# Patient Record
Sex: Female | Born: 1950
Health system: Southern US, Community
[De-identification: ages and names within clinical notes are randomized; demographics above are authoritative.]

## PROBLEM LIST (undated history)

## (undated) DIAGNOSIS — K219 Gastro-esophageal reflux disease without esophagitis: Secondary | ICD-10-CM

## (undated) DIAGNOSIS — E785 Hyperlipidemia, unspecified: Secondary | ICD-10-CM

## (undated) DIAGNOSIS — M25862 Other specified joint disorders, left knee: Secondary | ICD-10-CM

## (undated) DIAGNOSIS — G20A1 Parkinson's disease without dyskinesia, without mention of fluctuations: Secondary | ICD-10-CM

## (undated) DIAGNOSIS — C801 Malignant (primary) neoplasm, unspecified: Secondary | ICD-10-CM

## (undated) DIAGNOSIS — F0390 Unspecified dementia without behavioral disturbance: Secondary | ICD-10-CM

## (undated) DIAGNOSIS — G20 Parkinson's disease: Secondary | ICD-10-CM

## (undated) HISTORY — PX: NASAL SEPTUM SURGERY: SHX37

## (undated) HISTORY — DX: Hyperlipidemia, unspecified: E78.5

## (undated) HISTORY — DX: Other specified joint disorders, left knee: M25.862

## (undated) HISTORY — PX: BUNIONECTOMY: SHX129

## (undated) HISTORY — PX: RETINAL DETACHMENT SURGERY: SHX105

## (undated) HISTORY — PX: BACK SURGERY: SHX140

---

## 2006-04-02 ENCOUNTER — Ambulatory Visit: Payer: Self-pay | Admitting: Ophthalmology

## 2006-08-18 ENCOUNTER — Ambulatory Visit: Payer: Self-pay | Admitting: Ophthalmology

## 2007-02-09 ENCOUNTER — Ambulatory Visit: Payer: Self-pay | Admitting: Ophthalmology

## 2007-06-08 ENCOUNTER — Ambulatory Visit: Payer: Self-pay | Admitting: Ophthalmology

## 2007-11-07 ENCOUNTER — Ambulatory Visit: Payer: Self-pay | Admitting: Family Medicine

## 2009-01-11 ENCOUNTER — Ambulatory Visit: Payer: Self-pay | Admitting: Family Medicine

## 2009-03-07 ENCOUNTER — Ambulatory Visit: Payer: Self-pay

## 2010-06-04 ENCOUNTER — Ambulatory Visit: Payer: Self-pay | Admitting: Ophthalmology

## 2010-12-16 ENCOUNTER — Ambulatory Visit: Payer: Self-pay | Admitting: Unknown Physician Specialty

## 2011-02-17 ENCOUNTER — Ambulatory Visit: Payer: Self-pay

## 2011-02-24 ENCOUNTER — Ambulatory Visit: Payer: Self-pay

## 2011-02-26 LAB — PATHOLOGY REPORT

## 2011-03-09 ENCOUNTER — Ambulatory Visit: Payer: Self-pay | Admitting: Gastroenterology

## 2011-03-10 LAB — PATHOLOGY REPORT

## 2012-06-06 DIAGNOSIS — G20A1 Parkinson's disease without dyskinesia, without mention of fluctuations: Secondary | ICD-10-CM | POA: Insufficient documentation

## 2012-06-06 DIAGNOSIS — G20 Parkinson's disease: Secondary | ICD-10-CM | POA: Insufficient documentation

## 2012-06-06 DIAGNOSIS — E7800 Pure hypercholesterolemia, unspecified: Secondary | ICD-10-CM | POA: Insufficient documentation

## 2012-06-06 DIAGNOSIS — F32A Depression, unspecified: Secondary | ICD-10-CM | POA: Insufficient documentation

## 2012-06-06 DIAGNOSIS — F329 Major depressive disorder, single episode, unspecified: Secondary | ICD-10-CM | POA: Insufficient documentation

## 2012-06-06 DIAGNOSIS — K635 Polyp of colon: Secondary | ICD-10-CM | POA: Insufficient documentation

## 2012-06-06 DIAGNOSIS — H3320 Serous retinal detachment, unspecified eye: Secondary | ICD-10-CM | POA: Insufficient documentation

## 2012-06-06 DIAGNOSIS — F419 Anxiety disorder, unspecified: Secondary | ICD-10-CM | POA: Insufficient documentation

## 2012-06-08 DIAGNOSIS — H9190 Unspecified hearing loss, unspecified ear: Secondary | ICD-10-CM | POA: Insufficient documentation

## 2012-07-10 ENCOUNTER — Ambulatory Visit: Payer: Self-pay | Admitting: Oncology

## 2012-07-15 ENCOUNTER — Ambulatory Visit: Payer: Self-pay | Admitting: Unknown Physician Specialty

## 2012-07-25 ENCOUNTER — Ambulatory Visit: Payer: Self-pay | Admitting: Unknown Physician Specialty

## 2012-07-26 ENCOUNTER — Ambulatory Visit: Payer: Self-pay | Admitting: Unknown Physician Specialty

## 2012-07-28 ENCOUNTER — Ambulatory Visit: Payer: Self-pay | Admitting: Oncology

## 2012-07-28 LAB — PATHOLOGY REPORT

## 2012-08-05 ENCOUNTER — Ambulatory Visit: Payer: Self-pay | Admitting: Oncology

## 2012-08-10 ENCOUNTER — Ambulatory Visit: Payer: Self-pay | Admitting: Oncology

## 2012-08-24 LAB — COMPREHENSIVE METABOLIC PANEL
Albumin: 3.7 g/dL (ref 3.4–5.0)
Alkaline Phosphatase: 113 U/L (ref 50–136)
Anion Gap: 5 — ABNORMAL LOW (ref 7–16)
BUN: 15 mg/dL (ref 7–18)
Bilirubin,Total: 0.6 mg/dL (ref 0.2–1.0)
Calcium, Total: 8.9 mg/dL (ref 8.5–10.1)
Chloride: 105 mmol/L (ref 98–107)
Co2: 31 mmol/L (ref 21–32)
Creatinine: 0.72 mg/dL (ref 0.60–1.30)
EGFR (African American): >60
EGFR (Non-African Amer.): >60
Glucose: 108 mg/dL — ABNORMAL HIGH (ref 65–99)
Osmolality: 283 (ref 275–301)
Potassium: 4.3 mmol/L (ref 3.5–5.1)
SGOT(AST): 21 U/L (ref 15–37)
SGPT (ALT): 16 U/L (ref 12–78)
Sodium: 141 mmol/L (ref 136–145)
Total Protein: 7 g/dL (ref 6.4–8.2)

## 2012-08-24 LAB — CBC CANCER CENTER
Basophil: 1 %
Eosinophil: 2 %
HCT: 45.2 % (ref 35.0–47.0)
HGB: 14.8 g/dL (ref 12.0–16.0)
Lymphocytes: 35 %
MCH: 28.9 pg (ref 26.0–34.0)
MCHC: 32.8 g/dL (ref 32.0–36.0)
MCV: 88 fL (ref 80–100)
Monocytes: 9 %
Platelet: 165 x10 3/mm (ref 150–440)
RBC: 5.13 10*6/uL (ref 3.80–5.20)
RDW: 13.1 % (ref 11.5–14.5)
Segmented Neutrophils: 53 %
WBC: 5.6 x10 3/mm (ref 3.6–11.0)

## 2012-08-31 LAB — CBC CANCER CENTER
Basophil #: 0.1 x10 3/mm (ref 0.0–0.1)
Basophil %: 0.9 %
Eosinophil #: 0.2 x10 3/mm (ref 0.0–0.7)
Eosinophil %: 3.4 %
HCT: 43 % (ref 35.0–47.0)
HGB: 14.5 g/dL (ref 12.0–16.0)
Lymphocyte #: 1.3 x10 3/mm (ref 1.0–3.6)
Lymphocyte %: 19.8 %
MCH: 29.5 pg (ref 26.0–34.0)
MCHC: 33.7 g/dL (ref 32.0–36.0)
MCV: 88 fL (ref 80–100)
Monocyte #: 0.8 x10 3/mm (ref 0.2–0.9)
Monocyte %: 12.4 %
Neutrophil #: 4 x10 3/mm (ref 1.4–6.5)
Neutrophil %: 63.5 %
Platelet: 171 x10 3/mm (ref 150–440)
RBC: 4.9 10*6/uL (ref 3.80–5.20)
RDW: 13.1 % (ref 11.5–14.5)
WBC: 6.4 x10 3/mm (ref 3.6–11.0)

## 2012-09-07 LAB — CBC CANCER CENTER
Basophil #: 0.1 x10 3/mm (ref 0.0–0.1)
Basophil %: 1.1 %
Eosinophil #: 0.1 x10 3/mm (ref 0.0–0.7)
Eosinophil %: 1.7 %
HCT: 44.3 % (ref 35.0–47.0)
HGB: 14.7 g/dL (ref 12.0–16.0)
Lymphocyte #: 1.3 x10 3/mm (ref 1.0–3.6)
Lymphocyte %: 15.8 %
MCH: 29.3 pg (ref 26.0–34.0)
MCHC: 33.2 g/dL (ref 32.0–36.0)
MCV: 88 fL (ref 80–100)
Monocyte #: 0.5 x10 3/mm (ref 0.2–0.9)
Monocyte %: 5.8 %
Neutrophil #: 6.3 x10 3/mm (ref 1.4–6.5)
Neutrophil %: 75.6 %
Platelet: 165 x10 3/mm (ref 150–440)
RBC: 5.02 10*6/uL (ref 3.80–5.20)
RDW: 13.4 % (ref 11.5–14.5)
WBC: 8.3 x10 3/mm (ref 3.6–11.0)

## 2012-09-10 ENCOUNTER — Ambulatory Visit: Payer: Self-pay | Admitting: Oncology

## 2012-09-14 LAB — CBC CANCER CENTER
Basophil #: 0 x10 3/mm (ref 0.0–0.1)
Basophil %: 0.6 %
Eosinophil #: 0.2 x10 3/mm (ref 0.0–0.7)
Eosinophil %: 2.6 %
HCT: 45.7 % (ref 35.0–47.0)
HGB: 15.1 g/dL (ref 12.0–16.0)
Lymphocyte #: 1.3 x10 3/mm (ref 1.0–3.6)
Lymphocyte %: 16.6 %
MCH: 29.3 pg (ref 26.0–34.0)
MCHC: 33.1 g/dL (ref 32.0–36.0)
MCV: 89 fL (ref 80–100)
Monocyte #: 0.7 x10 3/mm (ref 0.2–0.9)
Monocyte %: 9.3 %
Neutrophil #: 5.5 x10 3/mm (ref 1.4–6.5)
Neutrophil %: 70.9 %
Platelet: 192 x10 3/mm (ref 150–440)
RBC: 5.17 10*6/uL (ref 3.80–5.20)
RDW: 13.1 % (ref 11.5–14.5)
WBC: 7.7 x10 3/mm (ref 3.6–11.0)

## 2012-10-08 ENCOUNTER — Ambulatory Visit: Payer: Self-pay | Admitting: Oncology

## 2012-10-26 LAB — CBC CANCER CENTER
Basophil #: 0.2 x10 3/mm — ABNORMAL HIGH (ref 0.0–0.1)
Basophil %: 3.2 %
Eosinophil #: 0.2 x10 3/mm (ref 0.0–0.7)
Eosinophil %: 3.4 %
HCT: 43.1 % (ref 35.0–47.0)
HGB: 14.4 g/dL (ref 12.0–16.0)
Lymphocyte #: 1 x10 3/mm (ref 1.0–3.6)
Lymphocyte %: 21.2 %
MCH: 29.4 pg (ref 26.0–34.0)
MCHC: 33.5 g/dL (ref 32.0–36.0)
MCV: 88 fL (ref 80–100)
Monocyte #: 0.5 x10 3/mm (ref 0.2–0.9)
Monocyte %: 9.7 %
Neutrophil #: 3.1 x10 3/mm (ref 1.4–6.5)
Neutrophil %: 62.5 %
Platelet: 179 x10 3/mm (ref 150–440)
RBC: 4.91 10*6/uL (ref 3.80–5.20)
RDW: 13.4 % (ref 11.5–14.5)
WBC: 4.9 x10 3/mm (ref 3.6–11.0)

## 2012-10-26 LAB — COMPREHENSIVE METABOLIC PANEL
Albumin: 3.6 g/dL (ref 3.4–5.0)
Alkaline Phosphatase: 92 U/L (ref 50–136)
Anion Gap: 7 (ref 7–16)
BUN: 18 mg/dL (ref 7–18)
Bilirubin,Total: 0.4 mg/dL (ref 0.2–1.0)
Calcium, Total: 8.5 mg/dL (ref 8.5–10.1)
Chloride: 106 mmol/L (ref 98–107)
Co2: 29 mmol/L (ref 21–32)
Creatinine: 0.84 mg/dL (ref 0.60–1.30)
EGFR (African American): >60
EGFR (Non-African Amer.): >60
Glucose: 101 mg/dL — ABNORMAL HIGH (ref 65–99)
Osmolality: 285 (ref 275–301)
Potassium: 3.7 mmol/L (ref 3.5–5.1)
SGOT(AST): 21 U/L (ref 15–37)
SGPT (ALT): 12 U/L (ref 12–78)
Sodium: 142 mmol/L (ref 136–145)
Total Protein: 6.7 g/dL (ref 6.4–8.2)

## 2012-10-26 LAB — SEDIMENTATION RATE: Erythrocyte Sed Rate: 1 mm/hr (ref 0–30)

## 2012-11-08 ENCOUNTER — Ambulatory Visit: Payer: Self-pay | Admitting: Oncology

## 2012-11-09 ENCOUNTER — Ambulatory Visit: Payer: Self-pay | Admitting: Oncology

## 2012-11-15 LAB — COMPREHENSIVE METABOLIC PANEL
Albumin: 3.7 g/dL (ref 3.4–5.0)
Alkaline Phosphatase: 112 U/L (ref 50–136)
Anion Gap: 9 (ref 7–16)
BUN: 22 mg/dL — ABNORMAL HIGH (ref 7–18)
Bilirubin,Total: 0.6 mg/dL (ref 0.2–1.0)
Calcium, Total: 8.6 mg/dL (ref 8.5–10.1)
Chloride: 104 mmol/L (ref 98–107)
Co2: 28 mmol/L (ref 21–32)
Creatinine: 0.83 mg/dL (ref 0.60–1.30)
EGFR (African American): >60
EGFR (Non-African Amer.): >60
Glucose: 108 mg/dL — ABNORMAL HIGH (ref 65–99)
Osmolality: 285 (ref 275–301)
Potassium: 4 mmol/L (ref 3.5–5.1)
SGOT(AST): 21 U/L (ref 15–37)
SGPT (ALT): 13 U/L (ref 12–78)
Sodium: 141 mmol/L (ref 136–145)
Total Protein: 6.9 g/dL (ref 6.4–8.2)

## 2012-11-15 LAB — CBC CANCER CENTER
Basophil #: 0.1 x10 3/mm (ref 0.0–0.1)
Basophil %: 1 %
Eosinophil #: 0.3 x10 3/mm (ref 0.0–0.7)
Eosinophil %: 4.2 %
HCT: 44.6 % (ref 35.0–47.0)
HGB: 14.8 g/dL (ref 12.0–16.0)
Lymphocyte #: 1.6 x10 3/mm (ref 1.0–3.6)
Lymphocyte %: 25 %
MCH: 29.3 pg (ref 26.0–34.0)
MCHC: 33.2 g/dL (ref 32.0–36.0)
MCV: 88 fL (ref 80–100)
Monocyte #: 0.6 x10 3/mm (ref 0.2–0.9)
Monocyte %: 9.4 %
Neutrophil #: 3.9 x10 3/mm (ref 1.4–6.5)
Neutrophil %: 60.4 %
Platelet: 181 x10 3/mm (ref 150–440)
RBC: 5.05 10*6/uL (ref 3.80–5.20)
RDW: 13.5 % (ref 11.5–14.5)
WBC: 6.4 x10 3/mm (ref 3.6–11.0)

## 2012-11-29 ENCOUNTER — Ambulatory Visit: Payer: Self-pay | Admitting: Unknown Physician Specialty

## 2012-12-08 ENCOUNTER — Ambulatory Visit: Payer: Self-pay | Admitting: Oncology

## 2013-01-26 ENCOUNTER — Ambulatory Visit: Payer: Self-pay | Admitting: Oncology

## 2013-01-31 LAB — COMPREHENSIVE METABOLIC PANEL
Albumin: 3.8 g/dL (ref 3.4–5.0)
Alkaline Phosphatase: 118 U/L (ref 50–136)
Anion Gap: 9 (ref 7–16)
BUN: 18 mg/dL (ref 7–18)
Bilirubin,Total: 0.6 mg/dL (ref 0.2–1.0)
Calcium, Total: 8.9 mg/dL (ref 8.5–10.1)
Chloride: 107 mmol/L (ref 98–107)
Co2: 28 mmol/L (ref 21–32)
Creatinine: 0.92 mg/dL (ref 0.60–1.30)
EGFR (African American): >60
EGFR (Non-African Amer.): >60
Glucose: 112 mg/dL — ABNORMAL HIGH (ref 65–99)
Osmolality: 289 (ref 275–301)
Potassium: 4.1 mmol/L (ref 3.5–5.1)
SGOT(AST): 20 U/L (ref 15–37)
SGPT (ALT): 16 U/L (ref 12–78)
Sodium: 144 mmol/L (ref 136–145)
Total Protein: 7 g/dL (ref 6.4–8.2)

## 2013-01-31 LAB — CBC CANCER CENTER
Basophil #: 0 x10 3/mm (ref 0.0–0.1)
Basophil %: 0.9 %
Eosinophil #: 0.2 x10 3/mm (ref 0.0–0.7)
Eosinophil %: 3.2 %
HCT: 44.5 % (ref 35.0–47.0)
HGB: 15 g/dL (ref 12.0–16.0)
Lymphocyte #: 1.2 x10 3/mm (ref 1.0–3.6)
Lymphocyte %: 22.5 %
MCH: 30 pg (ref 26.0–34.0)
MCHC: 33.8 g/dL (ref 32.0–36.0)
MCV: 89 fL (ref 80–100)
Monocyte #: 0.5 x10 3/mm (ref 0.2–0.9)
Monocyte %: 9.1 %
Neutrophil #: 3.5 x10 3/mm (ref 1.4–6.5)
Neutrophil %: 64.3 %
Platelet: 180 x10 3/mm (ref 150–440)
RBC: 5.01 10*6/uL (ref 3.80–5.20)
RDW: 13.3 % (ref 11.5–14.5)
WBC: 5.4 x10 3/mm (ref 3.6–11.0)

## 2013-01-31 LAB — LACTATE DEHYDROGENASE: LDH: 159 U/L (ref 81–246)

## 2013-02-07 ENCOUNTER — Ambulatory Visit: Payer: Self-pay | Admitting: Oncology

## 2013-03-31 ENCOUNTER — Ambulatory Visit: Payer: Self-pay | Admitting: Oncology

## 2013-04-03 LAB — CBC CANCER CENTER
Basophil #: 0 x10 3/mm (ref 0.0–0.1)
Basophil %: 0.4 %
Eosinophil #: 0.2 x10 3/mm (ref 0.0–0.7)
Eosinophil %: 3.2 %
HCT: 43.1 % (ref 35.0–47.0)
HGB: 14.7 g/dL (ref 12.0–16.0)
Lymphocyte #: 1.1 x10 3/mm (ref 1.0–3.6)
Lymphocyte %: 18.4 %
MCH: 29.9 pg (ref 26.0–34.0)
MCHC: 34.1 g/dL (ref 32.0–36.0)
MCV: 88 fL (ref 80–100)
Monocyte #: 0.6 x10 3/mm (ref 0.2–0.9)
Monocyte %: 9.4 %
Neutrophil #: 4 x10 3/mm (ref 1.4–6.5)
Neutrophil %: 68.6 %
Platelet: 179 x10 3/mm (ref 150–440)
RBC: 4.92 10*6/uL (ref 3.80–5.20)
RDW: 12.9 % (ref 11.5–14.5)
WBC: 5.8 x10 3/mm (ref 3.6–11.0)

## 2013-04-03 LAB — COMPREHENSIVE METABOLIC PANEL
Albumin: 3.6 g/dL (ref 3.4–5.0)
Alkaline Phosphatase: 103 U/L (ref 50–136)
Anion Gap: 5 — ABNORMAL LOW (ref 7–16)
BUN: 18 mg/dL (ref 7–18)
Bilirubin,Total: 0.7 mg/dL (ref 0.2–1.0)
Calcium, Total: 8.8 mg/dL (ref 8.5–10.1)
Chloride: 113 mmol/L — ABNORMAL HIGH (ref 98–107)
Co2: 27 mmol/L (ref 21–32)
Creatinine: 0.65 mg/dL (ref 0.60–1.30)
EGFR (African American): >60
EGFR (Non-African Amer.): >60
Glucose: 94 mg/dL (ref 65–99)
Osmolality: 290 (ref 275–301)
Potassium: 3.8 mmol/L (ref 3.5–5.1)
SGOT(AST): 16 U/L (ref 15–37)
SGPT (ALT): 12 U/L (ref 12–78)
Sodium: 145 mmol/L (ref 136–145)
Total Protein: 6.7 g/dL (ref 6.4–8.2)

## 2013-04-03 LAB — LACTATE DEHYDROGENASE: LDH: 168 U/L (ref 81–246)

## 2013-04-10 ENCOUNTER — Ambulatory Visit: Payer: Self-pay | Admitting: Oncology

## 2013-05-10 ENCOUNTER — Ambulatory Visit: Payer: Self-pay | Admitting: Oncology

## 2013-06-07 LAB — SEDIMENTATION RATE: Erythrocyte Sed Rate: 4 mm/hr (ref 0–30)

## 2013-06-07 LAB — COMPREHENSIVE METABOLIC PANEL
Albumin: 3.6 g/dL (ref 3.4–5.0)
Alkaline Phosphatase: 109 U/L (ref 50–136)
Anion Gap: 10 (ref 7–16)
BUN: 17 mg/dL (ref 7–18)
Bilirubin,Total: 0.7 mg/dL (ref 0.2–1.0)
Calcium, Total: 8.3 mg/dL — ABNORMAL LOW (ref 8.5–10.1)
Chloride: 107 mmol/L (ref 98–107)
Co2: 28 mmol/L (ref 21–32)
Creatinine: 0.75 mg/dL (ref 0.60–1.30)
EGFR (African American): >60
EGFR (Non-African Amer.): >60
Glucose: 97 mg/dL (ref 65–99)
Osmolality: 290 (ref 275–301)
Potassium: 3.7 mmol/L (ref 3.5–5.1)
SGOT(AST): 20 U/L (ref 15–37)
SGPT (ALT): 19 U/L (ref 12–78)
Sodium: 145 mmol/L (ref 136–145)
Total Protein: 6.8 g/dL (ref 6.4–8.2)

## 2013-06-07 LAB — CBC CANCER CENTER
Basophil #: 0 x10 3/mm (ref 0.0–0.1)
Basophil %: 0.6 %
Eosinophil #: 0.2 x10 3/mm (ref 0.0–0.7)
Eosinophil %: 3.5 %
HCT: 44 % (ref 35.0–47.0)
HGB: 14.4 g/dL (ref 12.0–16.0)
Lymphocyte #: 1.2 x10 3/mm (ref 1.0–3.6)
Lymphocyte %: 24.4 %
MCH: 29.3 pg (ref 26.0–34.0)
MCHC: 32.7 g/dL (ref 32.0–36.0)
MCV: 90 fL (ref 80–100)
Monocyte #: 0.5 x10 3/mm (ref 0.2–0.9)
Monocyte %: 10.1 %
Neutrophil #: 3 x10 3/mm (ref 1.4–6.5)
Neutrophil %: 61.4 %
Platelet: 169 x10 3/mm (ref 150–440)
RBC: 4.92 10*6/uL (ref 3.80–5.20)
RDW: 13.1 % (ref 11.5–14.5)
WBC: 4.8 x10 3/mm (ref 3.6–11.0)

## 2013-06-07 LAB — LACTATE DEHYDROGENASE: LDH: 164 U/L (ref 81–246)

## 2013-06-10 ENCOUNTER — Ambulatory Visit: Payer: Self-pay | Admitting: Oncology

## 2013-06-26 ENCOUNTER — Ambulatory Visit: Payer: Self-pay | Admitting: Unknown Physician Specialty

## 2013-07-27 ENCOUNTER — Ambulatory Visit: Payer: Self-pay | Admitting: Internal Medicine

## 2013-08-09 ENCOUNTER — Ambulatory Visit: Payer: Self-pay | Admitting: Oncology

## 2013-08-09 LAB — COMPREHENSIVE METABOLIC PANEL
Albumin: 3.6 g/dL (ref 3.4–5.0)
Alkaline Phosphatase: 113 U/L
Anion Gap: 6 — ABNORMAL LOW (ref 7–16)
BUN: 16 mg/dL (ref 7–18)
Bilirubin,Total: 0.4 mg/dL (ref 0.2–1.0)
Calcium, Total: 8.9 mg/dL (ref 8.5–10.1)
Chloride: 107 mmol/L (ref 98–107)
Co2: 28 mmol/L (ref 21–32)
Creatinine: 0.74 mg/dL (ref 0.60–1.30)
EGFR (African American): >60
EGFR (Non-African Amer.): >60
Glucose: 111 mg/dL — ABNORMAL HIGH (ref 65–99)
Osmolality: 283 (ref 275–301)
Potassium: 4.2 mmol/L (ref 3.5–5.1)
SGOT(AST): 19 U/L (ref 15–37)
SGPT (ALT): 14 U/L (ref 12–78)
Sodium: 141 mmol/L (ref 136–145)
Total Protein: 6.6 g/dL (ref 6.4–8.2)

## 2013-08-09 LAB — CBC CANCER CENTER
Basophil #: 0 x10 3/mm (ref 0.0–0.1)
Basophil %: 0.8 %
Eosinophil #: 0.2 x10 3/mm (ref 0.0–0.7)
Eosinophil %: 3.8 %
HCT: 45.9 % (ref 35.0–47.0)
HGB: 15 g/dL (ref 12.0–16.0)
Lymphocyte #: 1.2 x10 3/mm (ref 1.0–3.6)
Lymphocyte %: 24.2 %
MCH: 29.1 pg (ref 26.0–34.0)
MCHC: 32.7 g/dL (ref 32.0–36.0)
MCV: 89 fL (ref 80–100)
Monocyte #: 0.5 x10 3/mm (ref 0.2–0.9)
Monocyte %: 10.4 %
Neutrophil #: 3 x10 3/mm (ref 1.4–6.5)
Neutrophil %: 60.8 %
Platelet: 180 x10 3/mm (ref 150–440)
RBC: 5.17 10*6/uL (ref 3.80–5.20)
RDW: 12.9 % (ref 11.5–14.5)
WBC: 4.9 x10 3/mm (ref 3.6–11.0)

## 2013-08-09 LAB — LACTATE DEHYDROGENASE: LDH: 158 U/L (ref 81–246)

## 2013-08-10 ENCOUNTER — Ambulatory Visit: Payer: Self-pay | Admitting: Oncology

## 2013-10-03 ENCOUNTER — Ambulatory Visit: Payer: Self-pay | Admitting: Oncology

## 2013-10-04 LAB — CBC CANCER CENTER
Basophil #: 0.1 x10 3/mm (ref 0.0–0.1)
Basophil %: 0.9 %
Eosinophil #: 0.2 x10 3/mm (ref 0.0–0.7)
Eosinophil %: 3.1 %
HCT: 46.5 % (ref 35.0–47.0)
HGB: 15 g/dL (ref 12.0–16.0)
Lymphocyte #: 1.2 x10 3/mm (ref 1.0–3.6)
Lymphocyte %: 20.1 %
MCH: 28.8 pg (ref 26.0–34.0)
MCHC: 32.4 g/dL (ref 32.0–36.0)
MCV: 89 fL (ref 80–100)
Monocyte #: 0.5 x10 3/mm (ref 0.2–0.9)
Monocyte %: 9.2 %
Neutrophil #: 3.8 x10 3/mm (ref 1.4–6.5)
Neutrophil %: 66.7 %
Platelet: 187 x10 3/mm (ref 150–440)
RBC: 5.22 10*6/uL — ABNORMAL HIGH (ref 3.80–5.20)
RDW: 12.9 % (ref 11.5–14.5)
WBC: 5.7 x10 3/mm (ref 3.6–11.0)

## 2013-10-04 LAB — COMPREHENSIVE METABOLIC PANEL
Albumin: 3.8 g/dL (ref 3.4–5.0)
Alkaline Phosphatase: 105 U/L
Anion Gap: 7 (ref 7–16)
BUN: 16 mg/dL (ref 7–18)
Bilirubin,Total: 0.4 mg/dL (ref 0.2–1.0)
Calcium, Total: 8.7 mg/dL (ref 8.5–10.1)
Chloride: 105 mmol/L (ref 98–107)
Co2: 29 mmol/L (ref 21–32)
Creatinine: 0.76 mg/dL (ref 0.60–1.30)
EGFR (African American): >60
EGFR (Non-African Amer.): >60
Glucose: 106 mg/dL — ABNORMAL HIGH (ref 65–99)
Osmolality: 283 (ref 275–301)
Potassium: 3.9 mmol/L (ref 3.5–5.1)
SGOT(AST): 16 U/L (ref 15–37)
SGPT (ALT): 12 U/L (ref 12–78)
Sodium: 141 mmol/L (ref 136–145)
Total Protein: 6.9 g/dL (ref 6.4–8.2)

## 2013-10-04 LAB — LACTATE DEHYDROGENASE: LDH: 167 U/L (ref 81–246)

## 2013-10-08 ENCOUNTER — Ambulatory Visit: Payer: Self-pay | Admitting: Oncology

## 2013-11-16 ENCOUNTER — Ambulatory Visit: Payer: Self-pay | Admitting: Internal Medicine

## 2013-11-16 DIAGNOSIS — M7120 Synovial cyst of popliteal space [Baker], unspecified knee: Secondary | ICD-10-CM | POA: Diagnosis not present

## 2013-11-16 DIAGNOSIS — M7989 Other specified soft tissue disorders: Secondary | ICD-10-CM | POA: Diagnosis not present

## 2013-11-16 DIAGNOSIS — M25569 Pain in unspecified knee: Secondary | ICD-10-CM | POA: Diagnosis not present

## 2013-11-30 DIAGNOSIS — C8190 Hodgkin lymphoma, unspecified, unspecified site: Secondary | ICD-10-CM | POA: Diagnosis not present

## 2013-11-30 DIAGNOSIS — W57XXXA Bitten or stung by nonvenomous insect and other nonvenomous arthropods, initial encounter: Secondary | ICD-10-CM | POA: Diagnosis not present

## 2013-11-30 DIAGNOSIS — S30860A Insect bite (nonvenomous) of lower back and pelvis, initial encounter: Secondary | ICD-10-CM | POA: Diagnosis not present

## 2013-11-30 DIAGNOSIS — G20 Parkinson's disease: Secondary | ICD-10-CM | POA: Diagnosis not present

## 2013-12-05 DIAGNOSIS — M62838 Other muscle spasm: Secondary | ICD-10-CM | POA: Diagnosis not present

## 2013-12-05 DIAGNOSIS — M9981 Other biomechanical lesions of cervical region: Secondary | ICD-10-CM | POA: Diagnosis not present

## 2013-12-05 DIAGNOSIS — M5412 Radiculopathy, cervical region: Secondary | ICD-10-CM | POA: Diagnosis not present

## 2013-12-05 DIAGNOSIS — M999 Biomechanical lesion, unspecified: Secondary | ICD-10-CM | POA: Diagnosis not present

## 2013-12-06 ENCOUNTER — Ambulatory Visit: Payer: Self-pay | Admitting: Oncology

## 2013-12-06 DIAGNOSIS — H9190 Unspecified hearing loss, unspecified ear: Secondary | ICD-10-CM | POA: Diagnosis not present

## 2013-12-06 DIAGNOSIS — Z79899 Other long term (current) drug therapy: Secondary | ICD-10-CM | POA: Diagnosis not present

## 2013-12-06 DIAGNOSIS — C8291 Follicular lymphoma, unspecified, lymph nodes of head, face, and neck: Secondary | ICD-10-CM | POA: Diagnosis not present

## 2013-12-06 DIAGNOSIS — G20 Parkinson's disease: Secondary | ICD-10-CM | POA: Diagnosis not present

## 2013-12-06 DIAGNOSIS — Z8049 Family history of malignant neoplasm of other genital organs: Secondary | ICD-10-CM | POA: Diagnosis not present

## 2013-12-06 DIAGNOSIS — E7800 Pure hypercholesterolemia, unspecified: Secondary | ICD-10-CM | POA: Diagnosis not present

## 2013-12-06 DIAGNOSIS — Z86010 Personal history of colonic polyps: Secondary | ICD-10-CM | POA: Diagnosis not present

## 2013-12-06 DIAGNOSIS — R5383 Other fatigue: Secondary | ICD-10-CM | POA: Diagnosis not present

## 2013-12-06 DIAGNOSIS — M7989 Other specified soft tissue disorders: Secondary | ICD-10-CM | POA: Diagnosis not present

## 2013-12-06 DIAGNOSIS — Z5111 Encounter for antineoplastic chemotherapy: Secondary | ICD-10-CM | POA: Diagnosis not present

## 2013-12-06 DIAGNOSIS — R5381 Other malaise: Secondary | ICD-10-CM | POA: Diagnosis not present

## 2013-12-06 DIAGNOSIS — F411 Generalized anxiety disorder: Secondary | ICD-10-CM | POA: Diagnosis not present

## 2013-12-06 DIAGNOSIS — G8929 Other chronic pain: Secondary | ICD-10-CM | POA: Diagnosis not present

## 2013-12-06 LAB — COMPREHENSIVE METABOLIC PANEL
Albumin: 3.6 g/dL (ref 3.4–5.0)
Alkaline Phosphatase: 108 U/L
Anion Gap: 8 (ref 7–16)
BUN: 16 mg/dL (ref 7–18)
Bilirubin,Total: 0.6 mg/dL (ref 0.2–1.0)
Calcium, Total: 9 mg/dL (ref 8.5–10.1)
Chloride: 106 mmol/L (ref 98–107)
Co2: 30 mmol/L (ref 21–32)
Creatinine: 0.77 mg/dL (ref 0.60–1.30)
EGFR (African American): >60
EGFR (Non-African Amer.): >60
Glucose: 103 mg/dL — ABNORMAL HIGH (ref 65–99)
Osmolality: 288 (ref 275–301)
Potassium: 4.1 mmol/L (ref 3.5–5.1)
SGOT(AST): 15 U/L (ref 15–37)
SGPT (ALT): 12 U/L (ref 12–78)
Sodium: 144 mmol/L (ref 136–145)
Total Protein: 6.6 g/dL (ref 6.4–8.2)

## 2013-12-06 LAB — CBC CANCER CENTER
Basophil #: 0.1 x10 3/mm (ref 0.0–0.1)
Basophil %: 1.6 %
Eosinophil #: 0.1 x10 3/mm (ref 0.0–0.7)
Eosinophil %: 2.4 %
HCT: 43.3 % (ref 35.0–47.0)
HGB: 14.5 g/dL (ref 12.0–16.0)
Lymphocyte #: 1 x10 3/mm (ref 1.0–3.6)
Lymphocyte %: 21.7 %
MCH: 29.4 pg (ref 26.0–34.0)
MCHC: 33.6 g/dL (ref 32.0–36.0)
MCV: 88 fL (ref 80–100)
Monocyte #: 0.5 x10 3/mm (ref 0.2–0.9)
Monocyte %: 10.9 %
Neutrophil #: 3 x10 3/mm (ref 1.4–6.5)
Neutrophil %: 63.4 %
Platelet: 170 x10 3/mm (ref 150–440)
RBC: 4.94 10*6/uL (ref 3.80–5.20)
RDW: 13.3 % (ref 11.5–14.5)
WBC: 4.7 x10 3/mm (ref 3.6–11.0)

## 2013-12-06 LAB — LACTATE DEHYDROGENASE: LDH: 153 U/L (ref 81–246)

## 2013-12-08 ENCOUNTER — Ambulatory Visit: Payer: Self-pay | Admitting: Oncology

## 2013-12-19 DIAGNOSIS — M9981 Other biomechanical lesions of cervical region: Secondary | ICD-10-CM | POA: Diagnosis not present

## 2013-12-19 DIAGNOSIS — M999 Biomechanical lesion, unspecified: Secondary | ICD-10-CM | POA: Diagnosis not present

## 2013-12-19 DIAGNOSIS — M62838 Other muscle spasm: Secondary | ICD-10-CM | POA: Diagnosis not present

## 2013-12-19 DIAGNOSIS — M5412 Radiculopathy, cervical region: Secondary | ICD-10-CM | POA: Diagnosis not present

## 2013-12-26 ENCOUNTER — Ambulatory Visit: Payer: Self-pay | Admitting: Unknown Physician Specialty

## 2013-12-26 DIAGNOSIS — E042 Nontoxic multinodular goiter: Secondary | ICD-10-CM | POA: Diagnosis not present

## 2013-12-26 DIAGNOSIS — H35379 Puckering of macula, unspecified eye: Secondary | ICD-10-CM | POA: Diagnosis not present

## 2013-12-26 DIAGNOSIS — E041 Nontoxic single thyroid nodule: Secondary | ICD-10-CM | POA: Diagnosis not present

## 2014-01-16 DIAGNOSIS — M62838 Other muscle spasm: Secondary | ICD-10-CM | POA: Diagnosis not present

## 2014-01-16 DIAGNOSIS — M9981 Other biomechanical lesions of cervical region: Secondary | ICD-10-CM | POA: Diagnosis not present

## 2014-01-16 DIAGNOSIS — M999 Biomechanical lesion, unspecified: Secondary | ICD-10-CM | POA: Diagnosis not present

## 2014-01-16 DIAGNOSIS — M5412 Radiculopathy, cervical region: Secondary | ICD-10-CM | POA: Diagnosis not present

## 2014-01-22 DIAGNOSIS — M62838 Other muscle spasm: Secondary | ICD-10-CM | POA: Diagnosis not present

## 2014-01-22 DIAGNOSIS — M999 Biomechanical lesion, unspecified: Secondary | ICD-10-CM | POA: Diagnosis not present

## 2014-01-22 DIAGNOSIS — M9981 Other biomechanical lesions of cervical region: Secondary | ICD-10-CM | POA: Diagnosis not present

## 2014-01-22 DIAGNOSIS — M5412 Radiculopathy, cervical region: Secondary | ICD-10-CM | POA: Diagnosis not present

## 2014-01-25 DIAGNOSIS — G4752 REM sleep behavior disorder: Secondary | ICD-10-CM | POA: Insufficient documentation

## 2014-01-25 DIAGNOSIS — G20 Parkinson's disease: Secondary | ICD-10-CM | POA: Diagnosis not present

## 2014-01-25 DIAGNOSIS — R251 Tremor, unspecified: Secondary | ICD-10-CM | POA: Insufficient documentation

## 2014-01-25 DIAGNOSIS — G479 Sleep disorder, unspecified: Secondary | ICD-10-CM | POA: Diagnosis not present

## 2014-01-25 DIAGNOSIS — M549 Dorsalgia, unspecified: Secondary | ICD-10-CM | POA: Insufficient documentation

## 2014-01-25 DIAGNOSIS — E669 Obesity, unspecified: Secondary | ICD-10-CM | POA: Insufficient documentation

## 2014-02-01 ENCOUNTER — Ambulatory Visit: Payer: Self-pay | Admitting: Oncology

## 2014-02-01 DIAGNOSIS — K449 Diaphragmatic hernia without obstruction or gangrene: Secondary | ICD-10-CM | POA: Diagnosis not present

## 2014-02-01 DIAGNOSIS — C8298 Follicular lymphoma, unspecified, lymph nodes of multiple sites: Secondary | ICD-10-CM | POA: Diagnosis not present

## 2014-02-01 DIAGNOSIS — I2510 Atherosclerotic heart disease of native coronary artery without angina pectoris: Secondary | ICD-10-CM | POA: Diagnosis not present

## 2014-02-01 DIAGNOSIS — C8589 Other specified types of non-Hodgkin lymphoma, extranodal and solid organ sites: Secondary | ICD-10-CM | POA: Diagnosis not present

## 2014-02-01 DIAGNOSIS — K5730 Diverticulosis of large intestine without perforation or abscess without bleeding: Secondary | ICD-10-CM | POA: Diagnosis not present

## 2014-02-05 ENCOUNTER — Ambulatory Visit: Payer: Self-pay | Admitting: Oncology

## 2014-02-05 DIAGNOSIS — M999 Biomechanical lesion, unspecified: Secondary | ICD-10-CM | POA: Diagnosis not present

## 2014-02-05 DIAGNOSIS — H9190 Unspecified hearing loss, unspecified ear: Secondary | ICD-10-CM | POA: Diagnosis not present

## 2014-02-05 DIAGNOSIS — M62838 Other muscle spasm: Secondary | ICD-10-CM | POA: Diagnosis not present

## 2014-02-05 DIAGNOSIS — M549 Dorsalgia, unspecified: Secondary | ICD-10-CM | POA: Diagnosis not present

## 2014-02-05 DIAGNOSIS — R5381 Other malaise: Secondary | ICD-10-CM | POA: Diagnosis not present

## 2014-02-05 DIAGNOSIS — R52 Pain, unspecified: Secondary | ICD-10-CM | POA: Diagnosis not present

## 2014-02-05 DIAGNOSIS — Z86010 Personal history of colonic polyps: Secondary | ICD-10-CM | POA: Diagnosis not present

## 2014-02-05 DIAGNOSIS — M7989 Other specified soft tissue disorders: Secondary | ICD-10-CM | POA: Diagnosis not present

## 2014-02-05 DIAGNOSIS — C8291 Follicular lymphoma, unspecified, lymph nodes of head, face, and neck: Secondary | ICD-10-CM | POA: Diagnosis not present

## 2014-02-05 DIAGNOSIS — M5412 Radiculopathy, cervical region: Secondary | ICD-10-CM | POA: Diagnosis not present

## 2014-02-05 DIAGNOSIS — Z5111 Encounter for antineoplastic chemotherapy: Secondary | ICD-10-CM | POA: Diagnosis not present

## 2014-02-05 DIAGNOSIS — Z79899 Other long term (current) drug therapy: Secondary | ICD-10-CM | POA: Diagnosis not present

## 2014-02-05 DIAGNOSIS — G20 Parkinson's disease: Secondary | ICD-10-CM | POA: Diagnosis not present

## 2014-02-05 DIAGNOSIS — Z87891 Personal history of nicotine dependence: Secondary | ICD-10-CM | POA: Diagnosis not present

## 2014-02-05 DIAGNOSIS — M9981 Other biomechanical lesions of cervical region: Secondary | ICD-10-CM | POA: Diagnosis not present

## 2014-02-05 DIAGNOSIS — F411 Generalized anxiety disorder: Secondary | ICD-10-CM | POA: Diagnosis not present

## 2014-02-05 LAB — COMPREHENSIVE METABOLIC PANEL
Albumin: 3.4 g/dL (ref 3.4–5.0)
Alkaline Phosphatase: 94 U/L
Anion Gap: 10 (ref 7–16)
BUN: 21 mg/dL — ABNORMAL HIGH (ref 7–18)
Bilirubin,Total: 0.5 mg/dL (ref 0.2–1.0)
Calcium, Total: 8.8 mg/dL (ref 8.5–10.1)
Chloride: 107 mmol/L (ref 98–107)
Co2: 29 mmol/L (ref 21–32)
Creatinine: 0.74 mg/dL (ref 0.60–1.30)
EGFR (African American): >60
EGFR (Non-African Amer.): >60
Glucose: 109 mg/dL — ABNORMAL HIGH (ref 65–99)
Osmolality: 294 (ref 275–301)
Potassium: 3.5 mmol/L (ref 3.5–5.1)
SGOT(AST): 15 U/L (ref 15–37)
SGPT (ALT): 9 U/L — ABNORMAL LOW (ref 12–78)
Sodium: 146 mmol/L — ABNORMAL HIGH (ref 136–145)
Total Protein: 6.6 g/dL (ref 6.4–8.2)

## 2014-02-05 LAB — CBC CANCER CENTER
Basophil #: 0 x10 3/mm (ref 0.0–0.1)
Basophil %: 0.7 %
Eosinophil #: 0.1 x10 3/mm (ref 0.0–0.7)
Eosinophil %: 2.3 %
HCT: 44.2 % (ref 35.0–47.0)
HGB: 14.5 g/dL (ref 12.0–16.0)
Lymphocyte #: 1.2 x10 3/mm (ref 1.0–3.6)
Lymphocyte %: 20 %
MCH: 29.1 pg (ref 26.0–34.0)
MCHC: 32.8 g/dL (ref 32.0–36.0)
MCV: 89 fL (ref 80–100)
Monocyte #: 0.5 x10 3/mm (ref 0.2–0.9)
Monocyte %: 8.5 %
Neutrophil #: 4 x10 3/mm (ref 1.4–6.5)
Neutrophil %: 68.5 %
Platelet: 175 x10 3/mm (ref 150–440)
RBC: 4.97 10*6/uL (ref 3.80–5.20)
RDW: 13.3 % (ref 11.5–14.5)
WBC: 5.8 x10 3/mm (ref 3.6–11.0)

## 2014-02-05 LAB — LACTATE DEHYDROGENASE: LDH: 167 U/L (ref 81–246)

## 2014-02-07 ENCOUNTER — Ambulatory Visit: Payer: Self-pay | Admitting: Oncology

## 2014-02-07 DIAGNOSIS — M9981 Other biomechanical lesions of cervical region: Secondary | ICD-10-CM | POA: Diagnosis not present

## 2014-02-07 DIAGNOSIS — M999 Biomechanical lesion, unspecified: Secondary | ICD-10-CM | POA: Diagnosis not present

## 2014-02-07 DIAGNOSIS — M5412 Radiculopathy, cervical region: Secondary | ICD-10-CM | POA: Diagnosis not present

## 2014-02-07 DIAGNOSIS — M62838 Other muscle spasm: Secondary | ICD-10-CM | POA: Diagnosis not present

## 2014-03-16 DIAGNOSIS — H1030 Unspecified acute conjunctivitis, unspecified eye: Secondary | ICD-10-CM | POA: Diagnosis not present

## 2014-03-30 ENCOUNTER — Ambulatory Visit: Payer: Self-pay | Admitting: Oncology

## 2014-03-30 DIAGNOSIS — C8291 Follicular lymphoma, unspecified, lymph nodes of head, face, and neck: Secondary | ICD-10-CM | POA: Diagnosis not present

## 2014-03-30 DIAGNOSIS — S6980XA Other specified injuries of unspecified wrist, hand and finger(s), initial encounter: Secondary | ICD-10-CM | POA: Diagnosis not present

## 2014-03-30 DIAGNOSIS — Z8049 Family history of malignant neoplasm of other genital organs: Secondary | ICD-10-CM | POA: Diagnosis not present

## 2014-03-30 DIAGNOSIS — Z86010 Personal history of colonic polyps: Secondary | ICD-10-CM | POA: Diagnosis not present

## 2014-03-30 DIAGNOSIS — S6990XA Unspecified injury of unspecified wrist, hand and finger(s), initial encounter: Secondary | ICD-10-CM | POA: Diagnosis not present

## 2014-03-30 DIAGNOSIS — G8929 Other chronic pain: Secondary | ICD-10-CM | POA: Diagnosis not present

## 2014-03-30 DIAGNOSIS — H9190 Unspecified hearing loss, unspecified ear: Secondary | ICD-10-CM | POA: Diagnosis not present

## 2014-03-30 DIAGNOSIS — M545 Low back pain, unspecified: Secondary | ICD-10-CM | POA: Diagnosis not present

## 2014-03-30 DIAGNOSIS — Z87891 Personal history of nicotine dependence: Secondary | ICD-10-CM | POA: Diagnosis not present

## 2014-03-30 DIAGNOSIS — R5381 Other malaise: Secondary | ICD-10-CM | POA: Diagnosis not present

## 2014-03-30 DIAGNOSIS — Z79899 Other long term (current) drug therapy: Secondary | ICD-10-CM | POA: Diagnosis not present

## 2014-03-30 DIAGNOSIS — H1089 Other conjunctivitis: Secondary | ICD-10-CM | POA: Diagnosis not present

## 2014-03-30 DIAGNOSIS — G20 Parkinson's disease: Secondary | ICD-10-CM | POA: Diagnosis not present

## 2014-03-30 DIAGNOSIS — E7800 Pure hypercholesterolemia, unspecified: Secondary | ICD-10-CM | POA: Diagnosis not present

## 2014-03-30 DIAGNOSIS — Z5111 Encounter for antineoplastic chemotherapy: Secondary | ICD-10-CM | POA: Diagnosis not present

## 2014-04-02 DIAGNOSIS — R5383 Other fatigue: Secondary | ICD-10-CM | POA: Diagnosis not present

## 2014-04-02 DIAGNOSIS — Z5111 Encounter for antineoplastic chemotherapy: Secondary | ICD-10-CM | POA: Diagnosis not present

## 2014-04-02 DIAGNOSIS — S6990XA Unspecified injury of unspecified wrist, hand and finger(s), initial encounter: Secondary | ICD-10-CM | POA: Diagnosis not present

## 2014-04-02 DIAGNOSIS — C8291 Follicular lymphoma, unspecified, lymph nodes of head, face, and neck: Secondary | ICD-10-CM | POA: Diagnosis not present

## 2014-04-02 DIAGNOSIS — G20 Parkinson's disease: Secondary | ICD-10-CM | POA: Diagnosis not present

## 2014-04-02 DIAGNOSIS — H1089 Other conjunctivitis: Secondary | ICD-10-CM | POA: Diagnosis not present

## 2014-04-02 DIAGNOSIS — S6980XA Other specified injuries of unspecified wrist, hand and finger(s), initial encounter: Secondary | ICD-10-CM | POA: Diagnosis not present

## 2014-04-02 DIAGNOSIS — R5381 Other malaise: Secondary | ICD-10-CM | POA: Diagnosis not present

## 2014-04-02 LAB — COMPREHENSIVE METABOLIC PANEL
Albumin: 3.7 g/dL (ref 3.4–5.0)
Alkaline Phosphatase: 96 U/L
Anion Gap: 7 (ref 7–16)
BUN: 22 mg/dL — ABNORMAL HIGH (ref 7–18)
Bilirubin,Total: 0.6 mg/dL (ref 0.2–1.0)
Calcium, Total: 8.5 mg/dL (ref 8.5–10.1)
Chloride: 108 mmol/L — ABNORMAL HIGH (ref 98–107)
Co2: 28 mmol/L (ref 21–32)
Creatinine: 0.72 mg/dL (ref 0.60–1.30)
EGFR (African American): >60
EGFR (Non-African Amer.): >60
Glucose: 101 mg/dL — ABNORMAL HIGH (ref 65–99)
Osmolality: 288 (ref 275–301)
Potassium: 3.4 mmol/L — ABNORMAL LOW (ref 3.5–5.1)
SGOT(AST): 16 U/L (ref 15–37)
SGPT (ALT): 11 U/L — ABNORMAL LOW
Sodium: 143 mmol/L (ref 136–145)
Total Protein: 6.6 g/dL (ref 6.4–8.2)

## 2014-04-02 LAB — CBC CANCER CENTER
Basophil #: 0 x10 3/mm (ref 0.0–0.1)
Basophil %: 1 %
Eosinophil #: 0.1 x10 3/mm (ref 0.0–0.7)
Eosinophil %: 2.6 %
HCT: 45.6 % (ref 35.0–47.0)
HGB: 14.9 g/dL (ref 12.0–16.0)
Lymphocyte #: 1 x10 3/mm (ref 1.0–3.6)
Lymphocyte %: 20.6 %
MCH: 29.3 pg (ref 26.0–34.0)
MCHC: 32.7 g/dL (ref 32.0–36.0)
MCV: 90 fL (ref 80–100)
Monocyte #: 0.5 x10 3/mm (ref 0.2–0.9)
Monocyte %: 10.5 %
Neutrophil #: 3.3 x10 3/mm (ref 1.4–6.5)
Neutrophil %: 65.3 %
Platelet: 183 x10 3/mm (ref 150–440)
RBC: 5.08 10*6/uL (ref 3.80–5.20)
RDW: 13.3 % (ref 11.5–14.5)
WBC: 5 x10 3/mm (ref 3.6–11.0)

## 2014-04-02 LAB — LACTATE DEHYDROGENASE: LDH: 194 U/L (ref 81–246)

## 2014-04-03 DIAGNOSIS — M5126 Other intervertebral disc displacement, lumbar region: Secondary | ICD-10-CM | POA: Diagnosis not present

## 2014-04-06 DIAGNOSIS — Z86010 Personal history of colonic polyps: Secondary | ICD-10-CM | POA: Insufficient documentation

## 2014-04-06 DIAGNOSIS — K219 Gastro-esophageal reflux disease without esophagitis: Secondary | ICD-10-CM | POA: Diagnosis not present

## 2014-04-09 ENCOUNTER — Other Ambulatory Visit: Payer: Self-pay | Admitting: Neurosurgery

## 2014-04-09 DIAGNOSIS — M545 Low back pain, unspecified: Secondary | ICD-10-CM

## 2014-04-10 ENCOUNTER — Ambulatory Visit: Payer: Self-pay | Admitting: Oncology

## 2014-04-18 ENCOUNTER — Ambulatory Visit
Admission: RE | Admit: 2014-04-18 | Discharge: 2014-04-18 | Disposition: A | Discharge status: Home / Self Care | Payer: Medicare Other | Source: Ambulatory Visit | Attending: Neurosurgery | Admitting: Neurosurgery

## 2014-04-18 DIAGNOSIS — M545 Low back pain, unspecified: Secondary | ICD-10-CM

## 2014-04-18 DIAGNOSIS — Z87898 Personal history of other specified conditions: Secondary | ICD-10-CM | POA: Diagnosis not present

## 2014-04-18 DIAGNOSIS — M47817 Spondylosis without myelopathy or radiculopathy, lumbosacral region: Secondary | ICD-10-CM | POA: Diagnosis not present

## 2014-04-18 MED ORDER — GADOBENATE DIMEGLUMINE 529 MG/ML IV SOLN
16.0000 mL | Freq: Once | INTRAVENOUS | Status: AC | PRN
  Administered 2014-04-18: 16 mL via INTRAVENOUS

## 2014-04-24 ENCOUNTER — Ambulatory Visit: Payer: Self-pay | Admitting: Gastroenterology

## 2014-04-24 DIAGNOSIS — R1013 Epigastric pain: Secondary | ICD-10-CM | POA: Diagnosis not present

## 2014-04-24 DIAGNOSIS — Z79899 Other long term (current) drug therapy: Secondary | ICD-10-CM | POA: Diagnosis not present

## 2014-04-24 DIAGNOSIS — K319 Disease of stomach and duodenum, unspecified: Secondary | ICD-10-CM | POA: Diagnosis not present

## 2014-04-24 DIAGNOSIS — K2940 Chronic atrophic gastritis without bleeding: Secondary | ICD-10-CM | POA: Diagnosis not present

## 2014-04-24 DIAGNOSIS — Z86010 Personal history of colonic polyps: Secondary | ICD-10-CM | POA: Diagnosis not present

## 2014-04-24 DIAGNOSIS — K5730 Diverticulosis of large intestine without perforation or abscess without bleeding: Secondary | ICD-10-CM | POA: Diagnosis not present

## 2014-04-24 DIAGNOSIS — Z09 Encounter for follow-up examination after completed treatment for conditions other than malignant neoplasm: Secondary | ICD-10-CM | POA: Diagnosis not present

## 2014-04-24 DIAGNOSIS — K2990 Gastroduodenitis, unspecified, without bleeding: Secondary | ICD-10-CM | POA: Diagnosis not present

## 2014-04-24 DIAGNOSIS — D126 Benign neoplasm of colon, unspecified: Secondary | ICD-10-CM | POA: Diagnosis not present

## 2014-04-24 DIAGNOSIS — K2100 Gastro-esophageal reflux disease with esophagitis, without bleeding: Secondary | ICD-10-CM | POA: Diagnosis not present

## 2014-04-24 DIAGNOSIS — K3189 Other diseases of stomach and duodenum: Secondary | ICD-10-CM | POA: Diagnosis not present

## 2014-04-24 DIAGNOSIS — K2970 Gastritis, unspecified, without bleeding: Secondary | ICD-10-CM | POA: Diagnosis not present

## 2014-04-25 DIAGNOSIS — D126 Benign neoplasm of colon, unspecified: Secondary | ICD-10-CM | POA: Diagnosis not present

## 2014-04-25 DIAGNOSIS — K2100 Gastro-esophageal reflux disease with esophagitis, without bleeding: Secondary | ICD-10-CM | POA: Diagnosis not present

## 2014-04-25 DIAGNOSIS — K2970 Gastritis, unspecified, without bleeding: Secondary | ICD-10-CM | POA: Diagnosis not present

## 2014-04-25 DIAGNOSIS — K319 Disease of stomach and duodenum, unspecified: Secondary | ICD-10-CM | POA: Diagnosis not present

## 2014-04-25 DIAGNOSIS — K2990 Gastroduodenitis, unspecified, without bleeding: Secondary | ICD-10-CM | POA: Diagnosis not present

## 2014-04-26 LAB — PATHOLOGY REPORT

## 2014-05-01 ENCOUNTER — Ambulatory Visit: Payer: Self-pay | Admitting: Oncology

## 2014-05-01 DIAGNOSIS — M549 Dorsalgia, unspecified: Secondary | ICD-10-CM | POA: Diagnosis not present

## 2014-05-01 DIAGNOSIS — R259 Unspecified abnormal involuntary movements: Secondary | ICD-10-CM | POA: Diagnosis not present

## 2014-05-01 DIAGNOSIS — Z1231 Encounter for screening mammogram for malignant neoplasm of breast: Secondary | ICD-10-CM | POA: Diagnosis not present

## 2014-05-01 DIAGNOSIS — R928 Other abnormal and inconclusive findings on diagnostic imaging of breast: Secondary | ICD-10-CM | POA: Diagnosis not present

## 2014-05-01 DIAGNOSIS — G4752 REM sleep behavior disorder: Secondary | ICD-10-CM | POA: Diagnosis not present

## 2014-05-01 DIAGNOSIS — C8291 Follicular lymphoma, unspecified, lymph nodes of head, face, and neck: Secondary | ICD-10-CM | POA: Diagnosis not present

## 2014-05-01 DIAGNOSIS — G20 Parkinson's disease: Secondary | ICD-10-CM | POA: Diagnosis not present

## 2014-05-02 DIAGNOSIS — E041 Nontoxic single thyroid nodule: Secondary | ICD-10-CM | POA: Diagnosis not present

## 2014-05-02 DIAGNOSIS — C8291 Follicular lymphoma, unspecified, lymph nodes of head, face, and neck: Secondary | ICD-10-CM | POA: Diagnosis not present

## 2014-05-03 DIAGNOSIS — M5126 Other intervertebral disc displacement, lumbar region: Secondary | ICD-10-CM | POA: Diagnosis not present

## 2014-05-03 DIAGNOSIS — Z6827 Body mass index (BMI) 27.0-27.9, adult: Secondary | ICD-10-CM | POA: Diagnosis not present

## 2014-05-10 ENCOUNTER — Ambulatory Visit: Payer: Self-pay | Admitting: Oncology

## 2014-05-10 DIAGNOSIS — F419 Anxiety disorder, unspecified: Secondary | ICD-10-CM | POA: Diagnosis not present

## 2014-05-10 DIAGNOSIS — J00 Acute nasopharyngitis [common cold]: Secondary | ICD-10-CM | POA: Diagnosis not present

## 2014-05-10 DIAGNOSIS — Z87891 Personal history of nicotine dependence: Secondary | ICD-10-CM | POA: Diagnosis not present

## 2014-05-10 DIAGNOSIS — Z23 Encounter for immunization: Secondary | ICD-10-CM | POA: Diagnosis not present

## 2014-05-10 DIAGNOSIS — M47896 Other spondylosis, lumbar region: Secondary | ICD-10-CM | POA: Diagnosis not present

## 2014-05-10 DIAGNOSIS — E780 Pure hypercholesterolemia: Secondary | ICD-10-CM | POA: Diagnosis not present

## 2014-05-10 DIAGNOSIS — Z79899 Other long term (current) drug therapy: Secondary | ICD-10-CM | POA: Diagnosis not present

## 2014-05-10 DIAGNOSIS — G20 Parkinson's disease: Secondary | ICD-10-CM | POA: Diagnosis not present

## 2014-05-10 DIAGNOSIS — Z5111 Encounter for antineoplastic chemotherapy: Secondary | ICD-10-CM | POA: Diagnosis not present

## 2014-05-10 DIAGNOSIS — H9190 Unspecified hearing loss, unspecified ear: Secondary | ICD-10-CM | POA: Diagnosis not present

## 2014-05-10 DIAGNOSIS — M545 Low back pain: Secondary | ICD-10-CM | POA: Diagnosis not present

## 2014-05-10 DIAGNOSIS — R5383 Other fatigue: Secondary | ICD-10-CM | POA: Diagnosis not present

## 2014-05-10 DIAGNOSIS — C8211 Follicular lymphoma grade II, lymph nodes of head, face, and neck: Secondary | ICD-10-CM | POA: Diagnosis not present

## 2014-05-22 DIAGNOSIS — M545 Low back pain: Secondary | ICD-10-CM | POA: Diagnosis not present

## 2014-05-22 DIAGNOSIS — G8929 Other chronic pain: Secondary | ICD-10-CM | POA: Diagnosis not present

## 2014-05-22 DIAGNOSIS — M47816 Spondylosis without myelopathy or radiculopathy, lumbar region: Secondary | ICD-10-CM | POA: Diagnosis not present

## 2014-05-23 DIAGNOSIS — M47817 Spondylosis without myelopathy or radiculopathy, lumbosacral region: Secondary | ICD-10-CM | POA: Diagnosis not present

## 2014-05-23 DIAGNOSIS — M47816 Spondylosis without myelopathy or radiculopathy, lumbar region: Secondary | ICD-10-CM | POA: Diagnosis not present

## 2014-05-23 DIAGNOSIS — M545 Low back pain: Secondary | ICD-10-CM | POA: Diagnosis not present

## 2014-05-28 DIAGNOSIS — K210 Gastro-esophageal reflux disease with esophagitis: Secondary | ICD-10-CM | POA: Diagnosis not present

## 2014-05-28 DIAGNOSIS — H2012 Chronic iridocyclitis, left eye: Secondary | ICD-10-CM | POA: Diagnosis not present

## 2014-05-28 DIAGNOSIS — D126 Benign neoplasm of colon, unspecified: Secondary | ICD-10-CM | POA: Diagnosis not present

## 2014-05-29 DIAGNOSIS — J209 Acute bronchitis, unspecified: Secondary | ICD-10-CM | POA: Diagnosis not present

## 2014-05-29 DIAGNOSIS — J069 Acute upper respiratory infection, unspecified: Secondary | ICD-10-CM | POA: Diagnosis not present

## 2014-06-04 DIAGNOSIS — G20 Parkinson's disease: Secondary | ICD-10-CM | POA: Diagnosis not present

## 2014-06-04 DIAGNOSIS — Z5111 Encounter for antineoplastic chemotherapy: Secondary | ICD-10-CM | POA: Diagnosis not present

## 2014-06-04 DIAGNOSIS — C8211 Follicular lymphoma grade II, lymph nodes of head, face, and neck: Secondary | ICD-10-CM | POA: Diagnosis not present

## 2014-06-04 DIAGNOSIS — M545 Low back pain: Secondary | ICD-10-CM | POA: Diagnosis not present

## 2014-06-04 LAB — COMPREHENSIVE METABOLIC PANEL
Albumin: 3.6 g/dL (ref 3.4–5.0)
Alkaline Phosphatase: 109 U/L
Anion Gap: 8 (ref 7–16)
BUN: 16 mg/dL (ref 7–18)
Bilirubin,Total: 0.5 mg/dL (ref 0.2–1.0)
Calcium, Total: 8.8 mg/dL (ref 8.5–10.1)
Chloride: 107 mmol/L (ref 98–107)
Co2: 29 mmol/L (ref 21–32)
Creatinine: 0.71 mg/dL (ref 0.60–1.30)
EGFR (African American): >60
EGFR (Non-African Amer.): >60
Glucose: 100 mg/dL — ABNORMAL HIGH (ref 65–99)
Osmolality: 288 (ref 275–301)
Potassium: 3.6 mmol/L (ref 3.5–5.1)
SGOT(AST): 17 U/L (ref 15–37)
SGPT (ALT): 9 U/L — ABNORMAL LOW
Sodium: 144 mmol/L (ref 136–145)
Total Protein: 6.5 g/dL (ref 6.4–8.2)

## 2014-06-04 LAB — CBC CANCER CENTER
Basophil #: 0 x10 3/mm (ref 0.0–0.1)
Basophil %: 0.7 %
Eosinophil #: 0.1 x10 3/mm (ref 0.0–0.7)
Eosinophil %: 2.7 %
HCT: 45.8 % (ref 35.0–47.0)
HGB: 14.7 g/dL (ref 12.0–16.0)
Lymphocyte #: 0.8 x10 3/mm — ABNORMAL LOW (ref 1.0–3.6)
Lymphocyte %: 15.4 %
MCH: 28.8 pg (ref 26.0–34.0)
MCHC: 32.1 g/dL (ref 32.0–36.0)
MCV: 90 fL (ref 80–100)
Monocyte #: 0.5 x10 3/mm (ref 0.2–0.9)
Monocyte %: 9.1 %
Neutrophil #: 3.8 x10 3/mm (ref 1.4–6.5)
Neutrophil %: 72.1 %
Platelet: 189 x10 3/mm (ref 150–440)
RBC: 5.1 10*6/uL (ref 3.80–5.20)
RDW: 12.7 % (ref 11.5–14.5)
WBC: 5.3 x10 3/mm (ref 3.6–11.0)

## 2014-06-04 LAB — LACTATE DEHYDROGENASE: LDH: 164 U/L (ref 81–246)

## 2014-06-05 DIAGNOSIS — H2012 Chronic iridocyclitis, left eye: Secondary | ICD-10-CM | POA: Diagnosis not present

## 2014-06-10 ENCOUNTER — Ambulatory Visit: Payer: Self-pay | Admitting: Oncology

## 2014-06-19 DIAGNOSIS — M47816 Spondylosis without myelopathy or radiculopathy, lumbar region: Secondary | ICD-10-CM | POA: Diagnosis not present

## 2014-06-19 DIAGNOSIS — M47817 Spondylosis without myelopathy or radiculopathy, lumbosacral region: Secondary | ICD-10-CM | POA: Diagnosis not present

## 2014-06-28 DIAGNOSIS — H2012 Chronic iridocyclitis, left eye: Secondary | ICD-10-CM | POA: Diagnosis not present

## 2014-06-28 DIAGNOSIS — M9901 Segmental and somatic dysfunction of cervical region: Secondary | ICD-10-CM | POA: Diagnosis not present

## 2014-06-28 DIAGNOSIS — M9902 Segmental and somatic dysfunction of thoracic region: Secondary | ICD-10-CM | POA: Diagnosis not present

## 2014-06-28 DIAGNOSIS — M62830 Muscle spasm of back: Secondary | ICD-10-CM | POA: Diagnosis not present

## 2014-06-28 DIAGNOSIS — M5412 Radiculopathy, cervical region: Secondary | ICD-10-CM | POA: Diagnosis not present

## 2014-07-02 DIAGNOSIS — M62830 Muscle spasm of back: Secondary | ICD-10-CM | POA: Diagnosis not present

## 2014-07-02 DIAGNOSIS — M5412 Radiculopathy, cervical region: Secondary | ICD-10-CM | POA: Diagnosis not present

## 2014-07-02 DIAGNOSIS — M9901 Segmental and somatic dysfunction of cervical region: Secondary | ICD-10-CM | POA: Diagnosis not present

## 2014-07-02 DIAGNOSIS — H209 Unspecified iridocyclitis: Secondary | ICD-10-CM | POA: Diagnosis not present

## 2014-07-02 DIAGNOSIS — M9902 Segmental and somatic dysfunction of thoracic region: Secondary | ICD-10-CM | POA: Diagnosis not present

## 2014-07-02 DIAGNOSIS — H43811 Vitreous degeneration, right eye: Secondary | ICD-10-CM | POA: Diagnosis not present

## 2014-07-16 DIAGNOSIS — H35352 Cystoid macular degeneration, left eye: Secondary | ICD-10-CM | POA: Diagnosis not present

## 2014-07-25 ENCOUNTER — Ambulatory Visit: Payer: Self-pay | Admitting: Unknown Physician Specialty

## 2014-07-25 DIAGNOSIS — E041 Nontoxic single thyroid nodule: Secondary | ICD-10-CM | POA: Diagnosis not present

## 2014-07-25 DIAGNOSIS — E042 Nontoxic multinodular goiter: Secondary | ICD-10-CM | POA: Diagnosis not present

## 2014-07-27 DIAGNOSIS — Z1389 Encounter for screening for other disorder: Secondary | ICD-10-CM | POA: Diagnosis not present

## 2014-07-27 DIAGNOSIS — M7122 Synovial cyst of popliteal space [Baker], left knee: Secondary | ICD-10-CM | POA: Diagnosis not present

## 2014-07-30 ENCOUNTER — Ambulatory Visit: Payer: Self-pay | Admitting: Oncology

## 2014-07-30 DIAGNOSIS — R531 Weakness: Secondary | ICD-10-CM | POA: Diagnosis not present

## 2014-07-30 DIAGNOSIS — G20 Parkinson's disease: Secondary | ICD-10-CM | POA: Diagnosis not present

## 2014-07-30 DIAGNOSIS — G8929 Other chronic pain: Secondary | ICD-10-CM | POA: Diagnosis not present

## 2014-07-30 DIAGNOSIS — C8211 Follicular lymphoma grade II, lymph nodes of head, face, and neck: Secondary | ICD-10-CM | POA: Diagnosis not present

## 2014-07-30 DIAGNOSIS — M549 Dorsalgia, unspecified: Secondary | ICD-10-CM | POA: Diagnosis not present

## 2014-07-30 DIAGNOSIS — Z79899 Other long term (current) drug therapy: Secondary | ICD-10-CM | POA: Diagnosis not present

## 2014-07-30 DIAGNOSIS — R5383 Other fatigue: Secondary | ICD-10-CM | POA: Diagnosis not present

## 2014-07-30 DIAGNOSIS — Z5111 Encounter for antineoplastic chemotherapy: Secondary | ICD-10-CM | POA: Diagnosis not present

## 2014-07-30 DIAGNOSIS — F419 Anxiety disorder, unspecified: Secondary | ICD-10-CM | POA: Diagnosis not present

## 2014-07-30 DIAGNOSIS — E780 Pure hypercholesterolemia: Secondary | ICD-10-CM | POA: Diagnosis not present

## 2014-07-30 DIAGNOSIS — Z87891 Personal history of nicotine dependence: Secondary | ICD-10-CM | POA: Diagnosis not present

## 2014-07-30 LAB — COMPREHENSIVE METABOLIC PANEL
Albumin: 3.8 g/dL (ref 3.4–5.0)
Alkaline Phosphatase: 106 U/L
Anion Gap: 7 (ref 7–16)
BUN: 16 mg/dL (ref 7–18)
Bilirubin,Total: 0.5 mg/dL (ref 0.2–1.0)
Calcium, Total: 8.8 mg/dL (ref 8.5–10.1)
Chloride: 106 mmol/L (ref 98–107)
Co2: 31 mmol/L (ref 21–32)
Creatinine: 0.77 mg/dL (ref 0.60–1.30)
EGFR (African American): >60
EGFR (Non-African Amer.): >60
Glucose: 103 mg/dL — ABNORMAL HIGH (ref 65–99)
Osmolality: 288 (ref 275–301)
Potassium: 3.6 mmol/L (ref 3.5–5.1)
SGOT(AST): 17 U/L (ref 15–37)
SGPT (ALT): 11 U/L — ABNORMAL LOW
Sodium: 144 mmol/L (ref 136–145)
Total Protein: 6.9 g/dL (ref 6.4–8.2)

## 2014-07-30 LAB — CBC CANCER CENTER
Basophil #: 0 x10 3/mm (ref 0.0–0.1)
Basophil %: 0.7 %
Eosinophil #: 0.2 x10 3/mm (ref 0.0–0.7)
Eosinophil %: 2.8 %
HCT: 46.9 % (ref 35.0–47.0)
HGB: 15.2 g/dL (ref 12.0–16.0)
Lymphocyte #: 1 x10 3/mm (ref 1.0–3.6)
Lymphocyte %: 18.4 %
MCH: 28.6 pg (ref 26.0–34.0)
MCHC: 32.4 g/dL (ref 32.0–36.0)
MCV: 88 fL (ref 80–100)
Monocyte #: 0.6 x10 3/mm (ref 0.2–0.9)
Monocyte %: 11.2 %
Neutrophil #: 3.6 x10 3/mm (ref 1.4–6.5)
Neutrophil %: 66.9 %
Platelet: 179 x10 3/mm (ref 150–440)
RBC: 5.3 10*6/uL — ABNORMAL HIGH (ref 3.80–5.20)
RDW: 13.1 % (ref 11.5–14.5)
WBC: 5.4 x10 3/mm (ref 3.6–11.0)

## 2014-07-30 LAB — LACTATE DEHYDROGENASE: LDH: 160 U/L (ref 81–246)

## 2014-07-31 DIAGNOSIS — M9905 Segmental and somatic dysfunction of pelvic region: Secondary | ICD-10-CM | POA: Diagnosis not present

## 2014-07-31 DIAGNOSIS — M5432 Sciatica, left side: Secondary | ICD-10-CM | POA: Diagnosis not present

## 2014-07-31 DIAGNOSIS — M9903 Segmental and somatic dysfunction of lumbar region: Secondary | ICD-10-CM | POA: Diagnosis not present

## 2014-07-31 DIAGNOSIS — M955 Acquired deformity of pelvis: Secondary | ICD-10-CM | POA: Diagnosis not present

## 2014-08-10 ENCOUNTER — Ambulatory Visit: Payer: Self-pay | Admitting: Oncology

## 2014-08-13 DIAGNOSIS — D367 Benign neoplasm of other specified sites: Secondary | ICD-10-CM | POA: Diagnosis not present

## 2014-08-16 ENCOUNTER — Encounter: Payer: Self-pay | Admitting: Podiatry

## 2014-08-16 ENCOUNTER — Ambulatory Visit (INDEPENDENT_AMBULATORY_CARE_PROVIDER_SITE_OTHER): Payer: Medicare Other

## 2014-08-16 ENCOUNTER — Other Ambulatory Visit: Payer: Self-pay | Admitting: *Deleted

## 2014-08-16 ENCOUNTER — Ambulatory Visit (INDEPENDENT_AMBULATORY_CARE_PROVIDER_SITE_OTHER): Payer: Medicare Other | Admitting: Podiatry

## 2014-08-16 VITALS — BP 108/68 | HR 87 | Resp 16 | Ht 66.0 in | Wt 170.0 lb

## 2014-08-16 DIAGNOSIS — M2012 Hallux valgus (acquired), left foot: Secondary | ICD-10-CM | POA: Diagnosis not present

## 2014-08-16 DIAGNOSIS — M2011 Hallux valgus (acquired), right foot: Secondary | ICD-10-CM | POA: Diagnosis not present

## 2014-08-16 DIAGNOSIS — B351 Tinea unguium: Secondary | ICD-10-CM

## 2014-08-16 DIAGNOSIS — M79673 Pain in unspecified foot: Secondary | ICD-10-CM

## 2014-08-16 DIAGNOSIS — M21611 Bunion of right foot: Secondary | ICD-10-CM

## 2014-08-16 DIAGNOSIS — M205X1 Other deformities of toe(s) (acquired), right foot: Secondary | ICD-10-CM | POA: Diagnosis not present

## 2014-08-16 MED ORDER — TAVABOROLE 5 % EX SOLN: 1.0000 [drp] | CUTANEOUS | Status: DC

## 2014-08-16 NOTE — Progress Notes (Addendum)
Subjective:    Patient ID: Sarah Donaldson, female    DOB: 1950/12/04, 64 y.o.   MRN: [id number]  HPI Comments: 64 year old female presents the office today with complaints of bilateral foot pain with a right greater than left. She states that she has pain in both of her first big toe joints and she believes that she is starting to develop bunions. She states that she has noticed the symptoms over the last year. She states that certain shoe gear increases the symptoms as well as pressure to the area. She is attended shoe gear changes without much resolution of symptoms. She is also inquiring about discoloration to the left big toenail. She states that over a couple months she has starts noticed some discoloration and thickness of the toenail. She said no prior treatments for this. She denies any history of injury or trauma to the bilateral lower extremity. No other complaints at this time.   She does have a history of non-Hodgkin's lymphoma and she has finished her treatments approximate 2 weeks ago. She also has Parkinson's. She will be undergoing excision of a mass on her right hand in the next couple weeks.  Foot Pain      Review of Systems  HENT: Positive for sinus pressure.        Ringing in ears  Eyes: Positive for redness and visual disturbance.  Gastrointestinal: Positive for constipation.       Bloating   Endocrine:       Excessive thirst   Genitourinary: Positive for urgency.  Musculoskeletal: Positive for back pain.       Joint pain Difficulty walking   Neurological: Positive for tremors and light-headedness.  All other systems reviewed and are negative.      Objective:   Physical Exam AAO 3, NAD DP/PT pulses palpable, CRT less than 3 seconds Protective sensation appears to be intact with Derrel Nip monofilament, vibratory sensation intact, Achilles tendon reflex intact. On the right foot there is tenderness palpation overlying the first MTPJ with large  dorsal exostosis present on the dorsal aspect of the first MTPJ/first metatarsal. There is pain with range of motion of the first MTPJ however there is no specific crepitus identified. There is slight overlying erythema from pressure from shoe gear rubbing on the prominent exostosis. On the left foot there is a mild bunion deformity present. There is no pain or crepitation with first MTPJ range of motion. There is slight erythema overlying the first metatarsal head medially from pressure likely from shoe gear. There is no overlying edema, increase in warmth to bilateral lower extremities. No other areas of pinpoint bony tenderness or pain with vibratory sensation. MMT 5/5, ROM WNL The left hallux nails hypertrophic, dystrophic, brittle, yellow brownish discoloration without any surrounding erythema or drainage. No areas of open lesions or other pre-ulcer lesions are identified. No pain with calf compression, swelling, warmth, erythema.       Assessment & Plan:  64 year old female with left foot bunion, right foot symptomatic hallux limitus, likely onychomycosis left hallux -X-rays were obtained and reviewed the patient. -Conservative versus surgical options were discussed including alternatives, risks, complications. -For the left foot discussed bunion deformity and various conservative treatments including wider shoes as well as various padding and offloading devices to help protect the bunion. At the left foot is not symptomatic at this time we'll continue to monitor. Should the area become more symptomatic will discuss possible surgical intervention if she desires.  -For the right foot  there is symptomatic prominent dorsal exostosis which appear to be rubbing in shoe gear. There is pain with first MTPJ range of motion. X-rays reveal significant spurring and a large osteophyte within the first MTPJ. Discussed. Treatment options including orthotics, steroid injection, she gear modifications, padding.  Patient states that she would not wear an orthotic as she wishes to hold off a steroid injection. She has tried shoe gear changes. At this time she is requesting surgical intervention to help decrease her pain and deformity. I discussed the various options which included cheilectomy vs. Implant vs. Fusion. At this time should avoid to proceed with the aggressive cheilectomy option. However, as she has recently just finished treatments for non-Hodgkin's lymphoma we'll discuss surgery with her oncologist. She will also be undergoing surgery for her hand soon and I believe that she should wait until the recovery from the hand surgery is complete before undergoing foot surgery. Once I have discussed this with her physicians, will contact the patient. -For left hallux likely onychomycosis discussed there is treatment options. At this time the patient slipped to proceed with topical treatment. I prescribed [place] Mon and consented to [facility] and the patient was given contact information to follow-up with the pharmacy. Side effects the medication were discussed the patient and directed to stop immediately should any occur. -Follow-up as needed or sooner should any problems arise. In the meantime encouraged to call the office with any questions, concerns, change in symptoms. Follow-up with PCP for other issues mentioned in the ROS.   Neurologist- Dr. Melrose Nakayama [phone] Oncologist- Dr. Oliva Bustard [phone]  *On 08/23/13 letters were faxed to her neurologist and oncologist requesting clearance for surgery.

## 2014-08-17 DIAGNOSIS — H35352 Cystoid macular degeneration, left eye: Secondary | ICD-10-CM | POA: Diagnosis not present

## 2014-08-21 ENCOUNTER — Other Ambulatory Visit: Payer: Self-pay | Admitting: Unknown Physician Specialty

## 2014-08-21 ENCOUNTER — Ambulatory Visit: Payer: Self-pay | Admitting: Specialist

## 2014-08-21 DIAGNOSIS — Z029 Encounter for administrative examinations, unspecified: Secondary | ICD-10-CM | POA: Diagnosis not present

## 2014-08-21 DIAGNOSIS — J449 Chronic obstructive pulmonary disease, unspecified: Secondary | ICD-10-CM | POA: Diagnosis not present

## 2014-08-24 ENCOUNTER — Encounter: Payer: Self-pay | Admitting: Podiatry

## 2014-08-27 DIAGNOSIS — M955 Acquired deformity of pelvis: Secondary | ICD-10-CM | POA: Diagnosis not present

## 2014-08-27 DIAGNOSIS — M9905 Segmental and somatic dysfunction of pelvic region: Secondary | ICD-10-CM | POA: Diagnosis not present

## 2014-08-27 DIAGNOSIS — M5432 Sciatica, left side: Secondary | ICD-10-CM | POA: Diagnosis not present

## 2014-08-27 DIAGNOSIS — M9903 Segmental and somatic dysfunction of lumbar region: Secondary | ICD-10-CM | POA: Diagnosis not present

## 2014-08-31 ENCOUNTER — Ambulatory Visit: Payer: Self-pay | Admitting: Specialist

## 2014-08-31 DIAGNOSIS — G20 Parkinson's disease: Secondary | ICD-10-CM | POA: Diagnosis not present

## 2014-08-31 DIAGNOSIS — Z79899 Other long term (current) drug therapy: Secondary | ICD-10-CM | POA: Diagnosis not present

## 2014-08-31 DIAGNOSIS — R2231 Localized swelling, mass and lump, right upper limb: Secondary | ICD-10-CM | POA: Diagnosis not present

## 2014-08-31 DIAGNOSIS — D1721 Benign lipomatous neoplasm of skin and subcutaneous tissue of right arm: Secondary | ICD-10-CM | POA: Diagnosis not present

## 2014-08-31 DIAGNOSIS — Z885 Allergy status to narcotic agent status: Secondary | ICD-10-CM | POA: Diagnosis not present

## 2014-08-31 DIAGNOSIS — R609 Edema, unspecified: Secondary | ICD-10-CM | POA: Diagnosis not present

## 2014-08-31 DIAGNOSIS — Z87891 Personal history of nicotine dependence: Secondary | ICD-10-CM | POA: Diagnosis not present

## 2014-08-31 DIAGNOSIS — M1990 Unspecified osteoarthritis, unspecified site: Secondary | ICD-10-CM | POA: Diagnosis not present

## 2014-08-31 DIAGNOSIS — K219 Gastro-esophageal reflux disease without esophagitis: Secondary | ICD-10-CM | POA: Diagnosis not present

## 2014-08-31 DIAGNOSIS — D2111 Benign neoplasm of connective and other soft tissue of right upper limb, including shoulder: Secondary | ICD-10-CM | POA: Diagnosis not present

## 2014-09-03 DIAGNOSIS — D367 Benign neoplasm of other specified sites: Secondary | ICD-10-CM | POA: Diagnosis not present

## 2014-09-06 DIAGNOSIS — G4752 REM sleep behavior disorder: Secondary | ICD-10-CM | POA: Diagnosis not present

## 2014-09-06 DIAGNOSIS — M549 Dorsalgia, unspecified: Secondary | ICD-10-CM | POA: Diagnosis not present

## 2014-09-06 DIAGNOSIS — G20 Parkinson's disease: Secondary | ICD-10-CM | POA: Diagnosis not present

## 2014-09-06 DIAGNOSIS — G479 Sleep disorder, unspecified: Secondary | ICD-10-CM | POA: Diagnosis not present

## 2014-09-11 DIAGNOSIS — M9903 Segmental and somatic dysfunction of lumbar region: Secondary | ICD-10-CM | POA: Diagnosis not present

## 2014-09-11 DIAGNOSIS — M955 Acquired deformity of pelvis: Secondary | ICD-10-CM | POA: Diagnosis not present

## 2014-09-11 DIAGNOSIS — M9905 Segmental and somatic dysfunction of pelvic region: Secondary | ICD-10-CM | POA: Diagnosis not present

## 2014-09-11 DIAGNOSIS — M5432 Sciatica, left side: Secondary | ICD-10-CM | POA: Diagnosis not present

## 2014-09-18 ENCOUNTER — Ambulatory Visit (INDEPENDENT_AMBULATORY_CARE_PROVIDER_SITE_OTHER): Payer: Medicare Other | Admitting: Podiatry

## 2014-09-18 VITALS — BP 112/69 | HR 81

## 2014-09-18 DIAGNOSIS — M79673 Pain in unspecified foot: Secondary | ICD-10-CM | POA: Diagnosis not present

## 2014-09-18 DIAGNOSIS — M205X1 Other deformities of toe(s) (acquired), right foot: Secondary | ICD-10-CM | POA: Diagnosis not present

## 2014-09-18 NOTE — Patient Instructions (Signed)
Pre-Operative Instructions  Congratulations, you have decided to take an important step to improving your quality of life.  You can be assured that the doctors of [facility] will be with you every step of the way.  1. Plan to be at the surgery center/hospital at least 1 (one) hour prior to your scheduled time unless otherwise directed by the surgical center/hospital staff.  You must have a responsible adult accompany you, remain during the surgery and drive you home.  Make sure you have directions to the surgical center/hospital and know how to get there on time. 2. For hospital based surgery you will need to obtain a history and physical form from your family physician within 1 month prior to the date of surgery- we will give you a form for you primary physician.  3. We make every effort to accommodate the date you request for surgery.  There are however, times where surgery dates or times have to be moved.  We will contact you as soon as possible if a change in schedule is required.   4. No Aspirin/Ibuprofen for one week before surgery.  If you are on aspirin, any non-steroidal anti-inflammatory medications (Mobic, Aleve, Ibuprofen) you should stop taking it 7 days prior to your surgery.  You make take Tylenol  For pain prior to surgery.  5. Medications- If you are taking daily heart and blood pressure medications, seizure, reflux, allergy, asthma, anxiety, pain or diabetes medications, make sure the surgery center/hospital is aware before the day of surgery so they may notify you which medications to take or avoid the day of surgery. 6. No food or drink after midnight the night before surgery unless directed otherwise by surgical center/hospital staff. 7. No alcoholic beverages 24 hours prior to surgery.  No smoking 24 hours prior to or 24 hours after surgery. 8. Wear loose pants or shorts- loose enough to fit over bandages, boots, and casts. 9. No slip on shoes, sneakers are best. 10. Bring  your boot with you to the surgery center/hospital.  Also bring crutches or a walker if your physician has prescribed it for you.  If you do not have this equipment, it will be provided for you after surgery. 11. If you have not been contracted by the surgery center/hospital by the day before your surgery, call to confirm the date and time of your surgery. 12. Leave-time from work may vary depending on the type of surgery you have.  Appropriate arrangements should be made prior to surgery with your employer. 13. Prescriptions will be provided immediately following surgery by your doctor.  Have these filled as soon as possible after surgery and take the medication as directed. 14. Remove nail polish on the operative foot. 15. Wash the night before surgery.  The night before surgery wash the foot and leg well with the antibacterial soap provided and water paying special attention to beneath the toenails and in between the toes.  Rinse thoroughly with water and dry well with a towel.  Perform this wash unless told not to do so by your physician.  Enclosed: 1 Ice pack (please put in freezer the night before surgery)   1 Hibiclens skin cleaner   Pre-op Instructions  If you have any questions regarding the instructions, do not hesitate to call our office.  [place]: [address] [place], [place] [address] [phone]  [place]: [address] [place], [place] [address] [phone]  [facility]: [address]  [place], [place] [address] [phone]  Dr. Richard   Tuchman DPM, Dr. Norman Regal DPM Dr. Richard Sikora DPM, Dr. M. Todd Hyatt DPM, Dr. Kathryn Egerton DPM 

## 2014-09-19 NOTE — Progress Notes (Signed)
Patient ID: Sarah Donaldson, female   DOB: [birth year], 64 y.o.   MRN: [id number]  Subjective:  64 year old female presents the office today for surgical consultation of right foot painful bunion. She states that she continues to have pain overlying the area despite shoe gear modifications and padding. At this time she is requesting surgical intervention to help decrease her pain. She states that she has pain upon ambulation and she is unable to bend her toe without any discomfort. She is unable to wear many shoes due to the discomfort. No other complaints at this time. She has recently underwent right hand surgery.  Objective: AAO 3, NAD DP/PT pulses palpable, CRT less than 3 seconds Protective sensation intact with Simms Weinstein monofilament, vibratory sensation intact, Achilles tendon reflex intact. On the right foot there is decrease in range of motion of the first MTPJ with a large dorsal exostosis along the joint. There is pain with range of motion have there is no crepitus. There is mild overlying erythema from irritation in shoe gear. There is no open lesions or pre-ulcerative lesions identified. There is also decrease in range of motion of the left first MTPJ however not as symptomatic as the right. MMT 5/5, ROM WNL No open lesions or pre-ulcerative lesions. No pain with calf compression, swelling, warmth, erythema.  Assessment: 64 year old female with symptomatic hallux rigidus, right foot   Plan : -Both conservative and surgical options were discussed with the patient include alternatives, risks, complications. At this time she is requesting surgical intervention to help decrease her pain and deformity as she is attended conservative treatment without any resolution. Surgical intervention discussed the patient included a cheilectomy with excision of osteophyte in the joint versus arthroplasty of the joint with implant. She would like to proceed with a cheilectomy and excision of the  osteophyte however intraoperatively the joint is severely degenerative to proceed with the arthroplasty and implant. Risks of the surgery include, but not limited to, infection, bleeding, pain, swelling, need for further surgery, painful or ugly scar, delayed or nonhealing, over/under correction, hardware failure, transfer lesions, reoccurrence, DVT/PE. Patient understands these risks and wishes to proceed with surgery. Postoperative course was discussed the patient in great detail. The surgical consent was reviewed with the patient all 3 cages were signed. No promises or guarantees were given as the outcome of the procedure and all questions were answered to the best of my ability. Surgery will be performed of the [facility] specialty surgical center. She'll follow-up in the office after surgery however she is encouraged to call the office prior with any questions, concerns, change in symptoms.

## 2014-09-27 DIAGNOSIS — M9905 Segmental and somatic dysfunction of pelvic region: Secondary | ICD-10-CM | POA: Diagnosis not present

## 2014-09-27 DIAGNOSIS — M9903 Segmental and somatic dysfunction of lumbar region: Secondary | ICD-10-CM | POA: Diagnosis not present

## 2014-09-27 DIAGNOSIS — M5432 Sciatica, left side: Secondary | ICD-10-CM | POA: Diagnosis not present

## 2014-09-27 DIAGNOSIS — M955 Acquired deformity of pelvis: Secondary | ICD-10-CM | POA: Diagnosis not present

## 2014-09-28 DIAGNOSIS — M5432 Sciatica, left side: Secondary | ICD-10-CM | POA: Diagnosis not present

## 2014-09-28 DIAGNOSIS — M9905 Segmental and somatic dysfunction of pelvic region: Secondary | ICD-10-CM | POA: Diagnosis not present

## 2014-09-28 DIAGNOSIS — M955 Acquired deformity of pelvis: Secondary | ICD-10-CM | POA: Diagnosis not present

## 2014-09-28 DIAGNOSIS — M9903 Segmental and somatic dysfunction of lumbar region: Secondary | ICD-10-CM | POA: Diagnosis not present

## 2014-10-01 ENCOUNTER — Telehealth: Payer: Self-pay | Admitting: *Deleted

## 2014-10-01 ENCOUNTER — Other Ambulatory Visit: Payer: Self-pay | Admitting: Podiatry

## 2014-10-01 ENCOUNTER — Encounter: Payer: Self-pay | Admitting: Podiatry

## 2014-10-01 DIAGNOSIS — M89371 Hypertrophy of bone, right ankle and foot: Secondary | ICD-10-CM | POA: Diagnosis not present

## 2014-10-01 DIAGNOSIS — M2021 Hallux rigidus, right foot: Secondary | ICD-10-CM | POA: Diagnosis not present

## 2014-10-01 DIAGNOSIS — K219 Gastro-esophageal reflux disease without esophagitis: Secondary | ICD-10-CM | POA: Diagnosis not present

## 2014-10-01 DIAGNOSIS — M25571 Pain in right ankle and joints of right foot: Secondary | ICD-10-CM | POA: Diagnosis not present

## 2014-10-01 DIAGNOSIS — M205X1 Other deformities of toe(s) (acquired), right foot: Secondary | ICD-10-CM | POA: Diagnosis not present

## 2014-10-01 DIAGNOSIS — M2011 Hallux valgus (acquired), right foot: Secondary | ICD-10-CM | POA: Diagnosis not present

## 2014-10-01 MED ORDER — HYDROCODONE-ACETAMINOPHEN 5-325 MG PO TABS: 1.0000 | ORAL_TABLET | Freq: Four times a day (QID) | ORAL | Status: DC | PRN

## 2014-10-01 NOTE — Telephone Encounter (Signed)
PT HUSBAND PICK UP THE RX. HYDROCODONE 5/325 IN [place] TODAY.

## 2014-10-01 NOTE — Telephone Encounter (Signed)
"  We're calling because Dr. Jacqualyn Posey wrote a prescription for Vicodin 5/325.  Vicodin does not come in that strength.  Can we change it to Vicodin 5/300?  We cannot change it to Hydrocodone, will have to have a new prescription if he doesn't want to allow [place] to change it."  Per Dr. Milinda Pointer, I informed her he will change it to Hydrocodone 5/325.  "Okay, can't change it to that."  We'll send a different prescription.  I spoke to patient's husband.  He will go by the [place] office to pick up the prescription.  I asked him to wait until 1:30pm to go by to get the prescription.

## 2014-10-03 NOTE — Progress Notes (Signed)
DOS 10/01/2014 right keller/McBride bunionectomy, cheilectomy

## 2014-10-03 NOTE — Progress Notes (Signed)
Spoke with pt regarding post operative status, she stated that she was managing her pain effectively and resting. Advised  Ice and elevate, watch for s/s of infection and call with questions or concerns

## 2014-10-09 ENCOUNTER — Ambulatory Visit (INDEPENDENT_AMBULATORY_CARE_PROVIDER_SITE_OTHER): Payer: Medicare Other

## 2014-10-09 ENCOUNTER — Ambulatory Visit (INDEPENDENT_AMBULATORY_CARE_PROVIDER_SITE_OTHER): Payer: Medicare Other | Admitting: Podiatry

## 2014-10-09 VITALS — BP 120/68 | HR 88 | Temp 97.5°F | Resp 16

## 2014-10-09 DIAGNOSIS — Z9889 Other specified postprocedural states: Secondary | ICD-10-CM

## 2014-10-09 DIAGNOSIS — M21611 Bunion of right foot: Secondary | ICD-10-CM

## 2014-10-09 DIAGNOSIS — M2011 Hallux valgus (acquired), right foot: Secondary | ICD-10-CM

## 2014-10-09 NOTE — Progress Notes (Signed)
Patient ID: Sarah Donaldson, female   DOB: [birth year], 64 y.o.   MRN: [id number]  DOS: 10/01/14 R chielectomy  Subjective: 64 year old female presents the office today one-week status post right foot cheilectomy. She states that she has continued with a surgical shoe at all times. She states that she can feel the area as well with standing for prolonged period time. She states that she did have some discomfort medially after surgery however has improved with pain medication. She took antibiotics as directed. She denies any systemic complaints as fevers, chills, nausea, vomiting. Denies any calf pain, chest pain, shortness of breath. No other complaints at this time in no acute changes since last appointment.  Objective: AAO 3, NAD DP/PT pulses palpable, CRT less than 3 seconds Protective sensation intact with Simms Weinstein monofilament, vibratory sensation intact, Achilles tendon reflex intact. Incisional the dorsal medial aspect of the right foot as well coapted without any evidence of dehiscence. There is no swelling erythema, ascending cellulitis, drainage/purulence, malodor, or any other clinical signs of infection. There is mild pain with range of motion of the first MTPJ and there is edema overlying the joint. No other areas of tenderness to bilateral lower extremities. No other areas of edema, erythema, increase in warmth. No pain with calf compression, swelling, warmth, erythema. No other open lesions or pre-ulcerative lesions are identified.  Assessment: 64 year old female status post right foot cheilectomy  Plan: -X-rays were obtained and reviewed the patient. -Treatment options were discussed including alternatives, risks, complications. -Antibiotic ointment was applied over the incision followed by dry sterile dressing. Discussed that acute distress and clean, dry, intact. -Continue surgical shoe. -Ice and elevation. -Pain medication as needed -Follow-up in one week for suture  removal. At that time we'll likely transition into a regular shoe as tolerated. -Continue monitor for any signs or symptoms of DVT/PE or infection. Directed to go directly to the emergency room if any occur call the office.

## 2014-10-16 ENCOUNTER — Ambulatory Visit (INDEPENDENT_AMBULATORY_CARE_PROVIDER_SITE_OTHER): Payer: Medicare Other | Admitting: Podiatry

## 2014-10-16 VITALS — BP 108/70 | HR 88

## 2014-10-16 DIAGNOSIS — Z9889 Other specified postprocedural states: Secondary | ICD-10-CM

## 2014-10-16 NOTE — Progress Notes (Signed)
Patient ID: Sarah Donaldson, female   DOB: October 25, 1950, 64 y.o.   MRN: [id number]  DOS: 10/01/14 R chielectomy  Subjective: 64 year old female presents the office today two-weeks status post right foot cheilectomy.She does that she does have some mild discomfort however it has improved compared to last appointment. She does state his last appointment she has been more active however she does continue with a surgical shoe at all times. She's been taking ibuprofen for pain. She denies any systemic complaints such as fevers, chills, nausea, vomiting. Denies any calf pain, chest pain, shortness of breath. No other complaints at this time in no acute changes since last appointment.  Objective: AAO 3, NAD DP/PT pulses palpable, CRT less than 3 seconds Protective sensation intact with Simms Weinstein monofilament, vibratory sensation intact, Achilles tendon reflex intact. Incision onthe dorsal medial aspect of the right foot as well coapted without any evidence of dehiscence. There is no surrounding erythema, ascending cellulitis, drainage/purulence, malodor, or any other clinical signs of infection. There is mild pain with range of motion of the first MTPJ and there is edema overlying the joint although it does appear improved. No other areas of tenderness to bilateral lower extremities. No other areas of edema, erythema, increase in warmth. No pain with calf compression, swelling, warmth, erythema. No other open lesions or pre-ulcerative lesions are identified.  Assessment: 64 year old female status post right foot cheilectomy  Plan: -Treatment options were discussed including alternatives, risks, complications. -Antibiotic ointment was applied over the incision followed by dry sterile dressing. She is in the shower however I recommended that if the incision opens if there is any palms off on [place] call the office. After were skewed the area clean and dry and apply small metal interbody appointment over  the incision. -Continue surgical shoe as needed however she can start to transition into a regular shoe as tolerated. -Ice and elevation. -Dispensed a compression anklet. -Follow-up in three weeks for suture removal or sooner should any problems arise. In the meantime encouraged to call the office they question, concerns, change in symptoms. -Continue monitor for any signs or symptoms of DVT/PE or infection. Directed to go directly to the emergency room if any occur call the office.

## 2014-10-29 DIAGNOSIS — M9905 Segmental and somatic dysfunction of pelvic region: Secondary | ICD-10-CM | POA: Diagnosis not present

## 2014-10-29 DIAGNOSIS — M5432 Sciatica, left side: Secondary | ICD-10-CM | POA: Diagnosis not present

## 2014-10-29 DIAGNOSIS — M955 Acquired deformity of pelvis: Secondary | ICD-10-CM | POA: Diagnosis not present

## 2014-10-29 DIAGNOSIS — M9903 Segmental and somatic dysfunction of lumbar region: Secondary | ICD-10-CM | POA: Diagnosis not present

## 2014-11-05 DIAGNOSIS — M5432 Sciatica, left side: Secondary | ICD-10-CM | POA: Diagnosis not present

## 2014-11-05 DIAGNOSIS — M9905 Segmental and somatic dysfunction of pelvic region: Secondary | ICD-10-CM | POA: Diagnosis not present

## 2014-11-05 DIAGNOSIS — M955 Acquired deformity of pelvis: Secondary | ICD-10-CM | POA: Diagnosis not present

## 2014-11-05 DIAGNOSIS — M9903 Segmental and somatic dysfunction of lumbar region: Secondary | ICD-10-CM | POA: Diagnosis not present

## 2014-11-06 ENCOUNTER — Ambulatory Visit (INDEPENDENT_AMBULATORY_CARE_PROVIDER_SITE_OTHER): Payer: Medicare Other

## 2014-11-06 ENCOUNTER — Ambulatory Visit (INDEPENDENT_AMBULATORY_CARE_PROVIDER_SITE_OTHER): Payer: Medicare Other | Admitting: Podiatry

## 2014-11-06 VITALS — BP 110/70 | HR 66 | Resp 16

## 2014-11-06 DIAGNOSIS — Z9889 Other specified postprocedural states: Secondary | ICD-10-CM

## 2014-11-06 NOTE — Progress Notes (Signed)
Patient ID: Sarah Donaldson, female   DOB: 07-28-1951, 64 y.o.   MRN: [id number]  DOS: 10/01/14 R chielectomy  Subjective: Ms. Mchan presents the office today four -weeks status post right foot cheilectomy. She states that she continues have some discomfort and swelling overlying the big toe joint over the site of the surgery. She does that that the pain has decreased since she is able to move her toe more now without as much discomfort. She does continue with surgical shoe as she states that she is "chicken" to return into a regular shoe. She does that she stays very active on her feet even with a surgical shoe as the end of day the area does become more painful as it swells. She denies any overlying redness or any increase in warmth of the surgery. She denies any systemic complaints such as fevers, chills, nausea, vomiting. Denies any calf pain, chest pain, shortness of breath. No other complaints at this time in no acute changes since last appointment.  Objective: AAO 3, NAD DP/PT pulses palpable, CRT less than 3 seconds Protective sensation intact with Simms Weinstein monofilament, vibratory sensation intact, Achilles tendon reflex intact. Incision onthe dorsal medial aspect of the right foot as well coapted without any evidence of dehiscence. There is no surrounding erythema, ascending cellulitis, drainage/purulence, malodor, or any other clinical signs of infection. There is mild pain with range of motion of the first MTPJ and there is mild edema overlying the joint. There is no erythema or increase in warmth over the joint. No other areas of tenderness to bilateral lower extremities. No other areas of edema, erythema, increase in warmth. No pain with calf compression, swelling, warmth, erythema. No other open lesions or pre-ulcerative lesions are identified.  Assessment: 64 year old female status post right foot cheilectomy  Plan: -Treatment options were discussed including alternatives,  risks, complications. -Discussed the patient she consented transition to regular she was tolerated. Also discussed with her that she may need to limit her activities as it seems that she is very active and it may be too much this soon close the surgery date. She can start to slowly increase her activity as tolerated. -Dispensed a graphite insert to wear in her regular shoe. -Pain medication as needed. -Ice and elevation. -I discussed with her in the future at the joint itself remains symptomatic over the next several months may benefit from an arthroplasty/implant. -Follow-up in four weeks or sooner should any problems arise. In the meantime encouraged to call the office they question, concerns, change in symptoms.

## 2014-11-09 DIAGNOSIS — M9905 Segmental and somatic dysfunction of pelvic region: Secondary | ICD-10-CM | POA: Diagnosis not present

## 2014-11-09 DIAGNOSIS — M955 Acquired deformity of pelvis: Secondary | ICD-10-CM | POA: Diagnosis not present

## 2014-11-09 DIAGNOSIS — M9903 Segmental and somatic dysfunction of lumbar region: Secondary | ICD-10-CM | POA: Diagnosis not present

## 2014-11-09 DIAGNOSIS — M5432 Sciatica, left side: Secondary | ICD-10-CM | POA: Diagnosis not present

## 2014-11-23 ENCOUNTER — Other Ambulatory Visit: Payer: Self-pay | Admitting: Unknown Physician Specialty

## 2014-11-23 DIAGNOSIS — D440 Neoplasm of uncertain behavior of thyroid gland: Secondary | ICD-10-CM

## 2014-11-27 DIAGNOSIS — M955 Acquired deformity of pelvis: Secondary | ICD-10-CM | POA: Diagnosis not present

## 2014-11-27 DIAGNOSIS — M5432 Sciatica, left side: Secondary | ICD-10-CM | POA: Diagnosis not present

## 2014-11-27 DIAGNOSIS — M9903 Segmental and somatic dysfunction of lumbar region: Secondary | ICD-10-CM | POA: Diagnosis not present

## 2014-11-27 DIAGNOSIS — M9905 Segmental and somatic dysfunction of pelvic region: Secondary | ICD-10-CM | POA: Diagnosis not present

## 2014-11-29 ENCOUNTER — Ambulatory Visit
Admit: 2014-11-29 | Disposition: A | Discharge status: Home / Self Care | Payer: Self-pay | Attending: Oncology | Admitting: Oncology

## 2014-11-29 DIAGNOSIS — C8590 Non-Hodgkin lymphoma, unspecified, unspecified site: Secondary | ICD-10-CM | POA: Diagnosis not present

## 2014-11-29 DIAGNOSIS — K449 Diaphragmatic hernia without obstruction or gangrene: Secondary | ICD-10-CM | POA: Diagnosis not present

## 2014-11-29 DIAGNOSIS — C8298 Follicular lymphoma, unspecified, lymph nodes of multiple sites: Secondary | ICD-10-CM | POA: Diagnosis not present

## 2014-11-29 DIAGNOSIS — E041 Nontoxic single thyroid nodule: Secondary | ICD-10-CM | POA: Diagnosis not present

## 2014-11-30 NOTE — Op Note (Signed)
PATIENT NAME:  Sarah Donaldson, Sarah Donaldson MR#:  [id number] DATE OF BIRTH:  1950/10/11  DATE OF PROCEDURE:  07/26/2012  PREOPERATIVE DIAGNOSIS: Right multinodular parotid mass.   POSTOPERATIVE DIAGNOSIS: Right multinodular parotid mass.   OPERATION PERFORMED: Right superficial parotidectomy.   ATTENDING SURGEON: Roena Malady, MD.   ASSISTANT SURGEON: Dr. Pryor Ochoa.    OPERATIVE FINDINGS: Multinodular masses within the right parotid gland. There was diffuse splaying of the facial nerve. The facial nerve remained intact throughout the case.   DESCRIPTION OF PROCEDURE: The patient was identified in the holding area, taken to the operating room and placed in the supine position. After general endotracheal anesthesia, the table was turned 90 degrees. The right face and neck were prepped sterilely. An incision line was marked in the preauricular crease down onto the upper neck. A local anesthetic of 1% lidocaine with 1:100,000 epinephrine was used to inject along this line. A total of 3 mL was used. With this completed and the face prepped and draped sterilely, a 15 blade was used to incise down to the superficial muscular aponeurotic system or SMAS. A superior SMAS flap was elevated out onto the face. In the subcutaneous fat down in the superficial lobe parotid, we could see a mass protruding up into the subcutaneous tissues. We took great care to stay away from this mass and preserve it with the specimen. With flaps elevated and retracted, hemostasis was achieved using the Bovie cautery. Dissection proceeded in the pretragal plane. There were multiple small bleeding vessels there which were divided using the Harmonic or the microbipolar. The dissection proceeded deep down the tympanosquamous suture line. The posterior belly of the digastric was identified. We had some difficulty identifying the facial nerve using the facial stimulator. Finally, we did find the nerve approximately 0.5 cm deeper than is usually  found. The nerve was also significantly flattened out and splayed as it was traced outward. All branches were found, identified and preserved but the nerve was splayed. As it moved anteriorly, we dissected the gland off of the nerve. There were 2 to 3 branches of the nerve that went beneath the retromandibular vein and 2 to 3 branches that went superior to the retromandibular vein. All of these were preserved. There were several small bleeding vessels which were divided using the using the microbipolar and the Harmonic. As we dissected superiorly, there were multiple nodules within the superficial lobe which we preserved. The dissection then proceeded anteriorly, removing the superficial lobe off of the facial nerve. This was then marked by placing a stitch in the tail of the gland and that superficial mass that we found was inked. I spoke to Dr. Reuel Derby about this for identification. With the superficial lobe removed, the wound was copiously irrigated with saline. Any small bleeding vessels were cauterized using the microbipolar. All branches of the facial nerve, including the main trunk, stimulated at the completion of the case. The SMAS was then reapproximated posteriorly using 4-0 Vicryl. A #7 Jackson-Pratt drain was brought out of the wound inferiorly. The subcutaneous layers were closed using interrupted 4-0 Vicryl, and the skin was closed using Dermabond. A 2-0 silk was used as a drain stitch to keep the drain in position. The patient was then returned to anesthesia where she was awakened in the operating room and taken to the recovery room in stable condition.   CULTURES: None.   SPECIMENS: Right parotid mass and right parotid superficial lobe.   ESTIMATED BLOOD LOSS: Approximately 50 mL.  ____________________________ Roena Malady, MD ctm:gb D: 07/26/2012 12:40:18 ET T: 07/26/2012 22:05:25 ET JOB#: [id number]  cc: Roena Malady, MD, <Dictator> Roena Malady MD ELECTRONICALLY  SIGNED 08/24/2012 8:46

## 2014-12-03 ENCOUNTER — Ambulatory Visit
Admit: 2014-12-03 | Disposition: A | Discharge status: Home / Self Care | Payer: Self-pay | Attending: Oncology | Admitting: Oncology

## 2014-12-03 DIAGNOSIS — Z87891 Personal history of nicotine dependence: Secondary | ICD-10-CM | POA: Diagnosis not present

## 2014-12-03 DIAGNOSIS — N840 Polyp of corpus uteri: Secondary | ICD-10-CM | POA: Diagnosis not present

## 2014-12-03 DIAGNOSIS — Z79899 Other long term (current) drug therapy: Secondary | ICD-10-CM | POA: Diagnosis not present

## 2014-12-03 DIAGNOSIS — E780 Pure hypercholesterolemia: Secondary | ICD-10-CM | POA: Diagnosis not present

## 2014-12-03 DIAGNOSIS — R5383 Other fatigue: Secondary | ICD-10-CM | POA: Diagnosis not present

## 2014-12-03 DIAGNOSIS — G20 Parkinson's disease: Secondary | ICD-10-CM | POA: Diagnosis not present

## 2014-12-03 DIAGNOSIS — E041 Nontoxic single thyroid nodule: Secondary | ICD-10-CM | POA: Diagnosis not present

## 2014-12-03 DIAGNOSIS — C8211 Follicular lymphoma grade II, lymph nodes of head, face, and neck: Secondary | ICD-10-CM | POA: Diagnosis not present

## 2014-12-03 DIAGNOSIS — K449 Diaphragmatic hernia without obstruction or gangrene: Secondary | ICD-10-CM | POA: Diagnosis not present

## 2014-12-03 LAB — COMPREHENSIVE METABOLIC PANEL
Albumin: 4.2 g/dL
Alkaline Phosphatase: 89 U/L
Anion Gap: 5 — ABNORMAL LOW (ref 7–16)
BUN: 21 mg/dL — ABNORMAL HIGH
Bilirubin,Total: 0.8 mg/dL
Calcium, Total: 9.1 mg/dL
Chloride: 106 mmol/L
Co2: 28 mmol/L
Creatinine: 0.65 mg/dL
EGFR (African American): >60
EGFR (Non-African Amer.): >60
Glucose: 94 mg/dL
Potassium: 3.6 mmol/L
SGOT(AST): 19 U/L
SGPT (ALT): <5 U/L — ABNORMAL LOW
Sodium: 139 mmol/L
Total Protein: 6.9 g/dL

## 2014-12-03 LAB — CBC CANCER CENTER
Basophil #: 0 x10 3/mm (ref 0.0–0.1)
Basophil %: 0.7 %
Eosinophil #: 0.1 x10 3/mm (ref 0.0–0.7)
Eosinophil %: 2.6 %
HCT: 45.7 % (ref 35.0–47.0)
HGB: 15 g/dL (ref 12.0–16.0)
Lymphocyte #: 1.1 x10 3/mm (ref 1.0–3.6)
Lymphocyte %: 20.6 %
MCH: 29 pg (ref 26.0–34.0)
MCHC: 32.8 g/dL (ref 32.0–36.0)
MCV: 88 fL (ref 80–100)
Monocyte #: 0.6 x10 3/mm (ref 0.2–0.9)
Monocyte %: 10.3 %
Neutrophil #: 3.5 x10 3/mm (ref 1.4–6.5)
Neutrophil %: 65.8 %
Platelet: 198 x10 3/mm (ref 150–440)
RBC: 5.18 10*6/uL (ref 3.80–5.20)
RDW: 13.5 % (ref 11.5–14.5)
WBC: 5.4 x10 3/mm (ref 3.6–11.0)

## 2014-12-03 LAB — SURGICAL PATHOLOGY

## 2014-12-03 LAB — LACTATE DEHYDROGENASE: LDH: 137 U/L

## 2014-12-06 ENCOUNTER — Ambulatory Visit: Payer: Medicare Other

## 2014-12-06 ENCOUNTER — Ambulatory Visit (INDEPENDENT_AMBULATORY_CARE_PROVIDER_SITE_OTHER): Payer: Medicare Other | Admitting: Podiatry

## 2014-12-06 DIAGNOSIS — M9905 Segmental and somatic dysfunction of pelvic region: Secondary | ICD-10-CM | POA: Diagnosis not present

## 2014-12-06 DIAGNOSIS — G20 Parkinson's disease: Secondary | ICD-10-CM | POA: Diagnosis not present

## 2014-12-06 DIAGNOSIS — M955 Acquired deformity of pelvis: Secondary | ICD-10-CM | POA: Diagnosis not present

## 2014-12-06 DIAGNOSIS — Z9889 Other specified postprocedural states: Secondary | ICD-10-CM

## 2014-12-06 DIAGNOSIS — M205X1 Other deformities of toe(s) (acquired), right foot: Secondary | ICD-10-CM

## 2014-12-06 DIAGNOSIS — M9903 Segmental and somatic dysfunction of lumbar region: Secondary | ICD-10-CM | POA: Diagnosis not present

## 2014-12-06 DIAGNOSIS — M5432 Sciatica, left side: Secondary | ICD-10-CM | POA: Diagnosis not present

## 2014-12-06 DIAGNOSIS — G4752 REM sleep behavior disorder: Secondary | ICD-10-CM | POA: Diagnosis not present

## 2014-12-06 DIAGNOSIS — G479 Sleep disorder, unspecified: Secondary | ICD-10-CM | POA: Diagnosis not present

## 2014-12-06 DIAGNOSIS — R251 Tremor, unspecified: Secondary | ICD-10-CM | POA: Diagnosis not present

## 2014-12-07 ENCOUNTER — Other Ambulatory Visit: Payer: Self-pay | Admitting: *Deleted

## 2014-12-07 MED ORDER — TAVABOROLE 5 % EX SOLN: 1.0000 [drp] | CUTANEOUS | Status: DC

## 2014-12-07 NOTE — Telephone Encounter (Signed)
REFILL KERYDIN

## 2014-12-09 NOTE — Op Note (Signed)
PATIENT NAME:  Sarah Donaldson, Sarah Donaldson MR#:  [id number] DATE OF BIRTH:  Oct 24, 1950  DATE OF PROCEDURE:  08/31/2014  PREOPERATIVE DIAGNOSIS: Large mass, hypothenar eminence, right hand.   POSTOPERATIVE DIAGNOSIS: Large lipoma, hypothenar eminence, right hand.   PROCEDURE: Excision of lipoma, right hand.   SURGEON: Lucas Mallow, MD   ANESTHESIA: General.   COMPLICATIONS: None.   TOURNIQUET TIME: Approximately 15 minutes.   DESCRIPTION OF PROCEDURE: After adequate induction of general anesthesia, the right upper extremity was thoroughly prepped with alcohol and ChloraPrep, and draped in a standard sterile fashion. The extremity was wrapped out with the Esmarch bandage, and the pneumatic tourniquet elevated to 250 mmHg. Under loupe magnification, an incision is made from the proximal ulnar wrist, extending in a zigzag fashion out into the hypothenar eminence. The dissection is carried down proximally, and the flexor carpi ulnaris and the ulnar nerve and artery were identified proximally. This dissection was carried out distally into the hypothenar eminence, completely dissecting out the artery and the branches of the ulnar nerve. The pathology was seen to be a large lipoma, which was 6 cm x 3 cm. The ulnar nerve was carefully dissected off of the lipoma, and then the lipoma was easily shelled out and this specimen was sent to pathology. There was no evidence of malignancy.   The wound was thoroughly irrigated multiple times. Proximal ulnar nerve block was performed with 0.5% plain Marcaine. The skin was closed with 4-0 nylon sutures. Soft bulky dressing was applied, along with a volar splint. The tourniquet was released. The patient was returned to the recovery room in satisfactory condition, having tolerated the procedure quite well.    ____________________________ Lucas Mallow, MD ces:MT D: 09/01/2014 10:33:33 ET T: 09/01/2014 11:38:59 ET JOB#: [id number]  cc: Lucas Mallow,  MD, <Dictator> Lucas Mallow MD ELECTRONICALLY SIGNED 09/04/2014 10:44

## 2014-12-10 NOTE — Progress Notes (Signed)
Patient ID: Sarah Donaldson, female   DOB: 1951/05/19, 64 y.o.   MRN: [id number]  DOS: 10/01/14 R chielectomy  Subjective: [name] presents the office today status post right foot cheilectomy. She states that she continues have pain and intermittent swelling overlying the big toe joint over the site of the surgery. She has since returned to regular shoes which she states is painful. She does that she stays very active on her feet even with a surgical shoe as the end of day the area does become more painful as it swells. She denies any overlying redness or any increase in warmth to the area. She denies any systemic complaints such as fevers, chills, nausea, vomiting. Denies any calf pain, chest pain, shortness of breath. No other complaints at this time in no acute changes since last appointment.  Objective: AAO 3, NAD; presents wearing a flat sandal  DP/PT pulses palpable, CRT less than 3 seconds Protective sensation intact with Simms Weinstein monofilament, vibratory sensation intact, Achilles tendon reflex intact. Incision onthe dorsal medial aspect of the right foot as well coapted without any evidence of dehiscence. There is no surrounding erythema, ascending cellulitis, drainage/purulence, malodor, or any other clinical signs of infection. There is mild pain with range of motion of the first MTPJ and there is mild edema overlying the joint. There is no erythema or increase in warmth over the joint. No other areas of tenderness to bilateral lower extremities. No other areas of edema, erythema, increase in warmth. No pain with calf compression, swelling, warmth, erythema. No other open lesions or pre-ulcerative lesions are identified.  Assessment: 64 year old female status post right foot cheilectomy; with continued pain.   Plan: -Treatment options were discussed including alternatives, risks, complications. -I again discussed with her an orthotic with Morton's extension to help support the  1st MTPJ vs surgical revision with implant arthroplsty. She will start with the insert. A prescription for this was given to her.  -Pain medication as needed. -Ice and elevation. -Discussed shoegear modifications  -Follow-up after orthotic or sooner should any problems arise. In the meantime encouraged to call the office they question, concerns, change in symptoms.

## 2014-12-11 ENCOUNTER — Telehealth: Payer: Self-pay | Admitting: *Deleted

## 2014-12-12 NOTE — Telephone Encounter (Signed)
I faxed this over Friday, but will resend it again today.

## 2014-12-12 NOTE — Telephone Encounter (Signed)
Patient seen yesterday.  We need a prescription and notes faxed to [place].  Patient forgot prescription.  Fax over to [place] please.  Fax number is [phone].

## 2014-12-13 DIAGNOSIS — M5432 Sciatica, left side: Secondary | ICD-10-CM | POA: Diagnosis not present

## 2014-12-13 DIAGNOSIS — M9905 Segmental and somatic dysfunction of pelvic region: Secondary | ICD-10-CM | POA: Diagnosis not present

## 2014-12-13 DIAGNOSIS — M9903 Segmental and somatic dysfunction of lumbar region: Secondary | ICD-10-CM | POA: Diagnosis not present

## 2014-12-13 DIAGNOSIS — M955 Acquired deformity of pelvis: Secondary | ICD-10-CM | POA: Diagnosis not present

## 2014-12-25 DIAGNOSIS — M9903 Segmental and somatic dysfunction of lumbar region: Secondary | ICD-10-CM | POA: Diagnosis not present

## 2014-12-25 DIAGNOSIS — M9905 Segmental and somatic dysfunction of pelvic region: Secondary | ICD-10-CM | POA: Diagnosis not present

## 2014-12-25 DIAGNOSIS — M955 Acquired deformity of pelvis: Secondary | ICD-10-CM | POA: Diagnosis not present

## 2014-12-25 DIAGNOSIS — M5432 Sciatica, left side: Secondary | ICD-10-CM | POA: Diagnosis not present

## 2015-01-01 DIAGNOSIS — L7452 Secondary focal hyperhidrosis: Secondary | ICD-10-CM | POA: Diagnosis not present

## 2015-01-01 DIAGNOSIS — H6121 Impacted cerumen, right ear: Secondary | ICD-10-CM | POA: Diagnosis not present

## 2015-01-09 DIAGNOSIS — M955 Acquired deformity of pelvis: Secondary | ICD-10-CM | POA: Diagnosis not present

## 2015-01-09 DIAGNOSIS — M9903 Segmental and somatic dysfunction of lumbar region: Secondary | ICD-10-CM | POA: Diagnosis not present

## 2015-01-09 DIAGNOSIS — M9905 Segmental and somatic dysfunction of pelvic region: Secondary | ICD-10-CM | POA: Diagnosis not present

## 2015-01-09 DIAGNOSIS — M5432 Sciatica, left side: Secondary | ICD-10-CM | POA: Diagnosis not present

## 2015-01-11 DIAGNOSIS — M955 Acquired deformity of pelvis: Secondary | ICD-10-CM | POA: Diagnosis not present

## 2015-01-11 DIAGNOSIS — M5432 Sciatica, left side: Secondary | ICD-10-CM | POA: Diagnosis not present

## 2015-01-11 DIAGNOSIS — M9905 Segmental and somatic dysfunction of pelvic region: Secondary | ICD-10-CM | POA: Diagnosis not present

## 2015-01-11 DIAGNOSIS — M9903 Segmental and somatic dysfunction of lumbar region: Secondary | ICD-10-CM | POA: Diagnosis not present

## 2015-01-21 ENCOUNTER — Ambulatory Visit
Admission: RE | Admit: 2015-01-21 | Discharge: 2015-01-21 | Disposition: A | Discharge status: Home / Self Care | Payer: Medicare Other | Source: Ambulatory Visit | Attending: Unknown Physician Specialty | Admitting: Unknown Physician Specialty

## 2015-01-21 DIAGNOSIS — E041 Nontoxic single thyroid nodule: Secondary | ICD-10-CM | POA: Diagnosis not present

## 2015-01-21 DIAGNOSIS — D440 Neoplasm of uncertain behavior of thyroid gland: Secondary | ICD-10-CM

## 2015-01-26 ENCOUNTER — Encounter: Payer: Self-pay | Admitting: Emergency Medicine

## 2015-01-26 ENCOUNTER — Emergency Department
Admission: EM | Admit: 2015-01-26 | Discharge: 2015-01-26 | Disposition: A | Discharge status: Home / Self Care | Payer: Medicare Other | Attending: Emergency Medicine | Admitting: Emergency Medicine

## 2015-01-26 DIAGNOSIS — Y998 Other external cause status: Secondary | ICD-10-CM | POA: Insufficient documentation

## 2015-01-26 DIAGNOSIS — Z79899 Other long term (current) drug therapy: Secondary | ICD-10-CM | POA: Insufficient documentation

## 2015-01-26 DIAGNOSIS — Z885 Allergy status to narcotic agent status: Secondary | ICD-10-CM | POA: Insufficient documentation

## 2015-01-26 DIAGNOSIS — Y288XXA Contact with other sharp object, undetermined intent, initial encounter: Secondary | ICD-10-CM | POA: Diagnosis not present

## 2015-01-26 DIAGNOSIS — Z87891 Personal history of nicotine dependence: Secondary | ICD-10-CM | POA: Diagnosis not present

## 2015-01-26 DIAGNOSIS — Y9289 Other specified places as the place of occurrence of the external cause: Secondary | ICD-10-CM | POA: Insufficient documentation

## 2015-01-26 DIAGNOSIS — Y93E3 Activity, vacuuming: Secondary | ICD-10-CM | POA: Insufficient documentation

## 2015-01-26 DIAGNOSIS — S0181XA Laceration without foreign body of other part of head, initial encounter: Secondary | ICD-10-CM | POA: Insufficient documentation

## 2015-01-26 HISTORY — DX: Malignant (primary) neoplasm, unspecified: C80.1

## 2015-01-26 HISTORY — DX: Parkinson's disease: G20

## 2015-01-26 HISTORY — DX: Parkinson's disease without dyskinesia, without mention of fluctuations: G20.A1

## 2015-01-26 NOTE — ED Notes (Signed)
Patient to ER for laceration to forehead. Patient was vacuuming and bent down, hit something that was hanging on wall. Bleeding controlled. Denies being on blood thinners.

## 2015-01-26 NOTE — ED Provider Notes (Signed)
[facility] Emergency Department Provider Note  ____________________________________________  Time seen: Approximately 2:03 PM  I have reviewed the triage vital signs and the nursing notes.   HISTORY  Chief Complaint Laceration    HPI Sarah Donaldson is a 64 y.o. female with a laceration to her forehead. Patient states she is outside vacuum bent over she came up she hit a pot that was hanging on the wall. He denies any loss of consciousness and hemorrhage is controlled. Patient states she's not on any blood thinners. Patient rates the pain as a 3 over 5. Denies any vision loss.   Past Medical History  Diagnosis Date  . Parkinson disease   . Cancer     Non-hodgkin's lymphoma (in remission)    There are no active problems to display for this patient.   Past Surgical History  Procedure Laterality Date  . Retinal detachment surgery    . Bunionectomy      Current Outpatient Rx  Name  Route  Sig  Dispense  Refill  . carbidopa-levodopa (SINEMET CR) 50-200 MG per tablet            6   . clonazePAM (KLONOPIN) 1 MG tablet               . HYDROcodone-acetaminophen (NORCO/VICODIN) 5-325 MG per tablet   Oral   Take 1 tablet by mouth every 6 (six) hours as needed.   40 tablet   0   . omeprazole (PRILOSEC) 20 MG capsule   Oral   Take 20 mg by mouth daily.      3   . pramipexole (MIRAPEX) 1.5 MG tablet   Oral   Take 1.5 mg by mouth 3 (three) times daily.      5   . Tavaborole (KERYDIN) 5 % SOLN   Apply externally   Apply 1 drop topically 1 day or 1 dose. Apply 1 drop to the toenail daily.   1 Bottle   9     Allergies Codeine sulfate  No family history on file.  Social History History  Substance Use Topics  . Smoking status: Former Research scientist (life sciences)  . Smokeless tobacco: Not on file  . Alcohol Use: 0.0 oz/week    0 Standard drinks or equivalent per week    Review of Systems Constitutional: No fever/chills Eyes: No visual  changes. ENT: No sore throat. Cardiovascular: Denies chest pain. Respiratory: Denies shortness of breath. Gastrointestinal: No abdominal pain.  No nausea, no vomiting.  No diarrhea.  No constipation. Genitourinary: Negative for dysuria. Musculoskeletal: Negative for back pain. Skin: 40 laceration. Neurological: Negative for headaches, focal weakness or numbness. Allergic/Immunilogical: Codeine. 10-point ROS otherwise negative.  ____________________________________________   PHYSICAL EXAM:  VITAL SIGNS: ED Triage Vitals  Enc Vitals Group     BP 01/26/15 1232 121/87 mmHg     Pulse Rate 01/26/15 1232 93     Resp 01/26/15 1232 18     Temp 01/26/15 1232 98 F (36.7 C)     Temp Source 01/26/15 1232 Oral     SpO2 01/26/15 1232 95 %     Weight 01/26/15 1232 170 lb (77.111 kg)     Height 01/26/15 1232 5' 6.5" (1.689 m)     Head Cir --      Peak Flow --      Pain Score --      Pain Loc --      Pain Edu? --      Excl. in [place]? --  Constitutional: Alert and oriented. Well appearing and in no acute distress. Eyes: Conjunctivae are normal. PERRL. EOMI. Head: Atraumatic. Nose: No congestion/rhinnorhea. Mouth/Throat: Mucous membranes are moist.  Oropharynx non-erythematous. Neck: No stridor.  No deformity for nuchal range of motion nontender palpation. Hematological/Lymphatic/Immunilogical: No cervical lymphadenopathy. Cardiovascular: Normal rate, regular rhythm. Grossly normal heart sounds.  Good peripheral circulation. Respiratory: Normal respiratory effort.  No retractions. Lungs CTAB. Gastrointestinal: Soft and nontender. No distention. No abdominal bruits. No CVA tenderness. Musculoskeletal: No lower extremity tenderness nor edema.  No joint effusions. Neurologic:  Normal speech and language. No gross focal neurologic deficits are appreciated. Speech is normal. No gait instability. Skin:  Skin is warm, dry and intact. No rash noted. 2.5 shallow laceration to the center of the  forehead. Psychiatric: Mood and affect are normal. Speech and behavior are normal.  ____________________________________________   LABS (all labs ordered are listed, but only abnormal results are displayed)  Labs Reviewed - No data to display ____________________________________________  EKG   ____________________________________________  RADIOLOGY   ____________________________________________   PROCEDURES  Procedure(s) performed: See procedure note.  Critical Care performed: No  ____________________________________________   INITIAL IMPRESSION / ASSESSMENT AND PLAN / ED COURSE  Pertinent labs & imaging results that were available during my care of the patient were reviewed by me and considered in my medical decision making (see chart for details).  Laceration. LACERATION REPAIR Performed by: Sable Feil Authorized by: Sable Feil Consent: Verbal consent obtained. Risks and benefits: risks, benefits and alternatives were discussed Consent given by: patient Patient identity confirmed: provided demographic data Prepped and Draped in normal sterile fashion Wound explored  Laceration Location: Center of for head  Laceration Length: 2.5 cm  No Foreign Bodies seen or palpated  Anesthesia: None   Local anesthetic: None Anesthetic total: None Irrigation method: syringe Amount of cleaning: standard  Skin closure: Dermabond Number of sutures none Technique: Patient tolerance: Patient tolerated the procedure well with no immediate complications.  ____________________________________________   FINAL CLINICAL IMPRESSION(S) / ED DIAGNOSES  Final diagnoses:  Laceration of forehead without complication, initial encounter      Sable Feil, PA-C 01/26/15 [place], PA-C 01/26/15 1420  Gregor Hams, MD 01/28/15 2238

## 2015-01-26 NOTE — Discharge Instructions (Signed)
Facial Laceration °A facial laceration is a cut on the face. These injuries can be painful and cause bleeding. Some cuts may need to be closed with stitches (sutures), skin adhesive strips, or wound glue. Cuts usually heal quickly but can leave a scar. It can take 1-2 years for the scar to go away completely. °HOME CARE  °· Only take medicines as told by your doctor. °· Follow your doctor's instructions for wound care. °For Stitches: °· Keep the cut clean and dry. °· If you have a bandage (dressing), change it at least once a day. Change the bandage if it gets wet or dirty, or as told by your doctor. °· Wash the cut with soap and water 2 times a day. Rinse the cut with water. Pat it dry with a clean towel. °· Put a thin layer of medicated cream on the cut as told by your doctor. °· You may shower after the first 24 hours. Do not soak the cut in water until the stitches are removed. °· Have your stitches removed as told by your doctor. °· Do not wear any makeup until a few days after your stitches are removed. °For Skin Adhesive Strips: °· Keep the cut clean and dry. °· Do not get the strips wet. You may take a bath, but be careful to keep the cut dry. °· If the cut gets wet, pat it dry with a clean towel. °· The strips will fall off on their own. Do not remove the strips that are still stuck to the cut. °For Wound Glue: °· You may shower or take baths. Do not soak or scrub the cut. Do not swim. Avoid heavy sweating until the glue falls off on its own. After a shower or bath, pat the cut dry with a clean towel. °· Do not put medicine or makeup on your cut until the glue falls off. °· If you have a bandage, do not put tape over the glue. °· Avoid lots of sunlight or tanning lamps until the glue falls off. °· The glue will fall off on its own in 5-10 days. Do not pick at the glue. °After Healing: °Put sunscreen on the cut for the first year to reduce your scar. °GET HELP RIGHT AWAY IF:  °· Your cut area gets red,  painful, or puffy (swollen). °· You see a yellowish-white fluid (pus) coming from the cut. °· You have chills or a fever. °MAKE SURE YOU:  °· Understand these instructions. °· Will watch your condition. °· Will get help right away if you are not doing well or get worse. °Document Released: 01/13/2008 Document Revised: 05/17/2013 Document Reviewed: 03/09/2013 °[employer]® Patient Information ©2015 [employer]. This information is not intended to replace advice given to you by your health care provider. Make sure you discuss any questions you have with your health care provider. ° °

## 2015-01-29 DIAGNOSIS — M955 Acquired deformity of pelvis: Secondary | ICD-10-CM | POA: Diagnosis not present

## 2015-01-29 DIAGNOSIS — M9903 Segmental and somatic dysfunction of lumbar region: Secondary | ICD-10-CM | POA: Diagnosis not present

## 2015-01-29 DIAGNOSIS — M5432 Sciatica, left side: Secondary | ICD-10-CM | POA: Diagnosis not present

## 2015-01-29 DIAGNOSIS — M9905 Segmental and somatic dysfunction of pelvic region: Secondary | ICD-10-CM | POA: Diagnosis not present

## 2015-02-04 ENCOUNTER — Other Ambulatory Visit: Payer: Medicare Other

## 2015-02-04 DIAGNOSIS — M5432 Sciatica, left side: Secondary | ICD-10-CM | POA: Diagnosis not present

## 2015-02-04 DIAGNOSIS — M955 Acquired deformity of pelvis: Secondary | ICD-10-CM | POA: Diagnosis not present

## 2015-02-04 DIAGNOSIS — M9903 Segmental and somatic dysfunction of lumbar region: Secondary | ICD-10-CM | POA: Diagnosis not present

## 2015-02-04 DIAGNOSIS — M9905 Segmental and somatic dysfunction of pelvic region: Secondary | ICD-10-CM | POA: Diagnosis not present

## 2015-02-12 DIAGNOSIS — M9905 Segmental and somatic dysfunction of pelvic region: Secondary | ICD-10-CM | POA: Diagnosis not present

## 2015-02-12 DIAGNOSIS — M5432 Sciatica, left side: Secondary | ICD-10-CM | POA: Diagnosis not present

## 2015-02-12 DIAGNOSIS — M955 Acquired deformity of pelvis: Secondary | ICD-10-CM | POA: Diagnosis not present

## 2015-02-12 DIAGNOSIS — M9903 Segmental and somatic dysfunction of lumbar region: Secondary | ICD-10-CM | POA: Diagnosis not present

## 2015-02-13 DIAGNOSIS — G479 Sleep disorder, unspecified: Secondary | ICD-10-CM | POA: Diagnosis not present

## 2015-02-13 DIAGNOSIS — G4752 REM sleep behavior disorder: Secondary | ICD-10-CM | POA: Diagnosis not present

## 2015-02-13 DIAGNOSIS — G20 Parkinson's disease: Secondary | ICD-10-CM | POA: Diagnosis not present

## 2015-02-13 DIAGNOSIS — M5440 Lumbago with sciatica, unspecified side: Secondary | ICD-10-CM | POA: Insufficient documentation

## 2015-02-13 DIAGNOSIS — R251 Tremor, unspecified: Secondary | ICD-10-CM | POA: Diagnosis not present

## 2015-02-14 DIAGNOSIS — M9905 Segmental and somatic dysfunction of pelvic region: Secondary | ICD-10-CM | POA: Diagnosis not present

## 2015-02-14 DIAGNOSIS — M955 Acquired deformity of pelvis: Secondary | ICD-10-CM | POA: Diagnosis not present

## 2015-02-14 DIAGNOSIS — M9903 Segmental and somatic dysfunction of lumbar region: Secondary | ICD-10-CM | POA: Diagnosis not present

## 2015-02-14 DIAGNOSIS — M5432 Sciatica, left side: Secondary | ICD-10-CM | POA: Diagnosis not present

## 2015-02-18 DIAGNOSIS — H209 Unspecified iridocyclitis: Secondary | ICD-10-CM | POA: Diagnosis not present

## 2015-02-22 DIAGNOSIS — M5432 Sciatica, left side: Secondary | ICD-10-CM | POA: Diagnosis not present

## 2015-02-22 DIAGNOSIS — M9905 Segmental and somatic dysfunction of pelvic region: Secondary | ICD-10-CM | POA: Diagnosis not present

## 2015-02-22 DIAGNOSIS — M955 Acquired deformity of pelvis: Secondary | ICD-10-CM | POA: Diagnosis not present

## 2015-02-22 DIAGNOSIS — M9903 Segmental and somatic dysfunction of lumbar region: Secondary | ICD-10-CM | POA: Diagnosis not present

## 2015-03-04 DIAGNOSIS — M9903 Segmental and somatic dysfunction of lumbar region: Secondary | ICD-10-CM | POA: Diagnosis not present

## 2015-03-04 DIAGNOSIS — M955 Acquired deformity of pelvis: Secondary | ICD-10-CM | POA: Diagnosis not present

## 2015-03-04 DIAGNOSIS — M9905 Segmental and somatic dysfunction of pelvic region: Secondary | ICD-10-CM | POA: Diagnosis not present

## 2015-03-04 DIAGNOSIS — M5432 Sciatica, left side: Secondary | ICD-10-CM | POA: Diagnosis not present

## 2015-03-21 DIAGNOSIS — M5432 Sciatica, left side: Secondary | ICD-10-CM | POA: Diagnosis not present

## 2015-03-21 DIAGNOSIS — M9903 Segmental and somatic dysfunction of lumbar region: Secondary | ICD-10-CM | POA: Diagnosis not present

## 2015-03-21 DIAGNOSIS — M955 Acquired deformity of pelvis: Secondary | ICD-10-CM | POA: Diagnosis not present

## 2015-03-21 DIAGNOSIS — M9905 Segmental and somatic dysfunction of pelvic region: Secondary | ICD-10-CM | POA: Diagnosis not present

## 2015-04-01 DIAGNOSIS — M5432 Sciatica, left side: Secondary | ICD-10-CM | POA: Diagnosis not present

## 2015-04-01 DIAGNOSIS — M955 Acquired deformity of pelvis: Secondary | ICD-10-CM | POA: Diagnosis not present

## 2015-04-01 DIAGNOSIS — M9905 Segmental and somatic dysfunction of pelvic region: Secondary | ICD-10-CM | POA: Diagnosis not present

## 2015-04-01 DIAGNOSIS — M9903 Segmental and somatic dysfunction of lumbar region: Secondary | ICD-10-CM | POA: Diagnosis not present

## 2015-04-02 DIAGNOSIS — M955 Acquired deformity of pelvis: Secondary | ICD-10-CM | POA: Diagnosis not present

## 2015-04-02 DIAGNOSIS — M5432 Sciatica, left side: Secondary | ICD-10-CM | POA: Diagnosis not present

## 2015-04-02 DIAGNOSIS — M9905 Segmental and somatic dysfunction of pelvic region: Secondary | ICD-10-CM | POA: Diagnosis not present

## 2015-04-02 DIAGNOSIS — M9903 Segmental and somatic dysfunction of lumbar region: Secondary | ICD-10-CM | POA: Diagnosis not present

## 2015-04-05 ENCOUNTER — Other Ambulatory Visit: Payer: Self-pay | Admitting: *Deleted

## 2015-04-05 DIAGNOSIS — C8590 Non-Hodgkin lymphoma, unspecified, unspecified site: Secondary | ICD-10-CM

## 2015-04-08 ENCOUNTER — Inpatient Hospital Stay (HOSPITAL_BASED_OUTPATIENT_CLINIC_OR_DEPARTMENT_OTHER): Payer: Medicare Other | Admitting: Oncology

## 2015-04-08 ENCOUNTER — Inpatient Hospital Stay: Payer: Medicare Other | Attending: Oncology

## 2015-04-08 VITALS — BP 133/71 | HR 84 | Temp 96.6°F | Wt 168.4 lb

## 2015-04-08 DIAGNOSIS — Z9221 Personal history of antineoplastic chemotherapy: Secondary | ICD-10-CM

## 2015-04-08 DIAGNOSIS — H9190 Unspecified hearing loss, unspecified ear: Secondary | ICD-10-CM | POA: Diagnosis not present

## 2015-04-08 DIAGNOSIS — M549 Dorsalgia, unspecified: Secondary | ICD-10-CM | POA: Diagnosis not present

## 2015-04-08 DIAGNOSIS — Z87891 Personal history of nicotine dependence: Secondary | ICD-10-CM | POA: Diagnosis not present

## 2015-04-08 DIAGNOSIS — G8929 Other chronic pain: Secondary | ICD-10-CM | POA: Diagnosis not present

## 2015-04-08 DIAGNOSIS — C8211 Follicular lymphoma grade II, lymph nodes of head, face, and neck: Secondary | ICD-10-CM | POA: Insufficient documentation

## 2015-04-08 DIAGNOSIS — Z86010 Personal history of colonic polyps: Secondary | ICD-10-CM

## 2015-04-08 DIAGNOSIS — C8590 Non-Hodgkin lymphoma, unspecified, unspecified site: Secondary | ICD-10-CM

## 2015-04-08 DIAGNOSIS — Z79899 Other long term (current) drug therapy: Secondary | ICD-10-CM | POA: Diagnosis not present

## 2015-04-08 DIAGNOSIS — E780 Pure hypercholesterolemia: Secondary | ICD-10-CM | POA: Insufficient documentation

## 2015-04-08 DIAGNOSIS — G20 Parkinson's disease: Secondary | ICD-10-CM | POA: Diagnosis not present

## 2015-04-08 LAB — CBC WITH DIFFERENTIAL/PLATELET
Basophils Absolute: 0 10*3/uL (ref 0–0.1)
Basophils Relative: 1 %
Eosinophils Absolute: 0.1 10*3/uL (ref 0–0.7)
Eosinophils Relative: 2 %
HCT: 46 % (ref 35.0–47.0)
Hemoglobin: 15.4 g/dL (ref 12.0–16.0)
Lymphocytes Relative: 20 %
Lymphs Abs: 1.2 10*3/uL (ref 1.0–3.6)
MCH: 29.6 pg (ref 26.0–34.0)
MCHC: 33.5 g/dL (ref 32.0–36.0)
MCV: 88.3 fL (ref 80.0–100.0)
Monocytes Absolute: 0.5 10*3/uL (ref 0.2–0.9)
Monocytes Relative: 8 %
Neutro Abs: 4.2 10*3/uL (ref 1.4–6.5)
Neutrophils Relative %: 69 %
Platelets: 182 10*3/uL (ref 150–440)
RBC: 5.2 MIL/uL (ref 3.80–5.20)
RDW: 13.2 % (ref 11.5–14.5)
WBC: 6.1 10*3/uL (ref 3.6–11.0)

## 2015-04-08 LAB — COMPREHENSIVE METABOLIC PANEL
ALT: 6 U/L — ABNORMAL LOW (ref 14–54)
AST: 19 U/L (ref 15–41)
Albumin: 4.2 g/dL (ref 3.5–5.0)
Alkaline Phosphatase: 97 U/L (ref 38–126)
Anion gap: 4 — ABNORMAL LOW (ref 5–15)
BUN: 18 mg/dL (ref 6–20)
CO2: 28 mmol/L (ref 22–32)
Calcium: 8.9 mg/dL (ref 8.9–10.3)
Chloride: 106 mmol/L (ref 101–111)
Creatinine, Ser: 0.67 mg/dL (ref 0.44–1.00)
GFR calc Af Amer: >60 mL/min (ref >60)
GFR calc non Af Amer: >60 mL/min (ref >60)
Glucose, Bld: 115 mg/dL — ABNORMAL HIGH (ref 65–99)
Potassium: 4 mmol/L (ref 3.5–5.1)
Sodium: 138 mmol/L (ref 135–145)
Total Bilirubin: 0.5 mg/dL (ref 0.3–1.2)
Total Protein: 6.9 g/dL (ref 6.5–8.1)

## 2015-04-08 LAB — LACTATE DEHYDROGENASE: LDH: 150 U/L (ref 98–192)

## 2015-04-08 NOTE — Progress Notes (Signed)
Patient does not have living will.  Former smoker. 

## 2015-04-15 ENCOUNTER — Encounter: Payer: Self-pay | Admitting: Oncology

## 2015-04-15 NOTE — Progress Notes (Signed)
[place] @ [facility] [phone]  [phone]     Sarah Donaldson OB: [birth year]  MR#: [id number]  WYS#:[id number]  Patient Care Team: Cletis Athens, MD as PCP - General (Cardiology)  CHIEF COMPLAINT:  Chief Complaint/Diagnosis:   1. Superficial Protidectomy (right side) December 17/2013.  Consistent with low-grade CD10 and [id number] to follicular lymphoma grade 1-2.  Is positive for CD19 and cough of light chain.bone marrow aspiration and biopsy is negative .  PET scan was positive for some increased uptake in the spleen Stage IVA disease PET scan is consistent with bilateral neck lymph node (December, 2013) 2. Weekly rituxan has been started.  August 24, 2012 3. Patient finished the last dose of Rituxan on September 14 2012 4.on maintenance rituximab since April of 2014 patient has finished rituximabin December of 2015      INTERVAL HISTORY: 64 year old lady came today further follow-up regarding follicular lymphoma.  Status post Rituxan therapy. Patient had multiple other symptoms including arthritis.  No chills.  No fever.  No weight loss.  REVIEW OF SYSTEMS:   GENERAL:  Feels good.  Active.  No fevers, sweats or weight loss. PERFORMANCE STATUS (ECOG):  01 HEENT:  No visual changes, runny nose, sore throat, mouth sores or tenderness. Lungs: No shortness of breath or cough.  No hemoptysis. Cardiac:  No chest pain, palpitations, orthopnea, or PND. GI:  No nausea, vomiting, diarrhea, constipation, melena or hematochezia. GU:  No urgency, frequency, dysuria, or hematuria. Musculoskeletal:  No back pain.  No joint pain.  No muscle tenderness. Extremities:  No pain or swelling. Skin:  No rashes or skin changes. Neuro:  No headache, numbness or weakness, balance or coordination issues. Endocrine:  No diabetes, thyroid issues, hot flashes or night sweats. Psych:  No mood changes, depression or anxiety. Pain:  No focal pain. Review of systems:  All other  systems reviewed and found to be negative. As per HPI. Otherwise, a complete review of systems is negatve.  PAST MEDICAL HISTORY: Past Medical History  Diagnosis Date  . Parkinson disease   . Cancer     Non-hodgkin's lymphoma (in remission)    PAST SURGICAL HISTORY: Past Surgical History  Procedure Laterality Date  . Retinal detachment surgery    . Bunionectomy       Significant History/PMH:   Back Pain, Chronic:    Hard of Hearing/ Bilateral hearing aids:    Septoplasty:    Cataract Extraction:    Detached Retina x 2:   Preventive Screening:  Has patient had any of the following test? Mammography  Pap Smear (1)   Last Mammography: 2011(1)   Last Pap Smear: 2011(1)   Smoking History: Smoking History quit 20 years ago(1)  PFSH: Comments: Grandmother had cancer of the uterus at age 77.  Sister had unspecified malignancy  Comments: Patient used to smoke in the past but has not smoked in the last several years.  He does not drink.  Additional Past Medical and Surgical History: Parkinson's disease since  age of 64   Hypercholesteremia   Polyps of the uterus   Colonic polyps   ADVANCED DIRECTIVES:  Patient does not have any living will or healthcare power of attorney.  Information was given .  Available resources had been discussed.  We will follow-up on subsequent appointments regarding this issue HEALTH MAINTENANCE: Social History  Substance Use Topics  . Smoking status: Former Research scientist (life sciences)  . Smokeless tobacco: None  . Alcohol Use: 0.0 oz/week    0 Standard  drinks or equivalent per week      Allergies  Allergen Reactions  . Codeine Sulfate Nausea And Vomiting    Current Outpatient Prescriptions  Medication Sig Dispense Refill  . carbidopa-levodopa (SINEMET CR) 50-200 MG per tablet   6  . clonazePAM (KLONOPIN) 1 MG tablet     . HYDROcodone-acetaminophen (NORCO/VICODIN) 5-325 MG per tablet Take 1 tablet by mouth every 6 (six) hours as needed. 40 tablet 0    . omeprazole (PRILOSEC) 20 MG capsule Take 20 mg by mouth daily.  3  . pramipexole (MIRAPEX) 1.5 MG tablet Take 1.5 mg by mouth 3 (three) times daily.  5  . Tavaborole (KERYDIN) 5 % SOLN Apply 1 drop topically 1 day or 1 dose. Apply 1 drop to the toenail daily. 1 Bottle 9   No current facility-administered medications for this visit.    OBJECTIVE:  Filed Vitals:   04/08/15 1223  BP: 133/71  Pulse: 84  Temp: 96.6 F (35.9 C)     Body mass index is 26.77 kg/(m^2).    ECOG FS:1 - Symptomatic but completely ambulatory  PHYSICAL EXAM:  GENERAL:  Well developed, well nourished, sitting comfortably in the exam room in no acute distress. MENTAL STATUS:  Alert and oriented to person, place and time.  ENT:  Oropharynx clear without lesion.  Tongue normal. Mucous membranes moist.  RESPIRATORY:  Clear to auscultation without rales, wheezes or rhonchi. CARDIOVASCULAR:  Regular rate and rhythm without murmur, rub or gallop. BREAST:  Right breast without masses, skin changes or nipple discharge.  Left breast without masses, skin changes or nipple discharge. ABDOMEN:  Soft, non-tender, with active bowel sounds, and no hepatosplenomegaly.  No masses. BACK:  No CVA tenderness.  No tenderness on percussion of the back or rib cage. SKIN:  No rashes, ulcers or lesions. EXTREMITIES: No edema, no skin discoloration or tenderness.  No palpable cords. LYMPH NODES: No palpable cervical, supraclavicular, axillary or inguinal adenopathy  NEUROLOGICAL: Unremarkable. PSYCH:  Appropriate.  LAB RESULTS:  CBC Latest Ref Rng 04/08/2015 12/03/2014  WBC 3.6 - 11.0 K/uL 6.1 5.4  Hemoglobin 12.0 - 16.0 g/dL 15.4 15.0  Hematocrit 35.0 - 47.0 % 46.0 45.7  Platelets 150 - 440 K/uL 182 198    Appointment on 04/08/2015  Component Date Value Ref Range Status  . WBC 04/08/2015 6.1  3.6 - 11.0 K/uL Final  . RBC 04/08/2015 5.20  3.80 - 5.20 MIL/uL Final  . Hemoglobin 04/08/2015 15.4  12.0 - 16.0 g/dL Final  . HCT  04/08/2015 46.0  35.0 - 47.0 % Final  . MCV 04/08/2015 88.3  80.0 - 100.0 fL Final  . MCH 04/08/2015 29.6  26.0 - 34.0 pg Final  . MCHC 04/08/2015 33.5  32.0 - 36.0 g/dL Final  . RDW 04/08/2015 13.2  11.5 - 14.5 % Final  . Platelets 04/08/2015 182  150 - 440 K/uL Final  . Neutrophils Relative % 04/08/2015 69   Final  . Neutro Abs 04/08/2015 4.2  1.4 - 6.5 K/uL Final  . Lymphocytes Relative 04/08/2015 20   Final  . Lymphs Abs 04/08/2015 1.2  1.0 - 3.6 K/uL Final  . Monocytes Relative 04/08/2015 8   Final  . Monocytes Absolute 04/08/2015 0.5  0.2 - 0.9 K/uL Final  . Eosinophils Relative 04/08/2015 2   Final  . Eosinophils Absolute 04/08/2015 0.1  0 - 0.7 K/uL Final  . Basophils Relative 04/08/2015 1   Final  . Basophils Absolute 04/08/2015 0.0  0 - 0.1  K/uL Final  . Sodium 04/08/2015 138  135 - 145 mmol/L Final  . Potassium 04/08/2015 4.0  3.5 - 5.1 mmol/L Final  . Chloride 04/08/2015 106  101 - 111 mmol/L Final  . CO2 04/08/2015 28  22 - 32 mmol/L Final  . Glucose, Bld 04/08/2015 115* 65 - 99 mg/dL Final  . BUN 04/08/2015 18  6 - 20 mg/dL Final  . Creatinine, Ser 04/08/2015 0.67  0.44 - 1.00 mg/dL Final  . Calcium 04/08/2015 8.9  8.9 - 10.3 mg/dL Final  . Total Protein 04/08/2015 6.9  6.5 - 8.1 g/dL Final  . Albumin 04/08/2015 4.2  3.5 - 5.0 g/dL Final  . AST 04/08/2015 19  15 - 41 U/L Final  . ALT 04/08/2015 6* 14 - 54 U/L Final  . Alkaline Phosphatase 04/08/2015 97  38 - 126 U/L Final  . Total Bilirubin 04/08/2015 0.5  0.3 - 1.2 mg/dL Final  . GFR calc non Af Amer 04/08/2015 >60  >60 mL/min Final  . GFR calc Af Amer 04/08/2015 >60  >60 mL/min Final   Comment: (NOTE) The eGFR has been calculated using the CKD EPI equation. This calculation has not been validated in all clinical situations. eGFR's persistently <60 mL/min signify possible Chronic Kidney Disease.   . Anion gap 04/08/2015 4* 5 - 15 Final  . LDH 04/08/2015 150  98 - 192 U/L Final     ASSESSMENT: Recurrent  lymphoma stage III status post Rituxan therapy. Last PET scan was in April of 2016 IMPRESSION: 1. No PET-CT findings for recurrent lymphoma. 2. Stable hypermetabolic left thyroid nodule. 3. Stable moderate to large hiatal hernia.   Electronically Signed  By: Marijo Sanes M.D.  On: 11/29/2014 12:51  MEDICAL DECISION MAKING:  All lab data has been reviewed. No evidence of recurrent or progressive disease Return appointment in6 months for reevaluation  Patient expressed understanding and was in agreement with this plan. She also understands that She can call clinic at any time with any questions, concerns, or complaints.    No matching staging information was found for the patient.  Forest Gleason, MD   04/15/2015 4:26 PM

## 2015-04-17 DIAGNOSIS — M9903 Segmental and somatic dysfunction of lumbar region: Secondary | ICD-10-CM | POA: Diagnosis not present

## 2015-04-17 DIAGNOSIS — M5432 Sciatica, left side: Secondary | ICD-10-CM | POA: Diagnosis not present

## 2015-04-17 DIAGNOSIS — M955 Acquired deformity of pelvis: Secondary | ICD-10-CM | POA: Diagnosis not present

## 2015-04-17 DIAGNOSIS — M9905 Segmental and somatic dysfunction of pelvic region: Secondary | ICD-10-CM | POA: Diagnosis not present

## 2015-04-30 ENCOUNTER — Telehealth: Payer: Self-pay | Admitting: *Deleted

## 2015-04-30 NOTE — Telephone Encounter (Signed)
Pt states she injured her surgery toe and needs an appt.

## 2015-05-02 ENCOUNTER — Encounter: Payer: Self-pay | Admitting: Podiatry

## 2015-05-02 ENCOUNTER — Ambulatory Visit (INDEPENDENT_AMBULATORY_CARE_PROVIDER_SITE_OTHER): Payer: Medicare Other | Admitting: Podiatry

## 2015-05-02 ENCOUNTER — Other Ambulatory Visit: Payer: Self-pay | Admitting: Oncology

## 2015-05-02 ENCOUNTER — Ambulatory Visit (INDEPENDENT_AMBULATORY_CARE_PROVIDER_SITE_OTHER): Payer: Medicare Other

## 2015-05-02 DIAGNOSIS — M205X1 Other deformities of toe(s) (acquired), right foot: Secondary | ICD-10-CM

## 2015-05-02 DIAGNOSIS — Z1231 Encounter for screening mammogram for malignant neoplasm of breast: Secondary | ICD-10-CM

## 2015-05-02 NOTE — Patient Instructions (Signed)
Pre-Operative Instructions  Congratulations, you have decided to take an important step to improving your quality of life.  You can be assured that the doctors of [facility] will be with you every step of the way.  1. Plan to be at the surgery center/hospital at least 1 (one) hour prior to your scheduled time unless otherwise directed by the surgical center/hospital staff.  You must have a responsible adult accompany you, remain during the surgery and drive you home.  Make sure you have directions to the surgical center/hospital and know how to get there on time. 2. For hospital based surgery you will need to obtain a history and physical form from your family physician within 1 month prior to the date of surgery- we will give you a form for you primary physician.  3. We make every effort to accommodate the date you request for surgery.  There are however, times where surgery dates or times have to be moved.  We will contact you as soon as possible if a change in schedule is required.   4. No Aspirin/Ibuprofen for one week before surgery.  If you are on aspirin, any non-steroidal anti-inflammatory medications (Mobic, Aleve, Ibuprofen) you should stop taking it 7 days prior to your surgery.  You make take Tylenol  For pain prior to surgery.  5. Medications- If you are taking daily heart and blood pressure medications, seizure, reflux, allergy, asthma, anxiety, pain or diabetes medications, make sure the surgery center/hospital is aware before the day of surgery so they may notify you which medications to take or avoid the day of surgery. 6. No food or drink after midnight the night before surgery unless directed otherwise by surgical center/hospital staff. 7. No alcoholic beverages 24 hours prior to surgery.  No smoking 24 hours prior to or 24 hours after surgery. 8. Wear loose pants or shorts- loose enough to fit over bandages, boots, and casts. 9. No slip on shoes, sneakers are best. 10. Bring  your boot with you to the surgery center/hospital.  Also bring crutches or a walker if your physician has prescribed it for you.  If you do not have this equipment, it will be provided for you after surgery. 11. If you have not been contracted by the surgery center/hospital by the day before your surgery, call to confirm the date and time of your surgery. 12. Leave-time from work may vary depending on the type of surgery you have.  Appropriate arrangements should be made prior to surgery with your employer. 13. Prescriptions will be provided immediately following surgery by your doctor.  Have these filled as soon as possible after surgery and take the medication as directed. 14. Remove nail polish on the operative foot. 15. Wash the night before surgery.  The night before surgery wash the foot and leg well with the antibacterial soap provided and water paying special attention to beneath the toenails and in between the toes.  Rinse thoroughly with water and dry well with a towel.  Perform this wash unless told not to do so by your physician.  Enclosed: 1 Ice pack (please put in freezer the night before surgery)   1 Hibiclens skin cleaner   Pre-op Instructions  If you have any questions regarding the instructions, do not hesitate to call our office.  [place]: [address] [place], [place] [address] [phone]  [place]: [address] [place], [place] [address] [phone]  [place]: [address]  [place], [place] [address] [phone]  Dr. Richard   Tuchman DPM, Dr. Norman Regal DPM Dr. Richard Sikora DPM, Dr. M. Todd Hyatt DPM, Dr. Kathryn Egerton DPM, Dr. Matthew Wagoner DPM 

## 2015-05-02 NOTE — Progress Notes (Signed)
Patient ID: Sarah Donaldson, female   DOB: 1950/10/17, 64 y.o.   MRN: [id number]  Subjective: 64 year old female presents the office today for follow-up evaluation of right first MTPJ pain. She states that she continues have pain over the big toe joint. She recently underwent a cheilectomy and she continues to have pain. She states her big toe joint pops when she tries to bend it although she is unable to bend it. She has pain with turning to bend over and she has pain with wearing shoes and walking. This is in ongoing thing daily. She has tried inserts with a Morton's extension although this has not helped. She states the areas remain somewhat swollen without any redness.  No recent injury or trauma. No other complaints at this time in no acute changes.  Objective: AAO 3, NAD  Neurovascular status intact and unchanged.  There is significant restriction of motion of the right first MTPJ. There is pain with first MTPJ range of motion and there is crepitation present. There is mild edema along the first MTPJ with tenderness to palpation overlying the area. There is no other areas of tenderness to bilateral lower extremities. MMT 5/5, ROM WNL except for otherwise stated. There is no overlying erythema or increase in warmth. There is no open lesions or pre-ulcerative lesions. No calf Compression, swelling, warmth, erythema.   Assessment: 64 year old female with right hallux rigidus   Plan : -X-rays were obtained and reviewed with the patient.  -Treatment options discussed including all alternatives, risks, and complications -Etiology of symptoms were discussed -At this time we discussed further interventions. She will to pursue surgery at this time due to the ongoing pain and discomfort. I discussed with her first MTPJ joint implant versus fusion. At this time after long discussion she has chosen to proceed with a first MTPJ joint resection with implant arthroplasty. The incision placement as well as  the postoperative course was discussed with the patient. I discussed risks of the surgery which include, but not limited to, infection, bleeding, pain, swelling, need for further surgery, delayed or nonhealing, painful or ugly scar, numbness or sensation changes, over/under correction, recurrence, transfer lesions, further deformity, hardware failure, DVT/PE, loss of toe/foot. Patient understands these risks and wishes to proceed with surgery. The surgical consent was reviewed with the patient all 3 pages were signed. No promises or guarantees were given to the outcome of the procedure. All questions were answered to the best of my ability. Before the surgery the patient was encouraged to call the office if there is any further questions. The surgery will be performed at the [facility] on an outpatient basis.  Celesta Gentile, DPM

## 2015-05-03 ENCOUNTER — Telehealth: Payer: Self-pay | Admitting: *Deleted

## 2015-05-03 ENCOUNTER — Other Ambulatory Visit: Payer: Self-pay | Admitting: Oncology

## 2015-05-03 ENCOUNTER — Ambulatory Visit
Admission: RE | Admit: 2015-05-03 | Discharge: 2015-05-03 | Disposition: A | Discharge status: Home / Self Care | Payer: Medicare Other | Source: Ambulatory Visit | Attending: Oncology | Admitting: Oncology

## 2015-05-03 DIAGNOSIS — Z1231 Encounter for screening mammogram for malignant neoplasm of breast: Secondary | ICD-10-CM | POA: Diagnosis not present

## 2015-05-03 DIAGNOSIS — M9905 Segmental and somatic dysfunction of pelvic region: Secondary | ICD-10-CM | POA: Diagnosis not present

## 2015-05-03 DIAGNOSIS — M955 Acquired deformity of pelvis: Secondary | ICD-10-CM | POA: Diagnosis not present

## 2015-05-03 DIAGNOSIS — M5432 Sciatica, left side: Secondary | ICD-10-CM | POA: Diagnosis not present

## 2015-05-03 DIAGNOSIS — M9903 Segmental and somatic dysfunction of lumbar region: Secondary | ICD-10-CM | POA: Diagnosis not present

## 2015-05-03 NOTE — Telephone Encounter (Signed)
"  I was with Dr. Jacqualyn Posey in [facility] yesterday and he told me to schedule an appointment through you for one of the Wednesdays in [place].  I need to set the appointment because he's going to operate on my foot."

## 2015-05-06 NOTE — Telephone Encounter (Signed)
"  I'm calling in regards to getting my wife scheduled for surgery.  Noone has called to schedule her."  I'm waiting on the information form the [place] office.  I spoke with Lattie Haw on Friday.  She said she would bring it today.  His next available date will not be until 05/29/2015.  "That's the earliest.  You will call me when you get the information?"  Yes, I'll call you.

## 2015-05-07 NOTE — Telephone Encounter (Signed)
I got patient's information from [place] office.  We can get her scheduled for 05/29/2015.  Is that date okay.  "You don't have anything earlier?"  No, that is the earliest he has available.  "Okay, we'll take that because she wants it done as soon as possible.  Her foot is killing her."  Surgical center will call with the time a day or two prior to surgery date.  They will let you know exactly what time to get there.  Remind her not to eat or drink anything after midnight.  Have her go ahead and register online with the surgical center or she can call surgical center.  Instructions are in the pamphlet from the surgical center.

## 2015-05-10 DIAGNOSIS — M955 Acquired deformity of pelvis: Secondary | ICD-10-CM | POA: Diagnosis not present

## 2015-05-10 DIAGNOSIS — M9905 Segmental and somatic dysfunction of pelvic region: Secondary | ICD-10-CM | POA: Diagnosis not present

## 2015-05-10 DIAGNOSIS — M9903 Segmental and somatic dysfunction of lumbar region: Secondary | ICD-10-CM | POA: Diagnosis not present

## 2015-05-10 DIAGNOSIS — M5432 Sciatica, left side: Secondary | ICD-10-CM | POA: Diagnosis not present

## 2015-05-15 DIAGNOSIS — M955 Acquired deformity of pelvis: Secondary | ICD-10-CM | POA: Diagnosis not present

## 2015-05-15 DIAGNOSIS — M9905 Segmental and somatic dysfunction of pelvic region: Secondary | ICD-10-CM | POA: Diagnosis not present

## 2015-05-15 DIAGNOSIS — M9903 Segmental and somatic dysfunction of lumbar region: Secondary | ICD-10-CM | POA: Diagnosis not present

## 2015-05-15 DIAGNOSIS — M5432 Sciatica, left side: Secondary | ICD-10-CM | POA: Diagnosis not present

## 2015-05-16 ENCOUNTER — Telehealth: Payer: Self-pay | Admitting: *Deleted

## 2015-05-16 DIAGNOSIS — G4752 REM sleep behavior disorder: Secondary | ICD-10-CM | POA: Diagnosis not present

## 2015-05-16 DIAGNOSIS — M5440 Lumbago with sciatica, unspecified side: Secondary | ICD-10-CM | POA: Diagnosis not present

## 2015-05-16 DIAGNOSIS — G20 Parkinson's disease: Secondary | ICD-10-CM | POA: Diagnosis not present

## 2015-05-16 DIAGNOSIS — R251 Tremor, unspecified: Secondary | ICD-10-CM | POA: Diagnosis not present

## 2015-05-16 DIAGNOSIS — G479 Sleep disorder, unspecified: Secondary | ICD-10-CM | POA: Diagnosis not present

## 2015-05-16 NOTE — Telephone Encounter (Signed)
I called to see if pre-certification is needed for surgery scheduled for 05/29/2015 from [facility].  Seth Bake informed me authorization is not needed because it's patient's secondary insurance.  She has Medicare as her primary insurance.

## 2015-05-21 DIAGNOSIS — M955 Acquired deformity of pelvis: Secondary | ICD-10-CM | POA: Diagnosis not present

## 2015-05-21 DIAGNOSIS — M5432 Sciatica, left side: Secondary | ICD-10-CM | POA: Diagnosis not present

## 2015-05-21 DIAGNOSIS — M9905 Segmental and somatic dysfunction of pelvic region: Secondary | ICD-10-CM | POA: Diagnosis not present

## 2015-05-21 DIAGNOSIS — M9903 Segmental and somatic dysfunction of lumbar region: Secondary | ICD-10-CM | POA: Diagnosis not present

## 2015-05-22 ENCOUNTER — Emergency Department
Admission: EM | Admit: 2015-05-22 | Discharge: 2015-05-22 | Disposition: A | Discharge status: Home / Self Care | Payer: Medicare Other | Attending: Emergency Medicine | Admitting: Emergency Medicine

## 2015-05-22 ENCOUNTER — Emergency Department: Payer: Medicare Other

## 2015-05-22 DIAGNOSIS — M1612 Unilateral primary osteoarthritis, left hip: Secondary | ICD-10-CM | POA: Diagnosis not present

## 2015-05-22 DIAGNOSIS — G20 Parkinson's disease: Secondary | ICD-10-CM | POA: Diagnosis not present

## 2015-05-22 DIAGNOSIS — M1611 Unilateral primary osteoarthritis, right hip: Secondary | ICD-10-CM | POA: Diagnosis not present

## 2015-05-22 DIAGNOSIS — M25551 Pain in right hip: Secondary | ICD-10-CM | POA: Diagnosis not present

## 2015-05-22 DIAGNOSIS — Z87891 Personal history of nicotine dependence: Secondary | ICD-10-CM | POA: Diagnosis not present

## 2015-05-22 DIAGNOSIS — H04123 Dry eye syndrome of bilateral lacrimal glands: Secondary | ICD-10-CM | POA: Diagnosis not present

## 2015-05-22 DIAGNOSIS — E785 Hyperlipidemia, unspecified: Secondary | ICD-10-CM | POA: Diagnosis not present

## 2015-05-22 DIAGNOSIS — Z79899 Other long term (current) drug therapy: Secondary | ICD-10-CM | POA: Insufficient documentation

## 2015-05-22 DIAGNOSIS — N390 Urinary tract infection, site not specified: Secondary | ICD-10-CM | POA: Insufficient documentation

## 2015-05-22 DIAGNOSIS — F419 Anxiety disorder, unspecified: Secondary | ICD-10-CM | POA: Diagnosis not present

## 2015-05-22 DIAGNOSIS — M160 Bilateral primary osteoarthritis of hip: Secondary | ICD-10-CM

## 2015-05-22 DIAGNOSIS — M545 Low back pain: Secondary | ICD-10-CM | POA: Diagnosis present

## 2015-05-22 LAB — URINALYSIS COMPLETE WITH MICROSCOPIC (ARMC ONLY)
Bacteria, UA: NONE SEEN
Bilirubin Urine: NEGATIVE
Glucose, UA: NEGATIVE mg/dL
Hgb urine dipstick: NEGATIVE
Nitrite: NEGATIVE
Protein, ur: NEGATIVE mg/dL
Specific Gravity, Urine: 1.023 (ref 1.005–1.030)
pH: 5 (ref 5.0–8.0)

## 2015-05-22 MED ORDER — CIPROFLOXACIN HCL 500 MG PO TABS
500.0000 mg | ORAL_TABLET | Freq: Once | ORAL | Status: AC
  Administered 2015-05-22: 500 mg via ORAL
  Filled 2015-05-22: qty 1

## 2015-05-22 MED ORDER — KETOROLAC TROMETHAMINE 60 MG/2ML IM SOLN
60.0000 mg | Freq: Once | INTRAMUSCULAR | Status: AC
  Administered 2015-05-22: 60 mg via INTRAMUSCULAR
  Filled 2015-05-22: qty 2

## 2015-05-22 MED ORDER — CIPROFLOXACIN HCL 500 MG PO TABS
500.0000 mg | ORAL_TABLET | Freq: Two times a day (BID) | ORAL | Status: AC
Start: 2015-05-22 — End: 2015-05-25

## 2015-05-22 NOTE — ED Notes (Addendum)
Pt to triage via w/c with no distress noted; pt reports right lower back pain radiating down right leg since April; denies any known injury; denies any accomp symptoms; st pain increases with any weight bearing

## 2015-05-22 NOTE — ED Notes (Signed)
Pt. States no difficulty or pain when urinating.

## 2015-05-22 NOTE — ED Notes (Signed)
Pt. States lower right back pain that radiates down rt. Leg.  Pt. States having same pain for the past 5 months worse tonight.  Pt. States hx of parkinson's.  Pt. Also states surgical hx on rt. Great toe in feb. This year.

## 2015-05-22 NOTE — ED Provider Notes (Signed)
[facility] Emergency Department Provider Note  ____________________________________________  Time seen: Approximately 8:46 PM  I have reviewed the triage vital signs and the nursing notes.   HISTORY  Chief Complaint Back Pain   HPI Sarah Donaldson is a 64 y.o. female patient complain of 6 months of right low back pain radiating to her right leg. He denies any injury. Patient stated pain increases with ambulation and weightbearing. Patient denies any loss of sensation or loss of strength to the lower extremity. Patient is also complaining of increased frequency. No palliative measures taken for this complaint. Patient state manipulation by chiropractor gives a transient relief. Patient rates the pain as a 5/10.   Past Medical History  Diagnosis Date  . Parkinson disease   . Cancer     Non-hodgkin's lymphoma (in remission)    Patient Active Problem List   Diagnosis Date Noted  . Low back pain with sciatica 02/13/2015  . H/O adenomatous polyp of colon 04/06/2014  . Back ache 01/25/2014  . Adiposity 01/25/2014  . REM sleep behavior disorder 01/25/2014  . Disordered sleep 01/25/2014  . Has a tremor 01/25/2014  . Difficulty hearing 06/08/2012  . Anxiety 06/06/2012  . Colon polyp 06/06/2012  . Clinical depression 06/06/2012  . Hypercholesteremia 06/06/2012  . Idiopathic Parkinson's disease ([place]) 06/06/2012  . Detached retina 06/06/2012    Past Surgical History  Procedure Laterality Date  . Retinal detachment surgery    . Bunionectomy      Current Outpatient Rx  Name  Route  Sig  Dispense  Refill  . carbidopa-levodopa (SINEMET IR) 25-100 MG per tablet      TAKE 1/2 OF A TABLET BY MOUTH THREE TIMES A DAY      0   . ciprofloxacin (CIPRO) 500 MG tablet   Oral   Take 1 tablet (500 mg total) by mouth 2 (two) times daily.   6 tablet   0   . clonazePAM (KLONOPIN) 1 MG tablet               . HYDROcodone-acetaminophen (NORCO/VICODIN) 5-325  MG per tablet   Oral   Take 1 tablet by mouth every 6 (six) hours as needed.   40 tablet   0   . omeprazole (PRILOSEC) 20 MG capsule   Oral   Take 20 mg by mouth daily.      3   . pramipexole (MIRAPEX) 1.5 MG tablet   Oral   Take 1.5 mg by mouth 3 (three) times daily.      5   . selegiline (ELDEPRYL) 5 MG capsule      TAKE 1 CAPSULE BY MOUTH WITH BREAKFAST AND LUNCH.      1   . Tavaborole (KERYDIN) 5 % SOLN   Apply externally   Apply 1 drop topically 1 day or 1 dose. Apply 1 drop to the toenail daily.   1 Bottle   9     Allergies Codeine sulfate  No family history on file.  Social History Social History  Substance Use Topics  . Smoking status: Former Research scientist (life sciences)  . Smokeless tobacco: Not on file  . Alcohol Use: 0.0 oz/week    0 Standard drinks or equivalent per week    Review of Systems Constitutional: No fever/chills Eyes: No visual changes. ENT: No sore throat. Cardiovascular: Denies chest pain. Respiratory: Denies shortness of breath. Gastrointestinal: No abdominal pain.  No nausea, no vomiting.  No diarrhea.  No constipation. Genitourinary: Negative for dysuria.  Urinary frequency Musculoskeletal: Back and right hip pain. Skin: Negative for rash. Neurological: Negative for headaches, focal weakness or numbness. Parkinson disease Psychiatric:Anxiety Endocrine:Hyperlipidemia Hematological/Lymphatic: Allergic/Immunilogical: Codeine 10-point ROS otherwise negative.  ____________________________________________   PHYSICAL EXAM:  VITAL SIGNS: ED Triage Vitals  Enc Vitals Group     BP 05/22/15 2009 131/78 mmHg     Pulse Rate 05/22/15 2009 97     Resp 05/22/15 2007 17     Temp 05/22/15 2009 98.2 F (36.8 C)     Temp Source 05/22/15 2009 Oral     SpO2 05/22/15 2009 97 %     Weight 05/22/15 2007 168 lb (76.204 kg)     Height 05/22/15 2007 5' 6.5" (1.689 m)     Head Cir --      Peak Flow --      Pain Score 05/22/15 2008 5     Pain Loc --       Pain Edu? --      Excl. in [place]? --     Constitutional: Alert and oriented. Well appearing and in no acute distress. Eyes: Conjunctivae are normal. PERRL. EOMI. Head: Atraumatic. Nose: No congestion/rhinnorhea. Mouth/Throat: Mucous membranes are moist.  Oropharynx non-erythematous. Neck: No stridor.  No cervical spine tenderness to palpation. Hematological/Lymphatic/Immunilogical: No cervical lymphadenopathy. Cardiovascular: Normal rate, regular rhythm. Grossly normal heart sounds.  Good peripheral circulation. Respiratory: Normal respiratory effort.  No retractions. Lungs CTAB. Gastrointestinal: Soft and nontender. No distention. No abdominal bruits. No CVA tenderness. Musculoskeletal: No lower extremity tenderness nor edema.  No lower extremity length discrepancy. Patient has full and equal passive range of motion. Patient has increased guarding with right hip abduction. Neurologic:  Normal speech and language. No gross focal neurologic deficits are appreciated. No gait instability. Skin:  Skin is warm, dry and intact. No rash noted. Psychiatric: Mood and affect are normal. Speech and behavior are normal.  ____________________________________________   LABS (all labs ordered are listed, but only abnormal results are displayed)  Labs Reviewed  URINALYSIS COMPLETEWITH MICROSCOPIC ([place]) - Abnormal; Notable for the following:    Color, Urine YELLOW (*)    APPearance CLEAR (*)    Ketones, ur TRACE (*)    Leukocytes, UA 3+ (*)    Squamous Epithelial / LPF 0-5 (*)    All other components within normal limits   ____________________________________________  EKG   ____________________________________________  RADIOLOGY  No acute findings x-ray. There is bilateral osteoarthritis. I, Sable Feil, personally viewed and evaluated these images (plain radiographs) as part of my medical decision making.    ____________________________________________   PROCEDURES  Procedure(s) performed: None  Critical Care performed: No  ____________________________________________   INITIAL IMPRESSION / ASSESSMENT AND PLAN / ED COURSE  Pertinent labs & imaging results that were available during my care of the patient were reviewed by me and considered in my medical decision making (see chart for details).  Right hip pain secondary to osteoarthritis. Urinary tract infection. Discussed x-ray and lab results with patient. Patient given a prescription for Cipro and given injection of Toradol in the ER. Patient advised to follow with her family doctor for continued care. FINAL CLINICAL IMPRESSION(S) / ED DIAGNOSES  Final diagnoses:  Primary osteoarthritis of both hips  UTI (lower urinary tract infection)      Sable Feil, PA-C 05/22/15 2202  Orbie Pyo, MD 05/22/15 2314

## 2015-05-23 DIAGNOSIS — N3000 Acute cystitis without hematuria: Secondary | ICD-10-CM | POA: Diagnosis not present

## 2015-05-23 DIAGNOSIS — M545 Low back pain: Secondary | ICD-10-CM | POA: Diagnosis not present

## 2015-05-23 DIAGNOSIS — N3090 Cystitis, unspecified without hematuria: Secondary | ICD-10-CM | POA: Diagnosis not present

## 2015-05-27 DIAGNOSIS — M9905 Segmental and somatic dysfunction of pelvic region: Secondary | ICD-10-CM | POA: Diagnosis not present

## 2015-05-27 DIAGNOSIS — M955 Acquired deformity of pelvis: Secondary | ICD-10-CM | POA: Diagnosis not present

## 2015-05-27 DIAGNOSIS — M5432 Sciatica, left side: Secondary | ICD-10-CM | POA: Diagnosis not present

## 2015-05-27 DIAGNOSIS — M9903 Segmental and somatic dysfunction of lumbar region: Secondary | ICD-10-CM | POA: Diagnosis not present

## 2015-05-28 DIAGNOSIS — N390 Urinary tract infection, site not specified: Secondary | ICD-10-CM | POA: Diagnosis not present

## 2015-05-28 DIAGNOSIS — C8190 Hodgkin lymphoma, unspecified, unspecified site: Secondary | ICD-10-CM | POA: Diagnosis not present

## 2015-05-28 DIAGNOSIS — E785 Hyperlipidemia, unspecified: Secondary | ICD-10-CM | POA: Diagnosis not present

## 2015-05-28 DIAGNOSIS — G20 Parkinson's disease: Secondary | ICD-10-CM | POA: Diagnosis not present

## 2015-05-29 ENCOUNTER — Telehealth: Payer: Self-pay

## 2015-05-29 ENCOUNTER — Telehealth: Payer: Self-pay | Admitting: *Deleted

## 2015-05-29 ENCOUNTER — Encounter: Payer: Self-pay | Admitting: Podiatry

## 2015-05-29 DIAGNOSIS — M2011 Hallux valgus (acquired), right foot: Secondary | ICD-10-CM | POA: Diagnosis not present

## 2015-05-29 DIAGNOSIS — M25571 Pain in right ankle and joints of right foot: Secondary | ICD-10-CM | POA: Diagnosis not present

## 2015-05-29 DIAGNOSIS — M2021 Hallux rigidus, right foot: Secondary | ICD-10-CM | POA: Diagnosis not present

## 2015-05-29 DIAGNOSIS — K219 Gastro-esophageal reflux disease without esophagitis: Secondary | ICD-10-CM | POA: Diagnosis not present

## 2015-05-29 NOTE — Telephone Encounter (Signed)
ERROR

## 2015-05-29 NOTE — Telephone Encounter (Signed)
Pharmacy sent over PA request form: Faxed to Marcy Siren in our [facility] for approval. Lattie Haw

## 2015-05-29 NOTE — Telephone Encounter (Signed)
Pharmacy called wanting to change Vicodin 325mg  to 300mg  dose due to availability, gave verbal order to change

## 2015-06-03 DIAGNOSIS — K210 Gastro-esophageal reflux disease with esophagitis: Secondary | ICD-10-CM | POA: Diagnosis not present

## 2015-06-04 ENCOUNTER — Encounter: Payer: Self-pay | Admitting: Podiatry

## 2015-06-04 ENCOUNTER — Ambulatory Visit (INDEPENDENT_AMBULATORY_CARE_PROVIDER_SITE_OTHER): Payer: Medicare Other | Admitting: Podiatry

## 2015-06-04 ENCOUNTER — Ambulatory Visit (INDEPENDENT_AMBULATORY_CARE_PROVIDER_SITE_OTHER): Payer: Medicare Other

## 2015-06-04 DIAGNOSIS — M205X1 Other deformities of toe(s) (acquired), right foot: Secondary | ICD-10-CM | POA: Diagnosis not present

## 2015-06-04 DIAGNOSIS — Z9889 Other specified postprocedural states: Secondary | ICD-10-CM

## 2015-06-04 NOTE — Progress Notes (Signed)
Patient ID: Sarah Donaldson, female   DOB: 11/17/1950, 64 y.o.   MRN: [id number]  DOS: 05/29/15 s/p right Jake Michaelis arthroplaty with implant  Subjective: Patient presents the office today one-week status post right foot surgery. She states that overall she is doing well. She gets occasional pain she states is more uncomfortable. She states that she gets more pain and she stands on a prolonged period of time. She's been taking ibuprofen for pain and she has not been taking the pain medicine. She is been continuing with antibiotic. She does continue the cam boot. She has been icing and elevating. No recent injury or trauma. No other complete a this time in no acute changes since last upon it. She denies any systemic complaints such as fevers, chills, nausea, vomiting. No calf pain, chest pain, shortness of breath.  Objective: AAO 3, NAD DP/PT pulses palpable 2/4, CRT less than 3 seconds Protective sensation appears to be intact with Derrel Nip monofilament Incisional and dorsal medial aspect of the right foot as well coapted with sutures intact. There is no surrounding erythema, ascending cellulitis, fluctuance, crepitus, malodor, drainage/purulence. There is mild tenderness on palpation to the surgical site. There is no pain with first MTPJ range of motion. There is moderate edema overlying the first MTPJ without any associated erythema her increase in warmth. There is no other areas of tenderness to bilateral lower shoes. No other open lesions or pre-ulcer lesions. No pain with calf compression, swelling, warmth, erythema. There is mild ecchymosis the hallux, second, third digit.  Assessment: 64 year old female 1 week status post right foot surgery, doing well  Plan: -Treatment options discussed including all alternatives, risks, and complications -X-rays were obtained and reviewed with the patient.  -Antibiotic ointtment was placed over  the incision followed by dry sterile dressing. Keep  dressing clean, dry, intact. -Continue with CAM boot. -Ice and elevation. -Pain medications needed. -Can do gentle range of motion exercises for the first MTPJ. -Monitor for any clinical signs or symptoms of infection  Or DVT/PE and directed to call the office immediately should any occur or go to the ER. -Follow-up in 1 week for likely suture removal or sooner if any problems arise. In the meantime, encouraged to call the office with any questions, concerns, change in symptoms.   Celesta Gentile, DPM

## 2015-06-07 DIAGNOSIS — M9905 Segmental and somatic dysfunction of pelvic region: Secondary | ICD-10-CM | POA: Diagnosis not present

## 2015-06-07 DIAGNOSIS — M5432 Sciatica, left side: Secondary | ICD-10-CM | POA: Diagnosis not present

## 2015-06-07 DIAGNOSIS — M9903 Segmental and somatic dysfunction of lumbar region: Secondary | ICD-10-CM | POA: Diagnosis not present

## 2015-06-07 DIAGNOSIS — M955 Acquired deformity of pelvis: Secondary | ICD-10-CM | POA: Diagnosis not present

## 2015-06-11 DIAGNOSIS — G20 Parkinson's disease: Secondary | ICD-10-CM | POA: Diagnosis not present

## 2015-06-11 DIAGNOSIS — C8190 Hodgkin lymphoma, unspecified, unspecified site: Secondary | ICD-10-CM | POA: Diagnosis not present

## 2015-06-11 DIAGNOSIS — M545 Low back pain: Secondary | ICD-10-CM | POA: Diagnosis not present

## 2015-06-13 ENCOUNTER — Encounter: Payer: Self-pay | Admitting: Podiatry

## 2015-06-13 ENCOUNTER — Ambulatory Visit (INDEPENDENT_AMBULATORY_CARE_PROVIDER_SITE_OTHER): Payer: Medicare Other | Admitting: Podiatry

## 2015-06-13 VITALS — BP 121/70 | HR 85 | Resp 18

## 2015-06-13 DIAGNOSIS — Z9889 Other specified postprocedural states: Secondary | ICD-10-CM

## 2015-06-13 DIAGNOSIS — M205X1 Other deformities of toe(s) (acquired), right foot: Secondary | ICD-10-CM

## 2015-06-13 DIAGNOSIS — M9903 Segmental and somatic dysfunction of lumbar region: Secondary | ICD-10-CM | POA: Diagnosis not present

## 2015-06-13 DIAGNOSIS — M5432 Sciatica, left side: Secondary | ICD-10-CM | POA: Diagnosis not present

## 2015-06-13 DIAGNOSIS — M955 Acquired deformity of pelvis: Secondary | ICD-10-CM | POA: Diagnosis not present

## 2015-06-13 DIAGNOSIS — M9905 Segmental and somatic dysfunction of pelvic region: Secondary | ICD-10-CM | POA: Diagnosis not present

## 2015-06-13 NOTE — Progress Notes (Signed)
Patient ID: [name], female   DOB: 11-10-1950, 64 y.o.   MRN: [id number]  DOS: 05/29/15 s/p right Jake Michaelis arthroplaty with implant  Subjective: Patient presents the office today 2-weeks status post right foot surgery. She's she does that she is improving to her foot. She is walking in the boot bleeds it is hurting her back. She did get a bandage wet she did change the bandage because this. She states the swelling has decreased her foot. She denies redness or streaks her incision. Denies any drainage. She's not taking pain medicine. Systemic complaints as fevers, chills, nausea time. No calf pain, chest pain, shortness of breath. No other complaints at this time in no acute changes.  Objective: AAO 3, NAD DP/PT pulses palpable 2/4, CRT less than 3 seconds Protective sensation appears to be intact with Derrel Nip monofilament Incisional and dorsal medial aspect of the right foot as well coapted with sutures intact. There is no surrounding erythema, ascending cellulitis, fluctuance, crepitus, malodor, drainage/purulence. There is mild tenderness on palpation to the surgical site, but improved. There is no pain with first MTPJ range of motion. There is mild, but improved, edema overlying the first MTPJ without any associated erythema her increase in warmth. There is no other areas of tenderness to bilateral lower extremities. No other open lesions or pre-ulcer lesions. No pain with calf compression, swelling, warmth, erythema. There is mild ecchymosis the hallux, second, third digit.  Assessment: 64 year old female 2 weeks status post right foot surgery, doing well  Plan: -Treatment options discussed including all alternatives, risks, and complicationst.  -sutures removed today without complications.Antibiotic ointtment was placed over  the incision followed by dry sterile dressing. Keep dressing clean, dry, intact.sticking with a dressing to monitor the shower as long as the incision  remains closed. -can transition into a Darco shoe. -Ice and elevation. -Pain medications needed. -continue with range of motion activities. -Monitor for any clinical signs or symptoms of infection  Or DVT/PE and directed to call the office immediately should any occur or go to the ER. -Follow-up in 2 weeks or sooner if any problems arise. In the meantime, encouraged to call the office with any questions, concerns, change in symptoms.  *X-ray next appointment.   Celesta Gentile, DPM

## 2015-06-15 DIAGNOSIS — M955 Acquired deformity of pelvis: Secondary | ICD-10-CM | POA: Diagnosis not present

## 2015-06-15 DIAGNOSIS — M9905 Segmental and somatic dysfunction of pelvic region: Secondary | ICD-10-CM | POA: Diagnosis not present

## 2015-06-15 DIAGNOSIS — M5432 Sciatica, left side: Secondary | ICD-10-CM | POA: Diagnosis not present

## 2015-06-15 DIAGNOSIS — M9903 Segmental and somatic dysfunction of lumbar region: Secondary | ICD-10-CM | POA: Diagnosis not present

## 2015-06-18 DIAGNOSIS — M9903 Segmental and somatic dysfunction of lumbar region: Secondary | ICD-10-CM | POA: Diagnosis not present

## 2015-06-18 DIAGNOSIS — M5432 Sciatica, left side: Secondary | ICD-10-CM | POA: Diagnosis not present

## 2015-06-18 DIAGNOSIS — M955 Acquired deformity of pelvis: Secondary | ICD-10-CM | POA: Diagnosis not present

## 2015-06-18 DIAGNOSIS — M9905 Segmental and somatic dysfunction of pelvic region: Secondary | ICD-10-CM | POA: Diagnosis not present

## 2015-06-19 DIAGNOSIS — G4752 REM sleep behavior disorder: Secondary | ICD-10-CM | POA: Diagnosis not present

## 2015-06-19 DIAGNOSIS — R251 Tremor, unspecified: Secondary | ICD-10-CM | POA: Diagnosis not present

## 2015-06-19 DIAGNOSIS — G20 Parkinson's disease: Secondary | ICD-10-CM | POA: Diagnosis not present

## 2015-06-19 DIAGNOSIS — G479 Sleep disorder, unspecified: Secondary | ICD-10-CM | POA: Diagnosis not present

## 2015-06-24 DIAGNOSIS — M9903 Segmental and somatic dysfunction of lumbar region: Secondary | ICD-10-CM | POA: Diagnosis not present

## 2015-06-24 DIAGNOSIS — M9905 Segmental and somatic dysfunction of pelvic region: Secondary | ICD-10-CM | POA: Diagnosis not present

## 2015-06-24 DIAGNOSIS — M955 Acquired deformity of pelvis: Secondary | ICD-10-CM | POA: Diagnosis not present

## 2015-06-24 DIAGNOSIS — M5432 Sciatica, left side: Secondary | ICD-10-CM | POA: Diagnosis not present

## 2015-06-27 ENCOUNTER — Ambulatory Visit (INDEPENDENT_AMBULATORY_CARE_PROVIDER_SITE_OTHER): Payer: Medicare Other | Admitting: Podiatry

## 2015-06-27 ENCOUNTER — Ambulatory Visit (INDEPENDENT_AMBULATORY_CARE_PROVIDER_SITE_OTHER): Payer: Medicare Other

## 2015-06-27 DIAGNOSIS — Z9889 Other specified postprocedural states: Secondary | ICD-10-CM

## 2015-06-27 DIAGNOSIS — M205X1 Other deformities of toe(s) (acquired), right foot: Secondary | ICD-10-CM | POA: Diagnosis not present

## 2015-06-27 DIAGNOSIS — M955 Acquired deformity of pelvis: Secondary | ICD-10-CM | POA: Diagnosis not present

## 2015-06-27 DIAGNOSIS — M9905 Segmental and somatic dysfunction of pelvic region: Secondary | ICD-10-CM | POA: Diagnosis not present

## 2015-06-27 DIAGNOSIS — M5432 Sciatica, left side: Secondary | ICD-10-CM | POA: Diagnosis not present

## 2015-06-27 DIAGNOSIS — M9903 Segmental and somatic dysfunction of lumbar region: Secondary | ICD-10-CM | POA: Diagnosis not present

## 2015-06-27 NOTE — Progress Notes (Signed)
Patient ID: Sarah Donaldson, female   DOB: Jul 12, 1951, 64 y.o.   MRN: [id number]  Subjective: Sarah Donaldson is a 64 y.o. is seen today in office s/p right Sarah Donaldson bunionectomy with implant. She states that her pain is greatly improved. She gets occasional soreness of the toe but no pain. She's not taking any pain medication. She states that one is bothering her today as her back. She is going to the chiropractor for this. Denies any systemic complaints such as fevers, chills, nausea, vomiting. No calf pain, chest pain, shortness of breath.   Objective: General: No acute distress, AAOx3  DP/PT pulses palpable 2/4, CRT < 3 sec to all digits.  otective sensation intact. Motor function intact.  right foot: Incision is well coapted without any evidence of dehiscence and a scar has formed. There is no surrounding erythema, ascending cellulitis, fluctuance, crepitus, malodor, drainage/purulence. There is minimal but improved edema around the surgical site. There is no pain along the surgical site. There is no pivot MPJ range of motion. No other areas of tenderness to bilateral lower extremities.  No other open lesions or pre-ulcerative lesions.  No pain with calf compression, swelling, warmth, erythema.   Assessment and Plan:  Status post right foot surgery, doing well with no complications   -X-rays were obtained and reviewed with the patient. Implant is intact. No evidence of acute fracture or stress fracture. -Treatment options discussed including all alternatives, risks, and complications -Ice/elevation -Compression sock as needed for swelling. -Pain medication as needed. -Cocoa butter or vit E cream over the scar.  -She can start to start to transition back into a regular shoe as tolerated. Discussed with her this is a gradual process. Hold off on exercising at times activities. -Monitor for any clinical signs or symptoms of infection and DVT/PE and directed to call the office immediately  should any occur or go to the ER. -Follow-up in 4 weeks or sooner if any problems arise. In the meantime, encouraged to call the office with any questions, concerns, change in symptoms.   Celesta Gentile, DPM

## 2015-07-08 DIAGNOSIS — M955 Acquired deformity of pelvis: Secondary | ICD-10-CM | POA: Diagnosis not present

## 2015-07-08 DIAGNOSIS — M9905 Segmental and somatic dysfunction of pelvic region: Secondary | ICD-10-CM | POA: Diagnosis not present

## 2015-07-08 DIAGNOSIS — M9903 Segmental and somatic dysfunction of lumbar region: Secondary | ICD-10-CM | POA: Diagnosis not present

## 2015-07-08 DIAGNOSIS — M5432 Sciatica, left side: Secondary | ICD-10-CM | POA: Diagnosis not present

## 2015-07-10 DIAGNOSIS — M955 Acquired deformity of pelvis: Secondary | ICD-10-CM | POA: Diagnosis not present

## 2015-07-10 DIAGNOSIS — M9905 Segmental and somatic dysfunction of pelvic region: Secondary | ICD-10-CM | POA: Diagnosis not present

## 2015-07-10 DIAGNOSIS — M5432 Sciatica, left side: Secondary | ICD-10-CM | POA: Diagnosis not present

## 2015-07-10 DIAGNOSIS — M9903 Segmental and somatic dysfunction of lumbar region: Secondary | ICD-10-CM | POA: Diagnosis not present

## 2015-07-15 DIAGNOSIS — M5432 Sciatica, left side: Secondary | ICD-10-CM | POA: Diagnosis not present

## 2015-07-15 DIAGNOSIS — M9905 Segmental and somatic dysfunction of pelvic region: Secondary | ICD-10-CM | POA: Diagnosis not present

## 2015-07-15 DIAGNOSIS — M955 Acquired deformity of pelvis: Secondary | ICD-10-CM | POA: Diagnosis not present

## 2015-07-15 DIAGNOSIS — M9903 Segmental and somatic dysfunction of lumbar region: Secondary | ICD-10-CM | POA: Diagnosis not present

## 2015-07-22 DIAGNOSIS — M955 Acquired deformity of pelvis: Secondary | ICD-10-CM | POA: Diagnosis not present

## 2015-07-22 DIAGNOSIS — M9903 Segmental and somatic dysfunction of lumbar region: Secondary | ICD-10-CM | POA: Diagnosis not present

## 2015-07-22 DIAGNOSIS — M9905 Segmental and somatic dysfunction of pelvic region: Secondary | ICD-10-CM | POA: Diagnosis not present

## 2015-07-22 DIAGNOSIS — M5432 Sciatica, left side: Secondary | ICD-10-CM | POA: Diagnosis not present

## 2015-07-25 ENCOUNTER — Telehealth: Payer: Self-pay | Admitting: *Deleted

## 2015-07-25 ENCOUNTER — Ambulatory Visit (INDEPENDENT_AMBULATORY_CARE_PROVIDER_SITE_OTHER): Payer: Medicare Other

## 2015-07-25 ENCOUNTER — Ambulatory Visit (INDEPENDENT_AMBULATORY_CARE_PROVIDER_SITE_OTHER): Payer: Medicare Other | Admitting: Podiatry

## 2015-07-25 ENCOUNTER — Encounter: Payer: Self-pay | Admitting: Podiatry

## 2015-07-25 VITALS — BP 127/80 | HR 75 | Resp 18

## 2015-07-25 DIAGNOSIS — M955 Acquired deformity of pelvis: Secondary | ICD-10-CM | POA: Diagnosis not present

## 2015-07-25 DIAGNOSIS — M9903 Segmental and somatic dysfunction of lumbar region: Secondary | ICD-10-CM | POA: Diagnosis not present

## 2015-07-25 DIAGNOSIS — M205X1 Other deformities of toe(s) (acquired), right foot: Secondary | ICD-10-CM | POA: Diagnosis not present

## 2015-07-25 DIAGNOSIS — R52 Pain, unspecified: Secondary | ICD-10-CM

## 2015-07-25 DIAGNOSIS — Z9889 Other specified postprocedural states: Secondary | ICD-10-CM

## 2015-07-25 DIAGNOSIS — M5432 Sciatica, left side: Secondary | ICD-10-CM | POA: Diagnosis not present

## 2015-07-25 DIAGNOSIS — M9905 Segmental and somatic dysfunction of pelvic region: Secondary | ICD-10-CM | POA: Diagnosis not present

## 2015-07-25 NOTE — Progress Notes (Signed)
Patient ID: Sarah Donaldson, female   DOB: [birth year], 64 y.o.   MRN: [id number]  Subjective: Sarah Donaldson is a 64 y.o. is seen today in office s/p right Jake Michaelis bunionectomy with implant. She states that her pain is continuing to improve. She gets occasional soreness of the toe but no pain. She can now bend the toe without pain. She does get some intermittent swelling but no warmth or redness. No drainage from the incision. She's not taking any pain medication. She states her back continues to bother her. She is going to the chiropractor for this. Denies any systemic complaints such as fevers, chills, nausea, vomiting. No calf pain, chest pain, shortness of breath.   Objective: General: No acute distress, AAOx3  DP/PT pulses palpable 2/4, CRT < 3 sec to all digits.  otective sensation intact. Motor function intact.  Right foot: Incision is well coapted without any evidence of dehiscence and a scar has formed. There is no surrounding erythema, ascending cellulitis, fluctuance, crepitus, malodor, drainage/purulence. There is minimal edema around the surgical site. There is no pain along the surgical site at this time. There is no pain with MPJ range of motion. No other areas of tenderness to bilateral lower extremities.  No other open lesions or pre-ulcerative lesions.  No pain with calf compression, swelling, warmth, erythema.   Assessment and Plan:  Status post right foot surgery, doing well with no complications   -X-rays were obtained and reviewed with the patient. Implant is intact. No evidence of acute fracture or stress fracture. -Treatment options discussed including all alternatives, risks, and complications -Ice/elevation -Compression sock as needed for swelling. -Pain medication as needed. -Cocoa butter or vit E cream over the scar.  -Continue with regular shoe gear. She is are to increase her activities. -Monitor for any clinical signs or symptoms of infection and DVT/PE and  directed to call the office immediately should any occur or go to the ER. -Follow-up as scheduled or sooner if any problems arise. In the meantime, encouraged to call the office with any questions, concerns, change in symptoms.   Celesta Gentile, DPM

## 2015-07-25 NOTE — Telephone Encounter (Addendum)
-----   Message from Trula Slade, DPM sent at 07/25/2015 11:43 AM EST ----- Can you put a referral for [facility] in [place] for her? She is having a lot of back pain.  Referral and pt data faxed

## 2015-07-27 DIAGNOSIS — M205X9 Other deformities of toe(s) (acquired), unspecified foot: Secondary | ICD-10-CM | POA: Insufficient documentation

## 2015-07-30 DIAGNOSIS — M9905 Segmental and somatic dysfunction of pelvic region: Secondary | ICD-10-CM | POA: Diagnosis not present

## 2015-07-30 DIAGNOSIS — M955 Acquired deformity of pelvis: Secondary | ICD-10-CM | POA: Diagnosis not present

## 2015-07-30 DIAGNOSIS — M9903 Segmental and somatic dysfunction of lumbar region: Secondary | ICD-10-CM | POA: Diagnosis not present

## 2015-07-30 DIAGNOSIS — M5432 Sciatica, left side: Secondary | ICD-10-CM | POA: Diagnosis not present

## 2015-08-02 DIAGNOSIS — M9905 Segmental and somatic dysfunction of pelvic region: Secondary | ICD-10-CM | POA: Diagnosis not present

## 2015-08-02 DIAGNOSIS — M955 Acquired deformity of pelvis: Secondary | ICD-10-CM | POA: Diagnosis not present

## 2015-08-02 DIAGNOSIS — M9903 Segmental and somatic dysfunction of lumbar region: Secondary | ICD-10-CM | POA: Diagnosis not present

## 2015-08-02 DIAGNOSIS — M5432 Sciatica, left side: Secondary | ICD-10-CM | POA: Diagnosis not present

## 2015-08-06 DIAGNOSIS — M9903 Segmental and somatic dysfunction of lumbar region: Secondary | ICD-10-CM | POA: Diagnosis not present

## 2015-08-06 DIAGNOSIS — M955 Acquired deformity of pelvis: Secondary | ICD-10-CM | POA: Diagnosis not present

## 2015-08-06 DIAGNOSIS — M5432 Sciatica, left side: Secondary | ICD-10-CM | POA: Diagnosis not present

## 2015-08-06 DIAGNOSIS — M9905 Segmental and somatic dysfunction of pelvic region: Secondary | ICD-10-CM | POA: Diagnosis not present

## 2015-08-13 DIAGNOSIS — R5381 Other malaise: Secondary | ICD-10-CM | POA: Diagnosis not present

## 2015-08-13 DIAGNOSIS — M549 Dorsalgia, unspecified: Secondary | ICD-10-CM | POA: Diagnosis not present

## 2015-08-13 DIAGNOSIS — M955 Acquired deformity of pelvis: Secondary | ICD-10-CM | POA: Diagnosis not present

## 2015-08-13 DIAGNOSIS — E034 Atrophy of thyroid (acquired): Secondary | ICD-10-CM | POA: Diagnosis not present

## 2015-08-13 DIAGNOSIS — M9905 Segmental and somatic dysfunction of pelvic region: Secondary | ICD-10-CM | POA: Diagnosis not present

## 2015-08-13 DIAGNOSIS — E784 Other hyperlipidemia: Secondary | ICD-10-CM | POA: Diagnosis not present

## 2015-08-13 DIAGNOSIS — M9903 Segmental and somatic dysfunction of lumbar region: Secondary | ICD-10-CM | POA: Diagnosis not present

## 2015-08-13 DIAGNOSIS — M5440 Lumbago with sciatica, unspecified side: Secondary | ICD-10-CM | POA: Diagnosis not present

## 2015-08-13 DIAGNOSIS — I10 Essential (primary) hypertension: Secondary | ICD-10-CM | POA: Diagnosis not present

## 2015-08-13 DIAGNOSIS — M5432 Sciatica, left side: Secondary | ICD-10-CM | POA: Diagnosis not present

## 2015-08-20 DIAGNOSIS — M9903 Segmental and somatic dysfunction of lumbar region: Secondary | ICD-10-CM | POA: Diagnosis not present

## 2015-08-20 DIAGNOSIS — M5432 Sciatica, left side: Secondary | ICD-10-CM | POA: Diagnosis not present

## 2015-08-20 DIAGNOSIS — M9905 Segmental and somatic dysfunction of pelvic region: Secondary | ICD-10-CM | POA: Diagnosis not present

## 2015-08-20 DIAGNOSIS — M955 Acquired deformity of pelvis: Secondary | ICD-10-CM | POA: Diagnosis not present

## 2015-08-21 DIAGNOSIS — G4752 REM sleep behavior disorder: Secondary | ICD-10-CM | POA: Diagnosis not present

## 2015-08-21 DIAGNOSIS — M5440 Lumbago with sciatica, unspecified side: Secondary | ICD-10-CM | POA: Diagnosis not present

## 2015-08-21 DIAGNOSIS — G479 Sleep disorder, unspecified: Secondary | ICD-10-CM | POA: Diagnosis not present

## 2015-08-21 DIAGNOSIS — G20 Parkinson's disease: Secondary | ICD-10-CM | POA: Diagnosis not present

## 2015-08-21 DIAGNOSIS — R251 Tremor, unspecified: Secondary | ICD-10-CM | POA: Diagnosis not present

## 2015-08-26 DIAGNOSIS — M955 Acquired deformity of pelvis: Secondary | ICD-10-CM | POA: Diagnosis not present

## 2015-08-26 DIAGNOSIS — H6122 Impacted cerumen, left ear: Secondary | ICD-10-CM | POA: Diagnosis not present

## 2015-08-26 DIAGNOSIS — C8590 Non-Hodgkin lymphoma, unspecified, unspecified site: Secondary | ICD-10-CM | POA: Diagnosis not present

## 2015-08-26 DIAGNOSIS — M9905 Segmental and somatic dysfunction of pelvic region: Secondary | ICD-10-CM | POA: Diagnosis not present

## 2015-08-26 DIAGNOSIS — M5432 Sciatica, left side: Secondary | ICD-10-CM | POA: Diagnosis not present

## 2015-08-26 DIAGNOSIS — M9903 Segmental and somatic dysfunction of lumbar region: Secondary | ICD-10-CM | POA: Diagnosis not present

## 2015-08-28 DIAGNOSIS — H35372 Puckering of macula, left eye: Secondary | ICD-10-CM | POA: Diagnosis not present

## 2015-08-28 DIAGNOSIS — H209 Unspecified iridocyclitis: Secondary | ICD-10-CM | POA: Diagnosis not present

## 2015-09-03 DIAGNOSIS — M9903 Segmental and somatic dysfunction of lumbar region: Secondary | ICD-10-CM | POA: Diagnosis not present

## 2015-09-03 DIAGNOSIS — M955 Acquired deformity of pelvis: Secondary | ICD-10-CM | POA: Diagnosis not present

## 2015-09-03 DIAGNOSIS — M9905 Segmental and somatic dysfunction of pelvic region: Secondary | ICD-10-CM | POA: Diagnosis not present

## 2015-09-03 DIAGNOSIS — M5432 Sciatica, left side: Secondary | ICD-10-CM | POA: Diagnosis not present

## 2015-09-19 DIAGNOSIS — M47817 Spondylosis without myelopathy or radiculopathy, lumbosacral region: Secondary | ICD-10-CM | POA: Diagnosis not present

## 2015-09-19 DIAGNOSIS — M25551 Pain in right hip: Secondary | ICD-10-CM | POA: Diagnosis not present

## 2015-09-19 DIAGNOSIS — M545 Low back pain: Secondary | ICD-10-CM | POA: Diagnosis not present

## 2015-09-24 DIAGNOSIS — K219 Gastro-esophageal reflux disease without esophagitis: Secondary | ICD-10-CM | POA: Diagnosis not present

## 2015-09-24 DIAGNOSIS — F419 Anxiety disorder, unspecified: Secondary | ICD-10-CM | POA: Diagnosis not present

## 2015-09-24 DIAGNOSIS — G20 Parkinson's disease: Secondary | ICD-10-CM | POA: Diagnosis not present

## 2015-09-24 DIAGNOSIS — G4730 Sleep apnea, unspecified: Secondary | ICD-10-CM | POA: Diagnosis not present

## 2015-09-25 DIAGNOSIS — M25551 Pain in right hip: Secondary | ICD-10-CM | POA: Diagnosis not present

## 2015-09-26 ENCOUNTER — Ambulatory Visit (INDEPENDENT_AMBULATORY_CARE_PROVIDER_SITE_OTHER): Payer: Medicare Other

## 2015-09-26 ENCOUNTER — Encounter: Payer: Self-pay | Admitting: Podiatry

## 2015-09-26 ENCOUNTER — Ambulatory Visit (INDEPENDENT_AMBULATORY_CARE_PROVIDER_SITE_OTHER): Payer: Medicare Other | Admitting: Podiatry

## 2015-09-26 DIAGNOSIS — M205X1 Other deformities of toe(s) (acquired), right foot: Secondary | ICD-10-CM | POA: Diagnosis not present

## 2015-09-26 DIAGNOSIS — Z9889 Other specified postprocedural states: Secondary | ICD-10-CM

## 2015-09-27 NOTE — Progress Notes (Signed)
Patient ID: Sarah Donaldson, female   DOB: 09-Apr-1951, 65 y.o.   MRN: [id number]  Subjective: Sarah Donaldson is a 65 y.o. is seen today in office s/p right Jake Michaelis bunionectomy with implant. Since last appointment she states that she is not have any pain in the surgical site and she can move her toe with minimal pain. She states that his been a great improvement compared to prior to surgery. She is not taking any pain medicine. She says the swelling is also decreased. She's been able to wear regular shoe. Denies any systemic complaints such as fevers, chills, nausea, vomiting. No calf pain, chest pain, shortness of breath.   Objective: General: No acute distress, AAOx3  DP/PT pulses palpable 2/4, CRT < 3 sec to all digits.  otective sensation intact. Motor function intact.  Right foot: Incision is well coapted without any evidence of dehiscence and a scar has well formed. There is no surrounding erythema, ascending cellulitis, fluctuance, crepitus, malodor, drainage/purulence. There is greatly improved edema around the surgical site. There is no pain along the surgical site at this time. There is no pain with MPJ range of motion. No other areas of tenderness to bilateral lower extremities.  No other open lesions or pre-ulcerative lesions.  No pain with calf compression, swelling, warmth, erythema.   Assessment and Plan:  Status post right foot surgery, doing well with no complications   -X-rays were obtained and reviewed with the patient. Implant is intact. -Treatment options discussed including all alternatives, risks, and complications -Ice/elevation -Compression sock as needed for swelling. -Cocoa butter or vit E cream over the scar.  -Continue with regular shoe gear. She is are to increase her activities. -Monitor for any clinical signs or symptoms of infection and DVT/PE and directed to call the office immediately should any occur or go to the ER. -Follow-up as needed. In the meantime,  encouraged to call the office with any questions, concerns, change in symptoms.   Celesta Gentile, DPM

## 2015-09-30 DIAGNOSIS — M5432 Sciatica, left side: Secondary | ICD-10-CM | POA: Diagnosis not present

## 2015-09-30 DIAGNOSIS — M955 Acquired deformity of pelvis: Secondary | ICD-10-CM | POA: Diagnosis not present

## 2015-09-30 DIAGNOSIS — M9905 Segmental and somatic dysfunction of pelvic region: Secondary | ICD-10-CM | POA: Diagnosis not present

## 2015-09-30 DIAGNOSIS — M9903 Segmental and somatic dysfunction of lumbar region: Secondary | ICD-10-CM | POA: Diagnosis not present

## 2015-10-08 ENCOUNTER — Inpatient Hospital Stay: Payer: Medicare Other

## 2015-10-08 ENCOUNTER — Inpatient Hospital Stay: Payer: Medicare Other | Attending: Oncology | Admitting: Oncology

## 2015-10-08 ENCOUNTER — Encounter: Payer: Self-pay | Admitting: Oncology

## 2015-10-08 VITALS — BP 118/75 | HR 89 | Temp 96.4°F | Resp 18 | Wt 164.5 lb

## 2015-10-08 DIAGNOSIS — E7800 Pure hypercholesterolemia, unspecified: Secondary | ICD-10-CM | POA: Diagnosis not present

## 2015-10-08 DIAGNOSIS — M545 Low back pain: Secondary | ICD-10-CM | POA: Diagnosis not present

## 2015-10-08 DIAGNOSIS — C8211 Follicular lymphoma grade II, lymph nodes of head, face, and neck: Secondary | ICD-10-CM | POA: Diagnosis not present

## 2015-10-08 DIAGNOSIS — Z79899 Other long term (current) drug therapy: Secondary | ICD-10-CM | POA: Diagnosis not present

## 2015-10-08 DIAGNOSIS — C8590 Non-Hodgkin lymphoma, unspecified, unspecified site: Secondary | ICD-10-CM

## 2015-10-08 DIAGNOSIS — G620 Drug-induced polyneuropathy: Secondary | ICD-10-CM | POA: Diagnosis not present

## 2015-10-08 DIAGNOSIS — Z9221 Personal history of antineoplastic chemotherapy: Secondary | ICD-10-CM | POA: Insufficient documentation

## 2015-10-08 DIAGNOSIS — G20 Parkinson's disease: Secondary | ICD-10-CM | POA: Diagnosis not present

## 2015-10-08 DIAGNOSIS — M1990 Unspecified osteoarthritis, unspecified site: Secondary | ICD-10-CM | POA: Diagnosis not present

## 2015-10-08 DIAGNOSIS — Z86010 Personal history of colonic polyps: Secondary | ICD-10-CM | POA: Insufficient documentation

## 2015-10-08 DIAGNOSIS — Z87891 Personal history of nicotine dependence: Secondary | ICD-10-CM | POA: Insufficient documentation

## 2015-10-08 LAB — CBC WITH DIFFERENTIAL/PLATELET
Basophils Absolute: 0 10*3/uL (ref 0–0.1)
Basophils Relative: 1 %
Eosinophils Absolute: 0.2 10*3/uL (ref 0–0.7)
Eosinophils Relative: 2 %
HCT: 44.2 % (ref 35.0–47.0)
Hemoglobin: 14.7 g/dL (ref 12.0–16.0)
Lymphocytes Relative: 18 %
Lymphs Abs: 1.3 10*3/uL (ref 1.0–3.6)
MCH: 29.2 pg (ref 26.0–34.0)
MCHC: 33.1 g/dL (ref 32.0–36.0)
MCV: 88 fL (ref 80.0–100.0)
Monocytes Absolute: 0.6 10*3/uL (ref 0.2–0.9)
Monocytes Relative: 8 %
Neutro Abs: 5 10*3/uL (ref 1.4–6.5)
Neutrophils Relative %: 71 %
Platelets: 174 10*3/uL (ref 150–440)
RBC: 5.02 MIL/uL (ref 3.80–5.20)
RDW: 12.8 % (ref 11.5–14.5)
WBC: 7.1 10*3/uL (ref 3.6–11.0)

## 2015-10-08 LAB — COMPREHENSIVE METABOLIC PANEL
ALT: <5 U/L — ABNORMAL LOW (ref 14–54)
AST: 16 U/L (ref 15–41)
Albumin: 3.9 g/dL (ref 3.5–5.0)
Alkaline Phosphatase: 89 U/L (ref 38–126)
Anion gap: 4 — ABNORMAL LOW (ref 5–15)
BUN: 22 mg/dL — ABNORMAL HIGH (ref 6–20)
CO2: 27 mmol/L (ref 22–32)
Calcium: 8.9 mg/dL (ref 8.9–10.3)
Chloride: 105 mmol/L (ref 101–111)
Creatinine, Ser: 0.68 mg/dL (ref 0.44–1.00)
GFR calc Af Amer: >60 mL/min (ref >60)
GFR calc non Af Amer: >60 mL/min (ref >60)
Glucose, Bld: 109 mg/dL — ABNORMAL HIGH (ref 65–99)
Potassium: 3.5 mmol/L (ref 3.5–5.1)
Sodium: 136 mmol/L (ref 135–145)
Total Bilirubin: 0.8 mg/dL (ref 0.3–1.2)
Total Protein: 6.6 g/dL (ref 6.5–8.1)

## 2015-10-08 LAB — LACTATE DEHYDROGENASE: LDH: 160 U/L (ref 98–192)

## 2015-10-08 NOTE — Progress Notes (Signed)
[place] @ [facility] [phone]  [phone]     Sarah Donaldson OB: May 18, 1951  MR#: [id number]  YQM#:[id number]  Patient Care Team: Cletis Athens, MD as PCP - General (Cardiology)  CHIEF COMPLAINT:  Chief Complaint/Diagnosis:   1. Superficial Protidectomy (right side) December 17/2013.  Consistent with low-grade CD10 and [id number] to follicular lymphoma grade 1-2.  Is positive for CD19 and cough of light chain.bone marrow aspiration and biopsy is negative .  PET scan was positive for some increased uptake in the spleen Stage IVA disease PET scan is consistent with bilateral neck lymph node (December, 2013) 2. Weekly rituxan has been started.  August 24, 2012 3. Patient finished the last dose of Rituxan on September 14 2012 4.on maintenance rituximab since April of 2014 patient has finished rituximabin December of 2015      INTERVAL HISTORY: 65 year old lady came today further follow-up regarding follicular lymphoma.  Status post Rituxan therapy. Patient had multiple other symptoms including arthritis.  No chills.  No fever.  No weight loss. This and continues to a problem with feet as well as low back pain.  Patient had multiple surgery done by podiatrist. No chills.  No fever.  Patient is here for ongoing evaluation and treatment consideration  REVIEW OF SYSTEMS:   GENERAL:  Feels good.  Active.  No fevers, sweats or weight loss. PERFORMANCE STATUS (ECOG):  01 HEENT:  No visual changes, runny nose, sore throat, mouth sores or tenderness. Lungs: No shortness of breath or cough.  No hemoptysis. Cardiac:  No chest pain, palpitations, orthopnea, or PND. GI:  No nausea, vomiting, diarrhea, constipation, melena or hematochezia. GU:  No urgency, frequency, dysuria, or hematuria. Musculoskeletal:  No back pain.  No joint pain.  No muscle tenderness. Extremities:  No pain or swelling. Skin:  No rashes or skin changes. Neuro:  No headache, numbness or weakness,  balance or coordination issues. Endocrine:  No diabetes, thyroid issues, hot flashes or night sweats. Psych:  No mood changes, depression or anxiety. Pain:  No focal pain. Review of systems:  All other systems reviewed and found to be negative. As per HPI. Otherwise, a complete review of systems is negatve.  PAST MEDICAL HISTORY: Past Medical History  Diagnosis Date  . Parkinson disease ([place])   . Cancer ([place])     Non-hodgkin's lymphoma (in remission)    PAST SURGICAL HISTORY: Past Surgical History  Procedure Laterality Date  . Retinal detachment surgery    . Bunionectomy       Significant History/PMH:   Back Pain, Chronic:    Hard of Hearing/ Bilateral hearing aids:    Septoplasty:    Cataract Extraction:    Detached Retina x 2:   Preventive Screening:  Has patient had any of the following test? Mammography  Pap Smear (1)   Last Mammography: 2011(1)   Last Pap Smear: 2011(1)   Smoking History: Smoking History quit 20 years ago(1)  PFSH: Comments: Grandmother had cancer of the uterus at age 37.  Sister had unspecified malignancy  Comments: Patient used to smoke in the past but has not smoked in the last several years.  He does not drink.  Additional Past Medical and Surgical History: Parkinson's disease since  age of 43   Hypercholesteremia   Polyps of the uterus   Colonic polyps   ADVANCED DIRECTIVES:  Patient does not have any living will or healthcare power of attorney.  Information was given .  Available resources had been discussed.  We will follow-up on subsequent appointments regarding this issue HEALTH MAINTENANCE: Social History  Substance Use Topics  . Smoking status: Former Research scientist (life sciences)  . Smokeless tobacco: None  . Alcohol Use: 0.0 oz/week    0 Standard drinks or equivalent per week      Allergies  Allergen Reactions  . Codeine Sulfate Nausea And Vomiting    Current Outpatient Prescriptions  Medication Sig Dispense Refill  .  carbidopa-levodopa (SINEMET IR) 25-100 MG per tablet TAKE 1/2 OF A TABLET BY MOUTH THREE TIMES A DAY  0  . clonazePAM (KLONOPIN) 1 MG tablet     . gabapentin (NEURONTIN) 100 MG capsule Take by mouth.    Marland Kitchen HYDROcodone-acetaminophen (NORCO/VICODIN) 5-325 MG per tablet Take 1 tablet by mouth every 6 (six) hours as needed. 40 tablet 0  . omeprazole (PRILOSEC) 20 MG capsule Take 20 mg by mouth daily.  3  . pramipexole (MIRAPEX) 1.5 MG tablet Take 1.5 mg by mouth 3 (three) times daily.  5  . rosuvastatin (CRESTOR) 10 MG tablet     . selegiline (ELDEPRYL) 5 MG capsule TAKE 1 CAPSULE BY MOUTH WITH BREAKFAST AND LUNCH.  1  . Tavaborole (KERYDIN) 5 % SOLN Apply 1 drop topically 1 day or 1 dose. Apply 1 drop to the toenail daily. 1 Bottle 9  . furosemide (LASIX) 40 MG tablet Take by mouth.    . meloxicam (MOBIC) 7.5 MG tablet Take by mouth.    . traZODone (DESYREL) 50 MG tablet TAKE 1-2 TABLETS NIGHTLY AS NEEDED FOR SLEEP.  1   No current facility-administered medications for this visit.    OBJECTIVE:  Filed Vitals:   10/08/15 1146  BP: 118/75  Pulse: 89  Temp: 96.4 F (35.8 C)  Resp: 18     Body mass index is 26.15 kg/(m^2).    ECOG FS:1 - Symptomatic but completely ambulatory  PHYSICAL EXAM:  GENERAL:  Well developed, well nourished, sitting comfortably in the exam room in no acute distress. MENTAL STATUS:  Alert and oriented to person, place and time.  ENT:  Oropharynx clear without lesion.  Tongue normal. Mucous membranes moist.  RESPIRATORY:  Clear to auscultation without rales, wheezes or rhonchi. CARDIOVASCULAR:  Regular rate and rhythm without murmur, rub or gallop. BREAST:  Right breast without masses, skin changes or nipple discharge.  Left breast without masses, skin changes or nipple discharge. ABDOMEN:  Soft, non-tender, with active bowel sounds, and no hepatosplenomegaly.  No masses. BACK:  No CVA tenderness.  No tenderness on percussion of the back or rib cage. SKIN:  No  rashes, ulcers or lesions. EXTREMITIES: No edema, no skin discoloration or tenderness.  No palpable cords. LYMPH NODES: No palpable cervical, supraclavicular, axillary or inguinal adenopathy  NEUROLOGICAL: Unremarkable. PSYCH:  Appropriate.  LAB RESULTS:  CBC Latest Ref Rng 10/08/2015 04/08/2015  WBC 3.6 - 11.0 K/uL 7.1 6.1  Hemoglobin 12.0 - 16.0 g/dL 14.7 15.4  Hematocrit 35.0 - 47.0 % 44.2 46.0  Platelets 150 - 440 K/uL 174 182    Appointment on 10/08/2015  Component Date Value Ref Range Status  . WBC 10/08/2015 7.1  3.6 - 11.0 K/uL Final  . RBC 10/08/2015 5.02  3.80 - 5.20 MIL/uL Final  . Hemoglobin 10/08/2015 14.7  12.0 - 16.0 g/dL Final  . HCT 10/08/2015 44.2  35.0 - 47.0 % Final  . MCV 10/08/2015 88.0  80.0 - 100.0 fL Final  . MCH 10/08/2015 29.2  26.0 - 34.0 pg Final  . MCHC 10/08/2015 33.1  32.0 - 36.0 g/dL Final  . RDW 10/08/2015 12.8  11.5 - 14.5 % Final  . Platelets 10/08/2015 174  150 - 440 K/uL Final  . Neutrophils Relative % 10/08/2015 71   Final  . Neutro Abs 10/08/2015 5.0  1.4 - 6.5 K/uL Final  . Lymphocytes Relative 10/08/2015 18   Final  . Lymphs Abs 10/08/2015 1.3  1.0 - 3.6 K/uL Final  . Monocytes Relative 10/08/2015 8   Final  . Monocytes Absolute 10/08/2015 0.6  0.2 - 0.9 K/uL Final  . Eosinophils Relative 10/08/2015 2   Final  . Eosinophils Absolute 10/08/2015 0.2  0 - 0.7 K/uL Final  . Basophils Relative 10/08/2015 1   Final  . Basophils Absolute 10/08/2015 0.0  0 - 0.1 K/uL Final  . Sodium 10/08/2015 136  135 - 145 mmol/L Final  . Potassium 10/08/2015 3.5  3.5 - 5.1 mmol/L Final  . Chloride 10/08/2015 105  101 - 111 mmol/L Final  . CO2 10/08/2015 27  22 - 32 mmol/L Final  . Glucose, Bld 10/08/2015 109* 65 - 99 mg/dL Final  . BUN 10/08/2015 22* 6 - 20 mg/dL Final  . Creatinine, Ser 10/08/2015 0.68  0.44 - 1.00 mg/dL Final  . Calcium 10/08/2015 8.9  8.9 - 10.3 mg/dL Final  . Total Protein 10/08/2015 6.6  6.5 - 8.1 g/dL Final  . Albumin 10/08/2015  3.9  3.5 - 5.0 g/dL Final  . AST 10/08/2015 16  15 - 41 U/L Final  . ALT 10/08/2015 <5* 14 - 54 U/L Final  . Alkaline Phosphatase 10/08/2015 89  38 - 126 U/L Final  . Total Bilirubin 10/08/2015 0.8  0.3 - 1.2 mg/dL Final  . GFR calc non Af Amer 10/08/2015 >60  >60 mL/min Final  . GFR calc Af Amer 10/08/2015 >60  >60 mL/min Final   Comment: (NOTE) The eGFR has been calculated using the CKD EPI equation. This calculation has not been validated in all clinical situations. eGFR's persistently <60 mL/min signify possible Chronic Kidney Disease.   . Anion gap 10/08/2015 4* 5 - 15 Final  . LDH 10/08/2015 160  98 - 192 U/L Final     ASSESSMENT: Recurrent lymphoma stage III status post Rituxan therapy. All lab data has been reviewed they are within acceptable range. Patient is getting regular mammograms done by primary care physician. We will repeat another PET scan in next few weeks and will be evaluated.  If PET scan shows complete resolution off lymphoma patient will be followed every 6 month.  Anticipating my retirement patient would be followed by either Dr. B  Or  Dr. Susy Manor Patient was advised to call me after PET scan is done to discuss the results on the phone. If there is any problem further planning of treatment would be done by me. Patient understands to call if there is any significant problem in next six-month        Patient expressed understanding and was in agreement with this plan. She also understands that She can call clinic at any time with any questions, concerns, or complaints.    No matching staging information was found for the patient.  Forest Gleason, MD   10/08/2015 12:18 PM

## 2015-10-15 ENCOUNTER — Telehealth: Payer: Self-pay | Admitting: *Deleted

## 2015-10-15 NOTE — Telephone Encounter (Signed)
Dr. Aquilla Solian assistant states pt says she had a toe replacement by Dr. Jacqualyn Posey.  I informed the assistant pt had a right Keller bunion implant in 05/2015, and if < than 6 months would need pre-medications.

## 2015-10-22 DIAGNOSIS — M47817 Spondylosis without myelopathy or radiculopathy, lumbosacral region: Secondary | ICD-10-CM | POA: Diagnosis not present

## 2015-10-22 DIAGNOSIS — M545 Low back pain: Secondary | ICD-10-CM | POA: Diagnosis not present

## 2015-10-22 DIAGNOSIS — M5137 Other intervertebral disc degeneration, lumbosacral region: Secondary | ICD-10-CM | POA: Diagnosis not present

## 2015-10-23 ENCOUNTER — Ambulatory Visit
Admission: RE | Admit: 2015-10-23 | Discharge: 2015-10-23 | Disposition: A | Discharge status: Home / Self Care | Payer: Medicare Other | Source: Ambulatory Visit | Attending: Oncology | Admitting: Oncology

## 2015-10-23 DIAGNOSIS — C8590 Non-Hodgkin lymphoma, unspecified, unspecified site: Secondary | ICD-10-CM | POA: Insufficient documentation

## 2015-10-23 DIAGNOSIS — R935 Abnormal findings on diagnostic imaging of other abdominal regions, including retroperitoneum: Secondary | ICD-10-CM | POA: Diagnosis not present

## 2015-10-23 DIAGNOSIS — E041 Nontoxic single thyroid nodule: Secondary | ICD-10-CM | POA: Diagnosis not present

## 2015-10-23 LAB — GLUCOSE, CAPILLARY: Glucose-Capillary: 87 mg/dL (ref 65–99)

## 2015-10-23 MED ORDER — FLUDEOXYGLUCOSE F - 18 (FDG) INJECTION: 12.7400 | Freq: Once | INTRAVENOUS | Status: DC | PRN

## 2015-10-28 DIAGNOSIS — M545 Low back pain: Secondary | ICD-10-CM | POA: Diagnosis not present

## 2015-10-28 DIAGNOSIS — M47817 Spondylosis without myelopathy or radiculopathy, lumbosacral region: Secondary | ICD-10-CM | POA: Diagnosis not present

## 2015-10-28 DIAGNOSIS — M5137 Other intervertebral disc degeneration, lumbosacral region: Secondary | ICD-10-CM | POA: Diagnosis not present

## 2015-11-07 DIAGNOSIS — H43811 Vitreous degeneration, right eye: Secondary | ICD-10-CM | POA: Diagnosis not present

## 2015-11-07 DIAGNOSIS — H35372 Puckering of macula, left eye: Secondary | ICD-10-CM | POA: Diagnosis not present

## 2015-11-11 ENCOUNTER — Telehealth: Payer: Self-pay | Admitting: *Deleted

## 2015-11-11 DIAGNOSIS — G4752 REM sleep behavior disorder: Secondary | ICD-10-CM | POA: Diagnosis not present

## 2015-11-11 DIAGNOSIS — E663 Overweight: Secondary | ICD-10-CM | POA: Diagnosis not present

## 2015-11-11 DIAGNOSIS — G4733 Obstructive sleep apnea (adult) (pediatric): Secondary | ICD-10-CM | POA: Diagnosis not present

## 2015-11-11 NOTE — Telephone Encounter (Signed)
Pt requests PET scan results. PET scan was normal. Results given to pt's spouse, Jenny Reichmann. [name] verbalized understanding.

## 2015-11-12 DIAGNOSIS — M5137 Other intervertebral disc degeneration, lumbosacral region: Secondary | ICD-10-CM | POA: Diagnosis not present

## 2015-11-12 DIAGNOSIS — M545 Low back pain: Secondary | ICD-10-CM | POA: Diagnosis not present

## 2015-11-12 DIAGNOSIS — M47817 Spondylosis without myelopathy or radiculopathy, lumbosacral region: Secondary | ICD-10-CM | POA: Diagnosis not present

## 2015-11-25 DIAGNOSIS — G479 Sleep disorder, unspecified: Secondary | ICD-10-CM | POA: Diagnosis not present

## 2015-11-25 DIAGNOSIS — M5440 Lumbago with sciatica, unspecified side: Secondary | ICD-10-CM | POA: Diagnosis not present

## 2015-11-25 DIAGNOSIS — M47817 Spondylosis without myelopathy or radiculopathy, lumbosacral region: Secondary | ICD-10-CM | POA: Diagnosis not present

## 2015-11-25 DIAGNOSIS — R251 Tremor, unspecified: Secondary | ICD-10-CM | POA: Diagnosis not present

## 2015-11-25 DIAGNOSIS — M545 Low back pain: Secondary | ICD-10-CM | POA: Diagnosis not present

## 2015-11-25 DIAGNOSIS — G20 Parkinson's disease: Secondary | ICD-10-CM | POA: Diagnosis not present

## 2015-11-25 DIAGNOSIS — G4752 REM sleep behavior disorder: Secondary | ICD-10-CM | POA: Diagnosis not present

## 2015-11-27 DIAGNOSIS — G4730 Sleep apnea, unspecified: Secondary | ICD-10-CM | POA: Diagnosis not present

## 2015-12-02 DIAGNOSIS — C8190 Hodgkin lymphoma, unspecified, unspecified site: Secondary | ICD-10-CM | POA: Diagnosis not present

## 2015-12-02 DIAGNOSIS — J039 Acute tonsillitis, unspecified: Secondary | ICD-10-CM | POA: Diagnosis not present

## 2015-12-16 DIAGNOSIS — J029 Acute pharyngitis, unspecified: Secondary | ICD-10-CM | POA: Diagnosis not present

## 2015-12-16 DIAGNOSIS — F329 Major depressive disorder, single episode, unspecified: Secondary | ICD-10-CM | POA: Diagnosis not present

## 2015-12-16 DIAGNOSIS — F419 Anxiety disorder, unspecified: Secondary | ICD-10-CM | POA: Diagnosis not present

## 2015-12-16 DIAGNOSIS — C8190 Hodgkin lymphoma, unspecified, unspecified site: Secondary | ICD-10-CM | POA: Diagnosis not present

## 2015-12-19 DIAGNOSIS — M25551 Pain in right hip: Secondary | ICD-10-CM | POA: Diagnosis not present

## 2016-01-11 DIAGNOSIS — M9903 Segmental and somatic dysfunction of lumbar region: Secondary | ICD-10-CM | POA: Diagnosis not present

## 2016-01-11 DIAGNOSIS — M955 Acquired deformity of pelvis: Secondary | ICD-10-CM | POA: Diagnosis not present

## 2016-01-11 DIAGNOSIS — M9905 Segmental and somatic dysfunction of pelvic region: Secondary | ICD-10-CM | POA: Diagnosis not present

## 2016-01-11 DIAGNOSIS — M5432 Sciatica, left side: Secondary | ICD-10-CM | POA: Diagnosis not present

## 2016-01-17 DIAGNOSIS — M9903 Segmental and somatic dysfunction of lumbar region: Secondary | ICD-10-CM | POA: Diagnosis not present

## 2016-01-17 DIAGNOSIS — M955 Acquired deformity of pelvis: Secondary | ICD-10-CM | POA: Diagnosis not present

## 2016-01-17 DIAGNOSIS — M9905 Segmental and somatic dysfunction of pelvic region: Secondary | ICD-10-CM | POA: Diagnosis not present

## 2016-01-17 DIAGNOSIS — M5432 Sciatica, left side: Secondary | ICD-10-CM | POA: Diagnosis not present

## 2016-01-21 DIAGNOSIS — M9905 Segmental and somatic dysfunction of pelvic region: Secondary | ICD-10-CM | POA: Diagnosis not present

## 2016-01-21 DIAGNOSIS — M9903 Segmental and somatic dysfunction of lumbar region: Secondary | ICD-10-CM | POA: Diagnosis not present

## 2016-01-21 DIAGNOSIS — M5432 Sciatica, left side: Secondary | ICD-10-CM | POA: Diagnosis not present

## 2016-01-21 DIAGNOSIS — M955 Acquired deformity of pelvis: Secondary | ICD-10-CM | POA: Diagnosis not present

## 2016-01-22 DIAGNOSIS — S39012A Strain of muscle, fascia and tendon of lower back, initial encounter: Secondary | ICD-10-CM | POA: Diagnosis not present

## 2016-01-22 DIAGNOSIS — M545 Low back pain: Secondary | ICD-10-CM | POA: Diagnosis not present

## 2016-02-17 DIAGNOSIS — K219 Gastro-esophageal reflux disease without esophagitis: Secondary | ICD-10-CM | POA: Diagnosis not present

## 2016-02-17 DIAGNOSIS — F419 Anxiety disorder, unspecified: Secondary | ICD-10-CM | POA: Diagnosis not present

## 2016-02-17 DIAGNOSIS — G20 Parkinson's disease: Secondary | ICD-10-CM | POA: Diagnosis not present

## 2016-02-17 DIAGNOSIS — F329 Major depressive disorder, single episode, unspecified: Secondary | ICD-10-CM | POA: Diagnosis not present

## 2016-02-19 DIAGNOSIS — M47817 Spondylosis without myelopathy or radiculopathy, lumbosacral region: Secondary | ICD-10-CM | POA: Diagnosis not present

## 2016-02-19 DIAGNOSIS — M5416 Radiculopathy, lumbar region: Secondary | ICD-10-CM | POA: Diagnosis not present

## 2016-02-19 DIAGNOSIS — M545 Low back pain: Secondary | ICD-10-CM | POA: Diagnosis not present

## 2016-02-24 DIAGNOSIS — M26629 Arthralgia of temporomandibular joint, unspecified side: Secondary | ICD-10-CM | POA: Diagnosis not present

## 2016-02-24 DIAGNOSIS — Z8572 Personal history of non-Hodgkin lymphomas: Secondary | ICD-10-CM | POA: Diagnosis not present

## 2016-02-24 DIAGNOSIS — R42 Dizziness and giddiness: Secondary | ICD-10-CM | POA: Diagnosis not present

## 2016-03-04 DIAGNOSIS — M5416 Radiculopathy, lumbar region: Secondary | ICD-10-CM | POA: Diagnosis not present

## 2016-03-04 DIAGNOSIS — M545 Low back pain: Secondary | ICD-10-CM | POA: Diagnosis not present

## 2016-03-04 DIAGNOSIS — M47817 Spondylosis without myelopathy or radiculopathy, lumbosacral region: Secondary | ICD-10-CM | POA: Diagnosis not present

## 2016-03-17 DIAGNOSIS — G5791 Unspecified mononeuropathy of right lower limb: Secondary | ICD-10-CM | POA: Diagnosis not present

## 2016-03-17 DIAGNOSIS — G20 Parkinson's disease: Secondary | ICD-10-CM | POA: Diagnosis not present

## 2016-03-17 DIAGNOSIS — C8190 Hodgkin lymphoma, unspecified, unspecified site: Secondary | ICD-10-CM | POA: Diagnosis not present

## 2016-03-17 DIAGNOSIS — E785 Hyperlipidemia, unspecified: Secondary | ICD-10-CM | POA: Diagnosis not present

## 2016-03-19 DIAGNOSIS — L821 Other seborrheic keratosis: Secondary | ICD-10-CM | POA: Diagnosis not present

## 2016-03-19 DIAGNOSIS — M5432 Sciatica, left side: Secondary | ICD-10-CM | POA: Diagnosis not present

## 2016-03-19 DIAGNOSIS — M9905 Segmental and somatic dysfunction of pelvic region: Secondary | ICD-10-CM | POA: Diagnosis not present

## 2016-03-19 DIAGNOSIS — M955 Acquired deformity of pelvis: Secondary | ICD-10-CM | POA: Diagnosis not present

## 2016-03-19 DIAGNOSIS — L578 Other skin changes due to chronic exposure to nonionizing radiation: Secondary | ICD-10-CM | POA: Diagnosis not present

## 2016-03-19 DIAGNOSIS — M9903 Segmental and somatic dysfunction of lumbar region: Secondary | ICD-10-CM | POA: Diagnosis not present

## 2016-03-19 DIAGNOSIS — L565 Disseminated superficial actinic porokeratosis (DSAP): Secondary | ICD-10-CM | POA: Diagnosis not present

## 2016-03-23 ENCOUNTER — Other Ambulatory Visit: Payer: Self-pay | Admitting: Internal Medicine

## 2016-03-23 ENCOUNTER — Other Ambulatory Visit: Payer: Self-pay | Admitting: Cardiology

## 2016-03-23 DIAGNOSIS — M5432 Sciatica, left side: Secondary | ICD-10-CM | POA: Diagnosis not present

## 2016-03-23 DIAGNOSIS — M9905 Segmental and somatic dysfunction of pelvic region: Secondary | ICD-10-CM | POA: Diagnosis not present

## 2016-03-23 DIAGNOSIS — Z1231 Encounter for screening mammogram for malignant neoplasm of breast: Secondary | ICD-10-CM

## 2016-03-23 DIAGNOSIS — M9903 Segmental and somatic dysfunction of lumbar region: Secondary | ICD-10-CM | POA: Diagnosis not present

## 2016-03-23 DIAGNOSIS — M955 Acquired deformity of pelvis: Secondary | ICD-10-CM | POA: Diagnosis not present

## 2016-03-26 ENCOUNTER — Ambulatory Visit
Admission: RE | Admit: 2016-03-26 | Discharge: 2016-03-26 | Disposition: A | Discharge status: Home / Self Care | Payer: Medicare Other | Source: Ambulatory Visit | Attending: Internal Medicine | Admitting: Internal Medicine

## 2016-03-26 ENCOUNTER — Other Ambulatory Visit: Payer: Self-pay | Admitting: Internal Medicine

## 2016-03-26 DIAGNOSIS — G20 Parkinson's disease: Secondary | ICD-10-CM | POA: Diagnosis not present

## 2016-03-26 DIAGNOSIS — M5431 Sciatica, right side: Secondary | ICD-10-CM

## 2016-03-26 DIAGNOSIS — M5432 Sciatica, left side: Secondary | ICD-10-CM | POA: Diagnosis not present

## 2016-03-26 DIAGNOSIS — M9903 Segmental and somatic dysfunction of lumbar region: Secondary | ICD-10-CM | POA: Diagnosis not present

## 2016-03-26 DIAGNOSIS — M5441 Lumbago with sciatica, right side: Secondary | ICD-10-CM | POA: Insufficient documentation

## 2016-03-26 DIAGNOSIS — G5791 Unspecified mononeuropathy of right lower limb: Secondary | ICD-10-CM | POA: Diagnosis not present

## 2016-03-26 DIAGNOSIS — M5135 Other intervertebral disc degeneration, thoracolumbar region: Secondary | ICD-10-CM | POA: Insufficient documentation

## 2016-03-26 DIAGNOSIS — E785 Hyperlipidemia, unspecified: Secondary | ICD-10-CM | POA: Diagnosis not present

## 2016-03-26 DIAGNOSIS — K219 Gastro-esophageal reflux disease without esophagitis: Secondary | ICD-10-CM | POA: Diagnosis not present

## 2016-03-26 DIAGNOSIS — M9905 Segmental and somatic dysfunction of pelvic region: Secondary | ICD-10-CM | POA: Diagnosis not present

## 2016-03-26 DIAGNOSIS — M5136 Other intervertebral disc degeneration, lumbar region: Secondary | ICD-10-CM | POA: Diagnosis not present

## 2016-03-26 DIAGNOSIS — M955 Acquired deformity of pelvis: Secondary | ICD-10-CM | POA: Diagnosis not present

## 2016-04-06 ENCOUNTER — Ambulatory Visit: Payer: Medicare Other | Admitting: Internal Medicine

## 2016-04-06 ENCOUNTER — Other Ambulatory Visit: Payer: Medicare Other

## 2016-04-08 DIAGNOSIS — G5791 Unspecified mononeuropathy of right lower limb: Secondary | ICD-10-CM | POA: Diagnosis not present

## 2016-04-08 DIAGNOSIS — K219 Gastro-esophageal reflux disease without esophagitis: Secondary | ICD-10-CM | POA: Diagnosis not present

## 2016-04-08 DIAGNOSIS — C8190 Hodgkin lymphoma, unspecified, unspecified site: Secondary | ICD-10-CM | POA: Diagnosis not present

## 2016-04-08 DIAGNOSIS — Z23 Encounter for immunization: Secondary | ICD-10-CM | POA: Diagnosis not present

## 2016-04-16 NOTE — Progress Notes (Signed)
DOS 10.19.2016 Right foot first metatarsophalangeal joint (big toe joint) removal with implant

## 2016-04-22 ENCOUNTER — Ambulatory Visit: Payer: Medicare Other | Admitting: Internal Medicine

## 2016-04-22 ENCOUNTER — Other Ambulatory Visit: Payer: Medicare Other

## 2016-04-27 DIAGNOSIS — G20 Parkinson's disease: Secondary | ICD-10-CM | POA: Diagnosis not present

## 2016-04-27 DIAGNOSIS — G4752 REM sleep behavior disorder: Secondary | ICD-10-CM | POA: Diagnosis not present

## 2016-04-27 DIAGNOSIS — M5440 Lumbago with sciatica, unspecified side: Secondary | ICD-10-CM | POA: Diagnosis not present

## 2016-04-27 DIAGNOSIS — G479 Sleep disorder, unspecified: Secondary | ICD-10-CM | POA: Diagnosis not present

## 2016-04-27 DIAGNOSIS — R251 Tremor, unspecified: Secondary | ICD-10-CM | POA: Diagnosis not present

## 2016-04-29 ENCOUNTER — Other Ambulatory Visit: Payer: Self-pay | Admitting: Neurology

## 2016-04-29 DIAGNOSIS — M5440 Lumbago with sciatica, unspecified side: Secondary | ICD-10-CM

## 2016-04-30 ENCOUNTER — Other Ambulatory Visit: Payer: Self-pay

## 2016-04-30 DIAGNOSIS — C8590 Non-Hodgkin lymphoma, unspecified, unspecified site: Secondary | ICD-10-CM

## 2016-05-01 ENCOUNTER — Inpatient Hospital Stay: Payer: Medicare Other

## 2016-05-01 ENCOUNTER — Encounter: Payer: Self-pay | Admitting: Internal Medicine

## 2016-05-01 ENCOUNTER — Inpatient Hospital Stay: Payer: Medicare Other | Attending: Internal Medicine | Admitting: Internal Medicine

## 2016-05-01 DIAGNOSIS — G629 Polyneuropathy, unspecified: Secondary | ICD-10-CM | POA: Diagnosis not present

## 2016-05-01 DIAGNOSIS — G20 Parkinson's disease: Secondary | ICD-10-CM | POA: Diagnosis not present

## 2016-05-01 DIAGNOSIS — Z79899 Other long term (current) drug therapy: Secondary | ICD-10-CM

## 2016-05-01 DIAGNOSIS — C8203 Follicular lymphoma grade I, intra-abdominal lymph nodes: Secondary | ICD-10-CM | POA: Insufficient documentation

## 2016-05-01 DIAGNOSIS — Z87891 Personal history of nicotine dependence: Secondary | ICD-10-CM | POA: Insufficient documentation

## 2016-05-01 DIAGNOSIS — C8590 Non-Hodgkin lymphoma, unspecified, unspecified site: Secondary | ICD-10-CM

## 2016-05-01 DIAGNOSIS — C8299 Follicular lymphoma, unspecified, extranodal and solid organ sites: Secondary | ICD-10-CM | POA: Insufficient documentation

## 2016-05-01 DIAGNOSIS — M545 Low back pain: Secondary | ICD-10-CM | POA: Diagnosis not present

## 2016-05-01 LAB — CBC WITH DIFFERENTIAL/PLATELET
Basophils Absolute: 0 10*3/uL (ref 0–0.1)
Basophils Relative: 0 %
Eosinophils Absolute: 0.1 10*3/uL (ref 0–0.7)
Eosinophils Relative: 3 %
HCT: 41.2 % (ref 35.0–47.0)
Hemoglobin: 13.9 g/dL (ref 12.0–16.0)
Lymphocytes Relative: 27 %
Lymphs Abs: 1.4 10*3/uL (ref 1.0–3.6)
MCH: 29.7 pg (ref 26.0–34.0)
MCHC: 33.8 g/dL (ref 32.0–36.0)
MCV: 88 fL (ref 80.0–100.0)
Monocytes Absolute: 0.4 10*3/uL (ref 0.2–0.9)
Monocytes Relative: 9 %
Neutro Abs: 3.2 10*3/uL (ref 1.4–6.5)
Neutrophils Relative %: 61 %
Platelets: 190 10*3/uL (ref 150–440)
RBC: 4.69 MIL/uL (ref 3.80–5.20)
RDW: 13.9 % (ref 11.5–14.5)
WBC: 5.3 10*3/uL (ref 3.6–11.0)

## 2016-05-01 LAB — COMPREHENSIVE METABOLIC PANEL
ALT: 6 U/L — ABNORMAL LOW (ref 14–54)
AST: 19 U/L (ref 15–41)
Albumin: 4 g/dL (ref 3.5–5.0)
Alkaline Phosphatase: 86 U/L (ref 38–126)
Anion gap: 6 (ref 5–15)
BUN: 23 mg/dL — ABNORMAL HIGH (ref 6–20)
CO2: 25 mmol/L (ref 22–32)
Calcium: 8.9 mg/dL (ref 8.9–10.3)
Chloride: 109 mmol/L (ref 101–111)
Creatinine, Ser: 0.49 mg/dL (ref 0.44–1.00)
GFR calc Af Amer: >60 mL/min (ref >60)
GFR calc non Af Amer: >60 mL/min (ref >60)
Glucose, Bld: 100 mg/dL — ABNORMAL HIGH (ref 65–99)
Potassium: 4.1 mmol/L (ref 3.5–5.1)
Sodium: 140 mmol/L (ref 135–145)
Total Bilirubin: 0.3 mg/dL (ref 0.3–1.2)
Total Protein: 6.6 g/dL (ref 6.5–8.1)

## 2016-05-01 LAB — LACTATE DEHYDROGENASE: LDH: 164 U/L (ref 98–192)

## 2016-05-01 NOTE — Progress Notes (Signed)
[place] OFFICE PROGRESS NOTE  Patient Care Team: Cletis Athens, MD as PCP - General (Cardiology)  No matching staging information was found for the patient.   Oncology History   1. Superficial Protidectomy (right side) December 17/2013.  Consistent with low-grade CD10 and [id number] to follicular lymphoma grade 1-2.  Is positive for CD19 and cough of light chain.bone marrow aspiration and biopsy is negative .  PET scan was positive for some increased uptake in the spleen Stage IVA disease PET scan is consistent with bilateral neck lymph node (December, 2013) 2. Weekly rituxan has been started.  August 24, 2012 3. Patient finished the last dose of Rituxan on September 14 2012 4.on maintenance rituximab since April of 2014 patient has finished rituximabin December of 2015  # PET MARCH 2017 ? Splenic recurrence; CT images stable.   # chronic back pain/PN/ Parkinsons [Dr.Potter]     Follicular lymphoma of extranodal and solid organ sites [facility])     This is my first interaction with the patient as patient's primary oncologist has been Dr.Choksi. I reviewed the patient's prior charts/pertinent labs/imaging in detail; findings are summarized above.     INTERVAL HISTORY:  Sarah Donaldson 65 y.o.  female pleasant patient above history of Low-grade non-Hodgkin's lymphoma is here for follow-up. Patient has not on any treatment since December 2015.  Patient denies any weight loss. Denies any night sweats. Denies any low-grade fevers.  Patient complains of chronic low back pain on the right side radiating to the leg; with neuropathy. Question sciatica. Has an MRI pending.   REVIEW OF SYSTEMS:  A complete 10 point review of system is done which is negative except mentioned above/history of present illness.   PAST MEDICAL HISTORY :  Past Medical History:  Diagnosis Date  . Cancer ([place])    Non-hodgkin's lymphoma (in remission)  . Parkinson disease ([place])     PAST  SURGICAL HISTORY :   Past Surgical History:  Procedure Laterality Date  . BUNIONECTOMY    . RETINAL DETACHMENT SURGERY      FAMILY HISTORY :  History reviewed. No pertinent family history.  SOCIAL HISTORY:   Social History  Substance Use Topics  . Smoking status: Former Research scientist (life sciences)  . Smokeless tobacco: Never Used  . Alcohol use 0.0 oz/week    ALLERGIES:  is allergic to codeine sulfate.  MEDICATIONS:  Current Outpatient Prescriptions  Medication Sig Dispense Refill  . carbidopa-levodopa (SINEMET IR) 25-100 MG per tablet TAKE 1/2 OF A TABLET BY MOUTH THREE TIMES A DAY  0  . clonazePAM (KLONOPIN) 1 MG tablet     . furosemide (LASIX) 40 MG tablet Take by mouth.    . meloxicam (MOBIC) 7.5 MG tablet Take by mouth.    Marland Kitchen omeprazole (PRILOSEC) 20 MG capsule Take 20 mg by mouth daily.  3  . pramipexole (MIRAPEX) 1.5 MG tablet Take 1.5 mg by mouth 3 (three) times daily.  5  . rosuvastatin (CRESTOR) 10 MG tablet     . selegiline (ELDEPRYL) 5 MG capsule TAKE 1 CAPSULE BY MOUTH WITH BREAKFAST AND LUNCH.  1  . Tavaborole (KERYDIN) 5 % SOLN Apply 1 drop topically 1 day or 1 dose. Apply 1 drop to the toenail daily. 1 Bottle 9  . traZODone (DESYREL) 50 MG tablet TAKE 1-2 TABLETS NIGHTLY AS NEEDED FOR SLEEP.  1  . HYDROcodone-acetaminophen (NORCO/VICODIN) 5-325 MG per tablet Take 1 tablet by mouth every 6 (six) hours as needed. (Patient not taking: Reported on  05/01/2016) 40 tablet 0  . HYDROcodone-acetaminophen (NORCO/VICODIN) 5-325 MG tablet Take 1-2 tablets by mouth every 6 (six) hours as needed for moderate pain (take 1-2 tablets every four to six hours as needed for pain).    . promethazine (PHENERGAN) 12.5 MG tablet Take 12.5 mg by mouth every 6 (six) hours as needed for nausea or vomiting (take 1 tablet by mouth every 6 hours as needed for nausea and vomiting).     No current facility-administered medications for this visit.     PHYSICAL EXAMINATION: ECOG PERFORMANCE STATUS: 0 -  Asymptomatic  Temp (!) 96.6 F (35.9 C) (Tympanic)   Ht 5' 6.5" (1.689 m)   Wt 167 lb 12.8 oz (76.1 kg)   BMI 26.68 kg/m   Filed Weights   05/01/16 1145  Weight: 167 lb 12.8 oz (76.1 kg)    GENERAL: Well-nourished well-developed; Alert, no distress and comfortable.   With her husband.  EYES: no pallor or icterus OROPHARYNX: no thrush or ulceration; good dentition  NECK: supple, no masses felt LYMPH:  no palpable lymphadenopathy in the cervical, axillary or inguinal regions LUNGS: clear to auscultation and  No wheeze or crackles HEART/CVS: regular rate & rhythm and no murmurs; No lower extremity edema ABDOMEN:abdomen soft, non-tender and normal bowel sounds Musculoskeletal:no cyanosis of digits and no clubbing  PSYCH: alert & oriented x 3 with fluent speech NEURO: no focal motor/sensory deficits SKIN:  no rashes or significant lesions  LABORATORY DATA:  I have reviewed the data as listed    Component Value Date/Time   NA 140 05/01/2016 1127   NA 139 12/03/2014 1038   K 4.1 05/01/2016 1127   K 3.6 12/03/2014 1038   CL 109 05/01/2016 1127   CL 106 12/03/2014 1038   CO2 25 05/01/2016 1127   CO2 28 12/03/2014 1038   GLUCOSE 100 (H) 05/01/2016 1127   GLUCOSE 94 12/03/2014 1038   BUN 23 (H) 05/01/2016 1127   BUN 21 (H) 12/03/2014 1038   CREATININE 0.49 05/01/2016 1127   CREATININE 0.65 12/03/2014 1038   CALCIUM 8.9 05/01/2016 1127   CALCIUM 9.1 12/03/2014 1038   PROT 6.6 05/01/2016 1127   PROT 6.9 12/03/2014 1038   ALBUMIN 4.0 05/01/2016 1127   ALBUMIN 4.2 12/03/2014 1038   AST 19 05/01/2016 1127   AST 19 12/03/2014 1038   ALT 6 (L) 05/01/2016 1127   ALT < 5 (L) 12/03/2014 1038   ALKPHOS 86 05/01/2016 1127   ALKPHOS 89 12/03/2014 1038   BILITOT 0.3 05/01/2016 1127   BILITOT 0.8 12/03/2014 1038   GFRNONAA >60 05/01/2016 1127   GFRNONAA >60 12/03/2014 1038   GFRAA >60 05/01/2016 1127   GFRAA >60 12/03/2014 1038    No results found for: SPEP, UPEP  Lab Results   Component Value Date   WBC 5.3 05/01/2016   NEUTROABS 3.2 05/01/2016   HGB 13.9 05/01/2016   HCT 41.2 05/01/2016   MCV 88.0 05/01/2016   PLT 190 05/01/2016      Chemistry      Component Value Date/Time   NA 140 05/01/2016 1127   NA 139 12/03/2014 1038   K 4.1 05/01/2016 1127   K 3.6 12/03/2014 1038   CL 109 05/01/2016 1127   CL 106 12/03/2014 1038   CO2 25 05/01/2016 1127   CO2 28 12/03/2014 1038   BUN 23 (H) 05/01/2016 1127   BUN 21 (H) 12/03/2014 1038   CREATININE 0.49 05/01/2016 1127   CREATININE 0.65 12/03/2014 1038  Component Value Date/Time   CALCIUM 8.9 05/01/2016 1127   CALCIUM 9.1 12/03/2014 1038   ALKPHOS 86 05/01/2016 1127   ALKPHOS 89 12/03/2014 1038   AST 19 05/01/2016 1127   AST 19 12/03/2014 1038   ALT 6 (L) 05/01/2016 1127   ALT < 5 (L) 12/03/2014 1038   BILITOT 0.3 05/01/2016 1127   BILITOT 0.8 12/03/2014 1038       RADIOGRAPHIC STUDIES: I have personally reviewed the radiological images as listed and agreed with the findings in the report. No results found.   ASSESSMENT & PLAN:  Follicular lymphoma of extranodal and solid organ sites [facility]) # Low-grade follicular lymphoma; involving the spleen. Rituxan maintenance last December 2015. PET scan 2017 February- question activity in the spleen. Recommend a repeat PET scan  # Chronic pain right hip low back question sciatica. Patient's MRI pending.  # CBC CMP and LDH normal.  # Patient follow-up with me. Days after the results of the PET scan to review.   # I reviewed the blood work- with the patient in detail; also reviewed the imaging independently [as summarized above]; and with the patient in detail.   # 25 minutes face-to-face with the patient discussing the above plan of care; more than 50% of time spent on prognosis/ natural history; counseling and coordination.   Orders Placed This Encounter  Procedures  . NM PET Image Restag (PS) Skull Base To Thigh    Standing Status:   Future     Standing Expiration Date:   05/01/2017    Order Specific Question:   Reason for Exam (SYMPTOM  OR DIAGNOSIS REQUIRED)    Answer:   Subsequent treatment strategy for lymphoma    Order Specific Question:   Preferred imaging location?    Answer:   [facility]    Order Specific Question:   If indicated for the ordered procedure, I authorize the administration of a radiopharmaceutical per Radiology protocol    Answer:   Yes   All questions were answered. The patient knows to call the clinic with any problems, questions or concerns.      Cammie Sickle, MD 05/01/2016 1:42 PM

## 2016-05-01 NOTE — Assessment & Plan Note (Addendum)
#   Low-grade follicular lymphoma; involving the spleen. Rituxan maintenance last December 2015. PET scan 2017 February- question activity in the spleen. Recommend a repeat PET scan  # Chronic pain right hip low back question sciatica. Patient's MRI pending.  # CBC CMP and LDH normal.  # Patient follow-up with me. Days after the results of the PET scan to review.   # I reviewed the blood work- with the patient in detail; also reviewed the imaging independently [as summarized above]; and with the patient in detail.   # 25 minutes face-to-face with the patient discussing the above plan of care; more than 50% of time spent on prognosis/ natural history; counseling and coordination.

## 2016-05-01 NOTE — Progress Notes (Signed)
Pt complains today about a lump on right upper leg and thinks that a place sticks out on right lower back.

## 2016-05-04 ENCOUNTER — Other Ambulatory Visit: Payer: Self-pay | Admitting: Internal Medicine

## 2016-05-04 ENCOUNTER — Ambulatory Visit
Admission: RE | Admit: 2016-05-04 | Discharge: 2016-05-04 | Disposition: A | Discharge status: Home / Self Care | Payer: Medicare Other | Source: Ambulatory Visit | Attending: Internal Medicine | Admitting: Internal Medicine

## 2016-05-04 DIAGNOSIS — Z1231 Encounter for screening mammogram for malignant neoplasm of breast: Secondary | ICD-10-CM | POA: Insufficient documentation

## 2016-05-06 DIAGNOSIS — M25562 Pain in left knee: Secondary | ICD-10-CM | POA: Diagnosis not present

## 2016-05-06 DIAGNOSIS — C8190 Hodgkin lymphoma, unspecified, unspecified site: Secondary | ICD-10-CM | POA: Diagnosis not present

## 2016-05-06 DIAGNOSIS — G5791 Unspecified mononeuropathy of right lower limb: Secondary | ICD-10-CM | POA: Diagnosis not present

## 2016-05-06 DIAGNOSIS — F418 Other specified anxiety disorders: Secondary | ICD-10-CM | POA: Diagnosis not present

## 2016-05-12 ENCOUNTER — Ambulatory Visit
Admission: RE | Admit: 2016-05-12 | Discharge: 2016-05-12 | Disposition: A | Discharge status: Home / Self Care | Payer: Medicare Other | Source: Ambulatory Visit | Attending: Neurology | Admitting: Neurology

## 2016-05-12 DIAGNOSIS — M5440 Lumbago with sciatica, unspecified side: Secondary | ICD-10-CM

## 2016-05-12 DIAGNOSIS — M48061 Spinal stenosis, lumbar region without neurogenic claudication: Secondary | ICD-10-CM | POA: Insufficient documentation

## 2016-05-12 DIAGNOSIS — M47896 Other spondylosis, lumbar region: Secondary | ICD-10-CM | POA: Insufficient documentation

## 2016-05-12 DIAGNOSIS — M5126 Other intervertebral disc displacement, lumbar region: Secondary | ICD-10-CM | POA: Diagnosis not present

## 2016-05-12 DIAGNOSIS — M545 Low back pain: Secondary | ICD-10-CM | POA: Diagnosis not present

## 2016-05-13 ENCOUNTER — Ambulatory Visit
Admission: RE | Admit: 2016-05-13 | Discharge: 2016-05-13 | Disposition: A | Discharge status: Home / Self Care | Payer: Medicare Other | Source: Ambulatory Visit | Attending: Internal Medicine | Admitting: Internal Medicine

## 2016-05-13 DIAGNOSIS — E041 Nontoxic single thyroid nodule: Secondary | ICD-10-CM | POA: Diagnosis not present

## 2016-05-13 DIAGNOSIS — C8203 Follicular lymphoma grade I, intra-abdominal lymph nodes: Secondary | ICD-10-CM | POA: Diagnosis not present

## 2016-05-13 DIAGNOSIS — C8590 Non-Hodgkin lymphoma, unspecified, unspecified site: Secondary | ICD-10-CM | POA: Diagnosis not present

## 2016-05-13 LAB — GLUCOSE, CAPILLARY: Glucose-Capillary: 90 mg/dL (ref 65–99)

## 2016-05-13 MED ORDER — FLUDEOXYGLUCOSE F - 18 (FDG) INJECTION
12.6500 | Freq: Once | INTRAVENOUS | Status: AC | PRN
  Administered 2016-05-13: 12.65 via INTRAVENOUS

## 2016-05-14 ENCOUNTER — Other Ambulatory Visit: Payer: Self-pay

## 2016-05-14 ENCOUNTER — Inpatient Hospital Stay: Payer: Medicare Other | Attending: Internal Medicine | Admitting: Internal Medicine

## 2016-05-14 VITALS — BP 130/79 | HR 88 | Temp 97.3°F | Resp 20 | Ht 66.5 in | Wt 164.4 lb

## 2016-05-14 DIAGNOSIS — M549 Dorsalgia, unspecified: Secondary | ICD-10-CM | POA: Insufficient documentation

## 2016-05-14 DIAGNOSIS — Z8572 Personal history of non-Hodgkin lymphomas: Secondary | ICD-10-CM | POA: Diagnosis not present

## 2016-05-14 DIAGNOSIS — G20 Parkinson's disease: Secondary | ICD-10-CM | POA: Diagnosis not present

## 2016-05-14 DIAGNOSIS — Z9221 Personal history of antineoplastic chemotherapy: Secondary | ICD-10-CM | POA: Insufficient documentation

## 2016-05-14 DIAGNOSIS — R918 Other nonspecific abnormal finding of lung field: Secondary | ICD-10-CM | POA: Diagnosis not present

## 2016-05-14 DIAGNOSIS — G8929 Other chronic pain: Secondary | ICD-10-CM | POA: Diagnosis not present

## 2016-05-14 DIAGNOSIS — K5730 Diverticulosis of large intestine without perforation or abscess without bleeding: Secondary | ICD-10-CM | POA: Insufficient documentation

## 2016-05-14 DIAGNOSIS — K449 Diaphragmatic hernia without obstruction or gangrene: Secondary | ICD-10-CM | POA: Insufficient documentation

## 2016-05-14 DIAGNOSIS — C8590 Non-Hodgkin lymphoma, unspecified, unspecified site: Secondary | ICD-10-CM

## 2016-05-14 DIAGNOSIS — Z87891 Personal history of nicotine dependence: Secondary | ICD-10-CM | POA: Insufficient documentation

## 2016-05-14 DIAGNOSIS — C8299 Follicular lymphoma, unspecified, extranodal and solid organ sites: Secondary | ICD-10-CM

## 2016-05-14 NOTE — Progress Notes (Signed)
[place] OFFICE PROGRESS NOTE  Patient Care Team: Cletis Athens, MD as PCP - General (Cardiology)  No matching staging information was found for the patient.   Oncology History   1. Superficial Protidectomy (right side) December 17/2013.  Consistent with low-grade CD10 and [id number] to follicular lymphoma grade 1-2.  Is positive for CD19 and cough of light chain.bone marrow aspiration and biopsy is negative .  PET scan was positive for some increased uptake in the spleen; Stage IVA disease PET scan is consistent with bilateral neck lymph node (December, 2013) 2. Weekly rituxan has been started.  August 24, 2012 3. Patient finished the last dose of Rituxan on September 14 2012  #  maintenance rituximab since April of 2014 patient has finished rituximabin December of 2015  # PET MARCH 2017 -Splenic recurrence; CT images stable. 4th October 2017- PET - NED.   # chronic back pain/PN/ Parkinsons [Dr.Potter]     Follicular lymphoma of extranodal and solid organ sites [facility])      INTERVAL HISTORY:  Sarah Donaldson 65 y.o.  female pleasant patient above history of Low-grade non-Hodgkin's lymphoma is here for follow-up/ To review the results of her PET scan.  Patient continues to have chronic low back pain radiating to the right getting worse. She is awaiting further workup to PCP   Patient denies any weight loss. Denies any night sweats. Denies any low-grade fevers.   REVIEW OF SYSTEMS:  A complete 10 point review of system is done which is negative except mentioned above/history of present illness.   PAST MEDICAL HISTORY :  Past Medical History:  Diagnosis Date  . Cancer ([place])    Non-hodgkin's lymphoma (in remission)  . Parkinson disease ([place])     PAST SURGICAL HISTORY :   Past Surgical History:  Procedure Laterality Date  . BUNIONECTOMY    . RETINAL DETACHMENT SURGERY      FAMILY HISTORY :  No family history on file.  SOCIAL HISTORY:   Social  History  Substance Use Topics  . Smoking status: Former Research scientist (life sciences)  . Smokeless tobacco: Never Used  . Alcohol use 0.0 oz/week    ALLERGIES:  is allergic to codeine sulfate.  MEDICATIONS:  Current Outpatient Prescriptions  Medication Sig Dispense Refill  . carbidopa-levodopa (SINEMET IR) 25-100 MG per tablet TAKE 1.5 tablets BY MOUTH every 4 hrs  0  . clonazePAM (KLONOPIN) 1 MG tablet Take 1 mg by mouth daily.     . furosemide (LASIX) 40 MG tablet Take 40 mg by mouth as needed for fluid. Twice a week    . meloxicam (MOBIC) 7.5 MG tablet Take 7.5 mg by mouth daily. 1 or 2 tablets daily    . omeprazole (PRILOSEC) 20 MG capsule Take 20 mg by mouth daily.  3  . pramipexole (MIRAPEX) 1.5 MG tablet Take 1.5 mg by mouth 3 (three) times daily.  5  . selegiline (ELDEPRYL) 5 MG capsule TAKE 1 CAPSULE BY MOUTH WITH BREAKFAST AND LUNCH.  1  . traZODone (DESYREL) 50 MG tablet TAKE 1-2 TABLETS NIGHTLY AS NEEDED FOR SLEEP.  1   No current facility-administered medications for this visit.     PHYSICAL EXAMINATION: ECOG PERFORMANCE STATUS: 0 - Asymptomatic  BP 130/79 (BP Location: Left Arm, Patient Position: Sitting)   Pulse 88   Temp 97.3 F (36.3 C)   Resp 20   Ht 5' 6.5" (1.689 m)   Wt 164 lb 6.4 oz (74.6 kg)   BMI 26.14 kg/m  Filed Weights   05/14/16 1433  Weight: 164 lb 6.4 oz (74.6 kg)    GENERAL: Well-nourished well-developed; Alert, no distress and comfortable.   With her husband.  EYES: no pallor or icterus OROPHARYNX: no thrush or ulceration; good dentition  NECK: supple, no masses felt LYMPH:  no palpable lymphadenopathy in the cervical, axillary or inguinal regions LUNGS: clear to auscultation and  No wheeze or crackles HEART/CVS: regular rate & rhythm and no murmurs; No lower extremity edema ABDOMEN:abdomen soft, non-tender and normal bowel sounds Musculoskeletal:no cyanosis of digits and no clubbing  PSYCH: alert & oriented x 3 with fluent speech NEURO: no focal  motor/sensory deficits SKIN:  no rashes or significant lesions  LABORATORY DATA:  I have reviewed the data as listed    Component Value Date/Time   NA 140 05/01/2016 1127   NA 139 12/03/2014 1038   K 4.1 05/01/2016 1127   K 3.6 12/03/2014 1038   CL 109 05/01/2016 1127   CL 106 12/03/2014 1038   CO2 25 05/01/2016 1127   CO2 28 12/03/2014 1038   GLUCOSE 100 (H) 05/01/2016 1127   GLUCOSE 94 12/03/2014 1038   BUN 23 (H) 05/01/2016 1127   BUN 21 (H) 12/03/2014 1038   CREATININE 0.49 05/01/2016 1127   CREATININE 0.65 12/03/2014 1038   CALCIUM 8.9 05/01/2016 1127   CALCIUM 9.1 12/03/2014 1038   PROT 6.6 05/01/2016 1127   PROT 6.9 12/03/2014 1038   ALBUMIN 4.0 05/01/2016 1127   ALBUMIN 4.2 12/03/2014 1038   AST 19 05/01/2016 1127   AST 19 12/03/2014 1038   ALT 6 (L) 05/01/2016 1127   ALT < 5 (L) 12/03/2014 1038   ALKPHOS 86 05/01/2016 1127   ALKPHOS 89 12/03/2014 1038   BILITOT 0.3 05/01/2016 1127   BILITOT 0.8 12/03/2014 1038   GFRNONAA >60 05/01/2016 1127   GFRNONAA >60 12/03/2014 1038   GFRAA >60 05/01/2016 1127   GFRAA >60 12/03/2014 1038    No results found for: SPEP, UPEP  Lab Results  Component Value Date   WBC 5.3 05/01/2016   NEUTROABS 3.2 05/01/2016   HGB 13.9 05/01/2016   HCT 41.2 05/01/2016   MCV 88.0 05/01/2016   PLT 190 05/01/2016      Chemistry      Component Value Date/Time   NA 140 05/01/2016 1127   NA 139 12/03/2014 1038   K 4.1 05/01/2016 1127   K 3.6 12/03/2014 1038   CL 109 05/01/2016 1127   CL 106 12/03/2014 1038   CO2 25 05/01/2016 1127   CO2 28 12/03/2014 1038   BUN 23 (H) 05/01/2016 1127   BUN 21 (H) 12/03/2014 1038   CREATININE 0.49 05/01/2016 1127   CREATININE 0.65 12/03/2014 1038      Component Value Date/Time   CALCIUM 8.9 05/01/2016 1127   CALCIUM 9.1 12/03/2014 1038   ALKPHOS 86 05/01/2016 1127   ALKPHOS 89 12/03/2014 1038   AST 19 05/01/2016 1127   AST 19 12/03/2014 1038   ALT 6 (L) 05/01/2016 1127   ALT < 5 (L)  12/03/2014 1038   BILITOT 0.3 05/01/2016 1127   BILITOT 0.8 12/03/2014 1038       RADIOGRAPHIC STUDIES: I have personally reviewed the radiological images as listed and agreed with the findings in the report. Nm Pet Image Restag (ps) Skull Base To Thigh  Result Date: 05/13/2016 CLINICAL DATA:  Subsequent treatment strategy for grade 1 follicular lymphoma. EXAM: NUCLEAR MEDICINE PET SKULL BASE TO THIGH TECHNIQUE: 12.7  mCi F-18 FDG was injected intravenously. Full-ring PET imaging was performed from the skull base to thigh after the radiotracer. CT data was obtained and used for attenuation correction and anatomic localization. FASTING BLOOD GLUCOSE:  Value: 90 mg/dl COMPARISON:  10/23/2015 PET-CT. FINDINGS: NECK No hypermetabolic lymph nodes in the neck. Mildly hypermetabolic 2.6 cm posterior left thyroid lobe nodule with max SUV 3.8, previously 2.6 cm with max SUV 3.2, not appreciably changed in size or metabolism. Mild symmetric hypermetabolism in the palatine tonsils (max SUV 5.3), without discrete mass on the CT images, previous max SUV 4.7, not appreciably changed, probably reactive/physiologic. CHEST No hypermetabolic axillary, mediastinal or hilar nodes. Atherosclerotic nonaneurysmal thoracic aorta. No pleural effusions. Basilar right lower lobe 3 mm solid pulmonary nodule (series 4/ image 109) is stable back to 08/05/2012 and considered benign, below PET resolution. No acute consolidative airspace disease, lung masses or new significant pulmonary nodules. ABDOMEN/PELVIS Spleen is stable and normal in size. No discrete splenic mass on the CT images. No splenic hypermetabolism (max SUV in the spleen is 2.7, previous max SUV in the spleen 7.4, decreased, and now below the liver in metabolism with max SUV in the liver 3.4). No abnormal hypermetabolic activity within the liver, pancreas or adrenal glands. No hypermetabolic lymph nodes in the abdomen or pelvis. Stable moderate hiatal hernia. Diffuse  colonic diverticulosis, most prominent and moderate in the sigmoid colon. Atherosclerotic nonaneurysmal abdominal aorta. SKELETON No focal hypermetabolic activity to suggest skeletal metastasis. IMPRESSION: 1. No metabolically active lymphoma. No residual splenic hypermetabolism. Normal size spleen. No hypermetabolic or enlarged lymph nodes. 2. Stable mildly hypermetabolic left thyroid lobe nodule, previously biopsied per prior report. 3. No additional findings include aortic atherosclerosis, moderate hiatal hernia and diffuse colonic diverticulosis. Electronically Signed   By: Ilona Sorrel M.D.   On: 05/13/2016 15:55     ASSESSMENT & PLAN:  Follicular lymphoma of extranodal and solid organ sites [facility]) # Low-grade follicular lymphoma; involving the spleen. Rituxan maintenance last December 2015. PET scan 2017 October 4th- NED.  # Chronic pain right hip low back question sciatica. Defer to pcp.   # 6 month follow up/labs; scan in 1year. Pt agrees.    No orders of the defined types were placed in this encounter.  All questions were answered. The patient knows to call the clinic with any problems, questions or concerns.      Cammie Sickle, MD 05/14/2016 3:17 PM

## 2016-05-14 NOTE — Assessment & Plan Note (Signed)
#   Low-grade follicular lymphoma; involving the spleen. Rituxan maintenance last December 2015. PET scan 2017 October 4th- NED.  # Chronic pain right hip low back question sciatica. Defer to pcp.   # 6 month follow up/labs; scan in 1year. Pt agrees.

## 2016-06-04 ENCOUNTER — Other Ambulatory Visit: Payer: Self-pay | Admitting: Neurosurgery

## 2016-06-04 DIAGNOSIS — M5416 Radiculopathy, lumbar region: Secondary | ICD-10-CM | POA: Diagnosis not present

## 2016-06-04 DIAGNOSIS — Z01818 Encounter for other preprocedural examination: Secondary | ICD-10-CM | POA: Diagnosis not present

## 2016-06-04 DIAGNOSIS — M419 Scoliosis, unspecified: Secondary | ICD-10-CM | POA: Diagnosis not present

## 2016-06-04 DIAGNOSIS — M47816 Spondylosis without myelopathy or radiculopathy, lumbar region: Secondary | ICD-10-CM | POA: Diagnosis not present

## 2016-06-05 DIAGNOSIS — H43811 Vitreous degeneration, right eye: Secondary | ICD-10-CM | POA: Diagnosis not present

## 2016-06-08 DIAGNOSIS — Z01818 Encounter for other preprocedural examination: Secondary | ICD-10-CM | POA: Diagnosis not present

## 2016-06-08 DIAGNOSIS — G5791 Unspecified mononeuropathy of right lower limb: Secondary | ICD-10-CM | POA: Diagnosis not present

## 2016-06-08 DIAGNOSIS — F418 Other specified anxiety disorders: Secondary | ICD-10-CM | POA: Diagnosis not present

## 2016-06-08 DIAGNOSIS — E785 Hyperlipidemia, unspecified: Secondary | ICD-10-CM | POA: Diagnosis not present

## 2016-06-09 ENCOUNTER — Ambulatory Visit
Admission: RE | Admit: 2016-06-09 | Discharge: 2016-06-09 | Disposition: A | Discharge status: Home / Self Care | Payer: Medicare Other | Source: Ambulatory Visit | Attending: Neurosurgery | Admitting: Neurosurgery

## 2016-06-09 DIAGNOSIS — M4186 Other forms of scoliosis, lumbar region: Secondary | ICD-10-CM | POA: Diagnosis not present

## 2016-06-09 DIAGNOSIS — M5416 Radiculopathy, lumbar region: Secondary | ICD-10-CM | POA: Insufficient documentation

## 2016-06-09 DIAGNOSIS — M48061 Spinal stenosis, lumbar region without neurogenic claudication: Secondary | ICD-10-CM | POA: Insufficient documentation

## 2016-06-09 DIAGNOSIS — M4807 Spinal stenosis, lumbosacral region: Secondary | ICD-10-CM | POA: Diagnosis not present

## 2016-06-09 DIAGNOSIS — M47896 Other spondylosis, lumbar region: Secondary | ICD-10-CM | POA: Insufficient documentation

## 2016-06-09 DIAGNOSIS — M2578 Osteophyte, vertebrae: Secondary | ICD-10-CM | POA: Insufficient documentation

## 2016-06-09 DIAGNOSIS — M4306 Spondylolysis, lumbar region: Secondary | ICD-10-CM | POA: Diagnosis not present

## 2016-06-09 DIAGNOSIS — M1288 Other specific arthropathies, not elsewhere classified, other specified site: Secondary | ICD-10-CM | POA: Diagnosis not present

## 2016-06-09 DIAGNOSIS — M5136 Other intervertebral disc degeneration, lumbar region: Secondary | ICD-10-CM | POA: Diagnosis not present

## 2016-06-11 DIAGNOSIS — M5416 Radiculopathy, lumbar region: Secondary | ICD-10-CM | POA: Diagnosis not present

## 2016-06-11 DIAGNOSIS — M79604 Pain in right leg: Secondary | ICD-10-CM | POA: Diagnosis not present

## 2016-06-15 DIAGNOSIS — F329 Major depressive disorder, single episode, unspecified: Secondary | ICD-10-CM | POA: Diagnosis present

## 2016-06-15 DIAGNOSIS — M48061 Spinal stenosis, lumbar region without neurogenic claudication: Secondary | ICD-10-CM | POA: Diagnosis not present

## 2016-06-15 DIAGNOSIS — M4726 Other spondylosis with radiculopathy, lumbar region: Secondary | ICD-10-CM | POA: Diagnosis present

## 2016-06-15 DIAGNOSIS — H9193 Unspecified hearing loss, bilateral: Secondary | ICD-10-CM | POA: Diagnosis present

## 2016-06-15 DIAGNOSIS — M4180 Other forms of scoliosis, site unspecified: Secondary | ICD-10-CM | POA: Diagnosis present

## 2016-06-15 DIAGNOSIS — E7800 Pure hypercholesterolemia, unspecified: Secondary | ICD-10-CM | POA: Diagnosis present

## 2016-06-15 DIAGNOSIS — M48062 Spinal stenosis, lumbar region with neurogenic claudication: Secondary | ICD-10-CM | POA: Diagnosis not present

## 2016-06-15 DIAGNOSIS — G20 Parkinson's disease: Secondary | ICD-10-CM | POA: Diagnosis present

## 2016-06-15 DIAGNOSIS — E669 Obesity, unspecified: Secondary | ICD-10-CM | POA: Diagnosis present

## 2016-06-15 DIAGNOSIS — K219 Gastro-esophageal reflux disease without esophagitis: Secondary | ICD-10-CM | POA: Diagnosis present

## 2016-06-15 DIAGNOSIS — Z8572 Personal history of non-Hodgkin lymphomas: Secondary | ICD-10-CM | POA: Diagnosis not present

## 2016-06-15 DIAGNOSIS — R0902 Hypoxemia: Secondary | ICD-10-CM | POA: Diagnosis not present

## 2016-06-15 DIAGNOSIS — F419 Anxiety disorder, unspecified: Secondary | ICD-10-CM | POA: Diagnosis present

## 2016-06-20 DIAGNOSIS — Z981 Arthrodesis status: Secondary | ICD-10-CM | POA: Diagnosis not present

## 2016-06-20 DIAGNOSIS — G20 Parkinson's disease: Secondary | ICD-10-CM | POA: Diagnosis not present

## 2016-06-20 DIAGNOSIS — F419 Anxiety disorder, unspecified: Secondary | ICD-10-CM | POA: Diagnosis not present

## 2016-06-20 DIAGNOSIS — F329 Major depressive disorder, single episode, unspecified: Secondary | ICD-10-CM | POA: Diagnosis not present

## 2016-06-20 DIAGNOSIS — Z4889 Encounter for other specified surgical aftercare: Secondary | ICD-10-CM | POA: Diagnosis not present

## 2016-06-20 DIAGNOSIS — M6281 Muscle weakness (generalized): Secondary | ICD-10-CM | POA: Diagnosis not present

## 2016-06-24 DIAGNOSIS — F419 Anxiety disorder, unspecified: Secondary | ICD-10-CM | POA: Diagnosis not present

## 2016-06-24 DIAGNOSIS — G20 Parkinson's disease: Secondary | ICD-10-CM | POA: Diagnosis not present

## 2016-06-24 DIAGNOSIS — F329 Major depressive disorder, single episode, unspecified: Secondary | ICD-10-CM | POA: Diagnosis not present

## 2016-06-24 DIAGNOSIS — Z981 Arthrodesis status: Secondary | ICD-10-CM | POA: Diagnosis not present

## 2016-06-24 DIAGNOSIS — M6281 Muscle weakness (generalized): Secondary | ICD-10-CM | POA: Diagnosis not present

## 2016-06-24 DIAGNOSIS — Z4889 Encounter for other specified surgical aftercare: Secondary | ICD-10-CM | POA: Diagnosis not present

## 2016-06-25 ENCOUNTER — Ambulatory Visit: Payer: Medicare Other

## 2016-06-25 DIAGNOSIS — Z4889 Encounter for other specified surgical aftercare: Secondary | ICD-10-CM | POA: Diagnosis not present

## 2016-06-25 DIAGNOSIS — G20 Parkinson's disease: Secondary | ICD-10-CM | POA: Diagnosis not present

## 2016-06-25 DIAGNOSIS — F419 Anxiety disorder, unspecified: Secondary | ICD-10-CM | POA: Diagnosis not present

## 2016-06-25 DIAGNOSIS — Z981 Arthrodesis status: Secondary | ICD-10-CM | POA: Diagnosis not present

## 2016-06-25 DIAGNOSIS — M6281 Muscle weakness (generalized): Secondary | ICD-10-CM | POA: Diagnosis not present

## 2016-06-25 DIAGNOSIS — F329 Major depressive disorder, single episode, unspecified: Secondary | ICD-10-CM | POA: Diagnosis not present

## 2016-06-26 DIAGNOSIS — F419 Anxiety disorder, unspecified: Secondary | ICD-10-CM | POA: Diagnosis not present

## 2016-06-26 DIAGNOSIS — Z4889 Encounter for other specified surgical aftercare: Secondary | ICD-10-CM | POA: Diagnosis not present

## 2016-06-26 DIAGNOSIS — G20 Parkinson's disease: Secondary | ICD-10-CM | POA: Diagnosis not present

## 2016-06-26 DIAGNOSIS — M6281 Muscle weakness (generalized): Secondary | ICD-10-CM | POA: Diagnosis not present

## 2016-06-26 DIAGNOSIS — F329 Major depressive disorder, single episode, unspecified: Secondary | ICD-10-CM | POA: Diagnosis not present

## 2016-06-26 DIAGNOSIS — Z981 Arthrodesis status: Secondary | ICD-10-CM | POA: Diagnosis not present

## 2016-06-29 DIAGNOSIS — Z4889 Encounter for other specified surgical aftercare: Secondary | ICD-10-CM | POA: Diagnosis not present

## 2016-06-29 DIAGNOSIS — Z981 Arthrodesis status: Secondary | ICD-10-CM | POA: Diagnosis not present

## 2016-06-29 DIAGNOSIS — M6281 Muscle weakness (generalized): Secondary | ICD-10-CM | POA: Diagnosis not present

## 2016-06-29 DIAGNOSIS — F329 Major depressive disorder, single episode, unspecified: Secondary | ICD-10-CM | POA: Diagnosis not present

## 2016-06-29 DIAGNOSIS — G20 Parkinson's disease: Secondary | ICD-10-CM | POA: Diagnosis not present

## 2016-06-29 DIAGNOSIS — F419 Anxiety disorder, unspecified: Secondary | ICD-10-CM | POA: Diagnosis not present

## 2016-07-06 DIAGNOSIS — Z981 Arthrodesis status: Secondary | ICD-10-CM | POA: Diagnosis not present

## 2016-07-06 DIAGNOSIS — F419 Anxiety disorder, unspecified: Secondary | ICD-10-CM | POA: Diagnosis not present

## 2016-07-06 DIAGNOSIS — F329 Major depressive disorder, single episode, unspecified: Secondary | ICD-10-CM | POA: Diagnosis not present

## 2016-07-06 DIAGNOSIS — G20 Parkinson's disease: Secondary | ICD-10-CM | POA: Diagnosis not present

## 2016-07-06 DIAGNOSIS — Z4889 Encounter for other specified surgical aftercare: Secondary | ICD-10-CM | POA: Diagnosis not present

## 2016-07-06 DIAGNOSIS — M6281 Muscle weakness (generalized): Secondary | ICD-10-CM | POA: Diagnosis not present

## 2016-07-08 DIAGNOSIS — F419 Anxiety disorder, unspecified: Secondary | ICD-10-CM | POA: Diagnosis not present

## 2016-07-08 DIAGNOSIS — F329 Major depressive disorder, single episode, unspecified: Secondary | ICD-10-CM | POA: Diagnosis not present

## 2016-07-08 DIAGNOSIS — G20 Parkinson's disease: Secondary | ICD-10-CM | POA: Diagnosis not present

## 2016-07-08 DIAGNOSIS — M6281 Muscle weakness (generalized): Secondary | ICD-10-CM | POA: Diagnosis not present

## 2016-07-08 DIAGNOSIS — Z981 Arthrodesis status: Secondary | ICD-10-CM | POA: Diagnosis not present

## 2016-07-08 DIAGNOSIS — Z4889 Encounter for other specified surgical aftercare: Secondary | ICD-10-CM | POA: Diagnosis not present

## 2016-07-10 DIAGNOSIS — Z4889 Encounter for other specified surgical aftercare: Secondary | ICD-10-CM | POA: Diagnosis not present

## 2016-07-10 DIAGNOSIS — M6281 Muscle weakness (generalized): Secondary | ICD-10-CM | POA: Diagnosis not present

## 2016-07-10 DIAGNOSIS — Z981 Arthrodesis status: Secondary | ICD-10-CM | POA: Diagnosis not present

## 2016-07-10 DIAGNOSIS — F419 Anxiety disorder, unspecified: Secondary | ICD-10-CM | POA: Diagnosis not present

## 2016-07-10 DIAGNOSIS — G20 Parkinson's disease: Secondary | ICD-10-CM | POA: Diagnosis not present

## 2016-07-10 DIAGNOSIS — F329 Major depressive disorder, single episode, unspecified: Secondary | ICD-10-CM | POA: Diagnosis not present

## 2016-07-13 DIAGNOSIS — Z4889 Encounter for other specified surgical aftercare: Secondary | ICD-10-CM | POA: Diagnosis not present

## 2016-07-13 DIAGNOSIS — G20 Parkinson's disease: Secondary | ICD-10-CM | POA: Diagnosis present

## 2016-07-13 DIAGNOSIS — F329 Major depressive disorder, single episode, unspecified: Secondary | ICD-10-CM | POA: Diagnosis not present

## 2016-07-13 DIAGNOSIS — K219 Gastro-esophageal reflux disease without esophagitis: Secondary | ICD-10-CM | POA: Diagnosis present

## 2016-07-13 DIAGNOSIS — F419 Anxiety disorder, unspecified: Secondary | ICD-10-CM | POA: Diagnosis not present

## 2016-07-13 DIAGNOSIS — M545 Low back pain: Secondary | ICD-10-CM | POA: Diagnosis not present

## 2016-07-13 DIAGNOSIS — M47816 Spondylosis without myelopathy or radiculopathy, lumbar region: Secondary | ICD-10-CM | POA: Diagnosis not present

## 2016-07-13 DIAGNOSIS — H5461 Unqualified visual loss, right eye, normal vision left eye: Secondary | ICD-10-CM | POA: Diagnosis present

## 2016-07-13 DIAGNOSIS — Z981 Arthrodesis status: Secondary | ICD-10-CM | POA: Diagnosis not present

## 2016-07-13 DIAGNOSIS — M4316 Spondylolisthesis, lumbar region: Secondary | ICD-10-CM | POA: Diagnosis not present

## 2016-07-13 DIAGNOSIS — T814XXA Infection following a procedure, initial encounter: Secondary | ICD-10-CM | POA: Diagnosis not present

## 2016-07-13 DIAGNOSIS — E785 Hyperlipidemia, unspecified: Secondary | ICD-10-CM | POA: Diagnosis present

## 2016-07-13 DIAGNOSIS — Z8572 Personal history of non-Hodgkin lymphomas: Secondary | ICD-10-CM | POA: Diagnosis not present

## 2016-07-13 DIAGNOSIS — M6281 Muscle weakness (generalized): Secondary | ICD-10-CM | POA: Diagnosis not present

## 2016-07-13 DIAGNOSIS — Z87891 Personal history of nicotine dependence: Secondary | ICD-10-CM | POA: Diagnosis not present

## 2016-07-20 DIAGNOSIS — Z4889 Encounter for other specified surgical aftercare: Secondary | ICD-10-CM | POA: Diagnosis not present

## 2016-07-20 DIAGNOSIS — F419 Anxiety disorder, unspecified: Secondary | ICD-10-CM | POA: Diagnosis not present

## 2016-07-20 DIAGNOSIS — M6281 Muscle weakness (generalized): Secondary | ICD-10-CM | POA: Diagnosis not present

## 2016-07-20 DIAGNOSIS — Z981 Arthrodesis status: Secondary | ICD-10-CM | POA: Diagnosis not present

## 2016-07-20 DIAGNOSIS — F329 Major depressive disorder, single episode, unspecified: Secondary | ICD-10-CM | POA: Diagnosis not present

## 2016-07-20 DIAGNOSIS — G20 Parkinson's disease: Secondary | ICD-10-CM | POA: Diagnosis not present

## 2016-07-23 DIAGNOSIS — F329 Major depressive disorder, single episode, unspecified: Secondary | ICD-10-CM | POA: Diagnosis not present

## 2016-07-23 DIAGNOSIS — Z981 Arthrodesis status: Secondary | ICD-10-CM | POA: Diagnosis not present

## 2016-07-23 DIAGNOSIS — G20 Parkinson's disease: Secondary | ICD-10-CM | POA: Diagnosis not present

## 2016-07-23 DIAGNOSIS — Z4889 Encounter for other specified surgical aftercare: Secondary | ICD-10-CM | POA: Diagnosis not present

## 2016-07-23 DIAGNOSIS — F419 Anxiety disorder, unspecified: Secondary | ICD-10-CM | POA: Diagnosis not present

## 2016-07-23 DIAGNOSIS — M6281 Muscle weakness (generalized): Secondary | ICD-10-CM | POA: Diagnosis not present

## 2016-07-28 DIAGNOSIS — Z4889 Encounter for other specified surgical aftercare: Secondary | ICD-10-CM | POA: Diagnosis not present

## 2016-07-28 DIAGNOSIS — F419 Anxiety disorder, unspecified: Secondary | ICD-10-CM | POA: Diagnosis not present

## 2016-07-28 DIAGNOSIS — M6281 Muscle weakness (generalized): Secondary | ICD-10-CM | POA: Diagnosis not present

## 2016-07-28 DIAGNOSIS — G20 Parkinson's disease: Secondary | ICD-10-CM | POA: Diagnosis not present

## 2016-07-28 DIAGNOSIS — F329 Major depressive disorder, single episode, unspecified: Secondary | ICD-10-CM | POA: Diagnosis not present

## 2016-07-28 DIAGNOSIS — Z981 Arthrodesis status: Secondary | ICD-10-CM | POA: Diagnosis not present

## 2016-07-30 DIAGNOSIS — M6281 Muscle weakness (generalized): Secondary | ICD-10-CM | POA: Diagnosis not present

## 2016-07-30 DIAGNOSIS — Z981 Arthrodesis status: Secondary | ICD-10-CM | POA: Diagnosis not present

## 2016-07-30 DIAGNOSIS — Z4889 Encounter for other specified surgical aftercare: Secondary | ICD-10-CM | POA: Diagnosis not present

## 2016-07-30 DIAGNOSIS — G20 Parkinson's disease: Secondary | ICD-10-CM | POA: Diagnosis not present

## 2016-07-30 DIAGNOSIS — F419 Anxiety disorder, unspecified: Secondary | ICD-10-CM | POA: Diagnosis not present

## 2016-07-30 DIAGNOSIS — F329 Major depressive disorder, single episode, unspecified: Secondary | ICD-10-CM | POA: Diagnosis not present

## 2016-08-05 DIAGNOSIS — F419 Anxiety disorder, unspecified: Secondary | ICD-10-CM | POA: Diagnosis not present

## 2016-08-05 DIAGNOSIS — Z981 Arthrodesis status: Secondary | ICD-10-CM | POA: Diagnosis not present

## 2016-08-05 DIAGNOSIS — G20 Parkinson's disease: Secondary | ICD-10-CM | POA: Diagnosis not present

## 2016-08-05 DIAGNOSIS — F329 Major depressive disorder, single episode, unspecified: Secondary | ICD-10-CM | POA: Diagnosis not present

## 2016-08-05 DIAGNOSIS — Z4889 Encounter for other specified surgical aftercare: Secondary | ICD-10-CM | POA: Diagnosis not present

## 2016-08-05 DIAGNOSIS — M6281 Muscle weakness (generalized): Secondary | ICD-10-CM | POA: Diagnosis not present

## 2016-08-07 DIAGNOSIS — M6281 Muscle weakness (generalized): Secondary | ICD-10-CM | POA: Diagnosis not present

## 2016-08-07 DIAGNOSIS — G20 Parkinson's disease: Secondary | ICD-10-CM | POA: Diagnosis not present

## 2016-08-07 DIAGNOSIS — F329 Major depressive disorder, single episode, unspecified: Secondary | ICD-10-CM | POA: Diagnosis not present

## 2016-08-07 DIAGNOSIS — Z4889 Encounter for other specified surgical aftercare: Secondary | ICD-10-CM | POA: Diagnosis not present

## 2016-08-07 DIAGNOSIS — F419 Anxiety disorder, unspecified: Secondary | ICD-10-CM | POA: Diagnosis not present

## 2016-08-07 DIAGNOSIS — Z981 Arthrodesis status: Secondary | ICD-10-CM | POA: Diagnosis not present

## 2016-08-31 DIAGNOSIS — R42 Dizziness and giddiness: Secondary | ICD-10-CM | POA: Diagnosis not present

## 2016-08-31 DIAGNOSIS — Z8572 Personal history of non-Hodgkin lymphomas: Secondary | ICD-10-CM | POA: Diagnosis not present

## 2016-10-01 DIAGNOSIS — M48061 Spinal stenosis, lumbar region without neurogenic claudication: Secondary | ICD-10-CM | POA: Diagnosis not present

## 2016-10-01 DIAGNOSIS — M48062 Spinal stenosis, lumbar region with neurogenic claudication: Secondary | ICD-10-CM | POA: Diagnosis not present

## 2016-10-19 DIAGNOSIS — M5416 Radiculopathy, lumbar region: Secondary | ICD-10-CM | POA: Diagnosis not present

## 2016-11-01 DIAGNOSIS — M5416 Radiculopathy, lumbar region: Secondary | ICD-10-CM | POA: Insufficient documentation

## 2016-11-09 DIAGNOSIS — D2239 Melanocytic nevi of other parts of face: Secondary | ICD-10-CM | POA: Diagnosis not present

## 2016-11-09 DIAGNOSIS — L821 Other seborrheic keratosis: Secondary | ICD-10-CM | POA: Diagnosis not present

## 2016-11-09 DIAGNOSIS — D485 Neoplasm of uncertain behavior of skin: Secondary | ICD-10-CM | POA: Diagnosis not present

## 2016-11-09 DIAGNOSIS — D229 Melanocytic nevi, unspecified: Secondary | ICD-10-CM | POA: Diagnosis not present

## 2016-11-09 DIAGNOSIS — L578 Other skin changes due to chronic exposure to nonionizing radiation: Secondary | ICD-10-CM | POA: Diagnosis not present

## 2016-11-09 DIAGNOSIS — D1800 Hemangioma unspecified site: Secondary | ICD-10-CM | POA: Diagnosis not present

## 2016-11-12 ENCOUNTER — Ambulatory Visit: Payer: Medicare Other | Admitting: Internal Medicine

## 2016-11-12 ENCOUNTER — Other Ambulatory Visit: Payer: Medicare Other

## 2016-11-13 ENCOUNTER — Inpatient Hospital Stay (HOSPITAL_BASED_OUTPATIENT_CLINIC_OR_DEPARTMENT_OTHER): Payer: Medicare Other | Admitting: Internal Medicine

## 2016-11-13 ENCOUNTER — Inpatient Hospital Stay: Payer: Medicare Other | Attending: Internal Medicine

## 2016-11-13 VITALS — BP 118/75 | HR 89 | Temp 96.2°F | Resp 18 | Wt 169.4 lb

## 2016-11-13 DIAGNOSIS — Z8572 Personal history of non-Hodgkin lymphomas: Secondary | ICD-10-CM

## 2016-11-13 DIAGNOSIS — M549 Dorsalgia, unspecified: Secondary | ICD-10-CM | POA: Insufficient documentation

## 2016-11-13 DIAGNOSIS — G8929 Other chronic pain: Secondary | ICD-10-CM | POA: Insufficient documentation

## 2016-11-13 DIAGNOSIS — Z79899 Other long term (current) drug therapy: Secondary | ICD-10-CM

## 2016-11-13 DIAGNOSIS — Z87891 Personal history of nicotine dependence: Secondary | ICD-10-CM | POA: Diagnosis not present

## 2016-11-13 DIAGNOSIS — G20 Parkinson's disease: Secondary | ICD-10-CM

## 2016-11-13 DIAGNOSIS — C8590 Non-Hodgkin lymphoma, unspecified, unspecified site: Secondary | ICD-10-CM

## 2016-11-13 DIAGNOSIS — G629 Polyneuropathy, unspecified: Secondary | ICD-10-CM | POA: Diagnosis not present

## 2016-11-13 DIAGNOSIS — C8299 Follicular lymphoma, unspecified, extranodal and solid organ sites: Secondary | ICD-10-CM

## 2016-11-13 LAB — CBC WITH DIFFERENTIAL/PLATELET
Basophils Absolute: 0 10*3/uL (ref 0–0.1)
Basophils Relative: 1 %
Eosinophils Absolute: 0.1 10*3/uL (ref 0–0.7)
Eosinophils Relative: 2 %
HCT: 40.5 % (ref 35.0–47.0)
Hemoglobin: 13.5 g/dL (ref 12.0–16.0)
Lymphocytes Relative: 23 %
Lymphs Abs: 1.4 10*3/uL (ref 1.0–3.6)
MCH: 28.5 pg (ref 26.0–34.0)
MCHC: 33.4 g/dL (ref 32.0–36.0)
MCV: 85.3 fL (ref 80.0–100.0)
Monocytes Absolute: 0.5 10*3/uL (ref 0.2–0.9)
Monocytes Relative: 9 %
Neutro Abs: 3.8 10*3/uL (ref 1.4–6.5)
Neutrophils Relative %: 65 %
Platelets: 178 10*3/uL (ref 150–440)
RBC: 4.75 MIL/uL (ref 3.80–5.20)
RDW: 14.5 % (ref 11.5–14.5)
WBC: 5.8 10*3/uL (ref 3.6–11.0)

## 2016-11-13 LAB — COMPREHENSIVE METABOLIC PANEL
ALT: <5 U/L — ABNORMAL LOW (ref 14–54)
AST: 16 U/L (ref 15–41)
Albumin: 3.9 g/dL (ref 3.5–5.0)
Alkaline Phosphatase: 93 U/L (ref 38–126)
Anion gap: 6 (ref 5–15)
BUN: 25 mg/dL — ABNORMAL HIGH (ref 6–20)
CO2: 25 mmol/L (ref 22–32)
Calcium: 9 mg/dL (ref 8.9–10.3)
Chloride: 108 mmol/L (ref 101–111)
Creatinine, Ser: 0.54 mg/dL (ref 0.44–1.00)
GFR calc Af Amer: >60 mL/min (ref >60)
GFR calc non Af Amer: >60 mL/min (ref >60)
Glucose, Bld: 114 mg/dL — ABNORMAL HIGH (ref 65–99)
Potassium: 3.8 mmol/L (ref 3.5–5.1)
Sodium: 139 mmol/L (ref 135–145)
Total Bilirubin: 0.6 mg/dL (ref 0.3–1.2)
Total Protein: 6.5 g/dL (ref 6.5–8.1)

## 2016-11-13 LAB — LACTATE DEHYDROGENASE: LDH: 164 U/L (ref 98–192)

## 2016-11-13 NOTE — Progress Notes (Signed)
[place] OFFICE PROGRESS NOTE  Patient Care Team: Cletis Athens, MD as PCP - General (Cardiology)  Cancer Staging No matching staging information was found for the patient.   Oncology History   1. Superficial Protidectomy (right side) December 17/2013.  Consistent with low-grade CD10 and [id number] to follicular lymphoma grade 1-2.  Is positive for CD19 and cough of light chain.bone marrow aspiration and biopsy is negative .  PET scan was positive for some increased uptake in the spleen; Stage IVA disease PET scan is consistent with bilateral neck lymph node (December, 2013) 2. Weekly rituxan has been started.  August 24, 2012 3. Patient finished the last dose of Rituxan on September 14 2012  #  maintenance rituximab since April of 2014 patient has finished rituximabin December of 2015  # PET MARCH 2017 -Splenic recurrence; CT images stable. 4th October 2017- PET - NED.   # chronic back pain/PN/ Parkinsons [Dr.Potter]     Follicular lymphoma of extranodal and solid organ sites [facility])      INTERVAL HISTORY:  Sarah Donaldson 66 y.o.  female pleasant patient above history of Low-grade non-Hodgkin's lymphoma is here for follow-up.  In the interim patient had back surgery [facility]; pain improved cannot completely resolved. She has been recently started Neurontin; complains of drowsiness.   Patient denies any weight loss. Denies any night sweats. Denies any low-grade fevers.   REVIEW OF SYSTEMS:  A complete 10 point review of system is done which is negative except mentioned above/history of present illness.   PAST MEDICAL HISTORY :  Past Medical History:  Diagnosis Date  . Cancer ([place])    Non-hodgkin's lymphoma (in remission)  . Parkinson disease ([place])     PAST SURGICAL HISTORY :   Past Surgical History:  Procedure Laterality Date  . BUNIONECTOMY    . RETINAL DETACHMENT SURGERY      FAMILY HISTORY :  No family history on file.  SOCIAL HISTORY:    Social History  Substance Use Topics  . Smoking status: Former Research scientist (life sciences)  . Smokeless tobacco: Never Used  . Alcohol use 0.0 oz/week    ALLERGIES:  is allergic to codeine sulfate.  MEDICATIONS:  Current Outpatient Prescriptions  Medication Sig Dispense Refill  . carbidopa-levodopa (SINEMET IR) 25-100 MG per tablet TAKE 1.5 tablets BY MOUTH every 4 hrs  0  . clonazePAM (KLONOPIN) 1 MG tablet Take 1 mg by mouth daily.     . furosemide (LASIX) 40 MG tablet Take 40 mg by mouth as needed for fluid. Twice a week    . gabapentin (NEURONTIN) 300 MG capsule Take 300 mg by mouth 3 (three) times daily.    . meloxicam (MOBIC) 7.5 MG tablet Take 7.5 mg by mouth daily. 1 or 2 tablets daily    . omeprazole (PRILOSEC) 20 MG capsule Take 20 mg by mouth daily.  3  . pramipexole (MIRAPEX) 1.5 MG tablet Take 1.5 mg by mouth 3 (three) times daily.  5  . selegiline (ELDEPRYL) 5 MG capsule TAKE 1 CAPSULE BY MOUTH WITH BREAKFAST AND LUNCH.  1  . traZODone (DESYREL) 50 MG tablet TAKE 1-2 TABLETS NIGHTLY AS NEEDED FOR SLEEP.  1   No current facility-administered medications for this visit.     PHYSICAL EXAMINATION: ECOG PERFORMANCE STATUS: 0 - Asymptomatic  BP 118/75 (BP Location: Left Arm, Patient Position: Sitting)   Pulse 89   Temp (!) 96.2 F (35.7 C) (Tympanic)   Resp 18   Wt 169 lb 6  oz (76.8 kg)   BMI 26.93 kg/m   Filed Weights   11/13/16 1429  Weight: 169 lb 6 oz (76.8 kg)    GENERAL: Well-nourished well-developed; Alert, no distress and comfortable.   With her husband.  EYES: no pallor or icterus OROPHARYNX: no thrush or ulceration; good dentition  NECK: supple, no masses felt LYMPH:  no palpable lymphadenopathy in the cervical, axillary or inguinal regions LUNGS: clear to auscultation and  No wheeze or crackles HEART/CVS: regular rate & rhythm and no murmurs; No lower extremity edema ABDOMEN:abdomen soft, non-tender and normal bowel sounds Musculoskeletal:no cyanosis of digits and no  clubbing  PSYCH: alert & oriented x 3 with fluent speech NEURO: no focal motor/sensory deficits SKIN:  no rashes or significant lesions  LABORATORY DATA:  I have reviewed the data as listed    Component Value Date/Time   NA 139 11/13/2016 1410   NA 139 12/03/2014 1038   K 3.8 11/13/2016 1410   K 3.6 12/03/2014 1038   CL 108 11/13/2016 1410   CL 106 12/03/2014 1038   CO2 25 11/13/2016 1410   CO2 28 12/03/2014 1038   GLUCOSE 114 (H) 11/13/2016 1410   GLUCOSE 94 12/03/2014 1038   BUN 25 (H) 11/13/2016 1410   BUN 21 (H) 12/03/2014 1038   CREATININE 0.54 11/13/2016 1410   CREATININE 0.65 12/03/2014 1038   CALCIUM 9.0 11/13/2016 1410   CALCIUM 9.1 12/03/2014 1038   PROT 6.5 11/13/2016 1410   PROT 6.9 12/03/2014 1038   ALBUMIN 3.9 11/13/2016 1410   ALBUMIN 4.2 12/03/2014 1038   AST 16 11/13/2016 1410   AST 19 12/03/2014 1038   ALT <5 (L) 11/13/2016 1410   ALT < 5 (L) 12/03/2014 1038   ALKPHOS 93 11/13/2016 1410   ALKPHOS 89 12/03/2014 1038   BILITOT 0.6 11/13/2016 1410   BILITOT 0.8 12/03/2014 1038   GFRNONAA >60 11/13/2016 1410   GFRNONAA >60 12/03/2014 1038   GFRAA >60 11/13/2016 1410   GFRAA >60 12/03/2014 1038    No results found for: SPEP, UPEP  Lab Results  Component Value Date   WBC 5.8 11/13/2016   NEUTROABS 3.8 11/13/2016   HGB 13.5 11/13/2016   HCT 40.5 11/13/2016   MCV 85.3 11/13/2016   PLT 178 11/13/2016      Chemistry      Component Value Date/Time   NA 139 11/13/2016 1410   NA 139 12/03/2014 1038   K 3.8 11/13/2016 1410   K 3.6 12/03/2014 1038   CL 108 11/13/2016 1410   CL 106 12/03/2014 1038   CO2 25 11/13/2016 1410   CO2 28 12/03/2014 1038   BUN 25 (H) 11/13/2016 1410   BUN 21 (H) 12/03/2014 1038   CREATININE 0.54 11/13/2016 1410   CREATININE 0.65 12/03/2014 1038      Component Value Date/Time   CALCIUM 9.0 11/13/2016 1410   CALCIUM 9.1 12/03/2014 1038   ALKPHOS 93 11/13/2016 1410   ALKPHOS 89 12/03/2014 1038   AST 16 11/13/2016  1410   AST 19 12/03/2014 1038   ALT <5 (L) 11/13/2016 1410   ALT < 5 (L) 12/03/2014 1038   BILITOT 0.6 11/13/2016 1410   BILITOT 0.8 12/03/2014 1038       RADIOGRAPHIC STUDIES: I have personally reviewed the radiological images as listed and agreed with the findings in the report. No results found.   ASSESSMENT & PLAN:  Follicular lymphoma of extranodal and solid organ sites [facility]) # Low-grade follicular lymphoma; involving the  spleen. Rituxan maintenance last December 2015. PET scan 2017 October 4th- NED. Clinically no evidence of recurrence.   # s/p back surgery/ Chronic pain right hip low back/sciatica. Drowsiness on Neurontin. Recommend starting Neurontin 300 mg at night; and a ramp up to 3 times a day over 1-2 weeks.  #  We will will plan PET in 6 months/labs/  M.D. visit.    Orders Placed This Encounter  Procedures  . NM PET Image Restag (PS) Skull Base To Thigh    Standing Status:   Future    Standing Expiration Date:   01/13/2018    Order Specific Question:   Reason for Exam (SYMPTOM  OR DIAGNOSIS REQUIRED)    Answer:   lymphoma    Order Specific Question:   Preferred imaging location?    Answer:   [facility]  . CBC with Differential/Platelet    Standing Status:   Future    Standing Expiration Date:   11/13/2017  . Comprehensive metabolic panel    Standing Status:   Future    Standing Expiration Date:   11/13/2017  . Lactate dehydrogenase    Standing Status:   Future    Standing Expiration Date:   11/13/2017   All questions were answered. The patient knows to call the clinic with any problems, questions or concerns.      Cammie Sickle, MD 11/13/2016 4:33 PM

## 2016-11-13 NOTE — Assessment & Plan Note (Addendum)
#   Low-grade follicular lymphoma; involving the spleen. Rituxan maintenance last December 2015. PET scan 2017 October 4th- NED. Clinically no evidence of recurrence.   # s/p back surgery/ Chronic pain right hip low back/sciatica. Drowsiness on Neurontin. Recommend starting Neurontin 300 mg at night; and a ramp up to 3 times a day over 1-2 weeks.  #  We will will plan PET in 6 months/labs/  M.D. visit.

## 2016-11-13 NOTE — Progress Notes (Signed)
Patient had back surgery in November.  Patient recently started on Gabepentin.  She is supposed to take 300 mg 3 times a day.  She has only taken one pill today and states she feels drunk.

## 2016-11-23 DIAGNOSIS — J039 Acute tonsillitis, unspecified: Secondary | ICD-10-CM | POA: Diagnosis not present

## 2016-11-23 DIAGNOSIS — L040 Acute lymphadenitis of face, head and neck: Secondary | ICD-10-CM | POA: Diagnosis not present

## 2016-11-23 DIAGNOSIS — R42 Dizziness and giddiness: Secondary | ICD-10-CM | POA: Diagnosis not present

## 2016-12-04 DIAGNOSIS — R42 Dizziness and giddiness: Secondary | ICD-10-CM | POA: Diagnosis not present

## 2016-12-10 ENCOUNTER — Other Ambulatory Visit: Payer: Self-pay | Admitting: Unknown Physician Specialty

## 2016-12-10 DIAGNOSIS — J039 Acute tonsillitis, unspecified: Secondary | ICD-10-CM | POA: Diagnosis not present

## 2016-12-10 DIAGNOSIS — E041 Nontoxic single thyroid nodule: Secondary | ICD-10-CM

## 2016-12-10 DIAGNOSIS — R42 Dizziness and giddiness: Secondary | ICD-10-CM | POA: Diagnosis not present

## 2016-12-11 ENCOUNTER — Ambulatory Visit
Admission: RE | Admit: 2016-12-11 | Discharge: 2016-12-11 | Disposition: A | Discharge status: Home / Self Care | Payer: Medicare Other | Source: Ambulatory Visit | Attending: Unknown Physician Specialty | Admitting: Unknown Physician Specialty

## 2016-12-11 DIAGNOSIS — E041 Nontoxic single thyroid nodule: Secondary | ICD-10-CM | POA: Diagnosis not present

## 2017-01-06 DIAGNOSIS — M545 Low back pain: Secondary | ICD-10-CM | POA: Diagnosis not present

## 2017-01-06 DIAGNOSIS — G4752 REM sleep behavior disorder: Secondary | ICD-10-CM | POA: Diagnosis not present

## 2017-01-06 DIAGNOSIS — G20 Parkinson's disease: Secondary | ICD-10-CM | POA: Diagnosis not present

## 2017-01-06 DIAGNOSIS — G8929 Other chronic pain: Secondary | ICD-10-CM | POA: Diagnosis not present

## 2017-01-15 DIAGNOSIS — M47816 Spondylosis without myelopathy or radiculopathy, lumbar region: Secondary | ICD-10-CM | POA: Diagnosis not present

## 2017-01-15 DIAGNOSIS — M461 Sacroiliitis, not elsewhere classified: Secondary | ICD-10-CM | POA: Diagnosis not present

## 2017-01-15 DIAGNOSIS — Z981 Arthrodesis status: Secondary | ICD-10-CM | POA: Diagnosis not present

## 2017-01-15 DIAGNOSIS — M48062 Spinal stenosis, lumbar region with neurogenic claudication: Secondary | ICD-10-CM | POA: Diagnosis not present

## 2017-01-18 DIAGNOSIS — M545 Low back pain, unspecified: Secondary | ICD-10-CM | POA: Insufficient documentation

## 2017-01-18 DIAGNOSIS — G4752 REM sleep behavior disorder: Secondary | ICD-10-CM | POA: Insufficient documentation

## 2017-01-22 DIAGNOSIS — M461 Sacroiliitis, not elsewhere classified: Secondary | ICD-10-CM | POA: Diagnosis not present

## 2017-02-03 DIAGNOSIS — E041 Nontoxic single thyroid nodule: Secondary | ICD-10-CM | POA: Diagnosis not present

## 2017-02-19 ENCOUNTER — Ambulatory Visit (INDEPENDENT_AMBULATORY_CARE_PROVIDER_SITE_OTHER): Payer: Medicare Other

## 2017-02-19 ENCOUNTER — Ambulatory Visit (INDEPENDENT_AMBULATORY_CARE_PROVIDER_SITE_OTHER): Payer: Medicare Other | Admitting: Podiatry

## 2017-02-19 DIAGNOSIS — S99921A Unspecified injury of right foot, initial encounter: Secondary | ICD-10-CM

## 2017-02-25 DIAGNOSIS — M461 Sacroiliitis, not elsewhere classified: Secondary | ICD-10-CM | POA: Diagnosis not present

## 2017-02-28 NOTE — Progress Notes (Signed)
   HPI: Patient presents today with a new complaint of an injury to the right second toe approximately 1 day prior to visit. Patient stubbed his second toe last night and immediately noticed bruising with swelling. Painful to walk. Patient presents today for further treatment   Physical Exam: General: The patient is alert and oriented x3 in no acute distress.  Dermatology: Skin is warm, dry and supple bilateral lower extremities. Negative for open lesions or macerations.  Vascular: Edema with ecchymosis noted to the second digit right foot  Neurological: Epicritic and protective threshold grossly intact bilaterally.   Musculoskeletal Exam: Pain on palpation noted to the second digit right foot.   Radiographic Exam:   Mildly displaced PIPJ digits disarticulation with a nondisplaced fracture of the middle phalanx digit right foot  Assessment: 1.  fracture/dislocation PIPJ second digit right foot   Plan of Care:  1. Patient was evaluated. 2.  today wearing recommended buddy splinting daily with a postoperative shoe times 4-6 weeks   3. Meloxicam 15 mg when necessary patient are as a prescription for meloxicam.    4. Postoperative shoe dispensed today  5. Return to clinic in 4 weeks for follow-up x-rays    Edrick Kins, DPM [facility]  Dr. Edrick Kins, DPM    [address], [place] [address]                Office [phone]  Fax [phone]

## 2017-03-12 DIAGNOSIS — K219 Gastro-esophageal reflux disease without esophagitis: Secondary | ICD-10-CM | POA: Diagnosis not present

## 2017-03-12 DIAGNOSIS — S80822S Blister (nonthermal), left lower leg, sequela: Secondary | ICD-10-CM | POA: Diagnosis not present

## 2017-03-12 DIAGNOSIS — C8190 Hodgkin lymphoma, unspecified, unspecified site: Secondary | ICD-10-CM | POA: Diagnosis not present

## 2017-03-12 DIAGNOSIS — M25562 Pain in left knee: Secondary | ICD-10-CM | POA: Diagnosis not present

## 2017-04-01 DIAGNOSIS — M5416 Radiculopathy, lumbar region: Secondary | ICD-10-CM | POA: Diagnosis not present

## 2017-04-02 DIAGNOSIS — H209 Unspecified iridocyclitis: Secondary | ICD-10-CM | POA: Diagnosis not present

## 2017-04-05 ENCOUNTER — Other Ambulatory Visit: Payer: Self-pay | Admitting: Internal Medicine

## 2017-04-05 DIAGNOSIS — Z1231 Encounter for screening mammogram for malignant neoplasm of breast: Secondary | ICD-10-CM

## 2017-04-23 DIAGNOSIS — M48062 Spinal stenosis, lumbar region with neurogenic claudication: Secondary | ICD-10-CM | POA: Diagnosis not present

## 2017-04-23 DIAGNOSIS — M5136 Other intervertebral disc degeneration, lumbar region: Secondary | ICD-10-CM | POA: Diagnosis not present

## 2017-04-23 DIAGNOSIS — M5416 Radiculopathy, lumbar region: Secondary | ICD-10-CM | POA: Diagnosis not present

## 2017-05-04 DIAGNOSIS — H209 Unspecified iridocyclitis: Secondary | ICD-10-CM | POA: Diagnosis not present

## 2017-05-05 ENCOUNTER — Ambulatory Visit
Admission: RE | Admit: 2017-05-05 | Discharge: 2017-05-05 | Disposition: A | Discharge status: Home / Self Care | Payer: Medicare Other | Source: Ambulatory Visit | Attending: Internal Medicine | Admitting: Internal Medicine

## 2017-05-05 DIAGNOSIS — Z1231 Encounter for screening mammogram for malignant neoplasm of breast: Secondary | ICD-10-CM | POA: Insufficient documentation

## 2017-05-19 ENCOUNTER — Ambulatory Visit
Admission: RE | Admit: 2017-05-19 | Discharge: 2017-05-19 | Disposition: A | Discharge status: Home / Self Care | Payer: Medicare Other | Source: Ambulatory Visit | Attending: Internal Medicine | Admitting: Internal Medicine

## 2017-05-19 DIAGNOSIS — C8591 Non-Hodgkin lymphoma, unspecified, lymph nodes of head, face, and neck: Secondary | ICD-10-CM | POA: Diagnosis not present

## 2017-05-19 DIAGNOSIS — E041 Nontoxic single thyroid nodule: Secondary | ICD-10-CM | POA: Diagnosis not present

## 2017-05-19 DIAGNOSIS — C8299 Follicular lymphoma, unspecified, extranodal and solid organ sites: Secondary | ICD-10-CM | POA: Insufficient documentation

## 2017-05-19 LAB — GLUCOSE, CAPILLARY: Glucose-Capillary: 105 mg/dL — ABNORMAL HIGH (ref 65–99)

## 2017-05-19 MED ORDER — FLUDEOXYGLUCOSE F - 18 (FDG) INJECTION
13.0000 | Freq: Once | INTRAVENOUS | Status: AC | PRN
  Administered 2017-05-19: 12.83 via INTRAVENOUS

## 2017-05-21 ENCOUNTER — Inpatient Hospital Stay: Payer: Medicare Other

## 2017-05-21 ENCOUNTER — Inpatient Hospital Stay: Payer: Medicare Other | Attending: Internal Medicine | Admitting: Internal Medicine

## 2017-05-21 VITALS — BP 132/84 | HR 88 | Temp 98.1°F | Resp 16 | Wt 161.6 lb

## 2017-05-21 DIAGNOSIS — Z79899 Other long term (current) drug therapy: Secondary | ICD-10-CM | POA: Diagnosis not present

## 2017-05-21 DIAGNOSIS — G20 Parkinson's disease: Secondary | ICD-10-CM | POA: Diagnosis not present

## 2017-05-21 DIAGNOSIS — Z9225 Personal history of immunosupression therapy: Secondary | ICD-10-CM | POA: Insufficient documentation

## 2017-05-21 DIAGNOSIS — Z8572 Personal history of non-Hodgkin lymphomas: Secondary | ICD-10-CM | POA: Insufficient documentation

## 2017-05-21 DIAGNOSIS — G620 Drug-induced polyneuropathy: Secondary | ICD-10-CM | POA: Diagnosis not present

## 2017-05-21 DIAGNOSIS — M25551 Pain in right hip: Secondary | ICD-10-CM | POA: Diagnosis not present

## 2017-05-21 DIAGNOSIS — Z87891 Personal history of nicotine dependence: Secondary | ICD-10-CM | POA: Insufficient documentation

## 2017-05-21 DIAGNOSIS — G8929 Other chronic pain: Secondary | ICD-10-CM | POA: Diagnosis not present

## 2017-05-21 DIAGNOSIS — C8299 Follicular lymphoma, unspecified, extranodal and solid organ sites: Secondary | ICD-10-CM

## 2017-05-21 DIAGNOSIS — E041 Nontoxic single thyroid nodule: Secondary | ICD-10-CM | POA: Diagnosis not present

## 2017-05-21 DIAGNOSIS — Z885 Allergy status to narcotic agent status: Secondary | ICD-10-CM | POA: Diagnosis not present

## 2017-05-21 DIAGNOSIS — M545 Low back pain: Secondary | ICD-10-CM | POA: Insufficient documentation

## 2017-05-21 LAB — CBC WITH DIFFERENTIAL/PLATELET
Basophils Absolute: 0.1 10*3/uL (ref 0–0.1)
Basophils Relative: 1 %
Eosinophils Absolute: 0.1 10*3/uL (ref 0–0.7)
Eosinophils Relative: 2 %
HCT: 42.2 % (ref 35.0–47.0)
Hemoglobin: 14 g/dL (ref 12.0–16.0)
Lymphocytes Relative: 27 %
Lymphs Abs: 1.9 10*3/uL (ref 1.0–3.6)
MCH: 29.5 pg (ref 26.0–34.0)
MCHC: 33.2 g/dL (ref 32.0–36.0)
MCV: 88.8 fL (ref 80.0–100.0)
Monocytes Absolute: 0.6 10*3/uL (ref 0.2–0.9)
Monocytes Relative: 9 %
Neutro Abs: 4.2 10*3/uL (ref 1.4–6.5)
Neutrophils Relative %: 61 %
Platelets: 189 10*3/uL (ref 150–440)
RBC: 4.75 MIL/uL (ref 3.80–5.20)
RDW: 13.6 % (ref 11.5–14.5)
WBC: 6.9 10*3/uL (ref 3.6–11.0)

## 2017-05-21 LAB — COMPREHENSIVE METABOLIC PANEL
ALT: <5 U/L — ABNORMAL LOW (ref 14–54)
AST: 20 U/L (ref 15–41)
Albumin: 3.8 g/dL (ref 3.5–5.0)
Alkaline Phosphatase: 88 U/L (ref 38–126)
Anion gap: 7 (ref 5–15)
BUN: 28 mg/dL — ABNORMAL HIGH (ref 6–20)
CO2: 27 mmol/L (ref 22–32)
Calcium: 9.3 mg/dL (ref 8.9–10.3)
Chloride: 107 mmol/L (ref 101–111)
Creatinine, Ser: 0.69 mg/dL (ref 0.44–1.00)
GFR calc Af Amer: >60 mL/min (ref >60)
GFR calc non Af Amer: >60 mL/min (ref >60)
Glucose, Bld: 85 mg/dL (ref 65–99)
Potassium: 4.1 mmol/L (ref 3.5–5.1)
Sodium: 141 mmol/L (ref 135–145)
Total Bilirubin: 0.8 mg/dL (ref 0.3–1.2)
Total Protein: 6.7 g/dL (ref 6.5–8.1)

## 2017-05-21 LAB — LACTATE DEHYDROGENASE: LDH: 161 U/L (ref 98–192)

## 2017-05-21 NOTE — Assessment & Plan Note (Addendum)
#   Low-grade follicular lymphoma; involving the spleen. Rituxan maintenance last December 2015. Currently on surveillance.   # Clinically no evidence of recurrence. PET scan 2018 October 4th- NED. Continue surveillance. CBC normal. CMP pending.  # Left thyroid nodule status post previous biopsy- followed by Dr.McQueen.   # s/p back surgery/ Chronic pain right hip low back/sciatica. Not any worse; do not suspect related to her lymphoma especially as the PET scan is negative.  #  We will follow up in 6 months/labs/  M.D. visit.   # I reviewed the blood work- with the patient in detail; also reviewed the imaging independently [as summarized above]; and with the patient in detail.

## 2017-05-21 NOTE — Progress Notes (Signed)
[place] OFFICE PROGRESS NOTE  Patient Care Team: Cletis Athens, MD as PCP - General (Cardiology)  Cancer Staging No matching staging information was found for the patient.   Oncology History   1. Superficial Protidectomy (right side) December 17/2013.  Consistent with low-grade CD10 and [id number] to follicular lymphoma grade 1-2.  Is positive for CD19 and cough of light chain.bone marrow aspiration and biopsy is negative .  PET scan was positive for some increased uptake in the spleen; Stage IVA disease PET scan is consistent with bilateral neck lymph node (December, 2013) 2. Weekly rituxan has been started.  August 24, 2012 3. Patient finished the last dose of Rituxan on September 14 2012  #  maintenance rituximab since April of 2014 patient has finished rituximabin December of 2015  # PET MARCH 2017 -Splenic recurrence; CT images stable. 4th October 2017- PET - NED.   # chronic back pain/PN/ Parkinsons [Dr.Potter]     Follicular lymphoma of extranodal and solid organ sites [facility])      INTERVAL HISTORY:  Sarah Donaldson 66 y.o.  female pleasant patient above history of Low-grade non-Hodgkin's lymphoma is here for follow-up/ review the results of the PET scan.   Patient continues to have chronic back pain this is not changing. She had previous surgery. She denies any radiation of the legs.  Patient has lost some weight since the last visit. However this is intentional. Denies any night sweats. Denies any fevers. Denies any lumps or bumps.Marland Kitchen    REVIEW OF SYSTEMS:  A complete 10 point review of system is done which is negative except mentioned above/history of present illness.   PAST MEDICAL HISTORY :  Past Medical History:  Diagnosis Date  . Cancer ([place])    Non-hodgkin's lymphoma (in remission)  . Parkinson disease ([place])     PAST SURGICAL HISTORY :   Past Surgical History:  Procedure Laterality Date  . BUNIONECTOMY    . RETINAL DETACHMENT SURGERY       FAMILY HISTORY :  No family history on file.  SOCIAL HISTORY:   Social History  Substance Use Topics  . Smoking status: Former Research scientist (life sciences)  . Smokeless tobacco: Never Used  . Alcohol use 0.0 oz/week    ALLERGIES:  is allergic to codeine sulfate.  MEDICATIONS:  Current Outpatient Prescriptions  Medication Sig Dispense Refill  . carbidopa-levodopa (SINEMET IR) 25-100 MG per tablet TAKE 1.5 tablets BY MOUTH every 4 hrs  0  . clonazePAM (KLONOPIN) 1 MG tablet Take 1 mg by mouth daily.     . furosemide (LASIX) 40 MG tablet Take 40 mg by mouth as needed for fluid. Twice a week    . omeprazole (PRILOSEC) 20 MG capsule Take 20 mg by mouth daily.  3  . pramipexole (MIRAPEX) 1.5 MG tablet Take 1.5 mg by mouth 3 (three) times daily.  5  . selegiline (ELDEPRYL) 5 MG capsule TAKE 1 CAPSULE BY MOUTH WITH BREAKFAST AND LUNCH.  1  . traZODone (DESYREL) 50 MG tablet TAKE 1-2 TABLETS NIGHTLY AS NEEDED FOR SLEEP.  1  . meloxicam (MOBIC) 7.5 MG tablet Take 7.5 mg by mouth daily. 1 or 2 tablets daily     No current facility-administered medications for this visit.     PHYSICAL EXAMINATION: ECOG PERFORMANCE STATUS: 0 - Asymptomatic  BP 132/84   Pulse 88   Temp 98.1 F (36.7 C) (Tympanic)   Resp 16   Wt 161 lb 9.6 oz (73.3 kg)   BMI  25.69 kg/m   Filed Weights   05/21/17 1428  Weight: 161 lb 9.6 oz (73.3 kg)    GENERAL: Well-nourished well-developed; Alert, no distress and comfortable.   With her husband.  EYES: no pallor or icterus OROPHARYNX: no thrush or ulceration; good dentition  NECK: supple, no masses felt LYMPH:  no palpable lymphadenopathy in the cervical, axillary or inguinal regions LUNGS: clear to auscultation and  No wheeze or crackles HEART/CVS: regular rate & rhythm and no murmurs; No lower extremity edema ABDOMEN:abdomen soft, non-tender and normal bowel sounds Musculoskeletal:no cyanosis of digits and no clubbing  PSYCH: alert & oriented x 3 with fluent speech NEURO:  no focal motor/sensory deficits SKIN:  no rashes or significant lesions  LABORATORY DATA:  I have reviewed the data as listed    Component Value Date/Time   NA 139 11/13/2016 1410   NA 139 12/03/2014 1038   K 3.8 11/13/2016 1410   K 3.6 12/03/2014 1038   CL 108 11/13/2016 1410   CL 106 12/03/2014 1038   CO2 25 11/13/2016 1410   CO2 28 12/03/2014 1038   GLUCOSE 114 (H) 11/13/2016 1410   GLUCOSE 94 12/03/2014 1038   BUN 25 (H) 11/13/2016 1410   BUN 21 (H) 12/03/2014 1038   CREATININE 0.54 11/13/2016 1410   CREATININE 0.65 12/03/2014 1038   CALCIUM 9.0 11/13/2016 1410   CALCIUM 9.1 12/03/2014 1038   PROT 6.5 11/13/2016 1410   PROT 6.9 12/03/2014 1038   ALBUMIN 3.9 11/13/2016 1410   ALBUMIN 4.2 12/03/2014 1038   AST 16 11/13/2016 1410   AST 19 12/03/2014 1038   ALT <5 (L) 11/13/2016 1410   ALT < 5 (L) 12/03/2014 1038   ALKPHOS 93 11/13/2016 1410   ALKPHOS 89 12/03/2014 1038   BILITOT 0.6 11/13/2016 1410   BILITOT 0.8 12/03/2014 1038   GFRNONAA >60 11/13/2016 1410   GFRNONAA >60 12/03/2014 1038   GFRAA >60 11/13/2016 1410   GFRAA >60 12/03/2014 1038    No results found for: SPEP, UPEP  Lab Results  Component Value Date   WBC 6.9 05/21/2017   NEUTROABS 4.2 05/21/2017   HGB 14.0 05/21/2017   HCT 42.2 05/21/2017   MCV 88.8 05/21/2017   PLT 189 05/21/2017      Chemistry      Component Value Date/Time   NA 139 11/13/2016 1410   NA 139 12/03/2014 1038   K 3.8 11/13/2016 1410   K 3.6 12/03/2014 1038   CL 108 11/13/2016 1410   CL 106 12/03/2014 1038   CO2 25 11/13/2016 1410   CO2 28 12/03/2014 1038   BUN 25 (H) 11/13/2016 1410   BUN 21 (H) 12/03/2014 1038   CREATININE 0.54 11/13/2016 1410   CREATININE 0.65 12/03/2014 1038      Component Value Date/Time   CALCIUM 9.0 11/13/2016 1410   CALCIUM 9.1 12/03/2014 1038   ALKPHOS 93 11/13/2016 1410   ALKPHOS 89 12/03/2014 1038   AST 16 11/13/2016 1410   AST 19 12/03/2014 1038   ALT <5 (L) 11/13/2016 1410   ALT <  5 (L) 12/03/2014 1038   BILITOT 0.6 11/13/2016 1410   BILITOT 0.8 12/03/2014 1038     IMPRESSION: 1. No evidence for residual or recurrent hypermetabolic tumor. 2. Similar appearance of hypermetabolic left thyroid nodule.   Electronically Signed   By: Kerby Moors M.D.   On: 05/19/2017 12:03   RADIOGRAPHIC STUDIES: I have personally reviewed the radiological images as listed and agreed with the  findings in the report. No results found.   ASSESSMENT & PLAN:  Follicular lymphoma of extranodal and solid organ sites [facility]) # Low-grade follicular lymphoma; involving the spleen. Rituxan maintenance last December 2015. Currently on surveillance.   # Clinically no evidence of recurrence. PET scan 2018 October 4th- NED. Continue surveillance. CBC normal. CMP pending.  # Left thyroid nodule status post previous biopsy- followed by Dr.McQueen.   # s/p back surgery/ Chronic pain right hip low back/sciatica. Not any worse; do not suspect related to her lymphoma especially as the PET scan is negative.  #  We will follow up in 6 months/labs/  M.D. visit.   # I reviewed the blood work- with the patient in detail; also reviewed the imaging independently [as summarized above]; and with the patient in detail.   # 25 minutes face-to-face with the patient discussing the above plan of care; more than 50% of time spent on prognosis/ natural history; counseling and coordination.  Orders Placed This Encounter  Procedures  . CBC with Differential/Platelet    Standing Status:   Future    Standing Expiration Date:   05/21/2018  . Comprehensive metabolic panel    Standing Status:   Future    Standing Expiration Date:   05/21/2018  . Lactate dehydrogenase    Standing Status:   Future    Standing Expiration Date:   05/21/2018   All questions were answered. The patient knows to call the clinic with any problems, questions or concerns.      Cammie Sickle, MD 05/21/2017 2:49 PM

## 2017-06-10 DIAGNOSIS — G8929 Other chronic pain: Secondary | ICD-10-CM | POA: Diagnosis not present

## 2017-06-10 DIAGNOSIS — M5416 Radiculopathy, lumbar region: Secondary | ICD-10-CM | POA: Diagnosis not present

## 2017-06-10 DIAGNOSIS — M5441 Lumbago with sciatica, right side: Secondary | ICD-10-CM | POA: Diagnosis not present

## 2017-06-10 DIAGNOSIS — M47816 Spondylosis without myelopathy or radiculopathy, lumbar region: Secondary | ICD-10-CM | POA: Diagnosis not present

## 2017-06-11 DIAGNOSIS — Z23 Encounter for immunization: Secondary | ICD-10-CM | POA: Diagnosis not present

## 2017-06-18 ENCOUNTER — Other Ambulatory Visit: Payer: Self-pay | Admitting: Neurosurgery

## 2017-06-18 DIAGNOSIS — M5416 Radiculopathy, lumbar region: Secondary | ICD-10-CM

## 2017-06-28 ENCOUNTER — Ambulatory Visit
Admission: RE | Admit: 2017-06-28 | Discharge: 2017-06-28 | Disposition: A | Discharge status: Home / Self Care | Payer: Medicare Other | Source: Ambulatory Visit | Attending: Neurosurgery | Admitting: Neurosurgery

## 2017-06-28 DIAGNOSIS — M5416 Radiculopathy, lumbar region: Secondary | ICD-10-CM | POA: Diagnosis present

## 2017-06-28 DIAGNOSIS — M5116 Intervertebral disc disorders with radiculopathy, lumbar region: Secondary | ICD-10-CM | POA: Diagnosis not present

## 2017-06-28 DIAGNOSIS — G8929 Other chronic pain: Secondary | ICD-10-CM | POA: Diagnosis not present

## 2017-06-28 DIAGNOSIS — M48061 Spinal stenosis, lumbar region without neurogenic claudication: Secondary | ICD-10-CM | POA: Diagnosis not present

## 2017-06-28 DIAGNOSIS — M5441 Lumbago with sciatica, right side: Secondary | ICD-10-CM | POA: Diagnosis present

## 2017-06-29 DIAGNOSIS — M5416 Radiculopathy, lumbar region: Secondary | ICD-10-CM | POA: Diagnosis not present

## 2017-07-12 DIAGNOSIS — K219 Gastro-esophageal reflux disease without esophagitis: Secondary | ICD-10-CM | POA: Diagnosis not present

## 2017-07-12 DIAGNOSIS — M25562 Pain in left knee: Secondary | ICD-10-CM | POA: Diagnosis not present

## 2017-07-12 DIAGNOSIS — R200 Anesthesia of skin: Secondary | ICD-10-CM | POA: Diagnosis not present

## 2017-07-12 DIAGNOSIS — G8929 Other chronic pain: Secondary | ICD-10-CM | POA: Diagnosis not present

## 2017-07-12 DIAGNOSIS — C8190 Hodgkin lymphoma, unspecified, unspecified site: Secondary | ICD-10-CM | POA: Diagnosis not present

## 2017-07-12 DIAGNOSIS — M5441 Lumbago with sciatica, right side: Secondary | ICD-10-CM | POA: Diagnosis not present

## 2017-07-12 DIAGNOSIS — G20 Parkinson's disease: Secondary | ICD-10-CM | POA: Diagnosis not present

## 2017-07-12 DIAGNOSIS — G479 Sleep disorder, unspecified: Secondary | ICD-10-CM | POA: Insufficient documentation

## 2017-07-12 DIAGNOSIS — E669 Obesity, unspecified: Secondary | ICD-10-CM | POA: Diagnosis not present

## 2017-07-12 DIAGNOSIS — R202 Paresthesia of skin: Secondary | ICD-10-CM

## 2017-07-14 ENCOUNTER — Ambulatory Visit
Payer: Medicare Other | Attending: Student in an Organized Health Care Education/Training Program | Admitting: Student in an Organized Health Care Education/Training Program

## 2017-07-14 ENCOUNTER — Other Ambulatory Visit: Payer: Self-pay

## 2017-07-14 ENCOUNTER — Encounter: Payer: Self-pay | Admitting: Student in an Organized Health Care Education/Training Program

## 2017-07-14 VITALS — BP 114/79 | HR 86 | Temp 97.6°F | Resp 18 | Ht 66.0 in | Wt 160.0 lb

## 2017-07-14 DIAGNOSIS — M4726 Other spondylosis with radiculopathy, lumbar region: Secondary | ICD-10-CM | POA: Diagnosis not present

## 2017-07-14 DIAGNOSIS — Z6825 Body mass index (BMI) 25.0-25.9, adult: Secondary | ICD-10-CM | POA: Diagnosis not present

## 2017-07-14 DIAGNOSIS — F329 Major depressive disorder, single episode, unspecified: Secondary | ICD-10-CM | POA: Diagnosis not present

## 2017-07-14 DIAGNOSIS — Z87891 Personal history of nicotine dependence: Secondary | ICD-10-CM | POA: Insufficient documentation

## 2017-07-14 DIAGNOSIS — Z885 Allergy status to narcotic agent status: Secondary | ICD-10-CM | POA: Diagnosis not present

## 2017-07-14 DIAGNOSIS — G20 Parkinson's disease: Secondary | ICD-10-CM | POA: Diagnosis not present

## 2017-07-14 DIAGNOSIS — G4752 REM sleep behavior disorder: Secondary | ICD-10-CM | POA: Diagnosis not present

## 2017-07-14 DIAGNOSIS — M5116 Intervertebral disc disorders with radiculopathy, lumbar region: Secondary | ICD-10-CM | POA: Insufficient documentation

## 2017-07-14 DIAGNOSIS — M5416 Radiculopathy, lumbar region: Secondary | ICD-10-CM | POA: Diagnosis not present

## 2017-07-14 DIAGNOSIS — Z981 Arthrodesis status: Secondary | ICD-10-CM

## 2017-07-14 DIAGNOSIS — E7800 Pure hypercholesterolemia, unspecified: Secondary | ICD-10-CM | POA: Insufficient documentation

## 2017-07-14 DIAGNOSIS — E669 Obesity, unspecified: Secondary | ICD-10-CM | POA: Insufficient documentation

## 2017-07-14 DIAGNOSIS — F32A Depression, unspecified: Secondary | ICD-10-CM

## 2017-07-14 DIAGNOSIS — G8929 Other chronic pain: Secondary | ICD-10-CM | POA: Insufficient documentation

## 2017-07-14 DIAGNOSIS — M5136 Other intervertebral disc degeneration, lumbar region: Secondary | ICD-10-CM

## 2017-07-14 DIAGNOSIS — Z9221 Personal history of antineoplastic chemotherapy: Secondary | ICD-10-CM | POA: Insufficient documentation

## 2017-07-14 DIAGNOSIS — Z79899 Other long term (current) drug therapy: Secondary | ICD-10-CM | POA: Insufficient documentation

## 2017-07-14 DIAGNOSIS — F419 Anxiety disorder, unspecified: Secondary | ICD-10-CM | POA: Insufficient documentation

## 2017-07-14 NOTE — Progress Notes (Signed)
Safety precautions to be maintained throughout the outpatient stay will include: orient to surroundings, keep bed in low position, maintain call bell within reach at all times, provide assistance with transfer out of bed and ambulation.  

## 2017-07-14 NOTE — Patient Instructions (Addendum)
1.  Review spinal cord stimulator resources that I have provided you.  Please bring questions next time. 2.  Scheduled for lumbar epidural steroid injection Epidural Steroid Injection Patient Information  Description: The epidural space surrounds the nerves as they exit the spinal cord.  In some patients, the nerves can be compressed and inflamed by a bulging disc or a tight spinal canal (spinal stenosis).  By injecting steroids into the epidural space, we can bring irritated nerves into direct contact with a potentially helpful medication.  These steroids act directly on the irritated nerves and can reduce swelling and inflammation which often leads to decreased pain.  Epidural steroids may be injected anywhere along the spine and from the neck to the low back depending upon the location of your pain.   After numbing the skin with local anesthetic (like Novocaine), a small needle is passed into the epidural space slowly.  You may experience a sensation of pressure while this is being done.  The entire block usually last less than 10 minutes.  Conditions which may be treated by epidural steroids:   Low back and leg pain  Neck and arm pain  Spinal stenosis  Post-laminectomy syndrome  Herpes zoster (shingles) pain  Pain from compression fractures  Preparation for the injection:  1. Do not eat any solid food or dairy products within 8 hours of your appointment.  2. You may drink clear liquids up to 3 hours before appointment.  Clear liquids include water, black coffee, juice or soda.  No milk or cream please. 3. You may take your regular medication, including pain medications, with a sip of water before your appointment  Diabetics should hold regular insulin (if taken separately) and take 1/2 normal NPH dos the morning of the procedure.  Carry some sugar containing items with you to your appointment. 4. A driver must accompany you and be prepared to drive you home after your procedure.   5. Bring all your current medications with your. 6. An IV may be inserted and sedation may be given at the discretion of the physician.   7. A blood pressure cuff, EKG and other monitors will often be applied during the procedure.  Some patients may need to have extra oxygen administered for a short period. 8. You will be asked to provide medical information, including your allergies, prior to the procedure.  We must know immediately if you are taking blood thinners (like Coumadin/Warfarin)  Or if you are allergic to IV iodine contrast (dye). We must know if you could possible be pregnant.  Possible side-effects:  Bleeding from needle site  Infection (rare, may require surgery)  Nerve injury (rare)  Numbness & tingling (temporary)  Difficulty urinating (rare, temporary)  Spinal headache ( a headache worse with upright posture)  Light -headedness (temporary)  Pain at injection site (several days)  Decreased blood pressure (temporary)  Weakness in arm/leg (temporary)  Pressure sensation in back/neck (temporary)  Call if you experience:  Fever/chills associated with headache or increased back/neck pain.  Headache worsened by an upright position.  New onset weakness or numbness of an extremity below the injection site  Hives or difficulty breathing (go to the emergency room)  Inflammation or drainage at the infection site  Severe back/neck pain  Any new symptoms which are concerning to you  Please note:  Although the local anesthetic injected can often make your back or neck feel good for several hours after the injection, the pain will likely return.  It  takes 3-7 days for steroids to work in the epidural space.  You may not notice any pain relief for at least that one week.  If effective, we will often do a series of three injections spaced 3-6 weeks apart to maximally decrease your pain.  After the initial series, we generally will wait several months before  considering a repeat injection of the same type.  If you have any questions, please call [phone] [facility]

## 2017-07-14 NOTE — Progress Notes (Signed)
Patient's Name: Sarah Donaldson  MRN: [id number]  Referring Provider: Meade Maw, MD  DOB: [birth year]  PCP: Sarah Athens, MD  DOS: 07/14/2017  Note by: Sarah Santa, MD  Service setting: Ambulatory outpatient  Specialty: Interventional Pain Management  Location: [facility] (AMB) Pain Management Facility  Visit type: Initial Patient Evaluation  Patient type: New Patient   Primary Reason(s) for Visit: Encounter for initial evaluation of one or more chronic problems (new to examiner) potentially causing chronic pain, and posing a threat to normal musculoskeletal function. (Level of risk: High) CC: Back Pain (right, lower)  HPI  [name] is a 66 y.o. year old, female patient, who comes today to see [place] for the first time for an initial evaluation of her chronic pain. She has Anxiety; Back ache; Low back pain with sciatica; Colon polyp; Clinical depression; Difficulty hearing; H/O adenomatous polyp of colon; Hypercholesteremia; Adiposity; Idiopathic Parkinson's disease ([place]); REM sleep behavior disorder; Detached retina; Disordered sleep; Has a tremor; Hallux limitus; Lymphoma ([place]); and Follicular lymphoma of extranodal and solid organ sites [facility]) on their problem list. Today she comes in for evaluation of her Back Pain (right, lower)  Pain Assessment: Location: Right, Lower Back Radiating: right upper leg Onset: More than a month ago Duration: Chronic pain Quality: (twisting) Severity: 8 /10 (self-reported pain score)  Note: Reported level is inconsistent with clinical observations. Clinically the patient looks like a 3/10 A 3/10 is viewed as "Moderate" and described as significantly interfering with activities of daily living (ADL). It becomes difficult to feed, bathe, get dressed, get on and off the toilet or to perform personal hygiene functions. Difficult to get in and out of bed or a chair without assistance. Very distracting. With effort, it can be ignored when deeply involved in activities.        When using our objective Pain Scale, levels between 6 and 10/10 are said to belong in an emergency room, as it progressively worsens from a 6/10, described as severely limiting, requiring emergency care not usually available at an outpatient pain management facility. At a 6/10 level, communication becomes difficult and requires great effort. Assistance to reach the emergency department may be required. Facial flushing and profuse sweating along with potentially dangerous increases in heart rate and blood pressure will be evident. Effect on ADL:   Timing: Constant Modifying factors: medications  Onset and Duration: Present longer than 3 months Cause of pain: Unknown Severity: No change since onset and NAS-11 now: 9/10 Timing: Night Aggravating Factors: Bending, Lifiting and Walking uphill Alleviating Factors: Cold packs, Lying down and Medications Associated Problems: Day-time cramps, Tingling, Vomiting , Weakness, Pain that wakes patient up and Pain that does not allow patient to sleep Quality of Pain: Agonizing, Annoying, Burning, Constant, Cramping, Hot and Nagging Previous Examinations or Tests: MRI scan Previous Treatments: Chiropractic manipulations, Epidural steroid injections and Narcotic medications  The patient comes into the clinics today for the first time for a chronic pain management evaluation.  66 year old female with a history of Parkinson's disease, follicular lymphoma status post chemotherapy, low back pain that radiates into bilateral legs, right greater than left status post L1/L2 interbody fusion in November 2017 status post epidural steroid injections and sacroiliac joint injection which have not been very effective for her chronic radicular pain.  Patient is a poor historian.  She has memory recall issues and cognitive issues secondary to her Parkinson's.  22 of HPI was obtained from the patient's husband.  He states that her last epidural steroid  injection was in  July.  He states that the first injection was fairly helpful and that the second injection was not very helpful.  She describes her pain as sharp, aching, throbbing, shooting.  She states that her pain does radiate into her right thigh and down to her right toe and that she has new onset left-sided radiation to her left knee as well.  Of note, patient was was unable to sit still for an extended period of time.  She was contorting herself in various directions during our 45-minute conversation.  She states that the pain causes her to move around as well as her Parkinson's.  Her medications are below, but patient takes gabapentin 100 mg in the morning and 200 mg nightly.  Increase doses resulted in sedation and feeling "drunk".  She is also on Parkinson medications including carbidopa, levodopa, selegiline.  She also takes Klonopin 2  Mg daily as needed.  Meds   Current Outpatient Medications:  .  Acetaminophen (TYLENOL ARTHRITIS PAIN PO), Take by mouth 2 (two) times daily., Disp: , Rfl:  .  carbidopa-levodopa (SINEMET IR) 25-100 MG per tablet, TAKE 1.5 tablets BY MOUTH every 4 hrs, Disp: , Rfl: 0 .  clonazePAM (KLONOPIN) 1 MG tablet, Take 2 mg by mouth daily. , Disp: , Rfl:  .  furosemide (LASIX) 40 MG tablet, Take 20 mg by mouth as needed for fluid. Twice a week, Disp: , Rfl:  .  gabapentin (NEURONTIN) 100 MG capsule, Take 100 mg by mouth 2 (two) times daily. 1 in a.m., 2 at night, Disp: , Rfl:  .  meloxicam (MOBIC) 7.5 MG tablet, Take 7.5 mg by mouth daily. 1 or 2 tablets daily, Disp: , Rfl:  .  omeprazole (PRILOSEC) 20 MG capsule, Take 20 mg by mouth daily., Disp: , Rfl: 3 .  pramipexole (MIRAPEX) 1.5 MG tablet, Take 1.5 mg by mouth 3 (three) times daily., Disp: , Rfl: 5 .  rosuvastatin (CRESTOR) 10 MG tablet, Take 10 mg by mouth daily., Disp: , Rfl:  .  selegiline (ELDEPRYL) 5 MG capsule, TAKE 1 CAPSULE BY MOUTH WITH BREAKFAST AND LUNCH., Disp: , Rfl: 1 .  traZODone (DESYREL) 50 MG tablet,  TAKE 1-2 TABLETS NIGHTLY AS NEEDED FOR SLEEP., Disp: , Rfl: 1  Imaging Review    Lumbosacral Imaging: Lumbar MR wo contrast:  Results for orders placed during the hospital encounter of 06/28/17  MR LUMBAR SPINE WO CONTRAST   Narrative CLINICAL DATA:  Lumbar radiculopathy. Right leg pain. Lumbar fusion 1 year ago  EXAM: MRI LUMBAR SPINE WITHOUT CONTRAST  TECHNIQUE: Multiplanar, multisequence MR imaging of the lumbar spine was performed. No intravenous contrast was administered.  COMPARISON:  Lumbar MRI 05/12/2016  FINDINGS: Segmentation:  Standard  Alignment:  Normal alignment  Vertebrae: Negative for fracture or mass. Asymmetric edema around the disc space on the right at L2-3 has developed since the prior study and appears be due to disc degeneration which is asymmetric on the right.  Conus medullaris and cauda equina: Conus extends to the L1-2 level. Conus and cauda equina appear normal.  Paraspinal and other soft tissues: Negative for mass or fluid collection  Disc levels:  T12-L1:  Mild disc degeneration without stenosis  L1-2: Interval pedicle screw and interbody fusion. Negative for stenosis. Removal of posterior osteophyte on the right. Neural foramina patent  L2-3: Asymmetric disc degeneration on the right with disc space narrowing and bone marrow edema. Right lateral spurring with mild right foraminal encroachment. Mild spinal stenosis.  L3-4: Disc degeneration with disc bulging and spurring. Mild left foraminal narrowing appears unchanged.  L4-5: Disc degeneration with disc bulging and spurring left greater than right appears unchanged. Mild facet hypertrophy. Mild subarticular stenosis on the left unchanged  L5-S1: Moderate disc degeneration with diffuse endplate spurring. No significant stenosis.  IMPRESSION: Interval pedicle screw and interbody fusion L1-2 without stenosis  Progressive disc degeneration on the right at L2-3 with disc  space narrowing spurring and bone marrow edema. Mild spinal stenosis and mild right foraminal stenosis  Mild left foraminal narrowing L3-4 unchanged.  Mild subarticular stenosis on the left L4-5 due to spurring also unchanged.   Electronically Signed   By: Franchot Gallo M.D.   On: 06/28/2017 14:46     Results for orders placed during the hospital encounter of 04/18/14  MR Lumbar Spine W Wo Contrast   Narrative CLINICAL DATA:  Bilateral leg pain and low back pain for several months. History of non-Hodgkin's lymphoma. Parkinson's disease. No spinal surgery. LUMBAGO  EXAM: MRI LUMBAR SPINE WITHOUT AND WITH CONTRAST  TECHNIQUE: Multiplanar and multiecho pulse sequences of the lumbar spine were obtained without and with intravenous contrast.  CONTRAST:  13m MULTIHANCE GADOBENATE DIMEGLUMINE 529 MG/ML IV SOLN  [facility]  [address]  Results:  BUN 20 mg/dL,  Creatinine 0.8 mg/dL.  COMPARISON:  PET-CT 02/01/2014.  Radiographs 07/27/2013.  FINDINGS: Segmentation: The numbering convention used for this exam termed L5-S1 as the last intervertebral disc space. Five lumbar type vertebral bodies identified on prior radiographs.  Alignment: There is no spondylolisthesis. Levoconvex curve of the lumbar spine is present with the apex at L3. Curvature is mild.  Vertebrae: Vertebral body height preserved. Bone marrow signal shows heterogenous marrow. This is a nonspecific finding most commonly associated with obesity, anemia, cigarette smoking or chronic disease. Mild degenerative endplate changes at LK5-L9 mild degenerative endplate changes. No destructive osseous lesions.  Conus medullaris: Normal termination at L1.  Paraspinal tissues: Within normal limits. No visible retroperitoneal adenopathy.  Disc levels:  L1-L2: Disc desiccation. Shallow broad-based disc bulging. There is a low LEFT foraminal and  extra foraminal zone disc protrusion without resulting stenosis. This could irritate the exited LEFT L1 nerve.  L2-L3: Disc desiccation and broad-based disc bulging. Minimal central stenosis. Foramina patent.  L3-L4: Mild disc desiccation and loss of height. Asymmetric right-sided ligamentum flavum redundancy. Central canal and lateral recesses are patent. The foramina also appear adequately patent.  L4-L5: RIGHT eccentric disc bulging. LEFT-greater-than-RIGHT facet arthrosis. Mild LEFT facet arthritis. Central canal and lateral recesses appear adequately patent. There is mild LEFT foraminal stenosis associated with asymmetric left-sided disc space collapse, endplate spurring and facet arthrosis.  L5-S1: Disc desiccation and degeneration. Central canal and lateral recesses patent. Broad-based posterior disc bulging and shallow endplate osteophytes. Mild RIGHT foraminal stenosis.  After contrast administration, there is no pathologic enhancement in the lumbar spine.  IMPRESSION: Mild multilevel lumbar spondylosis, expected for age. Mild LEFT L4-L5 facet arthritis.   Electronically Signed   By: GDereck LigasM.D.   On: 04/18/2014 14:40     Results for orders placed during the hospital encounter of 06/09/16  CT LUMBAR SPINE WO CONTRAST   Narrative CLINICAL DATA:  Evaluation for low back pain with extension into right anterior hip in into right lower extremity. History of lymphoma.  EXAM: CT LUMBAR SPINE WITHOUT CONTRAST  TECHNIQUE: Multidetector CT imaging of the lumbar spine was performed without intravenous contrast  administration. Multiplanar CT image reconstructions were also generated.  COMPARISON:  None available.  FINDINGS: Segmentation: Normal segmentation. Lowest well-formed disc is labeled the L5-S1 level.  Alignment: Levoscoliosis with apex at L1-2. Vertebral bodies otherwise normally aligned with preservation of the normal lumbar lordosis. No  listhesis.  Vertebrae: Vertebral body heights maintained. No evidence for acute or chronic fracture. Advanced reactive endplate changes present about the L1-2 interspace. Degenerative intervertebral disc space narrowing with disc desiccation throughout the entire lumbar spine, most prevalent at L1-2 as well. No worrisome osseous lesions. Visualized sacrum intact. SI joints symmetric and within normal limits bilaterally.  Paraspinal and other soft tissues: Paraspinous soft tissues are within normal limits. Scattered aorto bi-iliac atherosclerotic disease noted. Visualized visceral structures are unremarkable.  Disc levels:  T12-L1: Mild diffuse disc bulge with disc desiccation. No stenosis.  L1-2: Diffuse degenerative disc bulge with intervertebral disc space narrowing, disc desiccation, and prominent reactive endplate changes. Disc bulging these eccentric to the right. No focal disc protrusion. Moderate bilateral facet arthrosis, slightly worse on the right. Mild right lateral recess stenosis related to endplate osteophytic spurring and bony overgrowth at the right L1-2 facet. No significant canal narrowing. Moderate to severe right foraminal stenosis (series 7, image 40).  L2-3: Diffuse degenerative disc bulge, eccentric to the left, with disc desiccation. Bilateral facet hypertrophy. No focal disc herniation. No significant canal stenosis. No significant foraminal encroachment.  L3-4: Diffuse degenerative disc bulge with disc desiccation and intervertebral disc space narrowing. Bulging disc flattens the ventral thecal sac. Superimposed mild facet hypertrophy. No significant canal stenosis. Mild left foraminal narrowing related to disc bulge and facet disease.  L4-5: Mild diffuse disc bulge with disc desiccation and intervertebral disc space narrowing. Superimposed mild bilateral facet arthrosis, slightly worse on the left. Resultant mild canal and left lateral recess  stenosis. Mild to moderate bilateral foraminal narrowing related to disc bulge and facet disease, slightly worse on the left.  L5-S1: Diffuse degenerative disc bulge with disc desiccation and intervertebral disc space narrowing. Moderate bilateral facet arthrosis. No significant canal stenosis. Moderate bilateral foraminal narrowing related to disc osteophyte and bony overgrowth at the bilateral L5-S1 facets.  IMPRESSION: 1. Advanced degenerative disc disease at L1-2 with reactive endplate changes, with superimposed facet arthropathy. Resultant moderate to severe right foraminal stenosis, potentially affecting the right L1 nerve root. 2. Moderate bilateral foraminal stenosis at L5-S1 related to disc osteophyte and osseous overgrowth at the bilateral L5-S1 facets. 3. Additional more mild multilevel degenerative spondylolysis and facet arthrosis at L2-3, L3-4, and L4-5 as above. 4. Levoscoliosis with apex at L1-2.   Electronically Signed   By: Jeannine Boga M.D.   On: 06/09/2016 16:34     Lumbar DG (Complete) 4+V:  Results for orders placed during the hospital encounter of 03/26/16  DG Lumbar Spine Complete   Narrative CLINICAL DATA:  Right-sided sciatic symptoms, low back pain radiating into the right hip and right leg for the past year.  EXAM: LUMBAR SPINE - COMPLETE 4+ VIEW  COMPARISON:  Lumbar spine MRI dated April 18, 2014 and lumbar spine series of July 27, 2013.  FINDINGS: The bones are subjectively osteopenic. There is mild levocurvature centered at L1-2 that is more conspicuous today. The pedicles and transverse processes are intact. The vertebral bodies are preserved in height. There is moderate degenerative disc space narrowing at T12-L1 and L1-L2. There is facet joint hypertrophy at L4-5 and L5-S1. The observed portions of the sacrum are normal.  IMPRESSION: There is no compression fracture  nor other acute bony abnormality. There is  degenerative disc space narrowing at T12-L1 and L1-L2. There is facet joint hypertrophy at L4-5 and L5-S1. Mild levocurvature is present centered at L1-2.   Electronically Signed   By: David  Martinique M.D.   On: 03/27/2016 07:54     Foot Imaging: Foot-R DG Complete:  Results for orders placed in visit on 02/19/17  DG Foot Complete Right   Narrative Please see detailed radiograph report in office note.   Foot-L DG Complete:  Results for orders placed in visit on 08/16/14  DG Foot Complete Left   Narrative 3 views of a skeletally mature individual were obtained of the left foot.  Study includes AP, oblique, lateral projections.  Decrease in calcaneal inclination angle with an increase in talar  declination angle consistent with flatfoot deformity. There is no evidence  of acute fracture or stress fracture at this time.    Complexity Note: Imaging results reviewed. Results shared with Ms. Gullick, using Layman's terms.                         ROS  Cardiovascular History: No reported cardiovascular signs or symptoms such as High blood pressure, coronary artery disease, abnormal heart rate or rhythm, heart attack, blood thinner therapy or heart weakness and/or failure Pulmonary or Respiratory History: No reported pulmonary signs or symptoms such as wheezing and difficulty taking a deep full breath (Asthma), difficulty blowing air out (Emphysema), coughing up mucus (Bronchitis), persistent dry cough, or temporary stoppage of breathing during sleep Neurological History: No reported neurological signs or symptoms such as seizures, abnormal skin sensations, urinary and/or fecal incontinence, being born with an abnormal open spine and/or a tethered spinal cord Review of Past Neurological Studies: No results found for this or any previous visit. Psychological-Psychiatric History: No reported psychological or psychiatric signs or symptoms such as difficulty sleeping, anxiety, depression,  delusions or hallucinations (schizophrenial), mood swings (bipolar disorders) or suicidal ideations or attempts Gastrointestinal History: No reported gastrointestinal signs or symptoms such as vomiting or evacuating blood, reflux, heartburn, alternating episodes of diarrhea and constipation, inflamed or scarred liver, or pancreas or irrregular and/or infrequent bowel movements Genitourinary History: No reported renal or genitourinary signs or symptoms such as difficulty voiding or producing urine, peeing blood, non-functioning kidney, kidney stones, difficulty emptying the bladder, difficulty controlling the flow of urine, or chronic kidney disease Hematological History: No reported hematological signs or symptoms such as prolonged bleeding, low or poor functioning platelets, bruising or bleeding easily, hereditary bleeding problems, low energy levels due to low hemoglobin or being anemic Endocrine History: No reported endocrine signs or symptoms such as high or low blood sugar, rapid heart rate due to high thyroid levels, obesity or weight gain due to slow thyroid or thyroid disease Rheumatologic History: No reported rheumatological signs and symptoms such as fatigue, joint pain, tenderness, swelling, redness, heat, stiffness, decreased range of motion, with or without associated rash Musculoskeletal History: Negative for myasthenia gravis, muscular dystrophy, multiple sclerosis or malignant hyperthermia Work History: Retired  Allergies  Ms. Idler is allergic to codeine sulfate.  Laboratory Chemistry  Inflammation Markers (CRP: Acute Phase) (ESR: Chronic Phase) Lab Results  Component Value Date   ESRSEDRATE 4 06/07/2013                 Rheumatology Markers No results found for: RF, ANA, LABURIC, URICUR, LYMEIGGIGMAB, LYMEABIGMQN              Renal  Function Markers Lab Results  Component Value Date   BUN 28 (H) 05/21/2017   CREATININE 0.69 05/21/2017   GFRAA >60 05/21/2017   GFRNONAA  >60 05/21/2017                 Hepatic Function Markers Lab Results  Component Value Date   AST 20 05/21/2017   ALT <5 (L) 05/21/2017   ALBUMIN 3.8 05/21/2017   ALKPHOS 88 05/21/2017                 Electrolytes Lab Results  Component Value Date   NA 141 05/21/2017   K 4.1 05/21/2017   CL 107 05/21/2017   CALCIUM 9.3 05/21/2017                 Neuropathy Markers No results found for: VITAMINB12, FOLATE, HGBA1C, HIV               Bone Pathology Markers No results found for: VD25OH, VD125OH2TOT, IZ1245YK9, [id number], 25OHVITD1, 25OHVITD2, 25OHVITD3, TESTOFREE, TESTOSTERONE               Coagulation Parameters Lab Results  Component Value Date   PLT 189 05/21/2017                 Cardiovascular Markers Lab Results  Component Value Date   HGB 14.0 05/21/2017   HCT 42.2 05/21/2017                 CA Markers No results found for: CEA, CA125, LABCA2               Note: Lab results reviewed.  [place]  Drug: Ms. Baize  reports that she does not use drugs. Alcohol:  reports that she drinks alcohol. Tobacco:  reports that she has quit smoking. she has never used smokeless tobacco. Medical:  has a past medical history of Cancer ([place]), Parkinson disease ([place]), and Parkinson disease ([place]). Family: family history includes Heart disease in her mother.  Past Surgical History:  Procedure Laterality Date  . BACK SURGERY    . BUNIONECTOMY    . NASAL SEPTUM SURGERY    . RETINAL DETACHMENT SURGERY     Active Ambulatory Problems    Diagnosis Date Noted  . Anxiety 06/06/2012  . Back ache 01/25/2014  . Low back pain with sciatica 02/13/2015  . Colon polyp 06/06/2012  . Clinical depression 06/06/2012  . Difficulty hearing 06/08/2012  . H/O adenomatous polyp of colon 04/06/2014  . Hypercholesteremia 06/06/2012  . Adiposity 01/25/2014  . Idiopathic Parkinson's disease ([place]) 06/06/2012  . REM sleep behavior disorder 01/25/2014  . Detached retina 06/06/2012  . Disordered  sleep 01/25/2014  . Has a tremor 01/25/2014  . Hallux limitus 07/27/2015  . Lymphoma ([place]) 04/30/2016  . Follicular lymphoma of extranodal and solid organ sites [facility]) 05/01/2016   Resolved Ambulatory Problems    Diagnosis Date Noted  . No Resolved Ambulatory Problems   Past Medical History:  Diagnosis Date  . Cancer ([place])   . Parkinson disease ([place])   . Parkinson disease ([place])    Constitutional Exam  General appearance: alert, distracted, slowed mentation and confused  Vitals:   07/14/17 1428  BP: 114/79  Pulse: 86  Resp: 18  Temp: 97.6 F (36.4 C)  TempSrc: Oral  SpO2: 94%  Weight: 160 lb (72.6 kg)  Height: '5\' 6"'  (1.676 m)   BMI Assessment: Estimated body mass index is 25.82 kg/m as calculated from the following:   Height as of  this encounter: '5\' 6"'  (1.676 m).   Weight as of this encounter: 160 lb (72.6 kg).  BMI interpretation table: BMI level Category Range association with higher incidence of chronic pain  <18 kg/m2 Underweight   18.5-24.9 kg/m2 Ideal body weight   25-29.9 kg/m2 Overweight Increased incidence by 20%  30-34.9 kg/m2 Obese (Class I) Increased incidence by 68%  35-39.9 kg/m2 Severe obesity (Class II) Increased incidence by 136%  >40 kg/m2 Extreme obesity (Class III) Increased incidence by 254%   BMI Readings from Last 4 Encounters:  07/14/17 25.82 kg/m  05/21/17 25.69 kg/m  05/19/17 25.28 kg/m  11/13/16 26.93 kg/m   Wt Readings from Last 4 Encounters:  07/14/17 160 lb (72.6 kg)  05/21/17 161 lb 9.6 oz (73.3 kg)  05/19/17 159 lb (72.1 kg)  11/13/16 169 lb 6 oz (76.8 kg)  Psych/Mental status: Alert, oriented x 3 (person, place, & time)       Eyes: PERLA Respiratory: No evidence of acute respiratory distress  Cervical Spine Area Exam  Skin & Axial Inspection: No masses, redness, edema, swelling, or associated skin lesions Alignment: Symmetrical Functional ROM: Unrestricted ROM      Stability: No instability detected Muscle  Tone/Strength: Functionally intact. No obvious neuro-muscular anomalies detected. Sensory (Neurological): Unimpaired Palpation: No palpable anomalies              Upper Extremity (UE) Exam    Side: Right upper extremity  Side: Left upper extremity  Skin & Extremity Inspection: Skin color, temperature, and hair growth are WNL. No peripheral edema or cyanosis. No masses, redness, swelling, asymmetry, or associated skin lesions. No contractures.  Skin & Extremity Inspection: Skin color, temperature, and hair growth are WNL. No peripheral edema or cyanosis. No masses, redness, swelling, asymmetry, or associated skin lesions. No contractures.  Functional ROM: Unrestricted ROM          Functional ROM: Unrestricted ROM          Muscle Tone/Strength: Functionally intact. No obvious neuro-muscular anomalies detected.  Muscle Tone/Strength: Functionally intact. No obvious neuro-muscular anomalies detected.  Sensory (Neurological): Unimpaired          Sensory (Neurological): Unimpaired          Palpation: No palpable anomalies              Palpation: No palpable anomalies              Specialized Test(s): Deferred         Specialized Test(s): Deferred          Thoracic Spine Area Exam  Skin & Axial Inspection: No masses, redness, or swelling Alignment: Symmetrical Functional ROM: Decreased ROM Stability: No instability detected Muscle Tone/Strength: Functionally intact. No obvious neuro-muscular anomalies detected. Sensory (Neurological): Musculoskeletal pain pattern and dermatomal pain referral pattern Muscle strength & Tone: No palpable anomalies  Lumbar Spine Area Exam  Skin & Axial Inspection: Well healed scar from previous spine surgery detected Alignment: Symmetrical Functional ROM: Decreased ROM, bilaterally, lumbar extension specifically Stability: No instability detected Muscle Tone/Strength: Functionally intact. No obvious neuro-muscular anomalies detected. Sensory (Neurological):  Dermatomal pain pattern Palpation: Complains of area being tender to palpation Bilateral Fist Percussion Test Provocative Tests: Lumbar Hyperextension and rotation test: Positive bilaterally for facet joint pain.right greater than left Lumbar Lateral bending test: Positive ipsilateral radicular pain, on the right. Positive for right-sided foraminal stenosis. Patrick's Maneuver: Positive for bilateral S-I arthralgia              Gait & Posture  Assessment  Ambulation: Limited Gait: Antalgic Posture: WNL   Lower Extremity Exam    Side: Right lower extremity  Side: Left lower extremity  Skin & Extremity Inspection: Skin color, temperature, and hair growth are WNL. No peripheral edema or cyanosis. No masses, redness, swelling, asymmetry, or associated skin lesions. No contractures.  Skin & Extremity Inspection: Skin color, temperature, and hair growth are WNL. No peripheral edema or cyanosis. No masses, redness, swelling, asymmetry, or associated skin lesions. No contractures.  Functional ROM: Unrestricted ROM          Functional ROM: Unrestricted ROM          Muscle Tone/Strength: Functionally intact. No obvious neuro-muscular anomalies detected.  Muscle Tone/Strength: Functionally intact. No obvious neuro-muscular anomalies detected.  Sensory (Neurological): Unimpaired  Sensory (Neurological): Unimpaired  Palpation: No palpable anomalies  Palpation: No palpable anomalies   Assessment  Primary Diagnosis & Pertinent Problem List: The primary encounter diagnosis was Lumbar radiculopathy. 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  Visit Diagnosis (New problems to examiner): 1. Lumbar radiculopathy   2. S/P lumbar fusion   3. Lumbar degenerative disc disease   4. 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 Depression, unspecified depression type      66 year old female with a history of Parkinson's disease, follicular lymphoma status post chemotherapy, low back pain that radiates into bilateral legs, right greater than left status post L1/L2 interbody fusion in November 2017 status post epidural steroid injections and sacroiliac joint injection which have not been very effective for her chronic radicular pain.  Patient is a poor historian.  She has memory recall issues and cognitive issues secondary to her Parkinson's.  83 of HPI was obtained from the patient's husband.  He states that her last epidural steroid injection was in July.  He states that the first injection was fairly helpful and that the second injection was not very helpful.  I had an extensive discussion with the patient's about the risks and benefits of spinal cord stimulation.  I reviewed the patient's lumbar MRI with the patient and her husband.  I also used a spine model to review spinal cord stimulator electrodes and the pulse generator.  I reviewed details about workup for the trial which includes thoracic MRI to rule out thoracic canal stenosis, psychological evaluation regarding suitability for SCS trial and implant.    I do have concerns regarding suitability of spinal cord stimulator trial in this patient.  First off the patient has memory issues that during my evaluation were fairly prominent.  She was unable to recall the dates of her previous interventions and she often had alternating stories about her pain symptoms and the associated relief that she has received with previous interventions.  During our conversation, her husband would correct her on many occasions.  Her cognitive status may limit her ability to fully understand the utility and perceived benefits of spinal cord stimulation.  Patient has very poor memory recall.  Furthermore I am concerned about the patient's constant movement which she states is secondary to her Parkinson's.  For me to do the trial  safely, I informed her that she would need to lie prone on our fluoroscopy table for 1-2 hours and that any movement during the trial could increase her risk of nerve injury and spinal cord injury.    At this point I will delay further workup in regards to spinal  cord stimulation until the patient and her caregiver have further explored this potential therapy.  I will provide them  DVDs and informational resources about the various companies that we use for spinal cord stimulation.  I have instructed them to review these resources and to come back with questions at the patient's next visit.  At that point I can answer their questions and discuss with them further whether spinal cord stimulation trial will be a suitable option or not.  If it is an option, we will proceed with thoracic MRI to rule out thoracic canal stenosis and I will also send the patient for psychological evaluation.  Since it has been greater than 6 months since the patient's last epidural steroid injection, I believe repeating this could be helpful. It will also allow me to gauge whether the patient will be able to tolerate the prone position for an extended period of time.  I instructed the patient to take her Klonopin and Parkinson medications prior to her epidural injection to help her remain as still as possible during the epidural.  Plan: -Provided patient and her husband resources and DVDs regarding spinal cord stimulation.  Instructed them to review and come back with questions.  If spinal cord stimulator trial being considered, patient will need thoracic MRI and psychological evaluation. -Concern about the patient's mental status, memory recall, cognitive state as it pertains to an implantable therapy such as spinal cord stimulation. -Concern about patient's continuous movement which may preclude safely doing the trial.  Patient will also need to limit her bending, lifting, twisting while trial is in place which I am also afraid  that she will not be able to do given her Parkinson symptoms. -Schedule for lumbar epidural steroid injection under fluoroscopy -Continue gabapentin as prescribed  Ordered Lab-work, Procedure(s), Referral(s), & Consult(s): Orders Placed This Encounter  Procedures  . Lumbar Epidural Injection   Future: -repeat ESI -Thoracic MRI and Psych eval (if SCS being considered- current concerns about ability to lay prone without moving (even with sedation) during trial. Movement during SCS trial could cause injury to nerves and or spinal cord which was discussed with patient. Also concerned about patient's mental status, memory issues, and generalized confusion)  Provider-requested follow-up: Return for Procedure.  Time Note: Greater than 50% of the 45 minute(s) of face-to-face time spent with Ms. Chillemi, was spent in counseling/coordination of care regarding: the treatment plan, treatment alternatives and the risks and possible complications of proposed treatment.  Future Appointments  Date Time Provider [place]  07/21/2017 10:00 AM Sarah Santa, MD [facility] None  11/19/2017 11:00 AM [facility] None  11/19/2017 11:30 AM Cammie Sickle, MD [facility] None    Primary Care Physician: Sarah Athens, MD Location: [facility] Note by: Sarah Donaldson, M.D, Date: 07/14/2017; Time: 4:13 PM  Patient Instructions  1.  Review spinal cord stimulator resources that I have provided you.  Please bring questions next time. 2.  Scheduled for lumbar epidural steroid injection Epidural Steroid Injection Patient Information  Description: The epidural space surrounds the nerves as they exit the spinal cord.  In some patients, the nerves can be compressed and inflamed by a bulging disc or a tight spinal canal (spinal stenosis).  By injecting steroids into the epidural space, we can bring irritated nerves into direct contact with a potentially helpful  medication.  These steroids act directly on the irritated nerves and can reduce swelling and inflammation which often leads to decreased pain.  Epidural  steroids may be injected anywhere along the spine and from the neck to the low back depending upon the location of your pain.   After numbing the skin with local anesthetic (like Novocaine), a small needle is passed into the epidural space slowly.  You may experience a sensation of pressure while this is being done.  The entire block usually last less than 10 minutes.  Conditions which may be treated by epidural steroids:   Low back and leg pain  Neck and arm pain  Spinal stenosis  Post-laminectomy syndrome  Herpes zoster (shingles) pain  Pain from compression fractures  Preparation for the injection:  1. Do not eat any solid food or dairy products within 8 hours of your appointment.  2. You may drink clear liquids up to 3 hours before appointment.  Clear liquids include water, black coffee, juice or soda.  No milk or cream please. 3. You may take your regular medication, including pain medications, with a sip of water before your appointment  Diabetics should hold regular insulin (if taken separately) and take 1/2 normal NPH dos the morning of the procedure.  Carry some sugar containing items with you to your appointment. 4. A driver must accompany you and be prepared to drive you home after your procedure.  5. Bring all your current medications with your. 6. An IV may be inserted and sedation may be given at the discretion of the physician.   7. A blood pressure cuff, EKG and other monitors will often be applied during the procedure.  Some patients may need to have extra oxygen administered for a short period. 8. You will be asked to provide medical information, including your allergies, prior to the procedure.  We must know immediately if you are taking blood thinners (like Coumadin/Warfarin)  Or if you are allergic to IV iodine  contrast (dye). We must know if you could possible be pregnant.  Possible side-effects:  Bleeding from needle site  Infection (rare, may require surgery)  Nerve injury (rare)  Numbness & tingling (temporary)  Difficulty urinating (rare, temporary)  Spinal headache ( a headache worse with upright posture)  Light -headedness (temporary)  Pain at injection site (several days)  Decreased blood pressure (temporary)  Weakness in arm/leg (temporary)  Pressure sensation in back/neck (temporary)  Call if you experience:  Fever/chills associated with headache or increased back/neck pain.  Headache worsened by an upright position.  New onset weakness or numbness of an extremity below the injection site  Hives or difficulty breathing (go to the emergency room)  Inflammation or drainage at the infection site  Severe back/neck pain  Any new symptoms which are concerning to you  Please note:  Although the local anesthetic injected can often make your back or neck feel good for several hours after the injection, the pain will likely return.  It takes 3-7 days for steroids to work in the epidural space.  You may not notice any pain relief for at least that one week.  If effective, we will often do a series of three injections spaced 3-6 weeks apart to maximally decrease your pain.  After the initial series, we generally will wait several months before considering a repeat injection of the same type.  If you have any questions, please call [phone] [facility]

## 2017-07-19 ENCOUNTER — Ambulatory Visit: Payer: Medicare Other | Admitting: Student in an Organized Health Care Education/Training Program

## 2017-07-21 ENCOUNTER — Ambulatory Visit (HOSPITAL_BASED_OUTPATIENT_CLINIC_OR_DEPARTMENT_OTHER): Payer: Medicare Other | Admitting: Student in an Organized Health Care Education/Training Program

## 2017-07-21 ENCOUNTER — Ambulatory Visit
Admission: RE | Admit: 2017-07-21 | Discharge: 2017-07-21 | Disposition: A | Discharge status: Home / Self Care | Payer: Medicare Other | Source: Ambulatory Visit | Attending: Student in an Organized Health Care Education/Training Program | Admitting: Student in an Organized Health Care Education/Training Program

## 2017-07-21 ENCOUNTER — Encounter: Payer: Self-pay | Admitting: Student in an Organized Health Care Education/Training Program

## 2017-07-21 VITALS — BP 115/82 | HR 81 | Temp 97.7°F | Resp 20 | Ht 66.0 in | Wt 160.0 lb

## 2017-07-21 DIAGNOSIS — M5116 Intervertebral disc disorders with radiculopathy, lumbar region: Secondary | ICD-10-CM | POA: Insufficient documentation

## 2017-07-21 DIAGNOSIS — Z981 Arthrodesis status: Secondary | ICD-10-CM

## 2017-07-21 DIAGNOSIS — M5416 Radiculopathy, lumbar region: Secondary | ICD-10-CM | POA: Diagnosis not present

## 2017-07-21 DIAGNOSIS — M5136 Other intervertebral disc degeneration, lumbar region: Secondary | ICD-10-CM

## 2017-07-21 MED ORDER — DEXAMETHASONE SODIUM PHOSPHATE 10 MG/ML IJ SOLN
10.0000 mg | Freq: Once | INTRAMUSCULAR | Status: AC
  Administered 2017-07-21: 10 mg
  Filled 2017-07-21: qty 1

## 2017-07-21 MED ORDER — IOPAMIDOL (ISOVUE-M 200) INJECTION 41%
10.0000 mL | Freq: Once | INTRAMUSCULAR | Status: AC
  Administered 2017-07-21: 20 mL via EPIDURAL
  Filled 2017-07-21: qty 10

## 2017-07-21 MED ORDER — LIDOCAINE HCL (PF) 1 % IJ SOLN
4.5000 mL | Freq: Once | INTRAMUSCULAR | Status: AC
  Administered 2017-07-21: 5 mL
  Filled 2017-07-21: qty 5

## 2017-07-21 MED ORDER — SODIUM CHLORIDE 0.9% FLUSH
2.0000 mL | Freq: Once | INTRAVENOUS | Status: AC
  Administered 2017-07-21: 10 mL

## 2017-07-21 MED ORDER — ROPIVACAINE HCL 2 MG/ML IJ SOLN
2.0000 mL | Freq: Once | INTRAMUSCULAR | Status: AC
  Administered 2017-07-21: 10 mL via EPIDURAL
  Filled 2017-07-21: qty 10

## 2017-07-21 NOTE — Progress Notes (Signed)
Patient's Name: Sarah Donaldson  MRN: [id number]  Referring Provider: Cletis Athens, MD  DOB: 02-20-1951  PCP: Cletis Athens, MD  DOS: 07/21/2017  Note by: Gillis Santa, MD  Service setting: Ambulatory outpatient  Specialty: Interventional Pain Management  Patient type: Established  Location: [facility] (AMB) Pain Management Facility  Visit type: Interventional Procedure   Primary Reason for Visit: Interventional Pain Management Treatment. CC: Back pain, right>>>left  Procedure:  Anesthesia, Analgesia, Anxiolysis:  Type: Therapeutic Inter-Laminar Epidural Steroid Injection Region: Lumbar Level: L3-4 Level. Laterality: Midline directed towards right         Type: Local Anesthesia Local Anesthetic: Lidocaine 1% Route: Infiltration ([place]/IM) IV Access: Declined Sedation: Meaningful verbal contact was maintained at all times during the procedure  Indication(s): Analgesia and Anxiety   Indications: 1. Lumbar radiculopathy   2. S/P lumbar fusion   3. Lumbar degenerative disc disease    Pain Score: Pre-procedure: 8 /10 Post-procedure: 8 /10  Pre-op Assessment:  Sarah Donaldson is a 66 y.o. (year old), female patient, seen today for interventional treatment. She  has a past surgical history that includes Retinal detachment surgery; Bunionectomy; Nasal septum surgery; and Back surgery. Sarah Donaldson has a current medication list which includes the following prescription(s): acetaminophen, carbidopa-levodopa, clonazepam, furosemide, gabapentin, meloxicam, omeprazole, pramipexole, rosuvastatin, selegiline, and trazodone. Her primarily concern today is the Back Pain (lower bilateral, left is worse )  Initial Vital Signs: There were no vitals taken for this visit. BMI: Estimated body mass index is 25.82 kg/m as calculated from the following:   Height as of this encounter: 5\' 6"  (1.676 m).   Weight as of this encounter: 160 lb (72.6 kg).  Risk Assessment: Allergies: Reviewed. She is allergic to codeine  sulfate.  Allergy Precautions: None required Coagulopathies: Reviewed. None identified.  Blood-thinner therapy: None at this time Active Infection(s): Reviewed. None identified. Sarah Donaldson is afebrile  Site Confirmation: Sarah Donaldson was asked to confirm the procedure and laterality before marking the site Procedure checklist: Completed Consent: Before the procedure and under the influence of no sedative(s), amnesic(s), or anxiolytics, the patient was informed of the treatment options, risks and possible complications. To fulfill our ethical and legal obligations, as recommended by the American Medical Association's Code of Ethics, I have informed the patient of my clinical impression; the nature and purpose of the treatment or procedure; the risks, benefits, and possible complications of the intervention; the alternatives, including doing nothing; the risk(s) and benefit(s) of the alternative treatment(s) or procedure(s); and the risk(s) and benefit(s) of doing nothing. The patient was provided information about the general risks and possible complications associated with the procedure. These may include, but are not limited to: failure to achieve desired goals, infection, bleeding, organ or nerve damage, allergic reactions, paralysis, and death. In addition, the patient was informed of those risks and complications associated to Spine-related procedures, such as failure to decrease pain; infection (i.e.: Meningitis, epidural or intraspinal abscess); bleeding (i.e.: epidural hematoma, subarachnoid hemorrhage, or any other type of intraspinal or peri-dural bleeding); organ or nerve damage (i.e.: Any type of peripheral nerve, nerve root, or spinal cord injury) with subsequent damage to sensory, motor, and/or autonomic systems, resulting in permanent pain, numbness, and/or weakness of one or several areas of the body; allergic reactions; (i.e.: anaphylactic reaction); and/or death. Furthermore, the patient  was informed of those risks and complications associated with the medications. These include, but are not limited to: allergic reactions (i.e.: anaphylactic or anaphylactoid reaction(s)); adrenal axis suppression; blood sugar elevation  that in diabetics may result in ketoacidosis or comma; water retention that in patients with history of congestive heart failure may result in shortness of breath, pulmonary edema, and decompensation with resultant heart failure; weight gain; swelling or edema; medication-induced neural toxicity; particulate matter embolism and blood vessel occlusion with resultant organ, and/or nervous system infarction; and/or aseptic necrosis of one or more joints. Finally, the patient was informed that Medicine is not an exact science; therefore, there is also the possibility of unforeseen or unpredictable risks and/or possible complications that may result in a catastrophic outcome. The patient indicated having understood very clearly. We have given the patient no guarantees and we have made no promises. Enough time was given to the patient to ask questions, all of which were answered to the patient's satisfaction. Sarah Donaldson has indicated that she wanted to continue with the procedure. Attestation: I, the ordering provider, attest that I have discussed with the patient the benefits, risks, side-effects, alternatives, likelihood of achieving goals, and potential problems during recovery for the procedure that I have provided informed consent. Date: 07/21/2017; Time: 10:35 AM  Pre-Procedure Preparation:  Monitoring: As per clinic protocol. Respiration, ETCO2, SpO2, BP, heart rate and rhythm monitor placed and checked for adequate function Safety Precautions: Patient was assessed for positional comfort and pressure points before starting the procedure. Time-out: I initiated and conducted the "Time-out" before starting the procedure, as per protocol. The patient was asked to participate by  confirming the accuracy of the "Time Out" information. Verification of the correct person, site, and procedure were performed and confirmed by me, the nursing staff, and the patient. "Time-out" conducted as per Joint Commission's Universal Protocol (UP.01.01.01). "Time-out" Date & Time: 07/21/2017; 1119 hrs.  Description of Procedure Process:   Position: Prone with head of the table was raised to facilitate breathing. Target Area: The interlaminar space, initially targeting the lower laminar border of the superior vertebral body. Approach: Paramedial approach. Area Prepped: Entire Posterior Lumbar Region Prepping solution: ChloraPrep (2% chlorhexidine gluconate and 70% isopropyl alcohol) Safety Precautions: Aspiration looking for blood return was conducted prior to all injections. At no point did we inject any substances, as a needle was being advanced. No attempts were made at seeking any paresthesias. Safe injection practices and needle disposal techniques used. Medications properly checked for expiration dates. SDV (single dose vial) medications used. Description of the Procedure: Protocol guidelines were followed. The procedure needle was introduced through the skin, ipsilateral to the reported pain, and advanced to the target area. Bone was contacted and the needle walked caudad, until the lamina was cleared. The epidural space was identified using "loss-of-resistance technique" with 2-3 ml of PF-NaCl (0.9% NSS), in a 5cc LOR glass syringe. Vitals:   07/21/17 1033 07/21/17 1116 07/21/17 1122 07/21/17 1129  BP: 109/70 113/84 110/82 115/82  Pulse: 81     Resp: 16 20 (!) 22 20  Temp: 97.7 F (36.5 C)     TempSrc: Oral     SpO2: 99% 98% 97% 96%  Weight: 160 lb (72.6 kg)     Height: 5\' 6"  (1.676 m)       Start Time: 1119 hrs. End Time: 1127 hrs. Materials:  Needle(s) Type: Epidural needle Gauge: 17G Length: 3.5-in Medication(s): We administered ropivacaine (PF) 2 mg/mL (0.2%), sodium  chloride flush, iopamidol, lidocaine (PF), and dexamethasone. Please see chart orders for dosing details. 9 cc solution made of 5 cc of preservative-free saline, 2 cc of 0.2% ropivacaine, 1 cc of Decadron 10 mg/cc. Imaging  Guidance (Spinal):  Type of Imaging Technique: Fluoroscopy Guidance (Spinal) Indication(s): Assistance in needle guidance and placement for procedures requiring needle placement in or near specific anatomical locations not easily accessible without such assistance. Exposure Time: Please see nurses notes. Contrast: Before injecting any contrast, we confirmed that the patient did not have an allergy to iodine, shellfish, or radiological contrast. Once satisfactory needle placement was completed at the desired level, radiological contrast was injected. Contrast injected under live fluoroscopy. No contrast complications. See chart for type and volume of contrast used. Fluoroscopic Guidance: I was personally present during the use of fluoroscopy. "Tunnel Vision Technique" used to obtain the best possible view of the target area. Parallax error corrected before commencing the procedure. "Direction-depth-direction" technique used to introduce the needle under continuous pulsed fluoroscopy. Once target was reached, antero-posterior, oblique, and lateral fluoroscopic projection used confirm needle placement in all planes. Images permanently stored in EMR. Interpretation: I personally interpreted the imaging intraoperatively. Adequate needle placement confirmed in multiple planes. Appropriate spread of contrast into desired area was observed. No evidence of afferent or efferent intravascular uptake. No intrathecal or subarachnoid spread observed. Permanent images saved into the patient's record.  Antibiotic Prophylaxis:  Indication(s): None identified Antibiotic given: None  Post-operative Assessment:  EBL: None Complications: No immediate post-treatment complications observed by team, or  reported by patient. Note: The patient tolerated the entire procedure well. A repeat set of vitals were taken after the procedure and the patient was kept under observation following institutional policy, for this type of procedure. Post-procedural neurological assessment was performed, showing return to baseline, prior to discharge. The patient was provided with post-procedure discharge instructions, including a section on how to identify potential problems. Should any problems arise concerning this procedure, the patient was given instructions to immediately contact us, at any time, without hesitation. In any case, we plan to contact the patient by telephone for a follow-up status report regarding this interventional procedure. Comments:  No additional relevant information. 5 out of 5 strength bilateral lower extremity: Plantar flexion, dorsiflexion, knee flexion, knee extension.  Plan of Care  Follow-up in 3-4 weeks for postprocedural evaluation.  Imaging Orders     DG C-Arm 1-60 Min-No Report Procedure Orders    No procedure(s) ordered today    Medications ordered for procedure: Meds ordered this encounter  Medications  . ropivacaine (PF) 2 mg/mL (0.2%) (NAROPIN) injection 2 mL  . sodium chloride flush (NS) 0.9 % injection 2 mL  . iopamidol (ISOVUE-M) 41 % intrathecal injection 10 mL  . lidocaine (PF) (XYLOCAINE) 1 % injection 4.5 mL  . dexamethasone (DECADRON) injection 10 mg   Medications administered: We administered ropivacaine (PF) 2 mg/mL (0.2%), sodium chloride flush, iopamidol, lidocaine (PF), and dexamethasone.  See the medical record for exact dosing, route, and time of administration.  This SmartLink is deprecated. Use AVSMEDLIST instead to display the medication list for a patient. Disposition: Discharge home  Discharge Date & Time: 07/21/2017; 1140 hrs.   Physician-requested Follow-up: Return in about 4 weeks (around 08/18/2017) for Post Procedure Evaluation. Future  Appointments  Date Time Provider [place]  08/16/2017  2:00 PM Gillis Santa, MD [facility] None  11/19/2017 11:00 AM [facility] None  11/19/2017 11:30 AM Cammie Sickle, MD [facility] None   Primary Care Physician: Cletis Athens, MD Location: [facility] Out[facility] Management Facility Note by: Gillis Santa, MD Date: 07/21/2017; Time: 12:29 PM  Disclaimer:  Medicine is not an exact science. The only guarantee in medicine is that  nothing is guaranteed. It is important to note that the decision to proceed with this intervention was based on the information collected from the patient. The Data and conclusions were drawn from the patient's questionnaire, the interview, and the physical examination. Because the information was provided in large part by the patient, it cannot be guaranteed that it has not been purposely or unconsciously manipulated. Every effort has been made to obtain as much relevant data as possible for this evaluation. It is important to note that the conclusions that lead to this procedure are derived in large part from the available data. Always take into account that the treatment will also be dependent on availability of resources and existing treatment guidelines, considered by other Pain Management Practitioners as being common knowledge and practice, at the time of the intervention. For Medico-Legal purposes, it is also important to point out that variation in procedural techniques and pharmacological choices are the acceptable norm. The indications, contraindications, technique, and results of the above procedure should only be interpreted and judged by a Board-Certified Interventional Pain Specialist with extensive familiarity and expertise in the same exact procedure and technique.

## 2017-07-21 NOTE — Patient Instructions (Addendum)
Post-procedure Information What to expect: Most procedures involve the use of a local anesthetic (numbing medicine), and a steroid (anti-inflammatory medicine).  The local anesthetics may cause temporary numbness and weakness of the legs or arms, depending on the location of the block. This numbness/weakness may last 4-6 hours, depending on the local anesthetic used. In rare instances, it can last up to 24 hours. While numb, you must be very careful not to injure the extremity.  After any procedure, you could expect the pain to get better within 15-20 minutes. This relief is temporary and may last 4-6 hours. Once the local anesthetics wears off, you could experience discomfort, possibly more than usual, for up to 10 (ten) days. In the case of radiofrequencies, it may last up to 6 weeks. Surgeries may take up to 8 weeks for the healing process. The discomfort is due to the irritation caused by needles going through skin and muscle. To minimize the discomfort, we recommend using ice the first day, and heat from then on. The ice should be applied for 15 minutes on, and 15 minutes off. Keep repeating this cycle until bedtime. Avoid applying the ice directly to the skin, to prevent frostbite. Heat should be used daily, until the pain improves (4-10 days). Be careful not to burn yourself.  Occasionally you may experience muscle spasms or cramps. These occur as a consequence of the irritation caused by the needle sticks to the muscle and the blood that will inevitably be lost into the surrounding muscle tissue. Blood tends to be very irritating to tissues, which tend to react by going into spasm. These spasms may start the same day of your procedure, but they may also take days to develop. This late onset type of spasm or cramp is usually caused by electrolyte imbalances triggered by the steroids, at the level of the kidney. Cramps and spasms tend to respond well to muscle relaxants, multivitamins (some are  triggered by the procedure, but may have their origins in vitamin deficiencies), and "Gatorade", or any sports drinks that can replenish any electrolyte imbalances. (If you are a diabetic, ask your pharmacist to get you a sugar-free brand.) Warm showers or baths may also be helpful. Stretching exercises are highly recommended. General Instructions:  Be alert for signs of possible infection: redness, swelling, heat, red streaks, elevated temperature, and/or fever. These typically appear 4 to 6 days after the procedure. Immediately notify your doctor if you experience unusual bleeding, difficulty breathing, or loss of bowel or bladder control. If you experience increased pain, do not increase your pain medicine intake, unless instructed by your pain physician. Post-Procedure Care:  Be careful in moving about. Muscle spasms in the area of the injection may occur. Applying ice or heat to the area is often helpful. The incidence of spinal headaches after epidural injections ranges between 1.4% and 6%. If you develop a headache that does not seem to respond to conservative therapy, please let your physician know. This can be treated with an epidural blood patch.   Post-procedure numbness or redness is to be expected, however it should average 4 to 6 hours. If numbness and weakness of your extremities begins to develop 4 to 6 hours after your procedure, and is felt to be progressing and worsening, immediately contact your physician.   Diet:  If you experience nausea, do not eat until this sensation goes away. If you had a "Stellate Ganglion Block" for upper extremity "Reflex Sympathetic Dystrophy", do not eat or drink until your   hoarseness goes away. In any case, always start with liquids first and if you tolerate them well, then slowly progress to more solid foods. Activity:  For the first 4 to 6 hours after the procedure, use caution in moving about as you may experience numbness and/or weakness. Use caution in  cooking, using household electrical appliances, and climbing steps. If you need to reach your Doctor call our office: [phone] Monday-Thursday 8:00 am - 4:00 PM    Fridays: Closed     In case of an emergency: In case of emergency, call 911 or go to the nearest emergency room and have the physician there call us.  Interpretation of Procedure Every nerve block has two components: a diagnostic component, and a treatment component. Unrealistic expectations are the most common causes of "perceived failure".  In a perfect world, a single nerve block should be able to completely and permanently eliminate the pain. Sadly, the world is not perfect.  Most pain management nerve blocks are performed using local anesthetics and steroids. Steroids are responsible for any long-term benefit that you may experience. Their purpose is to decrease any chronic swelling that may exist in the area. Steroids begin to work immediately after being injected. However, most patients will not experience any benefits until 5 to 10 days after the injection, when the swelling has come down to the point where they can tell a difference. Steroids will only help if there is swelling to be treated. As such, they can assist with the diagnosis. If effective, they suggest an inflammatory component to the pain, and if ineffective, they rule out inflammation as the main cause or component of the problem. If the problem is one of mechanical compression, you will get no benefit from those steroids.   In the case of local anesthetics, they have a crucial role in the diagnosis of your condition. Most will begin to work within15 to 20 minutes after injection. The duration will depend on the type used (short- vs. Long-acting). It is of outmost importance that patients keep tract of their pain, after the procedure. To assist with this matter, a "Post-procedure Pain Diary" is provided. Make sure to complete it and to bring it back to your  follow-up appointment.  As long as the patient keeps accurate, detailed records of their symptoms after every procedure, and returns to have those interpreted, every procedure will provide us with invaluable information. Even a block that does not provide the patient with any relief, will always provide us with information about the mechanism and the origin of the pain. The only time a nerve block can be considered a waste of time is when patients do not keep track of the results, or do not keep their post-procedure appointment.  Reporting the results back to your physician The Pain Score  Pain is a subjective complaint. It cannot be seen, touched, or measured. We depend entirely on the patient's report of the pain in order to assess your condition and treatment. To evaluate the pain, we use a pain scale, where "0" means "No Pain", and a "10" is "the worst possible pain that you can even imagine" (i.e. something like been eaten alive by a shark or being torn apart by a lion).   You will frequently be asked to rate your pain. Please be as accurate, remember that medical decisions will be based on your responses. Please do not rate your pain above a 10. Doing so is actually interpreted as "symptom magnification" (exaggeration), as   well as lack of understanding with regards to the scale. To put this into perspective, when you tell us that your pain is at a 10 (ten), what you are saying is that there is nothing we can do to make this pain any worse. (Carefully think about that.)Pain Management Discharge Instructions  General Discharge Instructions :  If you need to reach your doctor call: Monday-Friday 8:00 am - 4:00 pm at [phone] or toll free [phone].  After clinic hours [phone] to have operator reach doctor.  Bring all of your medication bottles to all your appointments in the pain clinic.  To cancel or reschedule your appointment with Pain Management please remember to call 24 hours  in advance to avoid a fee.  Refer to the educational materials which you have been given on: General Risks, I had my Procedure. Discharge Instructions, Post Sedation.  Post Procedure Instructions:  The drugs you were given will stay in your system until tomorrow, so for the next 24 hours you should not drive, make any legal decisions or drink any alcoholic beverages.  You may eat anything you prefer, but it is better to start with liquids then soups and crackers, and gradually work up to solid foods.  Please notify your doctor immediately if you have any unusual bleeding, trouble breathing or pain that is not related to your normal pain.  Depending on the type of procedure that was done, some parts of your body may feel week and/or numb.  This usually clears up by tonight or the next day.  Walk with the use of an assistive device or accompanied by an adult for the 24 hours.  You may use ice on the affected area for the first 24 hours.  Put ice in a Ziploc bag and cover with a towel and place against area 15 minutes on 15 minutes off.  You may switch to heat after 24 hours.

## 2017-07-21 NOTE — Progress Notes (Signed)
Safety precautions to be maintained throughout the outpatient stay will include: orient to surroundings, keep bed in low position, maintain call bell within reach at all times, provide assistance with transfer out of bed and ambulation.  

## 2017-07-22 ENCOUNTER — Telehealth: Payer: Self-pay

## 2017-07-22 NOTE — Telephone Encounter (Signed)
Post procedure phone call.  LM 

## 2017-07-26 DIAGNOSIS — E041 Nontoxic single thyroid nodule: Secondary | ICD-10-CM | POA: Diagnosis not present

## 2017-07-26 DIAGNOSIS — Z8572 Personal history of non-Hodgkin lymphomas: Secondary | ICD-10-CM | POA: Diagnosis not present

## 2017-08-16 ENCOUNTER — Encounter: Payer: Self-pay | Admitting: Student in an Organized Health Care Education/Training Program

## 2017-08-16 ENCOUNTER — Ambulatory Visit
Payer: Medicare Other | Attending: Student in an Organized Health Care Education/Training Program | Admitting: Student in an Organized Health Care Education/Training Program

## 2017-08-16 ENCOUNTER — Other Ambulatory Visit: Payer: Self-pay

## 2017-08-16 VITALS — BP 118/79 | HR 90 | Temp 97.9°F | Resp 18 | Ht 66.5 in | Wt 160.0 lb

## 2017-08-16 DIAGNOSIS — G20 Parkinson's disease: Secondary | ICD-10-CM

## 2017-08-16 DIAGNOSIS — M5116 Intervertebral disc disorders with radiculopathy, lumbar region: Secondary | ICD-10-CM | POA: Insufficient documentation

## 2017-08-16 DIAGNOSIS — Z981 Arthrodesis status: Secondary | ICD-10-CM

## 2017-08-16 DIAGNOSIS — C8290 Follicular lymphoma, unspecified, unspecified site: Secondary | ICD-10-CM | POA: Diagnosis not present

## 2017-08-16 DIAGNOSIS — E7800 Pure hypercholesterolemia, unspecified: Secondary | ICD-10-CM | POA: Diagnosis not present

## 2017-08-16 DIAGNOSIS — M5136 Other intervertebral disc degeneration, lumbar region: Secondary | ICD-10-CM

## 2017-08-16 DIAGNOSIS — M205X9 Other deformities of toe(s) (acquired), unspecified foot: Secondary | ICD-10-CM | POA: Diagnosis not present

## 2017-08-16 DIAGNOSIS — Z87891 Personal history of nicotine dependence: Secondary | ICD-10-CM | POA: Diagnosis not present

## 2017-08-16 DIAGNOSIS — M5416 Radiculopathy, lumbar region: Secondary | ICD-10-CM | POA: Diagnosis not present

## 2017-08-16 DIAGNOSIS — F419 Anxiety disorder, unspecified: Secondary | ICD-10-CM | POA: Diagnosis not present

## 2017-08-16 DIAGNOSIS — F329 Major depressive disorder, single episode, unspecified: Secondary | ICD-10-CM | POA: Diagnosis not present

## 2017-08-16 DIAGNOSIS — F32A Depression, unspecified: Secondary | ICD-10-CM

## 2017-08-16 DIAGNOSIS — M79604 Pain in right leg: Secondary | ICD-10-CM | POA: Diagnosis not present

## 2017-08-16 DIAGNOSIS — E669 Obesity, unspecified: Secondary | ICD-10-CM | POA: Insufficient documentation

## 2017-08-16 MED ORDER — KETOROLAC TROMETHAMINE 30 MG/ML IJ SOLN
30.0000 mg | Freq: Once | INTRAMUSCULAR | Status: AC
  Administered 2017-08-16: 30 mg via INTRAMUSCULAR
  Filled 2017-08-16: qty 1

## 2017-08-16 MED ORDER — GABAPENTIN 300 MG PO CAPS: ORAL_CAPSULE | ORAL | 2 refills | Status: DC

## 2017-08-16 MED ORDER — ORPHENADRINE CITRATE 30 MG/ML IJ SOLN
30.0000 mg | Freq: Once | INTRAMUSCULAR | Status: AC
  Administered 2017-08-16: 30 mg via INTRAMUSCULAR
  Filled 2017-08-16: qty 2

## 2017-08-16 NOTE — Progress Notes (Signed)
Patient's Name: Sarah Donaldson  MRN: [id number]  Referring Provider: Cletis Athens, MD  DOB: 1950/09/29  PCP: Cletis Athens, MD  DOS: 08/16/2017  Note by: Gillis Santa, MD  Service setting: Ambulatory outpatient  Specialty: Interventional Pain Management  Location: [facility] (AMB) Pain Management Facility    Patient type: Established   Primary Reason(s) for Visit: Encounter for post-procedure evaluation of chronic illness with mild to moderate exacerbation CC: Back Pain (low) and Leg Pain (right)  HPI  Ms. Canby is a 67 y.o. year old, female patient, who comes today for a post-procedure evaluation. She has Anxiety; Back ache; Low back pain with sciatica; Colon polyp; Clinical depression; Difficulty hearing; H/O adenomatous polyp of colon; Hypercholesteremia; Adiposity; Idiopathic Parkinson's disease ([place]); REM sleep behavior disorder; Detached retina; Disordered sleep; Has a tremor; Hallux limitus; Lymphoma ([place]); and Follicular lymphoma of extranodal and solid organ sites [facility]) on their problem list. Her primarily concern today is the Back Pain (low) and Leg Pain (right)  Pain Assessment: Location: Lower Back Radiating: right leg Onset: More than a month ago Duration: Chronic pain Quality: Constant(annoying, "hammering nails" feeling) Severity: 10-Worst pain ever/10 (self-reported pain score)  Note: Reported level is inconsistent with clinical observations. Clinically the patient looks like a 3/10 A 3/10 is viewed as "Moderate" and described as significantly interfering with activities of daily living (ADL). It becomes difficult to feed, bathe, get dressed, get on and off the toilet or to perform personal hygiene functions. Difficult to get in and out of bed or a chair without assistance. Very distracting. With effort, it can be ignored when deeply involved in activities.       When using our objective Pain Scale, levels between 6 and 10/10 are said to belong in an emergency room, as it progressively  worsens from a 6/10, described as severely limiting, requiring emergency care not usually available at an outpatient pain management facility. At a 6/10 level, communication becomes difficult and requires great effort. Assistance to reach the emergency department may be required. Facial flushing and profuse sweating along with potentially dangerous increases in heart rate and blood pressure will be evident. Effect on ADL:   Timing: Constant Modifying factors: gabapentin, Tylenol  Ms. Spillman comes in today for post-procedure evaluation after the treatment done on 07/21/2017.  Further details on both, my assessment(s), as well as the proposed treatment plan, please see below.  Post-Procedure Assessment  07/21/2017 Procedure: Right L3-L4 ESI Pre-procedure pain score:  8/10 Post-procedure pain score: 8/10         Influential Factors: BMI: 25.44 kg/m Intra-procedural challenges: None observed.         Assessment challenges: None detected.              Reported side-effects: None.        Post-procedural adverse reactions or complications: None reported         Sedation: Please see nurses note. When no sedatives are used, the analgesic levels obtained are directly associated to the effectiveness of the local anesthetics. However, when sedation is provided, the level of analgesia obtained during the initial 1 hour following the intervention, is believed to be the result of a combination of factors. These factors may include, but are not limited to: 1. The effectiveness of the local anesthetics used. 2. The effects of the analgesic(s) and/or anxiolytic(s) used. 3. The degree of discomfort experienced by the patient at the time of the procedure. 4. The patients ability and reliability in recalling and recording the events.  5. The presence and influence of possible secondary gains and/or psychosocial factors. Reported result: Relief experienced during the 1st hour after the procedure: 70 %  (Ultra-Short Term Relief)            Interpretative annotation: Clinically appropriate result. Analgesia during this period is likely to be Local Anesthetic and/or IV Sedative (Analgesic/Anxiolytic) related.          Effects of local anesthetic: The analgesic effects attained during this period are directly associated to the localized infiltration of local anesthetics and therefore cary significant diagnostic value as to the etiological location, or anatomical origin, of the pain. Expected duration of relief is directly dependent on the pharmacodynamics of the local anesthetic used. Long-acting (4-6 hours) anesthetics used.  Reported result: Relief during the next 4 to 6 hour after the procedure: 70 % (Short-Term Relief)            Interpretative annotation: Clinically appropriate result. Analgesia during this period is likely to be Local Anesthetic-related.          Long-term benefit: Defined as the period of time past the expected duration of local anesthetics (1 hour for short-acting and 4-6 hours for long-acting). With the possible exception of prolonged sympathetic blockade from the local anesthetics, benefits during this period are typically attributed to, or associated with, other factors such as analgesic sensory neuropraxia, antiinflammatory effects, or beneficial biochemical changes provided by agents other than the local anesthetics.  Reported result: Extended relief following procedure: 70 %(for 2-3 days) (Long-Term Relief)            Interpretative annotation: Clinically appropriate result. Good relief. No permanent benefit expected. Inflammation plays a part in the etiology to the pain.          Current benefits: Defined as reported results that persistent at this point in time.   Analgesia: <25 %            Function: Back to baseline ROM: Back to baseline Interpretative annotation: Recurrence of symptoms. No permanent benefit expected. Effective diagnostic intervention.           Interpretation: Results would suggest a successful diagnostic intervention.                  Plan:  Please see "Plan of Care" for details.        Laboratory Chemistry  Inflammation Markers (CRP: Acute Phase) (ESR: Chronic Phase) Lab Results  Component Value Date   ESRSEDRATE 4 06/07/2013                 Rheumatology Markers No results found for: RF, ANA, LABURIC, URICUR, LYMEIGGIGMAB, LYMEABIGMQN              Renal Function Markers Lab Results  Component Value Date   BUN 28 (H) 05/21/2017   CREATININE 0.69 05/21/2017   GFRAA >60 05/21/2017   GFRNONAA >60 05/21/2017                 Hepatic Function Markers Lab Results  Component Value Date   AST 20 05/21/2017   ALT <5 (L) 05/21/2017   ALBUMIN 3.8 05/21/2017   ALKPHOS 88 05/21/2017                 Electrolytes Lab Results  Component Value Date   NA 141 05/21/2017   K 4.1 05/21/2017   CL 107 05/21/2017   CALCIUM 9.3 05/21/2017  Neuropathy Markers No results found for: VITAMINB12, FOLATE, HGBA1C, HIV               Bone Pathology Markers No results found for: VD25OH, [id number], [id number], LT9030SP2, 25OHVITD1, 25OHVITD2, 25OHVITD3, TESTOFREE, TESTOSTERONE               Coagulation Parameters Lab Results  Component Value Date   PLT 189 05/21/2017                 Cardiovascular Markers Lab Results  Component Value Date   HGB 14.0 05/21/2017   HCT 42.2 05/21/2017                 CA Markers No results found for: CEA, CA125, LABCA2               Note: Lab results reviewed.  Recent Diagnostic Imaging Results  DG C-Arm 1-60 Min-No Report Fluoroscopy was utilized by the requesting physician.  No radiographic  interpretation.   Complexity Note: Imaging results reviewed. Results shared with Ms. Mountjoy, using Layman's terms.                         Meds   Current Outpatient Medications:  .  Acetaminophen (TYLENOL ARTHRITIS PAIN PO), Take by mouth 2 (two) times daily., Disp: , Rfl:  .   carbidopa-levodopa (SINEMET IR) 25-100 MG per tablet, TAKE 1.5 tablets BY MOUTH every 4 hrs, Disp: , Rfl: 0 .  clonazePAM (KLONOPIN) 1 MG tablet, Take 2 mg by mouth daily. , Disp: , Rfl:  .  furosemide (LASIX) 40 MG tablet, Take 20 mg by mouth as needed for fluid. Twice a week, Disp: , Rfl:  .  meloxicam (MOBIC) 7.5 MG tablet, Take 7.5 mg by mouth daily. 1 or 2 tablets daily, Disp: , Rfl:  .  omeprazole (PRILOSEC) 20 MG capsule, Take 20 mg by mouth daily., Disp: , Rfl: 3 .  pramipexole (MIRAPEX) 1.5 MG tablet, Take 1.5 mg by mouth 3 (three) times daily., Disp: , Rfl: 5 .  rosuvastatin (CRESTOR) 10 MG tablet, Take 10 mg by mouth daily., Disp: , Rfl:  .  selegiline (ELDEPRYL) 5 MG capsule, TAKE 1 CAPSULE BY MOUTH WITH BREAKFAST AND LUNCH., Disp: , Rfl: 1 .  traZODone (DESYREL) 50 MG tablet, TAKE 1-2 TABLETS NIGHTLY AS NEEDED FOR SLEEP., Disp: , Rfl: 1 .  gabapentin (NEURONTIN) 300 MG capsule, 300 mg qhs x 2 weeks then 600 mg qhs x 2 weeks. If no side effects, can add 300 mg qAM to 600 mg qhs, Disp: 90 capsule, Rfl: 2  ROS  Constitutional: Denies any fever or chills Gastrointestinal: No reported hemesis, hematochezia, vomiting, or acute GI distress Musculoskeletal: Denies any acute onset joint swelling, redness, loss of ROM, or weakness Neurological: No reported episodes of acute onset apraxia, aphasia, dysarthria, agnosia, amnesia, paralysis, loss of coordination, or loss of consciousness  Allergies  Ms. Mathurin is allergic to codeine sulfate.  [place]  Drug: Ms. Cake  reports that she does not use drugs. Alcohol:  reports that she drinks alcohol. Tobacco:  reports that she has quit smoking. she has never used smokeless tobacco. Medical:  has a past medical history of Cancer ([place]), Parkinson disease ([place]), and Parkinson disease ([place]). Surgical: Ms. Mcmeans  has a past surgical history that includes Retinal detachment surgery; Bunionectomy; Nasal septum surgery; and Back surgery. Family: family  history includes Heart disease in her mother.  Constitutional Exam  General appearance: Well nourished,  well developed, and well hydrated. In no apparent acute distress Vitals:   08/16/17 1420  BP: 118/79  Pulse: 90  Resp: 18  Temp: 97.9 F (36.6 C)  TempSrc: Oral  SpO2: 95%  Weight: 160 lb (72.6 kg)  Height: 5' 6.5" (1.689 m)   BMI Assessment: Estimated body mass index is 25.44 kg/m as calculated from the following:   Height as of this encounter: 5' 6.5" (1.689 m).   Weight as of this encounter: 160 lb (72.6 kg).  BMI interpretation table: BMI level Category Range association with higher incidence of chronic pain  <18 kg/m2 Underweight   18.5-24.9 kg/m2 Ideal body weight   25-29.9 kg/m2 Overweight Increased incidence by 20%  30-34.9 kg/m2 Obese (Class I) Increased incidence by 68%  35-39.9 kg/m2 Severe obesity (Class II) Increased incidence by 136%  >40 kg/m2 Extreme obesity (Class III) Increased incidence by 254%   BMI Readings from Last 4 Encounters:  08/16/17 25.44 kg/m  07/21/17 25.82 kg/m  07/14/17 25.82 kg/m  05/21/17 25.69 kg/m   Wt Readings from Last 4 Encounters:  08/16/17 160 lb (72.6 kg)  07/21/17 160 lb (72.6 kg)  07/14/17 160 lb (72.6 kg)  05/21/17 161 lb 9.6 oz (73.3 kg)  Psych/Mental status: Alert, oriented x 3 (person, place, & time)       Eyes: PERLA Respiratory: No evidence of acute respiratory distress  Cervical Spine Area Exam  Skin & Axial Inspection: No masses, redness, edema, swelling, or associated skin lesions Alignment: Symmetrical Functional ROM: Unrestricted ROM      Stability: No instability detected Muscle Tone/Strength: Functionally intact. No obvious neuro-muscular anomalies detected. Sensory (Neurological): Unimpaired Palpation: No palpable anomalies              Upper Extremity (UE) Exam    Side: Right upper extremity  Side: Left upper extremity  Skin & Extremity Inspection: Skin color, temperature, and hair growth are  WNL. No peripheral edema or cyanosis. No masses, redness, swelling, asymmetry, or associated skin lesions. No contractures.  Skin & Extremity Inspection: Skin color, temperature, and hair growth are WNL. No peripheral edema or cyanosis. No masses, redness, swelling, asymmetry, or associated skin lesions. No contractures.  Functional ROM: Unrestricted ROM          Functional ROM: Unrestricted ROM          Muscle Tone/Strength: Functionally intact. No obvious neuro-muscular anomalies detected.  Muscle Tone/Strength: Functionally intact. No obvious neuro-muscular anomalies detected.  Sensory (Neurological): Unimpaired          Sensory (Neurological): Unimpaired          Palpation: No palpable anomalies              Palpation: No palpable anomalies              Specialized Test(s): Deferred         Specialized Test(s): Deferred          Thoracic Spine Area Exam  Skin & Axial Inspection: No masses, redness, or swelling Alignment: Symmetrical Functional ROM: Unrestricted ROM Stability: No instability detected Muscle Tone/Strength: Functionally intact. No obvious neuro-muscular anomalies detected. Sensory (Neurological): Unimpaired Muscle strength & Tone: No palpable anomalies  Lumbar Spine Area Exam  Skin & Axial Inspection: Well healed scar from previous spine surgery detected Alignment: Symmetrical Functional ROM: Decreased ROM, bilaterally, lumbar extension specifically Stability: No instability detected Muscle Tone/Strength: Functionally intact. No obvious neuro-muscular anomalies detected. Sensory (Neurological): Dermatomal pain pattern Palpation: Complains of area being tender to palpation  Bilateral Fist Percussion Test Provocative Tests: Lumbar Hyperextension and rotation test: Positive bilaterally for facet joint pain.right greater than left Lumbar Lateral bending test: Positive ipsilateral radicular pain, on the right. Positive for right-sided foraminal stenosis. Patrick's Maneuver:  Positive for bilateral S-I arthralgia              Gait & Posture Assessment  Ambulation: Limited Gait: Antalgic Posture: WNL   Lower Extremity Exam    Side: Right lower extremity  Side: Left lower extremity  Skin & Extremity Inspection: Skin color, temperature, and hair growth are WNL. No peripheral edema or cyanosis. No masses, redness, swelling, asymmetry, or associated skin lesions. No contractures.  Skin & Extremity Inspection: Skin color, temperature, and hair growth are WNL. No peripheral edema or cyanosis. No masses, redness, swelling, asymmetry, or associated skin lesions. No contractures.  Functional ROM: Unrestricted ROM          Functional ROM: Unrestricted ROM          Muscle Tone/Strength: Functionally intact. No obvious neuro-muscular anomalies detected.  Muscle Tone/Strength: Functionally intact. No obvious neuro-muscular anomalies detected.  Sensory (Neurological): Unimpaired  Sensory (Neurological): Unimpaired  Palpation: No palpable anomalies  Palpation: No palpable anomalies     Assessment  Primary Diagnosis & Pertinent Problem List: The primary encounter diagnosis was Lumbar radiculopathy. Diagnoses of S/P lumbar fusion, Lumbar degenerative disc disease, Idiopathic Parkinson's disease ([place]), and Depression, unspecified depression type were also pertinent to this visit.  Status Diagnosis  Persistent Persistent Persistent 1. Lumbar radiculopathy   2. S/P lumbar fusion   3. Lumbar degenerative disc disease   4. Idiopathic Parkinson's disease ([place])   5. Depression, unspecified depression type      67 year old female with a history of Parkinson's disease, follicular lymphoma status post chemotherapy, low back pain that radiates into bilateral legs, right greater than left status post L1/L2 interbody fusion in November 2017 status post epidural steroid injections and sacroiliac joint injection which have not been very effective for her chronic radicular pain.   Patient is a poor historian.  She has memory recall issues and cognitive issues secondary to her Parkinson's.  62 of HPI was obtained from the patient's husband.   At previous visits, I have had extensive discussion with the patient's about the risks and benefits of spinal cord stimulation.  I reviewed the patient's lumbar MRI with the patient and her husband.  I also used a spine model to review spinal cord stimulator electrodes and the pulse generator.  I reviewed details about workup for the trial which includes thoracic MRI to rule out thoracic canal stenosis, psychological evaluation regarding suitability for SCS trial and implant.   I do have concerns regarding suitability of spinal cord stimulator trial in this patient.  First off the patient has memory issues that during my evaluation were fairly prominent.  She was unable to recall the dates of her previous interventions and she often had alternating stories about her pain symptoms and the associated relief that she has received with previous interventions.  During our conversations, her husband would correct her on many occasions.  Her cognitive status may limit her ability to fully understand the utility and perceived benefits of spinal cord stimulation.  Patient has very poor memory recall.  Furthermore I am concerned about the patient's constant movement which she states is secondary to her Parkinson's.  For me to do the trial safely, I informed her that she would need to lie prone on our fluoroscopy table for 1-2  hours and that any movement during the trial could increase her risk of nerve injury and spinal cord injury. For the reasons states above, patient will not be a suitable candidate for SCS trial.   She returns today for follow up after her previous right L3-L4 ESI which was not effective long term for her radicular symptoms.  Patient noted approximately 70% improvement in her radicular symptoms for 2-3 days after the procedure but  will return if symptoms thereafter.  I would not recommend repeating lumbar epidural steroid injection given only mild, short-term pain relief.  In regards to management options, I will try to optimize her non-opioid analgesics.  I will have her increase gabapentin to 300 mg nightly for 2 weeks then 600 mg nightly for 2 weeks then adding 300 mg daytime dose.  We will also give her intramuscular Toradol and Norflex today for her acute pain exacerbation.  Plan:  -intramuscular Toradol and Norflex as below -Gabapentin refill as below -Not a candidate for spinal cord stimulation for reasons listed above (Concern about the patient's mental status, memory recall, cognitive state as it pertains to an implantable therapy such as spinal cord stimulation. Concern about patient's continuous movement which may preclude safely doing the trial.  Patient will also need to limit her bending, lifting, twisting while trial is in place which I am also afraid that she will not be able to do given her Parkinson symptoms)  Plan of Care  Pharmacotherapy (Medications Ordered): Meds ordered this encounter  Medications  . orphenadrine (NORFLEX) injection 30 mg  . ketorolac (TORADOL) 30 MG/ML injection 30 mg  . gabapentin (NEURONTIN) 300 MG capsule    Sig: 300 mg qhs x 2 weeks then 600 mg qhs x 2 weeks. If no side effects, can add 300 mg qAM to 600 mg qhs    Dispense:  90 capsule    Refill:  2    Do not place this medication, or any other prescription from our practice, on "Automatic Refill". Patient may have prescription filled one day early if pharmacy is closed on scheduled refill date.   Provider-requested follow-up: Return in about 6 weeks (around 09/27/2017) for Medication Management.  Future Appointments  Date Time Provider [place]  09/23/2017  1:45 PM Gillis Santa, MD [facility] None  11/19/2017 11:00 AM [facility] None  11/19/2017 11:30 AM Cammie Sickle, MD [facility] None    Time Note: Greater than 50% of the 25 minute(s) of face-to-face time spent with Ms. Paone, was spent in counseling/coordination of care regarding: the appropriate use of the pain scale, the treatment plan, treatment alternatives, realistic expectations, the goals of pain management (increased in functionality) and the importance of providing [place] with accurate post-procedure information.  Primary Care Physician: Cletis Athens, MD Location: [facility] Note by: Gillis Santa, M.D Date: 08/16/2017; Time: 3:25 PM  Patient Instructions  1.  Start gabapentin 300 mg nightly.  Increase to 600 mg nightly in 2 weeks.  If no side effects, increase to 300 mg every morning, 600 mg nightly.  2.  IM Toradol and Norflex today.

## 2017-08-16 NOTE — Progress Notes (Signed)
Safety precautions to be maintained throughout the outpatient stay will include: orient to surroundings, keep bed in low position, maintain call bell within reach at all times, provide assistance with transfer out of bed and ambulation.  

## 2017-08-16 NOTE — Patient Instructions (Signed)
1.  Start gabapentin 300 mg nightly.  Increase to 600 mg nightly in 2 weeks.  If no side effects, increase to 300 mg every morning, 600 mg nightly.  2.  IM Toradol and Norflex today.

## 2017-09-23 ENCOUNTER — Encounter: Payer: Self-pay | Admitting: Student in an Organized Health Care Education/Training Program

## 2017-09-23 ENCOUNTER — Ambulatory Visit
Payer: Medicare Other | Attending: Student in an Organized Health Care Education/Training Program | Admitting: Student in an Organized Health Care Education/Training Program

## 2017-09-23 ENCOUNTER — Other Ambulatory Visit: Payer: Self-pay

## 2017-09-23 VITALS — BP 142/77 | HR 93 | Temp 98.4°F | Resp 18 | Ht 66.0 in | Wt 165.0 lb

## 2017-09-23 DIAGNOSIS — G8929 Other chronic pain: Secondary | ICD-10-CM | POA: Insufficient documentation

## 2017-09-23 DIAGNOSIS — M205X9 Other deformities of toe(s) (acquired), unspecified foot: Secondary | ICD-10-CM | POA: Insufficient documentation

## 2017-09-23 DIAGNOSIS — M5440 Lumbago with sciatica, unspecified side: Secondary | ICD-10-CM | POA: Insufficient documentation

## 2017-09-23 DIAGNOSIS — Z79899 Other long term (current) drug therapy: Secondary | ICD-10-CM | POA: Insufficient documentation

## 2017-09-23 DIAGNOSIS — M5116 Intervertebral disc disorders with radiculopathy, lumbar region: Secondary | ICD-10-CM | POA: Diagnosis not present

## 2017-09-23 DIAGNOSIS — E7800 Pure hypercholesterolemia, unspecified: Secondary | ICD-10-CM | POA: Insufficient documentation

## 2017-09-23 DIAGNOSIS — Z9221 Personal history of antineoplastic chemotherapy: Secondary | ICD-10-CM | POA: Insufficient documentation

## 2017-09-23 DIAGNOSIS — Z981 Arthrodesis status: Secondary | ICD-10-CM | POA: Diagnosis not present

## 2017-09-23 DIAGNOSIS — F32A Depression, unspecified: Secondary | ICD-10-CM

## 2017-09-23 DIAGNOSIS — C8290 Follicular lymphoma, unspecified, unspecified site: Secondary | ICD-10-CM | POA: Diagnosis not present

## 2017-09-23 DIAGNOSIS — G20 Parkinson's disease: Secondary | ICD-10-CM | POA: Diagnosis not present

## 2017-09-23 DIAGNOSIS — E669 Obesity, unspecified: Secondary | ICD-10-CM | POA: Diagnosis not present

## 2017-09-23 DIAGNOSIS — D126 Benign neoplasm of colon, unspecified: Secondary | ICD-10-CM | POA: Diagnosis not present

## 2017-09-23 DIAGNOSIS — F329 Major depressive disorder, single episode, unspecified: Secondary | ICD-10-CM | POA: Diagnosis not present

## 2017-09-23 DIAGNOSIS — H3320 Serous retinal detachment, unspecified eye: Secondary | ICD-10-CM | POA: Insufficient documentation

## 2017-09-23 DIAGNOSIS — F419 Anxiety disorder, unspecified: Secondary | ICD-10-CM | POA: Diagnosis not present

## 2017-09-23 DIAGNOSIS — G4752 REM sleep behavior disorder: Secondary | ICD-10-CM | POA: Insufficient documentation

## 2017-09-23 DIAGNOSIS — H9190 Unspecified hearing loss, unspecified ear: Secondary | ICD-10-CM | POA: Diagnosis not present

## 2017-09-23 DIAGNOSIS — M5136 Other intervertebral disc degeneration, lumbar region: Secondary | ICD-10-CM

## 2017-09-23 DIAGNOSIS — M5416 Radiculopathy, lumbar region: Secondary | ICD-10-CM

## 2017-09-23 DIAGNOSIS — Z87891 Personal history of nicotine dependence: Secondary | ICD-10-CM | POA: Diagnosis not present

## 2017-09-23 DIAGNOSIS — M545 Low back pain: Secondary | ICD-10-CM | POA: Diagnosis present

## 2017-09-23 MED ORDER — PREGABALIN 25 MG PO CAPS: ORAL_CAPSULE | ORAL | 1 refills | Status: DC

## 2017-09-23 MED ORDER — KETOROLAC TROMETHAMINE 30 MG/ML IJ SOLN
30.0000 mg | Freq: Once | INTRAMUSCULAR | Status: AC
  Administered 2017-09-23: 30 mg via INTRAMUSCULAR
  Filled 2017-09-23: qty 1

## 2017-09-23 MED ORDER — ORPHENADRINE CITRATE 30 MG/ML IJ SOLN
30.0000 mg | Freq: Once | INTRAMUSCULAR | Status: AC
  Administered 2017-09-23: 30 mg via INTRAMUSCULAR
  Filled 2017-09-23: qty 2

## 2017-09-23 NOTE — Patient Instructions (Signed)
1.  Stop gabapentin 2.  Start Lyrica 25 mg nightly for 2 weeks and increase to 25 mg twice daily. 3.  Intramuscular Toradol and Norflex today. 4.  Follow-up in 4-6 weeks for medication management.

## 2017-09-23 NOTE — Progress Notes (Signed)
Safety precautions to be maintained throughout the outpatient stay will include: orient to surroundings, keep bed in low position, maintain call bell within reach at all times, provide assistance with transfer out of bed and ambulation.  

## 2017-09-23 NOTE — Progress Notes (Signed)
Patient's Name: Sarah Donaldson  MRN: [id number]  Referring Provider: Cletis Athens, MD  DOB: 02/08/1951  PCP: Cletis Athens, MD  DOS: 09/23/2017  Note by: Gillis Santa, MD  Service setting: Ambulatory outpatient  Specialty: Interventional Pain Management  Location: [facility] (AMB) Pain Management Facility    Patient type: Established   Primary Reason(s) for Visit: Encounter for prescription drug management. (Level of risk: moderate)  CC: Back Pain (lower)  HPI  Ms. Roppolo is a 67 y.o. year old, female patient, who comes today for a medication management evaluation. She has Anxiety; Back ache; Low back pain with sciatica; Colon polyp; Clinical depression; Difficulty hearing; H/O adenomatous polyp of colon; Hypercholesteremia; Adiposity; Idiopathic Parkinson's disease ([place]); REM sleep behavior disorder; Detached retina; Disordered sleep; Has a tremor; Hallux limitus; Lymphoma ([place]); and Follicular lymphoma of extranodal and solid organ sites [facility]) on their problem list. Her primarily concern today is the Back Pain (lower)  Pain Assessment: Location: Lower Back Radiating: right leg to the mid calf Onset: More than a month ago Duration: Chronic pain Quality: Heaviness(twisting) Severity: 8 /10 (self-reported pain score)  Note: Reported level is inconsistent with clinical observations. Clinically the patient looks like a 3/10 A 3/10 is viewed as "Moderate" and described as significantly interfering with activities of daily living (ADL). It becomes difficult to feed, bathe, get dressed, get on and off the toilet or to perform personal hygiene functions. Difficult to get in and out of bed or a chair without assistance. Very distracting. With effort, it can be ignored when deeply involved in activities.       When using our objective Pain Scale, levels between 6 and 10/10 are said to belong in an emergency room, as it progressively worsens from a 6/10, described as severely limiting, requiring emergency care  not usually available at an outpatient pain management facility. At a 6/10 level, communication becomes difficult and requires great effort. Assistance to reach the emergency department may be required. Facial flushing and profuse sweating along with potentially dangerous increases in heart rate and blood pressure will be evident. Effect on ADL:   Timing: Constant Modifying factors: ice, lying down  Ms. Zea was last scheduled for an appointment on 08/16/2017 for medication management. During today's appointment we reviewed Ms. Atwater's chronic pain status, as well as her outpatient medication regimen. Patient unable to tolerate increased dose of gabapentin of 300 mg twice daily.  This resulted in sedation and we are dreams.  She states that she does not find 100 mg of gabapentin helpful and that this is the lowest dose that does not result in side effects.  Patient is requesting intramuscular Toradol and Norflex shots which were helpful in the past.  The patient  reports that she does not use drugs. Her body mass index is 26.63 kg/m.  Further details on both, my assessment(s), as well as the proposed treatment plan, please see below.   Laboratory Chemistry  Inflammation Markers (CRP: Acute Phase) (ESR: Chronic Phase) Lab Results  Component Value Date   ESRSEDRATE 4 06/07/2013                         Rheumatology Markers No results found for: RF, ANA, LABURIC, URICUR, LYMEIGGIGMAB, LYMEABIGMQN              Renal Function Markers Lab Results  Component Value Date   BUN 28 (H) 05/21/2017   CREATININE 0.69 05/21/2017   GFRAA >60 05/21/2017   GFRNONAA >  60 05/21/2017                 Hepatic Function Markers Lab Results  Component Value Date   AST 20 05/21/2017   ALT <5 (L) 05/21/2017   ALBUMIN 3.8 05/21/2017   ALKPHOS 88 05/21/2017                 Electrolytes Lab Results  Component Value Date   NA 141 05/21/2017   K 4.1 05/21/2017   CL 107 05/21/2017   CALCIUM 9.3  05/21/2017                        Neuropathy Markers No results found for: VITAMINB12, FOLATE, HGBA1C, HIV               Bone Pathology Markers No results found for: VD25OH, VD125OH2TOT, BW3893TD4, KA7681LX7, 25OHVITD1, 25OHVITD2, 25OHVITD3, TESTOFREE, TESTOSTERONE                       Coagulation Parameters Lab Results  Component Value Date   PLT 189 05/21/2017                 Cardiovascular Markers Lab Results  Component Value Date   HGB 14.0 05/21/2017   HCT 42.2 05/21/2017                 CA Markers No results found for: CEA, CA125, LABCA2               Note: Lab results reviewed.   Meds   Current Outpatient Medications:  .  Acetaminophen (TYLENOL ARTHRITIS PAIN PO), Take by mouth 2 (two) times daily., Disp: , Rfl:  .  carbidopa-levodopa (SINEMET IR) 25-100 MG per tablet, TAKE 1.5 tablets BY MOUTH every 4 hrs, Disp: , Rfl: 0 .  clonazePAM (KLONOPIN) 1 MG tablet, Take 2 mg by mouth daily. , Disp: , Rfl:  .  furosemide (LASIX) 40 MG tablet, Take 20 mg by mouth as needed for fluid. Twice a week, Disp: , Rfl:  .  omeprazole (PRILOSEC) 20 MG capsule, Take 20 mg by mouth daily., Disp: , Rfl: 3 .  pramipexole (MIRAPEX) 1.5 MG tablet, Take 1.5 mg by mouth 3 (three) times daily., Disp: , Rfl: 5 .  promethazine (PHENERGAN) 12.5 MG tablet, Take 12.5 mg by mouth every 6 (six) hours as needed for nausea or vomiting., Disp: , Rfl:  .  ranitidine (ZANTAC) 150 MG capsule, Take 150 mg by mouth every evening., Disp: , Rfl:  .  selegiline (ELDEPRYL) 5 MG capsule, TAKE 1 CAPSULE BY MOUTH WITH BREAKFAST AND LUNCH., Disp: , Rfl: 1 .  traZODone (DESYREL) 50 MG tablet, TAKE 1-2 TABLETS NIGHTLY AS NEEDED FOR SLEEP., Disp: , Rfl: 1 .  meloxicam (MOBIC) 7.5 MG tablet, Take 7.5 mg by mouth daily. 1 or 2 tablets daily, Disp: , Rfl:  .  pregabalin (LYRICA) 25 MG capsule, 25 mg nightly for 2 weeks then 25 mg twice daily., Disp: 60 capsule, Rfl: 1 .  rosuvastatin (CRESTOR) 10 MG tablet, Take 10 mg by  mouth daily., Disp: , Rfl:   ROS  Constitutional: Denies any fever or chills Gastrointestinal: No reported hemesis, hematochezia, vomiting, or acute GI distress Musculoskeletal: Denies any acute onset joint swelling, redness, loss of ROM, or weakness Neurological: No reported episodes of acute onset apraxia, aphasia, dysarthria, agnosia, amnesia, paralysis, loss of coordination, or loss of consciousness  Allergies  Ms. Ahart is allergic to codeine  sulfate.  [place]  Drug: Ms. Goers  reports that she does not use drugs. Alcohol:  reports that she drinks alcohol. Tobacco:  reports that she has quit smoking. she has never used smokeless tobacco. Medical:  has a past medical history of Cancer ([place]), Parkinson disease ([place]), and Parkinson disease ([place]). Surgical: Ms. Linck  has a past surgical history that includes Retinal detachment surgery; Bunionectomy; Nasal septum surgery; and Back surgery. Family: family history includes Heart disease in her mother.  Constitutional Exam  General appearance: Well nourished, well developed, and well hydrated. In no apparent acute distress Vitals:   09/23/17 1340  BP: (!) 142/77  Pulse: 93  Resp: 18  Temp: 98.4 F (36.9 C)  TempSrc: Oral  SpO2: 94%  Weight: 165 lb (74.8 kg)  Height: 5' 6" (1.676 m)   BMI Assessment: Estimated body mass index is 26.63 kg/m as calculated from the following:   Height as of this encounter: 5' 6" (1.676 m).   Weight as of this encounter: 165 lb (74.8 kg).  BMI interpretation table: BMI level Category Range association with higher incidence of chronic pain  <18 kg/m2 Underweight   18.5-24.9 kg/m2 Ideal body weight   25-29.9 kg/m2 Overweight Increased incidence by 20%  30-34.9 kg/m2 Obese (Class I) Increased incidence by 68%  35-39.9 kg/m2 Severe obesity (Class II) Increased incidence by 136%  >40 kg/m2 Extreme obesity (Class III) Increased incidence by 254%   BMI Readings from Last 4 Encounters:  09/23/17  26.63 kg/m  08/16/17 25.44 kg/m  07/21/17 25.82 kg/m  07/14/17 25.82 kg/m   Wt Readings from Last 4 Encounters:  09/23/17 165 lb (74.8 kg)  08/16/17 160 lb (72.6 kg)  07/21/17 160 lb (72.6 kg)  07/14/17 160 lb (72.6 kg)  Psych/Mental status: Alert, oriented x 3 (person, place, & time)       Eyes: PERLA Respiratory: No evidence of acute respiratory distress  Cervical Spine Area Exam  Skin & Axial Inspection: No masses, redness, edema, swelling, or associated skin lesions Alignment: Symmetrical Functional ROM: Unrestricted ROM      Stability: No instability detected Muscle Tone/Strength: Functionally intact. No obvious neuro-muscular anomalies detected. Sensory (Neurological): Unimpaired Palpation: No palpable anomalies              Upper Extremity (UE) Exam    Side: Right upper extremity  Side: Left upper extremity  Skin & Extremity Inspection: Skin color, temperature, and hair growth are WNL. No peripheral edema or cyanosis. No masses, redness, swelling, asymmetry, or associated skin lesions. No contractures.  Skin & Extremity Inspection: Skin color, temperature, and hair growth are WNL. No peripheral edema or cyanosis. No masses, redness, swelling, asymmetry, or associated skin lesions. No contractures.  Functional ROM: Unrestricted ROM          Functional ROM: Unrestricted ROM          Muscle Tone/Strength: Functionally intact. No obvious neuro-muscular anomalies detected.  Muscle Tone/Strength: Functionally intact. No obvious neuro-muscular anomalies detected.  Sensory (Neurological): Unimpaired          Sensory (Neurological): Unimpaired          Palpation: No palpable anomalies              Palpation: No palpable anomalies              Specialized Test(s): Deferred         Specialized Test(s): Deferred          Thoracic Spine Area Exam  Skin &  Axial Inspection: No masses, redness, or swelling Alignment: Symmetrical Functional ROM: Unrestricted ROM Stability: No  instability detected Muscle Tone/Strength: Functionally intact. No obvious neuro-muscular anomalies detected. Sensory (Neurological): Unimpaired Muscle strength & Tone: No palpable anomalies   Lumbar Spine Area Exam  Skin & Axial Inspection:Well healed scar from previous spine surgery detected Alignment:Symmetrical Functional BPZ:WCHENIDPO ROM, bilaterally, lumbar extension specifically Stability:No instability detected Muscle Tone/Strength:Functionally intact. No obvious neuro-muscular anomalies detected. Sensory (Neurological):Dermatomal pain pattern Palpation:Complains of area being tender to palpationBilateral Fist Percussion Test Provocative Tests: Lumbar Hyperextension and rotation test:Positivebilaterally for facet joint pain.right greater than left Lumbar Lateral bending test:Positiveipsilateral radicular pain, on the right. Positive for right-sided foraminal stenosis. Patrick's Maneuver:Positivefor bilateral S-I arthralgia  Gait & Posture Assessment  Ambulation:Limited Gait:Antalgic Posture:WNL  Lower Extremity Exam    Side:Right lower extremity  Side:Left lower extremity  Skin & Extremity Inspection:Skin color, temperature, and hair growth are WNL. No peripheral edema or cyanosis. No masses, redness, swelling, asymmetry, or associated skin lesions. No contractures.  Skin & Extremity Inspection:Skin color, temperature, and hair growth are WNL. No peripheral edema or cyanosis. No masses, redness, swelling, asymmetry, or associated skin lesions. No contractures.  Functional EUM:PNTIRWERXVQM ROM  Functional GQQ:[id number] ROM  Muscle Tone/Strength:Functionally intact. No obvious neuro-muscular anomalies detected.  Muscle Tone/Strength:Functionally intact. No obvious neuro-muscular anomalies detected.  Sensory (Neurological):Unimpaired  Sensory (Neurological):Unimpaired  Palpation:No palpable anomalies   Palpation:No palpable anomalies     Assessment  Primary Diagnosis & Pertinent Problem List: The primary encounter diagnosis was Lumbar radiculopathy. Diagnoses of S/P lumbar fusion, Lumbar degenerative disc disease, Idiopathic Parkinson's disease ([place]), and Depression, unspecified depression type were also pertinent to this visit.  Status Diagnosis  Persistent Persistent Persistent 1. Lumbar radiculopathy   2. S/P lumbar fusion   3. Lumbar degenerative disc disease   4. Idiopathic Parkinson's disease ([place])   5. Depression, unspecified depression type     General Recommendations: The pain condition that the patient suffers from is best treated with a multidisciplinary approach that involves an increase in physical activity to prevent de-conditioning and worsening of the pain cycle, as well as psychological counseling (formal and/or informal) to address the co-morbid psychological affects of pain. Treatment will often involve judicious use of pain medications and interventional procedures to decrease the pain, allowing the patient to participate in the physical activity that will ultimately produce long-lasting pain reductions. The goal of the multidisciplinary approach is to return the patient to a higher level of overall function and to restore their ability to perform activities of daily living.  67 year old female with a history of Parkinson's disease, follicular lymphoma status post chemotherapy, low back pain that radiates into bilateral legs, right greater than left status post L1/L2 interbody fusion in November 2017 status post epidural steroid injections and sacroiliac joint injectionwhich have not been very effective for her chronic radicular pain. Patient is a poor historian. She has memory recall issues and cognitive issues secondary to her Parkinson's. 11 of HPI was obtained from the patient's husband.   At previous visits, I have had extensive discussion with the  patient's about the risks and benefits of spinal cord stimulation. I reviewed the patient's lumbar MRI with the patient and her husband. I also used a spine model to review spinal cord stimulator electrodes and the pulse generator. I reviewed details about workup for the trial which includes thoracic MRI to rule out thoracic canal stenosis, psychological evaluation regarding suitability for SCS trial and implant.   I do have concerns regarding suitability  of spinal cord stimulator trial in this patient.First off the patient has memory issues that during my evaluation were fairly prominent. She was unable to recall the dates of her previous interventions and she often had alternating stories about her pain symptoms and the associated relief that she has received with previous interventions. During our conversations, her husband would correct her on many occasions. Her cognitive status may limit her ability to fully understand the utility and perceived benefits of spinal cord stimulation. Patient has very poor memory recall. Furthermore I am concerned about the patient's constant movement which she states is secondary to her Parkinson's. For me to do the trial safely, I informed her that she would need to lie prone on our fluoroscopy table for 1-2 hours and that any movement during the trial could increase her risk of nerve injury and spinal cord injury. For the reasons states above, patient will not be a suitable candidate for SCS trial.   Patient returns today for medication management.  She was unable to tolerate increased dose of gabapentin which resulted in sedation and abnormal dreams.  She does not find 100 mg of gabapentin helpful although this is the lowest dose that does not result in side effects of sedation.  Patient has tried amitriptyline in the past which resulted in side effects of blurry vision and dry mouth.  This was many years ago.  I discussed trialing Lyrica.  Patient is  instructed to start 25 mg nightly for 2 weeks and then increase to 25 mg twice daily.  Patient will also receive intramuscular Norflex and Toradol for muscular pain that she is expressing today.  Plan: -Stop gabapentin.  Start Lyrica as below. If effective can titrate up to 50-75 mg TID at next visit. -We will focus on non-opioid analgesics for pain management --Not a candidate for spinal cord stimulation for reasons listed above (Concern about the patient's mental status, memory recall, cognitive state as it pertains to an implantable therapy such as spinal cord stimulation. Concern about patient's continuous movement which may preclude safely doing the trial. Patient will also need to limit her bending, lifting, twisting while trial is in place which I am also afraid that she will not be able to do given her Parkinson symptoms)  Plan of Care  Pharmacotherapy (Medications Ordered): Meds ordered this encounter  Medications  . pregabalin (LYRICA) 25 MG capsule    Sig: 25 mg nightly for 2 weeks then 25 mg twice daily.    Dispense:  60 capsule    Refill:  1    Do not place this medication, or any other prescription from our practice, on "Automatic Refill". Patient may have prescription filled one day early if pharmacy is closed on scheduled refill date.  . orphenadrine (NORFLEX) injection 30 mg  . ketorolac (TORADOL) 30 MG/ML injection 30 mg   Time Note: Greater than 50% of the 25 minute(s) of face-to-face time spent with Ms. Haile, was spent in counseling/coordination of care regarding: the appropriate use of the pain scale, the treatment plan, treatment alternatives, realistic expectations, the goals of pain management (increased in functionality) and the importance of providing [place] with accurate post-procedure information.  Provider-requested follow-up: Return in about 4 weeks (around 10/21/2017) for MM with Crystal.  Future Appointments  Date Time Provider [place]  10/20/2017 10:45  AM Vevelyn Francois, NP [facility] None  11/19/2017 11:00 AM [facility] None  11/19/2017 11:30 AM Cammie Sickle, MD [facility] None    Primary  Care Physician: Cletis Athens, MD Location: [facility] Facility Note by: Gillis Santa, M.D Date: 09/23/2017; Time: 3:04 PM  Patient Instructions  1.  Stop gabapentin 2.  Start Lyrica 25 mg nightly for 2 weeks and increase to 25 mg twice daily. 3.  Intramuscular Toradol and Norflex today. 4.  Follow-up in 4-6 weeks for medication management.

## 2017-09-24 ENCOUNTER — Telehealth: Payer: Self-pay | Admitting: Student in an Organized Health Care Education/Training Program

## 2017-09-24 ENCOUNTER — Telehealth: Payer: Self-pay | Admitting: *Deleted

## 2017-09-24 NOTE — Telephone Encounter (Signed)
It was noticed that patient left her prescription for Lyrical here. I called her to notify her of this, and called it in to [facility].

## 2017-09-24 NOTE — Telephone Encounter (Signed)
Patient was here 09-23-17 for med eval, was supposed to have a med called to pharmacy. Pharmacy does not have the med at this time and they are leaving to go out of town at [place]. Please call asap to get this called in.

## 2017-09-30 ENCOUNTER — Telehealth: Payer: Self-pay | Admitting: Student in an Organized Health Care Education/Training Program

## 2017-09-30 NOTE — Telephone Encounter (Signed)
Increased pain in leg, please call to discuss

## 2017-10-01 DIAGNOSIS — R200 Anesthesia of skin: Secondary | ICD-10-CM | POA: Diagnosis not present

## 2017-10-01 DIAGNOSIS — G8929 Other chronic pain: Secondary | ICD-10-CM | POA: Diagnosis not present

## 2017-10-01 DIAGNOSIS — E669 Obesity, unspecified: Secondary | ICD-10-CM | POA: Diagnosis not present

## 2017-10-01 DIAGNOSIS — R202 Paresthesia of skin: Secondary | ICD-10-CM | POA: Diagnosis not present

## 2017-10-01 DIAGNOSIS — G4752 REM sleep behavior disorder: Secondary | ICD-10-CM | POA: Diagnosis not present

## 2017-10-01 DIAGNOSIS — G20 Parkinson's disease: Secondary | ICD-10-CM | POA: Diagnosis not present

## 2017-10-01 DIAGNOSIS — G479 Sleep disorder, unspecified: Secondary | ICD-10-CM | POA: Diagnosis not present

## 2017-10-01 DIAGNOSIS — M5441 Lumbago with sciatica, right side: Secondary | ICD-10-CM | POA: Diagnosis not present

## 2017-10-01 NOTE — Telephone Encounter (Signed)
Patient states she got a Toradol/ Norflex injection when she was here last and has had pain where the injection was and down her right leg.  Sometimes it is so bad that she cant walk.  Pain is in the front and the side of her leg.  Asked patient was this a new pain and she states no, but it was worse.  States that she thinks the injection made it worse.  Patient is taking Lyrica as prescribed but feels it is not helping at all.  Patient states she is going out of town next week and would like to see if there is something to do for this pain.  Informed patient that MD was not in the office today, but to go to the ED if she felt the need to be seen.  Will forward this ti Dr Holley Raring for review on Monday.

## 2017-10-07 DIAGNOSIS — H43811 Vitreous degeneration, right eye: Secondary | ICD-10-CM | POA: Diagnosis not present

## 2017-10-20 ENCOUNTER — Ambulatory Visit: Payer: Medicare Other | Attending: Nurse Practitioner | Admitting: Nurse Practitioner

## 2017-10-20 ENCOUNTER — Encounter: Payer: Self-pay | Admitting: Nurse Practitioner

## 2017-10-20 VITALS — BP 112/69 | HR 84 | Temp 97.7°F | Resp 16 | Ht 66.5 in | Wt 165.0 lb

## 2017-10-20 DIAGNOSIS — M7918 Myalgia, other site: Secondary | ICD-10-CM | POA: Diagnosis not present

## 2017-10-20 DIAGNOSIS — G8929 Other chronic pain: Secondary | ICD-10-CM | POA: Insufficient documentation

## 2017-10-20 DIAGNOSIS — Z79899 Other long term (current) drug therapy: Secondary | ICD-10-CM | POA: Insufficient documentation

## 2017-10-20 DIAGNOSIS — Z87891 Personal history of nicotine dependence: Secondary | ICD-10-CM | POA: Diagnosis not present

## 2017-10-20 DIAGNOSIS — Z885 Allergy status to narcotic agent status: Secondary | ICD-10-CM | POA: Diagnosis not present

## 2017-10-20 DIAGNOSIS — M79604 Pain in right leg: Secondary | ICD-10-CM | POA: Diagnosis not present

## 2017-10-20 DIAGNOSIS — G20 Parkinson's disease: Secondary | ICD-10-CM | POA: Insufficient documentation

## 2017-10-20 DIAGNOSIS — M5441 Lumbago with sciatica, right side: Secondary | ICD-10-CM | POA: Insufficient documentation

## 2017-10-20 DIAGNOSIS — G894 Chronic pain syndrome: Secondary | ICD-10-CM

## 2017-10-20 DIAGNOSIS — Z9889 Other specified postprocedural states: Secondary | ICD-10-CM | POA: Insufficient documentation

## 2017-10-20 DIAGNOSIS — D367 Benign neoplasm of other specified sites: Secondary | ICD-10-CM | POA: Insufficient documentation

## 2017-10-20 MED ORDER — BUPIVACAINE HCL (PF) 0.25 % IJ SOLN
INTRAMUSCULAR | Status: AC
  Filled 2017-10-20: qty 30

## 2017-10-20 MED ORDER — PREGABALIN 50 MG PO CAPS: ORAL_CAPSULE | ORAL | 0 refills | Status: DC

## 2017-10-20 MED ORDER — BUPIVACAINE HCL (PF) 0.25 % IJ SOLN
20.0000 mL | Freq: Once | INTRAMUSCULAR | Status: AC
Start: 2017-10-20 — End: 2017-10-20
  Administered 2017-10-20: 20 mL via PERINEURAL

## 2017-10-20 NOTE — Progress Notes (Signed)
Patient's Name: Sarah Donaldson  MRN: [id number]  Referring Provider: Cletis Athens, MD  DOB: 01/28/1951  PCP: Cletis Athens, MD  DOS: 10/20/2017  Note by: Vevelyn Francois NP  Service setting: Ambulatory outpatient  Specialty: Interventional Pain Management  Location: [facility] (AMB) Pain Management Facility    Patient type: Established    Primary Reason(s) for Visit: Encounter for prescription drug management. (Level of risk: moderate)  CC: Back Pain (lower worse on the right side)  HPI  Sarah Donaldson is a 67 y.o. year old, female patient, who comes today for a medication management evaluation. She has Anxiety; Back ache; Low back pain with sciatica; Colon polyp; Clinical depression; Difficulty hearing; H/O adenomatous polyp of colon; Hypercholesteremia; Adiposity; Idiopathic Parkinson's disease ([place]); REM sleep behavior disorder; Detached retina; Disordered sleep; Has a tremor; Hallux limitus; Lymphoma ([place]); Follicular lymphoma of extranodal and solid organ sites [facility]); Benign neoplasm of hand; Hearing loss; Numbness and tingling; Sleep difficulties; Chronic pain syndrome; Chronic pain of right lower extremity; and Chronic myofascial pain on their problem list. Her primarily concern today is the Back Pain (lower worse on the right side)  Pain Assessment: Location: Lower, Left, Right Back Radiating: down right leg to the ankle  Onset: More than a month ago Duration: Chronic pain Quality: Numbness, Pins and needles, Constant, Discomfort, Aching, Burning Severity: 8 /10 (self-reported pain score)  Note: Reported level is compatible with observation. Clinically the patient looks like a 3/10 A 3/10 is viewed as "Moderate" and described as significantly interfering with activities of daily living (ADL). It becomes difficult to feed, bathe, get dressed, get on and off the toilet or to perform personal hygiene functions. Difficult to get in and out of bed or a chair without assistance. Very distracting. With  effort, it can be ignored when deeply involved in activities. Information on the proper use of the pain scale provided to the patient today. When using our objective Pain Scale, levels between 6 and 10/10 are said to belong in an emergency room, as it progressively worsens from a 6/10, described as severely limiting, requiring emergency care not usually available at an outpatient pain management facility. At a 6/10 level, communication becomes difficult and requires great effort. Assistance to reach the emergency department may be required. Facial flushing and profuse sweating along with potentially dangerous increases in heart rate and blood pressure will be evident. Effect on ADL: pain and parkinsons have affected her gait.  difficult to walk, unsteady gait Timing: Constant Modifying factors: tylenol or ibuprofen  Sarah Donaldson was last scheduled for an appointment on Visit date not found for medication management. During today's appointment we reviewed Sarah Donaldson's chronic pain status, as well as her outpatient medication regimen. She was started on Lyrica 25 mg BID one month ago. She does not feel like it is effective however she feels like does may her drowsy. She admits that she is taking one tab at night and then in the am. She is concern that she continues to have back pain that is worse on the right. She feels like there is one spot that is giving her increased pain.  She admits that she does have pain that goes down her leg to the knee. She has numbness tingling and burning. She has failed gabapentin in the past. She had also failed LESI x2. She felt like one injection was helpful.   The patient  reports that she does not use drugs. Her body mass index is 26.23 kg/m.  Further  details on both, my assessment(s), as well as the proposed treatment plan, please see below.  Controlled Substance Pharmacotherapy Assessment REMS (Risk Evaluation and Mitigation Strategy)  Analgesic: None MME/day: 0  mg/day.  Janett Billow, RN  10/20/2017 11:06 AM  Sign at close encounter Safety precautions to be maintained throughout the outpatient stay will include: orient to surroundings, keep bed in low position, maintain call bell within reach at all times, provide assistance with transfer out of bed and ambulation.    Pharmacokinetics: Liberation and absorption (onset of action): WNL Distribution (time to peak effect): WNL Metabolism and excretion (duration of action): WNL         Pharmacodynamics: Desired effects: Analgesia: Sarah Donaldson reports >50% benefit. Functional ability: Patient reports that medication allows her to accomplish basic ADLs Clinically meaningful improvement in function (CMIF): Sustained CMIF goals met Perceived effectiveness: Described as relatively effective, allowing for increase in activities of daily living (ADL) Undesirable effects: Side-effects or Adverse reactions: None reported Monitoring: [place] PMP: Online review of the past 71-monthperiod conducted. Compliant with practice rules and regulations Last UDS on record: No results found for: SUMMARY UDS interpretation: Compliant          Medication Assessment Form: Reviewed. Patient indicates being compliant with therapy Treatment compliance: Compliant Risk Assessment Profile: Aberrant behavior: See prior evaluations. None observed or detected today Comorbid factors increasing risk of overdose: See prior notes. No additional risks detected today Risk of substance use disorder (SUD): Low Opioid Risk Tool - 10/20/17 1106      Family History of Substance Abuse   Alcohol  Positive Female    Illegal Drugs  Negative    Rx Drugs  Negative      Personal History of Substance Abuse   Alcohol  Negative    Illegal Drugs  Negative    Rx Drugs  Negative      Psychological Disease   Psychological Disease  Negative    Depression  Negative      Total Score   Opioid Risk Tool Scoring  1    Opioid Risk Interpretation   Low Risk      ORT Scoring interpretation table:  Score <3 = Low Risk for SUD  Score between 4-7 = Moderate Risk for SUD  Score >8 = High Risk for Opioid Abuse   Risk Mitigation Strategies:  Patient Counseling: Covered Patient-Prescriber Agreement (PPA): Present and active  Notification to other healthcare providers: Done  Pharmacologic Plan: No change in therapy, at this time.             Laboratory Chemistry  Inflammation Markers (CRP: Acute Phase) (ESR: Chronic Phase) Lab Results  Component Value Date   ESRSEDRATE 4 06/07/2013                         Rheumatology Markers No results found for: RF, ANA, LABURIC, URICUR, LYMEIGGIGMAB, LYMEABIGMQN              Renal Function Markers Lab Results  Component Value Date   BUN 28 (H) 05/21/2017   CREATININE 0.69 05/21/2017   GFRAA >60 05/21/2017   GFRNONAA >60 05/21/2017                 Hepatic Function Markers Lab Results  Component Value Date   AST 20 05/21/2017   ALT <5 (L) 05/21/2017   ALBUMIN 3.8 05/21/2017   ALKPHOS 88 05/21/2017  Electrolytes Lab Results  Component Value Date   NA 141 05/21/2017   K 4.1 05/21/2017   CL 107 05/21/2017   CALCIUM 9.3 05/21/2017                        Neuropathy Markers No results found for: VITAMINB12, FOLATE, HGBA1C, HIV               Bone Pathology Markers No results found for: VD25OH, VD125OH2TOT, YT0160FU9, NA3557DU2, 25OHVITD1, 25OHVITD2, 25OHVITD3, TESTOFREE, TESTOSTERONE                       Coagulation Parameters Lab Results  Component Value Date   PLT 189 05/21/2017                 Cardiovascular Markers Lab Results  Component Value Date   HGB 14.0 05/21/2017   HCT 42.2 05/21/2017                 CA Markers No results found for: CEA, CA125, LABCA2               Note: Lab results reviewed.  Recent Diagnostic Imaging Results  DG C-Arm 1-60 Min-No Report Fluoroscopy was utilized by the requesting physician.  No radiographic   interpretation.   Complexity Note: Imaging results reviewed. Results shared with Sarah Donaldson, using Layman's terms.                         Meds   Current Outpatient Medications:  .  Acetaminophen (TYLENOL ARTHRITIS PAIN PO), Take by mouth 2 (two) times daily., Disp: , Rfl:  .  carbidopa-levodopa (SINEMET IR) 25-100 MG per tablet, TAKE 2 tablets in a.m. and 1 tab BY MOUTH every 4 hrs, Disp: , Rfl: 0 .  clonazePAM (KLONOPIN) 1 MG tablet, Take 2 mg by mouth daily. , Disp: , Rfl:  .  furosemide (LASIX) 40 MG tablet, Take 20 mg by mouth as needed for fluid. Twice a week, Disp: , Rfl:  .  meloxicam (MOBIC) 7.5 MG tablet, Take 7.5 mg by mouth daily. 1 or 2 tablets daily, Disp: , Rfl:  .  neomycin-polymyxin b-dexamethasone (MAXITROL) 3.5-10000-0.1 OINT, Place 1 application into the left eye at bedtime., Disp: , Rfl: 2 .  omeprazole (PRILOSEC) 20 MG capsule, Take 20 mg by mouth daily., Disp: , Rfl: 3 .  pramipexole (MIRAPEX) 1.5 MG tablet, Take 1.5 mg by mouth 3 (three) times daily., Disp: , Rfl: 5 .  prednisoLONE acetate (PRED FORTE) 1 % ophthalmic suspension, Place 1 drop into the left eye daily., Disp: , Rfl: 5 .  pregabalin (LYRICA) 50 MG capsule, 50 mg 1 tab twice daily for one week then increase the three times daily, Disp: 90 capsule, Rfl: 0 .  promethazine (PHENERGAN) 12.5 MG tablet, Take 12.5 mg by mouth every 6 (six) hours as needed for nausea or vomiting., Disp: , Rfl:  .  ranitidine (ZANTAC) 150 MG capsule, Take 150 mg by mouth every evening., Disp: , Rfl:  .  selegiline (ELDEPRYL) 5 MG capsule, TAKE 1 CAPSULE BY MOUTH WITH BREAKFAST AND LUNCH., Disp: , Rfl: 1 .  traZODone (DESYREL) 50 MG tablet, TAKE 1-2 TABLETS NIGHTLY AS NEEDED FOR SLEEP., Disp: , Rfl: 1  ROS  Constitutional: Denies any fever or chills Gastrointestinal: No reported hemesis, hematochezia, vomiting, or acute GI distress Musculoskeletal: Denies any acute onset joint swelling, redness, loss of ROM, or  weakness Neurological: No reported episodes of acute onset apraxia, aphasia, dysarthria, agnosia, amnesia, paralysis, loss of coordination, or loss of consciousness  Allergies  Sarah Donaldson is allergic to codeine sulfate.  [place]  Drug: Sarah Donaldson  reports that she does not use drugs. Alcohol:  reports that she drinks alcohol. Tobacco:  reports that she has quit smoking. she has never used smokeless tobacco. Medical:  has a past medical history of Cancer ([place]), Parkinson disease ([place]), and Parkinson disease ([place]). Surgical: Ms. Warmuth  has a past surgical history that includes Retinal detachment surgery; Bunionectomy; Nasal septum surgery; and Back surgery. Family: family history includes Heart disease in her mother.  Constitutional Exam  General appearance: Well nourished, well developed, and well hydrated. In no apparent acute distress Vitals:   10/20/17 1053  BP: 112/69  Pulse: 84  Resp: 16  Temp: 97.7 F (36.5 C)  TempSrc: Oral  SpO2: 95%  Weight: 165 lb (74.8 kg)  Height: 5' 6.5" (1.689 m)   BMI Assessment: Estimated body mass index is 26.23 kg/m as calculated from the following:   Height as of this encounter: 5' 6.5" (1.689 m).   Weight as of this encounter: 165 lb (74.8 kg). Psych/Mental status: Alert, oriented x 3 (person, place, & time)       Eyes: PERLA Respiratory: No evidence of acute respiratory distress  Lumbar Spine Area Exam  Skin & Axial Inspection: Well healed scar from previous spine surgery detected Alignment: Symmetrical Functional ROM: Guarding      Stability: No instability detected Muscle Tone/Strength: Increased muscle tone over affected area Sensory (Neurological): Unimpaired Palpation: Complains of area being tender to palpation       Palpable muscular nodule Provocative Tests Lumbar Hyperextension and rotation test: Positive bilaterally for facet joint pain. Lumbar Lateral bending test: Positive ipsilateral radicular pain, on the right. Positive  for right-sided foraminal stenosis. Patrick's Maneuver: evaluation deferred today                    Gait & Posture Assessment  Ambulation: Unassisted Gait: Awkward Posture: Flat back with slight sway   Lower Extremity Exam    Side: Right lower extremity  Side: Left lower extremity  Skin & Extremity Inspection: Skin color, temperature, and hair growth are WNL. No peripheral edema or cyanosis. No masses, redness, swelling, asymmetry, or associated skin lesions. No contractures.  Skin & Extremity Inspection: Skin color, temperature, and hair growth are WNL. No peripheral edema or cyanosis. No masses, redness, swelling, asymmetry, or associated skin lesions. No contractures.  Functional ROM: Unrestricted ROM          Functional ROM: Unrestricted ROM          Muscle Tone/Strength: Functionally intact. No obvious neuro-muscular anomalies detected.  Muscle Tone/Strength: Functionally intact. No obvious neuro-muscular anomalies detected.  Sensory (Neurological): Unimpaired  Sensory (Neurological): Unimpaired  Palpation: No palpable anomalies  Palpation: No palpable anomalies   Assessment  Primary Diagnosis & Pertinent Problem List: The primary encounter diagnosis was Chronic myofascial pain. Diagnoses of Chronic bilateral low back pain with right-sided sciatica, Chronic pain of right lower extremity, and Chronic pain syndrome were also pertinent to this visit.  Status Diagnosis  Controlled Controlled Controlled 1. Chronic myofascial pain   2. Chronic bilateral low back pain with right-sided sciatica   3. Chronic pain of right lower extremity   4. Chronic pain syndrome     Problems updated and reviewed during this visit: Problem  Chronic Pain Syndrome  Chronic Pain  of Right Lower Extremity  Chronic Myofascial Pain  Benign Neoplasm of Hand  Numbness and Tingling  Sleep Difficulties  Hearing Loss   Plan of Care  Pharmacotherapy (Medications Ordered): Meds ordered this encounter   Medications  . pregabalin (LYRICA) 50 MG capsule    Sig: 50 mg 1 tab twice daily for one week then increase the three times daily    Dispense:  90 capsule    Refill:  0    Do not place this medication, or any other prescription from our practice, on "Automatic Refill". Patient may have prescription filled one day early if pharmacy is closed on scheduled refill date.    Order Specific Question:   Supervising Provider    Answer:   Milinda Pointer [phone]  . bupivacaine (PF) (MARCAINE) 0.25 % injection 20 mL    Preservative-free (MPF), single use vial.   New Prescriptions   No medications on file   Medications administered today: We administered bupivacaine (PF). Lab-work, procedure(s), and/or referral(s): Orders Placed This Encounter  Procedures  . TRIGGER POINT INJECTION   Imaging and/or referral(s): None  Interventional therapies: Planned, scheduled, and/or pending:   Not at this time. Trigger point    Considering:   Trigger Point injections prn  Provider-requested follow-up: Return in about 4 weeks (around 11/17/2017) for MedMgmt with Me Donella Stade Edison Pace).  Future Appointments  Date Time Provider [place]  11/16/2017 11:30 AM Vevelyn Francois, NP [facility] None  11/19/2017 11:00 AM [facility] None  11/19/2017 11:30 AM Cammie Sickle, MD [facility] None   Primary Care Physician: Cletis Athens, MD Location: [facility] Management Facility Note by: Vevelyn Francois NP Date: 10/20/2017; Time: 3:20 PM  Pain Score Disclaimer: We use the NRS-11 scale. This is a self-reported, subjective measurement of pain severity with only modest accuracy. It is used primarily to identify changes within a particular patient. It must be understood that outpatient pain scales are significantly less accurate that those used for research, where they can be applied under ideal controlled circumstances with minimal exposure to variables. In reality, the score is  likely to be a combination of pain intensity and pain affect, where pain affect describes the degree of emotional arousal or changes in action readiness caused by the sensory experience of pain. Factors such as social and work situation, setting, emotional state, anxiety levels, expectation, and prior pain experience may influence pain perception and show large inter-individual differences that may also be affected by time variables.  Patient instructions provided during this appointment: Patient Instructions   ____________________________________________________________________________________________  Medication Rules  Applies to: All patients receiving prescriptions (written or electronic).  Pharmacy of record: Pharmacy where electronic prescriptions will be sent. If written prescriptions are taken to a different pharmacy, please inform the nursing staff. The pharmacy listed in the electronic medical record should be the one where you would like electronic prescriptions to be sent.  Prescription refills: Only during scheduled appointments. Applies to both, written and electronic prescriptions.  NOTE: The following applies primarily to controlled substances (Opioid* Pain Medications).   Patient's responsibilities: 1. Pain Pills: Bring all pain pills to every appointment (except for procedure appointments). 2. Pill Bottles: Bring pills in original pharmacy bottle. Always bring newest bottle. Bring bottle, even if empty. 3. Medication refills: You are responsible for knowing and keeping track of what medications you need refilled. The day before your appointment, write a list of all prescriptions that need to be refilled. Bring that list to your appointment  and give it to the admitting nurse. Prescriptions will be written only during appointments. If you forget a medication, it will not be "Called in", "Faxed", or "electronically sent". You will need to get another appointment to get these  prescribed. 4. Prescription Accuracy: You are responsible for carefully inspecting your prescriptions before leaving our office. Have the discharge nurse carefully go over each prescription with you, before taking them home. Make sure that your name is accurately spelled, that your address is correct. Check the name and dose of your medication to make sure it is accurate. Check the number of pills, and the written instructions to make sure they are clear and accurate. Make sure that you are given enough medication to last until your next medication refill appointment. 5. Taking Medication: Take medication as prescribed. Never take more pills than instructed. Never take medication more frequently than prescribed. Taking less pills or less frequently is permitted and encouraged, when it comes to controlled substances (written prescriptions).  6. Inform other Doctors: Always inform, all of your healthcare providers, of all the medications you take. 7. Pain Medication from other Providers: You are not allowed to accept any additional pain medication from any other Doctor or Healthcare provider. There are two exceptions to this rule. (see below) In the event that you require additional pain medication, you are responsible for notifying us, as stated below. 8. Medication Agreement: You are responsible for carefully reading and following our Medication Agreement. This must be signed before receiving any prescriptions from our practice. Safely store a copy of your signed Agreement. Violations to the Agreement will result in no further prescriptions. (Additional copies of our Medication Agreement are available upon request.) 9. Laws, Rules, & Regulations: All patients are expected to follow all Federal and [employer], Rules, [employer]. Ignorance of the Laws does not constitute a valid excuse. The use of any illegal substances is prohibited. 10. Adopted CDC guidelines & recommendations: Target dosing  levels will be at or below 60 MME/day. Use of benzodiazepines** is not recommended.  Exceptions: There are only two exceptions to the rule of not receiving pain medications from other Healthcare Providers. 1. Exception #1 (Emergencies): In the event of an emergency (i.e.: accident requiring emergency care), you are allowed to receive additional pain medication. However, you are responsible for: As soon as you are able, call our office [phone], at any time of the day or night, and leave a message stating your name, the date and nature of the emergency, and the name and dose of the medication prescribed. In the event that your call is answered by a member of our staff, make sure to document and save the date, time, and the name of the person that took your information.  2. Exception #2 (Planned Surgery): In the event that you are scheduled by another doctor or dentist to have any type of surgery or procedure, you are allowed (for a period no longer than 30 days), to receive additional pain medication, for the acute post-op pain. However, in this case, you are responsible for picking up a copy of our "Post-op Pain Management for Surgeons" handout, and giving it to your surgeon or dentist. This document is available at our office, and does not require an appointment to obtain it. Simply go to our office during business hours (Monday-Thursday from 8:00 AM to 4:00 PM) (Friday 8:00 AM to 12:00 Noon) or if you have a scheduled appointment with [place], prior to your surgery, and ask  for it by name. In addition, you will need to provide [place] with your name, name of your surgeon, type of surgery, and date of procedure or surgery.  *Opioid medications include: morphine, codeine, oxycodone, oxymorphone, hydrocodone, hydromorphone, meperidine, tramadol, tapentadol, buprenorphine, fentanyl, methadone. **Benzodiazepine medications include: diazepam (Valium), alprazolam (Xanax), clonazepam (Klonopine), lorazepam (Ativan),  clorazepate (Tranxene), chlordiazepoxide (Librium), estazolam (Prosom), oxazepam (Serax), temazepam (Restoril), triazolam (Halcion) (Last updated: 10/07/2017) ____________________________________________________________________________________________   ____________________________________________________________________________________________  Pain Scale  Introduction: The pain score used by this practice is the Verbal Numerical Rating Scale (VNRS-11). This is an 11-point scale. It is for adults and children 10 years or older. There are significant differences in how the pain score is reported, used, and applied. Forget everything you learned in the past and learn this scoring system.  General Information: The scale should reflect your current level of pain. Unless you are specifically asked for the level of your worst pain, or your average pain. If you are asked for one of these two, then it should be understood that it is over the past 24 hours.  Basic Activities of Daily Living (ADL): Personal hygiene, dressing, eating, transferring, and using restroom.  Instructions: Most patients tend to report their level of pain as a combination of two factors, their physical pain and their psychosocial pain. This last one is also known as "suffering" and it is reflection of how physical pain affects you socially and psychologically. From now on, report them separately. From this point on, when asked to report your pain level, report only your physical pain. Use the following table for reference.  Pain Clinic Pain Levels (0-5/10)  Pain Level Score  Description  No Pain 0   Mild pain 1 Nagging, annoying, but does not interfere with basic activities of daily living (ADL). Patients are able to eat, bathe, get dressed, toileting (being able to get on and off the toilet and perform personal hygiene functions), transfer (move in and out of bed or a chair without assistance), and maintain continence (able to  control bladder and bowel functions). Blood pressure and heart rate are unaffected. A normal heart rate for a healthy adult ranges from 60 to 100 bpm (beats per minute).   Mild to moderate pain 2 Noticeable and distracting. Impossible to hide from other people. More frequent flare-ups. Still possible to adapt and function close to normal. It can be very annoying and may have occasional stronger flare-ups. With discipline, patients may get used to it and adapt.   Moderate pain 3 Interferes significantly with activities of daily living (ADL). It becomes difficult to feed, bathe, get dressed, get on and off the toilet or to perform personal hygiene functions. Difficult to get in and out of bed or a chair without assistance. Very distracting. With effort, it can be ignored when deeply involved in activities.   Moderately severe pain 4 Impossible to ignore for more than a few minutes. With effort, patients may still be able to manage work or participate in some social activities. Very difficult to concentrate. Signs of autonomic nervous system discharge are evident: dilated pupils (mydriasis); mild sweating (diaphoresis); sleep interference. Heart rate becomes elevated (>115 bpm). Diastolic blood pressure (lower number) rises above 100 mmHg. Patients find relief in laying down and not moving.   Severe pain 5 Intense and extremely unpleasant. Associated with frowning face and frequent crying. Pain overwhelms the senses.  Ability to do any activity or maintain social relationships becomes significantly limited. Conversation becomes difficult. Pacing back and forth  is common, as getting into a comfortable position is nearly impossible. Pain wakes you up from deep sleep. Physical signs will be obvious: pupillary dilation; increased sweating; goosebumps; brisk reflexes; cold, clammy hands and feet; nausea, vomiting or dry heaves; loss of appetite; significant sleep disturbance with inability to fall asleep or to  remain asleep. When persistent, significant weight loss is observed due to the complete loss of appetite and sleep deprivation.  Blood pressure and heart rate becomes significantly elevated. Caution: If elevated blood pressure triggers a pounding headache associated with blurred vision, then the patient should immediately seek attention at an urgent or emergency care unit, as these may be signs of an impending stroke.    Emergency Department Pain Levels (6-10/10)  Emergency Room Pain 6 Severely limiting. Requires emergency care and should not be seen or managed at an outpatient pain management facility. Communication becomes difficult and requires great effort. Assistance to reach the emergency department may be required. Facial flushing and profuse sweating along with potentially dangerous increases in heart rate and blood pressure will be evident.   Distressing pain 7 Self-care is very difficult. Assistance is required to transport, or use restroom. Assistance to reach the emergency department will be required. Tasks requiring coordination, such as bathing and getting dressed become very difficult.   Disabling pain 8 Self-care is no longer possible. At this level, pain is disabling. The individual is unable to do even the most "basic" activities such as walking, eating, bathing, dressing, transferring to a bed, or toileting. Fine motor skills are lost. It is difficult to think clearly.   Incapacitating pain 9 Pain becomes incapacitating. Thought processing is no longer possible. Difficult to remember your own name. Control of movement and coordination are lost.   The worst pain imaginable 10 At this level, most patients pass out from pain. When this level is reached, collapse of the autonomic nervous system occurs, leading to a sudden drop in blood pressure and heart rate. This in turn results in a temporary and dramatic drop in blood flow to the brain, leading to a loss of consciousness. Fainting is  one of the body's self defense mechanisms. Passing out puts the brain in a calmed state and causes it to shut down for a while, in order to begin the healing process.    Summary: 1. Refer to this scale when providing [place] with your pain level. 2. Be accurate and careful when reporting your pain level. This will help with your care. 3. Over-reporting your pain level will lead to loss of credibility. 4. Even a level of 1/10 means that there is pain and will be treated at our facility. 5. High, inaccurate reporting will be documented as "Symptom Exaggeration", leading to loss of credibility and suspicions of possible secondary gains such as obtaining more narcotics, or wanting to appear disabled, for fraudulent reasons. 6. Only pain levels of 5 or below will be seen at our facility. 7. Pain levels of 6 and above will be sent to the Emergency Department and the appointment cancelled. ____________________________________________________________________________________________

## 2017-10-20 NOTE — Progress Notes (Signed)
Safety precautions to be maintained throughout the outpatient stay will include: orient to surroundings, keep bed in low position, maintain call bell within reach at all times, provide assistance with transfer out of bed and ambulation.  

## 2017-10-20 NOTE — Progress Notes (Signed)
Procedure:       Anesthesia, Analgesia, Anxiolysis:  Type: Trigger Point Injection Level: Right lumbar paraspinal Laterality: RIGHT Position: Prone Target Muscle: right paraspinal muscles Region: Posterolateral  Lumbar area. Primary Purpose: Diagnostic  Type: Local Anesthesia Indication(s): Analgesia         Local Anesthetic: 0.25% Ropivacaine Route: Infiltration ([place]/IM) IV Access: Declined Sedation: Declined    Indications: Myofascial pain  Pain Score: Pre-procedure: 8 /10 Post-procedure: 7 /10  Pre-op Assessment:  Sarah Donaldson is a 67 y.o. (year old), female patient, seen today for interventional treatment. She  has a past surgical history that includes Retinal detachment surgery; Bunionectomy; Nasal septum surgery; and Back surgery. Sarah Donaldson has a current medication list which includes the following prescription(s): acetaminophen, carbidopa-levodopa, clonazepam, furosemide, meloxicam, neomycin-polymyxin b-dexamethasone, omeprazole, pramipexole, prednisolone acetate, pregabalin, promethazine, ranitidine, selegiline, and trazodone. Her primarily concern today is the Back Pain (lower worse on the right side)  Initial Vital Signs:  Pulse Rate: 84 Temp: 97.7 F (36.5 C) Resp: 16 BP: 112/69 SpO2: 95 %  BMI: Estimated body mass index is 26.23 kg/m as calculated from the following:   Height as of this encounter: 5' 6.5" (1.689 m).   Weight as of this encounter: 165 lb (74.8 kg).  Risk Assessment: Allergies: Reviewed. She is allergic to codeine sulfate.  Allergy Precautions: None required Coagulopathies: Reviewed. None identified.  Blood-thinner therapy: None at this time Active Infection(s): Reviewed. None identified. Sarah Donaldson is afebrile  Site Confirmation: Sarah Donaldson was asked to confirm the procedure and laterality before marking the site Procedure checklist: Completed Consent: Before the procedure and under the influence of no sedative(s), amnesic(s), or anxiolytics,  the patient was informed of the treatment options, risks and possible complications. To fulfill our ethical and legal obligations, as recommended by the American Medical Association's Code of Ethics, I have informed the patient of my clinical impression; the nature and purpose of the treatment or procedure; the risks, benefits, and possible complications of the intervention; the alternatives, including doing nothing; the risk(s) and benefit(s) of the alternative treatment(s) or procedure(s); and the risk(s) and benefit(s) of doing nothing. The patient was provided information about the general risks and possible complications associated with the procedure. These may include, but are not limited to: failure to achieve desired goals, infection, bleeding, organ or nerve damage, allergic reactions, paralysis, and death. In addition, the patient was informed of those risks and complications associated to the procedure, such as failure to decrease pain; infection; bleeding; organ or nerve damage with subsequent damage to sensory, motor, and/or autonomic systems, resulting in permanent pain, numbness, and/or weakness of one or several areas of the body; allergic reactions; (i.e.: anaphylactic reaction); and/or death. Furthermore, the patient was informed of those risks and complications associated with the medications. These include, but are not limited to: allergic reactions (i.e.: anaphylactic or anaphylactoid reaction(s)); adrenal axis suppression; blood sugar elevation that in diabetics may result in ketoacidosis or comma; water retention that in patients with history of congestive heart failure may result in shortness of breath, pulmonary edema, and decompensation with resultant heart failure; weight gain; swelling or edema; medication-induced neural toxicity; particulate matter embolism and blood vessel occlusion with resultant organ, and/or nervous system infarction; and/or aseptic necrosis of one or more  joints. Finally, the patient was informed that Medicine is not an exact science; therefore, there is also the possibility of unforeseen or unpredictable risks and/or possible complications that may result in a catastrophic outcome. The patient indicated having understood very  clearly. We have given the patient no guarantees and we have made no promises. Enough time was given to the patient to ask questions, all of which were answered to the patient's satisfaction. Sarah Donaldson has indicated that she wanted to continue with the procedure. Attestation: I, the ordering provider, attest that I have discussed with the patient the benefits, risks, side-effects, alternatives, likelihood of achieving goals, and potential problems during recovery for the procedure that I have provided informed consent. Date  Time: 10/20/2017 10:49 AM  Pre-Procedure Preparation:  Monitoring: As per clinic protocol. Respiration, ETCO2, SpO2, BP, heart rate and rhythm monitor placed and checked for adequate function Safety Precautions: Patient was assessed for positional comfort and pressure points before starting the procedure. Time-out: I initiated and conducted the "Time-out" before starting the procedure, as per protocol. The patient was asked to participate by confirming the accuracy of the "Time Out" information. Verification of the correct person, site, and procedure were performed and confirmed by me, the nursing staff, and the patient. "Time-out" conducted as per Joint Commission's Universal Protocol (UP.01.01.01). Time: 1215  Description of Procedure Process:   Prepping solution: ChloraPrep (2% chlorhexidine gluconate and 70% isopropyl alcohol) Safety Precautions: Aspiration looking for blood return was conducted prior to all injections. At no point did we inject any substances, as a needle was being advanced. No attempts were made at seeking any paresthesias. Safe injection practices and needle disposal techniques used.  Medications properly checked for expiration dates. SDV (single dose vial) medications used. Description of the Procedure: Protocol guidelines were followed. The patient was placed in position over the fluoroscopy table. The target area was identified and the area prepped in the usual manner. Skin desensitized using vapocoolant spray. Skin & deeper tissues infiltrated with local anesthetic. Appropriate amount of time allowed to pass for local anesthetics to take effect. The procedure needles were then advanced to the target area. Proper needle placement secured. Negative aspiration confirmed. Solution injected in intermittent fashion, asking for systemic symptoms every 0.5cc of injectate. The needles were then removed and the area cleansed, making sure to leave some of the prepping solution back to take advantage of its long term bactericidal properties.  Vitals:   10/20/17 1053  BP: 112/69  Pulse: 84  Resp: 16  Temp: 97.7 F (36.5 C)  TempSrc: Oral  SpO2: 95%  Weight: 165 lb (74.8 kg)  Height: 5' 6.5" (1.689 m)    Start Time: 1215 hrs. End Time: 1220 hrs. Materials:  Needle(s) Type: Epidural needle Gauge: 20G Length: 3.5-in Medication(s): Please see orders for medications and dosing details.  Approximately 13 trigger points injected in the right lateral lumbar region.  1 cc of 0.25% bupivacaine injected in each trigger point with needling done.

## 2017-10-20 NOTE — Patient Instructions (Addendum)
____________________________________________________________________________________________  Medication Rules  Applies to: All patients receiving prescriptions (written or electronic).  Pharmacy of record: Pharmacy where electronic prescriptions will be sent. If written prescriptions are taken to a different pharmacy, please inform the nursing staff. The pharmacy listed in the electronic medical record should be the one where you would like electronic prescriptions to be sent.  Prescription refills: Only during scheduled appointments. Applies to both, written and electronic prescriptions.  NOTE: The following applies primarily to controlled substances (Opioid* Pain Medications).   Patient's responsibilities: 1. Pain Pills: Bring all pain pills to every appointment (except for procedure appointments). 2. Pill Bottles: Bring pills in original pharmacy bottle. Always bring newest bottle. Bring bottle, even if empty. 3. Medication refills: You are responsible for knowing and keeping track of what medications you need refilled. The day before your appointment, write a list of all prescriptions that need to be refilled. Bring that list to your appointment and give it to the admitting nurse. Prescriptions will be written only during appointments. If you forget a medication, it will not be "Called in", "Faxed", or "electronically sent". You will need to get another appointment to get these prescribed. 4. Prescription Accuracy: You are responsible for carefully inspecting your prescriptions before leaving our office. Have the discharge nurse carefully go over each prescription with you, before taking them home. Make sure that your name is accurately spelled, that your address is correct. Check the name and dose of your medication to make sure it is accurate. Check the number of pills, and the written instructions to make sure they are clear and accurate. Make sure that you are given enough medication to last  until your next medication refill appointment. 5. Taking Medication: Take medication as prescribed. Never take more pills than instructed. Never take medication more frequently than prescribed. Taking less pills or less frequently is permitted and encouraged, when it comes to controlled substances (written prescriptions).  6. Inform other Doctors: Always inform, all of your healthcare providers, of all the medications you take. 7. Pain Medication from other Providers: You are not allowed to accept any additional pain medication from any other Doctor or Healthcare provider. There are two exceptions to this rule. (see below) In the event that you require additional pain medication, you are responsible for notifying us, as stated below. 8. Medication Agreement: You are responsible for carefully reading and following our Medication Agreement. This must be signed before receiving any prescriptions from our practice. Safely store a copy of your signed Agreement. Violations to the Agreement will result in no further prescriptions. (Additional copies of our Medication Agreement are available upon request.) 9. Laws, Rules, & Regulations: All patients are expected to follow all Federal and State Laws, Statutes, Rules, & Regulations. Ignorance of the Laws does not constitute a valid excuse. The use of any illegal substances is prohibited. 10. Adopted CDC guidelines & recommendations: Target dosing levels will be at or below 60 MME/day. Use of benzodiazepines** is not recommended.  Exceptions: There are only two exceptions to the rule of not receiving pain medications from other Healthcare Providers. 1. Exception #1 (Emergencies): In the event of an emergency (i.e.: accident requiring emergency care), you are allowed to receive additional pain medication. However, you are responsible for: As soon as you are able, call our office [phone], at any time of the day or night, and leave a message stating your name, the  date and nature of the emergency, and the name and dose of the medication   prescribed. In the event that your call is answered by a member of our staff, make sure to document and save the date, time, and the name of the person that took your information.  2. Exception #2 (Planned Surgery): In the event that you are scheduled by another doctor or dentist to have any type of surgery or procedure, you are allowed (for a period no longer than 30 days), to receive additional pain medication, for the acute post-op pain. However, in this case, you are responsible for picking up a copy of our "Post-op Pain Management for Surgeons" handout, and giving it to your surgeon or dentist. This document is available at our office, and does not require an appointment to obtain it. Simply go to our office during business hours (Monday-Thursday from 8:00 AM to 4:00 PM) (Friday 8:00 AM to 12:00 Noon) or if you have a scheduled appointment with us, prior to your surgery, and ask for it by name. In addition, you will need to provide us with your name, name of your surgeon, type of surgery, and date of procedure or surgery.  *Opioid medications include: morphine, codeine, oxycodone, oxymorphone, hydrocodone, hydromorphone, meperidine, tramadol, tapentadol, buprenorphine, fentanyl, methadone. **Benzodiazepine medications include: diazepam (Valium), alprazolam (Xanax), clonazepam (Klonopine), lorazepam (Ativan), clorazepate (Tranxene), chlordiazepoxide (Librium), estazolam (Prosom), oxazepam (Serax), temazepam (Restoril), triazolam (Halcion) (Last updated: 10/07/2017) ____________________________________________________________________________________________   ____________________________________________________________________________________________  Pain Scale  Introduction: The pain score used by this practice is the Verbal Numerical Rating Scale (VNRS-11). This is an 11-point scale. It is for adults and children 10 years or  older. There are significant differences in how the pain score is reported, used, and applied. Forget everything you learned in the past and learn this scoring system.  General Information: The scale should reflect your current level of pain. Unless you are specifically asked for the level of your worst pain, or your average pain. If you are asked for one of these two, then it should be understood that it is over the past 24 hours.  Basic Activities of Daily Living (ADL): Personal hygiene, dressing, eating, transferring, and using restroom.  Instructions: Most patients tend to report their level of pain as a combination of two factors, their physical pain and their psychosocial pain. This last one is also known as "suffering" and it is reflection of how physical pain affects you socially and psychologically. From now on, report them separately. From this point on, when asked to report your pain level, report only your physical pain. Use the following table for reference.  Pain Clinic Pain Levels (0-5/10)  Pain Level Score  Description  No Pain 0   Mild pain 1 Nagging, annoying, but does not interfere with basic activities of daily living (ADL). Patients are able to eat, bathe, get dressed, toileting (being able to get on and off the toilet and perform personal hygiene functions), transfer (move in and out of bed or a chair without assistance), and maintain continence (able to control bladder and bowel functions). Blood pressure and heart rate are unaffected. A normal heart rate for a healthy adult ranges from 60 to 100 bpm (beats per minute).   Mild to moderate pain 2 Noticeable and distracting. Impossible to hide from other people. More frequent flare-ups. Still possible to adapt and function close to normal. It can be very annoying and may have occasional stronger flare-ups. With discipline, patients may get used to it and adapt.   Moderate pain 3 Interferes significantly with activities of daily  living (ADL).   It becomes difficult to feed, bathe, get dressed, get on and off the toilet or to perform personal hygiene functions. Difficult to get in and out of bed or a chair without assistance. Very distracting. With effort, it can be ignored when deeply involved in activities.   Moderately severe pain 4 Impossible to ignore for more than a few minutes. With effort, patients may still be able to manage work or participate in some social activities. Very difficult to concentrate. Signs of autonomic nervous system discharge are evident: dilated pupils (mydriasis); mild sweating (diaphoresis); sleep interference. Heart rate becomes elevated (>115 bpm). Diastolic blood pressure (lower number) rises above 100 mmHg. Patients find relief in laying down and not moving.   Severe pain 5 Intense and extremely unpleasant. Associated with frowning face and frequent crying. Pain overwhelms the senses.  Ability to do any activity or maintain social relationships becomes significantly limited. Conversation becomes difficult. Pacing back and forth is common, as getting into a comfortable position is nearly impossible. Pain wakes you up from deep sleep. Physical signs will be obvious: pupillary dilation; increased sweating; goosebumps; brisk reflexes; cold, clammy hands and feet; nausea, vomiting or dry heaves; loss of appetite; significant sleep disturbance with inability to fall asleep or to remain asleep. When persistent, significant weight loss is observed due to the complete loss of appetite and sleep deprivation.  Blood pressure and heart rate becomes significantly elevated. Caution: If elevated blood pressure triggers a pounding headache associated with blurred vision, then the patient should immediately seek attention at an urgent or emergency care unit, as these may be signs of an impending stroke.    Emergency Department Pain Levels (6-10/10)  Emergency Room Pain 6 Severely limiting. Requires emergency care  and should not be seen or managed at an outpatient pain management facility. Communication becomes difficult and requires great effort. Assistance to reach the emergency department may be required. Facial flushing and profuse sweating along with potentially dangerous increases in heart rate and blood pressure will be evident.   Distressing pain 7 Self-care is very difficult. Assistance is required to transport, or use restroom. Assistance to reach the emergency department will be required. Tasks requiring coordination, such as bathing and getting dressed become very difficult.   Disabling pain 8 Self-care is no longer possible. At this level, pain is disabling. The individual is unable to do even the most "basic" activities such as walking, eating, bathing, dressing, transferring to a bed, or toileting. Fine motor skills are lost. It is difficult to think clearly.   Incapacitating pain 9 Pain becomes incapacitating. Thought processing is no longer possible. Difficult to remember your own name. Control of movement and coordination are lost.   The worst pain imaginable 10 At this level, most patients pass out from pain. When this level is reached, collapse of the autonomic nervous system occurs, leading to a sudden drop in blood pressure and heart rate. This in turn results in a temporary and dramatic drop in blood flow to the brain, leading to a loss of consciousness. Fainting is one of the body's self defense mechanisms. Passing out puts the brain in a calmed state and causes it to shut down for a while, in order to begin the healing process.    Summary: 1. Refer to this scale when providing us with your pain level. 2. Be accurate and careful when reporting your pain level. This will help with your care. 3. Over-reporting your pain level will lead to loss of credibility. 4. Even   a level of 1/10 means that there is pain and will be treated at our facility. 5. High, inaccurate reporting will be  documented as "Symptom Exaggeration", leading to loss of credibility and suspicions of possible secondary gains such as obtaining more narcotics, or wanting to appear disabled, for fraudulent reasons. 6. Only pain levels of 5 or below will be seen at our facility. 7. Pain levels of 6 and above will be sent to the Emergency Department and the appointment cancelled. ____________________________________________________________________________________________    

## 2017-10-21 ENCOUNTER — Telehealth: Payer: Self-pay | Admitting: *Deleted

## 2017-10-21 NOTE — Telephone Encounter (Signed)
Left message with husband for patient to call after she wakes up from nap if she is having any problems post TPI yesterday.

## 2017-11-01 ENCOUNTER — Telehealth: Payer: Self-pay | Admitting: *Deleted

## 2017-11-01 NOTE — Telephone Encounter (Signed)
Patient states that TPI she had done a few weeks ago is not helping. Lyrica is not helping.Taking TID 50mg . Continues to have right LBP and right buttock pain. Please advise on what to tell patient.

## 2017-11-01 NOTE — Telephone Encounter (Signed)
Per Lateef:  Patient may increase nitetime dose to 100mg  of Lyrica. Patient informed and states she would like to come in to see Crystal for an evaluation. Call transferred to make appointment.

## 2017-11-02 ENCOUNTER — Ambulatory Visit
Admission: RE | Admit: 2017-11-02 | Discharge: 2017-11-02 | Disposition: A | Discharge status: Home / Self Care | Payer: Medicare Other | Source: Ambulatory Visit | Attending: Nurse Practitioner | Admitting: Nurse Practitioner

## 2017-11-02 ENCOUNTER — Ambulatory Visit: Payer: Medicare Other | Attending: Nurse Practitioner | Admitting: Nurse Practitioner

## 2017-11-02 ENCOUNTER — Encounter: Payer: Self-pay | Admitting: Nurse Practitioner

## 2017-11-02 ENCOUNTER — Other Ambulatory Visit: Payer: Self-pay

## 2017-11-02 VITALS — BP 124/71 | HR 82 | Temp 97.9°F | Resp 16 | Ht 66.5 in | Wt 165.0 lb

## 2017-11-02 DIAGNOSIS — M48061 Spinal stenosis, lumbar region without neurogenic claudication: Secondary | ICD-10-CM | POA: Insufficient documentation

## 2017-11-02 DIAGNOSIS — E7800 Pure hypercholesterolemia, unspecified: Secondary | ICD-10-CM | POA: Diagnosis not present

## 2017-11-02 DIAGNOSIS — Z6826 Body mass index (BMI) 26.0-26.9, adult: Secondary | ICD-10-CM | POA: Insufficient documentation

## 2017-11-02 DIAGNOSIS — M79604 Pain in right leg: Secondary | ICD-10-CM

## 2017-11-02 DIAGNOSIS — C8290 Follicular lymphoma, unspecified, unspecified site: Secondary | ICD-10-CM | POA: Insufficient documentation

## 2017-11-02 DIAGNOSIS — Z79899 Other long term (current) drug therapy: Secondary | ICD-10-CM | POA: Insufficient documentation

## 2017-11-02 DIAGNOSIS — G8929 Other chronic pain: Secondary | ICD-10-CM | POA: Insufficient documentation

## 2017-11-02 DIAGNOSIS — Z86010 Personal history of colonic polyps: Secondary | ICD-10-CM | POA: Insufficient documentation

## 2017-11-02 DIAGNOSIS — M545 Low back pain: Secondary | ICD-10-CM | POA: Diagnosis not present

## 2017-11-02 DIAGNOSIS — Z859 Personal history of malignant neoplasm, unspecified: Secondary | ICD-10-CM | POA: Insufficient documentation

## 2017-11-02 DIAGNOSIS — M5135 Other intervertebral disc degeneration, thoracolumbar region: Secondary | ICD-10-CM | POA: Insufficient documentation

## 2017-11-02 DIAGNOSIS — Z87891 Personal history of nicotine dependence: Secondary | ICD-10-CM | POA: Insufficient documentation

## 2017-11-02 DIAGNOSIS — G20 Parkinson's disease: Secondary | ICD-10-CM | POA: Insufficient documentation

## 2017-11-02 DIAGNOSIS — Z981 Arthrodesis status: Secondary | ICD-10-CM | POA: Insufficient documentation

## 2017-11-02 DIAGNOSIS — M47897 Other spondylosis, lumbosacral region: Secondary | ICD-10-CM | POA: Diagnosis not present

## 2017-11-02 DIAGNOSIS — M5441 Lumbago with sciatica, right side: Secondary | ICD-10-CM | POA: Diagnosis not present

## 2017-11-02 DIAGNOSIS — H9190 Unspecified hearing loss, unspecified ear: Secondary | ICD-10-CM | POA: Insufficient documentation

## 2017-11-02 DIAGNOSIS — M5116 Intervertebral disc disorders with radiculopathy, lumbar region: Secondary | ICD-10-CM | POA: Diagnosis not present

## 2017-11-02 DIAGNOSIS — M5137 Other intervertebral disc degeneration, lumbosacral region: Secondary | ICD-10-CM | POA: Diagnosis not present

## 2017-11-02 DIAGNOSIS — M2578 Osteophyte, vertebrae: Secondary | ICD-10-CM | POA: Diagnosis not present

## 2017-11-02 DIAGNOSIS — E669 Obesity, unspecified: Secondary | ICD-10-CM | POA: Diagnosis not present

## 2017-11-02 DIAGNOSIS — F329 Major depressive disorder, single episode, unspecified: Secondary | ICD-10-CM | POA: Insufficient documentation

## 2017-11-02 DIAGNOSIS — G894 Chronic pain syndrome: Secondary | ICD-10-CM | POA: Diagnosis not present

## 2017-11-02 DIAGNOSIS — G479 Sleep disorder, unspecified: Secondary | ICD-10-CM | POA: Diagnosis not present

## 2017-11-02 DIAGNOSIS — F419 Anxiety disorder, unspecified: Secondary | ICD-10-CM | POA: Insufficient documentation

## 2017-11-02 DIAGNOSIS — M205X9 Other deformities of toe(s) (acquired), unspecified foot: Secondary | ICD-10-CM | POA: Diagnosis not present

## 2017-11-02 DIAGNOSIS — M47894 Other spondylosis, thoracic region: Secondary | ICD-10-CM | POA: Insufficient documentation

## 2017-11-02 DIAGNOSIS — M4186 Other forms of scoliosis, lumbar region: Secondary | ICD-10-CM | POA: Diagnosis not present

## 2017-11-02 DIAGNOSIS — M47896 Other spondylosis, lumbar region: Secondary | ICD-10-CM | POA: Diagnosis not present

## 2017-11-02 MED ORDER — OMEPRAZOLE 20 MG PO CPDR: 20.0000 mg | DELAYED_RELEASE_CAPSULE | Freq: Every day | ORAL | 3 refills | Status: DC

## 2017-11-02 MED ORDER — NAPROXEN 500 MG PO TBEC: 500.0000 mg | DELAYED_RELEASE_TABLET | Freq: Two times a day (BID) | ORAL | 0 refills | Status: DC

## 2017-11-02 MED ORDER — PREGABALIN 75 MG PO CAPS: 75.0000 mg | ORAL_CAPSULE | Freq: Three times a day (TID) | ORAL | 2 refills | Status: DC

## 2017-11-02 NOTE — Progress Notes (Signed)
Patient's Name: Sarah Donaldson  MRN: [id number]  Referring Provider: Cletis Athens, MD  DOB: June 17, 1951  PCP: Cletis Athens, MD  DOS: 11/02/2017  Note by: Vevelyn Francois NP  Service setting: Ambulatory outpatient  Specialty: Interventional Pain Management  Location: [facility] (AMB) Pain Management Facility    Patient type: Established    Primary Reason(s) for Visit: Evaluation of chronic illnesses with exacerbation, or progression (Level of risk: moderate) CC: Back Pain (lower) and Leg Pain (right)  HPI  Sarah Donaldson is a 67 y.o. year old, female patient, who comes today for a follow-up evaluation. She has Anxiety; Back ache; Low back pain with sciatica; Colon polyp; Clinical depression; Difficulty hearing; H/O adenomatous polyp of colon; Hypercholesteremia; Adiposity; Idiopathic Parkinson's disease ([place]); REM sleep behavior disorder; Detached retina; Disordered sleep; Has a tremor; Hallux limitus; Lymphoma ([place]); Follicular lymphoma of extranodal and solid organ sites [facility]); Benign neoplasm of hand; Hearing loss; Numbness and tingling; Sleep difficulties; Chronic pain syndrome; Chronic pain of right lower extremity; Chronic myofascial pain; Obesity, unspecified; Sleep disorder; Backache; Bilateral low back pain with sciatica; Low back pain; Lumbar radiculopathy; Depression; Retinal detachment; Hx of adenomatous colonic polyps; Tremor; Parkinson's disease ([place]); and Sleep behavior disorder, REM on their problem list. Sarah Donaldson was last seen on 10/20/2017. Her primarily concern today is the Back Pain (lower) and Leg Pain (right)  Pain Assessment: Location: Lower, Right, Left Back Radiating: moves around right hip to groin, down the front of right leg to ankle Onset: More than a month ago Duration: Chronic pain Quality: Aching, Burning, Constant Severity: 7 /10 (self-reported pain score)  Note: Reported level is compatible with observation.                          Effect on ADL: difficult to walk,  especially at night Timing: Constant Modifying factors: medications  Further details on both, my assessment(s), as well as the proposed treatment plan, please see below. Sarah Donaldson is in today for reevaluation. She was seen on 10/20/17 and was given an Trigger Point injection.  She admits that it was effective for a few day however now she is having more pain. She states that the pain is worse at night and in the morning. She admits that she notices more pain in her right groin and right leg. The pain goes to the outer portion of her her thigh down to her knee.  She admits that it is affecting her walking when she wakes at night to go to the bathroom.   She is SP lumbar spinal fusion approximately 2 years ago per her husband. She feels like the pain has gotten worse. She also admits that she was having this same lower back pain at the time of surgery. She has recently started on Lyrica because she had a reaction to the Gapabentin. She is currently on Lyrica 26m in the morning and Lyrica 1062mat night. She has not started the Lyrica 5050mn the afternoon. When asked she ambits that she did have a slip on fall on 10/28/17 while in her garage. She states that it was wet. She states that she is taking Tylenol arthritis and Aleve. She states that she is not taking the Meloxicam 7.5 BID because it was not effective.  Her last MRI 06/2017 indicates that she has degenerative changes, mild facet hypertrophy and mild foraminal stenosis. She has Parkinson and is treated for this. She is also taking Klonopin 2mg71me states  this is for "prevention of bad dreams."     Laboratory Chemistry  Inflammation Markers (CRP: Acute Phase) (ESR: Chronic Phase) Lab Results  Component Value Date   ESRSEDRATE 4 06/07/2013                         Rheumatology Markers No results found for: RF, ANA, LABURIC, URICUR, LYMEIGGIGMAB, LYMEABIGMQN                      Renal Function Markers Lab Results  Component Value Date    BUN 28 (H) 05/21/2017   CREATININE 0.69 05/21/2017   GFRAA >60 05/21/2017   GFRNONAA >60 05/21/2017                              Hepatic Function Markers Lab Results  Component Value Date   AST 20 05/21/2017   ALT <5 (L) 05/21/2017   ALBUMIN 3.8 05/21/2017   ALKPHOS 88 05/21/2017                        Electrolytes Lab Results  Component Value Date   NA 141 05/21/2017   K 4.1 05/21/2017   CL 107 05/21/2017   CALCIUM 9.3 05/21/2017                        Neuropathy Markers No results found for: VITAMINB12, FOLATE, HGBA1C, HIV                      Bone Pathology Markers No results found for: [place], [id number], [id number], [id number], 25OHVITD1, 25OHVITD2, 25OHVITD3, TESTOFREE, TESTOSTERONE                       Coagulation Parameters Lab Results  Component Value Date   PLT 189 05/21/2017                        Cardiovascular Markers Lab Results  Component Value Date   HGB 14.0 05/21/2017   HCT 42.2 05/21/2017                         CA Markers No results found for: CEA, CA125, LABCA2                      Note: Lab results reviewed.  Recent Diagnostic Imaging Review  Lumbosacral Imaging: Lumbar MR wo contrast:  Results for orders placed during the hospital encounter of 06/28/17  MR LUMBAR SPINE WO CONTRAST   Narrative CLINICAL DATA:  Lumbar radiculopathy. Right leg pain. Lumbar fusion 1 year ago  EXAM: MRI LUMBAR SPINE WITHOUT CONTRAST  TECHNIQUE: Multiplanar, multisequence MR imaging of the lumbar spine was performed. No intravenous contrast was administered.  COMPARISON:  Lumbar MRI 05/12/2016  FINDINGS: Segmentation:  Standard  Alignment:  Normal alignment  Vertebrae: Negative for fracture or mass. Asymmetric edema around the disc space on the right at L2-3 has developed since the prior study and appears be due to disc degeneration which is asymmetric on the right.  Conus medullaris and cauda equina: Conus extends to the L1-2 level. Conus  and cauda equina appear normal.  Paraspinal and other soft tissues: Negative for mass or fluid collection  Disc levels:  T12-L1:  Mild disc degeneration without stenosis  L1-2:  Interval pedicle screw and interbody fusion. Negative for stenosis. Removal of posterior osteophyte on the right. Neural foramina patent  L2-3: Asymmetric disc degeneration on the right with disc space narrowing and bone marrow edema. Right lateral spurring with mild right foraminal encroachment. Mild spinal stenosis.  L3-4: Disc degeneration with disc bulging and spurring. Mild left foraminal narrowing appears unchanged.  L4-5: Disc degeneration with disc bulging and spurring left greater than right appears unchanged. Mild facet hypertrophy. Mild subarticular stenosis on the left unchanged  L5-S1: Moderate disc degeneration with diffuse endplate spurring. No significant stenosis.  IMPRESSION: Interval pedicle screw and interbody fusion L1-2 without stenosis  Progressive disc degeneration on the right at L2-3 with disc space narrowing spurring and bone marrow edema. Mild spinal stenosis and mild right foraminal stenosis  Mild left foraminal narrowing L3-4 unchanged.  Mild subarticular stenosis on the left L4-5 due to spurring also unchanged.   Electronically Signed   By: Franchot Gallo M.D.   On: 06/28/2017 14:46    Lumbar MR w/wo contrast:  Results for orders placed during the hospital encounter of 04/18/14  MR Lumbar Spine W Wo Contrast   Narrative CLINICAL DATA:  Bilateral leg pain and low back pain for several months. History of non-Hodgkin's lymphoma. Parkinson's disease. No spinal surgery. LUMBAGO  EXAM: MRI LUMBAR SPINE WITHOUT AND WITH CONTRAST  TECHNIQUE: Multiplanar and multiecho pulse sequences of the lumbar spine were obtained without and with intravenous contrast.  CONTRAST:  1m MULTIHANCE GADOBENATE DIMEGLUMINE 529 MG/ML IV SOLN  BUN and creatinine were obtained on  site at [facility]  [address]  Results:  BUN 20 mg/dL,  Creatinine 0.8 mg/dL.  COMPARISON:  PET-CT 02/01/2014.  Radiographs 07/27/2013.  FINDINGS: Segmentation: The numbering convention used for this exam termed L5-S1 as the last intervertebral disc space. Five lumbar type vertebral bodies identified on prior radiographs.  Alignment: There is no spondylolisthesis. Levoconvex curve of the lumbar spine is present with the apex at L3. Curvature is mild.  Vertebrae: Vertebral body height preserved. Bone marrow signal shows heterogenous marrow. This is a nonspecific finding most commonly associated with obesity, anemia, cigarette smoking or chronic disease. Mild degenerative endplate changes at LK5-L9 mild degenerative endplate changes. No destructive osseous lesions.  Conus medullaris: Normal termination at L1.  Paraspinal tissues: Within normal limits. No visible retroperitoneal adenopathy.  Disc levels:  L1-L2: Disc desiccation. Shallow broad-based disc bulging. There is a low LEFT foraminal and extra foraminal zone disc protrusion without resulting stenosis. This could irritate the exited LEFT L1 nerve.  L2-L3: Disc desiccation and broad-based disc bulging. Minimal central stenosis. Foramina patent.  L3-L4: Mild disc desiccation and loss of height. Asymmetric right-sided ligamentum flavum redundancy. Central canal and lateral recesses are patent. The foramina also appear adequately patent.  L4-L5: RIGHT eccentric disc bulging. LEFT-greater-than-RIGHT facet arthrosis. Mild LEFT facet arthritis. Central canal and lateral recesses appear adequately patent. There is mild LEFT foraminal stenosis associated with asymmetric left-sided disc space collapse, endplate spurring and facet arthrosis.  L5-S1: Disc desiccation and degeneration. Central canal and lateral recesses patent. Broad-based posterior disc bulging and shallow endplate osteophytes. Mild  RIGHT foraminal stenosis.  After contrast administration, there is no pathologic enhancement in the lumbar spine.  IMPRESSION: Mild multilevel lumbar spondylosis, expected for age. Mild LEFT L4-L5 facet arthritis.   Electronically Signed   By: GDereck LigasM.D.   On: 04/18/2014 14:40    Lumbar CT wo contrast:  Results for orders placed during the hospital  encounter of 06/09/16  CT LUMBAR SPINE WO CONTRAST   Narrative CLINICAL DATA:  Evaluation for low back pain with extension into right anterior hip in into right lower extremity. History of lymphoma.  EXAM: CT LUMBAR SPINE WITHOUT CONTRAST  TECHNIQUE: Multidetector CT imaging of the lumbar spine was performed without intravenous contrast administration. Multiplanar CT image reconstructions were also generated.  COMPARISON:  None available.  FINDINGS: Segmentation: Normal segmentation. Lowest well-formed disc is labeled the L5-S1 level.  Alignment: Levoscoliosis with apex at L1-2. Vertebral bodies otherwise normally aligned with preservation of the normal lumbar lordosis. No listhesis.  Vertebrae: Vertebral body heights maintained. No evidence for acute or chronic fracture. Advanced reactive endplate changes present about the L1-2 interspace. Degenerative intervertebral disc space narrowing with disc desiccation throughout the entire lumbar spine, most prevalent at L1-2 as well. No worrisome osseous lesions. Visualized sacrum intact. SI joints symmetric and within normal limits bilaterally.  Paraspinal and other soft tissues: Paraspinous soft tissues are within normal limits. Scattered aorto bi-iliac atherosclerotic disease noted. Visualized visceral structures are unremarkable.  Disc levels:  T12-L1: Mild diffuse disc bulge with disc desiccation. No stenosis.  L1-2: Diffuse degenerative disc bulge with intervertebral disc space narrowing, disc desiccation, and prominent reactive endplate changes. Disc  bulging these eccentric to the right. No focal disc protrusion. Moderate bilateral facet arthrosis, slightly worse on the right. Mild right lateral recess stenosis related to endplate osteophytic spurring and bony overgrowth at the right L1-2 facet. No significant canal narrowing. Moderate to severe right foraminal stenosis (series 7, image 40).  L2-3: Diffuse degenerative disc bulge, eccentric to the left, with disc desiccation. Bilateral facet hypertrophy. No focal disc herniation. No significant canal stenosis. No significant foraminal encroachment.  L3-4: Diffuse degenerative disc bulge with disc desiccation and intervertebral disc space narrowing. Bulging disc flattens the ventral thecal sac. Superimposed mild facet hypertrophy. No significant canal stenosis. Mild left foraminal narrowing related to disc bulge and facet disease.  L4-5: Mild diffuse disc bulge with disc desiccation and intervertebral disc space narrowing. Superimposed mild bilateral facet arthrosis, slightly worse on the left. Resultant mild canal and left lateral recess stenosis. Mild to moderate bilateral foraminal narrowing related to disc bulge and facet disease, slightly worse on the left.  L5-S1: Diffuse degenerative disc bulge with disc desiccation and intervertebral disc space narrowing. Moderate bilateral facet arthrosis. No significant canal stenosis. Moderate bilateral foraminal narrowing related to disc osteophyte and bony overgrowth at the bilateral L5-S1 facets.  IMPRESSION: 1. Advanced degenerative disc disease at L1-2 with reactive endplate changes, with superimposed facet arthropathy. Resultant moderate to severe right foraminal stenosis, potentially affecting the right L1 nerve root. 2. Moderate bilateral foraminal stenosis at L5-S1 related to disc osteophyte and osseous overgrowth at the bilateral L5-S1 facets. 3. Additional more mild multilevel degenerative spondylolysis and facet  arthrosis at L2-3, L3-4, and L4-5 as above. 4. Levoscoliosis with apex at L1-2.   Electronically Signed   By: Jeannine Boga M.D.   On: 06/09/2016 16:34    Lumbar DG (Complete) 4+V:  Results for orders placed during the hospital encounter of 03/26/16  DG Lumbar Spine Complete   Narrative CLINICAL DATA:  Right-sided sciatic symptoms, low back pain radiating into the right hip and right leg for the past year.  EXAM: LUMBAR SPINE - COMPLETE 4+ VIEW  COMPARISON:  Lumbar spine MRI dated April 18, 2014 and lumbar spine series of July 27, 2013.  FINDINGS: The bones are subjectively osteopenic. There is mild levocurvature centered at L1-2  that is more conspicuous today. The pedicles and transverse processes are intact. The vertebral bodies are preserved in height. There is moderate degenerative disc space narrowing at T12-L1 and L1-L2. There is facet joint hypertrophy at L4-5 and L5-S1. The observed portions of the sacrum are normal.  IMPRESSION: There is no compression fracture nor other acute bony abnormality. There is degenerative disc space narrowing at T12-L1 and L1-L2. There is facet joint hypertrophy at L4-5 and L5-S1. Mild levocurvature is present centered at L1-2.   Electronically Signed   By: David  Martinique M.D.   On: 03/27/2016 07:54   Foot Imaging: Foot-R DG Complete:  Results for orders placed in visit on 02/19/17  DG Foot Complete Right   Narrative Please see detailed radiograph report in office note.   Foot-L DG Complete:  Results for orders placed in visit on 08/16/14  DG Foot Complete Left   Narrative 3 views of a skeletally mature individual were obtained of the left foot.  Study includes AP, oblique, lateral projections.  Decrease in calcaneal inclination angle with an increase in talar  declination angle consistent with flatfoot deformity. There is no evidence  of acute fracture or stress fracture at this time.    Complexity Note:  Imaging results reviewed. Results shared with Ms. Meske, using Layman's terms.                         Meds   Current Outpatient Medications:  .  Acetaminophen (TYLENOL ARTHRITIS PAIN PO), Take by mouth 2 (two) times daily., Disp: , Rfl:  .  carbidopa-levodopa (SINEMET IR) 25-100 MG per tablet, TAKE 2 tablets in a.m. and 1 tab BY MOUTH every 4 hrs, Disp: , Rfl: 0 .  clonazePAM (KLONOPIN) 1 MG tablet, Take 2 mg by mouth daily. , Disp: , Rfl:  .  furosemide (LASIX) 40 MG tablet, Take 20 mg by mouth as needed for fluid. Twice a week, Disp: , Rfl:  .  neomycin-polymyxin b-dexamethasone (MAXITROL) 3.5-10000-0.1 OINT, Place 1 application into the left eye at bedtime., Disp: , Rfl: 2 .  omeprazole (PRILOSEC) 20 MG capsule, Take 1 capsule (20 mg total) by mouth daily., Disp: 1 capsule, Rfl: 3 .  pramipexole (MIRAPEX) 1.5 MG tablet, Take 1.5 mg by mouth 3 (three) times daily., Disp: , Rfl: 5 .  prednisoLONE acetate (PRED FORTE) 1 % ophthalmic suspension, Place 1 drop into the left eye daily., Disp: , Rfl: 5 .  pregabalin (LYRICA) 50 MG capsule, 50 mg 1 tab twice daily for one week then increase the three times daily, Disp: 90 capsule, Rfl: 0 .  promethazine (PHENERGAN) 12.5 MG tablet, Take 12.5 mg by mouth every 6 (six) hours as needed for nausea or vomiting., Disp: , Rfl:  .  ranitidine (ZANTAC) 150 MG capsule, Take 150 mg by mouth every evening., Disp: , Rfl:  .  selegiline (ELDEPRYL) 5 MG capsule, TAKE 1 CAPSULE BY MOUTH WITH BREAKFAST AND LUNCH., Disp: , Rfl: 1 .  traZODone (DESYREL) 50 MG tablet, TAKE 1-2 TABLETS NIGHTLY AS NEEDED FOR SLEEP., Disp: , Rfl: 1 .  naproxen (EC NAPROSYN) 500 MG EC tablet, Take 1 tablet (500 mg total) by mouth 2 (two) times daily with a meal., Disp: 60 tablet, Rfl: 0 .  pregabalin (LYRICA) 75 MG capsule, Take 1 capsule (75 mg total) by mouth 3 (three) times daily., Disp: 90 capsule, Rfl: 2  ROS  Constitutional: Denies any fever or chills Gastrointestinal: No reported  hemesis,  hematochezia, vomiting, or acute GI distress Musculoskeletal: Denies any acute onset joint swelling, redness, loss of ROM, or weakness Neurological: No reported episodes of acute onset apraxia, aphasia, dysarthria, agnosia, amnesia, paralysis, loss of coordination, or loss of consciousness  Allergies  Ms. Bonito is allergic to codeine sulfate.  [place]  Drug: Ms. Baroni  reports that she does not use drugs. Alcohol:  reports that she drinks alcohol. Tobacco:  reports that she has quit smoking. She has never used smokeless tobacco. Medical:  has a past medical history of Cancer ([place]), Parkinson disease ([place]), and Parkinson disease ([place]). Surgical: Ms. Morrish  has a past surgical history that includes Retinal detachment surgery; Bunionectomy; Nasal septum surgery; and Back surgery. Family: family history includes Heart disease in her mother.  Constitutional Exam  General appearance: alert and cooperative Vitals:   11/02/17 1123  BP: 124/71  Pulse: 82  Resp: 16  Temp: 97.9 F (36.6 C)  TempSrc: Oral  SpO2: 99%  Weight: 165 lb (74.8 kg)  Height: 5' 6.5" (1.689 m)   BMI Assessment: Estimated body mass index is 26.23 kg/m as calculated from the following:   Height as of this encounter: 5' 6.5" (1.689 m).   Weight as of this encounter: 165 lb (74.8 kg). Psych/Mental status: Alert, oriented x 3 (person, place, & time)       Eyes: PERLA Respiratory: No evidence of acute respiratory distress  Thoracic Spine Area Exam  Skin & Axial Inspection: No masses, redness, or swelling Alignment: Asymmetric Functional ROM: Unrestricted ROM Stability: No instability detected Muscle Tone/Strength: Functionally intact. No obvious neuro-muscular anomalies detected. Sensory (Neurological): Unimpaired Muscle strength & Tone: No palpable anomalies  Lumbar Spine Area Exam  Skin & Axial Inspection: Well healed scar from previous spine surgery detected Alignment: Asymmetric Functional ROM:  Limited ROM      Muscle Tone/Strength: Functionally intact. No obvious neuro-muscular anomalies detected. Sensory (Neurological): Unimpaired Palpation: Complains of area being tender to palpation       Provocative Tests: Lumbar Hyperextension and rotation test: evaluation deferred today       Lumbar Lateral bending test: evaluation deferred today       Patrick's Maneuver: evaluation deferred today                    Gait & Posture Assessment  Ambulation: Unassisted Gait: Relatively normal for age and body habitus Posture: WNL   Lower Extremity Exam    Side: Right lower extremity  Side: Left lower extremity  Skin & Extremity Inspection: Skin color, temperature, and hair growth are WNL. No peripheral edema or cyanosis. No masses, redness, swelling, asymmetry, or associated skin lesions. No contractures.  Skin & Extremity Inspection: Skin color, temperature, and hair growth are WNL. No peripheral edema or cyanosis. No masses, redness, swelling, asymmetry, or associated skin lesions. No contractures.  Functional ROM: Unrestricted ROM          Functional ROM: Unrestricted ROM          Muscle Tone/Strength: Functionally intact. No obvious neuro-muscular anomalies detected.  Muscle Tone/Strength: Functionally intact. No obvious neuro-muscular anomalies detected.  Sensory (Neurological): Unimpaired  Sensory (Neurological): Unimpaired  Palpation: No palpable anomalies  Palpation: No palpable anomalies   Assessment  Primary Diagnosis & Pertinent Problem List: The primary encounter diagnosis was Chronic bilateral low back pain with right-sided sciatica. Diagnoses of Chronic bilateral low back pain, with sciatica presence unspecified, Chronic pain of right lower extremity, and Chronic pain syndrome were also pertinent to this visit.  Status Diagnosis  Persistent Persistent Persistent 1. Chronic bilateral low back pain with right-sided sciatica   2. Chronic bilateral low back pain, with sciatica  presence unspecified   3. Chronic pain of right lower extremity   4. Chronic pain syndrome     Problems updated and reviewed during this visit: Problem  Low Back Pain  Sleep Behavior Disorder, Rem  Lumbar Radiculopathy  Bilateral Low Back Pain With Sciatica  Hx of Adenomatous Colonic Polyps  Obesity, Unspecified  Sleep Disorder  Backache  Tremor  Depression  Retinal Detachment  Parkinson's Disease ([facility])   Plan of Care  Pharmacotherapy (Medications Ordered): Meds ordered this encounter  Medications  . naproxen (EC NAPROSYN) 500 MG EC tablet    Sig: Take 1 tablet (500 mg total) by mouth 2 (two) times daily with a meal.    Dispense:  60 tablet    Refill:  0    Order Specific Question:   Supervising Provider    AnswerMilinda Pointer [phone]  . pregabalin (LYRICA) 75 MG capsule    Sig: Take 1 capsule (75 mg total) by mouth 3 (three) times daily.    Dispense:  90 capsule    Refill:  2    Order Specific Question:   Supervising Provider    Answer:   Milinda Pointer [phone]  . omeprazole (PRILOSEC) 20 MG capsule    Sig: Take 1 capsule (20 mg total) by mouth daily.    Dispense:  1 capsule    Refill:  3    Order Specific Question:   Supervising Provider    Answer:   Milinda Pointer [phone]   New Prescriptions   NAPROXEN (EC NAPROSYN) 500 MG EC TABLET    Take 1 tablet (500 mg total) by mouth 2 (two) times daily with a meal.   PREGABALIN (LYRICA) 75 MG CAPSULE    Take 1 capsule (75 mg total) by mouth 3 (three) times daily.   Medications administered today: [name] had no medications administered during this visit. Lab-work, procedure(s), and/or referral(s): Orders Placed This Encounter  Procedures  . DG Lumbar Spine Complete W/Bend   Imaging and/or referral(s): None  Interventional therapies: Planned, scheduled, and/or pending:   Not at this time.   Provider-requested follow-up: Return for Appointment As Scheduled.  Future Appointments  Date  Time Provider [place]  11/17/2017  1:45 PM Vevelyn Francois, NP [facility] None  11/19/2017 11:00 AM [facility] None  11/19/2017 11:30 AM Cammie Sickle, MD [facility] None   Primary Care Physician: Cletis Athens, MD Location: [facility] Outpatient Pain Management Facility Note by: Vevelyn Francois NP Date: 11/02/2017; Time: 1:49 PM  Pain Score Disclaimer: We use the NRS-11 scale. This is a self-reported, subjective measurement of pain severity with only modest accuracy. It is used primarily to identify changes within a particular patient. It must be understood that outpatient pain scales are significantly less accurate that those used for research, where they can be applied under ideal controlled circumstances with minimal exposure to variables. In reality, the score is likely to be a combination of pain intensity and pain affect, where pain affect describes the degree of emotional arousal or changes in action readiness caused by the sensory experience of pain. Factors such as social and work situation, setting, emotional state, anxiety levels, expectation, and prior pain experience may influence pain perception and show large inter-individual differences that may also be affected by time variables.  Patient instructions provided during this appointment:  Patient Instructions  Prescriptions for Omeprazole and Naprosyn were sent to your pharmacy. You were given one prescription for Lyrica. Please get your x-rays done as soon as possible.

## 2017-11-02 NOTE — Progress Notes (Signed)
Safety precautions to be maintained throughout the outpatient stay will include: orient to surroundings, keep bed in low position, maintain call bell within reach at all times, provide assistance with transfer out of bed and ambulation.  

## 2017-11-02 NOTE — Patient Instructions (Addendum)
Prescriptions for Omeprazole and Naprosyn were sent to your pharmacy. You were given one prescription for Lyrica. Please get your x-rays done as soon as possible.

## 2017-11-03 NOTE — Progress Notes (Signed)
Results were reviewed and found to be: mildly abnormal  No acute injury or pathology identified  Review would suggest interventional pain management techniques may be of benefit 

## 2017-11-08 DIAGNOSIS — M792 Neuralgia and neuritis, unspecified: Secondary | ICD-10-CM | POA: Diagnosis not present

## 2017-11-08 DIAGNOSIS — K219 Gastro-esophageal reflux disease without esophagitis: Secondary | ICD-10-CM | POA: Diagnosis not present

## 2017-11-08 DIAGNOSIS — C8190 Hodgkin lymphoma, unspecified, unspecified site: Secondary | ICD-10-CM | POA: Diagnosis not present

## 2017-11-08 DIAGNOSIS — M25562 Pain in left knee: Secondary | ICD-10-CM | POA: Diagnosis not present

## 2017-11-09 DIAGNOSIS — R202 Paresthesia of skin: Secondary | ICD-10-CM | POA: Diagnosis not present

## 2017-11-09 DIAGNOSIS — E669 Obesity, unspecified: Secondary | ICD-10-CM | POA: Diagnosis not present

## 2017-11-09 DIAGNOSIS — R200 Anesthesia of skin: Secondary | ICD-10-CM | POA: Diagnosis not present

## 2017-11-09 DIAGNOSIS — G20 Parkinson's disease: Secondary | ICD-10-CM | POA: Diagnosis not present

## 2017-11-09 DIAGNOSIS — G4752 REM sleep behavior disorder: Secondary | ICD-10-CM | POA: Diagnosis not present

## 2017-11-16 ENCOUNTER — Encounter: Payer: Medicare Other | Admitting: Nurse Practitioner

## 2017-11-17 ENCOUNTER — Other Ambulatory Visit: Payer: Self-pay

## 2017-11-17 ENCOUNTER — Ambulatory Visit: Payer: Medicare Other | Attending: Nurse Practitioner | Admitting: Nurse Practitioner

## 2017-11-17 ENCOUNTER — Encounter: Payer: Self-pay | Admitting: Nurse Practitioner

## 2017-11-17 VITALS — BP 130/79 | HR 78 | Temp 97.9°F | Resp 18 | Ht 66.5 in | Wt 165.0 lb

## 2017-11-17 DIAGNOSIS — M5440 Lumbago with sciatica, unspecified side: Secondary | ICD-10-CM | POA: Diagnosis not present

## 2017-11-17 DIAGNOSIS — M5441 Lumbago with sciatica, right side: Secondary | ICD-10-CM

## 2017-11-17 DIAGNOSIS — G8929 Other chronic pain: Secondary | ICD-10-CM

## 2017-11-17 DIAGNOSIS — M79604 Pain in right leg: Secondary | ICD-10-CM | POA: Insufficient documentation

## 2017-11-17 DIAGNOSIS — Z79899 Other long term (current) drug therapy: Secondary | ICD-10-CM | POA: Diagnosis not present

## 2017-11-17 DIAGNOSIS — G894 Chronic pain syndrome: Secondary | ICD-10-CM | POA: Diagnosis not present

## 2017-11-17 DIAGNOSIS — M7918 Myalgia, other site: Secondary | ICD-10-CM | POA: Insufficient documentation

## 2017-11-17 DIAGNOSIS — Z888 Allergy status to other drugs, medicaments and biological substances status: Secondary | ICD-10-CM | POA: Diagnosis not present

## 2017-11-17 DIAGNOSIS — Z5181 Encounter for therapeutic drug level monitoring: Secondary | ICD-10-CM | POA: Diagnosis not present

## 2017-11-17 DIAGNOSIS — Z87891 Personal history of nicotine dependence: Secondary | ICD-10-CM | POA: Insufficient documentation

## 2017-11-17 DIAGNOSIS — M5416 Radiculopathy, lumbar region: Secondary | ICD-10-CM | POA: Diagnosis not present

## 2017-11-17 DIAGNOSIS — F419 Anxiety disorder, unspecified: Secondary | ICD-10-CM | POA: Diagnosis not present

## 2017-11-17 DIAGNOSIS — F329 Major depressive disorder, single episode, unspecified: Secondary | ICD-10-CM | POA: Diagnosis not present

## 2017-11-17 DIAGNOSIS — G20 Parkinson's disease: Secondary | ICD-10-CM | POA: Diagnosis not present

## 2017-11-17 DIAGNOSIS — M79605 Pain in left leg: Secondary | ICD-10-CM | POA: Diagnosis not present

## 2017-11-17 NOTE — Progress Notes (Signed)
Patient's Name: Sarah Donaldson  MRN: [id number]  Referring Provider: Cletis Athens, MD  DOB: 07/29/1951  PCP: Cletis Athens, MD  DOS: 11/17/2017  Note by: Vevelyn Francois NP  Service setting: Ambulatory outpatient  Specialty: Interventional Pain Management  Location: [facility] (AMB) Pain Management Facility    Patient type: Established    Primary Reason(s) for Visit: Encounter for prescription drug management. (Level of risk: moderate)  CC: Back Pain (right lower ) and Leg Pain (bilateral )  HPI  [name] is a 67 y.o. year old, adult patient, who comes today for a medication management evaluation. She has Anxiety; Back ache; Low back pain with sciatica; Colon polyp; Clinical depression; Difficulty hearing; H/O adenomatous polyp of colon; Hypercholesteremia; Adiposity; Idiopathic Parkinson's disease ([place]); REM sleep behavior disorder; Detached retina; Disordered sleep; Has a tremor; Hallux limitus; Lymphoma ([place]); Follicular lymphoma of extranodal and solid organ sites [facility]); Benign neoplasm of hand; Hearing loss; Numbness and tingling; Sleep difficulties; Chronic pain syndrome; Chronic pain of right lower extremity; Chronic myofascial pain; Obesity, unspecified; Sleep disorder; Backache; Bilateral low back pain with sciatica; Low back pain; Lumbar radiculopathy; Depression; Retinal detachment; Hx of adenomatous colonic polyps; Tremor; Parkinson's disease ([place]); and Sleep behavior disorder, REM on their problem list. Her primarily concern today is the Back Pain (right lower ) and Leg Pain (bilateral )  Pain Assessment: Location: Right, Lower Back Radiating: Radiates down to roght hip to groin, down the front of left leg. Pain also in front of left leg.  Onset: More than a month ago Duration: Chronic pain Quality: Aching, Burning, Constant Severity: 8 /10 (self-reported pain score)  Note: Reported level is compatible with observation.                          Effect on ADL: Difficult to walk  Timing:  Constant Modifying factors: Medications; ice; heat   [name] was last scheduled for an appointment on 11/02/2017 for medication management. During today's appointment we reviewed [name]'s chronic pain status, as well as her outpatient medication regimen. She feels like the naproxen 500 mg twice daily is effective. She admits that she's only been taking the Lyrica 75 mg 3 times daily for approximately one week. She denies any adverse side effects. She admits that she is in increased pain today secondary to working in the yard with her grandchildren.  The patient  reports that she does not use drugs. Her body mass index is 26.23 kg/m.  Further details on both, my assessment(s), as well as the proposed treatment plan, please see below.  Controlled Substance Pharmacotherapy Assessment REMS (Risk Evaluation and Mitigation Strategy)  Analgesic: none MME/day:38m/day.  No notes on file Pharmacokinetics: Liberation and absorption (onset of action): WNL Distribution (time to peak effect): WNL Metabolism and excretion (duration of action): WNL         Pharmacodynamics: Desired effects: Analgesia: Ms. MCobarrubiasreports >50% benefit. Functional ability: Patient reports that medication allows her to accomplish basic ADLs Clinically meaningful improvement in function (CMIF): Sustained CMIF goals met Perceived effectiveness: Described as relatively effective, allowing for increase in activities of daily living (ADL) Undesirable effects: Side-effects or Adverse reactions: None reported Monitoring: [place] PMP: Online review of the past 145-montheriod conducted. Compliant with practice rules and regulations Last UDS on record: No results found for: SUMMARY UDS interpretation: Compliant          Medication Assessment Form: Reviewed. Patient indicates being compliant with therapy Treatment compliance:  Compliant Risk Assessment Profile: Aberrant behavior: See prior evaluations. None observed or detected  today Comorbid factors increasing risk of overdose: See prior notes. No additional risks detected today Risk of substance use disorder (SUD): Low Opioid Risk Tool - 11/02/17 1132      Family History of Substance Abuse   Alcohol  Positive Female    Illegal Drugs  Negative    Rx Drugs  Negative      Personal History of Substance Abuse   Alcohol  Negative    Illegal Drugs  Negative    Rx Drugs  Negative      Age   Age between 20-45 years   No      Psychological Disease   Psychological Disease  Negative    Depression  Negative      Total Score   Opioid Risk Tool Scoring  1    Opioid Risk Interpretation  Low Risk      ORT Scoring interpretation table:  Score <3 = Low Risk for SUD  Score between 4-7 = Moderate Risk for SUD  Score >8 = High Risk for Opioid Abuse   Risk Mitigation Strategies:  Patient Counseling: Covered Patient-Prescriber Agreement (PPA): Present and active  Notification to other healthcare providers: Done  Pharmacologic Plan: No change in therapy, at this time.             Laboratory Chemistry  Inflammation Markers (CRP: Acute Phase) (ESR: Chronic Phase) Lab Results  Component Value Date   ESRSEDRATE 4 06/07/2013                         Rheumatology Markers No results found for: RF, ANA, LABURIC, URICUR, LYMEIGGIGMAB, LYMEABIGMQN                      Renal Function Markers Lab Results  Component Value Date   BUN 28 (H) 05/21/2017   CREATININE 0.69 05/21/2017   GFRAA >60 05/21/2017   GFRNONAA >60 05/21/2017                              Hepatic Function Markers Lab Results  Component Value Date   AST 20 05/21/2017   ALT <5 (L) 05/21/2017   ALBUMIN 3.8 05/21/2017   ALKPHOS 88 05/21/2017                        Electrolytes Lab Results  Component Value Date   NA 141 05/21/2017   K 4.1 05/21/2017   CL 107 05/21/2017   CALCIUM 9.3 05/21/2017                        Neuropathy Markers No results found for: VITAMINB12, FOLATE, HGBA1C, HIV                       Bone Pathology Markers No results found for: VD25OH, VD125OH2TOT, [id number], [id number], 25OHVITD1, 25OHVITD2, 25OHVITD3, TESTOFREE, TESTOSTERONE                       Coagulation Parameters Lab Results  Component Value Date   PLT 189 05/21/2017                        Cardiovascular Markers Lab Results  Component Value Date   HGB 14.0 05/21/2017  HCT 42.2 05/21/2017                         CA Markers No results found for: CEA, CA125, LABCA2                      Note: Lab results reviewed.  Recent Diagnostic Imaging Results  DG Lumbar Spine Complete W/Bend CLINICAL DATA:  67 year old female with chronic low back pain and difficulty walking. Initial encounter.  EXAM: LUMBAR SPINE - COMPLETE WITH BENDING VIEWS  COMPARISON:  06/28/2017 lumbar spine MR.  FINDINGS: Scoliosis lower thoracic and lumbar spine convex left.  Prior fusion L1-2 with bilateral pedicle screws, posterior connecting bar and interbody spacer.  Mild-to-moderate T12-L1 disc space narrowing.  Marked L2-3 disc space narrowing with adjacent endplate reactive changes.  Mild L3-4 and L4-5 disc space narrowing.  Moderate L5-S1 disc space narrowing.  No abnormal motion with flexion or extension.  IMPRESSION: Scoliosis convex left.  Prior fusion L1-2.  Degenerative changes throughout the lower thoracic and lumbar spine most notable L2-3 and L5-S1 level.  No abnormal motion between flexion and extension.  Electronically Signed   By: Genia Del M.D.   On: 11/02/2017 18:35  Complexity Note: Imaging results reviewed. Results shared with Ms. Rasnick, using Layman's terms.                         Meds   Current Outpatient Medications:  .  Acetaminophen (TYLENOL ARTHRITIS PAIN PO), Take by mouth 2 (two) times daily., Disp: , Rfl:  .  carbidopa-levodopa (SINEMET IR) 25-100 MG per tablet, TAKE 2 tablets in a.m. and 1 tab BY MOUTH every 4 hrs, Disp: , Rfl: 0 .  clonazePAM  (KLONOPIN) 1 MG tablet, Take 2 mg by mouth daily. , Disp: , Rfl:  .  furosemide (LASIX) 40 MG tablet, Take 20 mg by mouth as needed for fluid. Twice a week, Disp: , Rfl:  .  naproxen (EC NAPROSYN) 500 MG EC tablet, Take 1 tablet (500 mg total) by mouth 2 (two) times daily with a meal., Disp: 60 tablet, Rfl: 0 .  neomycin-polymyxin b-dexamethasone (MAXITROL) 3.5-10000-0.1 OINT, Place 1 application into the left eye at bedtime., Disp: , Rfl: 2 .  omeprazole (PRILOSEC) 20 MG capsule, Take 1 capsule (20 mg total) by mouth daily., Disp: 1 capsule, Rfl: 3 .  pramipexole (MIRAPEX) 1.5 MG tablet, Take 1.5 mg by mouth 3 (three) times daily., Disp: , Rfl: 5 .  prednisoLONE acetate (PRED FORTE) 1 % ophthalmic suspension, Place 1 drop into the left eye daily., Disp: , Rfl: 5 .  pregabalin (LYRICA) 75 MG capsule, Take 1 capsule (75 mg total) by mouth 3 (three) times daily., Disp: 90 capsule, Rfl: 2 .  promethazine (PHENERGAN) 12.5 MG tablet, Take 12.5 mg by mouth every 6 (six) hours as needed for nausea or vomiting., Disp: , Rfl:  .  ranitidine (ZANTAC) 150 MG capsule, Take 150 mg by mouth every evening., Disp: , Rfl:  .  selegiline (ELDEPRYL) 5 MG capsule, TAKE 1 CAPSULE BY MOUTH WITH BREAKFAST AND LUNCH., Disp: , Rfl: 1 .  traZODone (DESYREL) 50 MG tablet, TAKE 1-2 TABLETS NIGHTLY AS NEEDED FOR SLEEP., Disp: , Rfl: 1  ROS  Constitutional: Denies any fever or chills Gastrointestinal: No reported hemesis, hematochezia, vomiting, or acute GI distress Musculoskeletal: Denies any acute onset joint swelling, redness, loss of ROM, or weakness Neurological: No reported episodes  of acute onset apraxia, aphasia, dysarthria, agnosia, amnesia, paralysis, loss of coordination, or loss of consciousness  Allergies  Ms. Pascarella is allergic to codeine sulfate and gabapentin.  [place]  Drug: Ms. Briski  reports that she does not use drugs. Alcohol:  reports that she drinks alcohol. Tobacco:  reports that she has quit  smoking. She has never used smokeless tobacco. Medical:  has a past medical history of Cancer ([place]), Parkinson disease ([place]), and Parkinson disease ([place]). Surgical: Ms. Hollibaugh  has a past surgical history that includes Retinal detachment surgery; Bunionectomy; Nasal septum surgery; and Back surgery. Family: family history includes Heart disease in her mother.  Constitutional Exam  General appearance: Well nourished, well developed, and well hydrated. In no apparent acute distress Vitals:   11/17/17 1402  BP: 130/79  Pulse: 78  Resp: 18  Temp: 97.9 F (36.6 C)  TempSrc: Oral  SpO2: 96%  Weight: 165 lb (74.8 kg)  Height: 5' 6.5" (1.689 m)  Psych/Mental status: Alert, oriented x 3 (person, place, & time)       Eyes: PERLA Respiratory: No evidence of acute respiratory distress  Thoracic Spine Area Exam  Skin & Axial Inspection: No masses, redness, or swelling Alignment: Symmetrical Functional ROM: Unrestricted ROM Stability: No instability detected Muscle Tone/Strength: Functionally intact. No obvious neuro-muscular anomalies detected. Sensory (Neurological): Unimpaired Muscle strength & Tone: No palpable anomalies  Lumbar Spine Area Exam  Skin & Axial Inspection: No masses, redness, or swelling Alignment: Symmetrical Functional ROM: Unrestricted ROM      Stability: No instability detected Muscle Tone/Strength: Functionally intact. No obvious neuro-muscular anomalies detected. Sensory (Neurological): Unimpaired Palpation: Tender       Provocative Tests: Lumbar Hyperextension and rotation test: evaluation deferred today       Lumbar Lateral bending test: evaluation deferred today       Patrick's Maneuver: evaluation deferred today                    Gait & Posture Assessment  Ambulation: Unassisted Gait: Awkward Posture:    Lower Extremity Exam    Side: Right lower extremity  Side: Left lower extremity  Skin & Extremity Inspection: Skin color, temperature, and hair  growth are WNL. No peripheral edema or cyanosis. No masses, redness, swelling, asymmetry, or associated skin lesions. No contractures.  Skin & Extremity Inspection: Skin color, temperature, and hair growth are WNL. No peripheral edema or cyanosis. No masses, redness, swelling, asymmetry, or associated skin lesions. No contractures.  Functional ROM: Unrestricted ROM          Functional ROM: Unrestricted ROM          Muscle Tone/Strength: Functionally intact. No obvious neuro-muscular anomalies detected.  Muscle Tone/Strength: Functionally intact. No obvious neuro-muscular anomalies detected.  Sensory (Neurological): Unimpaired  Sensory (Neurological): Unimpaired  Palpation: No palpable anomalies  Palpation: No palpable anomalies   Assessment  Primary Diagnosis & Pertinent Problem List: The primary encounter diagnosis was Chronic bilateral low back pain with right-sided sciatica. Diagnoses of Chronic pain of right lower extremity, Chronic myofascial pain, and Chronic pain syndrome were also pertinent to this visit.  Status Diagnosis  Persistent Controlled Persistent 1. Chronic bilateral low back pain with right-sided sciatica   2. Chronic pain of right lower extremity   3. Chronic myofascial pain   4. Chronic pain syndrome     Problems updated and reviewed during this visit: No problems updated. Plan of Care  Pharmacotherapy (Medications Ordered): No orders of the defined types were  placed in this encounter.  New Prescriptions   No medications on file   Medications administered today: [name] had no medications administered during this visit. Lab-work, procedure(s), and/or referral(s): No orders of the defined types were placed in this encounter.  Imaging and/or referral(s): None  Interventional therapies: Planned, scheduled, and/or pending:   Not at this time. Will follow up in 3 weeks for evaluation of Lyrica efficacy and potential side effect evaluation     Provider-requested follow-up: Return in about 3 weeks (around 12/08/2017) for MedMgmt with Me Donella Stade Edison Pace).  Future Appointments  Date Time Provider [place]  11/19/2017 11:00 AM [facility] None  11/19/2017 11:30 AM Cammie Sickle, MD [facility] None  12/08/2017 11:30 AM Vevelyn Francois, NP [facility] None   Primary Care Physician: Cletis Athens, MD Location: [facility] Note by: Vevelyn Francois NP Date: 11/17/2017; Time: 5:57 PM  Pain Score Disclaimer: We use the NRS-11 scale. This is a self-reported, subjective measurement of pain severity with only modest accuracy. It is used primarily to identify changes within a particular patient. It must be understood that outpatient pain scales are significantly less accurate that those used for research, where they can be applied under ideal controlled circumstances with minimal exposure to variables. In reality, the score is likely to be a combination of pain intensity and pain affect, where pain affect describes the degree of emotional arousal or changes in action readiness caused by the sensory experience of pain. Factors such as social and work situation, setting, emotional state, anxiety levels, expectation, and prior pain experience may influence pain perception and show large inter-individual differences that may also be affected by time variables.  Patient instructions provided during this appointment: Patient Instructions  ____________________________________________________________________________________________  Medication Rules  Applies to: All patients receiving prescriptions (written or electronic).  Pharmacy of record: Pharmacy where electronic prescriptions will be sent. If written prescriptions are taken to a different pharmacy, please inform the nursing staff. The pharmacy listed in the electronic medical record should be the one where you would like electronic  prescriptions to be sent.  Prescription refills: Only during scheduled appointments. Applies to both, written and electronic prescriptions.  NOTE: The following applies primarily to controlled substances (Opioid* Pain Medications).   Patient's responsibilities: 1. Pain Pills: Bring all pain pills to every appointment (except for procedure appointments). 2. Pill Bottles: Bring pills in original pharmacy bottle. Always bring newest bottle. Bring bottle, even if empty. 3. Medication refills: You are responsible for knowing and keeping track of what medications you need refilled. The day before your appointment, write a list of all prescriptions that need to be refilled. Bring that list to your appointment and give it to the admitting nurse. Prescriptions will be written only during appointments. If you forget a medication, it will not be "Called in", "Faxed", or "electronically sent". You will need to get another appointment to get these prescribed. 4. Prescription Accuracy: You are responsible for carefully inspecting your prescriptions before leaving our office. Have the discharge nurse carefully go over each prescription with you, before taking them home. Make sure that your name is accurately spelled, that your address is correct. Check the name and dose of your medication to make sure it is accurate. Check the number of pills, and the written instructions to make sure they are clear and accurate. Make sure that you are given enough medication to last until your next medication refill appointment. 5. Taking Medication: Take medication  as prescribed. Never take more pills than instructed. Never take medication more frequently than prescribed. Taking less pills or less frequently is permitted and encouraged, when it comes to controlled substances (written prescriptions).  6. Inform other Doctors: Always inform, all of your healthcare providers, of all the medications you take. 7. Pain Medication from  other Providers: You are not allowed to accept any additional pain medication from any other Doctor or Healthcare provider. There are two exceptions to this rule. (see below) In the event that you require additional pain medication, you are responsible for notifying us, as stated below. 8. Medication Agreement: You are responsible for carefully reading and following our Medication Agreement. This must be signed before receiving any prescriptions from our practice. Safely store a copy of your signed Agreement. Violations to the Agreement will result in no further prescriptions. (Additional copies of our Medication Agreement are available upon request.) 9. Laws, Rules, & Regulations: All patients are expected to follow all Federal and [employer], [employer], Rules, [employer]. Ignorance of the Laws does not constitute a valid excuse. The use of any illegal substances is prohibited. 10. Adopted CDC guidelines & recommendations: Target dosing levels will be at or below 60 MME/day. Use of benzodiazepines** is not recommended.  Exceptions: There are only two exceptions to the rule of not receiving pain medications from other Healthcare Providers. 1. Exception #1 (Emergencies): In the event of an emergency (i.e.: accident requiring emergency care), you are allowed to receive additional pain medication. However, you are responsible for: As soon as you are able, call our office [phone], at any time of the day or night, and leave a message stating your name, the date and nature of the emergency, and the name and dose of the medication prescribed. In the event that your call is answered by a member of our staff, make sure to document and save the date, time, and the name of the person that took your information.  2. Exception #2 (Planned Surgery): In the event that you are scheduled by another doctor or dentist to have any type of surgery or procedure, you are allowed (for a period no longer than 30 days), to  receive additional pain medication, for the acute post-op pain. However, in this case, you are responsible for picking up a copy of our "Post-op Pain Management for Surgeons" handout, and giving it to your surgeon or dentist. This document is available at our office, and does not require an appointment to obtain it. Simply go to our office during business hours (Monday-Thursday from 8:00 AM to 4:00 PM) (Friday 8:00 AM to 12:00 Noon) or if you have a scheduled appointment with [place], prior to your surgery, and ask for it by name. In addition, you will need to provide [place] with your name, name of your surgeon, type of surgery, and date of procedure or surgery.  *Opioid medications include: morphine, codeine, oxycodone, oxymorphone, hydrocodone, hydromorphone, meperidine, tramadol, tapentadol, buprenorphine, fentanyl, methadone. **Benzodiazepine medications include: diazepam (Valium), alprazolam (Xanax), clonazepam (Klonopine), lorazepam (Ativan), clorazepate (Tranxene), chlordiazepoxide (Librium), estazolam (Prosom), oxazepam (Serax), temazepam (Restoril), triazolam (Halcion) (Last updated: 10/07/2017) ____________________________________________________________________________________________

## 2017-11-17 NOTE — Patient Instructions (Signed)
____________________________________________________________________________________________  Medication Rules  Applies to: All patients receiving prescriptions (written or electronic).  Pharmacy of record: Pharmacy where electronic prescriptions will be sent. If written prescriptions are taken to a different pharmacy, please inform the nursing staff. The pharmacy listed in the electronic medical record should be the one where you would like electronic prescriptions to be sent.  Prescription refills: Only during scheduled appointments. Applies to both, written and electronic prescriptions.  NOTE: The following applies primarily to controlled substances (Opioid* Pain Medications).   Patient's responsibilities: 1. Pain Pills: Bring all pain pills to every appointment (except for procedure appointments). 2. Pill Bottles: Bring pills in original pharmacy bottle. Always bring newest bottle. Bring bottle, even if empty. 3. Medication refills: You are responsible for knowing and keeping track of what medications you need refilled. The day before your appointment, write a list of all prescriptions that need to be refilled. Bring that list to your appointment and give it to the admitting nurse. Prescriptions will be written only during appointments. If you forget a medication, it will not be "Called in", "Faxed", or "electronically sent". You will need to get another appointment to get these prescribed. 4. Prescription Accuracy: You are responsible for carefully inspecting your prescriptions before leaving our office. Have the discharge nurse carefully go over each prescription with you, before taking them home. Make sure that your name is accurately spelled, that your address is correct. Check the name and dose of your medication to make sure it is accurate. Check the number of pills, and the written instructions to make sure they are clear and accurate. Make sure that you are given enough medication to last  until your next medication refill appointment. 5. Taking Medication: Take medication as prescribed. Never take more pills than instructed. Never take medication more frequently than prescribed. Taking less pills or less frequently is permitted and encouraged, when it comes to controlled substances (written prescriptions).  6. Inform other Doctors: Always inform, all of your healthcare providers, of all the medications you take. 7. Pain Medication from other Providers: You are not allowed to accept any additional pain medication from any other Doctor or Healthcare provider. There are two exceptions to this rule. (see below) In the event that you require additional pain medication, you are responsible for notifying us, as stated below. 8. Medication Agreement: You are responsible for carefully reading and following our Medication Agreement. This must be signed before receiving any prescriptions from our practice. Safely store a copy of your signed Agreement. Violations to the Agreement will result in no further prescriptions. (Additional copies of our Medication Agreement are available upon request.) 9. Laws, Rules, & Regulations: All patients are expected to follow all Federal and State Laws, Statutes, Rules, & Regulations. Ignorance of the Laws does not constitute a valid excuse. The use of any illegal substances is prohibited. 10. Adopted CDC guidelines & recommendations: Target dosing levels will be at or below 60 MME/day. Use of benzodiazepines** is not recommended.  Exceptions: There are only two exceptions to the rule of not receiving pain medications from other Healthcare Providers. 1. Exception #1 (Emergencies): In the event of an emergency (i.e.: accident requiring emergency care), you are allowed to receive additional pain medication. However, you are responsible for: As soon as you are able, call our office [phone], at any time of the day or night, and leave a message stating your name, the  date and nature of the emergency, and the name and dose of the medication   prescribed. In the event that your call is answered by a member of our staff, make sure to document and save the date, time, and the name of the person that took your information.  2. Exception #2 (Planned Surgery): In the event that you are scheduled by another doctor or dentist to have any type of surgery or procedure, you are allowed (for a period no longer than 30 days), to receive additional pain medication, for the acute post-op pain. However, in this case, you are responsible for picking up a copy of our "Post-op Pain Management for Surgeons" handout, and giving it to your surgeon or dentist. This document is available at our office, and does not require an appointment to obtain it. Simply go to our office during business hours (Monday-Thursday from 8:00 AM to 4:00 PM) (Friday 8:00 AM to 12:00 Noon) or if you have a scheduled appointment with us, prior to your surgery, and ask for it by name. In addition, you will need to provide us with your name, name of your surgeon, type of surgery, and date of procedure or surgery.  *Opioid medications include: morphine, codeine, oxycodone, oxymorphone, hydrocodone, hydromorphone, meperidine, tramadol, tapentadol, buprenorphine, fentanyl, methadone. **Benzodiazepine medications include: diazepam (Valium), alprazolam (Xanax), clonazepam (Klonopine), lorazepam (Ativan), clorazepate (Tranxene), chlordiazepoxide (Librium), estazolam (Prosom), oxazepam (Serax), temazepam (Restoril), triazolam (Halcion) (Last updated: 10/07/2017) ____________________________________________________________________________________________    

## 2017-11-19 ENCOUNTER — Inpatient Hospital Stay: Payer: Medicare Other

## 2017-11-19 ENCOUNTER — Inpatient Hospital Stay: Payer: Medicare Other | Attending: Internal Medicine | Admitting: Internal Medicine

## 2017-11-19 ENCOUNTER — Other Ambulatory Visit: Payer: Self-pay

## 2017-11-19 VITALS — BP 119/72 | HR 96 | Temp 97.6°F | Resp 16 | Wt 165.0 lb

## 2017-11-19 DIAGNOSIS — Z9225 Personal history of immunosupression therapy: Secondary | ICD-10-CM | POA: Diagnosis not present

## 2017-11-19 DIAGNOSIS — E041 Nontoxic single thyroid nodule: Secondary | ICD-10-CM | POA: Diagnosis not present

## 2017-11-19 DIAGNOSIS — Z87891 Personal history of nicotine dependence: Secondary | ICD-10-CM | POA: Insufficient documentation

## 2017-11-19 DIAGNOSIS — R5383 Other fatigue: Secondary | ICD-10-CM | POA: Insufficient documentation

## 2017-11-19 DIAGNOSIS — G20 Parkinson's disease: Secondary | ICD-10-CM | POA: Insufficient documentation

## 2017-11-19 DIAGNOSIS — Z8572 Personal history of non-Hodgkin lymphomas: Secondary | ICD-10-CM | POA: Diagnosis not present

## 2017-11-19 DIAGNOSIS — G8929 Other chronic pain: Secondary | ICD-10-CM

## 2017-11-19 DIAGNOSIS — Z79899 Other long term (current) drug therapy: Secondary | ICD-10-CM | POA: Diagnosis not present

## 2017-11-19 DIAGNOSIS — M549 Dorsalgia, unspecified: Secondary | ICD-10-CM | POA: Insufficient documentation

## 2017-11-19 DIAGNOSIS — G620 Drug-induced polyneuropathy: Secondary | ICD-10-CM | POA: Diagnosis not present

## 2017-11-19 DIAGNOSIS — C8299 Follicular lymphoma, unspecified, extranodal and solid organ sites: Secondary | ICD-10-CM

## 2017-11-19 LAB — COMPREHENSIVE METABOLIC PANEL
ALT: 8 U/L — ABNORMAL LOW (ref 14–54)
AST: 18 U/L (ref 15–41)
Albumin: 3.9 g/dL (ref 3.5–5.0)
Alkaline Phosphatase: 105 U/L (ref 38–126)
Anion gap: 7 (ref 5–15)
BUN: 31 mg/dL — ABNORMAL HIGH (ref 6–20)
CO2: 25 mmol/L (ref 22–32)
Calcium: 9.2 mg/dL (ref 8.9–10.3)
Chloride: 108 mmol/L (ref 101–111)
Creatinine, Ser: 0.61 mg/dL (ref 0.44–1.00)
GFR calc Af Amer: >60 mL/min (ref >60)
GFR calc non Af Amer: >60 mL/min (ref >60)
Glucose, Bld: 96 mg/dL (ref 65–99)
Potassium: 4 mmol/L (ref 3.5–5.1)
Sodium: 140 mmol/L (ref 135–145)
Total Bilirubin: 0.7 mg/dL (ref 0.3–1.2)
Total Protein: 6.8 g/dL (ref 6.5–8.1)

## 2017-11-19 LAB — CBC WITH DIFFERENTIAL/PLATELET
Basophils Absolute: 0 10*3/uL (ref 0–0.1)
Basophils Relative: 1 %
Eosinophils Absolute: 0.1 10*3/uL (ref 0–0.7)
Eosinophils Relative: 3 %
HCT: 42.7 % (ref 35.0–47.0)
Hemoglobin: 14.5 g/dL (ref 12.0–16.0)
Lymphocytes Relative: 30 %
Lymphs Abs: 1.7 10*3/uL (ref 1.0–3.6)
MCH: 29.6 pg (ref 26.0–34.0)
MCHC: 33.9 g/dL (ref 32.0–36.0)
MCV: 87.3 fL (ref 80.0–100.0)
Monocytes Absolute: 0.6 10*3/uL (ref 0.2–0.9)
Monocytes Relative: 11 %
Neutro Abs: 3.3 10*3/uL (ref 1.4–6.5)
Neutrophils Relative %: 57 %
Platelets: 185 10*3/uL (ref 150–440)
RBC: 4.89 MIL/uL (ref 3.80–5.20)
RDW: 13.3 % (ref 11.5–14.5)
WBC: 5.9 10*3/uL (ref 3.6–11.0)

## 2017-11-19 LAB — LACTATE DEHYDROGENASE: LDH: 151 U/L (ref 98–192)

## 2017-11-19 NOTE — Assessment & Plan Note (Addendum)
#   Low-grade follicular lymphoma; involving the spleen. Rituxan maintenance last December 2015.  #  Currently on surveillance. PET scan OCT 2018- NED.  Clinically I am not suspicious of any recurrence [see discussion below regarding back pain].   # Left thyroid nodule status post previous biopsy- followed by Dr.McQueen.   # s/p back surgery/ Chronic pain right hip low back/sciatica.I do not suspect related to her lymphoma especially as the PET scan is negative/MRI Nov 2018- NED.   #  We will follow up in 6 months/labs/  M.D. Visit/ PET prior [discussed re: insurance]

## 2017-11-19 NOTE — Progress Notes (Signed)
[facility] OFFICE PROGRESS NOTE  Patient Care Team: Cletis Athens, MD as PCP - General (Cardiology)  Cancer Staging No matching staging information was found for the patient.   Oncology History   1. Superficial Protidectomy (right side) December 17/2013.  Consistent with low-grade CD10 and [id number] to follicular lymphoma grade 1-2.  Is positive for CD19 and cough of light chain.bone marrow aspiration and biopsy is negative .  PET scan was positive for some increased uptake in the spleen; Stage IVA disease PET scan is consistent with bilateral neck lymph node (December, 2013) 2. Weekly rituxan has been started.  August 24, 2012 3. Patient finished the last dose of Rituxan on September 14 2012  #  maintenance rituximab since April of 2014 patient has finished rituximabin December of 2015  # PET MARCH 2017 -Splenic recurrence; CT images stable. 4th October 2017- PET - NED.   # chronic back pain/PN/ Parkinsons [Dr.Potter]     Follicular lymphoma of extranodal and solid organ sites [facility])      INTERVAL HISTORY:  Sarah Donaldson 67 y.o.  adult pleasant patient above history of Low-grade non-Hodgkin's lymphoma is here for follow-up.  Unfortunately patient continues to have chronic back pain radiating to the legs.  She had recent x-rays of the lower back that were negative for any acute process; showed arthritic changes.  Denies any unusual weight loss.  Denies any night sweats. Denies any fevers. Denies any lumps or bumps.  She does feel tired/have a stiff back after physical exertion working the garden.   REVIEW OF SYSTEMS:  A complete 10 point review of system is done which is negative except mentioned above/history of present illness.   PAST MEDICAL HISTORY :  Past Medical History:  Diagnosis Date  . Cancer ([place])    Non-hodgkin's lymphoma (in remission)  . Parkinson disease ([place])   . Parkinson disease ([place])     PAST SURGICAL HISTORY :   Past Surgical  History:  Procedure Laterality Date  . BACK SURGERY    . BUNIONECTOMY    . NASAL SEPTUM SURGERY    . RETINAL DETACHMENT SURGERY      FAMILY HISTORY :   Family History  Problem Relation Age of Onset  . Heart disease Mother     SOCIAL HISTORY:   Social History   Tobacco Use  . Smoking status: Former Research scientist (life sciences)  . Smokeless tobacco: Never Used  Substance Use Topics  . Alcohol use: Yes    Alcohol/week: 0.0 oz  . Drug use: No    ALLERGIES:  is allergic to codeine sulfate and gabapentin.  MEDICATIONS:  Current Outpatient Medications  Medication Sig Dispense Refill  . Acetaminophen (TYLENOL ARTHRITIS PAIN PO) Take by mouth 2 (two) times daily.    . carbidopa-levodopa (SINEMET IR) 25-100 MG per tablet TAKE 2 tablets in a.m. and 1 tab BY MOUTH every 4 hrs  0  . clonazePAM (KLONOPIN) 1 MG tablet Take 2 mg by mouth daily.     . furosemide (LASIX) 40 MG tablet Take 20 mg by mouth as needed for fluid. Twice a week    . neomycin-polymyxin b-dexamethasone (MAXITROL) 3.5-10000-0.1 OINT Place 1 application into the left eye at bedtime.  2  . omeprazole (PRILOSEC) 20 MG capsule Take 1 capsule (20 mg total) by mouth daily. 1 capsule 3  . pramipexole (MIRAPEX) 1.5 MG tablet Take 1.5 mg by mouth 3 (three) times daily.  5  . prednisoLONE acetate (PRED FORTE) 1 % ophthalmic suspension  Place 1 drop into the left eye daily.  5  . pregabalin (LYRICA) 75 MG capsule Take 1 capsule (75 mg total) by mouth 3 (three) times daily. 90 capsule 2  . promethazine (PHENERGAN) 12.5 MG tablet Take 12.5 mg by mouth every 6 (six) hours as needed for nausea or vomiting.    . ranitidine (ZANTAC) 150 MG capsule Take 150 mg by mouth every evening.    . selegiline (ELDEPRYL) 5 MG capsule TAKE 1 CAPSULE BY MOUTH WITH BREAKFAST AND LUNCH.  1  . traZODone (DESYREL) 50 MG tablet TAKE 1-2 TABLETS NIGHTLY AS NEEDED FOR SLEEP.  1  . naproxen (EC NAPROSYN) 500 MG EC tablet Take 1 tablet (500 mg total) by mouth 2 (two) times daily  with a meal. 60 tablet 0   No current facility-administered medications for this visit.     PHYSICAL EXAMINATION: ECOG PERFORMANCE STATUS: 0 - Asymptomatic  BP 119/72 (BP Location: Left Arm, Patient Position: Sitting)   Pulse 96   Temp 97.6 F (36.4 C) (Tympanic)   Resp 16   Wt 165 lb (74.8 kg)   BMI 26.23 kg/m   Filed Weights   11/19/17 1147  Weight: 165 lb (74.8 kg)    GENERAL: Well-nourished well-developed; Alert, no distress and comfortable.   With her husband.  She appears uncomfortable because of back pain [chronic.] EYES: no pallor or icterus OROPHARYNX: no thrush or ulceration; good dentition  NECK: supple, no masses felt LYMPH:  no palpable lymphadenopathy in the cervical, axillary or inguinal regions LUNGS: clear to auscultation and  No wheeze or crackles HEART/CVS: regular rate & rhythm and no murmurs; No lower extremity edema ABDOMEN:abdomen soft, non-tender and normal bowel sounds Musculoskeletal:no cyanosis of digits and no clubbing  PSYCH: alert & oriented x 3 with fluent speech NEURO: no focal motor/sensory deficits SKIN:  no rashes or significant lesions  LABORATORY DATA:  I have reviewed the data as listed    Component Value Date/Time   NA 140 11/19/2017 1124   NA 139 12/03/2014 1038   K 4.0 11/19/2017 1124   K 3.6 12/03/2014 1038   CL 108 11/19/2017 1124   CL 106 12/03/2014 1038   CO2 25 11/19/2017 1124   CO2 28 12/03/2014 1038   GLUCOSE 96 11/19/2017 1124   GLUCOSE 94 12/03/2014 1038   BUN 31 (H) 11/19/2017 1124   BUN 21 (H) 12/03/2014 1038   CREATININE 0.61 11/19/2017 1124   CREATININE 0.65 12/03/2014 1038   CALCIUM 9.2 11/19/2017 1124   CALCIUM 9.1 12/03/2014 1038   PROT 6.8 11/19/2017 1124   PROT 6.9 12/03/2014 1038   ALBUMIN 3.9 11/19/2017 1124   ALBUMIN 4.2 12/03/2014 1038   AST 18 11/19/2017 1124   AST 19 12/03/2014 1038   ALT 8 (L) 11/19/2017 1124   ALT < 5 (L) 12/03/2014 1038   ALKPHOS 105 11/19/2017 1124   ALKPHOS 89  12/03/2014 1038   BILITOT 0.7 11/19/2017 1124   BILITOT 0.8 12/03/2014 1038   GFRNONAA >60 11/19/2017 1124   GFRNONAA >60 12/03/2014 1038   GFRAA >60 11/19/2017 1124   GFRAA >60 12/03/2014 1038    No results found for: SPEP, UPEP  Lab Results  Component Value Date   WBC 5.9 11/19/2017   NEUTROABS 3.3 11/19/2017   HGB 14.5 11/19/2017   HCT 42.7 11/19/2017   MCV 87.3 11/19/2017   PLT 185 11/19/2017      Chemistry      Component Value Date/Time   NA 140  11/19/2017 1124   NA 139 12/03/2014 1038   K 4.0 11/19/2017 1124   K 3.6 12/03/2014 1038   CL 108 11/19/2017 1124   CL 106 12/03/2014 1038   CO2 25 11/19/2017 1124   CO2 28 12/03/2014 1038   BUN 31 (H) 11/19/2017 1124   BUN 21 (H) 12/03/2014 1038   CREATININE 0.61 11/19/2017 1124   CREATININE 0.65 12/03/2014 1038      Component Value Date/Time   CALCIUM 9.2 11/19/2017 1124   CALCIUM 9.1 12/03/2014 1038   ALKPHOS 105 11/19/2017 1124   ALKPHOS 89 12/03/2014 1038   AST 18 11/19/2017 1124   AST 19 12/03/2014 1038   ALT 8 (L) 11/19/2017 1124   ALT < 5 (L) 12/03/2014 1038   BILITOT 0.7 11/19/2017 1124   BILITOT 0.8 12/03/2014 1038     IMPRESSION: 1. No evidence for residual or recurrent hypermetabolic tumor. 2. Similar appearance of hypermetabolic left thyroid nodule.   Electronically Signed   By: Kerby Moors M.D.   On: 05/19/2017 12:03   RADIOGRAPHIC STUDIES: I have personally reviewed the radiological images as listed and agreed with the findings in the report. No results found.   ASSESSMENT & PLAN:  Follicular lymphoma of extranodal and solid organ sites [facility]) # Low-grade follicular lymphoma; involving the spleen. Rituxan maintenance last December 2015.  #  Currently on surveillance. PET scan OCT 2018- NED.  Clinically I am not suspicious of any recurrence [see discussion below regarding back pain].   # Left thyroid nodule status post previous biopsy- followed by Dr.McQueen.   # s/p back surgery/  Chronic pain right hip low back/sciatica.I do not suspect related to her lymphoma especially as the PET scan is negative/MRI Nov 2018- NED.   #  We will follow up in 6 months/labs/  M.D. Visit/ PET prior [discussed re: insurance]   # 25 minutes face-to-face with the patient discussing the above plan of care; more than 50% of time spent on prognosis/ natural history; counseling and coordination.  Orders Placed This Encounter  Procedures  . NM PET Image Restag (PS) Skull Base To Thigh    Standing Status:   Future    Standing Expiration Date:   11/19/2018    Order Specific Question:   If indicated for the ordered procedure, I authorize the administration of a radiopharmaceutical per Radiology protocol    Answer:   Yes    Order Specific Question:   Preferred imaging location?    Answer:   [facility]    Order Specific Question:   Radiology Contrast Protocol - do NOT remove file path    Answer:   [email or web address]  . CBC with Differential    Standing Status:   Future    Standing Expiration Date:   11/20/2018  . Comprehensive metabolic panel    Standing Status:   Future    Standing Expiration Date:   11/20/2018  . Lactate dehydrogenase    Standing Status:   Future    Standing Expiration Date:   11/20/2018   All questions were answered. The patient knows to call the clinic with any problems, questions or concerns.      Cammie Sickle, MD 11/19/2017 12:14 PM

## 2017-11-29 ENCOUNTER — Telehealth: Payer: Self-pay | Admitting: Student in an Organized Health Care Education/Training Program

## 2017-11-29 DIAGNOSIS — M5416 Radiculopathy, lumbar region: Secondary | ICD-10-CM

## 2017-11-29 NOTE — Telephone Encounter (Signed)
Order placed for repeat prn ESI (R>L leg pain)

## 2017-11-29 NOTE — Telephone Encounter (Signed)
Please schedule

## 2017-11-29 NOTE — Telephone Encounter (Signed)
Tried to call patient, got vmail box that is full and cannot leave msg

## 2017-11-29 NOTE — Telephone Encounter (Signed)
Pain is in right lower back, going down outside of leg to just above the knee.

## 2017-11-29 NOTE — Telephone Encounter (Signed)
Patient lvmail this morning asking if she could come in for an injection, having a lot of pain and can hardly walk.

## 2017-12-01 ENCOUNTER — Other Ambulatory Visit: Payer: Self-pay

## 2017-12-01 ENCOUNTER — Encounter: Payer: Self-pay | Admitting: Student in an Organized Health Care Education/Training Program

## 2017-12-01 ENCOUNTER — Ambulatory Visit
Admission: RE | Admit: 2017-12-01 | Discharge: 2017-12-01 | Disposition: A | Discharge status: Home / Self Care | Payer: Medicare Other | Source: Ambulatory Visit | Attending: Student in an Organized Health Care Education/Training Program | Admitting: Student in an Organized Health Care Education/Training Program

## 2017-12-01 ENCOUNTER — Ambulatory Visit (HOSPITAL_BASED_OUTPATIENT_CLINIC_OR_DEPARTMENT_OTHER): Payer: Medicare Other | Admitting: Student in an Organized Health Care Education/Training Program

## 2017-12-01 VITALS — BP 125/51 | HR 83 | Temp 97.2°F | Resp 23 | Ht 66.5 in | Wt 165.0 lb

## 2017-12-01 DIAGNOSIS — Z888 Allergy status to other drugs, medicaments and biological substances status: Secondary | ICD-10-CM | POA: Insufficient documentation

## 2017-12-01 DIAGNOSIS — Z79899 Other long term (current) drug therapy: Secondary | ICD-10-CM | POA: Diagnosis not present

## 2017-12-01 DIAGNOSIS — F419 Anxiety disorder, unspecified: Secondary | ICD-10-CM | POA: Insufficient documentation

## 2017-12-01 DIAGNOSIS — M545 Low back pain: Secondary | ICD-10-CM | POA: Diagnosis present

## 2017-12-01 DIAGNOSIS — M5416 Radiculopathy, lumbar region: Secondary | ICD-10-CM | POA: Diagnosis not present

## 2017-12-01 DIAGNOSIS — Z885 Allergy status to narcotic agent status: Secondary | ICD-10-CM | POA: Insufficient documentation

## 2017-12-01 MED ORDER — ROPIVACAINE HCL 2 MG/ML IJ SOLN
INTRAMUSCULAR | Status: AC
  Filled 2017-12-01: qty 10

## 2017-12-01 MED ORDER — IOPAMIDOL (ISOVUE-M 200) INJECTION 41%
10.0000 mL | Freq: Once | INTRAMUSCULAR | Status: AC
  Administered 2017-12-01: 10 mL via EPIDURAL
  Filled 2017-12-01: qty 10

## 2017-12-01 MED ORDER — ROPIVACAINE HCL 2 MG/ML IJ SOLN
2.0000 mL | Freq: Once | INTRAMUSCULAR | Status: AC
  Administered 2017-12-01: 2 mL via EPIDURAL

## 2017-12-01 MED ORDER — SODIUM CHLORIDE 0.9 % IJ SOLN
INTRAMUSCULAR | Status: AC
  Filled 2017-12-01: qty 10

## 2017-12-01 MED ORDER — FENTANYL CITRATE (PF) 100 MCG/2ML IJ SOLN
INTRAMUSCULAR | Status: AC
  Filled 2017-12-01: qty 2

## 2017-12-01 MED ORDER — FENTANYL CITRATE (PF) 100 MCG/2ML IJ SOLN
25.0000 ug | INTRAMUSCULAR | Status: DC | PRN
  Administered 2017-12-01: 25 ug via INTRAVENOUS

## 2017-12-01 MED ORDER — DEXAMETHASONE SODIUM PHOSPHATE 10 MG/ML IJ SOLN
10.0000 mg | Freq: Once | INTRAMUSCULAR | Status: AC
  Administered 2017-12-01: 10 mg

## 2017-12-01 MED ORDER — LIDOCAINE HCL (PF) 1 % IJ SOLN
4.5000 mL | Freq: Once | INTRAMUSCULAR | Status: AC
  Administered 2017-12-01: 5 mL

## 2017-12-01 MED ORDER — SODIUM CHLORIDE 0.9% FLUSH
2.0000 mL | Freq: Once | INTRAVENOUS | Status: AC
  Administered 2017-12-01: 2 mL

## 2017-12-01 MED ORDER — LIDOCAINE HCL (PF) 1 % IJ SOLN
INTRAMUSCULAR | Status: AC
  Filled 2017-12-01: qty 5

## 2017-12-01 MED ORDER — DEXAMETHASONE SODIUM PHOSPHATE 10 MG/ML IJ SOLN
INTRAMUSCULAR | Status: AC
  Filled 2017-12-01: qty 1

## 2017-12-01 NOTE — Progress Notes (Signed)
Safety precautions to be maintained throughout the outpatient stay will include: orient to surroundings, keep bed in low position, maintain call bell within reach at all times, provide assistance with transfer out of bed and ambulation.  

## 2017-12-01 NOTE — Patient Instructions (Signed)

## 2017-12-01 NOTE — Progress Notes (Signed)
Patient's Name: Sarah Donaldson  MRN: [id number]  Referring Provider: Cletis Athens, MD  DOB: 11/15/1950  PCP: Cletis Athens, MD  DOS: 12/01/2017  Note by: Gillis Santa, MD  Service setting: Ambulatory outpatient  Specialty: Interventional Pain Management  Patient type: Established  Location: [facility] (AMB) Pain Management Facility  Visit type: Interventional Procedure   Primary Reason for Visit: Interventional Pain Management Treatment. CC: Back Pain (low and right) and Leg Pain (right)  Procedure:       Anesthesia, Analgesia, Anxiolysis:  Type: Therapeutic Inter-Laminar Epidural Steroid Injection #2  Region: Lumbar Level: L3-4 Level. Laterality: Right-Sided         Type: Moderate (Conscious) Sedation combined with Local Anesthesia Indication(s): Analgesia and Anxiety Route: Intravenous (IV) IV Access: Secured Sedation: Meaningful verbal contact was maintained at all times during the procedure  Local Anesthetic: Lidocaine 1-2%   Indications: 1. Lumbar radiculopathy    Pain Score: Pre-procedure: 10-Worst pain ever/10 Post-procedure: 0-No pain/10  Pre-op Assessment:  [name] is a 67 y.o. (year old), adult patient, seen today for interventional treatment. She  has a past surgical history that includes Retinal detachment surgery; Bunionectomy; Nasal septum surgery; and Back surgery. [name] has a current medication list which includes the following prescription(s): acetaminophen, carbidopa-levodopa, clonazepam, furosemide, naproxen, neomycin-polymyxin b-dexamethasone, omeprazole, pramipexole, prednisolone acetate, pregabalin, promethazine, ranitidine, selegiline, and trazodone, and the following Facility-Administered Medications: fentanyl. Her primarily concern today is the Back Pain (low and right) and Leg Pain (right)  Initial Vital Signs:  Pulse/HCG Rate: 86ECG Heart Rate: 78 Temp: 97.7 F (36.5 C) Resp: 18 BP: 121/79 SpO2: 95 %  BMI: Estimated body mass index is 26.23 kg/m as  calculated from the following:   Height as of this encounter: 5' 6.5" (1.689 m).   Weight as of this encounter: 165 lb (74.8 kg).  Risk Assessment: Allergies: Reviewed. She is allergic to codeine sulfate and gabapentin.  Allergy Precautions: None required Coagulopathies: Reviewed. None identified.  Blood-thinner therapy: None at this time Active Infection(s): Reviewed. None identified. [name] is afebrile  Site Confirmation: [name] was asked to confirm the procedure and laterality before marking the site Procedure checklist: Completed Consent: Before the procedure and under the influence of no sedative(s), amnesic(s), or anxiolytics, the patient was informed of the treatment options, risks and possible complications. To fulfill our ethical and legal obligations, as recommended by the American Medical Association's Code of Ethics, I have informed the patient of my clinical impression; the nature and purpose of the treatment or procedure; the risks, benefits, and possible complications of the intervention; the alternatives, including doing nothing; the risk(s) and benefit(s) of the alternative treatment(s) or procedure(s); and the risk(s) and benefit(s) of doing nothing. The patient was provided information about the general risks and possible complications associated with the procedure. These may include, but are not limited to: failure to achieve desired goals, infection, bleeding, organ or nerve damage, allergic reactions, paralysis, and death. In addition, the patient was informed of those risks and complications associated to Spine-related procedures, such as failure to decrease pain; infection (i.e.: Meningitis, epidural or intraspinal abscess); bleeding (i.e.: epidural hematoma, subarachnoid hemorrhage, or any other type of intraspinal or peri-dural bleeding); organ or nerve damage (i.e.: Any type of peripheral nerve, nerve root, or spinal cord injury) with subsequent damage to sensory,  motor, and/or autonomic systems, resulting in permanent pain, numbness, and/or weakness of one or several areas of the body; allergic reactions; (i.e.: anaphylactic reaction); and/or death. Furthermore, the patient was informed  of those risks and complications associated with the medications. These include, but are not limited to: allergic reactions (i.e.: anaphylactic or anaphylactoid reaction(s)); adrenal axis suppression; blood sugar elevation that in diabetics may result in ketoacidosis or comma; water retention that in patients with history of congestive heart failure may result in shortness of breath, pulmonary edema, and decompensation with resultant heart failure; weight gain; swelling or edema; medication-induced neural toxicity; particulate matter embolism and blood vessel occlusion with resultant organ, and/or nervous system infarction; and/or aseptic necrosis of one or more joints. Finally, the patient was informed that Medicine is not an exact science; therefore, there is also the possibility of unforeseen or unpredictable risks and/or possible complications that may result in a catastrophic outcome. The patient indicated having understood very clearly. We have given the patient no guarantees and we have made no promises. Enough time was given to the patient to ask questions, all of which were answered to the patient's satisfaction. [name] has indicated that she wanted to continue with the procedure. Attestation: I, the ordering provider, attest that I have discussed with the patient the benefits, risks, side-effects, alternatives, likelihood of achieving goals, and potential problems during recovery for the procedure that I have provided informed consent. Date  Time: 12/01/2017 10:45 AM  Pre-Procedure Preparation:  Monitoring: As per clinic protocol. Respiration, ETCO2, SpO2, BP, heart rate and rhythm monitor placed and checked for adequate function Safety Precautions: Patient was assessed  for positional comfort and pressure points before starting the procedure. Time-out: I initiated and conducted the "Time-out" before starting the procedure, as per protocol. The patient was asked to participate by confirming the accuracy of the "Time Out" information. Verification of the correct person, site, and procedure were performed and confirmed by me, the nursing staff, and the patient. "Time-out" conducted as per Joint Commission's Universal Protocol (UP.01.01.01). Time: 1115  Description of Procedure:       Position: Prone with head of the table was raised to facilitate breathing. Target Area: The interlaminar space, initially targeting the lower laminar border of the superior vertebral body. Approach: Paramedial approach. Area Prepped: Entire Posterior Lumbar Region Prepping solution: ChloraPrep (2% chlorhexidine gluconate and 70% isopropyl alcohol) Safety Precautions: Aspiration looking for blood return was conducted prior to all injections. At no point did we inject any substances, as a needle was being advanced. No attempts were made at seeking any paresthesias. Safe injection practices and needle disposal techniques used. Medications properly checked for expiration dates. SDV (single dose vial) medications used. Description of the Procedure: Protocol guidelines were followed. The procedure needle was introduced through the skin, ipsilateral to the reported pain, and advanced to the target area. Bone was contacted and the needle walked caudad, until the lamina was cleared. The epidural space was identified using "loss-of-resistance technique" with 2-3 ml of PF-NaCl (0.9% NSS), in a 5cc LOR glass syringe. Vitals:   12/01/17 1118 12/01/17 1128 12/01/17 1138 12/01/17 1148  BP: 110/78 116/66 113/87 (!) 125/51  Pulse: 83     Resp: 18 18 16  (!) 23  Temp:  (!) 97.2 F (36.2 C)    SpO2: 96% 91% 92% 95%  Weight:      Height:        Start Time: 1115 hrs. End Time: 1118 hrs. Materials:   Needle(s) Type: Epidural needle Gauge: 17G Length: 3.5-in Medication(s): Please see orders for medications and dosing details.  8 CC solution made of 5 cc of preservative-free saline, 2 cc of 0.2% ropivacaine, 1 cc  of Decadron 10 mg/cc Imaging Guidance (Spinal):  Type of Imaging Technique: Fluoroscopy Guidance (Spinal) Indication(s): Assistance in needle guidance and placement for procedures requiring needle placement in or near specific anatomical locations not easily accessible without such assistance. Exposure Time: Please see nurses notes. Contrast: Before injecting any contrast, we confirmed that the patient did not have an allergy to iodine, shellfish, or radiological contrast. Once satisfactory needle placement was completed at the desired level, radiological contrast was injected. Contrast injected under live fluoroscopy. No contrast complications. See chart for type and volume of contrast used. Fluoroscopic Guidance: I was personally present during the use of fluoroscopy. "Tunnel Vision Technique" used to obtain the best possible view of the target area. Parallax error corrected before commencing the procedure. "Direction-depth-direction" technique used to introduce the needle under continuous pulsed fluoroscopy. Once target was reached, antero-posterior, oblique, and lateral fluoroscopic projection used confirm needle placement in all planes. Images permanently stored in EMR. Interpretation: I personally interpreted the imaging intraoperatively. Adequate needle placement confirmed in multiple planes. Appropriate spread of contrast into desired area was observed. No evidence of afferent or efferent intravascular uptake. No intrathecal or subarachnoid spread observed. Permanent images saved into the patient's record.  Antibiotic Prophylaxis:   Anti-infectives (From admission, onward)   None     Indication(s): None identified  Post-operative Assessment:  Post-procedure Vital Signs:   Pulse/HCG Rate: 8378 Temp: (!) 97.2 F (36.2 C) Resp: (!) 23 BP: (!) 125/51 SpO2: 95 %  EBL: None  Complications: No immediate post-treatment complications observed by team, or reported by patient.  Note: The patient tolerated the entire procedure well. A repeat set of vitals were taken after the procedure and the patient was kept under observation following institutional policy, for this type of procedure. Post-procedural neurological assessment was performed, showing return to baseline, prior to discharge. The patient was provided with post-procedure discharge instructions, including a section on how to identify potential problems. Should any problems arise concerning this procedure, the patient was given instructions to immediately contact us, at any time, without hesitation. In any case, we plan to contact the patient by telephone for a follow-up status report regarding this interventional procedure.  Comments:  No additional relevant information. 5 out of 5 strength bilateral lower extremity: Plantar flexion, dorsiflexion, knee flexion, knee extension.  Plan of Care   Imaging Orders     DG C-Arm 1-60 Min-No Report Procedure Orders    No procedure(s) ordered today    Medications ordered for procedure: Meds ordered this encounter  Medications  . fentaNYL (SUBLIMAZE) injection 25-100 mcg    Make sure Narcan is available in the pyxis when using this medication. In the event of respiratory depression (RR< 8/min): Titrate NARCAN (naloxone) in increments of 0.1 to 0.2 mg IV at 2-3 minute intervals, until desired degree of reversal.  . iopamidol (ISOVUE-M) 41 % intrathecal injection 10 mL  . ropivacaine (PF) 2 mg/mL (0.2%) (NAROPIN) injection 2 mL  . sodium chloride flush (NS) 0.9 % injection 2 mL  . lidocaine (PF) (XYLOCAINE) 1 % injection 4.5 mL  . dexamethasone (DECADRON) injection 10 mg   Medications administered: We administered fentaNYL, iopamidol, ropivacaine (PF) 2 mg/mL  (0.2%), sodium chloride flush, lidocaine (PF), and dexamethasone.  See the medical record for exact dosing, route, and time of administration.  New Prescriptions   No medications on file   Disposition: Discharge home  Discharge Date & Time: 12/01/2017; 1150 hrs.   Physician-requested Follow-up: No follow-ups on file.  Future Appointments  Date Time Provider [place]  12/08/2017 11:30 AM Vevelyn Francois, NP [facility] None  05/18/2018  8:30 AM [facility]  05/19/2018 10:15 AM [facility] None  05/19/2018 10:45 AM Cammie Sickle, MD [facility] None   Primary Care Physician: Cletis Athens, MD Location: [facility] Note by: Gillis Santa, MD Date: 12/01/2017; Time: 11:54 AM  Disclaimer:  Medicine is not an exact science. The only guarantee in medicine is that nothing is guaranteed. It is important to note that the decision to proceed with this intervention was based on the information collected from the patient. The Data and conclusions were drawn from the patient's questionnaire, the interview, and the physical examination. Because the information was provided in large part by the patient, it cannot be guaranteed that it has not been purposely or unconsciously manipulated. Every effort has been made to obtain as much relevant data as possible for this evaluation. It is important to note that the conclusions that lead to this procedure are derived in large part from the available data. Always take into account that the treatment will also be dependent on availability of resources and existing treatment guidelines, considered by other Pain Management Practitioners as being common knowledge and practice, at the time of the intervention. For Medico-Legal purposes, it is also important to point out that variation in procedural techniques and pharmacological choices are the acceptable norm. The indications, contraindications,  technique, and results of the above procedure should only be interpreted and judged by a Board-Certified Interventional Pain Specialist with extensive familiarity and expertise in the same exact procedure and technique.

## 2017-12-02 ENCOUNTER — Telehealth: Payer: Self-pay | Admitting: *Deleted

## 2017-12-02 NOTE — Telephone Encounter (Signed)
Spoke with patient re; procedure on yesterday denies any problems.

## 2017-12-08 ENCOUNTER — Ambulatory Visit
Admission: RE | Admit: 2017-12-08 | Discharge: 2017-12-08 | Disposition: A | Discharge status: Home / Self Care | Payer: Medicare Other | Source: Ambulatory Visit | Attending: Nurse Practitioner | Admitting: Nurse Practitioner

## 2017-12-08 ENCOUNTER — Encounter: Payer: Self-pay | Admitting: Nurse Practitioner

## 2017-12-08 ENCOUNTER — Ambulatory Visit: Payer: Medicare Other | Attending: Nurse Practitioner | Admitting: Nurse Practitioner

## 2017-12-08 VITALS — BP 129/91 | HR 87 | Temp 97.6°F | Resp 18 | Ht 66.5 in | Wt 165.0 lb

## 2017-12-08 DIAGNOSIS — G894 Chronic pain syndrome: Secondary | ICD-10-CM | POA: Diagnosis not present

## 2017-12-08 DIAGNOSIS — M545 Low back pain: Secondary | ICD-10-CM

## 2017-12-08 DIAGNOSIS — Z5181 Encounter for therapeutic drug level monitoring: Secondary | ICD-10-CM | POA: Insufficient documentation

## 2017-12-08 DIAGNOSIS — M4326 Fusion of spine, lumbar region: Secondary | ICD-10-CM | POA: Diagnosis not present

## 2017-12-08 DIAGNOSIS — G20 Parkinson's disease: Secondary | ICD-10-CM | POA: Diagnosis not present

## 2017-12-08 DIAGNOSIS — M7918 Myalgia, other site: Secondary | ICD-10-CM | POA: Diagnosis not present

## 2017-12-08 DIAGNOSIS — M5136 Other intervertebral disc degeneration, lumbar region: Secondary | ICD-10-CM | POA: Insufficient documentation

## 2017-12-08 DIAGNOSIS — M79604 Pain in right leg: Secondary | ICD-10-CM

## 2017-12-08 DIAGNOSIS — G8929 Other chronic pain: Secondary | ICD-10-CM

## 2017-12-08 DIAGNOSIS — F329 Major depressive disorder, single episode, unspecified: Secondary | ICD-10-CM | POA: Diagnosis not present

## 2017-12-08 DIAGNOSIS — Z87891 Personal history of nicotine dependence: Secondary | ICD-10-CM | POA: Insufficient documentation

## 2017-12-08 DIAGNOSIS — M549 Dorsalgia, unspecified: Secondary | ICD-10-CM | POA: Insufficient documentation

## 2017-12-08 DIAGNOSIS — F419 Anxiety disorder, unspecified: Secondary | ICD-10-CM | POA: Insufficient documentation

## 2017-12-08 DIAGNOSIS — M5416 Radiculopathy, lumbar region: Secondary | ICD-10-CM | POA: Insufficient documentation

## 2017-12-08 DIAGNOSIS — M546 Pain in thoracic spine: Secondary | ICD-10-CM | POA: Diagnosis not present

## 2017-12-08 DIAGNOSIS — Z981 Arthrodesis status: Secondary | ICD-10-CM | POA: Insufficient documentation

## 2017-12-08 DIAGNOSIS — M5135 Other intervertebral disc degeneration, thoracolumbar region: Secondary | ICD-10-CM | POA: Diagnosis not present

## 2017-12-08 DIAGNOSIS — Z79899 Other long term (current) drug therapy: Secondary | ICD-10-CM | POA: Diagnosis not present

## 2017-12-08 DIAGNOSIS — Z86010 Personal history of colonic polyps: Secondary | ICD-10-CM | POA: Diagnosis not present

## 2017-12-08 MED ORDER — ORPHENADRINE CITRATE 30 MG/ML IJ SOLN
60.0000 mg | Freq: Once | INTRAMUSCULAR | Status: AC
  Administered 2017-12-08: 60 mg via INTRAMUSCULAR

## 2017-12-08 MED ORDER — ORPHENADRINE CITRATE 30 MG/ML IJ SOLN
INTRAMUSCULAR | Status: AC
  Filled 2017-12-08: qty 2

## 2017-12-08 MED ORDER — PREGABALIN 25 MG PO CAPS: 25.0000 mg | ORAL_CAPSULE | Freq: Four times a day (QID) | ORAL | 2 refills | Status: DC

## 2017-12-08 MED ORDER — PREGABALIN 25 MG PO CAPS: 25.0000 mg | ORAL_CAPSULE | Freq: Four times a day (QID) | ORAL | 0 refills | Status: DC

## 2017-12-08 NOTE — Progress Notes (Signed)
Patient's Name: Sarah Donaldson  MRN: [id number]  Referring Provider: Cletis Athens, MD  DOB: [birth year]  PCP: Cletis Athens, MD  DOS: 12/08/2017  Note by: Vevelyn Francois NP  Service setting: Ambulatory outpatient  Specialty: Interventional Pain Management  Location: [facility] (AMB) Pain Management Facility    Patient type: Established    Primary Reason(s) for Visit: Encounter for prescription drug management & post-procedure evaluation of chronic illness with mild to moderate exacerbation(Level of risk: moderate) CC: Back Pain (low and right)  HPI  Sarah Donaldson is a 67 y.o. year old, adult patient, who comes today for a post-procedure evaluation and medication management. She has Anxiety; Back ache; Low back pain with sciatica; Colon polyp; Clinical depression; Difficulty hearing; H/O adenomatous polyp of colon; Hypercholesteremia; Adiposity; Idiopathic Parkinson's disease ([place]); REM sleep behavior disorder; Detached retina; Disordered sleep; Has a tremor; Hallux limitus; Lymphoma ([place]); Follicular lymphoma of extranodal and solid organ sites [facility]); Benign neoplasm of hand; Hearing loss; Numbness and tingling; Sleep difficulties; Chronic pain syndrome; Chronic pain of right lower extremity; Chronic myofascial pain; Obesity, unspecified; Sleep disorder; Backache; Bilateral low back pain with sciatica; Low back pain; Lumbar radiculopathy; Depression; Retinal detachment; Hx of adenomatous colonic polyps; Tremor; Parkinson's disease ([place]); Sleep behavior disorder, REM; and Chronic upper back pain on their problem list. Her primarily concern today is the Back Pain (low and right)  Pain Assessment: Location: Right, Lower Back Radiating:   Onset: More than a month ago Duration: Chronic pain Quality: Aching, Constant Severity: 7 /10 (self-reported pain score)  Note: Reported level is compatible with observation. Clinically the patient looks like a 3/10 A 3/10 is viewed as "Moderate" and described as  significantly interfering with activities of daily living (ADL). It becomes difficult to feed, bathe, get dressed, get on and off the toilet or to perform personal hygiene functions. Difficult to get in and out of bed or a chair without assistance. Very distracting. With effort, it can be ignored when deeply involved in activities. Information on the proper use of the pain scale provided to the patient today. When using our objective Pain Scale, levels between 6 and 10/10 are said to belong in an emergency room, as it progressively worsens from a 6/10, described as severely limiting, requiring emergency care not usually available at an outpatient pain management facility. At a 6/10 level, communication becomes difficult and requires great effort. Assistance to reach the emergency department may be required. Facial flushing and profuse sweating along with potentially dangerous increases in heart rate and blood pressure will be evident. Effect on ADL:   Timing: Constant Modifying factors: medications, heat  Sarah Donaldson was last seen on 11/17/2017 for a procedure. During today's appointment we reviewed Sarah Donaldson's post-procedure results, as well as her outpatient medication regimen. She admits that she is now sleeping throughtout the day. She feels like the Lyrica 77m is too strong. She is not sure that it is even that effective for her pain. She did admit that she suffered a fall out of bed. She admits that this was 2 days after her LESI. She feels like the LESI was effective until she fell. She admits that she is having increased pain in her back and it continues to go down her legs. She denies seeking treatment at the time of the fall.   Further details on both, my assessment(s), as well as the proposed treatment plan, please see below.  Post-Procedure Assessment  12/01/2017 Procedure: R-LESI  Pre-procedure pain score:  10/10 Post-procedure  pain score: 0/10         Influential Factors: BMI: 26.23  kg/m Intra-procedural challenges: None observed.         Assessment challenges: None detected.              Reported side-effects: None.        Post-procedural adverse reactions or complications: None reported         Sedation: Please see nurses note. When no sedatives are used, the analgesic levels obtained are directly associated to the effectiveness of the local anesthetics. However, when sedation is provided, the level of analgesia obtained during the initial 1 hour following the intervention, is believed to be the result of a combination of factors. These factors may include, but are not limited to: 1. The effectiveness of the local anesthetics used. 2. The effects of the analgesic(s) and/or anxiolytic(s) used. 3. The degree of discomfort experienced by the patient at the time of the procedure. 4. The patients ability and reliability in recalling and recording the events. 5. The presence and influence of possible secondary gains and/or psychosocial factors. Reported result: Relief experienced during the 1st hour after the procedure: 50 % (Ultra-Short Term Relief)            Interpretative annotation: Clinically appropriate result. Analgesia during this period is likely to be Local Anesthetic and/or IV Sedative (Analgesic/Anxiolytic) related.          Effects of local anesthetic: The analgesic effects attained during this period are directly associated to the localized infiltration of local anesthetics and therefore cary significant diagnostic value as to the etiological location, or anatomical origin, of the pain. Expected duration of relief is directly dependent on the pharmacodynamics of the local anesthetic used. Long-acting (4-6 hours) anesthetics used.  Reported result: Relief during the next 4 to 6 hour after the procedure: 50 % (Short-Term Relief)            Interpretative annotation: Clinically appropriate result. Analgesia during this period is likely to be Local Anesthetic-related.           Long-term benefit: Defined as the period of time past the expected duration of local anesthetics (1 hour for short-acting and 4-6 hours for long-acting). With the possible exception of prolonged sympathetic blockade from the local anesthetics, benefits during this period are typically attributed to, or associated with, other factors such as analgesic sensory neuropraxia, antiinflammatory effects, or beneficial biochemical changes provided by agents other than the local anesthetics.  Reported result: Extended relief following procedure: 50 % (Long-Term Relief)            Interpretative annotation: Clinically appropriate result. Good relief. No permanent benefit expected. Inflammation plays a part in the etiology to the pain.          Current benefits: Defined as reported results that persistent at this point in time.   Analgesia: <50 %            Function: Somewhat improved ROM: Somewhat improved Interpretative annotation: Recurrence of symptoms.    Effective diagnostic intervention.        secondary to patient falling out of bed  Interpretation: Results would suggest a successful diagnostic intervention.                  Plan:  Please see "Plan of Care" for details.                Laboratory Chemistry  Inflammation Markers (CRP: Acute Phase) (ESR: Chronic Phase) Lab Results  Component Value Date   ESRSEDRATE 4 06/07/2013                         Rheumatology Markers No results found for: RF, ANA, LABURIC, URICUR, LYMEIGGIGMAB, LYMEABIGMQN                      Renal Function Markers Lab Results  Component Value Date   BUN 31 (H) 11/19/2017   CREATININE 0.61 11/19/2017   GFRAA >60 11/19/2017   GFRNONAA >60 11/19/2017                              Hepatic Function Markers Lab Results  Component Value Date   AST 18 11/19/2017   ALT 8 (L) 11/19/2017   ALBUMIN 3.9 11/19/2017   ALKPHOS 105 11/19/2017                        Electrolytes Lab Results  Component Value Date    NA 140 11/19/2017   K 4.0 11/19/2017   CL 108 11/19/2017   CALCIUM 9.2 11/19/2017                        Neuropathy Markers No results found for: VITAMINB12, FOLATE, HGBA1C, HIV                      Bone Pathology Markers No results found for: VD25OH, VD125OH2TOT, WR6045WU9, WJ1914NW2, 25OHVITD1, 25OHVITD2, 25OHVITD3, TESTOFREE, TESTOSTERONE                       Coagulation Parameters Lab Results  Component Value Date   PLT 185 11/19/2017                        Cardiovascular Markers Lab Results  Component Value Date   HGB 14.5 11/19/2017   HCT 42.7 11/19/2017                         CA Markers No results found for: CEA, CA125, LABCA2                      Note: Lab results reviewed.  Recent Diagnostic Imaging Results  DG C-Arm 1-60 Min-No Report Fluoroscopy was utilized by the requesting physician.  No radiographic  interpretation.   Complexity Note: Imaging results reviewed. Results shared with Sarah Donaldson, using Layman's terms.                         Meds   Current Outpatient Medications:  .  Acetaminophen (TYLENOL ARTHRITIS PAIN PO), Take by mouth 2 (two) times daily., Disp: , Rfl:  .  carbidopa-levodopa (SINEMET IR) 25-100 MG per tablet, TAKE 2 tablets in a.m. and 1 tab BY MOUTH every 4 hrs, Disp: , Rfl: 0 .  clonazePAM (KLONOPIN) 1 MG tablet, Take 2 mg by mouth daily. , Disp: , Rfl:  .  furosemide (LASIX) 40 MG tablet, Take 20 mg by mouth as needed for fluid. Twice a week, Disp: , Rfl:  .  naproxen (EC NAPROSYN) 500 MG EC tablet, Take 1 tablet (500 mg total) by mouth 2 (two) times daily with a meal., Disp: 60 tablet, Rfl: 0 .  neomycin-polymyxin b-dexamethasone (MAXITROL) 3.5-10000-0.1 OINT, Place  1 application into the left eye at bedtime., Disp: , Rfl: 2 .  omeprazole (PRILOSEC) 20 MG capsule, Take 1 capsule (20 mg total) by mouth daily., Disp: 1 capsule, Rfl: 3 .  pramipexole (MIRAPEX) 1.5 MG tablet, Take 1.5 mg by mouth 3 (three) times daily., Disp: , Rfl:  5 .  prednisoLONE acetate (PRED FORTE) 1 % ophthalmic suspension, Place 1 drop into the left eye daily., Disp: , Rfl: 5 .  promethazine (PHENERGAN) 12.5 MG tablet, Take 12.5 mg by mouth every 6 (six) hours as needed for nausea or vomiting., Disp: , Rfl:  .  ranitidine (ZANTAC) 150 MG capsule, Take 150 mg by mouth every evening., Disp: , Rfl:  .  selegiline (ELDEPRYL) 5 MG capsule, TAKE 1 CAPSULE BY MOUTH WITH BREAKFAST AND LUNCH., Disp: , Rfl: 1 .  traZODone (DESYREL) 50 MG tablet, TAKE 1-2 TABLETS NIGHTLY AS NEEDED FOR SLEEP., Disp: , Rfl: 1 .  pregabalin (LYRICA) 25 MG capsule, Take 1 capsule (25 mg total) by mouth 4 (four) times daily., Disp: 120 capsule, Rfl: 2  ROS  Constitutional: Denies any fever or chills Gastrointestinal: No reported hemesis, hematochezia, vomiting, or acute GI distress Musculoskeletal: Denies any acute onset joint swelling, redness, loss of ROM, or weakness Neurological: No reported episodes of acute onset apraxia, aphasia, dysarthria, agnosia, amnesia, paralysis, loss of coordination, or loss of consciousness  Allergies  Sarah Donaldson is allergic to codeine sulfate and gabapentin.  [place]  Drug: Sarah Donaldson  reports that she does not use drugs. Alcohol:  reports that she drinks alcohol. Tobacco:  reports that she has quit smoking. She has never used smokeless tobacco. Medical:  has a past medical history of Cancer ([place]), Parkinson disease ([place]), and Parkinson disease ([place]). Surgical: Sarah Donaldson  has a past surgical history that includes Retinal detachment surgery; Bunionectomy; Nasal septum surgery; and Back surgery. Family: family history includes Heart disease in her mother.  Constitutional Exam  General appearance: Well nourished, well developed, and well hydrated. In no apparent acute distress Vitals:   12/08/17 1115  BP: (!) 129/91  Pulse: 87  Resp: 18  Temp: 97.6 F (36.4 C)  TempSrc: Oral  SpO2: 98%  Weight: 165 lb (74.8 kg)  Height: 5' 6.5" (1.689 m)   Psych/Mental status: Alert, oriented x 3 (person, place, & time)       Eyes: PERLA Respiratory: No evidence of acute respiratory distress   Thoracic Spine Area Exam  Skin & Axial Inspection: No masses, redness, or swelling Alignment: Symmetrical Functional ROM: Unrestricted ROM Stability: No instability detected Muscle Tone/Strength: Functionally intact. No obvious neuro-muscular anomalies detected. Sensory (Neurological): Unimpaired Muscle strength & Tone: Complains of area being tender to palpation  Lumbar Spine Area Exam  Skin & Axial Inspection: Well healed scar from previous spine surgery detected Alignment: Symmetrical Functional ROM: Unrestricted ROM       Stability: No instability detected Muscle Tone/Strength: Functionally intact. No obvious neuro-muscular anomalies detected. Sensory (Neurological): Unimpaired Palpation: Complains of area being tender to palpation       Provocative Tests: Lumbar Hyperextension and rotation test: evaluation deferred today       Lumbar Lateral bending test: evaluation deferred today       Patrick's Maneuver: evaluation deferred today                    Gait & Posture Assessment  Ambulation: Unassisted Gait: Relatively normal for age and body habitus Posture: WNL   Lower Extremity Exam  Side: Right lower extremity  Side: Left lower extremity  Skin & Extremity Inspection: Skin color, temperature, and hair growth are WNL. No peripheral edema or cyanosis. No masses, redness, swelling, asymmetry, or associated skin lesions. No contractures.  Skin & Extremity Inspection: Skin color, temperature, and hair growth are WNL. No peripheral edema or cyanosis. No masses, redness, swelling, asymmetry, or associated skin lesions. No contractures.  Functional ROM: Unrestricted ROM          Functional ROM: Unrestricted ROM          Muscle Tone/Strength: Functionally intact. No obvious neuro-muscular anomalies detected.  Muscle Tone/Strength: Functionally  intact. No obvious neuro-muscular anomalies detected.  Sensory (Neurological): Unimpaired  Sensory (Neurological): Unimpaired  Palpation: No palpable anomalies  Palpation: No palpable anomalies   Assessment  Primary Diagnosis & Pertinent Problem List: The primary encounter diagnosis was Chronic myofascial pain. Diagnoses of Chronic bilateral low back pain, with sciatica presence unspecified, Chronic upper back pain, Chronic pain syndrome, and Chronic pain of right lower extremity were also pertinent to this visit.  Status Diagnosis  Persistent Worsening Worsening 1. Chronic myofascial pain   2. Chronic bilateral low back pain, with sciatica presence unspecified   3. Chronic upper back pain   4. Chronic pain syndrome   5. Chronic pain of right lower extremity     Problems updated and reviewed during this visit: Problem  Chronic Upper Back Pain   Plan of Care  Pharmacotherapy (Medications Ordered): Meds ordered this encounter  Medications  . orphenadrine (NORFLEX) injection 60 mg  . pregabalin (LYRICA) 25 MG capsule    Sig: Take 1 capsule (25 mg total) by mouth 4 (four) times daily.    Dispense:  120 capsule    Refill:  2    Do not place this medication, or any other prescription from our practice, on "Automatic Refill". Patient may have prescription filled one day early if pharmacy is closed on scheduled refill date.    Order Specific Question:   Supervising Provider    Answer:   Milinda Pointer [phone]   New Prescriptions   PREGABALIN (LYRICA) 25 MG CAPSULE    Take 1 capsule (25 mg total) by mouth 4 (four) times daily.   Medications administered today: We administered orphenadrine. Lab-work, procedure(s), and/or referral(s): Orders Placed This Encounter  Procedures  . DG Thoracic Spine 2 View  . DG Lumbar Spine 2-3 Views   Imaging and/or referral(s): None  Interventional therapies: Planned, scheduled, and/or pending:   Not at this time.    Provider-requested  follow-up: Return in about 3 months (around 03/10/2018) for MedMgmt with Me Donella Stade Edison Pace).  Future Appointments  Date Time Provider [place]  03/07/2018 11:00 AM Vevelyn Francois, NP [facility] None  05/18/2018  8:30 AM [facility]  05/19/2018 10:15 AM [facility] None  05/19/2018 10:45 AM Cammie Sickle, MD [facility] None   Primary Care Physician: Cletis Athens, MD Location: [facility] Note by: Vevelyn Francois NP Date: 12/08/2017; Time: 3:27 PM  Pain Score Disclaimer: We use the NRS-11 scale. This is a self-reported, subjective measurement of pain severity with only modest accuracy. It is used primarily to identify changes within a particular patient. It must be understood that outpatient pain scales are significantly less accurate that those used for research, where they can be applied under ideal controlled circumstances with minimal exposure to variables. In reality, the score is likely to be a combination of pain intensity and pain  affect, where pain affect describes the degree of emotional arousal or changes in action readiness caused by the sensory experience of pain. Factors such as social and work situation, setting, emotional state, anxiety levels, expectation, and prior pain experience may influence pain perception and show large inter-individual differences that may also be affected by time variables.  Patient instructions provided during this appointment: Patient Instructions  ____________________________________________________________________________________________  Medication Rules  Applies to: All patients receiving prescriptions (written or electronic).  Pharmacy of record: Pharmacy where electronic prescriptions will be sent. If written prescriptions are taken to a different pharmacy, please inform the nursing staff. The pharmacy listed in the electronic medical record should be the one where you would  like electronic prescriptions to be sent.  Prescription refills: Only during scheduled appointments. Applies to both, written and electronic prescriptions.  NOTE: The following applies primarily to controlled substances (Opioid* Pain Medications).   Patient's responsibilities: 1. Pain Pills: Bring all pain pills to every appointment (except for procedure appointments). 2. Pill Bottles: Bring pills in original pharmacy bottle. Always bring newest bottle. Bring bottle, even if empty. 3. Medication refills: You are responsible for knowing and keeping track of what medications you need refilled. The day before your appointment, write a list of all prescriptions that need to be refilled. Bring that list to your appointment and give it to the admitting nurse. Prescriptions will be written only during appointments. If you forget a medication, it will not be "Called in", "Faxed", or "electronically sent". You will need to get another appointment to get these prescribed. 4. Prescription Accuracy: You are responsible for carefully inspecting your prescriptions before leaving our office. Have the discharge nurse carefully go over each prescription with you, before taking them home. Make sure that your name is accurately spelled, that your address is correct. Check the name and dose of your medication to make sure it is accurate. Check the number of pills, and the written instructions to make sure they are clear and accurate. Make sure that you are given enough medication to last until your next medication refill appointment. 5. Taking Medication: Take medication as prescribed. Never take more pills than instructed. Never take medication more frequently than prescribed. Taking less pills or less frequently is permitted and encouraged, when it comes to controlled substances (written prescriptions).  6. Inform other Doctors: Always inform, all of your healthcare providers, of all the medications you take. 7. Pain  Medication from other Providers: You are not allowed to accept any additional pain medication from any other Doctor or Healthcare provider. There are two exceptions to this rule. (see below) In the event that you require additional pain medication, you are responsible for notifying us, as stated below. 8. Medication Agreement: You are responsible for carefully reading and following our Medication Agreement. This must be signed before receiving any prescriptions from our practice. Safely store a copy of your signed Agreement. Violations to the Agreement will result in no further prescriptions. (Additional copies of our Medication Agreement are available upon request.) 9. Laws, Rules, & Regulations: All patients are expected to follow all Federal and [employer], [employer], Rules, [employer]. Ignorance of the Laws does not constitute a valid excuse. The use of any illegal substances is prohibited. 10. Adopted CDC guidelines & recommendations: Target dosing levels will be at or below 60 MME/day. Use of benzodiazepines** is not recommended.  Exceptions: There are only two exceptions to the rule of not receiving pain medications from other Healthcare Providers. 1. Exception #1 (  Emergencies): In the event of an emergency (i.e.: accident requiring emergency care), you are allowed to receive additional pain medication. However, you are responsible for: As soon as you are able, call our office [phone], at any time of the day or night, and leave a message stating your name, the date and nature of the emergency, and the name and dose of the medication prescribed. In the event that your call is answered by a member of our staff, make sure to document and save the date, time, and the name of the person that took your information.  2. Exception #2 (Planned Surgery): In the event that you are scheduled by another doctor or dentist to have any type of surgery or procedure, you are allowed (for a period no longer than  30 days), to receive additional pain medication, for the acute post-op pain. However, in this case, you are responsible for picking up a copy of our "Post-op Pain Management for Surgeons" handout, and giving it to your surgeon or dentist. This document is available at our office, and does not require an appointment to obtain it. Simply go to our office during business hours (Monday-Thursday from 8:00 AM to 4:00 PM) (Friday 8:00 AM to 12:00 Noon) or if you have a scheduled appointment with [place], prior to your surgery, and ask for it by name. In addition, you will need to provide [place] with your name, name of your surgeon, type of surgery, and date of procedure or surgery.  *Opioid medications include: morphine, codeine, oxycodone, oxymorphone, hydrocodone, hydromorphone, meperidine, tramadol, tapentadol, buprenorphine, fentanyl, methadone. **Benzodiazepine medications include: diazepam (Valium), alprazolam (Xanax), clonazepam (Klonopine), lorazepam (Ativan), clorazepate (Tranxene), chlordiazepoxide (Librium), estazolam (Prosom), oxazepam (Serax), temazepam (Restoril), triazolam (Halcion) (Last updated: 10/07/2017) ____________________________________________________________________________________________

## 2017-12-08 NOTE — Patient Instructions (Addendum)
____________________________________________________________________________________________  Medication Rules  Applies to: All patients receiving prescriptions (written or electronic).  Pharmacy of record: Pharmacy where electronic prescriptions will be sent. If written prescriptions are taken to a different pharmacy, please inform the nursing staff. The pharmacy listed in the electronic medical record should be the one where you would like electronic prescriptions to be sent.  Prescription refills: Only during scheduled appointments. Applies to both, written and electronic prescriptions.  NOTE: The following applies primarily to controlled substances (Opioid* Pain Medications).   Patient's responsibilities: 1. Pain Pills: Bring all pain pills to every appointment (except for procedure appointments). 2. Pill Bottles: Bring pills in original pharmacy bottle. Always bring newest bottle. Bring bottle, even if empty. 3. Medication refills: You are responsible for knowing and keeping track of what medications you need refilled. The day before your appointment, write a list of all prescriptions that need to be refilled. Bring that list to your appointment and give it to the admitting nurse. Prescriptions will be written only during appointments. If you forget a medication, it will not be "Called in", "Faxed", or "electronically sent". You will need to get another appointment to get these prescribed. 4. Prescription Accuracy: You are responsible for carefully inspecting your prescriptions before leaving our office. Have the discharge nurse carefully go over each prescription with you, before taking them home. Make sure that your name is accurately spelled, that your address is correct. Check the name and dose of your medication to make sure it is accurate. Check the number of pills, and the written instructions to make sure they are clear and accurate. Make sure that you are given enough medication to last  until your next medication refill appointment. 5. Taking Medication: Take medication as prescribed. Never take more pills than instructed. Never take medication more frequently than prescribed. Taking less pills or less frequently is permitted and encouraged, when it comes to controlled substances (written prescriptions).  6. Inform other Doctors: Always inform, all of your healthcare providers, of all the medications you take. 7. Pain Medication from other Providers: You are not allowed to accept any additional pain medication from any other Doctor or Healthcare provider. There are two exceptions to this rule. (see below) In the event that you require additional pain medication, you are responsible for notifying us, as stated below. 8. Medication Agreement: You are responsible for carefully reading and following our Medication Agreement. This must be signed before receiving any prescriptions from our practice. Safely store a copy of your signed Agreement. Violations to the Agreement will result in no further prescriptions. (Additional copies of our Medication Agreement are available upon request.) 9. Laws, Rules, & Regulations: All patients are expected to follow all Federal and State Laws, Statutes, Rules, & Regulations. Ignorance of the Laws does not constitute a valid excuse. The use of any illegal substances is prohibited. 10. Adopted CDC guidelines & recommendations: Target dosing levels will be at or below 60 MME/day. Use of benzodiazepines** is not recommended.  Exceptions: There are only two exceptions to the rule of not receiving pain medications from other Healthcare Providers. 1. Exception #1 (Emergencies): In the event of an emergency (i.e.: accident requiring emergency care), you are allowed to receive additional pain medication. However, you are responsible for: As soon as you are able, call our office [phone], at any time of the day or night, and leave a message stating your name, the  date and nature of the emergency, and the name and dose of the medication   prescribed. In the event that your call is answered by a member of our staff, make sure to document and save the date, time, and the name of the person that took your information.  2. Exception #2 (Planned Surgery): In the event that you are scheduled by another doctor or dentist to have any type of surgery or procedure, you are allowed (for a period no longer than 30 days), to receive additional pain medication, for the acute post-op pain. However, in this case, you are responsible for picking up a copy of our "Post-op Pain Management for Surgeons" handout, and giving it to your surgeon or dentist. This document is available at our office, and does not require an appointment to obtain it. Simply go to our office during business hours (Monday-Thursday from 8:00 AM to 4:00 PM) (Friday 8:00 AM to 12:00 Noon) or if you have a scheduled appointment with us, prior to your surgery, and ask for it by name. In addition, you will need to provide us with your name, name of your surgeon, type of surgery, and date of procedure or surgery.  *Opioid medications include: morphine, codeine, oxycodone, oxymorphone, hydrocodone, hydromorphone, meperidine, tramadol, tapentadol, buprenorphine, fentanyl, methadone. **Benzodiazepine medications include: diazepam (Valium), alprazolam (Xanax), clonazepam (Klonopine), lorazepam (Ativan), clorazepate (Tranxene), chlordiazepoxide (Librium), estazolam (Prosom), oxazepam (Serax), temazepam (Restoril), triazolam (Halcion) (Last updated: 10/07/2017) ____________________________________________________________________________________________    

## 2017-12-08 NOTE — Progress Notes (Signed)
Nursing Pain Medication Assessment:  Safety precautions to be maintained throughout the outpatient stay will include: orient to surroundings, keep bed in low position, maintain call bell within reach at all times, provide assistance with transfer out of bed and ambulation.  Medication Inspection Compliance: Sarah Donaldson did not comply with our request to bring her pills to be counted. She was reminded that bringing the medication bottles, even when empty, is a requirement.  Medication: None brought in. Pill/Patch Count: None available to be counted. Bottle Appearance: No container available. Did not bring bottle(s) to appointment. Filled Date: N/A Last Medication intake:  Yesterday

## 2017-12-09 DIAGNOSIS — H43811 Vitreous degeneration, right eye: Secondary | ICD-10-CM | POA: Diagnosis not present

## 2017-12-09 NOTE — Progress Notes (Signed)
Results were reviewed and found to be: mildly abnormal  No acute injury or pathology identified  Review would suggest interventional pain management techniques may be of benefit 

## 2017-12-24 DIAGNOSIS — M7051 Other bursitis of knee, right knee: Secondary | ICD-10-CM | POA: Diagnosis not present

## 2017-12-24 DIAGNOSIS — F419 Anxiety disorder, unspecified: Secondary | ICD-10-CM | POA: Diagnosis not present

## 2017-12-24 DIAGNOSIS — M7052 Other bursitis of knee, left knee: Secondary | ICD-10-CM | POA: Diagnosis not present

## 2017-12-24 DIAGNOSIS — M792 Neuralgia and neuritis, unspecified: Secondary | ICD-10-CM | POA: Diagnosis not present

## 2018-01-11 ENCOUNTER — Other Ambulatory Visit: Payer: Self-pay

## 2018-01-11 ENCOUNTER — Ambulatory Visit: Payer: Medicare Other | Attending: Nurse Practitioner | Admitting: Nurse Practitioner

## 2018-01-11 ENCOUNTER — Encounter: Payer: Self-pay | Admitting: Nurse Practitioner

## 2018-01-11 VITALS — BP 135/84 | HR 80 | Temp 97.8°F | Resp 18 | Wt 165.0 lb

## 2018-01-11 DIAGNOSIS — C8290 Follicular lymphoma, unspecified, unspecified site: Secondary | ICD-10-CM | POA: Diagnosis not present

## 2018-01-11 DIAGNOSIS — G8929 Other chronic pain: Secondary | ICD-10-CM | POA: Diagnosis not present

## 2018-01-11 DIAGNOSIS — Z5181 Encounter for therapeutic drug level monitoring: Secondary | ICD-10-CM | POA: Diagnosis not present

## 2018-01-11 DIAGNOSIS — H9190 Unspecified hearing loss, unspecified ear: Secondary | ICD-10-CM | POA: Diagnosis not present

## 2018-01-11 DIAGNOSIS — Z87891 Personal history of nicotine dependence: Secondary | ICD-10-CM | POA: Diagnosis not present

## 2018-01-11 DIAGNOSIS — Z79899 Other long term (current) drug therapy: Secondary | ICD-10-CM | POA: Insufficient documentation

## 2018-01-11 DIAGNOSIS — G20 Parkinson's disease: Secondary | ICD-10-CM | POA: Insufficient documentation

## 2018-01-11 DIAGNOSIS — E669 Obesity, unspecified: Secondary | ICD-10-CM | POA: Diagnosis not present

## 2018-01-11 DIAGNOSIS — M79604 Pain in right leg: Secondary | ICD-10-CM | POA: Diagnosis not present

## 2018-01-11 DIAGNOSIS — M5116 Intervertebral disc disorders with radiculopathy, lumbar region: Secondary | ICD-10-CM | POA: Insufficient documentation

## 2018-01-11 DIAGNOSIS — F419 Anxiety disorder, unspecified: Secondary | ICD-10-CM | POA: Diagnosis not present

## 2018-01-11 DIAGNOSIS — G894 Chronic pain syndrome: Secondary | ICD-10-CM | POA: Insufficient documentation

## 2018-01-11 DIAGNOSIS — Z888 Allergy status to other drugs, medicaments and biological substances status: Secondary | ICD-10-CM | POA: Diagnosis not present

## 2018-01-11 DIAGNOSIS — E7800 Pure hypercholesterolemia, unspecified: Secondary | ICD-10-CM | POA: Insufficient documentation

## 2018-01-11 DIAGNOSIS — Z6826 Body mass index (BMI) 26.0-26.9, adult: Secondary | ICD-10-CM | POA: Diagnosis not present

## 2018-01-11 DIAGNOSIS — M5416 Radiculopathy, lumbar region: Secondary | ICD-10-CM

## 2018-01-11 DIAGNOSIS — F329 Major depressive disorder, single episode, unspecified: Secondary | ICD-10-CM | POA: Insufficient documentation

## 2018-01-11 MED ORDER — ORPHENADRINE CITRATE 30 MG/ML IJ SOLN
60.0000 mg | Freq: Once | INTRAMUSCULAR | Status: AC
  Administered 2018-01-11: 60 mg via INTRAMUSCULAR
  Filled 2018-01-11: qty 2

## 2018-01-11 MED ORDER — KETOROLAC TROMETHAMINE 60 MG/2ML IM SOLN
60.0000 mg | Freq: Once | INTRAMUSCULAR | Status: AC
Start: 2018-01-11 — End: 2018-01-11
  Administered 2018-01-11: 60 mg via INTRAMUSCULAR
  Filled 2018-01-11: qty 2

## 2018-01-11 NOTE — Progress Notes (Signed)
Patient's Name: Sarah Donaldson  MRN: [id number]  Referring Provider: Cletis Athens, MD  DOB: 05/03/1951  PCP: Cletis Athens, MD  DOS: 01/11/2018  Note by: Vevelyn Francois NP  Service setting: Ambulatory outpatient  Specialty: Interventional Pain Management  Location: [facility] (AMB) Pain Management Facility    Patient type: Established    Primary Reason(s) for Visit: Encounter for prescription drug management. (Level of risk: moderate)  CC: Back Pain ( Low Back Radiating down Right hip and  Leg)  HPI  Sarah Donaldson is a 67 y.o. year old, adult patient, who comes today for a medication management evaluation. She has Anxiety; Back ache; Low back pain with sciatica; Colon polyp; Clinical depression; Difficulty hearing; H/O adenomatous polyp of colon; Hypercholesteremia; Adiposity; Idiopathic Parkinson's disease ([place]); REM sleep behavior disorder; Detached retina; Disordered sleep; Has a tremor; Hallux limitus; Lymphoma ([place]); Follicular lymphoma of extranodal and solid organ sites [facility]); Benign neoplasm of hand; Hearing loss; Numbness and tingling; Sleep difficulties; Chronic pain syndrome; Chronic pain of right lower extremity; Chronic myofascial pain; Obesity, unspecified; Sleep disorder; Backache; Bilateral low back pain with sciatica; Low back pain; Lumbar radiculopathy; Depression; Retinal detachment; Hx of adenomatous colonic polyps; Tremor; Parkinson's disease ([place]); Sleep behavior disorder, REM; and Chronic upper back pain on their problem list. Her primarily concern today is the Back Pain ( Low Back Radiating down Right hip and  Leg)  Pain Assessment: Location: Right, Lower Back Radiating: Radiating down Right hip to leg Onset: More than a month ago Duration: Chronic pain Quality: Squeezing, Aching, Cramping Severity: 10-Worst pain ever/10 (subjective, self-reported pain score)  Note: Reported level is compatible with observation. Clinically the patient looks like a 2/10 A 2/10 is viewed as "Mild to  Moderate" and described as noticeable and distracting. Impossible to hide from other people. More frequent flare-ups. Still possible to adapt and function close to normal. It can be very annoying and may have occasional stronger flare-ups. With discipline, patients may get used to it and adapt.       When using our objective Pain Scale, levels between 6 and 10/10 are said to belong in an emergency room, as it progressively worsens from a 6/10, described as severely limiting, requiring emergency care not usually available at an outpatient pain management facility. At a 6/10 level, communication becomes difficult and requires great effort. Assistance to reach the emergency department may be required. Facial flushing and profuse sweating along with potentially dangerous increases in heart rate and blood pressure will be evident. Effect on ADL: Difficulty walking Timing: Constant Modifying factors: meds, heat BP: 135/84  HR: 80  Sarah Donaldson was last scheduled for an appointment on 12/08/2017 for medication management. During today's appointment we reviewed Sarah Donaldson's chronic pain status, as well as her outpatient medication regimen. She admits that her back pain has gotten worse and she can not walk. She denies any recent falls. She admits that she has been outside in her garden raking and working.   The patient  reports that she does not use drugs. Her body mass index is 26.23 kg/m.  Further details on both, my assessment(s), as well as the proposed treatment plan, please see below.  Laboratory Chemistry  Inflammation Markers (CRP: Acute Phase) (ESR: Chronic Phase) Lab Results  Component Value Date   ESRSEDRATE 4 06/07/2013                         Rheumatology Markers No results found for: RF, ANA,  LABURIC, URICUR, LYMEIGGIGMAB, LYMEABIGMQN, HLAB27                      Renal Function Markers Lab Results  Component Value Date   BUN 31 (H) 11/19/2017   CREATININE 0.61 11/19/2017   GFRAA >60  11/19/2017   GFRNONAA >60 11/19/2017                              Hepatic Function Markers Lab Results  Component Value Date   AST 18 11/19/2017   ALT 8 (L) 11/19/2017   ALBUMIN 3.9 11/19/2017   ALKPHOS 105 11/19/2017                        Electrolytes Lab Results  Component Value Date   NA 140 11/19/2017   K 4.0 11/19/2017   CL 108 11/19/2017   CALCIUM 9.2 11/19/2017                        Neuropathy Markers No results found for: VITAMINB12, FOLATE, HGBA1C, HIV                      Bone Pathology Markers No results found for: VD25OH, VD125OH2TOT, ZO1096EA5, [id number], 25OHVITD1, 25OHVITD2, 25OHVITD3, TESTOFREE, TESTOSTERONE                       Coagulation Parameters Lab Results  Component Value Date   PLT 185 11/19/2017                        Cardiovascular Markers Lab Results  Component Value Date   HGB 14.5 11/19/2017   HCT 42.7 11/19/2017                         CA Markers No results found for: CEA, CA125, LABCA2                      Note: Lab results reviewed.  Recent Diagnostic Imaging Results  DG Lumbar Spine 2-3 Views CLINICAL DATA:  Chronic back pain with sciatica.  EXAM: LUMBAR SPINE - 2-3 VIEW  COMPARISON:  11/02/2017  FINDINGS: L1-2 posterior interbody fusion with intact hardware. Stable mild focal levoconvex curvature at L1-2. Lower lumbar degenerative disc and facet arthropathy of from L2 through S1 more marked at L2-3 and L5-S1. Associated multilevel lumbar facet arthropathy with joint space narrowing and sclerosis.  IMPRESSION: Stable levoconvex curvature of the upper lumbar spine with posterior lumbar fusion at L1-2. No acute osseous abnormality. Chronic degenerative disc disease as above.  Electronically Signed   By: Ashley Royalty M.D.   On: 12/08/2017 23:25 DG Thoracic Spine 2 View CLINICAL DATA:  Chronic upper back pain  EXAM: THORACIC SPINE 2 VIEWS  COMPARISON:  06/10/2017 report of the lumbar spine  FINDINGS: There  is no evidence of thoracic spine fracture. No suspicious osseous lesions of the thoracic spine. Multilevel small anterior osteophytes are seen along the thoracic spine. Alignment is normal. L1-2 lumbar spinal fusion hardware partially included. No other significant bone abnormalities are identified.  IMPRESSION: Mild degenerative endplate spurring and slight multilevel disc space narrowing of the thoracic spine without acute osseous abnormality. Redemonstration of L1-2 spinal fusion hardware without hardware failure.  Electronically Signed   By: Meredith Leeds.D.  On: 12/08/2017 23:11  Complexity Note: Imaging results reviewed. Results shared with Sarah Donaldson, using Layman's terms.                         Meds   Current Outpatient Medications:  .  Acetaminophen (TYLENOL ARTHRITIS PAIN PO), Take by mouth 2 (two) times daily., Disp: , Rfl:  .  carbidopa-levodopa (SINEMET IR) 25-100 MG per tablet, TAKE 2 tablets in a.m. and 1 tab BY MOUTH every 4 hrs, Disp: , Rfl: 0 .  clonazePAM (KLONOPIN) 1 MG tablet, Take 2 mg by mouth daily. , Disp: , Rfl:  .  furosemide (LASIX) 40 MG tablet, Take 20 mg by mouth as needed for fluid. Twice a week, Disp: , Rfl:  .  naproxen (EC NAPROSYN) 500 MG EC tablet, Take 1 tablet (500 mg total) by mouth 2 (two) times daily with a meal., Disp: 60 tablet, Rfl: 0 .  neomycin-polymyxin b-dexamethasone (MAXITROL) 3.5-10000-0.1 OINT, Place 1 application into the left eye at bedtime., Disp: , Rfl: 2 .  omeprazole (PRILOSEC) 20 MG capsule, Take 1 capsule (20 mg total) by mouth daily., Disp: 1 capsule, Rfl: 3 .  pramipexole (MIRAPEX) 1.5 MG tablet, Take 1.5 mg by mouth 3 (three) times daily., Disp: , Rfl: 5 .  prednisoLONE acetate (PRED FORTE) 1 % ophthalmic suspension, Place 1 drop into the left eye daily., Disp: , Rfl: 5 .  pregabalin (LYRICA) 25 MG capsule, Take 1 capsule (25 mg total) by mouth 4 (four) times daily., Disp: 120 capsule, Rfl: 2 .  promethazine (PHENERGAN)  12.5 MG tablet, Take 12.5 mg by mouth every 6 (six) hours as needed for nausea or vomiting., Disp: , Rfl:  .  ranitidine (ZANTAC) 150 MG capsule, Take 150 mg by mouth every evening., Disp: , Rfl:  .  selegiline (ELDEPRYL) 5 MG capsule, TAKE 1 CAPSULE BY MOUTH WITH BREAKFAST AND LUNCH., Disp: , Rfl: 1 .  traZODone (DESYREL) 50 MG tablet, TAKE 1-2 TABLETS NIGHTLY AS NEEDED FOR SLEEP., Disp: , Rfl: 1  ROS  Constitutional: Denies any fever or chills Gastrointestinal: No reported hemesis, hematochezia, vomiting, or acute GI distress Musculoskeletal: Denies any acute onset joint swelling, redness, loss of ROM, or weakness Neurological: No reported episodes of acute onset apraxia, aphasia, dysarthria, agnosia, amnesia, paralysis, loss of coordination, or loss of consciousness  Allergies  Sarah Donaldson is allergic to codeine sulfate and gabapentin.  [place]  Drug: Sarah Donaldson  reports that she does not use drugs. Alcohol:  reports that she drinks alcohol. Tobacco:  reports that she has quit smoking. She has never used smokeless tobacco. Medical:  has a past medical history of Cancer ([place]), Parkinson disease ([place]), and Parkinson disease ([place]). Surgical: Sarah Donaldson  has a past surgical history that includes Retinal detachment surgery; Bunionectomy; Nasal septum surgery; and Back surgery. Family: family history includes Heart disease in her mother.  Constitutional Exam  General appearance: Well nourished, well developed, and well hydrated. In no apparent acute distress Vitals:   01/11/18 1054  BP: 135/84  Pulse: 80  Resp: 18  Temp: 97.8 F (36.6 C)  TempSrc: Oral  SpO2: 96%  Weight: 165 lb (74.8 kg)  Psych/Mental status: Alert, oriented x 3 (person, place, & time)       Eyes: PERLA Respiratory: No evidence of acute respiratory distress  Lumbar Spine Area Exam  Skin & Axial Inspection: No masses, redness, or swelling Alignment: Symmetrical Functional ROM: Unrestricted ROM  Stability: No  instability detected Muscle Tone/Strength: Functionally intact. No obvious neuro-muscular anomalies detected. Sensory (Neurological): Unimpaired Palpation: Complains of area being tender to palpation       Provocative Tests: Lumbar Hyperextension/rotation test: deferred today       Lumbar quadrant test (Kemp's test): deferred today       Lumbar Lateral bending test: deferred today       Patrick's Maneuver: deferred today                   FABER test: deferred today       Thigh-thrust test: deferred today       S-I compression test: deferred today       S-I distraction test: deferred today        Gait & Posture Assessment  Ambulation: Unassisted No cane or wheelchair used Gait: Relatively normal for age and body habitus Posture: WNL   Lower Extremity Exam    Side: Right lower extremity  Side: Left lower extremity  Stability: No instability observed          Stability: No instability observed          Skin & Extremity Inspection: Skin color, temperature, and hair growth are WNL. No peripheral edema or cyanosis. No masses, redness, swelling, asymmetry, or associated skin lesions. No contractures.  Skin & Extremity Inspection: Skin color, temperature, and hair growth are WNL. No peripheral edema or cyanosis. No masses, redness, swelling, asymmetry, or associated skin lesions. No contractures.  Functional ROM: Unrestricted ROM                  Functional ROM: Unrestricted ROM                  Muscle Tone/Strength: Functionally intact. No obvious neuro-muscular anomalies detected.  Muscle Tone/Strength: Functionally intact. No obvious neuro-muscular anomalies detected.  Sensory (Neurological): Unimpaired  Sensory (Neurological): Unimpaired  Palpation: No palpable anomalies  Palpation: No palpable anomalies   Assessment  Primary Diagnosis & Pertinent Problem List: The primary encounter diagnosis was Lumbar radiculopathy. Diagnoses of Chronic pain of right lower extremity and Chronic pain  syndrome were also pertinent to this visit.  Status Diagnosis  Worsening Persistent Controlled 1. Lumbar radiculopathy   2. Chronic pain of right lower extremity   3. Chronic pain syndrome     Problems updated and reviewed during this visit: No problems updated. Plan of Care  Pharmacotherapy (Medications Ordered): Meds ordered this encounter  Medications  . orphenadrine (NORFLEX) injection 60 mg  . ketorolac (TORADOL) injection 60 mg   New Prescriptions   No medications on file   Medications administered today: We administered orphenadrine and ketorolac. Lab-work, procedure(s), and/or referral(s): Orders Placed This Encounter  Procedures  . Lumbar Epidural Injection   Imaging and/or referral(s): None  Interventional therapies: Planned, scheduled, and/or pending:   Right LESI with sedation  Provider-requested follow-up: No follow-ups on file.  Future Appointments  Date Time Provider [place]  01/24/2018 10:00 AM Gillis Santa, MD [facility] None  03/07/2018 11:00 AM Vevelyn Francois, NP [facility] None  05/18/2018  8:30 AM [facility]  05/19/2018 10:15 AM [facility] None  05/19/2018 10:45 AM Cammie Sickle, MD [facility] None   Primary Care Physician: Cletis Athens, MD Location: [facility] Note by: Vevelyn Francois NP Date: 01/11/2018; Time: 1:04 PM  Pain Score Disclaimer: We use the NRS-11 scale. This is a self-reported, subjective measurement of pain severity with only  modest accuracy. It is used primarily to identify changes within a particular patient. It must be understood that outpatient pain scales are significantly less accurate that those used for research, where they can be applied under ideal controlled circumstances with minimal exposure to variables. In reality, the score is likely to be a combination of pain intensity and pain affect, where pain affect describes the degree of emotional  arousal or changes in action readiness caused by the sensory experience of pain. Factors such as social and work situation, setting, emotional state, anxiety levels, expectation, and prior pain experience may influence pain perception and show large inter-individual differences that may also be affected by time variables.  Patient instructions provided during this appointment: Patient Instructions  ____________________________________________________________________________________________  Preparing for your procedure (without sedation)  Instructions: . Oral Intake: Do not eat or drink anything for at least 3 hours prior to your procedure. . Transportation: Unless otherwise stated by your physician, you may drive yourself after the procedure. . Blood Pressure Medicine: Take your blood pressure medicine with a sip of water the morning of the procedure. . Blood thinners:  . Diabetics on insulin: Notify the staff so that you can be scheduled 1st case in the morning. If your diabetes requires high dose insulin, take only  of your normal insulin dose the morning of the procedure and notify the staff that you have done so. . Preventing infections: Shower with an antibacterial soap the morning of your procedure.  . Build-up your immune system: Take 1000 mg of Vitamin C with every meal (3 times a day) the day prior to your procedure. [place] Antibiotics: Inform the staff if you have a condition or reason that requires you to take antibiotics before dental procedures. . Pregnancy: If you are pregnant, call and cancel the procedure. . Sickness: If you have a cold, fever, or any active infections, call and cancel the procedure. . Arrival: You must be in the facility at least 30 minutes prior to your scheduled procedure. . Children: Do not bring any children with you. . Dress appropriately: Bring dark clothing that you would not mind if they get stained. . Valuables: Do not bring any jewelry or  valuables.  Procedure appointments are reserved for interventional treatments only. [place] No Prescription Refills. . No medication changes will be discussed during procedure appointments. . No disability issues will be discussed.  Remember:  Regular Business hours are:  Monday to Thursday 8:00 AM to 4:00 PM  Provider's Schedule: Milinda Pointer, MD:  Procedure days: Tuesday and Thursday 7:30 AM to 4:00 PM  Gillis Santa, MD:  Procedure days: Monday and Wednesday 7:30 AM to 4:00 PM ____________________________________________________________________________________________  ____________________________________________________________________________________________  Preparing for Procedure with Sedation  Instructions: . Oral Intake: Do not eat or drink anything for at least 8 hours prior to your procedure. . Transportation: Public transportation is not allowed. Bring an adult driver. The driver must be physically present in our waiting room before any procedure can be started. [place] Physical Assistance: Bring an adult physically capable of assisting you, in the event you need help. This adult should keep you company at home for at least 6 hours after the procedure. . Blood Pressure Medicine: Take your blood pressure medicine with a sip of water the morning of the procedure. . Blood thinners:  . Diabetics on insulin: Notify the staff so that you can be scheduled 1st case in the morning. If your diabetes requires high dose insulin, take only  of your normal insulin dose  the morning of the procedure and notify the staff that you have done so. . Preventing infections: Shower with an antibacterial soap the morning of your procedure. . Build-up your immune system: Take 1000 mg of Vitamin C with every meal (3 times a day) the day prior to your procedure. [place] Antibiotics: Inform the staff if you have a condition or reason that requires you to take antibiotics before dental procedures. . Pregnancy: If  you are pregnant, call and cancel the procedure. . Sickness: If you have a cold, fever, or any active infections, call and cancel the procedure. . Arrival: You must be in the facility at least 30 minutes prior to your scheduled procedure. . Children: Do not bring children with you. . Dress appropriately: Bring dark clothing that you would not mind if they get stained. . Valuables: Do not bring any jewelry or valuables.  Procedure appointments are reserved for interventional treatments only. [place] No Prescription Refills. . No medication changes will be discussed during procedure appointments. . No disability issues will be discussed.  Remember:  Regular Business hours are:  Monday to Thursday 8:00 AM to 4:00 PM  Provider's Schedule: Milinda Pointer, MD:  Procedure days: Tuesday and Thursday 7:30 AM to 4:00 PM  Gillis Santa, MD:  Procedure days: Monday and Wednesday 7:30 AM to 4:00 PM ____________________________________________________________________________________________  Epidural Steroid Injection An epidural steroid injection is a shot of steroid medicine and numbing medicine that is given into the space between the spinal cord and the bones in your back (epidural space). The shot helps relieve pain caused by an irritated or swollen nerve root. The amount of pain relief you get from the injection depends on what is causing the nerve to be swollen and irritated, and how long your pain lasts. You are more likely to benefit from this injection if your pain is strong and comes on suddenly rather than if you have had pain for a long time. Tell a health care provider about:  Any allergies you have.  All medicines you are taking, including vitamins, herbs, eye drops, creams, and over-the-counter medicines.  Any problems you or family members have had with anesthetic medicines.  Any blood disorders you have.  Any surgeries you have had.  Any medical conditions you have.  Whether  you are pregnant or may be pregnant. What are the risks? Generally, this is a safe procedure. However, problems may occur, including:  Headache.  Bleeding.  Infection.  Allergic reaction to medicines.  Damage to your nerves.  What happens before the procedure? Staying hydrated Follow instructions from your health care provider about hydration, which may include:  Up to 2 hours before the procedure - you may continue to drink clear liquids, such as water, clear fruit juice, black coffee, and plain tea.  Eating and drinking restrictions Follow instructions from your health care provider about eating and drinking, which may include:  8 hours before the procedure - stop eating heavy meals or foods such as meat, fried foods, or fatty foods.  6 hours before the procedure - stop eating light meals or foods, such as toast or cereal.  6 hours before the procedure - stop drinking milk or drinks that contain milk.  2 hours before the procedure - stop drinking clear liquids.  Medicine  You may be given medicines to lower anxiety.  Ask your health care provider about: ? Changing or stopping your regular medicines. This is especially important if you are taking diabetes medicines or blood thinners. ?  Taking medicines such as aspirin and ibuprofen. These medicines can thin your blood. Do not take these medicines before your procedure if your health care provider instructs you not to. General instructions  Plan to have someone take you home from the hospital or clinic. What happens during the procedure?  You may receive a medicine to help you relax (sedative).  You will be asked to lie on your abdomen.  The injection site will be cleaned.  A numbing medicine (local anesthetic) will be used to numb the injection site.  A needle will be inserted through your skin into the epidural space. You may feel some discomfort when this happens. An X-ray machine will be used to make sure the  needle is put as close as possible to the affected nerve.  A steroid medicine and a local anesthetic will be injected into the epidural space.  The needle will be removed.  A bandage (dressing) will be put over the injection site. What happens after the procedure?  Your blood pressure, heart rate, breathing rate, and blood oxygen level will be monitored until the medicines you were given have worn off.  Your arm or leg may feel weak or numb for a few hours.  The injection site may feel sore.  Do not drive for 24 hours if you received a sedative. This information is not intended to replace advice given to you by your health care provider. Make sure you discuss any questions you have with your health care provider. Document Released: 11/03/2007 Document Revised: 01/08/2016 Document Reviewed: 11/12/2015 Elsevier Interactive Patient Education  [employer].

## 2018-01-11 NOTE — Progress Notes (Signed)
Safety precautions to be maintained throughout the outpatient stay will include: orient to surroundings, keep bed in low position, maintain call bell within reach at all times, provide assistance with transfer out of bed and ambulation.  

## 2018-01-11 NOTE — Patient Instructions (Signed)
____________________________________________________________________________________________  Preparing for your procedure (without sedation)  Instructions: . Oral Intake: Do not eat or drink anything for at least 3 hours prior to your procedure. . Transportation: Unless otherwise stated by your physician, you may drive yourself after the procedure. . Blood Pressure Medicine: Take your blood pressure medicine with a sip of water the morning of the procedure. . Blood thinners:  . Diabetics on insulin: Notify the staff so that you can be scheduled 1st case in the morning. If your diabetes requires high dose insulin, take only  of your normal insulin dose the morning of the procedure and notify the staff that you have done so. . Preventing infections: Shower with an antibacterial soap the morning of your procedure.  . Build-up your immune system: Take 1000 mg of Vitamin C with every meal (3 times a day) the day prior to your procedure. Marland Kitchen Antibiotics: Inform the staff if you have a condition or reason that requires you to take antibiotics before dental procedures. . Pregnancy: If you are pregnant, call and cancel the procedure. . Sickness: If you have a cold, fever, or any active infections, call and cancel the procedure. . Arrival: You must be in the facility at least 30 minutes prior to your scheduled procedure. . Children: Do not bring any children with you. . Dress appropriately: Bring dark clothing that you would not mind if they get stained. . Valuables: Do not bring any jewelry or valuables.  Procedure appointments are reserved for interventional treatments only. Marland Kitchen No Prescription Refills. . No medication changes will be discussed during procedure appointments. . No disability issues will be discussed.  Remember:  Regular Business hours are:  Monday to Thursday 8:00 AM to 4:00 PM  Provider's Schedule: Milinda Pointer, MD:  Procedure days: Tuesday and Thursday 7:30 AM to 4:00  PM  Gillis Santa, MD:  Procedure days: Monday and Wednesday 7:30 AM to 4:00 PM ____________________________________________________________________________________________  ____________________________________________________________________________________________  Preparing for Procedure with Sedation  Instructions: . Oral Intake: Do not eat or drink anything for at least 8 hours prior to your procedure. . Transportation: Public transportation is not allowed. Bring an adult driver. The driver must be physically present in our waiting room before any procedure can be started. Marland Kitchen Physical Assistance: Bring an adult physically capable of assisting you, in the event you need help. This adult should keep you company at home for at least 6 hours after the procedure. . Blood Pressure Medicine: Take your blood pressure medicine with a sip of water the morning of the procedure. . Blood thinners:  . Diabetics on insulin: Notify the staff so that you can be scheduled 1st case in the morning. If your diabetes requires high dose insulin, take only  of your normal insulin dose the morning of the procedure and notify the staff that you have done so. . Preventing infections: Shower with an antibacterial soap the morning of your procedure. . Build-up your immune system: Take 1000 mg of Vitamin C with every meal (3 times a day) the day prior to your procedure. Marland Kitchen Antibiotics: Inform the staff if you have a condition or reason that requires you to take antibiotics before dental procedures. . Pregnancy: If you are pregnant, call and cancel the procedure. . Sickness: If you have a cold, fever, or any active infections, call and cancel the procedure. . Arrival: You must be in the facility at least 30 minutes prior to your scheduled procedure. . Children: Do not bring children with you. Marland Kitchen  Dress appropriately: Bring dark clothing that you would not mind if they get stained. . Valuables: Do not bring any jewelry or  valuables.  Procedure appointments are reserved for interventional treatments only. Marland Kitchen No Prescription Refills. . No medication changes will be discussed during procedure appointments. . No disability issues will be discussed.  Remember:  Regular Business hours are:  Monday to Thursday 8:00 AM to 4:00 PM  Provider's Schedule: Milinda Pointer, MD:  Procedure days: Tuesday and Thursday 7:30 AM to 4:00 PM  Gillis Santa, MD:  Procedure days: Monday and Wednesday 7:30 AM to 4:00 PM ____________________________________________________________________________________________  Epidural Steroid Injection An epidural steroid injection is a shot of steroid medicine and numbing medicine that is given into the space between the spinal cord and the bones in your back (epidural space). The shot helps relieve pain caused by an irritated or swollen nerve root. The amount of pain relief you get from the injection depends on what is causing the nerve to be swollen and irritated, and how long your pain lasts. You are more likely to benefit from this injection if your pain is strong and comes on suddenly rather than if you have had pain for a long time. Tell a health care provider about:  Any allergies you have.  All medicines you are taking, including vitamins, herbs, eye drops, creams, and over-the-counter medicines.  Any problems you or family members have had with anesthetic medicines.  Any blood disorders you have.  Any surgeries you have had.  Any medical conditions you have.  Whether you are pregnant or may be pregnant. What are the risks? Generally, this is a safe procedure. However, problems may occur, including:  Headache.  Bleeding.  Infection.  Allergic reaction to medicines.  Damage to your nerves.  What happens before the procedure? Staying hydrated Follow instructions from your health care provider about hydration, which may include:  Up to 2 hours before the  procedure - you may continue to drink clear liquids, such as water, clear fruit juice, black coffee, and plain tea.  Eating and drinking restrictions Follow instructions from your health care provider about eating and drinking, which may include:  8 hours before the procedure - stop eating heavy meals or foods such as meat, fried foods, or fatty foods.  6 hours before the procedure - stop eating light meals or foods, such as toast or cereal.  6 hours before the procedure - stop drinking milk or drinks that contain milk.  2 hours before the procedure - stop drinking clear liquids.  Medicine  You may be given medicines to lower anxiety.  Ask your health care provider about: ? Changing or stopping your regular medicines. This is especially important if you are taking diabetes medicines or blood thinners. ? Taking medicines such as aspirin and ibuprofen. These medicines can thin your blood. Do not take these medicines before your procedure if your health care provider instructs you not to. General instructions  Plan to have someone take you home from the hospital or clinic. What happens during the procedure?  You may receive a medicine to help you relax (sedative).  You will be asked to lie on your abdomen.  The injection site will be cleaned.  A numbing medicine (local anesthetic) will be used to numb the injection site.  A needle will be inserted through your skin into the epidural space. You may feel some discomfort when this happens. An X-ray machine will be used to make sure the needle is  put as close as possible to the affected nerve.  A steroid medicine and a local anesthetic will be injected into the epidural space.  The needle will be removed.  A bandage (dressing) will be put over the injection site. What happens after the procedure?  Your blood pressure, heart rate, breathing rate, and blood oxygen level will be monitored until the medicines you were given have worn  off.  Your arm or leg may feel weak or numb for a few hours.  The injection site may feel sore.  Do not drive for 24 hours if you received a sedative. This information is not intended to replace advice given to you by your health care provider. Make sure you discuss any questions you have with your health care provider. Document Released: 11/03/2007 Document Revised: 01/08/2016 Document Reviewed: 11/12/2015 Elsevier Interactive Patient Education  [employer].

## 2018-01-24 ENCOUNTER — Ambulatory Visit
Admission: RE | Admit: 2018-01-24 | Discharge: 2018-01-24 | Disposition: A | Discharge status: Home / Self Care | Payer: Medicare Other | Source: Ambulatory Visit | Attending: Student in an Organized Health Care Education/Training Program | Admitting: Student in an Organized Health Care Education/Training Program

## 2018-01-24 ENCOUNTER — Encounter: Payer: Self-pay | Admitting: Student in an Organized Health Care Education/Training Program

## 2018-01-24 ENCOUNTER — Ambulatory Visit (HOSPITAL_BASED_OUTPATIENT_CLINIC_OR_DEPARTMENT_OTHER): Payer: Medicare Other | Admitting: Student in an Organized Health Care Education/Training Program

## 2018-01-24 VITALS — BP 126/80 | HR 74 | Temp 98.0°F | Resp 20 | Ht 66.5 in | Wt 165.0 lb

## 2018-01-24 DIAGNOSIS — Z79899 Other long term (current) drug therapy: Secondary | ICD-10-CM | POA: Diagnosis not present

## 2018-01-24 DIAGNOSIS — R1310 Dysphagia, unspecified: Secondary | ICD-10-CM | POA: Diagnosis not present

## 2018-01-24 DIAGNOSIS — Z8572 Personal history of non-Hodgkin lymphomas: Secondary | ICD-10-CM | POA: Diagnosis not present

## 2018-01-24 DIAGNOSIS — M5416 Radiculopathy, lumbar region: Secondary | ICD-10-CM | POA: Insufficient documentation

## 2018-01-24 DIAGNOSIS — E041 Nontoxic single thyroid nodule: Secondary | ICD-10-CM | POA: Diagnosis not present

## 2018-01-24 MED ORDER — IOPAMIDOL (ISOVUE-M 200) INJECTION 41%
10.0000 mL | Freq: Once | INTRAMUSCULAR | Status: AC
  Administered 2018-01-24: 10 mL via EPIDURAL

## 2018-01-24 MED ORDER — ROPIVACAINE HCL 2 MG/ML IJ SOLN
INTRAMUSCULAR | Status: AC
  Filled 2018-01-24: qty 10

## 2018-01-24 MED ORDER — IOPAMIDOL (ISOVUE-M 200) INJECTION 41%
INTRAMUSCULAR | Status: AC
  Filled 2018-01-24: qty 10

## 2018-01-24 MED ORDER — ROPIVACAINE HCL 2 MG/ML IJ SOLN
2.0000 mL | Freq: Once | INTRAMUSCULAR | Status: AC
  Administered 2018-01-24: 2 mL via EPIDURAL

## 2018-01-24 MED ORDER — DEXAMETHASONE SODIUM PHOSPHATE 10 MG/ML IJ SOLN
INTRAMUSCULAR | Status: AC
  Filled 2018-01-24: qty 1

## 2018-01-24 MED ORDER — SODIUM CHLORIDE 0.9% FLUSH
2.0000 mL | Freq: Once | INTRAVENOUS | Status: AC
  Administered 2018-01-24: 2 mL

## 2018-01-24 MED ORDER — SODIUM CHLORIDE 0.9 % IJ SOLN
INTRAMUSCULAR | Status: AC
  Filled 2018-01-24: qty 10

## 2018-01-24 MED ORDER — LIDOCAINE HCL (PF) 1 % IJ SOLN
INTRAMUSCULAR | Status: AC
  Filled 2018-01-24: qty 10

## 2018-01-24 MED ORDER — LIDOCAINE HCL (PF) 1 % IJ SOLN
4.5000 mL | Freq: Once | INTRAMUSCULAR | Status: AC
  Administered 2018-01-24: 5 mL

## 2018-01-24 MED ORDER — DEXAMETHASONE SODIUM PHOSPHATE 10 MG/ML IJ SOLN
10.0000 mg | Freq: Once | INTRAMUSCULAR | Status: AC
  Administered 2018-01-24: 10 mg

## 2018-01-24 NOTE — Progress Notes (Signed)
Patient's Name: Sarah Donaldson  MRN: [id number]  Referring Provider: Cletis Athens, MD  DOB: 1951/01/07  PCP: Cletis Athens, MD  DOS: 01/24/2018  Note by: Gillis Santa, MD  Service setting: Ambulatory outpatient  Specialty: Interventional Pain Management  Patient type: Established  Location: [facility] (AMB) Pain Management Facility  Visit type: Interventional Procedure   Primary Reason for Visit: Interventional Pain Management Treatment. CC: Back Pain (lower, right is worse )  Procedure:       Anesthesia, Analgesia, Anxiolysis:  Type: Therapeutic Inter-Laminar Epidural Steroid Injection #3  Region: Lumbar Level: L3-4 Level. Laterality: Right-Sided         Type: Local Anesthesia Indication(s): Analgesia         Route: Infiltration (/IM) IV Access: Declined Sedation: Declined  Local Anesthetic: Lidocaine 1%   Indications: 1. Lumbar radiculopathy    Pain Score: Pre-procedure: 10-Worst pain ever/10 Post-procedure: 8 /10  Pre-op Assessment:  Sarah Donaldson is a 67 y.o. (year old), adult patient, seen today for interventional treatment. She  has a past surgical history that includes Retinal detachment surgery; Bunionectomy; Nasal septum surgery; and Back surgery. Sarah Donaldson has a current medication list which includes the following prescription(s): acetaminophen, carbidopa-levodopa, clindamycin, clonazepam, furosemide, naproxen, neomycin-polymyxin b-dexamethasone, omeprazole, pramipexole, prednisolone acetate, pregabalin, promethazine, ranitidine, selegiline, and trazodone. Her primarily concern today is the Back Pain (lower, right is worse )  Initial Vital Signs:  Pulse/HCG Rate: 75  Temp: 98 F (36.7 C) Resp: 16 BP: 125/73 SpO2: 96 %  BMI: Estimated body mass index is 26.23 kg/m as calculated from the following:   Height as of this encounter: 5' 6.5" (1.689 m).   Weight as of this encounter: 165 lb (74.8 kg).  Risk Assessment: Allergies: Reviewed. She is allergic to codeine sulfate  and gabapentin.  Allergy Precautions: None required Coagulopathies: Reviewed. None identified.  Blood-thinner therapy: None at this time Active Infection(s): Reviewed. None identified. Sarah Donaldson is afebrile  Site Confirmation: Sarah Donaldson was asked to confirm the procedure and laterality before marking the site Procedure checklist: Completed Consent: Before the procedure and under the influence of no sedative(s), amnesic(s), or anxiolytics, the patient was informed of the treatment options, risks and possible complications. To fulfill our ethical and legal obligations, as recommended by the American Medical Association's Code of Ethics, I have informed the patient of my clinical impression; the nature and purpose of the treatment or procedure; the risks, benefits, and possible complications of the intervention; the alternatives, including doing nothing; the risk(s) and benefit(s) of the alternative treatment(s) or procedure(s); and the risk(s) and benefit(s) of doing nothing. The patient was provided information about the general risks and possible complications associated with the procedure. These may include, but are not limited to: failure to achieve desired goals, infection, bleeding, organ or nerve damage, allergic reactions, paralysis, and death. In addition, the patient was informed of those risks and complications associated to Spine-related procedures, such as failure to decrease pain; infection (i.e.: Meningitis, epidural or intraspinal abscess); bleeding (i.e.: epidural hematoma, subarachnoid hemorrhage, or any other type of intraspinal or peri-dural bleeding); organ or nerve damage (i.e.: Any type of peripheral nerve, nerve root, or spinal cord injury) with subsequent damage to sensory, motor, and/or autonomic systems, resulting in permanent pain, numbness, and/or weakness of one or several areas of the body; allergic reactions; (i.e.: anaphylactic reaction); and/or death. Furthermore, the  patient was informed of those risks and complications associated with the medications. These include, but are not limited to: allergic reactions (i.e.: anaphylactic  or anaphylactoid reaction(s)); adrenal axis suppression; blood sugar elevation that in diabetics may result in ketoacidosis or comma; water retention that in patients with history of congestive heart failure may result in shortness of breath, pulmonary edema, and decompensation with resultant heart failure; weight gain; swelling or edema; medication-induced neural toxicity; particulate matter embolism and blood vessel occlusion with resultant organ, and/or nervous system infarction; and/or aseptic necrosis of one or more joints. Finally, the patient was informed that Medicine is not an exact science; therefore, there is also the possibility of unforeseen or unpredictable risks and/or possible complications that may result in a catastrophic outcome. The patient indicated having understood very clearly. We have given the patient no guarantees and we have made no promises. Enough time was given to the patient to ask questions, all of which were answered to the patient's satisfaction. Sarah Donaldson has indicated that she wanted to continue with the procedure. Attestation: I, the ordering provider, attest that I have discussed with the patient the benefits, risks, side-effects, alternatives, likelihood of achieving goals, and potential problems during recovery for the procedure that I have provided informed consent. Date  Time: 01/24/2018 10:05 AM  Pre-Procedure Preparation:  Monitoring: As per clinic protocol. Respiration, ETCO2, SpO2, BP, heart rate and rhythm monitor placed and checked for adequate function Safety Precautions: Patient was assessed for positional comfort and pressure points before starting the procedure. Time-out: I initiated and conducted the "Time-out" before starting the procedure, as per protocol. The patient was asked to  participate by confirming the accuracy of the "Time Out" information. Verification of the correct person, site, and procedure were performed and confirmed by me, the nursing staff, and the patient. "Time-out" conducted as per Joint Commission's Universal Protocol (UP.01.01.01). Time: 1058  Description of Procedure:       Position: Prone with head of the table was raised to facilitate breathing. Target Area: The interlaminar space, initially targeting the lower laminar border of the superior vertebral body. Approach: Paramedial approach. Area Prepped: Entire Posterior Lumbar Region Prepping solution: ChloraPrep (2% chlorhexidine gluconate and 70% isopropyl alcohol) Safety Precautions: Aspiration looking for blood return was conducted prior to all injections. At no point did we inject any substances, as a needle was being advanced. No attempts were made at seeking any paresthesias. Safe injection practices and needle disposal techniques used. Medications properly checked for expiration dates. SDV (single dose vial) medications used. Description of the Procedure: Protocol guidelines were followed. The procedure needle was introduced through the skin, ipsilateral to the reported pain, and advanced to the target area. Bone was contacted and the needle walked caudad, until the lamina was cleared. The epidural space was identified using "loss-of-resistance technique" with 2-3 ml of PF-NaCl (0.9% NSS), in a 5cc LOR glass syringe. Vitals:   01/24/18 1059 01/24/18 1104 01/24/18 1109 01/24/18 1115  BP: 127/82 111/69 127/83 126/80  Pulse: 71 79 75 74  Resp: 18 16 (!) 21 20  Temp:      TempSrc:      SpO2: 96% 96% 97% 98%  Weight:      Height:        Start Time: 1058 hrs. End Time: 1110 hrs. Materials:  Needle(s) Type: Epidural needle Gauge: 17G Length: 3.5-in Medication(s): Please see orders for medications and dosing details. 9 CC solution made of 6 cc of preservative-free saline, 2 cc of 0.2%  ropivacaine, 1 cc of Decadron 10 mg/cc Imaging Guidance (Spinal):  Type of Imaging Technique: Fluoroscopy Guidance (Spinal) Indication(s): Assistance in needle guidance  and placement for procedures requiring needle placement in or near specific anatomical locations not easily accessible without such assistance. Exposure Time: Please see nurses notes. Contrast: Before injecting any contrast, we confirmed that the patient did not have an allergy to iodine, shellfish, or radiological contrast. Once satisfactory needle placement was completed at the desired level, radiological contrast was injected. Contrast injected under live fluoroscopy. No contrast complications. See chart for type and volume of contrast used. Fluoroscopic Guidance: I was personally present during the use of fluoroscopy. "Tunnel Vision Technique" used to obtain the best possible view of the target area. Parallax error corrected before commencing the procedure. "Direction-depth-direction" technique used to introduce the needle under continuous pulsed fluoroscopy. Once target was reached, antero-posterior, oblique, and lateral fluoroscopic projection used confirm needle placement in all planes. Images permanently stored in EMR. Interpretation: I personally interpreted the imaging intraoperatively. Adequate needle placement confirmed in multiple planes. Appropriate spread of contrast into desired area was observed. No evidence of afferent or efferent intravascular uptake. No intrathecal or subarachnoid spread observed. Permanent images saved into the patient's record.  Antibiotic Prophylaxis:   Anti-infectives (From admission, onward)   None     Indication(s): None identified  Post-operative Assessment:  Post-procedure Vital Signs:  Pulse/HCG Rate: 74  Temp: 98 F (36.7 C) Resp: 20 BP: 126/80 SpO2: 98 %  EBL: None  Complications: No immediate post-treatment complications observed by team, or reported by patient.  Note: The  patient tolerated the entire procedure well. A repeat set of vitals were taken after the procedure and the patient was kept under observation following institutional policy, for this type of procedure. Post-procedural neurological assessment was performed, showing return to baseline, prior to discharge. The patient was provided with post-procedure discharge instructions, including a section on how to identify potential problems. Should any problems arise concerning this procedure, the patient was given instructions to immediately contact us, at any time, without hesitation. In any case, we plan to contact the patient by telephone for a follow-up status report regarding this interventional procedure.  Comments:  No additional relevant information. 5 out of 5 strength bilateral lower extremity: Plantar flexion, dorsiflexion, knee flexion, knee extension.  Plan of Care    Imaging Orders     DG C-Arm 1-60 Min-No Report Procedure Orders    No procedure(s) ordered today    Medications ordered for procedure: Meds ordered this encounter  Medications  . iopamidol (ISOVUE-M) 41 % intrathecal injection 10 mL  . ropivacaine (PF) 2 mg/mL (0.2%) (NAROPIN) injection 2 mL  . sodium chloride flush (NS) 0.9 % injection 2 mL  . dexamethasone (DECADRON) injection 10 mg  . lidocaine (PF) (XYLOCAINE) 1 % injection 4.5 mL   Medications administered: We administered iopamidol, ropivacaine (PF) 2 mg/mL (0.2%), sodium chloride flush, dexamethasone, and lidocaine (PF).  See the medical record for exact dosing, route, and time of administration.  New Prescriptions   No medications on file   Disposition: Discharge home  Discharge Date & Time: 01/24/2018; 1118 hrs.   Physician-requested Follow-up: Keep appointment with Dionisio David for 03/07/2018  Future Appointments  Date Time Provider [place]  03/07/2018 11:00 AM Vevelyn Francois, NP [facility] None  05/18/2018  8:30 AM [facility]   05/19/2018 10:15 AM [facility] None  05/19/2018 10:45 AM Cammie Sickle, MD [facility] None   Primary Care Physician: Cletis Athens, MD Location: [facility] Out[facility] Facility Note by: Gillis Santa, MD Date: 01/24/2018; Time: 3:09 PM  Disclaimer:  Medicine is not an Chief Strategy Officer. The only guarantee in medicine is that nothing is guaranteed. It is important to note that the decision to proceed with this intervention was based on the information collected from the patient. The Data and conclusions were drawn from the patient's questionnaire, the interview, and the physical examination. Because the information was provided in large part by the patient, it cannot be guaranteed that it has not been purposely or unconsciously manipulated. Every effort has been made to obtain as much relevant data as possible for this evaluation. It is important to note that the conclusions that lead to this procedure are derived in large part from the available data. Always take into account that the treatment will also be dependent on availability of resources and existing treatment guidelines, considered by other Pain Management Practitioners as being common knowledge and practice, at the time of the intervention. For Medico-Legal purposes, it is also important to point out that variation in procedural techniques and pharmacological choices are the acceptable norm. The indications, contraindications, technique, and results of the above procedure should only be interpreted and judged by a Board-Certified Interventional Pain Specialist with extensive familiarity and expertise in the same exact procedure and technique.

## 2018-01-24 NOTE — Progress Notes (Signed)
Safety precautions to be maintained throughout the outpatient stay will include: orient to surroundings, keep bed in low position, maintain call bell within reach at all times, provide assistance with transfer out of bed and ambulation.  

## 2018-01-24 NOTE — Patient Instructions (Signed)

## 2018-01-25 ENCOUNTER — Telehealth: Payer: Self-pay

## 2018-01-25 NOTE — Telephone Encounter (Signed)
States she is better today and is walking better today,. Pt using heat today. Instrusted to call if needed.

## 2018-01-31 ENCOUNTER — Telehealth: Payer: Self-pay

## 2018-01-31 NOTE — Telephone Encounter (Signed)
Spoke with patient re; leg pain.  She had an LESI on 01/24/18 and states that it has done nothing for her this time.  States she is having a lot of pain in legs.  I did tell her that it has not been the correct time interval for evaluating the benefits of the procedure.    Also the patient asked about LYrica 25 mg qid and wants to increase her dosing.  I explained that I could not tell her to increase her medication without speaking with Dr Holley Raring or Dionisio David NP and they are both currently out of the office.  Patient is going on beach trip on July 2 and will return on July 8 and will call if she feels she needs evaluation at that time.

## 2018-01-31 NOTE — Telephone Encounter (Signed)
Patients husband called and would like a phone call regarding the procedure the patient had on 01/24/18. He specifically asked for Crystal to call him back but she's not here this week so maybe one of the nurses can answer his questions

## 2018-01-31 NOTE — Telephone Encounter (Signed)
Attempted to call, message left. 

## 2018-02-07 DIAGNOSIS — M25862 Other specified joint disorders, left knee: Secondary | ICD-10-CM

## 2018-02-07 HISTORY — DX: Other specified joint disorders, left knee: M25.862

## 2018-02-14 ENCOUNTER — Telehealth: Payer: Self-pay | Admitting: Student in an Organized Health Care Education/Training Program

## 2018-02-14 NOTE — Telephone Encounter (Signed)
No relief with 01/24/2018 LESI with no relief. In pain and can not walk esp in the mornings. Is there anyhthing else that can be done?  Has Med refill appointment with Crystal 07-29. Please advise.

## 2018-02-14 NOTE — Telephone Encounter (Signed)
Wife is in a lot of pain and can hardly walk. Please call asap.

## 2018-02-15 NOTE — Telephone Encounter (Signed)
Patient called and notified of Dr. Elwyn Lade advise.

## 2018-02-16 DIAGNOSIS — M5441 Lumbago with sciatica, right side: Secondary | ICD-10-CM | POA: Diagnosis not present

## 2018-02-16 DIAGNOSIS — G4752 REM sleep behavior disorder: Secondary | ICD-10-CM | POA: Diagnosis not present

## 2018-02-16 DIAGNOSIS — G20 Parkinson's disease: Secondary | ICD-10-CM | POA: Diagnosis not present

## 2018-02-16 DIAGNOSIS — G8929 Other chronic pain: Secondary | ICD-10-CM | POA: Diagnosis not present

## 2018-02-16 DIAGNOSIS — R202 Paresthesia of skin: Secondary | ICD-10-CM | POA: Diagnosis not present

## 2018-02-16 DIAGNOSIS — G479 Sleep disorder, unspecified: Secondary | ICD-10-CM | POA: Diagnosis not present

## 2018-02-16 DIAGNOSIS — R200 Anesthesia of skin: Secondary | ICD-10-CM | POA: Diagnosis not present

## 2018-02-22 DIAGNOSIS — K219 Gastro-esophageal reflux disease without esophagitis: Secondary | ICD-10-CM | POA: Diagnosis not present

## 2018-02-22 DIAGNOSIS — M7051 Other bursitis of knee, right knee: Secondary | ICD-10-CM | POA: Diagnosis not present

## 2018-02-22 DIAGNOSIS — M792 Neuralgia and neuritis, unspecified: Secondary | ICD-10-CM | POA: Diagnosis not present

## 2018-02-22 DIAGNOSIS — M1712 Unilateral primary osteoarthritis, left knee: Secondary | ICD-10-CM | POA: Diagnosis not present

## 2018-02-22 DIAGNOSIS — C8190 Hodgkin lymphoma, unspecified, unspecified site: Secondary | ICD-10-CM | POA: Diagnosis not present

## 2018-03-07 ENCOUNTER — Ambulatory Visit
Admission: RE | Admit: 2018-03-07 | Discharge: 2018-03-07 | Disposition: A | Discharge status: Home / Self Care | Payer: Medicare Other | Source: Ambulatory Visit | Attending: Nurse Practitioner | Admitting: Nurse Practitioner

## 2018-03-07 ENCOUNTER — Ambulatory Visit: Payer: Medicare Other | Attending: Nurse Practitioner | Admitting: Nurse Practitioner

## 2018-03-07 ENCOUNTER — Encounter: Payer: Self-pay | Admitting: Nurse Practitioner

## 2018-03-07 ENCOUNTER — Other Ambulatory Visit: Payer: Self-pay

## 2018-03-07 VITALS — BP 137/82 | HR 97 | Temp 98.0°F | Ht 66.0 in | Wt 166.0 lb

## 2018-03-07 DIAGNOSIS — Z87891 Personal history of nicotine dependence: Secondary | ICD-10-CM | POA: Diagnosis not present

## 2018-03-07 DIAGNOSIS — M5416 Radiculopathy, lumbar region: Secondary | ICD-10-CM | POA: Diagnosis not present

## 2018-03-07 DIAGNOSIS — M79604 Pain in right leg: Secondary | ICD-10-CM | POA: Diagnosis not present

## 2018-03-07 DIAGNOSIS — F329 Major depressive disorder, single episode, unspecified: Secondary | ICD-10-CM | POA: Insufficient documentation

## 2018-03-07 DIAGNOSIS — M4726 Other spondylosis with radiculopathy, lumbar region: Secondary | ICD-10-CM | POA: Insufficient documentation

## 2018-03-07 DIAGNOSIS — G4752 REM sleep behavior disorder: Secondary | ICD-10-CM | POA: Insufficient documentation

## 2018-03-07 DIAGNOSIS — H9190 Unspecified hearing loss, unspecified ear: Secondary | ICD-10-CM | POA: Insufficient documentation

## 2018-03-07 DIAGNOSIS — G8929 Other chronic pain: Secondary | ICD-10-CM

## 2018-03-07 DIAGNOSIS — M47816 Spondylosis without myelopathy or radiculopathy, lumbar region: Secondary | ICD-10-CM

## 2018-03-07 DIAGNOSIS — M25562 Pain in left knee: Secondary | ICD-10-CM | POA: Diagnosis not present

## 2018-03-07 DIAGNOSIS — Z79899 Other long term (current) drug therapy: Secondary | ICD-10-CM | POA: Insufficient documentation

## 2018-03-07 DIAGNOSIS — Z981 Arthrodesis status: Secondary | ICD-10-CM | POA: Insufficient documentation

## 2018-03-07 DIAGNOSIS — M7918 Myalgia, other site: Secondary | ICD-10-CM | POA: Diagnosis not present

## 2018-03-07 DIAGNOSIS — Z888 Allergy status to other drugs, medicaments and biological substances status: Secondary | ICD-10-CM | POA: Diagnosis not present

## 2018-03-07 DIAGNOSIS — G894 Chronic pain syndrome: Secondary | ICD-10-CM | POA: Insufficient documentation

## 2018-03-07 DIAGNOSIS — E7800 Pure hypercholesterolemia, unspecified: Secondary | ICD-10-CM | POA: Insufficient documentation

## 2018-03-07 DIAGNOSIS — Z9181 History of falling: Secondary | ICD-10-CM | POA: Diagnosis not present

## 2018-03-07 DIAGNOSIS — Z86010 Personal history of colonic polyps: Secondary | ICD-10-CM | POA: Diagnosis not present

## 2018-03-07 DIAGNOSIS — Z8249 Family history of ischemic heart disease and other diseases of the circulatory system: Secondary | ICD-10-CM | POA: Diagnosis not present

## 2018-03-07 DIAGNOSIS — Z885 Allergy status to narcotic agent status: Secondary | ICD-10-CM | POA: Diagnosis not present

## 2018-03-07 DIAGNOSIS — M545 Low back pain: Secondary | ICD-10-CM | POA: Diagnosis not present

## 2018-03-07 DIAGNOSIS — M4186 Other forms of scoliosis, lumbar region: Secondary | ICD-10-CM | POA: Diagnosis not present

## 2018-03-07 DIAGNOSIS — G20 Parkinson's disease: Secondary | ICD-10-CM | POA: Diagnosis not present

## 2018-03-07 DIAGNOSIS — R296 Repeated falls: Secondary | ICD-10-CM | POA: Diagnosis not present

## 2018-03-07 DIAGNOSIS — C8299 Follicular lymphoma, unspecified, extranodal and solid organ sites: Secondary | ICD-10-CM | POA: Insufficient documentation

## 2018-03-07 DIAGNOSIS — F419 Anxiety disorder, unspecified: Secondary | ICD-10-CM | POA: Diagnosis not present

## 2018-03-07 DIAGNOSIS — S3992XA Unspecified injury of lower back, initial encounter: Secondary | ICD-10-CM | POA: Diagnosis not present

## 2018-03-07 MED ORDER — PREGABALIN 25 MG PO CAPS: 25.0000 mg | ORAL_CAPSULE | Freq: Four times a day (QID) | ORAL | 2 refills | Status: DC

## 2018-03-07 MED ORDER — OMEPRAZOLE 20 MG PO CPDR: 20.0000 mg | DELAYED_RELEASE_CAPSULE | Freq: Every day | ORAL | 0 refills | Status: DC

## 2018-03-07 NOTE — Progress Notes (Signed)
Patient's Name: Sarah Donaldson  MRN: [id number]  Referring Provider: Cletis Athens, MD  DOB: [birth year]  PCP: Cletis Athens, MD  DOS: 03/07/2018  Note by: Vevelyn Francois NP  Service setting: Ambulatory outpatient  Specialty: Interventional Pain Management  Location: [facility] (AMB) Pain Management Facility    Patient type: Established    Primary Reason(s) for Visit: Encounter for prescription drug management & post-procedure evaluation of chronic illness with mild to moderate exacerbation(Level of risk: moderate) CC: buttock  HPI  Sarah Donaldson is a 67 y.o. year old, adult patient, who comes today for a post-procedure evaluation and medication management. She has Anxiety; Back ache; Low back pain with sciatica; Colon polyp; Clinical depression; Difficulty hearing; H/O adenomatous polyp of colon; Hypercholesteremia; Adiposity; Idiopathic Parkinson's disease ([place]); REM sleep behavior disorder; Detached retina; Disordered sleep; Has a tremor; Hallux limitus; Lymphoma ([place]); Follicular lymphoma of extranodal and solid organ sites [facility]); Benign neoplasm of hand; Hearing loss; Numbness and tingling; Sleep difficulties; Chronic pain syndrome; Chronic pain of right lower extremity; Chronic myofascial pain; Obesity, unspecified; Sleep disorder; Backache; Bilateral low back pain with sciatica; Low back pain; Lumbar radiculopathy; Depression; Retinal detachment; Hx of adenomatous colonic polyps; Tremor; Parkinson's disease ([place]); Sleep behavior disorder, REM; Chronic upper back pain; Lumbar spondylosis; and Chronic pain of left knee on their problem list. Her primarily concern today is the buttock  Pain Assessment: Location: Right Buttocks Radiating: pain radiates down right buttocks to right hip to right knee, left knee Onset: More than a month ago Duration: Chronic pain Quality: Throbbing, Constant, Nagging, Discomfort Severity: 9 /10 (subjective, self-reported pain score)  Note: Reported level is compatible with  observation. Clinically the patient looks like a 2/10 A 2/10 is viewed as "Mild to Moderate" and described as noticeable and distracting. Impossible to hide from other people. More frequent flare-ups. Still possible to adapt and function close to normal. It can be very annoying and may have occasional stronger flare-ups. With discipline, patients may get used to it and adapt. Information on the proper use of the pain scale provided to the patient today. When using our objective Pain Scale, levels between 6 and 10/10 are said to belong in an emergency room, as it progressively worsens from a 6/10, described as severely limiting, requiring emergency care not usually available at an outpatient pain management facility. At a 6/10 level, communication becomes difficult and requires great effort. Assistance to reach the emergency department may be required. Facial flushing and profuse sweating along with potentially dangerous increases in heart rate and blood pressure will be evident. Effect on ADL: limits daily activities Timing: Constant Modifying factors: medications, ice and heat, laying down BP: 137/82  HR: 97  Sarah Donaldson was last seen on 02/14/2018 for a procedure. During today's appointment we reviewed Sarah Donaldson's post-procedure results, as well as her outpatient medication regimen.  Further details on both, my assessment(s), as well as the proposed treatment plan, please see below. She admits that she suffered a fall last night while turning. She slipped on a piece of cellophane. She admits that she hit on her right buttock. She also suffered a fall and injuried her left knee. She has swelling in her left leg. She admits that she is going to ortho tomorrow for evaluation of a bakers cyst and her knee pain.   Post-Procedure Assessment  01/24/2018 Procedure: Right-sided lumbar epidural steroid injection Pre-procedure pain score:  10/10 Post-procedure pain score: 8/10         Influential Factors: BMI:  26.79 kg/m Intra-procedural challenges: None observed.         Assessment challenges: None detected.              Reported side-effects: None.        Post-procedural adverse reactions or complications: None reported         Sedation: Please see nurses note. When no sedatives are used, the analgesic levels obtained are directly associated to the effectiveness of the local anesthetics. However, when sedation is provided, the level of analgesia obtained during the initial 1 hour following the intervention, is believed to be the result of a combination of factors. These factors may include, but are not limited to: 1. The effectiveness of the local anesthetics used. 2. The effects of the analgesic(s) and/or anxiolytic(s) used. 3. The degree of discomfort experienced by the patient at the time of the procedure. 4. The patients ability and reliability in recalling and recording the events. 5. The presence and influence of possible secondary gains and/or psychosocial factors. Reported result: Relief experienced during the 1st hour after the procedure: 100 % (Ultra-Short Term Relief)            Interpretative annotation: Clinically appropriate result. Analgesia during this period is likely to be Local Anesthetic and/or IV Sedative (Analgesic/Anxiolytic) related.          Effects of local anesthetic: The analgesic effects attained during this period are directly associated to the localized infiltration of local anesthetics and therefore cary significant diagnostic value as to the etiological location, or anatomical origin, of the pain. Expected duration of relief is directly dependent on the pharmacodynamics of the local anesthetic used. Long-acting (4-6 hours) anesthetics used.  Reported result: Relief during the next 4 to 6 hour after the procedure: 0 % (Short-Term Relief)            Interpretative annotation: Clinically appropriate result. Analgesia during this period is likely to be Local  Anesthetic-related.          Long-term benefit: Defined as the period of time past the expected duration of local anesthetics (1 hour for short-acting and 4-6 hours for long-acting). With the possible exception of prolonged sympathetic blockade from the local anesthetics, benefits during this period are typically attributed to, or associated with, other factors such as analgesic sensory neuropraxia, antiinflammatory effects, or beneficial biochemical changes provided by agents other than the local anesthetics.  Reported result: Extended relief following procedure: 0 % (Long-Term Relief)            Interpretative annotation: Clinically appropriate result. Good relief. No permanent benefit expected. Inflammation plays a part in the etiology to the pain.          Current benefits: Defined as reported results that persistent at this point in time.   Analgesia: 0 %            She admits that she had one or two days relief then she fell.  Function: Somewhat improved ROM: Somewhat improved Interpretative annotation: Recurrence of symptoms. No permanent benefit expected. Effective diagnostic intervention.          Interpretation: Results would suggest a successful diagnostic intervention.                  Plan:  Please see "Plan of Care" for details.                Laboratory Chemistry  Inflammation Markers (CRP: Acute Phase) (ESR: Chronic Phase) Lab Results  Component Value Date  ESRSEDRATE 4 06/07/2013                         Rheumatology Markers No results found for: RF, ANA, LABURIC, URICUR, LYMEIGGIGMAB, LYMEABIGMQN, HLAB27                      Renal Function Markers Lab Results  Component Value Date   BUN 31 (H) 11/19/2017   CREATININE 0.61 11/19/2017   GFRAA >60 11/19/2017   GFRNONAA >60 11/19/2017                             Hepatic Function Markers Lab Results  Component Value Date   AST 18 11/19/2017   ALT 8 (L) 11/19/2017   ALBUMIN 3.9 11/19/2017   ALKPHOS 105 11/19/2017                         Electrolytes Lab Results  Component Value Date   NA 140 11/19/2017   K 4.0 11/19/2017   CL 108 11/19/2017   CALCIUM 9.2 11/19/2017                        Neuropathy Markers No results found for: VITAMINB12, FOLATE, HGBA1C, HIV                      Bone Pathology Markers No results found for: VD25OH, VD125OH2TOT, BE6754GB2, EF0071QR9, 25OHVITD1, 25OHVITD2, 25OHVITD3, TESTOFREE, TESTOSTERONE                       Coagulation Parameters Lab Results  Component Value Date   PLT 185 11/19/2017                        Cardiovascular Markers Lab Results  Component Value Date   HGB 14.5 11/19/2017   HCT 42.7 11/19/2017                         CA Markers No results found for: CEA, CA125, LABCA2                      Note: Lab results reviewed.  Recent Diagnostic Imaging Results  DG C-Arm 1-60 Min-No Report Fluoroscopy was utilized by the requesting physician.  No radiographic  interpretation.   Complexity Note: Imaging results reviewed. Results shared with Sarah Donaldson, using Layman's terms.                         Meds   Current Outpatient Medications:  .  carbidopa-levodopa (SINEMET IR) 25-100 MG per tablet, TAKE 2 tablets in a.m. and 1 tab BY MOUTH every 4 hrs, Disp: , Rfl: 0 .  clonazePAM (KLONOPIN) 1 MG tablet, Take 2 mg by mouth daily. , Disp: , Rfl:  .  furosemide (LASIX) 40 MG tablet, Take 20 mg by mouth as needed for fluid. Twice a week, Disp: , Rfl:  .  naproxen (EC NAPROSYN) 500 MG EC tablet, Take 1 tablet (500 mg total) by mouth 2 (two) times daily with a meal., Disp: 60 tablet, Rfl: 0 .  neomycin-polymyxin b-dexamethasone (MAXITROL) 3.5-10000-0.1 OINT, Place 1 application into the left eye at bedtime., Disp: , Rfl: 2 .  omeprazole (PRILOSEC) 20 MG capsule, Take 1 capsule (20  mg total) by mouth daily., Disp: 90 capsule, Rfl: 0 .  pramipexole (MIRAPEX) 1.5 MG tablet, Take 1.5 mg by mouth 3 (three) times daily., Disp: , Rfl: 5 .  prednisoLONE  acetate (PRED FORTE) 1 % ophthalmic suspension, Place 1 drop into the left eye daily., Disp: , Rfl: 5 .  pregabalin (LYRICA) 25 MG capsule, Take 1 capsule (25 mg total) by mouth 4 (four) times daily., Disp: 120 capsule, Rfl: 2 .  promethazine (PHENERGAN) 12.5 MG tablet, Take 12.5 mg by mouth every 6 (six) hours as needed for nausea or vomiting., Disp: , Rfl:  .  ranitidine (ZANTAC) 150 MG capsule, Take 150 mg by mouth every evening., Disp: , Rfl:  .  selegiline (ELDEPRYL) 5 MG capsule, TAKE 1 CAPSULE BY MOUTH WITH BREAKFAST AND LUNCH., Disp: , Rfl: 1 .  traZODone (DESYREL) 50 MG tablet, TAKE 1-2 TABLETS NIGHTLY AS NEEDED FOR SLEEP., Disp: , Rfl: 1 .  Acetaminophen (TYLENOL ARTHRITIS PAIN PO), Take by mouth 2 (two) times daily., Disp: , Rfl:   ROS  Constitutional: Denies any fever or chills Gastrointestinal: No reported hemesis, hematochezia, vomiting, or acute GI distress Musculoskeletal: Denies any acute onset joint swelling, redness, loss of ROM, or weakness Neurological: No reported episodes of acute onset apraxia, aphasia, dysarthria, agnosia, amnesia, paralysis, loss of coordination, or loss of consciousness  Allergies  Sarah Donaldson is allergic to codeine sulfate and gabapentin.  [place]  Drug: Sarah Donaldson  reports that she does not use drugs. Alcohol:  reports that she drinks alcohol. Tobacco:  reports that she has quit smoking. She has never used smokeless tobacco. Medical:  has a past medical history of [facility]), Knee joint cyst, left (02/07/2018), Parkinson disease ([place]), and Parkinson disease ([place]). Surgical: Sarah Donaldson  has a past surgical history that includes Retinal detachment surgery; Bunionectomy; Nasal septum surgery; and Back surgery. Family: family history includes Heart disease in her mother.  Constitutional Exam  General appearance: Well nourished, well developed, and well hydrated. In no apparent acute distress Vitals:   03/07/18 1124  BP: 137/82  Pulse: 97  Temp:  98 F (36.7 C)  SpO2: 99%  Weight: 166 lb (75.3 kg)  Height: '5\' 6"'  (1.676 m)   BMI Assessment: Estimated body mass index is 26.79 kg/m as calculated from the following:   Height as of this encounter: '5\' 6"'  (1.676 m).   Weight as of this encounter: 166 lb (75.3 kg). Psych/Mental status: Alert, oriented x 3 (person, place, & time)       Eyes: PERLA Respiratory: No evidence of acute respiratory distress   Lumbar Spine Area Exam  Skin & Axial Inspection: No masses, redness, or swelling Alignment: Symmetrical Functional ROM: Unrestricted ROM       Stability: No instability detected Muscle Tone/Strength: Functionally intact. No obvious neuro-muscular anomalies detected. Sensory (Neurological): Unimpaired Palpation: Complains of area being tender to palpation       Provocative Tests: Lumbar Hyperextension/rotation test: Positive       Lumbar quadrant test (Kemp's test): deferred today       Lumbar Lateral bending test: deferred today       Patrick's Maneuver: deferred today                   FABER test: deferred today                   Thigh-thrust test: deferred today       S-I compression test: deferred today  S-I distraction test: deferred today        Gait & Posture Assessment  Ambulation: Unassisted Gait: Relatively normal for age and body habitus Posture: WNL   Lower Extremity Exam    Side: Right lower extremity  Side: Left lower extremity  Skin & Extremity Inspection: Skin color, temperature, and hair growth are WNL. No peripheral edema or cyanosis. No masses, redness, swelling, asymmetry, or associated skin lesions. No contractures.  Skin & Extremity Inspection: asymmetry,swelling discoloration   Functional ROM: Unrestricted ROM                  Functional ROM: Painful restricted ROM for knee joint          Muscle Tone/Strength: Functionally intact. No obvious neuro-muscular anomalies detected.  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 Diagnoses of Lumbar radiculopathy, Chronic myofascial pain, Chronic pain of right lower extremity, Chronic pain of left knee, and Multiple falls were also pertinent to this visit.  Status Diagnosis  Persistent Persistent Persistent 1. Lumbar spondylosis   2. Lumbar radiculopathy   3. Chronic myofascial pain   4. Chronic pain of right lower extremity   5. Chronic pain of left knee   6. Multiple falls     Problems updated and reviewed during this visit: Problem  Lumbar Spondylosis  Chronic Pain of Left Knee   Plan of Care  Pharmacotherapy (Medications Ordered): Meds ordered this encounter  Medications  . omeprazole (PRILOSEC) 20 MG capsule    Sig: Take 1 capsule (20 mg total) by mouth daily.    Dispense:  90 capsule    Refill:  0    Order Specific Question:   Supervising Provider    Answer:   Milinda Pointer [phone]  . pregabalin (LYRICA) 25 MG capsule    Sig: Take 1 capsule (25 mg total) by mouth 4 (four) times daily.    Dispense:  120 capsule    Refill:  2    Do not place this medication, or any other prescription from our practice, on "Automatic Refill". Patient may have prescription filled one day early if pharmacy is closed on scheduled refill date.    Order Specific Question:   Supervising Provider    Answer:   Milinda Pointer [[id number]]   New Prescriptions   No medications on file   Medications administered today: Sarah Donaldson had no medications administered during this visit. Lab-work, procedure(s), and/or referral(s): Orders Placed This Encounter  Procedures  . DG Lumbar Spine Complete W/Bend  . Ambulatory referral to Physical Therapy   Imaging and/or referral(s): AMB REFERRAL TO PHYSICAL THERAPY  Interventional therapies: Planned, scheduled, and/or pending:   Not at this time.      Provider-requested follow-up: Return in about 3 months (around 06/07/2018) for MedMgmt with Me Donella Stade Edison Pace).  Future Appointments  Date Time Provider [place]  05/18/2018  8:30 AM [facility]  05/19/2018 10:15 AM [facility] None  05/19/2018 10:45 AM Cammie Sickle, MD [facility] None  06/02/2018 10:45 AM Vevelyn Francois, NP [facility] None   Primary Care Physician: Cletis Athens, MD Location: [facility] Note by: Vevelyn Francois NP Date: 03/07/2018; Time: 2:02 PM  Pain Score Disclaimer: We use the NRS-11 scale. This is a self-reported, subjective measurement of pain severity with only modest accuracy. It is used primarily to identify changes  within a particular patient. It must be understood that outpatient pain scales are significantly less accurate that those used for research, where they can be applied under ideal controlled circumstances with minimal exposure to variables. In reality, the score is likely to be a combination of pain intensity and pain affect, where pain affect describes the degree of emotional arousal or changes in action readiness caused by the sensory experience of pain. Factors such as social and work situation, setting, emotional state, anxiety levels, expectation, and prior pain experience may influence pain perception and show large inter-individual differences that may also be affected by time variables.  Patient instructions provided during this appointment: Patient Instructions   ____________________________________________________________________________________________  Pain Scale  Introduction: The pain score used by this practice is the Verbal Numerical Rating Scale (VNRS-11). This is an 11-point scale. It is for adults and children 10 years or older. There are significant differences in how the pain score is reported, used, and applied. Forget everything you learned in the past  and learn this scoring system.  General Information: The scale should reflect your current level of pain. Unless you are specifically asked for the level of your worst pain, or your average pain. If you are asked for one of these two, then it should be understood that it is over the past 24 hours.  Basic Activities of Daily Living (ADL): Personal hygiene, dressing, eating, transferring, and using restroom.  Instructions: Most patients tend to report their level of pain as a combination of two factors, their physical pain and their psychosocial pain. This last one is also known as "suffering" and it is reflection of how physical pain affects you socially and psychologically. From now on, report them separately. From this point on, when asked to report your pain level, report only your physical pain. Use the following table for reference.  Pain Clinic Pain Levels (0-5/10)  Pain Level Score  Description  No Pain 0   Mild pain 1 Nagging, annoying, but does not interfere with basic activities of daily living (ADL). Patients are able to eat, bathe, get dressed, toileting (being able to get on and off the toilet and perform personal hygiene functions), transfer (move in and out of bed or a chair without assistance), and maintain continence (able to control bladder and bowel functions). Blood pressure and heart rate are unaffected. A normal heart rate for a healthy adult ranges from 60 to 100 bpm (beats per minute).   Mild to moderate pain 2 Noticeable and distracting. Impossible to hide from other people. More frequent flare-ups. Still possible to adapt and function close to normal. It can be very annoying and may have occasional stronger flare-ups. With discipline, patients may get used to it and adapt.   Moderate pain 3 Interferes significantly with activities of daily living (ADL). It becomes difficult to feed, bathe, get dressed, get on and off the toilet or to perform personal hygiene functions.  Difficult to get in and out of bed or a chair without assistance. Very distracting. With effort, it can be ignored when deeply involved in activities.   Moderately severe pain 4 Impossible to ignore for more than a few minutes. With effort, patients may still be able to manage work or participate in some social activities. Very difficult to concentrate. Signs of autonomic nervous system discharge are evident: dilated pupils (mydriasis); mild sweating (diaphoresis); sleep interference. Heart rate becomes elevated (>115 bpm). Diastolic blood pressure (lower number) rises above 100 mmHg. Patients find relief in laying  down and not moving.   Severe pain 5 Intense and extremely unpleasant. Associated with frowning face and frequent crying. Pain overwhelms the senses.  Ability to do any activity or maintain social relationships becomes significantly limited. Conversation becomes difficult. Pacing back and forth is common, as getting into a comfortable position is nearly impossible. Pain wakes you up from deep sleep. Physical signs will be obvious: pupillary dilation; increased sweating; goosebumps; brisk reflexes; cold, clammy hands and feet; nausea, vomiting or dry heaves; loss of appetite; significant sleep disturbance with inability to fall asleep or to remain asleep. When persistent, significant weight loss is observed due to the complete loss of appetite and sleep deprivation.  Blood pressure and heart rate becomes significantly elevated. Caution: If elevated blood pressure triggers a pounding headache associated with blurred vision, then the patient should immediately seek attention at an urgent or emergency care unit, as these may be signs of an impending stroke.    Emergency Department Pain Levels (6-10/10)  Emergency Room Pain 6 Severely limiting. Requires emergency care and should not be seen or managed at an outpatient pain management facility. Communication becomes difficult and requires great  effort. Assistance to reach the emergency department may be required. Facial flushing and profuse sweating along with potentially dangerous increases in heart rate and blood pressure will be evident.   Distressing pain 7 Self-care is very difficult. Assistance is required to transport, or use restroom. Assistance to reach the emergency department will be required. Tasks requiring coordination, such as bathing and getting dressed become very difficult.   Disabling pain 8 Self-care is no longer possible. At this level, pain is disabling. The individual is unable to do even the most "basic" activities such as walking, eating, bathing, dressing, transferring to a bed, or toileting. Fine motor skills are lost. It is difficult to think clearly.   Incapacitating pain 9 Pain becomes incapacitating. Thought processing is no longer possible. Difficult to remember your own name. Control of movement and coordination are lost.   The worst pain imaginable 10 At this level, most patients pass out from pain. When this level is reached, collapse of the autonomic nervous system occurs, leading to a sudden drop in blood pressure and heart rate. This in turn results in a temporary and dramatic drop in blood flow to the brain, leading to a loss of consciousness. Fainting is one of the body's self defense mechanisms. Passing out puts the brain in a calmed state and causes it to shut down for a while, in order to begin the healing process.    Summary: 1. Refer to this scale when providing [place] with your pain level. 2. Be accurate and careful when reporting your pain level. This will help with your care. 3. Over-reporting your pain level will lead to loss of credibility. 4. Even a level of 1/10 means that there is pain and will be treated at our facility. 5. High, inaccurate reporting will be documented as "Symptom Exaggeration", leading to loss of credibility and suspicions of possible secondary gains such as obtaining more  narcotics, or wanting to appear disabled, for fraudulent reasons. 6. Only pain levels of 5 or below will be seen at our facility. 7. Pain levels of 6 and above will be sent to the Emergency Department and the appointment cancelled. ____________________________________________________________________________________________

## 2018-03-07 NOTE — Patient Instructions (Signed)

## 2018-03-08 ENCOUNTER — Other Ambulatory Visit: Payer: Self-pay | Admitting: Orthopedic Surgery

## 2018-03-08 ENCOUNTER — Ambulatory Visit
Admission: RE | Admit: 2018-03-08 | Discharge: 2018-03-08 | Disposition: A | Discharge status: Home / Self Care | Payer: Medicare Other | Source: Ambulatory Visit | Attending: Orthopedic Surgery | Admitting: Orthopedic Surgery

## 2018-03-08 ENCOUNTER — Other Ambulatory Visit: Payer: Self-pay

## 2018-03-08 ENCOUNTER — Ambulatory Visit: Admission: RE | Admit: 2018-03-08 | Payer: Medicare Other | Source: Ambulatory Visit | Admitting: Orthopedic Surgery

## 2018-03-08 DIAGNOSIS — M7122 Synovial cyst of popliteal space [Baker], left knee: Secondary | ICD-10-CM | POA: Diagnosis not present

## 2018-03-08 DIAGNOSIS — R600 Localized edema: Secondary | ICD-10-CM | POA: Diagnosis not present

## 2018-03-08 DIAGNOSIS — R2242 Localized swelling, mass and lump, left lower limb: Secondary | ICD-10-CM

## 2018-03-08 DIAGNOSIS — M1711 Unilateral primary osteoarthritis, right knee: Secondary | ICD-10-CM | POA: Diagnosis not present

## 2018-03-08 DIAGNOSIS — M1712 Unilateral primary osteoarthritis, left knee: Secondary | ICD-10-CM | POA: Diagnosis not present

## 2018-03-08 NOTE — Progress Notes (Signed)
Results were reviewed and found to be: mildly abnormal  No acute injury or pathology identified  Review would suggest interventional pain management techniques may be of benefit 

## 2018-03-09 DIAGNOSIS — G8929 Other chronic pain: Secondary | ICD-10-CM | POA: Diagnosis not present

## 2018-03-09 DIAGNOSIS — G20 Parkinson's disease: Secondary | ICD-10-CM | POA: Diagnosis not present

## 2018-03-09 DIAGNOSIS — R200 Anesthesia of skin: Secondary | ICD-10-CM | POA: Diagnosis not present

## 2018-03-09 DIAGNOSIS — M545 Low back pain: Secondary | ICD-10-CM | POA: Diagnosis not present

## 2018-03-09 DIAGNOSIS — G4752 REM sleep behavior disorder: Secondary | ICD-10-CM | POA: Diagnosis not present

## 2018-03-10 DIAGNOSIS — D2239 Melanocytic nevi of other parts of face: Secondary | ICD-10-CM | POA: Diagnosis not present

## 2018-03-10 DIAGNOSIS — D1801 Hemangioma of skin and subcutaneous tissue: Secondary | ICD-10-CM | POA: Diagnosis not present

## 2018-03-10 DIAGNOSIS — L821 Other seborrheic keratosis: Secondary | ICD-10-CM | POA: Diagnosis not present

## 2018-03-10 DIAGNOSIS — L565 Disseminated superficial actinic porokeratosis (DSAP): Secondary | ICD-10-CM | POA: Diagnosis not present

## 2018-03-10 DIAGNOSIS — L570 Actinic keratosis: Secondary | ICD-10-CM | POA: Diagnosis not present

## 2018-03-24 ENCOUNTER — Ambulatory Visit: Payer: Medicare Other | Attending: Nurse Practitioner

## 2018-03-24 DIAGNOSIS — M5417 Radiculopathy, lumbosacral region: Secondary | ICD-10-CM

## 2018-03-24 DIAGNOSIS — M5441 Lumbago with sciatica, right side: Secondary | ICD-10-CM | POA: Diagnosis not present

## 2018-03-24 DIAGNOSIS — M6281 Muscle weakness (generalized): Secondary | ICD-10-CM | POA: Diagnosis not present

## 2018-03-24 DIAGNOSIS — G8929 Other chronic pain: Secondary | ICD-10-CM | POA: Insufficient documentation

## 2018-03-24 DIAGNOSIS — R262 Difficulty in walking, not elsewhere classified: Secondary | ICD-10-CM | POA: Insufficient documentation

## 2018-03-24 DIAGNOSIS — M5442 Lumbago with sciatica, left side: Secondary | ICD-10-CM | POA: Insufficient documentation

## 2018-03-24 NOTE — Patient Instructions (Addendum)
Seated hip extension isometrics   Sitting on a chair,    Squeeze your rear end muscles together and press your L foot onto the floor.     Hold for 5 seconds    Repeat 10 times   Perform 2-3 sets daily.      This is a corrective exercise. Once you no longer have symptoms, you can stop.

## 2018-03-24 NOTE — Therapy (Signed)
[facility] [address], [place], [address] Phone: [phone]   Fax:  [phone]  Physical Therapy Evaluation  Patient Details  Name: Sarah Donaldson MRN: [id number] Date of Birth: [birth year] Referring Provider: Gurney Maxin, MD   Encounter Date: 03/24/2018  PT End of Session - 03/24/18 1116    Visit Number  1    Number of Visits  17    Date for PT Re-Evaluation  05/19/18    Authorization Type  1    Authorization Time Period  of 10 progress report    PT Start Time  1117   pt arrived late   PT Stop Time  1219    PT Time Calculation (min)  62 min    Equipment Utilized During Treatment  Gait belt   pt SPC   Activity Tolerance  Patient limited by pain;Patient tolerated treatment well    Behavior During Therapy  Restless;Impulsive;WFL for tasks assessed/performed       Past Medical History:  Diagnosis Date  . Cancer ([place])    Non-hodgkin's lymphoma (in remission)  . Knee joint cyst, left 02/07/2018  . Parkinson disease ([place])   . Parkinson disease [facility])     Past Surgical History:  Procedure Laterality Date  . BACK SURGERY    . BUNIONECTOMY    . NASAL SEPTUM SURGERY    . RETINAL DETACHMENT SURGERY      There were no vitals filed for this visit.   Subjective Assessment - 03/24/18 1119    Subjective  Back pain: 5/10 currently (pt sitting), 10/10 at worst for the past 3 months, worst at night.      Pertinent History  Chronic back pain. Has had Parkinsons since she was 67 years old. Has had back pain for years. Worsened around 2 years ago and feels cramping as well as pins and needles R medial and lateral thigh as well as L thigh.  Pt states that [name] increased last week. Difficulty bending her L leg and to kneel due to pain. Wears a knee brace to help with the swelling.   Pt states that when she walks on her L LE and steps on it, it gives way which started around the past 2 days.   Pt  states that her back pain has gotten worse since 2 years ago.  Walking makes her legs feel heavy. Pt uses her SPC sporadically when her legs bother her.  Denies loss of bowel or bladder control.  Pt unsure if she has tingling or numness bilateral inner thigh.  Pt also states having Non-Hodgkins lymphoma cancer.   Pt states tinlging and numbness bilateral posterior LE going down to her feet.   Pt also states falling 3 times within the past 6 months.  Pt tripped on a stool, pt also was running and in a hurry, did not watch where she was going and tripped on a basket. Pt also fell off the bed when she was sleeping.     Back pain predominantly R posterior pelvis area.    Patient Stated Goals  Be able to stand up straighter.     Currently in Pain?  Yes    Pain Score  5     Pain Location  Back    Pain Type  Chronic pain    Aggravating Factors   Back pain: side bending, bending over, standing. Getting from the supine position. Standing for about 1 minute then sitting down  onto a chair causes bilateral thigh pins and needles. Bilateral LE feels heavy when standing.  Moving her legs and then stopping causes cramps.       Pain Relieving Factors  Laying down flat on her back, tylenol extra strength. Pt states pressing her R PSIS makes it feel better.          [facility] PT Assessment - 03/24/18 0001      Assessment   Medical Diagnosis  Chronic low back pain     Referring Provider  Gurney Maxin, MD    Onset Date/Surgical Date  03/11/18   Date physical therapy referral signed. Chronic conditon     Precautions   Precaution Comments  hx of back surgery      Restrictions   Other Position/Activity Restrictions  No known restrictions      Balance Screen   Has the patient fallen in the past 6 months  Yes    How many times?  3    Has the patient had a decrease in activity level because of a fear of falling?   No    Is the patient reluctant to leave their home because of a fear of falling?   No      Home  Environment   Additional Comments  Pt lives in a 1 story home with husband, 3 steps to enter, R rail assist at garage door, the door they usually enter.       Prior Function   Vocation  Retired    [employer]  PLOF: better able to stand for longer periods, perform standing tasks, bend over, get up from the supine position with less back pain.       Observation/Other Assessments   Focus on Therapeutic Outcomes (FOTO)   lumbar spine FOTO: 37      Posture/Postural Control   Posture Comments  L trunk rotation and L side bending, kyphosis, R weight shifting, bilaterally protracted shoulders and neck, R foot back.       AROM   Overall AROM Comments  Lumbar AROM performed in sitting    Lumbar Flexion  WFL, no symptoms in sitting    Lumbar Extension  limited with R low back and R lateral thigh symptoms.     Lumbar - Right Side Bend  limited with R LBP > L    Lumbar - Left Side [facility] with slight R low back symptoms    Lumbar - Right Rotation  WFL with R low back pain > L    Lumbar - Left Rotation  WFL, no pain      Strength   Right Hip Flexion  4-/5   with low back and R anterior lateral pain.    Right Hip Extension  4-/5   seated hip extension isometrics   Right Hip ABduction  4/5   seated clamshell isometrics   Left Hip Flexion  3-/5   with anterior lateral thigh pulling; Vastus lateralis tensio   Left Hip Extension  3+/5   seated hip extension isometrics   Left Hip ABduction  4-/5   seated clamshell isometrics   Right Knee Flexion  4+/5    Right Knee Extension  5/5    Left Knee Flexion  4/5    Left Knee Extension  5/5      Palpation   Palpation comment  R lumbar paraspinal muscle tension palpated. TTP R PSIS.   Puffiness palpated L posterior knee      Ambulation/Gait  Gait Comments  Pt ambulates with SPC on L side with L lateral lean, pelvic drop, R femoral adduction and IR during R LE stance phase. Pt states feeling cramping R lateral thigh and L posterior knee  pain when walking.                   PT Education - 03/24/18 2205    Education Details  ther-ex, HEP, plan of care    Person(s) Educated  Patient;Spouse    Methods  Explanation;Demonstration;Tactile cues;Verbal cues;Handout    Comprehension  Verbalized understanding;Returned demonstration;Verbal cues required;Tactile cues required;Need further instruction       Objective measurements completed on examination: See above findings.   Pt unsure if she has tingling or numbness bilateral inner thighs. She was recommended to call her referring MD to let him know about it to rule out saddle anesthesia. Pt and husband verbalized understanding.   Pt later states that her tingling is in her hips and not her inner thighs  Back pain, L knee pain and weakness, hx of bucking L LE  Denies blood pressure problems  Back pain predominantly R posterior pelvis area.    Therapeutic exercise   Seated L hip extension isometrics 10x3 with 5 second holds No back pain afterwards in sitting  Reviewed HEP. Pt demonstrated and verbalized understanding. Handout provided.   Improved exercise technique, movement at target joints, use of target muscles after mod verbal, visual, tactile cues.   Patient is a 67 year old female who came to physical therapy secondary to chronic back pain. She also presents with bilateral LE symptoms, decreased trunk and femoral control, altered gait pattern and posture, bilateral hip weakness, and difficulty performing functional tasks such as walking, and performing movements such as bending over. Patient will benefit from skilled physical therapy services to address the aforementioned deficits.     PT Short Term Goals - 03/24/18 1603      PT SHORT TERM GOAL #1   Title  Patient will be independent with her HEP to promote trunk and LE strength, and to help decrease back and bilateral LE pain.     Baseline  --    Time  3    Period  Weeks    Status  New    Target  Date  04/14/18        PT Long Term Goals - 03/24/18 2146      PT LONG TERM GOAL #1   Title  Patient will have a decrease in back pain to 5/10 or less at worst to promote ability to ambulate, peform standing tasks more comfortably.     Baseline  10/10 back pain at worst for the past 3 months. (03/24/2018)    Time  8    Period  Weeks    Status  New    Target Date  05/19/18      PT LONG TERM GOAL #2   Title  Patient will improve her lumbar spine FOTO score by at least 10 points as a demonstration of improved function.     Baseline  Lumbar spine FOTO: 37 (03/24/2018)    Time  8    Period  Weeks    Status  New    Target Date  05/19/18      PT LONG TERM GOAL #3   Title  Pt will improve bilateral hip abduction and extension strength by at least 1/2 MMT grade to promote ability to perform standing tasks with less back pain.  Time  8    Period  Weeks    Status  New    Target Date  05/19/18           Plan - 03/24/18 1335    Clinical Impression Statement  Patient is a 67 year old female who came to physical therapy secondary to chronic back pain. She also presents with bilateral LE symptoms, decreased trunk and femoral control, altered gait pattern and posture, bilateral hip weakness, and difficulty performing functional tasks such as walking, and performing movements such as bending over. Patient will benefit from skilled physical therapy services to address the aforementioned deficits.     History and Personal Factors relevant to plan of care:  medical history, chronicity of back and LE pain, parkinson's, L knee pain, weakness, difficulty walking, difficulty moving around, fall history. Husband support at home.     Clinical Presentation  Evolving    Clinical Presentation due to:  Patient states that her back pain is worsening.     Clinical Decision Making  Moderate    Rehab Potential  Fair    Clinical Impairments Affecting Rehab Potential  (-) Chronicity of back pain, B LE pain,  Parkinson's, age; (+) motivated    PT Frequency  2x / week    PT Duration  8 weeks    PT Treatment/Interventions  Therapeutic activities;Therapeutic exercise;Balance training;Neuromuscular re-education;Patient/family education;Manual techniques;Dry needling;Aquatic Therapy;Electrical Stimulation;Iontophoresis 4mg /ml Dexamethasone;Gait training    PT Next Visit Plan  trunk, hip strengthening, lumbopelvic control, gait training, manual techniques, modalities PRN    Consulted and Agree with Plan of Care  Patient;Family member/caregiver    Family Member Consulted  husband       Patient will benefit from skilled therapeutic intervention in order to improve the following deficits and impairments:  Pain, Improper body mechanics, Postural dysfunction, Decreased strength, Difficulty walking, Decreased safety awareness, Decreased balance, Abnormal gait  Visit Diagnosis: Chronic bilateral low back pain with bilateral sciatica - Plan: PT plan of care cert/re-cert  Radiculopathy, lumbosacral region - Plan: PT plan of care cert/re-cert  Muscle weakness (generalized) - Plan: PT plan of care cert/re-cert  Difficulty in walking, not elsewhere classified - Plan: PT plan of care cert/re-cert     Problem List Patient Active Problem List   Diagnosis Date Noted  . Lumbar spondylosis 03/07/2018  . Chronic pain of left knee 03/07/2018  . Chronic upper back pain 12/08/2017  . Benign neoplasm of hand 10/20/2017  . Chronic pain syndrome 10/20/2017  . Chronic pain of right lower extremity 10/20/2017  . Chronic myofascial pain 10/20/2017  . Numbness and tingling 07/12/2017  . Sleep difficulties 07/12/2017  . Low back pain 01/18/2017  . Sleep behavior disorder, REM 01/18/2017  . Lumbar radiculopathy 11/01/2016  . Follicular lymphoma of extranodal and solid organ sites [facility]) 05/01/2016  . Lymphoma ([place]) 04/30/2016  . Hallux limitus 07/27/2015  . Low back pain with sciatica 02/13/2015  . Bilateral low  back pain with sciatica 02/13/2015  . H/O adenomatous polyp of colon 04/06/2014  . Hx of adenomatous colonic polyps 04/06/2014  . Back ache 01/25/2014  . Adiposity 01/25/2014  . REM sleep behavior disorder 01/25/2014  . Disordered sleep 01/25/2014  . Has a tremor 01/25/2014  . Obesity, unspecified 01/25/2014  . Sleep disorder 01/25/2014  . Backache 01/25/2014  . Tremor 01/25/2014  . Difficulty hearing 06/08/2012  . Hearing loss 06/08/2012  . Anxiety 06/06/2012  . Colon polyp 06/06/2012  . Clinical depression 06/06/2012  . Hypercholesteremia 06/06/2012  .  Idiopathic Parkinson's disease ([place]) 06/06/2012  . Detached retina 06/06/2012  . Depression 06/06/2012  . Retinal detachment 06/06/2012  . Parkinson's disease ([place]) 06/06/2012     Joneen Boers PT, DPT  03/24/2018, 10:38 PM  [facility] [address] [place], [address] Phone: [phone]   Fax:  [phone]  Name: Sarah Donaldson MRN: [id number] Date of Birth: 01-06-1951

## 2018-03-28 ENCOUNTER — Ambulatory Visit: Payer: Medicare Other

## 2018-03-28 DIAGNOSIS — R262 Difficulty in walking, not elsewhere classified: Secondary | ICD-10-CM

## 2018-03-28 DIAGNOSIS — M5442 Lumbago with sciatica, left side: Principal | ICD-10-CM

## 2018-03-28 DIAGNOSIS — G8929 Other chronic pain: Secondary | ICD-10-CM

## 2018-03-28 DIAGNOSIS — M5441 Lumbago with sciatica, right side: Secondary | ICD-10-CM | POA: Diagnosis not present

## 2018-03-28 DIAGNOSIS — M5417 Radiculopathy, lumbosacral region: Secondary | ICD-10-CM

## 2018-03-28 DIAGNOSIS — M6281 Muscle weakness (generalized): Secondary | ICD-10-CM | POA: Diagnosis not present

## 2018-03-28 NOTE — Therapy (Signed)
[facility] [address] [place], [address] Phone: [phone]   Fax:  [phone]  Physical Therapy Treatment  Patient Details  Name: Sarah Donaldson MRN: [id number] Date of Birth: October 10, 1950 Referring Provider: Gurney Maxin, MD   Encounter Date: 03/28/2018  PT End of Session - 03/28/18 1322    Visit Number  2    Number of Visits  17    Date for PT Re-Evaluation  05/19/18    Authorization Type  2    Authorization Time Period  of 10 progress report    PT Start Time  6734   Pt arrived 22 minutes late   PT Stop Time  1346    PT Time Calculation (min)  23 min    Equipment Utilized During Treatment  Gait belt   pt SPC   Activity Tolerance  Patient limited by pain;Patient tolerated treatment well    Behavior During Therapy  Restless;Impulsive;WFL for tasks assessed/performed       Past Medical History:  Diagnosis Date  . Cancer ([place])    Non-hodgkin's lymphoma (in remission)  . Knee joint cyst, left 02/07/2018  . Parkinson disease ([place])   . Parkinson disease [facility])     Past Surgical History:  Procedure Laterality Date  . BACK SURGERY    . BUNIONECTOMY    . NASAL SEPTUM SURGERY    . RETINAL DETACHMENT SURGERY      There were no vitals filed for this visit.  Subjective Assessment - 03/28/18 1325    Subjective  Back is bothering her from doing housework from lifting and bending down.  9/10 back pain currently.  Pt states that the HEP last time bothered her all night.  Pt states that if she focuses a lot, she can sit still.     Pertinent History  Chronic back pain. Has had Parkinsons since she was 67 years old. Has had back pain for years. Worsened around 2 years ago and feels cramping as well as pins and needles R medial and lateral thigh as well as L thigh.  Pt states that [name] increased last week. Difficulty bending her L leg and to kneel due to pain. Wears a knee brace to help with the  swelling.   Pt states that when she walks on her L LE and steps on it, it gives way which started around the past 2 days.   Pt states that her back pain has gotten worse since 2 years ago.  Walking makes her legs feel heavy. Pt uses her SPC sporadically when her legs bother her.  Denies loss of bowel or bladder control.  Pt unsure if she has tingling or numness bilateral inner thigh.  Pt also states having Non-Hodgkins lymphoma cancer.   Pt states tinlging and numbness bilateral posterior LE going down to her feet.   Pt also states falling 3 times within the past 6 months.  Pt tripped on a stool, pt also was running and in a hurry, did not watch where she was going and tripped on a basket. Pt also fell off the bed when she was sleeping.     Back pain predominantly R posterior pelvis area.    Patient Stated Goals  Be able to stand up straighter.     Currently in Pain?  Yes    Pain Score  9  PT Education - 03/28/18 1620    Education Details  ther-ex, HEP    Person(s) Educated  Patient    Methods  Explanation;Demonstration;Tactile cues;Verbal cues    Comprehension  Returned demonstration;Verbalized understanding      Objectives  Therapeutic exercise   Seated naval ins 10x 5 seconds for 2 sets   Seated glute max squeeze 10x5 seconds   Pt was recommended to use rw secondary to unsteadiness. LOB x 1 with PT min to mod A to recover  Seated bilateral shoulder extension isometrics 10x5 seconds  Pt was recommended to try to stay still in sitting as best as possible. Pt states decreased back pain.   Seated bilateral scapular retraction 10x2. Feels good.   Improved exercise technique, movement at target joints, use of target muscles after mod verbal, visual, tactile cues.     Worked on trunk muscle and glute strengthening to promote core control to help decrease back pain. Worked on scapular retractions to promote thoracic extension to  help decrease low back extension pressure. Pt also demonstrates tendency to have difficulty controlling twisting/shifting like movements which may play a factor in her back pain and balance. When pt focused to stay still in sitting, pt states that her back felt better. Pt tolerated session well without aggravation of movements.      PT Short Term Goals - 03/24/18 1603      PT SHORT TERM GOAL #1   Title  Patient will be independent with her HEP to promote trunk and LE strength, and to help decrease back and bilateral LE pain.     Baseline  --    Time  3    Period  Weeks    Status  New    Target Date  04/14/18        PT Long Term Goals - 03/24/18 2146      PT LONG TERM GOAL #1   Title  Patient will have a decrease in back pain to 5/10 or less at worst to promote ability to ambulate, peform standing tasks more comfortably.     Baseline  10/10 back pain at worst for the past 3 months. (03/24/2018)    Time  8    Period  Weeks    Status  New    Target Date  05/19/18      PT LONG TERM GOAL #2   Title  Patient will improve her lumbar spine FOTO score by at least 10 points as a demonstration of improved function.     Baseline  Lumbar spine FOTO: 37 (03/24/2018)    Time  8    Period  Weeks    Status  New    Target Date  05/19/18      PT LONG TERM GOAL #3   Title  Pt will improve bilateral hip abduction and extension strength by at least 1/2 MMT grade to promote ability to perform standing tasks with less back pain.     Time  8    Period  Weeks    Status  New    Target Date  05/19/18            Plan - 03/28/18 1620    Clinical Impression Statement  Worked on trunk muscle and glute strengthening to promote core control to help decrease back pain. Worked on scapular retractions to promote thoracic extension to help decrease low back extension pressure. Pt also demonstrates tendency to have difficulty controlling twisting/shifting like movements which may play a  factor in her back  pain and balance. When pt focused to stay still in sitting, pt states that her back felt better. Pt tolerated session well without aggravation of movements.     Rehab Potential  Fair    Clinical Impairments Affecting Rehab Potential  (-) Chronicity of back pain, B LE pain, Parkinson's, age; (+) motivated    PT Frequency  2x / week    PT Duration  8 weeks    PT Treatment/Interventions  Therapeutic activities;Therapeutic exercise;Balance training;Neuromuscular re-education;Patient/family education;Manual techniques;Dry needling;Aquatic Therapy;Electrical Stimulation;Iontophoresis 4mg /ml Dexamethasone;Gait training    PT Next Visit Plan  trunk, hip strengthening, lumbopelvic control, gait training, manual techniques, modalities PRN    Consulted and Agree with Plan of Care  Patient;Family member/caregiver    Family Member Consulted  husband       Patient will benefit from skilled therapeutic intervention in order to improve the following deficits and impairments:  Pain, Improper body mechanics, Postural dysfunction, Decreased strength, Difficulty walking, Decreased safety awareness, Decreased balance, Abnormal gait  Visit Diagnosis: Chronic bilateral low back pain with bilateral sciatica  Radiculopathy, lumbosacral region  Muscle weakness (generalized)  Difficulty in walking, not elsewhere classified     Problem List Patient Active Problem List   Diagnosis Date Noted  . Lumbar spondylosis 03/07/2018  . Chronic pain of left knee 03/07/2018  . Chronic upper back pain 12/08/2017  . Benign neoplasm of hand 10/20/2017  . Chronic pain syndrome 10/20/2017  . Chronic pain of right lower extremity 10/20/2017  . Chronic myofascial pain 10/20/2017  . Numbness and tingling 07/12/2017  . Sleep difficulties 07/12/2017  . Low back pain 01/18/2017  . Sleep behavior disorder, REM 01/18/2017  . Lumbar radiculopathy 11/01/2016  . Follicular lymphoma of extranodal and solid organ sites [facility])  05/01/2016  . Lymphoma ([place]) 04/30/2016  . Hallux limitus 07/27/2015  . Low back pain with sciatica 02/13/2015  . Bilateral low back pain with sciatica 02/13/2015  . H/O adenomatous polyp of colon 04/06/2014  . Hx of adenomatous colonic polyps 04/06/2014  . Back ache 01/25/2014  . Adiposity 01/25/2014  . REM sleep behavior disorder 01/25/2014  . Disordered sleep 01/25/2014  . Has a tremor 01/25/2014  . Obesity, unspecified 01/25/2014  . Sleep disorder 01/25/2014  . Backache 01/25/2014  . Tremor 01/25/2014  . Difficulty hearing 06/08/2012  . Hearing loss 06/08/2012  . Anxiety 06/06/2012  . Colon polyp 06/06/2012  . Clinical depression 06/06/2012  . Hypercholesteremia 06/06/2012  . Idiopathic Parkinson's disease ([place]) 06/06/2012  . Detached retina 06/06/2012  . Depression 06/06/2012  . Retinal detachment 06/06/2012  . Parkinson's disease ([place]) 06/06/2012     Joneen Boers PT, DPT  03/28/2018, 4:37 PM  [facility] [address] [place], [address] Phone: [phone]   Fax:  [phone]  Name: CAYLYN TEDESCHI MRN: [id number] Date of Birth: [birth year]

## 2018-03-28 NOTE — Patient Instructions (Addendum)
Pt was recommended to stop the seated hip extension exercise.       Sitting on a chair:   Gently pull your belly button in for 5 seconds   Repeat 10 times    Perform at least 3 sets daily.      Sitting on a chair, squeeze your rear end muscles together for 5 seconds,    Perform at least 3 sets of 10 daily.     Sitting on a chair   Squeeze your shoulder blade muscles together.    Repeat 10 times   Perform 3 sets daily.

## 2018-03-30 ENCOUNTER — Other Ambulatory Visit: Payer: Self-pay | Admitting: Internal Medicine

## 2018-03-30 ENCOUNTER — Ambulatory Visit: Payer: Medicare Other

## 2018-03-30 DIAGNOSIS — R262 Difficulty in walking, not elsewhere classified: Secondary | ICD-10-CM

## 2018-03-30 DIAGNOSIS — M5442 Lumbago with sciatica, left side: Secondary | ICD-10-CM | POA: Diagnosis not present

## 2018-03-30 DIAGNOSIS — M5441 Lumbago with sciatica, right side: Principal | ICD-10-CM

## 2018-03-30 DIAGNOSIS — M5417 Radiculopathy, lumbosacral region: Secondary | ICD-10-CM

## 2018-03-30 DIAGNOSIS — M6281 Muscle weakness (generalized): Secondary | ICD-10-CM | POA: Diagnosis not present

## 2018-03-30 DIAGNOSIS — G8929 Other chronic pain: Secondary | ICD-10-CM

## 2018-03-30 DIAGNOSIS — Z1231 Encounter for screening mammogram for malignant neoplasm of breast: Secondary | ICD-10-CM

## 2018-03-30 NOTE — Therapy (Signed)
[facility] [address] [place], [address] Phone: [phone]   Fax:  [phone]  Physical Therapy Treatment  Patient Details  Name: Sarah Donaldson MRN: [id number] Date of Birth: 09/10/1950 Referring Provider: Gurney Maxin, MD   Encounter Date: 03/30/2018  PT End of Session - 03/30/18 1304    Visit Number  3    Number of Visits  17    Date for PT Re-Evaluation  05/19/18    Authorization Type  3    Authorization Time Period  of 10 progress report    PT Start Time  1304    PT Stop Time  1345    PT Time Calculation (min)  41 min    Equipment Utilized During Treatment  Gait belt   pt SPC   Activity Tolerance  Patient limited by pain;Patient tolerated treatment well    Behavior During Therapy  Restless;Impulsive;WFL for tasks assessed/performed       Past Medical History:  Diagnosis Date  . Cancer ([place])    Non-hodgkin's lymphoma (in remission)  . Knee joint cyst, left 02/07/2018  . Parkinson disease ([place])   . Parkinson disease [facility])     Past Surgical History:  Procedure Laterality Date  . BACK SURGERY    . BUNIONECTOMY    . NASAL SEPTUM SURGERY    . RETINAL DETACHMENT SURGERY      There were no vitals filed for this visit.  Subjective Assessment - 03/30/18 1304    Subjective  Back is sore. Was cleaning in her bedroom for about 30 minutes. 8/10 back pain currently. Has been doing her HEP. Not sure if they are working.  Getting in and out of the bed hurts. Sleeps on the R side of the bed.     Pertinent History  Chronic back pain. Has had Parkinsons since she was 67 years old. Has had back pain for years. Worsened around 2 years ago and feels cramping as well as pins and needles R medial and lateral thigh as well as L thigh.  Pt states that [name] increased last week. Difficulty bending her L leg and to kneel due to pain. Wears a knee brace to help with the swelling.   Pt states that  when she walks on her L LE and steps on it, it gives way which started around the past 2 days.   Pt states that her back pain has gotten worse since 2 years ago.  Walking makes her legs feel heavy. Pt uses her SPC sporadically when her legs bother her.  Denies loss of bowel or bladder control.  Pt unsure if she has tingling or numness bilateral inner thigh.  Pt also states having Non-Hodgkins lymphoma cancer.   Pt states tinlging and numbness bilateral posterior LE going down to her feet.   Pt also states falling 3 times within the past 6 months.  Pt tripped on a stool, pt also was running and in a hurry, did not watch where she was going and tripped on a basket. Pt also fell off the bed when she was sleeping.     Back pain predominantly R posterior pelvis area.    Patient Stated Goals  Be able to stand up straighter.     Currently in Pain?  Yes    Pain Score  8  PT Education - 03/30/18 1449    Education Details  ther-ex, HEP    Person(s) Educated  Patient    Methods  Explanation;Demonstration;Tactile cues;Verbal cues;Handout    Comprehension  Returned demonstration;Verbalized understanding      Objectives  Pt utilized her rollator walker today.   Therapeutic exercise   Seated (back unsupported)  naval ins 8x 10 seconds. R L5 symptoms Sitting upright posture (back unsupported)  : less R L5 symptoms, increased bilateral hip joint symptoms  STM L lumbar paraspinal muscle x 15 seconds (back unsupported)  . Increased low back symptoms.   Seated manually resisted R trunk side bend isometrics on chair with back supported 5x5 seconds to help decrease L lateral lean for 4 sets   Seated thoracic extension over chair 5x5  Decreased back pain to 3/10    Seated with back supported glute max squeeze 10x5 seconds for 3 sets  Log rolling sit <> S/L <> supine 1x for proper technique.  R S/L to sit: pt felt L knee joint lock up. Felt better  with standing and weight bearing on it. Pt states it happens 2-3 times a day. Does not know what causes it. Pain feels like it is inside the joint. Has a Baker's cyst. Pt saw a doctor about the Baker's cyst. Was given a brace 3 weeks ago by Dr. Harlow Mares March 08, 2018. Ultrasound was negative for a blood clot.   Log rolling again for sit <> supine for R side of bed 1x. No pain. Back felt ok per pt.   Reviewed and given as  Part of her HEP. Pt husband verbalized understanding.   Improved exercise technique, movement at target joints, use of target muscles after max verbal, visual, tactile cues.   Decreased back pain with supine <> sit transfer when performing the log roll technique. Decreased back pain in sitting following treatment to promote trunk muscle activation, decrease L side bending and improve thoracic extension with back supported. Pt demonstrates poor posture, difficulty with trunk and lumbpelvic control, bilateral hip weakness and would benefit from continued skilled physical therapy services to address the aforementioned deficits and to improve ability to perform standing tasks more comfortably.         PT Short Term Goals - 03/24/18 1603      PT SHORT TERM GOAL #1   Title  Patient will be independent with her HEP to promote trunk and LE strength, and to help decrease back and bilateral LE pain.     Baseline  --    Time  3    Period  Weeks    Status  New    Target Date  04/14/18        PT Long Term Goals - 03/24/18 2146      PT LONG TERM GOAL #1   Title  Patient will have a decrease in back pain to 5/10 or less at worst to promote ability to ambulate, peform standing tasks more comfortably.     Baseline  10/10 back pain at worst for the past 3 months. (03/24/2018)    Time  8    Period  Weeks    Status  New    Target Date  05/19/18      PT LONG TERM GOAL #2   Title  Patient will improve her lumbar spine FOTO score by at least 10 points as a demonstration of improved  function.     Baseline  Lumbar spine FOTO: 37 (03/24/2018)  Time  8    Period  Weeks    Status  New    Target Date  05/19/18      PT LONG TERM GOAL #3   Title  Pt will improve bilateral hip abduction and extension strength by at least 1/2 MMT grade to promote ability to perform standing tasks with less back pain.     Time  8    Period  Weeks    Status  New    Target Date  05/19/18            Plan - 03/30/18 1256    Clinical Impression Statement  Decreased back pain with supine <> sit transfer when performing the log roll technique. Decreased back pain in sitting following treatment to promote trunk muscle activation, decrease L side bending and improve thoracic extension with back supported. Pt demonstrates poor posture, difficulty with trunk and lumbpelvic control, bilateral hip weakness and would benefit from continued skilled physical therapy services to address the aforementioned deficits and to improve ability to perform standing tasks more comfortably.     Rehab Potential  Fair    Clinical Impairments Affecting Rehab Potential  (-) Chronicity of back pain, B LE pain, Parkinson's, age; (+) motivated    PT Frequency  2x / week    PT Duration  8 weeks    PT Treatment/Interventions  Therapeutic activities;Therapeutic exercise;Balance training;Neuromuscular re-education;Patient/family education;Manual techniques;Dry needling;Aquatic Therapy;Electrical Stimulation;Iontophoresis 4mg /ml Dexamethasone;Gait training    PT Next Visit Plan  trunk, hip strengthening, lumbopelvic control, gait training, manual techniques, modalities PRN    Consulted and Agree with Plan of Care  Patient;Family member/caregiver    Family Member Consulted  husband       Patient will benefit from skilled therapeutic intervention in order to improve the following deficits and impairments:  Pain, Improper body mechanics, Postural dysfunction, Decreased strength, Difficulty walking, Decreased safety awareness,  Decreased balance, Abnormal gait  Visit Diagnosis: Chronic bilateral low back pain with bilateral sciatica  Radiculopathy, lumbosacral region  Muscle weakness (generalized)  Difficulty in walking, not elsewhere classified     Problem List Patient Active Problem List   Diagnosis Date Noted  . Lumbar spondylosis 03/07/2018  . Chronic pain of left knee 03/07/2018  . Chronic upper back pain 12/08/2017  . Benign neoplasm of hand 10/20/2017  . Chronic pain syndrome 10/20/2017  . Chronic pain of right lower extremity 10/20/2017  . Chronic myofascial pain 10/20/2017  . Numbness and tingling 07/12/2017  . Sleep difficulties 07/12/2017  . Low back pain 01/18/2017  . Sleep behavior disorder, REM 01/18/2017  . Lumbar radiculopathy 11/01/2016  . Follicular lymphoma of extranodal and solid organ sites [facility]) 05/01/2016  . Lymphoma ([place]) 04/30/2016  . Hallux limitus 07/27/2015  . Low back pain with sciatica 02/13/2015  . Bilateral low back pain with sciatica 02/13/2015  . H/O adenomatous polyp of colon 04/06/2014  . Hx of adenomatous colonic polyps 04/06/2014  . Back ache 01/25/2014  . Adiposity 01/25/2014  . REM sleep behavior disorder 01/25/2014  . Disordered sleep 01/25/2014  . Has a tremor 01/25/2014  . Obesity, unspecified 01/25/2014  . Sleep disorder 01/25/2014  . Backache 01/25/2014  . Tremor 01/25/2014  . Difficulty hearing 06/08/2012  . Hearing loss 06/08/2012  . Anxiety 06/06/2012  . Colon polyp 06/06/2012  . Clinical depression 06/06/2012  . Hypercholesteremia 06/06/2012  . Idiopathic Parkinson's disease ([place]) 06/06/2012  . Detached retina 06/06/2012  . Depression 06/06/2012  . Retinal detachment 06/06/2012  . Parkinson's  disease [facility]) 06/06/2012    Joneen Boers PT, DPT   03/30/2018, 3:04 PM  [facility] [address], [place], [address] Phone: [phone]   Fax:  [phone]  Name:  Sarah Donaldson MRN: [id number] Date of Birth: August 10, 1951

## 2018-03-30 NOTE — Patient Instructions (Addendum)
(  Home) Extension: Thoracic With Lumbar Lock - Sitting    Sit with back against chair, knees bent, hands folded across (not shown). Extend trunk over chair back comfortably Repeat ___5  times per set. Do _5___ sets per session daily.   Copyright  VHI. All rights reserved.       Log rolling sit <> supine for R side of bed 1x. No pain. Back felt ok per pt.   Reviewed and given as  Part of her HEP. Pt husband verbalized understanding.

## 2018-04-05 ENCOUNTER — Ambulatory Visit: Payer: Medicare Other

## 2018-04-05 DIAGNOSIS — G8929 Other chronic pain: Secondary | ICD-10-CM

## 2018-04-05 DIAGNOSIS — M5417 Radiculopathy, lumbosacral region: Secondary | ICD-10-CM

## 2018-04-05 DIAGNOSIS — M6281 Muscle weakness (generalized): Secondary | ICD-10-CM

## 2018-04-05 DIAGNOSIS — R262 Difficulty in walking, not elsewhere classified: Secondary | ICD-10-CM

## 2018-04-05 DIAGNOSIS — M5442 Lumbago with sciatica, left side: Principal | ICD-10-CM

## 2018-04-05 DIAGNOSIS — M5441 Lumbago with sciatica, right side: Principal | ICD-10-CM

## 2018-04-05 NOTE — Therapy (Signed)
[facility] [address], [place], [address] Phone: [phone]   Fax:  [phone]  Physical Therapy Treatment  Patient Details  Name: Sarah Donaldson MRN: [id number] Date of Birth: Jul 10, 1951 Referring Provider: Gurney Maxin, MD   Encounter Date: 04/05/2018  PT End of Session - 04/05/18 1354    Visit Number  4    Number of Visits  17    Date for PT Re-Evaluation  05/19/18    Authorization Type  4    Authorization Time Period  of 10 progress report    PT Start Time  6433   pt arrived late   PT Stop Time  1427    PT Time Calculation (min)  33 min    Equipment Utilized During Treatment  Gait belt   pt SPC   Activity Tolerance  Patient limited by pain;Patient tolerated treatment well    Behavior During Therapy  Restless;Impulsive;WFL for tasks assessed/performed       Past Medical History:  Diagnosis Date  . Cancer ([place])    Non-hodgkin's lymphoma (in remission)  . Knee joint cyst, left 02/07/2018  . Parkinson disease ([place])   . Parkinson disease [name])     Past Surgical History:  Procedure Laterality Date  . BACK SURGERY    . BUNIONECTOMY    . NASAL SEPTUM SURGERY    . RETINAL DETACHMENT SURGERY      There were no vitals filed for this visit.  Subjective Assessment - 04/05/18 1356    Subjective  L knee is bothering her. Feels like she woke up the wrong way. Back was sore this morning. 6/10 back pain currently. Back was sore after last session.     Pertinent History  Chronic back pain. Has had Parkinsons since she was 67 years old. Has had back pain for years. Worsened around 2 years ago and feels cramping as well as pins and needles R medial and lateral thigh as well as L thigh.  Pt states that [name] increased last week. Difficulty bending her L leg and to kneel due to pain. Wears a knee brace to help with the swelling.   Pt states that when she walks on her L LE and steps on it, it  gives way which started around the past 2 days.   Pt states that her back pain has gotten worse since 2 years ago.  Walking makes her legs feel heavy. Pt uses her SPC sporadically when her legs bother her.  Denies loss of bowel or bladder control.  Pt unsure if she has tingling or numness bilateral inner thigh.  Pt also states having Non-Hodgkins lymphoma cancer.   Pt states tinlging and numbness bilateral posterior LE going down to her feet.   Pt also states falling 3 times within the past 6 months.  Pt tripped on a stool, pt also was running and in a hurry, did not watch where she was going and tripped on a basket. Pt also fell off the bed when she was sleeping.     Back pain predominantly R posterior pelvis area.    Patient Stated Goals  Be able to stand up straighter.     Currently in Pain?  Yes    Pain Score  6     Pain Location  Back  PT Education - 04/05/18 1404    Education Details  ther-ex    Person(s) Educated  Patient    Methods  Explanation;Demonstration;Tactile cues;Verbal cues    Comprehension  Returned demonstration;Verbalized understanding        Objectives  Pt utilized her rollator walker today.   Gait: pt uses rollator with L lateral lean. Possible stress to L knee.   Therapeutic exercise   Sitting on regular chair:   Seated manually resisted R trunk side bend isometrics on chair with back supported 10x5 seconds to help decrease L lateral lean for 4 sets   Seated thoracic extension over chair 10x5 seconds for 3 sets to promote thoracic extension and decrease low back extension pressure. Decreased R LE tingling with feet propped on Air ex pad to decrease lumbar extension pressure  Seated bilateral shoulder extension isometrics, hands on thighs to promote trunk muscle activation 10x5 seconds for 3 sets   Seated straight pallof press resisting yellow band 10x5 seconds for 3 sets to promote trunk muscle  activation.   Sitting up straight unsupported 20 seconds for 5 sets. Less back pain when correcting L lateral shift.      Improved exercise technique, movement at target joints, use of target muscles after mod verbal, visual, tactile cues.    Decreased low back pain in sitting with correcting L lateral shift and decreasing low back extension pressure. Pt also demonstrates difficulty with lumbopelvic control. Pt will benefit from continued skilled physical therapy services to improve trunk and LE strength, posture, and improve function.            PT Short Term Goals - 03/24/18 1603      PT SHORT TERM GOAL #1   Title  Patient will be independent with her HEP to promote trunk and LE strength, and to help decrease back and bilateral LE pain.     Baseline  --    Time  3    Period  Weeks    Status  New    Target Date  04/14/18        PT Long Term Goals - 03/24/18 2146      PT LONG TERM GOAL #1   Title  Patient will have a decrease in back pain to 5/10 or less at worst to promote ability to ambulate, peform standing tasks more comfortably.     Baseline  10/10 back pain at worst for the past 3 months. (03/24/2018)    Time  8    Period  Weeks    Status  New    Target Date  05/19/18      PT LONG TERM GOAL #2   Title  Patient will improve her lumbar spine FOTO score by at least 10 points as a demonstration of improved function.     Baseline  Lumbar spine FOTO: 37 (03/24/2018)    Time  8    Period  Weeks    Status  New    Target Date  05/19/18      PT LONG TERM GOAL #3   Title  Pt will improve bilateral hip abduction and extension strength by at least 1/2 MMT grade to promote ability to perform standing tasks with less back pain.     Time  8    Period  Weeks    Status  New    Target Date  05/19/18            Plan - 04/05/18 1353    Clinical Impression  Statement  Decreased low back pain in sitting with correcting L lateral shift and decreasing low back extension  pressure. Pt also demonstrates difficulty with lumbopelvic control. Pt will benefit from continued skilled physical therapy services to improve trunk and LE strength, posture, and improve function.     Rehab Potential  Fair    Clinical Impairments Affecting Rehab Potential  (-) Chronicity of back pain, B LE pain, Parkinson's, age; (+) motivated    PT Frequency  2x / week    PT Duration  8 weeks    PT Treatment/Interventions  Therapeutic activities;Therapeutic exercise;Balance training;Neuromuscular re-education;Patient/family education;Manual techniques;Dry needling;Aquatic Therapy;Electrical Stimulation;Iontophoresis 4mg /ml Dexamethasone;Gait training    PT Next Visit Plan  trunk, hip strengthening, lumbopelvic control, gait training, manual techniques, modalities PRN    Consulted and Agree with Plan of Care  Patient;Family member/caregiver    Family Member Consulted  husband       Patient will benefit from skilled therapeutic intervention in order to improve the following deficits and impairments:  Pain, Improper body mechanics, Postural dysfunction, Decreased strength, Difficulty walking, Decreased safety awareness, Decreased balance, Abnormal gait  Visit Diagnosis: Chronic bilateral low back pain with bilateral sciatica  Radiculopathy, lumbosacral region  Muscle weakness (generalized)  Difficulty in walking, not elsewhere classified     Problem List Patient Active Problem List   Diagnosis Date Noted  . Lumbar spondylosis 03/07/2018  . Chronic pain of left knee 03/07/2018  . Chronic upper back pain 12/08/2017  . Benign neoplasm of hand 10/20/2017  . Chronic pain syndrome 10/20/2017  . Chronic pain of right lower extremity 10/20/2017  . Chronic myofascial pain 10/20/2017  . Numbness and tingling 07/12/2017  . Sleep difficulties 07/12/2017  . Low back pain 01/18/2017  . Sleep behavior disorder, REM 01/18/2017  . Lumbar radiculopathy 11/01/2016  . Follicular lymphoma of  extranodal and solid organ sites [facility]) 05/01/2016  . Lymphoma ([place]) 04/30/2016  . Hallux limitus 07/27/2015  . Low back pain with sciatica 02/13/2015  . Bilateral low back pain with sciatica 02/13/2015  . H/O adenomatous polyp of colon 04/06/2014  . Hx of adenomatous colonic polyps 04/06/2014  . Back ache 01/25/2014  . Adiposity 01/25/2014  . REM sleep behavior disorder 01/25/2014  . Disordered sleep 01/25/2014  . Has a tremor 01/25/2014  . Obesity, unspecified 01/25/2014  . Sleep disorder 01/25/2014  . Backache 01/25/2014  . Tremor 01/25/2014  . Difficulty hearing 06/08/2012  . Hearing loss 06/08/2012  . Anxiety 06/06/2012  . Colon polyp 06/06/2012  . Clinical depression 06/06/2012  . Hypercholesteremia 06/06/2012  . Idiopathic Parkinson's disease ([place]) 06/06/2012  . Detached retina 06/06/2012  . Depression 06/06/2012  . Retinal detachment 06/06/2012  . Parkinson's disease ([place]) 06/06/2012    Joneen Boers PT, DPT   04/05/2018, 7:34 PM  [facility] [address] [place], [address] Phone: [phone]   Fax:  [phone]  Name: Sarah Donaldson MRN: [id number] Date of Birth: 09/17/1950

## 2018-04-13 ENCOUNTER — Ambulatory Visit: Payer: Medicare Other | Attending: Nurse Practitioner

## 2018-04-13 DIAGNOSIS — M5442 Lumbago with sciatica, left side: Secondary | ICD-10-CM | POA: Diagnosis not present

## 2018-04-13 DIAGNOSIS — M6281 Muscle weakness (generalized): Secondary | ICD-10-CM | POA: Insufficient documentation

## 2018-04-13 DIAGNOSIS — R262 Difficulty in walking, not elsewhere classified: Secondary | ICD-10-CM | POA: Diagnosis not present

## 2018-04-13 DIAGNOSIS — G8929 Other chronic pain: Secondary | ICD-10-CM | POA: Insufficient documentation

## 2018-04-13 DIAGNOSIS — M5441 Lumbago with sciatica, right side: Secondary | ICD-10-CM | POA: Diagnosis not present

## 2018-04-13 DIAGNOSIS — M5417 Radiculopathy, lumbosacral region: Secondary | ICD-10-CM | POA: Diagnosis not present

## 2018-04-13 NOTE — Therapy (Signed)
[facility] [address], [place], [address] Phone: [phone]   Fax:  [phone]  Physical Therapy Treatment  Patient Details  Name: Sarah Donaldson MRN: [id number] Date of Birth: 1951-03-04 Referring Provider: Gurney Maxin, MD   Encounter Date: 04/13/2018  PT End of Session - 04/13/18 1304    Visit Number  5    Number of Visits  17    Date for PT Re-Evaluation  05/19/18    Authorization Type  5    Authorization Time Period  of 10 progress report    PT Start Time  1304    PT Stop Time  1345    PT Time Calculation (min)  41 min    Equipment Utilized During Treatment  Gait belt   pt SPC   Activity Tolerance  Patient limited by pain;Patient tolerated treatment well    Behavior During Therapy  Restless;Impulsive;WFL for tasks assessed/performed       Past Medical History:  Diagnosis Date  . Cancer ([place])    Non-hodgkin's lymphoma (in remission)  . Knee joint cyst, left 02/07/2018  . Parkinson disease ([place])   . Parkinson disease [facility])     Past Surgical History:  Procedure Laterality Date  . BACK SURGERY    . BUNIONECTOMY    . NASAL SEPTUM SURGERY    . RETINAL DETACHMENT SURGERY      There were no vitals filed for this visit.  Subjective Assessment - 04/13/18 1306    Subjective  Pt states that she sat on her husband's chair with wheels yesterday and fell onto her rear end. Back hurts currently. 9/10 back pain currently.     Pertinent History  Chronic back pain. Has had Parkinsons since she was 67 years old. Has had back pain for years. Worsened around 2 years ago and feels cramping as well as pins and needles R medial and lateral thigh as well as L thigh.  Pt states that [name] increased last week. Difficulty bending her L leg and to kneel due to pain. Wears a knee brace to help with the swelling.   Pt states that when she walks on her L LE and steps on it, it gives way which started  around the past 2 days.   Pt states that her back pain has gotten worse since 2 years ago.  Walking makes her legs feel heavy. Pt uses her SPC sporadically when her legs bother her.  Denies loss of bowel or bladder control.  Pt unsure if she has tingling or numness bilateral inner thigh.  Pt also states having Non-Hodgkins lymphoma cancer.   Pt states tinlging and numbness bilateral posterior LE going down to her feet.   Pt also states falling 3 times within the past 6 months.  Pt tripped on a stool, pt also was running and in a hurry, did not watch where she was going and tripped on a basket. Pt also fell off the bed when she was sleeping.     Back pain predominantly R posterior pelvis area.    Patient Stated Goals  Be able to stand up straighter.     Currently in Pain?  Yes    Pain Score  9                                PT Education - 04/13/18 2012    Education Details  ther-ex    Person(s) Educated  Patient    Methods  Explanation;Demonstration;Tactile cues;Verbal cues    Comprehension  Returned demonstration;Verbalized understanding         Objectives  Hx of back surgery with screws November 2018.  Next appt with Dr. Holley Raring is around June 02, 2018.   Impulsive (observation)  Pt utilized her rollator walker today.  Gait: pt uses rollator with L lateral lean. Possible stress to L knee.   Therapeutic exercise   Pt was recommended to hold off from sitting onto surfaces with wheels for safety. Pt verbalized understanding.   Sitting on regular chair:   Seated manually resisted R trunk side bend isometrics on chairwith back supported10x5 seconds to help decrease L lateral lean for 4 sets   Seated manually resisted trunk flexion isometrics with PT 10x5 seconds for 3 sets   Seated bilateral scapular retraction 10x5 seconds for 3 sets to promote thoracic extension and decrease low back extension   Pt observed to stand up and walk without AD  unsteadily.   Seated hip adduction ball squeeze 10x5 seconds for 2 sets  Seated straight pallof press resisting yellow band 8x5 seconds. R thigh pins and needles today   Decreased with L lateral shift correnction  Sitting with L lateral shift correction.   Seated manually resisted R trunk rotation isometrics to promote better posture 10x5 seconds   Decreased back pain   Pt was recommended that due to her recent fall, it might be a good idea to have her doctor check out her back if her pain worsens due to the doctor being able to order imaging as well. Pt and husband verbalized understanding.    Improved exercise technique, movement at target joints, use of target muscles after min to mod verbal, visual, tactile cues.   Decreased back pain following treatment to decrease L lateral shift and L trunk rotation. Continued working on trunk strengthening to help decrease stress to low back. Pt will benefit from continued skilled physical therapy services to decrease back pain, improve strength and function.     PT Short Term Goals - 03/24/18 1603      PT SHORT TERM GOAL #1   Title  Patient will be independent with her HEP to promote trunk and LE strength, and to help decrease back and bilateral LE pain.     Baseline  --    Time  3    Period  Weeks    Status  New    Target Date  04/14/18        PT Long Term Goals - 03/24/18 2146      PT LONG TERM GOAL #1   Title  Patient will have a decrease in back pain to 5/10 or less at worst to promote ability to ambulate, peform standing tasks more comfortably.     Baseline  10/10 back pain at worst for the past 3 months. (03/24/2018)    Time  8    Period  Weeks    Status  New    Target Date  05/19/18      PT LONG TERM GOAL #2   Title  Patient will improve her lumbar spine FOTO score by at least 10 points as a demonstration of improved function.     Baseline  Lumbar spine FOTO: 37 (03/24/2018)    Time  8    Period  Weeks    Status  New     Target Date  05/19/18  PT LONG TERM GOAL #3   Title  Pt will improve bilateral hip abduction and extension strength by at least 1/2 MMT grade to promote ability to perform standing tasks with less back pain.     Time  8    Period  Weeks    Status  New    Target Date  05/19/18            Plan - 04/13/18 1303    Clinical Impression Statement  Decreased back pain following treatment to decrease L lateral shift and L trunk rotation. Continued working on trunk strengthening to help decrease stress to low back. Pt will benefit from continued skilled physical therapy services to decrease back pain, improve strength and function.     Rehab Potential  Fair    Clinical Impairments Affecting Rehab Potential  (-) Chronicity of back pain, B LE pain, Parkinson's, age; (+) motivated    PT Frequency  2x / week    PT Duration  8 weeks    PT Treatment/Interventions  Therapeutic activities;Therapeutic exercise;Balance training;Neuromuscular re-education;Patient/family education;Manual techniques;Dry needling;Aquatic Therapy;Electrical Stimulation;Iontophoresis 4mg /ml Dexamethasone;Gait training    PT Next Visit Plan  trunk, hip strengthening, lumbopelvic control, gait training, manual techniques, modalities PRN    Consulted and Agree with Plan of Care  Patient;Family member/caregiver    Family Member Consulted  husband       Patient will benefit from skilled therapeutic intervention in order to improve the following deficits and impairments:  Pain, Improper body mechanics, Postural dysfunction, Decreased strength, Difficulty walking, Decreased safety awareness, Decreased balance, Abnormal gait  Visit Diagnosis: Chronic bilateral low back pain with bilateral sciatica  Radiculopathy, lumbosacral region  Muscle weakness (generalized)  Difficulty in walking, not elsewhere classified     Problem List Patient Active Problem List   Diagnosis Date Noted  . Lumbar spondylosis 03/07/2018  .  Chronic pain of left knee 03/07/2018  . Chronic upper back pain 12/08/2017  . Benign neoplasm of hand 10/20/2017  . Chronic pain syndrome 10/20/2017  . Chronic pain of right lower extremity 10/20/2017  . Chronic myofascial pain 10/20/2017  . Numbness and tingling 07/12/2017  . Sleep difficulties 07/12/2017  . Low back pain 01/18/2017  . Sleep behavior disorder, REM 01/18/2017  . Lumbar radiculopathy 11/01/2016  . Follicular lymphoma of extranodal and solid organ sites [facility]) 05/01/2016  . Lymphoma ([place]) 04/30/2016  . Hallux limitus 07/27/2015  . Low back pain with sciatica 02/13/2015  . Bilateral low back pain with sciatica 02/13/2015  . H/O adenomatous polyp of colon 04/06/2014  . Hx of adenomatous colonic polyps 04/06/2014  . Back ache 01/25/2014  . Adiposity 01/25/2014  . REM sleep behavior disorder 01/25/2014  . Disordered sleep 01/25/2014  . Has a tremor 01/25/2014  . Obesity, unspecified 01/25/2014  . Sleep disorder 01/25/2014  . Backache 01/25/2014  . Tremor 01/25/2014  . Difficulty hearing 06/08/2012  . Hearing loss 06/08/2012  . Anxiety 06/06/2012  . Colon polyp 06/06/2012  . Clinical depression 06/06/2012  . Hypercholesteremia 06/06/2012  . Idiopathic Parkinson's disease ([place]) 06/06/2012  . Detached retina 06/06/2012  . Depression 06/06/2012  . Retinal detachment 06/06/2012  . Parkinson's disease ([place]) 06/06/2012    Joneen Boers PT, DPT  04/13/2018, 8:20 PM  [place] [facility] [address], [place], [address] Phone: [phone]   Fax:  [phone]  Name: Sarah Donaldson MRN: [id number] Date of Birth: 1951/07/15

## 2018-04-19 ENCOUNTER — Emergency Department
Admission: EM | Admit: 2018-04-19 | Discharge: 2018-04-19 | Disposition: A | Discharge status: Home / Self Care | Payer: Medicare Other | Attending: Emergency Medicine | Admitting: Emergency Medicine

## 2018-04-19 ENCOUNTER — Emergency Department: Payer: Medicare Other

## 2018-04-19 ENCOUNTER — Other Ambulatory Visit: Payer: Self-pay

## 2018-04-19 DIAGNOSIS — Z79899 Other long term (current) drug therapy: Secondary | ICD-10-CM | POA: Diagnosis not present

## 2018-04-19 DIAGNOSIS — F419 Anxiety disorder, unspecified: Secondary | ICD-10-CM | POA: Insufficient documentation

## 2018-04-19 DIAGNOSIS — R220 Localized swelling, mass and lump, head: Secondary | ICD-10-CM | POA: Diagnosis present

## 2018-04-19 DIAGNOSIS — T783XXA Angioneurotic edema, initial encounter: Secondary | ICD-10-CM | POA: Diagnosis not present

## 2018-04-19 DIAGNOSIS — F329 Major depressive disorder, single episode, unspecified: Secondary | ICD-10-CM | POA: Diagnosis not present

## 2018-04-19 DIAGNOSIS — G20 Parkinson's disease: Secondary | ICD-10-CM | POA: Diagnosis not present

## 2018-04-19 DIAGNOSIS — M5441 Lumbago with sciatica, right side: Secondary | ICD-10-CM | POA: Diagnosis not present

## 2018-04-19 DIAGNOSIS — S3993XA Unspecified injury of pelvis, initial encounter: Secondary | ICD-10-CM | POA: Diagnosis not present

## 2018-04-19 DIAGNOSIS — S3992XA Unspecified injury of lower back, initial encounter: Secondary | ICD-10-CM | POA: Diagnosis not present

## 2018-04-19 DIAGNOSIS — Z87891 Personal history of nicotine dependence: Secondary | ICD-10-CM | POA: Insufficient documentation

## 2018-04-19 DIAGNOSIS — Z8572 Personal history of non-Hodgkin lymphomas: Secondary | ICD-10-CM | POA: Diagnosis not present

## 2018-04-19 MED ORDER — NAPROXEN 500 MG PO TABS
500.0000 mg | ORAL_TABLET | Freq: Once | ORAL | Status: AC
  Administered 2018-04-19: 500 mg via ORAL
  Filled 2018-04-19: qty 1

## 2018-04-19 NOTE — Discharge Instructions (Addendum)
Please speak with Dr. Melrose Nakayama, unclear if your new medication may have caused a reaction/swelling in your face, it may be wise to discontinue if possible

## 2018-04-19 NOTE — ED Provider Notes (Signed)
[facility] Emergency Department Provider Note   ____________________________________________    I have reviewed the triage vital signs and the nursing notes.   HISTORY  Chief Complaint Numbness; Back Pain; and Facial Swelling     HPI Sarah Donaldson is a 67 y.o. adult who presents with multiple complaints.  Patient reports last night she had swelling of her lips which has now resolved.  She has been taking extended release carbidopa over the last week but has tolerated it well over the last week.  States she feels well now, no throat swelling, no shortness of breath.  Also complains that she had a fall 1 week ago and injured her lower back.  Has some sciatica-like pain radiating down her right leg.    Past Medical History:  Diagnosis Date  . Cancer ([place])    Non-hodgkin's lymphoma (in remission)  . Knee joint cyst, left 02/07/2018  . Parkinson disease ([place])   . Parkinson disease [facility])     Patient Active Problem List   Diagnosis Date Noted  . Lumbar spondylosis 03/07/2018  . Chronic pain of left knee 03/07/2018  . Chronic upper back pain 12/08/2017  . Benign neoplasm of hand 10/20/2017  . Chronic pain syndrome 10/20/2017  . Chronic pain of right lower extremity 10/20/2017  . Chronic myofascial pain 10/20/2017  . Numbness and tingling 07/12/2017  . Sleep difficulties 07/12/2017  . Low back pain 01/18/2017  . Sleep behavior disorder, REM 01/18/2017  . Lumbar radiculopathy 11/01/2016  . Follicular lymphoma of extranodal and solid organ sites [facility]) 05/01/2016  . Lymphoma ([place]) 04/30/2016  . Hallux limitus 07/27/2015  . Low back pain with sciatica 02/13/2015  . Bilateral low back pain with sciatica 02/13/2015  . H/O adenomatous polyp of colon 04/06/2014  . Hx of adenomatous colonic polyps 04/06/2014  . Back ache 01/25/2014  . Adiposity 01/25/2014  . REM sleep behavior disorder 01/25/2014  . Disordered sleep 01/25/2014  . Has a tremor  01/25/2014  . Obesity, unspecified 01/25/2014  . Sleep disorder 01/25/2014  . Backache 01/25/2014  . Tremor 01/25/2014  . Difficulty hearing 06/08/2012  . Hearing loss 06/08/2012  . Anxiety 06/06/2012  . Colon polyp 06/06/2012  . Clinical depression 06/06/2012  . Hypercholesteremia 06/06/2012  . Idiopathic Parkinson's disease ([place]) 06/06/2012  . Detached retina 06/06/2012  . Depression 06/06/2012  . Retinal detachment 06/06/2012  . Parkinson's disease ([place]) 06/06/2012    Past Surgical History:  Procedure Laterality Date  . BACK SURGERY    . BUNIONECTOMY    . NASAL SEPTUM SURGERY    . RETINAL DETACHMENT SURGERY      Prior to Admission medications   Medication Sig Start Date End Date Taking? Authorizing Provider  Acetaminophen (TYLENOL ARTHRITIS PAIN PO) Take by mouth 2 (two) times daily.    [provider]  carbidopa-levodopa (SINEMET IR) 25-100 MG per tablet TAKE 2 tablets in a.m. and 1 tab BY MOUTH every 4 hrs 04/17/15   [provider]  clonazePAM (KLONOPIN) 1 MG tablet Take 2 mg by mouth daily.  03/15/14   [provider]  furosemide (LASIX) 40 MG tablet Take 20 mg by mouth as needed for fluid. Twice a week    [provider]  naproxen (EC NAPROSYN) 500 MG EC tablet Take 1 tablet (500 mg total) by mouth 2 (two) times daily with a meal. 11/02/17 11/02/18  Vevelyn Francois, NP  neomycin-polymyxin b-dexamethasone (MAXITROL) 3.5-10000-0.1 OINT Place 1 application into the left  eye at bedtime. 10/07/17   [provider]  omeprazole (PRILOSEC) 20 MG capsule Take 1 capsule (20 mg total) by mouth daily. 03/07/18 06/05/18  Vevelyn Francois, NP  pramipexole (MIRAPEX) 1.5 MG tablet Take 1.5 mg by mouth 3 (three) times daily. 08/06/14   [provider]  prednisoLONE acetate (PRED FORTE) 1 % ophthalmic suspension Place 1 drop into the left eye daily. 10/07/17   [provider]  pregabalin (LYRICA) 25 MG capsule Take 1 capsule (25 mg  total) by mouth 4 (four) times daily. 03/07/18 06/05/18  Vevelyn Francois, NP  promethazine (PHENERGAN) 12.5 MG tablet Take 12.5 mg by mouth every 6 (six) hours as needed for nausea or vomiting.    [provider]  ranitidine (ZANTAC) 150 MG capsule Take 150 mg by mouth every evening.    [provider]  selegiline (ELDEPRYL) 5 MG capsule TAKE 1 CAPSULE BY MOUTH WITH BREAKFAST AND LUNCH. 04/24/15   [provider]  traZODone (DESYREL) 50 MG tablet TAKE 1-2 TABLETS NIGHTLY AS NEEDED FOR SLEEP. 08/11/15   [provider]     Allergies Codeine sulfate and Gabapentin  Family History  Problem Relation Age of Onset  . Heart disease Mother     Social History Social History   Tobacco Use  . Smoking status: Former Research scientist (life sciences)  . Smokeless tobacco: Never Used  Substance Use Topics  . Alcohol use: Yes    Alcohol/week: 0.0 standard drinks  . Drug use: No    Review of Systems  Constitutional: No fever/chills Eyes: No visual changes.  ENT: No throat swelling Cardiovascular: Denies chest pain. Respiratory: Denies shortness of breath. Gastrointestinal: No abdominal pain.   Genitourinary: Negative for dysuria. Musculoskeletal: As above Skin: Negative for rash. Neurological: Negative for headaches   ____________________________________________   PHYSICAL EXAM:  VITAL SIGNS: ED Triage Vitals  Enc Vitals Group     BP 04/19/18 0710 (!) 148/94     Pulse Rate 04/19/18 0710 93     Resp 04/19/18 0710 18     Temp 04/19/18 0710 98.6 F (37 C)     Temp Source 04/19/18 0710 Oral     SpO2 04/19/18 0710 98 %     Weight 04/19/18 0713 74.8 kg (165 lb)     Height 04/19/18 0713 1.676 m (5\' 6" )     Head Circumference --      Peak Flow --      Pain Score --      Pain Loc --      Pain Edu? --      Excl. in [place]? --     Constitutional: Alert and oriented. No acute distress.  Eyes: Conjunctivae are normal.   Nose: No congestion/rhinnorhea. Mouth/Throat: Mucous  membranes are moist.  No stridor, no intraoral swelling Cardiovascular: Normal rate, regular rhythm. Grossly normal heart sounds.  Good peripheral circulation. Respiratory: Normal respiratory effort.  No retractions.  Gastrointestinal: Soft and nontender. No distention.    Musculoskeletal: Back: No vertebral tenderness palpation, normal range of motion of the lower extreme knees, normal strength, no pain with axial load.  No lower extremity tenderness nor edema.   Neurologic:  Normal speech and language. No gross focal neurologic deficits are appreciated.  Skin:  Skin is warm, dry and intact. No rash noted. Psychiatric: Mood and affect are normal. Speech and behavior are normal.  ____________________________________________   LABS (all labs ordered are listed, but only abnormal results are displayed)  Labs Reviewed - No data to  display ____________________________________________  EKG   ____________________________________________  RADIOLOGY  Lumbar spine and pelvis x-ray negative for fractures ____________________________________________   PROCEDURES  Procedure(s) performed: No  Procedures   Critical Care performed: No ____________________________________________   INITIAL IMPRESSION / ASSESSMENT AND PLAN / ED COURSE  Pertinent labs & imaging results that were available during my care of the patient were reviewed by me and considered in my medical decision making (see chart for details).  Patient presents with complaints as above, facial swelling has completely resolved.  No intraoral swelling.  Normal strength in the lower extremities.  Some mild tenderness in the right lower paraspinal area.  X-rays negative for fractures.  Given naproxen and states she feels much better.  Recommend supportive care, outpatient follow-up, discussed with Dr. Melrose Nakayama whether she needs to switch back to short acting Parkinson's medications     ____________________________________________   FINAL CLINICAL IMPRESSION(S) / ED DIAGNOSES  Final diagnoses:  None        Note:  This document was prepared using Dragon voice recognition software and may include unintentional dictation errors.    Lavonia Drafts, MD 04/19/18 [phone]

## 2018-04-19 NOTE — ED Triage Notes (Signed)
Pt arrived via POV with husband with reports of swelling in mouth that started after she took medicine last night-an extended release carbidopa-levodopa.   Pt also reports numbness and tingling in both feet that started around 1800-1900 last night. Pt states she has had hx if intermittent numbness due to Parkinson's disease.

## 2018-04-20 ENCOUNTER — Ambulatory Visit: Payer: Medicare Other

## 2018-04-20 DIAGNOSIS — M6281 Muscle weakness (generalized): Secondary | ICD-10-CM

## 2018-04-20 DIAGNOSIS — M5417 Radiculopathy, lumbosacral region: Secondary | ICD-10-CM

## 2018-04-20 DIAGNOSIS — M5442 Lumbago with sciatica, left side: Secondary | ICD-10-CM | POA: Diagnosis not present

## 2018-04-20 DIAGNOSIS — R262 Difficulty in walking, not elsewhere classified: Secondary | ICD-10-CM

## 2018-04-20 DIAGNOSIS — M5441 Lumbago with sciatica, right side: Principal | ICD-10-CM

## 2018-04-20 DIAGNOSIS — G8929 Other chronic pain: Secondary | ICD-10-CM | POA: Diagnosis not present

## 2018-04-20 NOTE — Therapy (Signed)
[facility] [address] [place], [address] Phone: [phone]   Fax:  [phone]  Physical Therapy Treatment  Patient Details  Name: Sarah Donaldson MRN: [id number] Date of Birth: 1951/07/24 Referring Provider: Gurney Maxin, MD   Encounter Date: 04/20/2018  PT End of Session - 04/20/18 1353    Visit Number  6    Number of Visits  17    Date for PT Re-Evaluation  05/19/18    Authorization Type  6    Authorization Time Period  of 10 progress report    PT Start Time  6010    PT Stop Time  1427    PT Time Calculation (min)  34 min    Equipment Utilized During Treatment  Gait belt   pt rollator   Activity Tolerance  Patient limited by pain;Patient tolerated treatment well    Behavior During Therapy  Restless;Impulsive;WFL for tasks assessed/performed       Past Medical History:  Diagnosis Date  . Cancer ([place])    Non-hodgkin's lymphoma (in remission)  . Knee joint cyst, left 02/07/2018  . Parkinson disease ([place])   . Parkinson disease [facility])     Past Surgical History:  Procedure Laterality Date  . BACK SURGERY    . BUNIONECTOMY    . NASAL SEPTUM SURGERY    . RETINAL DETACHMENT SURGERY      There were no vitals filed for this visit.  Subjective Assessment - 04/20/18 1353    Subjective  Pt went to the ER yesterday and thought that she is having a stroke. Went off meloxicam and other medications and feels better.  Staying on the the regular Parkinson's medication.  Back is still sore. The worst part is at night. Still tries to do her exercises even though she is sore.     Pertinent History  Chronic back pain. Has had Parkinsons since she was 67 years old. Has had back pain for years. Worsened around 2 years ago and feels cramping as well as pins and needles R medial and lateral thigh as well as L thigh.  Pt states that [name] increased last week. Difficulty bending her L leg and to kneel due  to pain. Wears a knee brace to help with the swelling.   Pt states that when she walks on her L LE and steps on it, it gives way which started around the past 2 days.   Pt states that her back pain has gotten worse since 2 years ago.  Walking makes her legs feel heavy. Pt uses her SPC sporadically when her legs bother her.  Denies loss of bowel or bladder control.  Pt unsure if she has tingling or numness bilateral inner thigh.  Pt also states having Non-Hodgkins lymphoma cancer.   Pt states tinlging and numbness bilateral posterior LE going down to her feet.   Pt also states falling 3 times within the past 6 months.  Pt tripped on a stool, pt also was running and in a hurry, did not watch where she was going and tripped on a basket. Pt also fell off the bed when she was sleeping.     Back pain predominantly R posterior pelvis area.    Patient Stated Goals  Be able to stand up straighter.     Currently in Pain?  Yes    Pain Score  5    4-5/10 soreness low back  PT Education - 04/20/18 1401    Education Details  ther-ex    Person(s) Educated  Patient    Methods  Explanation;Demonstration;Tactile cues;Verbal cues    Comprehension  Returned demonstration;Verbalized understanding      Objectives  Hx of back surgery with screws November 2018.  Next appt with Dr. Holley Raring is around June 02, 2018.   Impulsive (observation)  Pt utilized her rollator walker today.  Gait: pt uses rollator with L lateral lean.Possible stress to L knee.  Therapeutic exercise    Sitting on regular chair:  Seated manually resisted R trunk side bend isometrics on chairwith back supported10x5 seconds, then 10x10 seconds  to help decrease L lateral lean for 2 sets   Rest break taken for pt to take her medications  Seated manually resisted trunk flexion isometrics with PT 10x5 seconds for 3 sets   Seated manually resisted R trunk rotation  isometrics to promote better posture 10x5 seconds for 2 sets  Sit <> stand from elevated mat table 24 inches high 5x2  Then with glute max squeeze 5x. Possible decreased low back soreness with addition of glute muscle activation but unclear.   Seated bilateral scapular retraction 10x5 seconds for 3 sets   Improved exercise technique, movement at target joints, use of target muscles after mod verbal, visual, tactile cues.    Consistent cues needed to decrease L lateral shift/lean as well as to decrease backward weight shift and to evenly distribute her weight onto her feet instead of her heels with sit to stand to decrease low back pressure. Continued working on trunk strengthening, posture, thoracic extension, LE strength to help decrease back pain and improve ability to perform functional tasks                PT Short Term Goals - 03/24/18 1603      PT SHORT TERM GOAL #1   Title  Patient will be independent with her HEP to promote trunk and LE strength, and to help decrease back and bilateral LE pain.     Baseline  --    Time  3    Period  Weeks    Status  New    Target Date  04/14/18        PT Long Term Goals - 03/24/18 2146      PT LONG TERM GOAL #1   Title  Patient will have a decrease in back pain to 5/10 or less at worst to promote ability to ambulate, peform standing tasks more comfortably.     Baseline  10/10 back pain at worst for the past 3 months. (03/24/2018)    Time  8    Period  Weeks    Status  New    Target Date  05/19/18      PT LONG TERM GOAL #2   Title  Patient will improve her lumbar spine FOTO score by at least 10 points as a demonstration of improved function.     Baseline  Lumbar spine FOTO: 37 (03/24/2018)    Time  8    Period  Weeks    Status  New    Target Date  05/19/18      PT LONG TERM GOAL #3   Title  Pt will improve bilateral hip abduction and extension strength by at least 1/2 MMT grade to promote ability to perform standing tasks with  less back pain.     Time  8    Period  Weeks  Status  New    Target Date  05/19/18            Plan - 04/20/18 1401    Clinical Impression Statement  Consistent cues needed to decrease L lateral shift/lean as well as to decrease backward weight shift and to evenly distribute her weight onto her feet instead of her heels with sit to stand to decrease low back pressure. Continued working on trunk strengthening, posture, thoracic extension, LE strength to help decrease back pain and improve ability to perform functional tasks     Rehab Potential  Fair    Clinical Impairments Affecting Rehab Potential  (-) Chronicity of back pain, B LE pain, Parkinson's, age; (+) motivated    PT Frequency  2x / week    PT Duration  8 weeks    PT Treatment/Interventions  Therapeutic activities;Therapeutic exercise;Balance training;Neuromuscular re-education;Patient/family education;Manual techniques;Dry needling;Aquatic Therapy;Electrical Stimulation;Iontophoresis 4mg /ml Dexamethasone;Gait training    PT Next Visit Plan  trunk, hip strengthening, lumbopelvic control, gait training, manual techniques, modalities PRN    Consulted and Agree with Plan of Care  Patient;Family member/caregiver    Family Member Consulted  husband       Patient will benefit from skilled therapeutic intervention in order to improve the following deficits and impairments:  Pain, Improper body mechanics, Postural dysfunction, Decreased strength, Difficulty walking, Decreased safety awareness, Decreased balance, Abnormal gait  Visit Diagnosis: Chronic bilateral low back pain with bilateral sciatica  Radiculopathy, lumbosacral region  Muscle weakness (generalized)  Difficulty in walking, not elsewhere classified     Problem List Patient Active Problem List   Diagnosis Date Noted  . Lumbar spondylosis 03/07/2018  . Chronic pain of left knee 03/07/2018  . Chronic upper back pain 12/08/2017  . Benign neoplasm of hand  10/20/2017  . Chronic pain syndrome 10/20/2017  . Chronic pain of right lower extremity 10/20/2017  . Chronic myofascial pain 10/20/2017  . Numbness and tingling 07/12/2017  . Sleep difficulties 07/12/2017  . Low back pain 01/18/2017  . Sleep behavior disorder, REM 01/18/2017  . Lumbar radiculopathy 11/01/2016  . Follicular lymphoma of extranodal and solid organ sites [facility]) 05/01/2016  . Lymphoma ([place]) 04/30/2016  . Hallux limitus 07/27/2015  . Low back pain with sciatica 02/13/2015  . Bilateral low back pain with sciatica 02/13/2015  . H/O adenomatous polyp of colon 04/06/2014  . Hx of adenomatous colonic polyps 04/06/2014  . Back ache 01/25/2014  . Adiposity 01/25/2014  . REM sleep behavior disorder 01/25/2014  . Disordered sleep 01/25/2014  . Has a tremor 01/25/2014  . Obesity, unspecified 01/25/2014  . Sleep disorder 01/25/2014  . Backache 01/25/2014  . Tremor 01/25/2014  . Difficulty hearing 06/08/2012  . Hearing loss 06/08/2012  . Anxiety 06/06/2012  . Colon polyp 06/06/2012  . Clinical depression 06/06/2012  . Hypercholesteremia 06/06/2012  . Idiopathic Parkinson's disease ([place]) 06/06/2012  . Detached retina 06/06/2012  . Depression 06/06/2012  . Retinal detachment 06/06/2012  . Parkinson's disease ([place]) 06/06/2012   Joneen Boers PT, DPT   04/20/2018, 6:21 PM  [facility] [address], [place], [address] Phone: [phone]   Fax:  [phone]  Name: Sarah Donaldson MRN: [id number] Date of Birth: September 20, 1950

## 2018-04-21 ENCOUNTER — Other Ambulatory Visit: Payer: Self-pay

## 2018-04-21 ENCOUNTER — Encounter: Payer: Self-pay | Admitting: Nurse Practitioner

## 2018-04-21 ENCOUNTER — Ambulatory Visit: Payer: Medicare Other | Attending: Nurse Practitioner | Admitting: Nurse Practitioner

## 2018-04-21 VITALS — BP 128/80 | HR 83 | Temp 98.0°F | Resp 18 | Ht 66.0 in | Wt 163.0 lb

## 2018-04-21 DIAGNOSIS — F419 Anxiety disorder, unspecified: Secondary | ICD-10-CM | POA: Insufficient documentation

## 2018-04-21 DIAGNOSIS — M7918 Myalgia, other site: Secondary | ICD-10-CM

## 2018-04-21 DIAGNOSIS — Z981 Arthrodesis status: Secondary | ICD-10-CM | POA: Diagnosis not present

## 2018-04-21 DIAGNOSIS — E7800 Pure hypercholesterolemia, unspecified: Secondary | ICD-10-CM | POA: Diagnosis not present

## 2018-04-21 DIAGNOSIS — M5116 Intervertebral disc disorders with radiculopathy, lumbar region: Secondary | ICD-10-CM | POA: Diagnosis not present

## 2018-04-21 DIAGNOSIS — Z8249 Family history of ischemic heart disease and other diseases of the circulatory system: Secondary | ICD-10-CM | POA: Diagnosis not present

## 2018-04-21 DIAGNOSIS — Z8572 Personal history of non-Hodgkin lymphomas: Secondary | ICD-10-CM | POA: Diagnosis not present

## 2018-04-21 DIAGNOSIS — F329 Major depressive disorder, single episode, unspecified: Secondary | ICD-10-CM | POA: Diagnosis not present

## 2018-04-21 DIAGNOSIS — G894 Chronic pain syndrome: Secondary | ICD-10-CM | POA: Diagnosis not present

## 2018-04-21 DIAGNOSIS — Z6826 Body mass index (BMI) 26.0-26.9, adult: Secondary | ICD-10-CM | POA: Diagnosis not present

## 2018-04-21 DIAGNOSIS — M79604 Pain in right leg: Secondary | ICD-10-CM

## 2018-04-21 DIAGNOSIS — Z882 Allergy status to sulfonamides status: Secondary | ICD-10-CM | POA: Diagnosis not present

## 2018-04-21 DIAGNOSIS — M545 Low back pain: Secondary | ICD-10-CM | POA: Diagnosis not present

## 2018-04-21 DIAGNOSIS — M47816 Spondylosis without myelopathy or radiculopathy, lumbar region: Secondary | ICD-10-CM | POA: Diagnosis not present

## 2018-04-21 DIAGNOSIS — E669 Obesity, unspecified: Secondary | ICD-10-CM | POA: Insufficient documentation

## 2018-04-21 DIAGNOSIS — Z87891 Personal history of nicotine dependence: Secondary | ICD-10-CM | POA: Diagnosis not present

## 2018-04-21 DIAGNOSIS — Z9889 Other specified postprocedural states: Secondary | ICD-10-CM | POA: Insufficient documentation

## 2018-04-21 DIAGNOSIS — G8929 Other chronic pain: Secondary | ICD-10-CM | POA: Diagnosis not present

## 2018-04-21 DIAGNOSIS — Z888 Allergy status to other drugs, medicaments and biological substances status: Secondary | ICD-10-CM | POA: Insufficient documentation

## 2018-04-21 DIAGNOSIS — Z79899 Other long term (current) drug therapy: Secondary | ICD-10-CM | POA: Insufficient documentation

## 2018-04-21 DIAGNOSIS — Z86010 Personal history of colonic polyps: Secondary | ICD-10-CM | POA: Insufficient documentation

## 2018-04-21 DIAGNOSIS — M1712 Unilateral primary osteoarthritis, left knee: Secondary | ICD-10-CM | POA: Diagnosis not present

## 2018-04-21 DIAGNOSIS — G20 Parkinson's disease: Secondary | ICD-10-CM | POA: Insufficient documentation

## 2018-04-21 NOTE — Progress Notes (Signed)
Patient's Name: Sarah Donaldson  MRN: [id number]  Referring Provider: Cletis Athens, MD  DOB: [birth year]  PCP: Cletis Athens, MD  DOS: 04/21/2018  Note by: Vevelyn Francois NP  Service setting: Ambulatory outpatient  Specialty: Interventional Pain Management  Location: [facility] (AMB) Pain Management Facility    Patient type: Established    Primary Reason(s) for Visit: Evaluation of chronic illnesses with exacerbation, or progression (Level of risk: moderate) CC: Back Pain (right, lower)  HPI  Sarah Donaldson is a 67 y.o. year old, adult patient, who comes today for a follow-up evaluation. She has Anxiety; Back ache; Low back pain with sciatica; Colon polyp; Clinical depression; Difficulty hearing; H/O adenomatous polyp of colon; Hypercholesteremia; Adiposity; Idiopathic Parkinson's disease ([place]); REM sleep behavior disorder; Detached retina; Disordered sleep; Has a tremor; Hallux limitus; Lymphoma ([place]); Follicular lymphoma of extranodal and solid organ sites [facility]); Benign neoplasm of hand; Hearing loss; Numbness and tingling; Sleep difficulties; Chronic pain syndrome; Chronic pain of right lower extremity; Chronic myofascial pain; Obesity, unspecified; Sleep disorder; Backache; Bilateral low back pain with sciatica; Low back pain; Lumbar radiculopathy; Depression; Retinal detachment; Hx of adenomatous colonic polyps; Tremor; Parkinson's disease ([place]); Sleep behavior disorder, REM; Chronic upper back pain; Lumbar spondylosis; Chronic pain of left knee; Numbness; and Obesity on their problem list. Sarah Donaldson was last seen on 03/07/2018. Her primarily concern today is the Back Pain (right, lower)  Pain Assessment: Location: Left Back Radiating: front of right upper leg Onset: More than a month ago Duration: Chronic pain Quality: Cramping(pins and needles) Severity: 6 /10 (subjective, self-reported pain score)  Note: Reported level is compatible with observation. Clinically the patient looks like a 1/10 A 1/10  is viewed as "Mild" and described as nagging, annoying, but not interfering with basic activities of daily living (ADL). Sarah Donaldson is able to eat, bathe, get dressed, do toileting (being able to get on and off the toilet and perform personal hygiene functions), transfer (move in and out of bed or a chair without assistance), and maintain continence (able to control bladder and bowel functions). Physiologic parameters such as blood pressure and heart rate apear wnl. Sarah Donaldson does not seem to understand the use of our objective pain scale  Effect on ADL:   Timing: Intermittent Modifying factors: heat BP: 128/80  HR: 83  Further details on both, my assessment(s), as well as the proposed treatment plan, please see below.  She was seen for allergic reaction.  They are here just for follow-up.  He admits that when she went to the ER she was having swelling in her lips and throat however it was not visible.  She was having left arm numbness along with below the knee numbness.  She was started on extended release carbidopa levodopa however has discontinued this.  She feels like she had a reaction to this years ago but is unsure.  She has been on Lyrica for several months and has done well.  She denies any changes in her daily routine any different foods any type of new products.  However she later remembered that the Lyrica have been switched to a generic form instead of been a white capsule it is now a white and blue capsule.  She said that she felt like there was some tingling in her lips this morning after taking Lyrica however she is unsure.  Laboratory Chemistry  Inflammation Markers (CRP: Acute Phase) (ESR: Chronic Phase) Lab Results  Component Value Date   ESRSEDRATE 4 06/07/2013  Rheumatology Markers No results found for: RF, ANA, LABURIC, URICUR, LYMEIGGIGMAB, LYMEABIGMQN, HLAB27                      Renal Function Markers Lab Results  Component Value Date   BUN 31  (H) 11/19/2017   CREATININE 0.61 11/19/2017   GFRAA >60 11/19/2017   GFRNONAA >60 11/19/2017                             Hepatic Function Markers Lab Results  Component Value Date   AST 18 11/19/2017   ALT 8 (L) 11/19/2017   ALBUMIN 3.9 11/19/2017   ALKPHOS 105 11/19/2017                        Electrolytes Lab Results  Component Value Date   NA 140 11/19/2017   K 4.0 11/19/2017   CL 108 11/19/2017   CALCIUM 9.2 11/19/2017                        Neuropathy Markers No results found for: VITAMINB12, FOLATE, HGBA1C, HIV                      CNS Tests No results found for: SDES, GRAMSTAIN, CULT, COLORCSF, APPEARCSF, RBCCOUNTCSF, WBCCSF, POLYSCSF, LYMPHSCSF, EOSCSF, PROTEINCSF, GLUCCSF, JCVIRUS, CSFOLI, IGGCSF, IGGALB, IGGIND                      Bone Pathology Markers No results found for: [place], [id number], [id number], [id number], 25OHVITD1, 25OHVITD2, 25OHVITD3, TESTOFREE, TESTOSTERONE                       Coagulation Parameters Lab Results  Component Value Date   PLT 185 11/19/2017                        Cardiovascular Markers Lab Results  Component Value Date   HGB 14.5 11/19/2017   HCT 42.7 11/19/2017                         CA Markers No results found for: CEA, CA125, LABCA2                      Note: Lab results reviewed.  Recent Diagnostic Imaging Review   Thoracic Imaging:  Results for orders placed during the hospital encounter of 12/08/17  DG Thoracic Spine 2 View   Narrative CLINICAL DATA:  Chronic upper back pain  EXAM: THORACIC SPINE 2 VIEWS  COMPARISON:  06/10/2017 report of the lumbar spine  FINDINGS: There is no evidence of thoracic spine fracture. No suspicious osseous lesions of the thoracic spine. Multilevel small anterior osteophytes are seen along the thoracic spine. Alignment is normal. L1-2 lumbar spinal fusion hardware partially included. No other significant bone abnormalities are identified.  IMPRESSION: Mild degenerative  endplate spurring and slight multilevel disc space narrowing of the thoracic spine without acute osseous abnormality. Redemonstration of L1-2 spinal fusion hardware without hardware failure.   Electronically Signed   By: Ashley Royalty M.D.   On: 12/08/2017 23:11     Lumbosacral Imaging: Lumbar MR wo contrast:  Results for orders placed during the hospital encounter of 06/28/17  MR LUMBAR SPINE WO CONTRAST   Narrative CLINICAL DATA:  Lumbar radiculopathy. Right leg  pain. Lumbar fusion 1 year ago  EXAM: MRI LUMBAR SPINE WITHOUT CONTRAST  TECHNIQUE: Multiplanar, multisequence MR imaging of the lumbar spine was performed. No intravenous contrast was administered.  COMPARISON:  Lumbar MRI 05/12/2016  FINDINGS: Segmentation:  Standard  Alignment:  Normal alignment  Vertebrae: Negative for fracture or mass. Asymmetric edema around the disc space on the right at L2-3 has developed since the prior study and appears be due to disc degeneration which is asymmetric on the right.  Conus medullaris and cauda equina: Conus extends to the L1-2 level. Conus and cauda equina appear normal.  Paraspinal and other soft tissues: Negative for mass or fluid collection  Disc levels:  T12-L1:  Mild disc degeneration without stenosis  L1-2: Interval pedicle screw and interbody fusion. Negative for stenosis. Removal of posterior osteophyte on the right. Neural foramina patent  L2-3: Asymmetric disc degeneration on the right with disc space narrowing and bone marrow edema. Right lateral spurring with mild right foraminal encroachment. Mild spinal stenosis.  L3-4: Disc degeneration with disc bulging and spurring. Mild left foraminal narrowing appears unchanged.  L4-5: Disc degeneration with disc bulging and spurring left greater than right appears unchanged. Mild facet hypertrophy. Mild subarticular stenosis on the left unchanged  L5-S1: Moderate disc degeneration with diffuse endplate  spurring. No significant stenosis.  IMPRESSION: Interval pedicle screw and interbody fusion L1-2 without stenosis  Progressive disc degeneration on the right at L2-3 with disc space narrowing spurring and bone marrow edema. Mild spinal stenosis and mild right foraminal stenosis  Mild left foraminal narrowing L3-4 unchanged.  Mild subarticular stenosis on the left L4-5 due to spurring also unchanged.   Electronically Signed   By: Franchot Gallo M.D.   On: 06/28/2017 14:46    Lumbar MR wo contrast: No procedure found. Lumbar MR w/wo contrast:  Results for orders placed during the hospital encounter of 04/18/14  MR Lumbar Spine W Wo Contrast   Narrative CLINICAL DATA:  Bilateral leg pain and low back pain for several months. History of non-Hodgkin's lymphoma. Parkinson's disease. No spinal surgery. LUMBAGO  EXAM: MRI LUMBAR SPINE WITHOUT AND WITH CONTRAST  TECHNIQUE: Multiplanar and multiecho pulse sequences of the lumbar spine were obtained without and with intravenous contrast.  CONTRAST:  76m MULTIHANCE GADOBENATE DIMEGLUMINE 529 MG/ML IV SOLN  BUN and creatinine [facility]  [address]  Results:  BUN 20 mg/dL,  Creatinine 0.8 mg/dL.  COMPARISON:  PET-CT 02/01/2014.  Radiographs 07/27/2013.  FINDINGS: Segmentation: The numbering convention used for this exam termed L5-S1 as the last intervertebral disc space. Five lumbar type vertebral bodies identified on prior radiographs.  Alignment: There is no spondylolisthesis. Levoconvex curve of the lumbar spine is present with the apex at L3. Curvature is mild.  Vertebrae: Vertebral body height preserved. Bone marrow signal shows heterogenous marrow. This is a nonspecific finding most commonly associated with obesity, anemia, cigarette smoking or chronic disease. Mild degenerative endplate changes at LT2-I7 mild degenerative endplate changes. No destructive osseous  lesions.  Conus medullaris: Normal termination at L1.  Paraspinal tissues: Within normal limits. No visible retroperitoneal adenopathy.  Disc levels:  L1-L2: Disc desiccation. Shallow broad-based disc bulging. There is a low LEFT foraminal and extra foraminal zone disc protrusion without resulting stenosis. This could irritate the exited LEFT L1 nerve.  L2-L3: Disc desiccation and broad-based disc bulging. Minimal central stenosis. Foramina patent.  L3-L4: Mild disc desiccation and loss of height. Asymmetric right-sided ligamentum flavum redundancy. Central  canal and lateral recesses are patent. The foramina also appear adequately patent.  L4-L5: RIGHT eccentric disc bulging. LEFT-greater-than-RIGHT facet arthrosis. Mild LEFT facet arthritis. Central canal and lateral recesses appear adequately patent. There is mild LEFT foraminal stenosis associated with asymmetric left-sided disc space collapse, endplate spurring and facet arthrosis.  L5-S1: Disc desiccation and degeneration. Central canal and lateral recesses patent. Broad-based posterior disc bulging and shallow endplate osteophytes. Mild RIGHT foraminal stenosis.  After contrast administration, there is no pathologic enhancement in the lumbar spine.  IMPRESSION: Mild multilevel lumbar spondylosis, expected for age. Mild LEFT L4-L5 facet arthritis.   Electronically Signed   By: Dereck Ligas M.D.   On: 04/18/2014 14:40    Lumbar MR w contrast: No results found for this or any previous visit. Lumbar CT wo contrast:  Results for orders placed during the hospital encounter of 06/09/16  CT LUMBAR SPINE WO CONTRAST   Narrative CLINICAL DATA:  Evaluation for low back pain with extension into right anterior hip in into right lower extremity. History of lymphoma.  EXAM: CT LUMBAR SPINE WITHOUT CONTRAST  TECHNIQUE: Multidetector CT imaging of the lumbar spine was performed without intravenous contrast  administration. Multiplanar CT image reconstructions were also generated.  COMPARISON:  None available.  FINDINGS: Segmentation: Normal segmentation. Lowest well-formed disc is labeled the L5-S1 level.  Alignment: Levoscoliosis with apex at L1-2. Vertebral bodies otherwise normally aligned with preservation of the normal lumbar lordosis. No listhesis.  Vertebrae: Vertebral body heights maintained. No evidence for acute or chronic fracture. Advanced reactive endplate changes present about the L1-2 interspace. Degenerative intervertebral disc space narrowing with disc desiccation throughout the entire lumbar spine, most prevalent at L1-2 as well. No worrisome osseous lesions. Visualized sacrum intact. SI joints symmetric and within normal limits bilaterally.  Paraspinal and other soft tissues: Paraspinous soft tissues are within normal limits. Scattered aorto bi-iliac atherosclerotic disease noted. Visualized visceral structures are unremarkable.  Disc levels:  T12-L1: Mild diffuse disc bulge with disc desiccation. No stenosis.  L1-2: Diffuse degenerative disc bulge with intervertebral disc space narrowing, disc desiccation, and prominent reactive endplate changes. Disc bulging these eccentric to the right. No focal disc protrusion. Moderate bilateral facet arthrosis, slightly worse on the right. Mild right lateral recess stenosis related to endplate osteophytic spurring and bony overgrowth at the right L1-2 facet. No significant canal narrowing. Moderate to severe right foraminal stenosis (series 7, image 40).  L2-3: Diffuse degenerative disc bulge, eccentric to the left, with disc desiccation. Bilateral facet hypertrophy. No focal disc herniation. No significant canal stenosis. No significant foraminal encroachment.  L3-4: Diffuse degenerative disc bulge with disc desiccation and intervertebral disc space narrowing. Bulging disc flattens the ventral thecal sac.  Superimposed mild facet hypertrophy. No significant canal stenosis. Mild left foraminal narrowing related to disc bulge and facet disease.  L4-5: Mild diffuse disc bulge with disc desiccation and intervertebral disc space narrowing. Superimposed mild bilateral facet arthrosis, slightly worse on the left. Resultant mild canal and left lateral recess stenosis. Mild to moderate bilateral foraminal narrowing related to disc bulge and facet disease, slightly worse on the left.  L5-S1: Diffuse degenerative disc bulge with disc desiccation and intervertebral disc space narrowing. Moderate bilateral facet arthrosis. No significant canal stenosis. Moderate bilateral foraminal narrowing related to disc osteophyte and bony overgrowth at the bilateral L5-S1 facets.  IMPRESSION: 1. Advanced degenerative disc disease at L1-2 with reactive endplate changes, with superimposed facet arthropathy. Resultant moderate to severe right foraminal stenosis, potentially affecting the right L1 nerve  root. 2. Moderate bilateral foraminal stenosis at L5-S1 related to disc osteophyte and osseous overgrowth at the bilateral L5-S1 facets. 3. Additional more mild multilevel degenerative spondylolysis and facet arthrosis at L2-3, L3-4, and L4-5 as above. 4. Levoscoliosis with apex at L1-2.   Electronically Signed   By: Jeannine Boga M.D.   On: 06/09/2016 16:34    Lumbar DG 2-3 views:  Results for orders placed during the hospital encounter of 04/19/18  DG Lumbar Spine 2-3 Views   Narrative CLINICAL DATA:  Fall.  EXAM: LUMBAR SPINE - 2-3 VIEW  COMPARISON:  Lumbar spine x-rays dated March 07, 2018.  FINDINGS: Five lumbar type vertebral bodies. Prior L1-L2 PLIF. No evidence of hardware failure or loosening. No acute fracture or subluxation. Vertebral body heights are preserved.  Unchanged mild levocurvature. Unchanged trace stepwise retrolisthesis at L1-L2, L2-L3, and L3-L4. Unchanged mild  disc height loss at T12-L1, L3-L4, and L4-L5, severe disc height loss with endplate reactive changes at L2-L3, and moderate disc height loss at L5-S1. Unchanged moderate bilateral facet arthropathy at L4-L5 and L5-S1.  The sacroiliac joints are unremarkable.  IMPRESSION: 1.  No acute osseous abnormality. 2. Prior L1-L2 PLIF without hardware complication. Unchanged severe adjacent segment degenerative disc disease at L2-L3. 3. Mild-to-moderate lumbar spondylosis throughout the remaining lumbar spine is similar to prior study.   Electronically Signed   By: Titus Dubin M.D.   On: 04/19/2018 08:17    Lumbar DG (Complete) 4+V:  Results for orders placed during the hospital encounter of 03/26/16  DG Lumbar Spine Complete   Narrative CLINICAL DATA:  Right-sided sciatic symptoms, low back pain radiating into the right hip and right leg for the past year.  EXAM: LUMBAR SPINE - COMPLETE 4+ VIEW  COMPARISON:  Lumbar spine MRI dated April 18, 2014 and lumbar spine series of July 27, 2013.  FINDINGS: The bones are subjectively osteopenic. There is mild levocurvature centered at L1-2 that is more conspicuous today. The pedicles and transverse processes are intact. The vertebral bodies are preserved in height. There is moderate degenerative disc space narrowing at T12-L1 and L1-L2. There is facet joint hypertrophy at L4-5 and L5-S1. The observed portions of the sacrum are normal.  IMPRESSION: There is no compression fracture nor other acute bony abnormality. There is degenerative disc space narrowing at T12-L1 and L1-L2. There is facet joint hypertrophy at L4-5 and L5-S1. Mild levocurvature is present centered at L1-2.   Electronically Signed   By: David  Martinique M.D.   On: 03/27/2016 07:54    Lumbar DG F/E views: No results found for this or any previous visit. Lumbar DG Bending views:  Results for orders placed during the hospital encounter of 03/07/18  DG Lumbar  Spine Complete W/Bend   Narrative CLINICAL DATA:  67 year old female with lower right-sided back pain for over year. Fall last night. Prior surgery. History of non-Hodgkin's lymphoma. Initial encounter.  EXAM: LUMBAR SPINE - COMPLETE WITH BENDING VIEWS  COMPARISON:  12/08/2017 plain film exam of the lumbar spine. 08/28/2016 MR.  FINDINGS: Prior fusion L1-2 with pedicle screws, posterior connecting bar and interbody spacer. Hardware appears in a similar position as best appreciated on standing extension view.  Mild curvature convex left unchanged.  No acute compression fracture noted.  Moderate T12-L1 disc space narrowing.  Marked L2-3 disc space narrowing with prominent surrounding endplate reactive changes and sclerosis. Mild loss of height L3 vertebra greater on the right similar to prior exam.  Moderate L3-4 disc space narrowing greater on  left.  Mild L4-5 disc space narrowing and facet degenerative changes.  Moderate L5-S1 disc space narrowing. Mild L5-S1 facet degenerative changes.  No abnormal motion occurs between flexion and extension.  No osseous destructive lesion noted. Sacroiliac joints intact. Mild hip joint degenerative changes.  Vascular calcifications  IMPRESSION: 1. Prior fusion L1-2. 2. Scoliosis with superimposed degenerative changes as detailed above. Findings most prominent L2-3 level and similar to prior exam. 3. No abnormal motion between flexion and extension. 4. No acute compression fracture noted.   Electronically Signed   By: Genia Del M.D.   On: 03/07/2018 20:02   Foot Imaging: Foot-R DG Complete:  Results for orders placed in visit on 02/19/17  DG Foot Complete Right   Narrative Please see detailed radiograph report in office note.   Foot-L DG Complete:  Results for orders placed in visit on 08/16/14  DG Foot Complete Left   Narrative 3 views of a skeletally mature individual were obtained of the left foot.  Study  includes AP, oblique, lateral projections.  Decrease in calcaneal inclination angle with an increase in talar  declination angle consistent with flatfoot deformity. There is no evidence  of acute fracture or stress fracture at this time.    Complexity Note: Imaging results reviewed. Results shared with Ms. Reino, using Layman's terms.                         Meds   Current Outpatient Medications:  .  acetaminophen (TYLENOL) 650 MG CR tablet, Take 650 mg by mouth 2 (two) times daily., Disp: , Rfl:  .  carbidopa-levodopa (SINEMET IR) 25-100 MG per tablet, Take 1-2 tablets by mouth See admin instructions. 2 tablets first thing in the morning, 1 tablet in the late morning, 2 tablets early afternoon, 1 tablet late afternoon, and 2 tablets every evening (1-2 tablets every 3 hours), Disp: , Rfl: 0 .  clonazePAM (KLONOPIN) 2 MG tablet, Take 1-3 mg by mouth See admin instructions. 1 mg every morning and 3 mg at bedtime, Disp: , Rfl:  .  furosemide (LASIX) 40 MG tablet, Take 20 mg by mouth 2 (two) times a week. Every Sunday and every Wednesday/Thursday, Disp: , Rfl:  .  omeprazole (PRILOSEC) 20 MG capsule, Take 1 capsule (20 mg total) by mouth daily., Disp: 90 capsule, Rfl: 0 .  pramipexole (MIRAPEX) 1.5 MG tablet, Take 1.5 mg by mouth 3 (three) times daily., Disp: , Rfl: 5 .  pregabalin (LYRICA) 25 MG capsule, Take 1 capsule (25 mg total) by mouth 4 (four) times daily., Disp: 120 capsule, Rfl: 2 .  promethazine (PHENERGAN) 12.5 MG tablet, Take 12.5 mg by mouth every 6 (six) hours as needed for nausea or vomiting., Disp: , Rfl:  .  ranitidine (ZANTAC) 150 MG capsule, Take 150 mg by mouth daily. , Disp: , Rfl:  .  selegiline (ELDEPRYL) 5 MG capsule, Take 5 mg by mouth 2 (two) times daily with breakfast and lunch. , Disp: , Rfl: 1 .  traZODone (DESYREL) 50 MG tablet, Take 50-100 mg by mouth at bedtime as needed for sleep. , Disp: , Rfl: 1 .  neomycin-polymyxin b-dexamethasone (MAXITROL) 3.5-10000-0.1  OINT, Place 1 application into the left eye at bedtime., Disp: , Rfl: 2 .  prednisoLONE acetate (PRED FORTE) 1 % ophthalmic suspension, Place 1 drop into the left eye daily., Disp: , Rfl: 5  ROS  Constitutional: Denies any fever or chills Gastrointestinal: No reported hemesis, hematochezia, vomiting, or  acute GI distress Musculoskeletal: Denies any acute onset joint swelling, redness, loss of ROM, or weakness Neurological: No reported episodes of acute onset apraxia, aphasia, dysarthria, agnosia, amnesia, paralysis, loss of coordination, or loss of consciousness  Allergies  Ms. Burd is allergic to codeine sulfate and gabapentin.  [place]  Drug: Ms. Headrick  reports that she does not use drugs. Alcohol:  reports that she drinks alcohol. Tobacco:  reports that she has quit smoking. She has never used smokeless tobacco. Medical:  has a past medical history of Cancer [facility]), Knee joint cyst, left (02/07/2018), Parkinson disease ([place]), and Parkinson disease ([place]). Surgical: Ms. Lofton  has a past surgical history that includes Retinal detachment surgery; Bunionectomy; Nasal septum surgery; and Back surgery. Family: family history includes Heart disease in her mother.  Constitutional Exam  General appearance: Well nourished, well developed, and well hydrated. In no apparent acute distress Vitals:   04/21/18 1435  BP: 128/80  Pulse: 83  Resp: 18  Temp: 98 F (36.7 C)  TempSrc: Oral  SpO2: 93%  Weight: 163 lb (73.9 kg)  Height: '5\' 6"'  (1.676 m)   BMI Assessment: Estimated body mass index is 26.31 kg/m as calculated from the following:   Height as of this encounter: '5\' 6"'  (1.676 m).   Weight as of this encounter: 163 lb (73.9 kg).  BMI interpretation table: BMI level Category Range association with higher incidence of chronic pain  <18 kg/m2 Underweight   18.5-24.9 kg/m2 Ideal body weight   25-29.9 kg/m2 Overweight Increased incidence by 20%  30-34.9 kg/m2 Obese (Class I) Increased  incidence by 68%  35-39.9 kg/m2 Severe obesity (Class II) Increased incidence by 136%  >40 kg/m2 Extreme obesity (Class III) Increased incidence by 254%   Patient's current BMI Ideal Body weight  Body mass index is 26.31 kg/m. Ideal body weight: 59.3 kg (130 lb 11.7 oz) Adjusted ideal body weight: 65.2 kg (143 lb 10.2 oz)   BMI Readings from Last 4 Encounters:  04/21/18 26.31 kg/m  04/19/18 26.63 kg/m  03/07/18 26.79 kg/m  01/24/18 26.23 kg/m   Wt Readings from Last 4 Encounters:  04/21/18 163 lb (73.9 kg)  04/19/18 165 lb (74.8 kg)  03/07/18 166 lb (75.3 kg)  01/24/18 165 lb (74.8 kg)  Psych/Mental status: Alert, oriented x 3 (person, place, & time)       Eyes: PERLA Respiratory: No evidence of acute respiratory distress  Cervical Spine Area Exam  Skin & Axial Inspection: No masses, redness, edema, swelling, or associated skin lesions Alignment: Symmetrical Functional ROM: Unrestricted ROM      Stability: No instability detected Muscle Tone/Strength: Functionally intact. No obvious neuro-muscular anomalies detected. Sensory (Neurological): Unimpaired Palpation: No palpable anomalies              Upper Extremity (UE) Exam    Side: Right upper extremity  Side: Left upper extremity  Skin & Extremity Inspection: Skin color, temperature, and hair growth are WNL. No peripheral edema or cyanosis. No masses, redness, swelling, asymmetry, or associated skin lesions. No contractures.  Skin & Extremity Inspection: Skin color, temperature, and hair growth are WNL. No peripheral edema or cyanosis. No masses, redness, swelling, asymmetry, or associated skin lesions. No contractures.  Functional ROM: Unrestricted ROM          Functional ROM: Unrestricted ROM          Muscle Tone/Strength: Functionally intact. No obvious neuro-muscular anomalies detected.  Muscle Tone/Strength: Functionally intact. No obvious neuro-muscular anomalies detected.  Sensory (Neurological): Unimpaired  Sensory (Neurological): Unimpaired          Palpation: No palpable anomalies              Palpation: No palpable anomalies              Provocative Test(s):  Phalen's test: deferred Tinel's test: deferred Apley's scratch test (touch opposite shoulder):  Action 1 (Across chest): deferred Action 2 (Overhead): deferred Action 3 (LB reach): deferred   Provocative Test(s):  Phalen's test: deferred Tinel's test: deferred Apley's scratch test (touch opposite shoulder):  Action 1 (Across chest): deferred Action 2 (Overhead): deferred Action 3 (LB reach): deferred    Thoracic Spine Area Exam  Skin & Axial Inspection: No masses, redness, or swelling Alignment: Symmetrical Functional ROM: Unrestricted ROM Stability: No instability detected Muscle Tone/Strength: Functionally intact. No obvious neuro-muscular anomalies detected. Sensory (Neurological): Unimpaired Muscle strength & Tone: No palpable anomalies  Lumbar Spine Area Exam  Skin & Axial Inspection: No masses, redness, or swelling Alignment: Symmetrical Functional ROM: Unrestricted ROM       Stability: No instability detected Muscle Tone/Strength: Functionally intact. No obvious neuro-muscular anomalies detected. Sensory (Neurological): Unimpaired Palpation: No palpable anomalies       Provocative Tests: Hyperextension/rotation test: deferred today       Lumbar quadrant test (Kemp's test): deferred today       Lateral bending test: deferred today       Patrick's Maneuver: deferred today                   FABER test: deferred today                   S-I anterior distraction/compression test: deferred today         S-I lateral compression test: deferred today         S-I Thigh-thrust test: deferred today         S-I Gaenslen's test: deferred today          Gait & Posture Assessment  Ambulation: Unassisted Gait: Relatively normal for age and body habitus Posture: WNL   Lower Extremity Exam    Side: Right lower extremity   Side: Left lower extremity  Stability: No instability observed          Stability: No instability observed          Skin & Extremity Inspection: Skin color, temperature, and hair growth are WNL. No peripheral edema or cyanosis. No masses, redness, swelling, asymmetry, or associated skin lesions. No contractures.  Skin & Extremity Inspection: Skin color, temperature, and hair growth are WNL. No peripheral edema or cyanosis. No masses, redness, swelling, asymmetry, or associated skin lesions. No contractures.  Functional ROM: Unrestricted ROM                  Functional ROM: Unrestricted ROM                  Muscle Tone/Strength: Functionally intact. No obvious neuro-muscular anomalies detected.  Muscle Tone/Strength: Functionally intact. No obvious neuro-muscular anomalies detected.  Sensory (Neurological): Unimpaired  Sensory (Neurological): Unimpaired  Palpation: No palpable anomalies  Palpation: No palpable anomalies   Assessment  Primary Diagnosis & Pertinent Problem List: The primary encounter diagnosis was Lumbar spondylosis. Diagnoses of Chronic pain of right lower extremity, Chronic myofascial pain, and Chronic pain syndrome were also pertinent to this visit.  Status Diagnosis  Controlled Controlled Controlled 1. Lumbar spondylosis   2. Chronic pain of right lower extremity  3. Chronic myofascial pain   4. Chronic pain syndrome     Problems updated and reviewed during this visit: Problem  Numbness  Obesity   Plan of Care  Pharmacotherapy (Medications Ordered): No orders of the defined types were placed in this encounter.  New Prescriptions   No medications on file   Medications administered today: [name] had no medications administered during this visit. Lab-work, procedure(s), and/or referral(s): No orders of the defined types were placed in this encounter.  Imaging and/or referral(s): None  Interventional therapies: Planned, scheduled, and/or pending:    Not at this time.  Structured husband to monitor her very closely encourage drinking full glass of water after taking Lyrica.  Instructed patient is not wise to just rapidly discontinue the use of Lyrica but using brand-name might be an option.  We will continue to monitor closely they are to call me with any changes and stop it if needed immediately.  They verbalized understanding and will call as needed we will continue the Lyrica for now and follow-up as scheduled    Provider-requested follow-up: No follow-ups on file.  Future Appointments  Date Time Provider [place]  04/27/2018  1:00 PM Madaline Savage, PT [facility] None  05/10/2018 10:00 AM [facility] [place]  05/12/2018  1:45 PM Joneen Boers R, PT [facility] None  05/16/2018 11:15 AM Joneen Boers R, PT [facility] None  05/18/2018  8:30 AM [facility]  05/19/2018 10:15 AM [facility] None  05/19/2018 10:45 AM Cammie Sickle, MD [facility] None  05/19/2018  3:00 PM Madaline Savage, PT [facility] None  06/02/2018 10:45 AM Vevelyn Francois, NP [facility] None   Primary Care Physician: Cletis Athens, MD Location: [facility] Note by: Vevelyn Francois NP Date: 04/21/2018; Time: 3:01 PM  Pain Score Disclaimer: We use the NRS-11 scale. This is a self-reported, subjective measurement of pain severity with only modest accuracy. It is used primarily to identify changes within a particular patient. It must be understood that outpatient pain scales are significantly less accurate that those used for research, where they can be applied under ideal controlled circumstances with minimal exposure to variables. In reality, the score is likely to be a combination of pain intensity and pain affect, where pain affect describes the degree of emotional arousal or changes in action readiness caused by the sensory experience of pain. Factors such as social and work situation, setting, emotional  state, anxiety levels, expectation, and prior pain experience may influence pain perception and show large inter-individual differences that may also be affected by time variables.  Patient instructions provided during this appointment: Patient Instructions  ____________________________________________________________________________________________  Medication Rules  Applies to: All patients receiving prescriptions (written or electronic).  Pharmacy of record: Pharmacy where electronic prescriptions will be sent. If written prescriptions are taken to a different pharmacy, please inform the nursing staff. The pharmacy listed in the electronic medical record should be the one where you would like electronic prescriptions to be sent.  Prescription refills: Only during scheduled appointments. Applies to both, written and electronic prescriptions.  NOTE: The following applies primarily to controlled substances (Opioid* Pain Medications).   Patient's responsibilities: 1. Pain Pills: Bring all pain pills to every appointment (except for procedure appointments). 2. Pill Bottles: Bring pills in original pharmacy bottle. Always bring newest bottle. Bring bottle, even if empty. 3. Medication refills: You are responsible for knowing and keeping track of what medications you need refilled. The  day before your appointment, write a list of all prescriptions that need to be refilled. Bring that list to your appointment and give it to the admitting nurse. Prescriptions will be written only during appointments. If you forget a medication, it will not be "Called in", "Faxed", or "electronically sent". You will need to get another appointment to get these prescribed. 4. Prescription Accuracy: You are responsible for carefully inspecting your prescriptions before leaving our office. Have the discharge nurse carefully go over each prescription with you, before taking them home. Make sure that your name is accurately  spelled, that your address is correct. Check the name and dose of your medication to make sure it is accurate. Check the number of pills, and the written instructions to make sure they are clear and accurate. Make sure that you are given enough medication to last until your next medication refill appointment. 5. Taking Medication: Take medication as prescribed. Never take more pills than instructed. Never take medication more frequently than prescribed. Taking less pills or less frequently is permitted and encouraged, when it comes to controlled substances (written prescriptions).  6. Inform other Doctors: Always inform, all of your healthcare providers, of all the medications you take. 7. Pain Medication from other Providers: You are not allowed to accept any additional pain medication from any other Doctor or Healthcare provider. There are two exceptions to this rule. (see below) In the event that you require additional pain medication, you are responsible for notifying us, as stated below. 8. Medication Agreement: You are responsible for carefully reading and following our Medication Agreement. This must be signed before receiving any prescriptions from our practice. Safely store a copy of your signed Agreement. Violations to the Agreement will result in no further prescriptions. (Additional copies of our Medication Agreement are available upon request.) 9. Laws, Rules, & Regulations: All patients are expected to follow all Federal and [employer], Rules, [employer]. Ignorance of the Laws does not constitute a valid excuse. The use of any illegal substances is prohibited. 10. Adopted CDC guidelines & recommendations: Target dosing levels will be at or below 60 MME/day. Use of benzodiazepines** is not recommended.  Exceptions: There are only two exceptions to the rule of not receiving pain medications from other Healthcare Providers. 1. Exception #1 (Emergencies): In the event of an emergency  (i.e.: accident requiring emergency care), you are allowed to receive additional pain medication. However, you are responsible for: As soon as you are able, call our office [phone], at any time of the day or night, and leave a message stating your name, the date and nature of the emergency, and the name and dose of the medication prescribed. In the event that your call is answered by a member of our staff, make sure to document and save the date, time, and the name of the person that took your information.  2. Exception #2 (Planned Surgery): In the event that you are scheduled by another doctor or dentist to have any type of surgery or procedure, you are allowed (for a period no longer than 30 days), to receive additional pain medication, for the acute post-op pain. However, in this case, you are responsible for picking up a copy of our "Post-op Pain Management for Surgeons" handout, and giving it to your surgeon or dentist. This document is available at our office, and does not require an appointment to obtain it. Simply go to our office during business hours (Monday-Thursday from 8:00 AM to 4:00 PM) (  Friday 8:00 AM to 12:00 Noon) or if you have a scheduled appointment with [place], prior to your surgery, and ask for it by name. In addition, you will need to provide [place] with your name, name of your surgeon, type of surgery, and date of procedure or surgery.  *Opioid medications include: morphine, codeine, oxycodone, oxymorphone, hydrocodone, hydromorphone, meperidine, tramadol, tapentadol, buprenorphine, fentanyl, methadone. **Benzodiazepine medications include: diazepam (Valium), alprazolam (Xanax), clonazepam (Klonopine), lorazepam (Ativan), clorazepate (Tranxene), chlordiazepoxide (Librium), estazolam (Prosom), oxazepam (Serax), temazepam (Restoril), triazolam (Halcion) (Last updated: 10/07/2017) ____________________________________________________________________________________________

## 2018-04-21 NOTE — Progress Notes (Signed)
Safety precautions to be maintained throughout the outpatient stay will include: orient to surroundings, keep bed in low position, maintain call bell within reach at all times, provide assistance with transfer out of bed and ambulation.  

## 2018-04-21 NOTE — Patient Instructions (Signed)
____________________________________________________________________________________________  Medication Rules  Applies to: All patients receiving prescriptions (written or electronic).  Pharmacy of record: Pharmacy where electronic prescriptions will be sent. If written prescriptions are taken to a different pharmacy, please inform the nursing staff. The pharmacy listed in the electronic medical record should be the one where you would like electronic prescriptions to be sent.  Prescription refills: Only during scheduled appointments. Applies to both, written and electronic prescriptions.  NOTE: The following applies primarily to controlled substances (Opioid* Pain Medications).   Patient's responsibilities: 1. Pain Pills: Bring all pain pills to every appointment (except for procedure appointments). 2. Pill Bottles: Bring pills in original pharmacy bottle. Always bring newest bottle. Bring bottle, even if empty. 3. Medication refills: You are responsible for knowing and keeping track of what medications you need refilled. The day before your appointment, write a list of all prescriptions that need to be refilled. Bring that list to your appointment and give it to the admitting nurse. Prescriptions will be written only during appointments. If you forget a medication, it will not be "Called in", "Faxed", or "electronically sent". You will need to get another appointment to get these prescribed. 4. Prescription Accuracy: You are responsible for carefully inspecting your prescriptions before leaving our office. Have the discharge nurse carefully go over each prescription with you, before taking them home. Make sure that your name is accurately spelled, that your address is correct. Check the name and dose of your medication to make sure it is accurate. Check the number of pills, and the written instructions to make sure they are clear and accurate. Make sure that you are given enough medication to last  until your next medication refill appointment. 5. Taking Medication: Take medication as prescribed. Never take more pills than instructed. Never take medication more frequently than prescribed. Taking less pills or less frequently is permitted and encouraged, when it comes to controlled substances (written prescriptions).  6. Inform other Doctors: Always inform, all of your healthcare providers, of all the medications you take. 7. Pain Medication from other Providers: You are not allowed to accept any additional pain medication from any other Doctor or Healthcare provider. There are two exceptions to this rule. (see below) In the event that you require additional pain medication, you are responsible for notifying us, as stated below. 8. Medication Agreement: You are responsible for carefully reading and following our Medication Agreement. This must be signed before receiving any prescriptions from our practice. Safely store a copy of your signed Agreement. Violations to the Agreement will result in no further prescriptions. (Additional copies of our Medication Agreement are available upon request.) 9. Laws, Rules, & Regulations: All patients are expected to follow all Federal and State Laws, Statutes, Rules, & Regulations. Ignorance of the Laws does not constitute a valid excuse. The use of any illegal substances is prohibited. 10. Adopted CDC guidelines & recommendations: Target dosing levels will be at or below 60 MME/day. Use of benzodiazepines** is not recommended.  Exceptions: There are only two exceptions to the rule of not receiving pain medications from other Healthcare Providers. 1. Exception #1 (Emergencies): In the event of an emergency (i.e.: accident requiring emergency care), you are allowed to receive additional pain medication. However, you are responsible for: As soon as you are able, call our office [phone], at any time of the day or night, and leave a message stating your name, the  date and nature of the emergency, and the name and dose of the medication   prescribed. In the event that your call is answered by a member of our staff, make sure to document and save the date, time, and the name of the person that took your information.  2. Exception #2 (Planned Surgery): In the event that you are scheduled by another doctor or dentist to have any type of surgery or procedure, you are allowed (for a period no longer than 30 days), to receive additional pain medication, for the acute post-op pain. However, in this case, you are responsible for picking up a copy of our "Post-op Pain Management for Surgeons" handout, and giving it to your surgeon or dentist. This document is available at our office, and does not require an appointment to obtain it. Simply go to our office during business hours (Monday-Thursday from 8:00 AM to 4:00 PM) (Friday 8:00 AM to 12:00 Noon) or if you have a scheduled appointment with us, prior to your surgery, and ask for it by name. In addition, you will need to provide us with your name, name of your surgeon, type of surgery, and date of procedure or surgery.  *Opioid medications include: morphine, codeine, oxycodone, oxymorphone, hydrocodone, hydromorphone, meperidine, tramadol, tapentadol, buprenorphine, fentanyl, methadone. **Benzodiazepine medications include: diazepam (Valium), alprazolam (Xanax), clonazepam (Klonopine), lorazepam (Ativan), clorazepate (Tranxene), chlordiazepoxide (Librium), estazolam (Prosom), oxazepam (Serax), temazepam (Restoril), triazolam (Halcion) (Last updated: 10/07/2017) ____________________________________________________________________________________________    

## 2018-04-27 ENCOUNTER — Ambulatory Visit: Payer: Medicare Other

## 2018-04-27 DIAGNOSIS — G8929 Other chronic pain: Secondary | ICD-10-CM

## 2018-04-27 DIAGNOSIS — R262 Difficulty in walking, not elsewhere classified: Secondary | ICD-10-CM

## 2018-04-27 DIAGNOSIS — M6281 Muscle weakness (generalized): Secondary | ICD-10-CM

## 2018-04-27 DIAGNOSIS — M5442 Lumbago with sciatica, left side: Secondary | ICD-10-CM | POA: Diagnosis not present

## 2018-04-27 DIAGNOSIS — M5417 Radiculopathy, lumbosacral region: Secondary | ICD-10-CM | POA: Diagnosis not present

## 2018-04-27 DIAGNOSIS — M5441 Lumbago with sciatica, right side: Secondary | ICD-10-CM | POA: Diagnosis not present

## 2018-04-27 NOTE — Therapy (Signed)
[facility] [address] [place], [address] Phone: [phone]   Fax:  [phone]  Physical Therapy Treatment  Patient Details  Name: Sarah Donaldson MRN: [id number] Date of Birth: [birth year] Referring Provider: Gurney Maxin, MD   Encounter Date: 04/27/2018  PT End of Session - 04/27/18 1306    Visit Number  7    Number of Visits  17    Date for PT Re-Evaluation  05/19/18    Authorization Type  7    Authorization Time Period  of 10 progress report    PT Start Time  7824    PT Stop Time  2353    PT Time Calculation (min)  41 min    Equipment Utilized During Treatment  Gait belt   pt rollator   Activity Tolerance  Patient limited by pain;Patient tolerated treatment well    Behavior During Therapy  Restless;Impulsive;WFL for tasks assessed/performed       Past Medical History:  Diagnosis Date  . Cancer ([place])    Non-hodgkin's lymphoma (in remission)  . Knee joint cyst, left 02/07/2018  . Parkinson disease ([place])   . Parkinson disease [facility])     Past Surgical History:  Procedure Laterality Date  . BACK SURGERY    . BUNIONECTOMY    . NASAL SEPTUM SURGERY    . RETINAL DETACHMENT SURGERY      There were no vitals filed for this visit.  Subjective Assessment - 04/27/18 1311    Subjective  Back is sore at low back. Has been doing a lot of crafts. 4/10 back soreness currently. Pt husband also states that pt had a doctors appointment for her L knee recently.     Pertinent History  Chronic back pain. Has had Parkinsons since she was 67 years old. Has had back pain for years. Worsened around 2 years ago and feels cramping as well as pins and needles R medial and lateral thigh as well as L thigh.  Pt states that [name] increased last week. Difficulty bending her L leg and to kneel due to pain. Wears a knee brace to help with the swelling.   Pt states that when she walks on her L LE and steps on it,  it gives way which started around the past 2 days.   Pt states that her back pain has gotten worse since 2 years ago.  Walking makes her legs feel heavy. Pt uses her SPC sporadically when her legs bother her.  Denies loss of bowel or bladder control.  Pt unsure if she has tingling or numness bilateral inner thigh.  Pt also states having Non-Hodgkins lymphoma cancer.   Pt states tinlging and numbness bilateral posterior LE going down to her feet.   Pt also states falling 3 times within the past 6 months.  Pt tripped on a stool, pt also was running and in a hurry, did not watch where she was going and tripped on a basket. Pt also fell off the bed when she was sleeping.     Back pain predominantly R posterior pelvis area.    Patient Stated Goals  Be able to stand up straighter.     Currently in Pain?  Yes    Pain Score  4    soreness                              PT  Education - 04/27/18 1316    Education Details  ther-ex, HEP    Person(s) Educated  Patient    Methods  Explanation;Demonstration;Tactile cues;Verbal cues    Comprehension  Returned demonstration;Verbalized understanding       Objectives  Hx of back surgery with screws November 2018.  Next appt with Dr. Holley Raring is around June 02, 2018.   Impulsive (observation)  Pt utilized her rollator walker today.  Gait: pt uses rollator with L lateral lean.Possible stress to L knee.  Therapeutic exercise    Sitting on regular chair:  Seated manually resisted R trunk side bend isometrics on chairwith back supported10x5 seconds for 3 sets to help decrease L lateral lean   Then 10x5 seconds with husband. Reviewed and given as part of HEP. Both demonstrated and verbalized understanding.   Seated manually resisted R trunk rotation isometrics to promotebetterposture 10x5 seconds for 2 sets   Seated manually resisted trunk flexion isometrics with PT 10x5 seconds for 3 sets   Seated bilateral  scapular retraction 10x5 seconds for 3 sets   Sit <> stand from elevated mat table 24 inches high 10x2 with glute max squeeze              Seated hip abduction isometrics resisting strap at thighs 10x5 seconds, hips less than 90 degrees flexion for 3 sets   Seated bilateral ankle DF/PF 10x2 each direction    Improved exercise technique, movement at target joints, use of target muscles after mod verbal, visual, tactile cues.    Consistent cues needed to decrease L lateral shift/lean as well as to decrease L trunk rotation. Decreased back and LE symptoms with improved posture. Continued working on quadriceps and glute strengthening to promote femoral control as well as ability to perform standing tasks with less back pain. Pt tolerated session well without aggravation of symptoms. Pt will benefit from continued skilled physical therapy services to decrease pain, improve strength and function.      PT Short Term Goals - 03/24/18 1603      PT SHORT TERM GOAL #1   Title  Patient will be independent with her HEP to promote trunk and LE strength, and to help decrease back and bilateral LE pain.     Baseline  --    Time  3    Period  Weeks    Status  New    Target Date  04/14/18        PT Long Term Goals - 03/24/18 2146      PT LONG TERM GOAL #1   Title  Patient will have a decrease in back pain to 5/10 or less at worst to promote ability to ambulate, peform standing tasks more comfortably.     Baseline  10/10 back pain at worst for the past 3 months. (03/24/2018)    Time  8    Period  Weeks    Status  New    Target Date  05/19/18      PT LONG TERM GOAL #2   Title  Patient will improve her lumbar spine FOTO score by at least 10 points as a demonstration of improved function.     Baseline  Lumbar spine FOTO: 37 (03/24/2018)    Time  8    Period  Weeks    Status  New    Target Date  05/19/18      PT LONG TERM GOAL #3   Title  Pt will improve bilateral hip abduction and  extension strength  by at least 1/2 MMT grade to promote ability to perform standing tasks with less back pain.     Time  8    Period  Weeks    Status  New    Target Date  05/19/18            Plan - 04/27/18 1306    Clinical Impression Statement  Consistent cues needed to decrease L lateral shift/lean as well as to decrease L trunk rotation. Decreased back and LE symptoms with improved posture. Continued working on quadriceps and glute strengthening to promote femoral control as well as ability to perform standing tasks with less back pain. Pt tolerated session well without aggravation of symptoms. Pt will benefit from continued skilled physical therapy services to decrease pain, improve strength and function.     Rehab Potential  Fair    Clinical Impairments Affecting Rehab Potential  (-) Chronicity of back pain, B LE pain, Parkinson's, age; (+) motivated    PT Frequency  2x / week    PT Duration  8 weeks    PT Treatment/Interventions  Therapeutic activities;Therapeutic exercise;Balance training;Neuromuscular re-education;Patient/family education;Manual techniques;Dry needling;Aquatic Therapy;Electrical Stimulation;Iontophoresis 4mg /ml Dexamethasone;Gait training    PT Next Visit Plan  trunk, hip strengthening, lumbopelvic control, gait training, manual techniques, modalities PRN    Consulted and Agree with Plan of Care  Patient;Family member/caregiver    Family Member Consulted  husband       Patient will benefit from skilled therapeutic intervention in order to improve the following deficits and impairments:  Pain, Improper body mechanics, Postural dysfunction, Decreased strength, Difficulty walking, Decreased safety awareness, Decreased balance, Abnormal gait  Visit Diagnosis: Chronic bilateral low back pain with bilateral sciatica  Radiculopathy, lumbosacral region  Muscle weakness (generalized)  Difficulty in walking, not elsewhere classified     Problem List Patient  Active Problem List   Diagnosis Date Noted  . Numbness 03/09/2018  . Lumbar spondylosis 03/07/2018  . Chronic pain of left knee 03/07/2018  . Chronic upper back pain 12/08/2017  . Benign neoplasm of hand 10/20/2017  . Chronic pain syndrome 10/20/2017  . Chronic pain of right lower extremity 10/20/2017  . Chronic myofascial pain 10/20/2017  . Numbness and tingling 07/12/2017  . Sleep difficulties 07/12/2017  . Low back pain 01/18/2017  . Sleep behavior disorder, REM 01/18/2017  . Lumbar radiculopathy 11/01/2016  . 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 Lymphoma ([place]) 04/30/2016  . Hallux limitus 07/27/2015  . Low back pain with sciatica 02/13/2015  . Bilateral low back pain with sciatica 02/13/2015  . H/O adenomatous polyp of colon 04/06/2014  . Hx of adenomatous colonic polyps 04/06/2014  . Back ache 01/25/2014  . Adiposity 01/25/2014  . REM sleep behavior disorder 01/25/2014  . Disordered sleep 01/25/2014  . Has a tremor 01/25/2014  . Obesity, unspecified 01/25/2014  . Sleep disorder 01/25/2014  . Backache 01/25/2014  . Tremor 01/25/2014  . Obesity 01/25/2014  . Difficulty hearing 06/08/2012  . Hearing loss 06/08/2012  . Anxiety 06/06/2012  . Colon polyp 06/06/2012  . Clinical depression 06/06/2012  . Hypercholesteremia 06/06/2012  . 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 Detached retina 06/06/2012  . Depression 06/06/2012  . Retinal detachment 06/06/2012  . Parkinson's disease ([place]) 06/06/2012    Joneen Boers PT, DPT   04/27/2018, 8:09 PM  [facility] [address] [place], [address] Phone: [phone]   Fax:  [phone]  Name: LORAH KALINA MRN: [id number] Date of Birth: February 11, 1951

## 2018-04-27 NOTE — Patient Instructions (Signed)
Seated manually resisted R trunk side bend isometrics on chairwith back supported10x5 seconds for 3 sets to help decrease L lateral lean   Reviewed and given as part of HEP. Both pt and husband demonstrated and verbalized understanding.

## 2018-05-10 ENCOUNTER — Ambulatory Visit
Admission: RE | Admit: 2018-05-10 | Discharge: 2018-05-10 | Disposition: A | Discharge status: Home / Self Care | Payer: Medicare Other | Source: Ambulatory Visit | Attending: Internal Medicine | Admitting: Internal Medicine

## 2018-05-10 DIAGNOSIS — Z1231 Encounter for screening mammogram for malignant neoplasm of breast: Secondary | ICD-10-CM | POA: Diagnosis not present

## 2018-05-12 ENCOUNTER — Ambulatory Visit: Payer: Medicare Other | Attending: Nurse Practitioner

## 2018-05-12 DIAGNOSIS — R262 Difficulty in walking, not elsewhere classified: Secondary | ICD-10-CM | POA: Insufficient documentation

## 2018-05-12 DIAGNOSIS — M5441 Lumbago with sciatica, right side: Secondary | ICD-10-CM | POA: Insufficient documentation

## 2018-05-12 DIAGNOSIS — G8929 Other chronic pain: Secondary | ICD-10-CM

## 2018-05-12 DIAGNOSIS — M6281 Muscle weakness (generalized): Secondary | ICD-10-CM | POA: Insufficient documentation

## 2018-05-12 DIAGNOSIS — M5442 Lumbago with sciatica, left side: Secondary | ICD-10-CM | POA: Diagnosis not present

## 2018-05-12 DIAGNOSIS — M5417 Radiculopathy, lumbosacral region: Secondary | ICD-10-CM | POA: Insufficient documentation

## 2018-05-12 NOTE — Therapy (Signed)
[facility] [address] [place], [address] Phone: [phone]   Fax:  [phone]  Physical Therapy Treatment  Patient Details  Name: Sarah Donaldson MRN: [id number] Date of Birth: July 25, 1951 Referring Provider (PT): Gurney Maxin, MD   Encounter Date: 05/12/2018  PT End of Session - 05/12/18 1411    Visit Number  8    Number of Visits  17    Date for PT Re-Evaluation  05/19/18    Authorization Type  8    Authorization Time Period  of 10 progress report    PT Start Time  1400   pt arrives late    PT Stop Time  1426    PT Time Calculation (min)  26 min    Equipment Utilized During Treatment  Gait belt    Activity Tolerance  Patient limited by pain;Patient tolerated treatment well    Behavior During Therapy  Restless;Impulsive;WFL for tasks assessed/performed       Past Medical History:  Diagnosis Date  . Cancer ([place])    Non-hodgkin's lymphoma (in remission)  . Knee joint cyst, left 02/07/2018  . Parkinson disease ([place])   . Parkinson disease [facility])     Past Surgical History:  Procedure Laterality Date  . BACK SURGERY    . BUNIONECTOMY    . NASAL SEPTUM SURGERY    . RETINAL DETACHMENT SURGERY      There were no vitals filed for this visit.  Subjective Assessment - 05/12/18 1403    Subjective  Pt reports her back is aggravated after being in the car to go to the beach. Back remains flared to 6/10.     Pertinent History  Chronic back pain. Has had Parkinsons since she was 67 years old. Has had back pain for years. Worsened around 2 years ago and feels cramping as well as pins and needles R medial and lateral thigh as well as L thigh.  Pt states that [name] increased last week. Difficulty bending her L leg and to kneel due to pain. Wears a knee brace to help with the swelling.   Pt states that when she walks on her L LE and steps on it, it gives way which started around the past 2 days.    Pt states that her back pain has gotten worse since 2 years ago.  Walking makes her legs feel heavy. Pt uses her SPC sporadically when her legs bother her.  Denies loss of bowel or bladder control.  Pt unsure if she has tingling or numness bilateral inner thigh.  Pt also states having Non-Hodgkins lymphoma cancer.   Pt states tinlging and numbness bilateral posterior LE going down to her feet.   Pt also states falling 3 times within the past 6 months.  Pt tripped on a stool, pt also was running and in a hurry, did not watch where she was going and tripped on a basket. Pt also fell off the bed when she was sleeping.      Currently in Pain?  Yes    Pain Score  6     Pain Location  Back        Therapeutic exercise   Sitting on regular chair:  -Seated manually resisted R trunk side bend (short arc) on chair with back supported 15x5 seconds for 3 sets to help decrease L lateral lean  -Seated manually resisted R trunk rotation isometrics to promote better posture 15x3 seconds for 2  sets (pt reports pins and needling sensation in RLE c each repetition) -Seated manually resisted trunk flexion isometrics with PT 15x5 seconds for 3 sets  -Seated bilateral scapular retraction 10x5 seconds for 3 sets -Seated trunk stabilization2x20bilat in chair (on airex pad)    -Sit <> stand from elevated mat table 22 inches high 2x20 with glute max squeeze         PT Short Term Goals - 03/24/18 1603      PT SHORT TERM GOAL #1   Title  Patient will be independent with her HEP to promote trunk and LE strength, and to help decrease back and bilateral LE pain.     Baseline  --    Time  3    Period  Weeks    Status  New    Target Date  04/14/18        PT Long Term Goals - 03/24/18 2146      PT LONG TERM GOAL #1   Title  Patient will have a decrease in back pain to 5/10 or less at worst to promote ability to ambulate, peform standing tasks more comfortably.     Baseline  10/10 back pain at worst for the past  3 months. (03/24/2018)    Time  8    Period  Weeks    Status  New    Target Date  05/19/18      PT LONG TERM GOAL #2   Title  Patient will improve her lumbar spine FOTO score by at least 10 points as a demonstration of improved function.     Baseline  Lumbar spine FOTO: 37 (03/24/2018)    Time  8    Period  Weeks    Status  New    Target Date  05/19/18      PT LONG TERM GOAL #3   Title  Pt will improve bilateral hip abduction and extension strength by at least 1/2 MMT grade to promote ability to perform standing tasks with less back pain.     Time  8    Period  Weeks    Status  New    Target Date  05/19/18            Plan - 05/12/18 1412    Clinical Impression Statement  Pt arrived late for session., Continued with trunk isometrics. Intermittent tingling in RLE throughout. Pain remains well controlled throughout session.  Pt requires some cueing intermittently to assure correct form, but has difficulty attending to task for counting reps and hold times.     Rehab Potential  Fair    Clinical Impairments Affecting Rehab Potential  (-) Chronicity of back pain, B LE pain, Parkinson's, age; (+) motivated    PT Frequency  2x / week    PT Duration  8 weeks    PT Treatment/Interventions  Therapeutic activities;Therapeutic exercise;Balance training;Neuromuscular re-education;Patient/family education;Manual techniques;Dry needling;Aquatic Therapy;Electrical Stimulation;Iontophoresis 4mg /ml Dexamethasone;Gait training    PT Next Visit Plan  trunk, hip strengthening, lumbopelvic control, gait training, manual techniques, modalities PRN    Consulted and Agree with Plan of Care  Patient;Family member/caregiver    Family Member Consulted  husband       Patient will benefit from skilled therapeutic intervention in order to improve the following deficits and impairments:  Pain, Improper body mechanics, Postural dysfunction, Decreased strength, Difficulty walking, Decreased safety awareness,  Decreased balance, Abnormal gait  Visit Diagnosis: Chronic bilateral low back pain with bilateral sciatica  Radiculopathy, lumbosacral region  Muscle weakness (generalized)  Difficulty in walking, not elsewhere classified     Problem List Patient Active Problem List   Diagnosis Date Noted  . Numbness 03/09/2018  . Lumbar spondylosis 03/07/2018  . Chronic pain of left knee 03/07/2018  . Chronic upper back pain 12/08/2017  . Benign neoplasm of hand 10/20/2017  . Chronic pain syndrome 10/20/2017  . Chronic pain of right lower extremity 10/20/2017  . Chronic myofascial pain 10/20/2017  . Numbness and tingling 07/12/2017  . Sleep difficulties 07/12/2017  . Low back pain 01/18/2017  . Sleep behavior disorder, REM 01/18/2017  . Lumbar radiculopathy 11/01/2016  . Follicular lymphoma of extranodal and solid organ sites [facility]) 05/01/2016  . Lymphoma ([place]) 04/30/2016  . Hallux limitus 07/27/2015  . Low back pain with sciatica 02/13/2015  . Bilateral low back pain with sciatica 02/13/2015  . H/O adenomatous polyp of colon 04/06/2014  . Hx of adenomatous colonic polyps 04/06/2014  . Back ache 01/25/2014  . Adiposity 01/25/2014  . REM sleep behavior disorder 01/25/2014  . Disordered sleep 01/25/2014  . Has a tremor 01/25/2014  . Obesity, unspecified 01/25/2014  . Sleep disorder 01/25/2014  . Backache 01/25/2014  . Tremor 01/25/2014  . Obesity 01/25/2014  . Difficulty hearing 06/08/2012  . Hearing loss 06/08/2012  . Anxiety 06/06/2012  . Colon polyp 06/06/2012  . Clinical depression 06/06/2012  . Hypercholesteremia 06/06/2012  . Idiopathic Parkinson's disease ([place]) 06/06/2012  . Detached retina 06/06/2012  . Depression 06/06/2012  . Retinal detachment 06/06/2012  . Parkinson's disease ([place]) 06/06/2012  2:25 PM, 05/12/18 Etta Grandchild, PT, DPT Physical Therapist - [place] [phone] (Office)    Etta Grandchild 05/12/2018, 2:21 PM  [facility] [address], [place], [address] Phone: [phone]   Fax:  [phone]  Name: Sarah Donaldson MRN: [id number] Date of Birth: 1951-02-02

## 2018-05-16 ENCOUNTER — Ambulatory Visit: Payer: Medicare Other

## 2018-05-16 DIAGNOSIS — M5441 Lumbago with sciatica, right side: Principal | ICD-10-CM

## 2018-05-16 DIAGNOSIS — R262 Difficulty in walking, not elsewhere classified: Secondary | ICD-10-CM

## 2018-05-16 DIAGNOSIS — M5442 Lumbago with sciatica, left side: Principal | ICD-10-CM

## 2018-05-16 DIAGNOSIS — M6281 Muscle weakness (generalized): Secondary | ICD-10-CM

## 2018-05-16 DIAGNOSIS — M5417 Radiculopathy, lumbosacral region: Secondary | ICD-10-CM

## 2018-05-16 DIAGNOSIS — G8929 Other chronic pain: Secondary | ICD-10-CM

## 2018-05-16 NOTE — Therapy (Signed)
[facility] [address] [place], [address] Phone: [phone]   Fax:  [phone]  Physical Therapy Treatment And Progress Report   Patient Details  Name: Sarah Donaldson MRN: [id number] Date of Birth: 01-25-1951 Referring Provider (PT): Gurney Maxin, MD   Encounter Date: 05/16/2018  PT End of Session - 05/16/18 1117    Visit Number  9    Number of Visits  29    Date for PT Re-Evaluation  06/30/18    Authorization Type  9    Authorization Time Period  of 10 progress report    PT Start Time  1117    PT Stop Time  1159    PT Time Calculation (min)  42 min    Equipment Utilized During Treatment  Gait belt    Activity Tolerance  Patient limited by pain;Patient tolerated treatment well    Behavior During Therapy  Restless;Impulsive;WFL for tasks assessed/performed       Past Medical History:  Diagnosis Date  . Cancer ([place])    Non-hodgkin's lymphoma (in remission)  . Knee joint cyst, left 02/07/2018  . Parkinson disease ([place])   . Parkinson disease [facility])     Past Surgical History:  Procedure Laterality Date  . BACK SURGERY    . BUNIONECTOMY    . NASAL SEPTUM SURGERY    . RETINAL DETACHMENT SURGERY      There were no vitals filed for this visit.  Subjective Assessment - 05/16/18 1118    Subjective  The beach trip was nice. Fell in the ocean and had to be helped by people.  Back is a little sore but not as bad as usual. Hurts when she goes to bed. Feels her back pain more in the evening if she has been on her feet all day.  9/10 back pain at worst at night during the past 7 days, 4-5/10 during the day.  Pt states that the day time back pain is better since starting PT. The night time still bothers her.  Better able to do more things during the day than before such as cooking.     Pertinent History  Chronic back pain. Has had Parkinsons since she was 67 years old. Has had back pain for years.  Worsened around 2 years ago and feels cramping as well as pins and needles R medial and lateral thigh as well as L thigh.  Pt states that [name] increased last week. Difficulty bending her L leg and to kneel due to pain. Wears a knee brace to help with the swelling.   Pt states that when she walks on her L LE and steps on it, it gives way which started around the past 2 days.   Pt states that her back pain has gotten worse since 2 years ago.  Walking makes her legs feel heavy. Pt uses her SPC sporadically when her legs bother her.  Denies loss of bowel or bladder control.  Pt unsure if she has tingling or numness bilateral inner thigh.  Pt also states having Non-Hodgkins lymphoma cancer.   Pt states tinlging and numbness bilateral posterior LE going down to her feet.   Pt also states falling 3 times within the past 6 months.  Pt tripped on a stool, pt also was running and in a hurry, did not watch where she was going and tripped on a basket. Pt also fell off the bed when she was sleeping.  Currently in Pain?  Yes    Pain Score  4          [facility] PT Assessment - 05/16/18 1122      Observation/Other Assessments   Focus on Therapeutic Outcomes (FOTO)   lumbar spine FOTO: 54      Strength   Right Hip Extension  4+/5   seated hip extension isometrics   Right Hip ABduction  4/5   seated clamshell isometrics   Left Hip Extension  4/5   seated hip extension isometrics   Left Hip ABduction  4/5   seated clamshell isometrics                          PT Education - 05/16/18 1207    Education Details  ther-ex. plan pf care    Person(s) Educated  Patient;Spouse    Methods  Explanation;Demonstration;Tactile cues;Verbal cues    Comprehension  Returned demonstration;Verbalized understanding       Objectives  Better able to sit still today observed. Pt states she took her medicine  Therapeutic exercise   Directed patient with seated manually resisted hip  extension and clamshell isometrics 1-2x each way for each LE.   Reviewed progress/current status with hip strength with pt.   Sitting with back unsupported, hips less than 90 degrees flexion  Clamshell isometrics 10x5 seconds for 3 sets  Seated manually resisted R trunk side bend isometrics on mat table with back unsupported10x5 seconds for 3 sets to help decrease L lateral lean   Sit <> stand from elevated mat table 23 inches high 10x2 with glute max squeeze   Standing hip abduction with bilateral UE assist 10x3 each LE. Good glute med muscle use felt.   Standing glute max squeeze with bilateral UE assist 10x10 seconds  R L4 dermatome pins and needles felt which eases with rest per pt.   Reviewed plan of care: 2x/week for 6 weeks.   standing bilateral scapular retraction 10x3 with bilateral UE assist   Standing heel toe raises with bilateral UE assist 10x3 each direction   Improved exercise technique, movement at target joints, use of target muscles after min to mod verbal, visual, tactile cues.   Pt demonstrates overall improved glute med and max strength, overall decreased back pain during the day and improved function at home with pt stating that she is better able to stand up and cook based on pt subjective reports. Pt also improving ability to decrease L lateral shift posture based on today's session. Pt still demonstrates back and LE pain, trunk and hip weakness, decreased lumbopelvic control, and difficulty performing functional tasks and would benefit from continued skilled physical therapy services to address the aforementioned deficits.        PT Short Term Goals - 05/16/18 1224      PT SHORT TERM GOAL #1   Title  Patient will be independent with her HEP to promote trunk and LE strength, and to help decrease back and bilateral LE pain.     Time  3    Period  Weeks    Status  On-going    Target Date  06/09/18        PT Long Term Goals - 05/16/18 1224       PT LONG TERM GOAL #1   Title  Patient will have a decrease in back pain to 5/10 or less at worst to promote ability to ambulate, peform standing tasks more comfortably.  Baseline  10/10 back pain at worst for the past 3 months. (03/24/2018); 9/10 at worst at night, 4-5/10 during the daytime (05/16/2018)    Time  6    Period  Weeks    Status  On-going    Target Date  06/30/18      PT LONG TERM GOAL #2   Title  Patient will improve her lumbar spine FOTO score by at least 10 points as a demonstration of improved function.     Baseline  Lumbar spine FOTO: 37 (03/24/2018); 54 (05/16/2018)    Time  6    Period  Weeks    Status  Achieved    Target Date  06/30/18      PT LONG TERM GOAL #3   Title  Pt will improve bilateral hip abduction and extension strength by at least 1/2 MMT grade to promote ability to perform standing tasks with less back pain.     Time  6    Period  Weeks    Status  Partially Met    Target Date  06/30/18            Plan - 05/16/18 1213    Clinical Impression Statement  Pt demonstrates overall improved glute med and max strength, overall decreased back pain during the day and improved function at home with pt stating that she is better able to stand up and cook based on pt subjective reports. Pt also improving ability to decrease L lateral shift posture based on today's session. Pt still demonstrates back and LE pain, trunk and hip weakness, decreased lumbopelvic control, and difficulty performing functional tasks and would benefit from continued skilled physical therapy services to address the aforementioned deficits.     History and Personal Factors relevant to plan of care:  medical history, chronicity of back and LE pain, parkinson's, L knee pain, weakness, difficulty walking, difficulty moving around, fall history. Husband support at home.      Clinical Presentation  Stable    Clinical Presentation due to:  Pt making progress with PT towards goals.     Clinical  Decision Making  Low    Rehab Potential  Fair    Clinical Impairments Affecting Rehab Potential  (-) Chronicity of back pain, B LE pain, Parkinson's, age; (+) motivated    PT Frequency  2x / week    PT Duration  6 weeks    PT Treatment/Interventions  Therapeutic activities;Therapeutic exercise;Balance training;Neuromuscular re-education;Patient/family education;Manual techniques;Dry needling;Aquatic Therapy;Electrical Stimulation;Iontophoresis 20m/ml Dexamethasone;Gait training    PT Next Visit Plan  trunk, hip strengthening, lumbopelvic control, gait training, manual techniques, modalities PRN    Consulted and Agree with Plan of Care  Patient;Family member/caregiver    Family Member Consulted  husband       Patient will benefit from skilled therapeutic intervention in order to improve the following deficits and impairments:  Pain, Improper body mechanics, Postural dysfunction, Decreased strength, Difficulty walking, Decreased safety awareness, Decreased balance, Abnormal gait  Visit Diagnosis: Chronic bilateral low back pain with bilateral sciatica - Plan: PT plan of care cert/re-cert  Radiculopathy, lumbosacral region - Plan: PT plan of care cert/re-cert  Muscle weakness (generalized) - Plan: PT plan of care cert/re-cert  Difficulty in walking, not elsewhere classified - Plan: PT plan of care cert/re-cert     Problem List Patient Active Problem List   Diagnosis Date Noted  . Numbness 03/09/2018  . Lumbar spondylosis 03/07/2018  . Chronic pain of left knee 03/07/2018  . Chronic upper  back pain 12/08/2017  . Benign neoplasm of hand 10/20/2017  . Chronic pain syndrome 10/20/2017  . Chronic pain of right lower extremity 10/20/2017  . Chronic myofascial pain 10/20/2017  . Numbness and tingling 07/12/2017  . Sleep difficulties 07/12/2017  . Low back pain 01/18/2017  . Sleep behavior disorder, REM 01/18/2017  . Lumbar radiculopathy 11/01/2016  . Follicular lymphoma of extranodal  and solid organ sites [facility]) 05/01/2016  . Lymphoma ([place]) 04/30/2016  . Hallux limitus 07/27/2015  . Low back pain with sciatica 02/13/2015  . Bilateral low back pain with sciatica 02/13/2015  . H/O adenomatous polyp of colon 04/06/2014  . Hx of adenomatous colonic polyps 04/06/2014  . Back ache 01/25/2014  . Adiposity 01/25/2014  . REM sleep behavior disorder 01/25/2014  . Disordered sleep 01/25/2014  . Has a tremor 01/25/2014  . Obesity, unspecified 01/25/2014  . Sleep disorder 01/25/2014  . Backache 01/25/2014  . Tremor 01/25/2014  . Obesity 01/25/2014  . Difficulty hearing 06/08/2012  . Hearing loss 06/08/2012  . Anxiety 06/06/2012  . Colon polyp 06/06/2012  . Clinical depression 06/06/2012  . Hypercholesteremia 06/06/2012  . Idiopathic Parkinson's disease ([place]) 06/06/2012  . Detached retina 06/06/2012  . Depression 06/06/2012  . Retinal detachment 06/06/2012  . Parkinson's disease ([place]) 06/06/2012   Thank you for your referral.  Joneen Boers PT, DPT   05/16/2018, 12:29 PM  [facility] [address] [place], [address] Phone: [phone]   Fax:  [phone]  Name: CLARABELLE OSCARSON MRN: [id number] Date of Birth: January 17, 1951

## 2018-05-17 ENCOUNTER — Other Ambulatory Visit: Payer: Self-pay | Admitting: Internal Medicine

## 2018-05-17 DIAGNOSIS — R928 Other abnormal and inconclusive findings on diagnostic imaging of breast: Secondary | ICD-10-CM

## 2018-05-18 ENCOUNTER — Ambulatory Visit
Admission: RE | Admit: 2018-05-18 | Discharge: 2018-05-18 | Disposition: A | Discharge status: Home / Self Care | Payer: Medicare Other | Source: Ambulatory Visit | Attending: Internal Medicine | Admitting: Internal Medicine

## 2018-05-18 ENCOUNTER — Ambulatory Visit: Payer: Medicare Other

## 2018-05-18 DIAGNOSIS — C8299 Follicular lymphoma, unspecified, extranodal and solid organ sites: Secondary | ICD-10-CM | POA: Diagnosis not present

## 2018-05-18 DIAGNOSIS — R161 Splenomegaly, not elsewhere classified: Secondary | ICD-10-CM | POA: Insufficient documentation

## 2018-05-18 DIAGNOSIS — M5441 Lumbago with sciatica, right side: Principal | ICD-10-CM

## 2018-05-18 DIAGNOSIS — M6281 Muscle weakness (generalized): Secondary | ICD-10-CM | POA: Diagnosis not present

## 2018-05-18 DIAGNOSIS — K449 Diaphragmatic hernia without obstruction or gangrene: Secondary | ICD-10-CM | POA: Diagnosis not present

## 2018-05-18 DIAGNOSIS — R262 Difficulty in walking, not elsewhere classified: Secondary | ICD-10-CM

## 2018-05-18 DIAGNOSIS — M5442 Lumbago with sciatica, left side: Principal | ICD-10-CM

## 2018-05-18 DIAGNOSIS — M5417 Radiculopathy, lumbosacral region: Secondary | ICD-10-CM | POA: Diagnosis not present

## 2018-05-18 DIAGNOSIS — E049 Nontoxic goiter, unspecified: Secondary | ICD-10-CM | POA: Insufficient documentation

## 2018-05-18 DIAGNOSIS — C8290 Follicular lymphoma, unspecified, unspecified site: Secondary | ICD-10-CM | POA: Diagnosis not present

## 2018-05-18 DIAGNOSIS — G8929 Other chronic pain: Secondary | ICD-10-CM

## 2018-05-18 LAB — GLUCOSE, CAPILLARY: Glucose-Capillary: 91 mg/dL (ref 70–99)

## 2018-05-18 MED ORDER — FLUDEOXYGLUCOSE F - 18 (FDG) INJECTION
8.2000 | Freq: Once | INTRAVENOUS | Status: AC | PRN
  Administered 2018-05-18: 8.2 via INTRAVENOUS

## 2018-05-18 NOTE — Therapy (Signed)
[facility] [address] [place], [address] Phone: [phone]   Fax:  [phone]  Physical Therapy Treatment  Patient Details  Name: Sarah Donaldson MRN: [id number] Date of Birth: September 10, 1950 Referring Provider (PT): Gurney Maxin, MD   Encounter Date: 05/18/2018  PT End of Session - 05/18/18 1112    Visit Number  10    Number of Visits  29    Date for PT Re-Evaluation  06/30/18    Authorization Type  1    Authorization Time Period  of 10 progress report    PT Start Time  1113    PT Stop Time  1130    PT Time Calculation (min)  17 min    Equipment Utilized During Treatment  Gait belt    Activity Tolerance  Patient limited by pain;Patient tolerated treatment well    Behavior During Therapy  Restless;Impulsive;WFL for tasks assessed/performed       Past Medical History:  Diagnosis Date  . Cancer ([place])    Non-hodgkin's lymphoma (in remission)  . Knee joint cyst, left 02/07/2018  . Parkinson disease ([place])   . Parkinson disease [facility])     Past Surgical History:  Procedure Laterality Date  . BACK SURGERY    . BUNIONECTOMY    . NASAL SEPTUM SURGERY    . RETINAL DETACHMENT SURGERY      There were no vitals filed for this visit.  Subjective Assessment - 05/18/18 1117    Subjective  Pt states being sore at her back from last session. Also had a PET scan this morning. 6/10 back pain currently. Took her Parkinson's medicine this morning and feels groggy from it and it makes her slurr her words.     Pertinent History  Chronic back pain. Has had Parkinsons since she was 67 years old. Has had back pain for years. Worsened around 2 years ago and feels cramping as well as pins and needles R medial and lateral thigh as well as L thigh.  Pt states that [name] increased last week. Difficulty bending her L leg and to kneel due to pain. Wears a knee brace to help with the swelling.   Pt states that when  she walks on her L LE and steps on it, it gives way which started around the past 2 days.   Pt states that her back pain has gotten worse since 2 years ago.  Walking makes her legs feel heavy. Pt uses her SPC sporadically when her legs bother her.  Denies loss of bowel or bladder control.  Pt unsure if she has tingling or numness bilateral inner thigh.  Pt also states having Non-Hodgkins lymphoma cancer.   Pt states tinlging and numbness bilateral posterior LE going down to her feet.   Pt also states falling 3 times within the past 6 months.  Pt tripped on a stool, pt also was running and in a hurry, did not watch where she was going and tripped on a basket. Pt also fell off the bed when she was sleeping.      Currently in Pain?  Yes    Pain Score  6                                PT Education - 05/18/18 1130    Education Details  ther-ex    [employer]) Educated  Patient  Methods  Explanation;Demonstration;Tactile cues;Verbal cues    Comprehension  Returned demonstration;Verbalized understanding      Objectives     Therapeutic exercise    Seated manually resisted R trunk side bend isometrics at chair with back supported10x 10 secondsto help decrease L lateral lean  Sitting unsupported  Manually resisted trunk flexion isometrics 10x5 seconds for 2 sets   Seated bilateral scapular retraction 10x5 seconds to promote thoracic extension and help decrease lumbar extension.    Improved exercise technique, movement at target joints, use of target muscles after mod verbal, visual, tactile cues.   Continued working on decreasing L lateral shift posture, and improving thoracic extension and trunk strength to decrease pressure to low back. Light session today secondary to fatigue and sleepiness from the side effect of her Parkinson's medication. Pt tolerated session well without aggravation of symptoms.           PT Short Term Goals - 05/16/18 1224       PT SHORT TERM GOAL #1   Title  Patient will be independent with her HEP to promote trunk and LE strength, and to help decrease back and bilateral LE pain.     Time  3    Period  Weeks    Status  On-going    Target Date  06/09/18        PT Long Term Goals - 05/16/18 1224      PT LONG TERM GOAL #1   Title  Patient will have a decrease in back pain to 5/10 or less at worst to promote ability to ambulate, peform standing tasks more comfortably.     Baseline  10/10 back pain at worst for the past 3 months. (03/24/2018); 9/10 at worst at night, 4-5/10 during the daytime (05/16/2018)    Time  6    Period  Weeks    Status  On-going    Target Date  06/30/18      PT LONG TERM GOAL #2   Title  Patient will improve her lumbar spine FOTO score by at least 10 points as a demonstration of improved function.     Baseline  Lumbar spine FOTO: 37 (03/24/2018); 54 (05/16/2018)    Time  6    Period  Weeks    Status  Achieved    Target Date  06/30/18      PT LONG TERM GOAL #3   Title  Pt will improve bilateral hip abduction and extension strength by at least 1/2 MMT grade to promote ability to perform standing tasks with less back pain.     Time  6    Period  Weeks    Status  Partially Met    Target Date  06/30/18            Plan - 05/18/18 1112    Clinical Impression Statement  Continued working on decreasing L lateral shift posture, and improving thoracic extension and trunk strength to decrease pressure to low back. Light session today secondary to fatigue and sleepiness from the side effect of her Parkinson's medication. Pt tolerated session well without aggravation of symptoms.     Rehab Potential  Fair    Clinical Impairments Affecting Rehab Potential  (-) Chronicity of back pain, B LE pain, Parkinson's, age; (+) motivated    PT Frequency  2x / week    PT Duration  6 weeks    PT Treatment/Interventions  Therapeutic activities;Therapeutic exercise;Balance training;Neuromuscular  re-education;Patient/family education;Manual techniques;Dry needling;Aquatic Therapy;Electrical Stimulation;Iontophoresis 39m/ml Dexamethasone;Gait  training    PT Next Visit Plan  trunk, hip strengthening, lumbopelvic control, gait training, manual techniques, modalities PRN    Consulted and Agree with Plan of Care  Patient;Family member/caregiver    Family Member Consulted  husband       Patient will benefit from skilled therapeutic intervention in order to improve the following deficits and impairments:  Pain, Improper body mechanics, Postural dysfunction, Decreased strength, Difficulty walking, Decreased safety awareness, Decreased balance, Abnormal gait  Visit Diagnosis: Chronic bilateral low back pain with bilateral sciatica  Radiculopathy, lumbosacral region  Muscle weakness (generalized)  Difficulty in walking, not elsewhere classified     Problem List Patient Active Problem List   Diagnosis Date Noted  . Numbness 03/09/2018  . Lumbar spondylosis 03/07/2018  . Chronic pain of left knee 03/07/2018  . Chronic upper back pain 12/08/2017  . Benign neoplasm of hand 10/20/2017  . Chronic pain syndrome 10/20/2017  . Chronic pain of right lower extremity 10/20/2017  . Chronic myofascial pain 10/20/2017  . Numbness and tingling 07/12/2017  . Sleep difficulties 07/12/2017  . Low back pain 01/18/2017  . Sleep behavior disorder, REM 01/18/2017  . Lumbar radiculopathy 11/01/2016  . Follicular lymphoma of extranodal and solid organ sites [facility]) 05/01/2016  . Lymphoma ([place]) 04/30/2016  . Hallux limitus 07/27/2015  . Low back pain with sciatica 02/13/2015  . Bilateral low back pain with sciatica 02/13/2015  . H/O adenomatous polyp of colon 04/06/2014  . Hx of adenomatous colonic polyps 04/06/2014  . Back ache 01/25/2014  . Adiposity 01/25/2014  . REM sleep behavior disorder 01/25/2014  . Disordered sleep 01/25/2014  . Has a tremor 01/25/2014  . Obesity, unspecified 01/25/2014   . Sleep disorder 01/25/2014  . Backache 01/25/2014  . Tremor 01/25/2014  . Obesity 01/25/2014  . Difficulty hearing 06/08/2012  . Hearing loss 06/08/2012  . Anxiety 06/06/2012  . Colon polyp 06/06/2012  . Clinical depression 06/06/2012  . Hypercholesteremia 06/06/2012  . Idiopathic Parkinson's disease ([place]) 06/06/2012  . Detached retina 06/06/2012  . Depression 06/06/2012  . Retinal detachment 06/06/2012  . Parkinson's disease ([place]) 06/06/2012    Joneen Boers PT, DPT   05/18/2018, 11:55 AM  [facility] [address], [place], [address] Phone: [phone]   Fax:  [phone]  Name: LAURALYN SHADOWENS MRN: [id number] Date of Birth: Aug 17, 1950

## 2018-05-19 ENCOUNTER — Ambulatory Visit: Payer: Medicare Other

## 2018-05-19 ENCOUNTER — Ambulatory Visit: Payer: Medicare Other | Admitting: Internal Medicine

## 2018-05-19 ENCOUNTER — Other Ambulatory Visit: Payer: Medicare Other

## 2018-05-20 ENCOUNTER — Other Ambulatory Visit: Payer: Self-pay

## 2018-05-20 ENCOUNTER — Inpatient Hospital Stay: Payer: Medicare Other | Attending: Internal Medicine

## 2018-05-20 ENCOUNTER — Inpatient Hospital Stay (HOSPITAL_BASED_OUTPATIENT_CLINIC_OR_DEPARTMENT_OTHER): Payer: Medicare Other | Admitting: Internal Medicine

## 2018-05-20 ENCOUNTER — Encounter: Payer: Self-pay | Admitting: Internal Medicine

## 2018-05-20 VITALS — BP 136/77 | HR 81 | Temp 97.6°F | Resp 22

## 2018-05-20 DIAGNOSIS — G8929 Other chronic pain: Secondary | ICD-10-CM | POA: Diagnosis not present

## 2018-05-20 DIAGNOSIS — R161 Splenomegaly, not elsewhere classified: Secondary | ICD-10-CM | POA: Insufficient documentation

## 2018-05-20 DIAGNOSIS — Z87891 Personal history of nicotine dependence: Secondary | ICD-10-CM | POA: Insufficient documentation

## 2018-05-20 DIAGNOSIS — R5382 Chronic fatigue, unspecified: Secondary | ICD-10-CM | POA: Diagnosis not present

## 2018-05-20 DIAGNOSIS — Z79899 Other long term (current) drug therapy: Secondary | ICD-10-CM | POA: Diagnosis not present

## 2018-05-20 DIAGNOSIS — C20 Malignant neoplasm of rectum: Secondary | ICD-10-CM

## 2018-05-20 DIAGNOSIS — C8299 Follicular lymphoma, unspecified, extranodal and solid organ sites: Secondary | ICD-10-CM

## 2018-05-20 DIAGNOSIS — E041 Nontoxic single thyroid nodule: Secondary | ICD-10-CM | POA: Insufficient documentation

## 2018-05-20 DIAGNOSIS — D1723 Benign lipomatous neoplasm of skin and subcutaneous tissue of right leg: Secondary | ICD-10-CM | POA: Insufficient documentation

## 2018-05-20 DIAGNOSIS — G20 Parkinson's disease: Secondary | ICD-10-CM | POA: Diagnosis not present

## 2018-05-20 LAB — CBC WITH DIFFERENTIAL/PLATELET
Abs Immature Granulocytes: 0.02 10*3/uL (ref 0.00–0.07)
Basophils Absolute: 0 10*3/uL (ref 0.0–0.1)
Basophils Relative: 1 %
Eosinophils Absolute: 0.2 10*3/uL (ref 0.0–0.5)
Eosinophils Relative: 3 %
HCT: 39.6 % (ref 36.0–46.0)
Hemoglobin: 12.1 g/dL (ref 12.0–15.0)
Immature Granulocytes: 0 %
Lymphocytes Relative: 27 %
Lymphs Abs: 1.5 10*3/uL (ref 0.7–4.0)
MCH: 26.9 pg (ref 26.0–34.0)
MCHC: 30.6 g/dL (ref 30.0–36.0)
MCV: 88 fL (ref 80.0–100.0)
Monocytes Absolute: 0.5 10*3/uL (ref 0.1–1.0)
Monocytes Relative: 9 %
Neutro Abs: 3.5 10*3/uL (ref 1.7–7.7)
Neutrophils Relative %: 60 %
Platelets: 165 10*3/uL (ref 150–400)
RBC: 4.5 MIL/uL (ref 3.87–5.11)
RDW: 13.2 % (ref 11.5–15.5)
WBC: 5.7 10*3/uL (ref 4.0–10.5)
nRBC: 0 % (ref 0.0–0.2)

## 2018-05-20 LAB — COMPREHENSIVE METABOLIC PANEL
ALT: <5 U/L (ref 0–44)
AST: 15 U/L (ref 15–41)
Albumin: 3.8 g/dL (ref 3.5–5.0)
Alkaline Phosphatase: 95 U/L (ref 38–126)
Anion gap: 7 (ref 5–15)
BUN: 23 mg/dL (ref 8–23)
CO2: 28 mmol/L (ref 22–32)
Calcium: 9.1 mg/dL (ref 8.9–10.3)
Chloride: 109 mmol/L (ref 98–111)
Creatinine, Ser: 0.41 mg/dL — ABNORMAL LOW (ref 0.44–1.00)
GFR calc Af Amer: >60 mL/min (ref >60)
GFR calc non Af Amer: >60 mL/min (ref >60)
Glucose, Bld: 98 mg/dL (ref 70–99)
Potassium: 4 mmol/L (ref 3.5–5.1)
Sodium: 144 mmol/L (ref 135–145)
Total Bilirubin: 0.5 mg/dL (ref 0.3–1.2)
Total Protein: 6.2 g/dL — ABNORMAL LOW (ref 6.5–8.1)

## 2018-05-20 LAB — LACTATE DEHYDROGENASE: LDH: 137 U/L (ref 98–192)

## 2018-05-20 NOTE — Progress Notes (Signed)
[facility] OFFICE PROGRESS NOTE  Patient Care Team: Cletis Athens, MD as PCP - General (Cardiology)  Cancer Staging No matching staging information was found for the patient.   Oncology History   1. Superficial Protidectomy (right side) December 17/2013.  Consistent with low-grade CD10 and [id number] to follicular lymphoma grade 1-2.  Is positive for CD19 and cough of light chain.bone marrow aspiration and biopsy is negative .  PET scan was positive for some increased uptake in the spleen; Stage IVA disease PET scan is consistent with bilateral neck lymph node (December, 2013) 2. Weekly rituxan has been started.  August 24, 2012 3. Patient finished the last dose of Rituxan on September 14 2012  #  maintenance rituximab since April of 2014 patient has finished rituximabin December of 2015  # PET MARCH 2017 -Splenic recurrence; CT images stable. 4th October 2017- PET - NED.   # chronic back pain/PN/ Parkinsons [Dr.Potter] -----------------------------------------------   DIAGNOSIS: FOLLICULAR LYMPHOMA  STAGE:  IV       ;GOALS: control  CURRENT/MOST RECENT THERAPY: surveillaince      Follicular lymphoma of extranodal and solid organ sites [facility])      INTERVAL HISTORY:  Sarah Donaldson 67 y.o.  adult pleasant patient above history of Low-grade non-Hodgkin's lymphoma is here for follow-up/review the results of the PET scan.  Patient continues to suffer from chronic back pain chronic joint pains-this is neither getting any better or worse.   Patient complains of a lump in the left upper lateral thigh.  No pain.  Chronic fatigue.  Review of Systems  Constitutional: Positive for malaise/fatigue. Negative for chills, diaphoresis, fever and weight loss.  HENT: Negative for nosebleeds and sore throat.   Eyes: Negative for double vision.  Respiratory: Negative for cough, hemoptysis, sputum production, shortness of breath and wheezing.   Cardiovascular: Negative  for chest pain, palpitations, orthopnea and leg swelling.  Gastrointestinal: Negative for abdominal pain, blood in stool, constipation, diarrhea, heartburn, melena, nausea and vomiting.  Genitourinary: Negative for dysuria, frequency and urgency.  Musculoskeletal: Positive for back pain and joint pain.  Skin: Negative.  Negative for itching and rash.  Neurological: Positive for dizziness and tremors (Chronic and attributed to Parkinson's.). Negative for tingling, focal weakness, weakness and headaches.  Endo/Heme/Allergies: Does not bruise/bleed easily.  Psychiatric/Behavioral: Negative for depression. The patient is not nervous/anxious and does not have insomnia.      PAST MEDICAL HISTORY :  Past Medical History:  Diagnosis Date  . Cancer ([place])    Non-hodgkin's lymphoma (in remission)  . Knee joint cyst, left 02/07/2018  . Parkinson disease ([place])   . Parkinson disease ([place])     PAST SURGICAL HISTORY :   Past Surgical History:  Procedure Laterality Date  . BACK SURGERY    . BUNIONECTOMY    . NASAL SEPTUM SURGERY    . RETINAL DETACHMENT SURGERY      FAMILY HISTORY :   Family History  Problem Relation Age of Onset  . Heart disease Mother     SOCIAL HISTORY:   Social History   Tobacco Use  . Smoking status: Former Research scientist (life sciences)  . Smokeless tobacco: Never Used  Substance Use Topics  . Alcohol use: Yes    Alcohol/week: 0.0 standard drinks  . Drug use: No    ALLERGIES:  is allergic to codeine sulfate and gabapentin.  MEDICATIONS:  Current Outpatient Medications  Medication Sig Dispense Refill  . acetaminophen (TYLENOL) 650 MG CR tablet Take 650 mg by  mouth 2 (two) times daily.    . carbidopa-levodopa (SINEMET IR) 25-100 MG tablet Take 1 tablet by mouth 5 (five) times daily.    . clonazePAM (KLONOPIN) 2 MG tablet Take 1-3 mg by mouth See admin instructions. 1 mg every morning and 3 mg at bedtime    . furosemide (LASIX) 40 MG tablet Take 20 mg by mouth 2 (two) times a week.  Every Sunday and every Wednesday/Thursday    . naproxen (NAPROSYN) 500 MG tablet Take 1 tablet by mouth daily.    Marland Kitchen neomycin-polymyxin b-dexamethasone (MAXITROL) 4.1-32440-1.0 OINT 1 application.    Marland Kitchen omeprazole (PRILOSEC) 20 MG capsule Take 1 capsule (20 mg total) by mouth daily. 90 capsule 0  . pramipexole (MIRAPEX) 1.5 MG tablet Take 1.5 mg by mouth 3 (three) times daily.  5  . prednisoLONE acetate (PRED FORTE) 1 % ophthalmic suspension Place 1 drop into the left eye daily.  5  . pregabalin (LYRICA) 25 MG capsule Take 1 capsule (25 mg total) by mouth 4 (four) times daily. 120 capsule 2  . promethazine (PHENERGAN) 12.5 MG tablet Take 12.5 mg by mouth every 6 (six) hours as needed for nausea or vomiting.    . ranitidine (ZANTAC) 150 MG capsule Take 150 mg by mouth daily.     . selegiline (ELDEPRYL) 5 MG capsule Take 5 mg by mouth 2 (two) times daily with breakfast and lunch.   1  . traZODone (DESYREL) 50 MG tablet Take 50-100 mg by mouth at bedtime as needed for sleep.   1   No current facility-administered medications for this visit.     PHYSICAL EXAMINATION: ECOG PERFORMANCE STATUS: 0 - Asymptomatic  BP 136/77   Pulse 81   Temp 97.6 F (36.4 C) (Oral)   Resp (!) 22   There were no vitals filed for this visit.  Physical Exam  Constitutional: She is oriented to person, place, and time and well-developed, well-nourished, and in no distress.  HENT:  Head: Normocephalic and atraumatic.  Mouth/Throat: Oropharynx is clear and moist. No oropharyngeal exudate.  Eyes: Pupils are equal, round, and reactive to light.  Neck: Normal range of motion. Neck supple.  Cardiovascular: Normal rate and regular rhythm.  Pulmonary/Chest: No respiratory distress. She has no wheezes.  Abdominal: Soft. Bowel sounds are normal. She exhibits no distension and no mass. There is no tenderness. There is no rebound and no guarding.  Musculoskeletal: Normal range of motion. She exhibits no edema or tenderness.   2 x 2 centimeter lump noted in the left upper thigh (lipoma) nontender.  Neurological: She is alert and oriented to person, place, and time.  Skin: Skin is warm.  Psychiatric: Affect normal.    LABORATORY DATA:  I have reviewed the data as listed    Component Value Date/Time   NA 144 05/20/2018 0841   NA 139 12/03/2014 1038   K 4.0 05/20/2018 0841   K 3.6 12/03/2014 1038   CL 109 05/20/2018 0841   CL 106 12/03/2014 1038   CO2 28 05/20/2018 0841   CO2 28 12/03/2014 1038   GLUCOSE 98 05/20/2018 0841   GLUCOSE 94 12/03/2014 1038   BUN 23 05/20/2018 0841   BUN 21 (H) 12/03/2014 1038   CREATININE 0.41 (L) 05/20/2018 0841   CREATININE 0.65 12/03/2014 1038   CALCIUM 9.1 05/20/2018 0841   CALCIUM 9.1 12/03/2014 1038   PROT 6.2 (L) 05/20/2018 0841   PROT 6.9 12/03/2014 1038   ALBUMIN 3.8 05/20/2018 0841   ALBUMIN  4.2 12/03/2014 1038   AST 15 05/20/2018 0841   AST 19 12/03/2014 1038   ALT <5 05/20/2018 0841   ALT < 5 (L) 12/03/2014 1038   ALKPHOS 95 05/20/2018 0841   ALKPHOS 89 12/03/2014 1038   BILITOT 0.5 05/20/2018 0841   BILITOT 0.8 12/03/2014 1038   GFRNONAA >60 05/20/2018 0841   GFRNONAA >60 12/03/2014 1038   GFRAA >60 05/20/2018 0841   GFRAA >60 12/03/2014 1038    No results found for: SPEP, UPEP  Lab Results  Component Value Date   WBC 5.7 05/20/2018   NEUTROABS 3.5 05/20/2018   HGB 12.1 05/20/2018   HCT 39.6 05/20/2018   MCV 88.0 05/20/2018   PLT 165 05/20/2018      Chemistry      Component Value Date/Time   NA 144 05/20/2018 0841   NA 139 12/03/2014 1038   K 4.0 05/20/2018 0841   K 3.6 12/03/2014 1038   CL 109 05/20/2018 0841   CL 106 12/03/2014 1038   CO2 28 05/20/2018 0841   CO2 28 12/03/2014 1038   BUN 23 05/20/2018 0841   BUN 21 (H) 12/03/2014 1038   CREATININE 0.41 (L) 05/20/2018 0841   CREATININE 0.65 12/03/2014 1038      Component Value Date/Time   CALCIUM 9.1 05/20/2018 0841   CALCIUM 9.1 12/03/2014 1038   ALKPHOS 95 05/20/2018 0841    ALKPHOS 89 12/03/2014 1038   AST 15 05/20/2018 0841   AST 19 12/03/2014 1038   ALT <5 05/20/2018 0841   ALT < 5 (L) 12/03/2014 1038   BILITOT 0.5 05/20/2018 0841   BILITOT 0.8 12/03/2014 1038     IMPRESSION: 1. No evidence for residual or recurrent hypermetabolic tumor. 2. Similar appearance of hypermetabolic left thyroid nodule.   Electronically Signed   By: Kerby Moors M.D.   On: 05/19/2017 12:03   RADIOGRAPHIC STUDIES: I have personally reviewed the radiological images as listed and agreed with the findings in the report. No results found.   ASSESSMENT & PLAN:  Follicular lymphoma of extranodal and solid organ sites [facility]) #Stage IV low-grade follicular lymphoma; involving the spleen. Rituxan maintenance last December 2015.  Currently on surveillance.  Clinically stable/October 2019 - PET scan negative for any recurrence.  We will plan imaging on annual basis.  # mild splenomegaly-no focal lesions noted in the spleen.  Monitor for now.  # Left thyroid nodule status post previous biopsy- followed by Dr.McQueen.  Not evident PET scan.  # s/p back surgery/ Chronic pain right hip low back/sciatica.STABLE.  # Right thigh lipoma- STABLE.   #  DISPOSITION:  # follow up in 6 months/labs-CBC/CMP/LDH/  M.D-Dr.B  # I reviewed the blood work- with the patient in detail; also reviewed the imaging independently [as summarized above]; and with the patient in detail.    # 25 minutes face-to-face with the patient discussing the above plan of care; more than 50% of time spent on prognosis/ natural history; counseling and coordination.  Orders Placed This Encounter  Procedures  . CBC with Differential/Platelet    Standing Status:   Future    Standing Expiration Date:   05/21/2019  . Comprehensive metabolic panel    Standing Status:   Future    Standing Expiration Date:   05/21/2019  . Lactate dehydrogenase    Standing Status:   Future    Standing Expiration Date:    05/21/2019   All questions were answered. The patient knows to call the clinic with any  problems, questions or concerns.      Cammie Sickle, MD 05/20/2018 6:40 PM

## 2018-05-20 NOTE — Assessment & Plan Note (Addendum)
#  Stage IV low-grade follicular lymphoma; involving the spleen. Rituxan maintenance last December 2015.  Currently on surveillance.  Clinically stable/October 2019 - PET scan negative for any recurrence.  We will plan imaging on annual basis.  # mild splenomegaly-no focal lesions noted in the spleen.  Monitor for now.  # Left thyroid nodule status post previous biopsy- followed by Dr.McQueen.  Not evident PET scan.  # s/p back surgery/ Chronic pain right hip low back/sciatica.STABLE.  # Right thigh lipoma- STABLE.   #  DISPOSITION:  # follow up in 6 months/labs-CBC/CMP/LDH/  M.D-Dr.B  # I reviewed the blood work- with the patient in detail; also reviewed the imaging independently [as summarized above]; and with the patient in detail.

## 2018-05-23 ENCOUNTER — Ambulatory Visit: Payer: Medicare Other

## 2018-05-24 DIAGNOSIS — M7052 Other bursitis of knee, left knee: Secondary | ICD-10-CM | POA: Diagnosis not present

## 2018-05-24 DIAGNOSIS — C8190 Hodgkin lymphoma, unspecified, unspecified site: Secondary | ICD-10-CM | POA: Diagnosis not present

## 2018-05-24 DIAGNOSIS — F419 Anxiety disorder, unspecified: Secondary | ICD-10-CM | POA: Diagnosis not present

## 2018-05-24 DIAGNOSIS — K219 Gastro-esophageal reflux disease without esophagitis: Secondary | ICD-10-CM | POA: Diagnosis not present

## 2018-05-25 ENCOUNTER — Ambulatory Visit: Payer: Medicare Other

## 2018-05-25 ENCOUNTER — Ambulatory Visit
Admission: RE | Admit: 2018-05-25 | Discharge: 2018-05-25 | Disposition: A | Discharge status: Home / Self Care | Payer: Medicare Other | Source: Ambulatory Visit | Attending: Internal Medicine | Admitting: Internal Medicine

## 2018-05-25 DIAGNOSIS — M5442 Lumbago with sciatica, left side: Secondary | ICD-10-CM | POA: Diagnosis not present

## 2018-05-25 DIAGNOSIS — M5417 Radiculopathy, lumbosacral region: Secondary | ICD-10-CM

## 2018-05-25 DIAGNOSIS — M6281 Muscle weakness (generalized): Secondary | ICD-10-CM | POA: Diagnosis not present

## 2018-05-25 DIAGNOSIS — M5441 Lumbago with sciatica, right side: Principal | ICD-10-CM

## 2018-05-25 DIAGNOSIS — R928 Other abnormal and inconclusive findings on diagnostic imaging of breast: Secondary | ICD-10-CM

## 2018-05-25 DIAGNOSIS — G8929 Other chronic pain: Secondary | ICD-10-CM | POA: Diagnosis not present

## 2018-05-25 DIAGNOSIS — R262 Difficulty in walking, not elsewhere classified: Secondary | ICD-10-CM

## 2018-05-25 NOTE — Therapy (Signed)
[facility] [name] [place], [address] Phone: [phone]   Fax:  [phone]  Physical Therapy Treatment  Patient Details  Name: Sarah Donaldson MRN: [id number] Date of Birth: April 12, 1951 Referring Provider (PT): Gurney Maxin, MD   Encounter Date: 05/25/2018  PT End of Session - 05/25/18 0957    Visit Number  11    Number of Visits  29    Date for PT Re-Evaluation  06/30/18    Authorization Type  2    Authorization Time Period  of 10 progress report    PT Start Time  0958   pt arrived late   PT Stop Time  1027    PT Time Calculation (min)  29 min    Equipment Utilized During Treatment  Gait belt    Activity Tolerance  Patient limited by pain;Patient tolerated treatment well    Behavior During Therapy  Restless;Impulsive;WFL for tasks assessed/performed       Past Medical History:  Diagnosis Date  . Cancer ([place])    Non-hodgkin's lymphoma (in remission)  . Knee joint cyst, left 02/07/2018  . Parkinson disease ([place])   . Parkinson disease [facility])     Past Surgical History:  Procedure Laterality Date  . BACK SURGERY    . BUNIONECTOMY    . NASAL SEPTUM SURGERY    . RETINAL DETACHMENT SURGERY      There were no vitals filed for this visit.  Subjective Assessment - 05/25/18 0959    Subjective  Back is a little sore but not bad. 5/10 currently. Usually sore at night after PT. Ok the next day.    Pertinent History  Chronic back pain. Has had Parkinsons since she was 67 years old. Has had back pain for years. Worsened around 2 years ago and feels cramping as well as pins and needles R medial and lateral thigh as well as L thigh.  Pt states that [name] increased last week. Difficulty bending her L leg and to kneel due to pain. Wears a knee brace to help with the swelling.   Pt states that when she walks on her L LE and steps on it, it gives way which started around the past 2 days.   Pt  states that her back pain has gotten worse since 2 years ago.  Walking makes her legs feel heavy. Pt uses her SPC sporadically when her legs bother her.  Denies loss of bowel or bladder control.  Pt unsure if she has tingling or numness bilateral inner thigh.  Pt also states having Non-Hodgkins lymphoma cancer.   Pt states tinlging and numbness bilateral posterior LE going down to her feet.   Pt also states falling 3 times within the past 6 months.  Pt tripped on a stool, pt also was running and in a hurry, did not watch where she was going and tripped on a basket. Pt also fell off the bed when she was sleeping.      Currently in Pain?  Yes    Pain Score  5                                PT Education - 05/25/18 1006    Education Details  ther-ex    Person(s) Educated  Patient    Methods  Explanation;Demonstration;Tactile cues;Verbal cues    Comprehension  Returned demonstration;Verbalized understanding  Objectives   Therapeutic exercise   Seated manually resisted R trunk side bend isometrics with back unsupported10x10 secondsfor 3 setsto help decrease L lateral lean  Seated R trunk rotation isometrics (to decrease L trunk rotation posture) with PT manual resistance. 10x3 with 5 second holds  Sitting with back unsupported, hips less than 90 degrees flexion             Clamshell isometrics 10x5 seconds for 3 sets  Sitting with shifting body weight onto R hip. Decreased L lateral shift and R thoracolumbar side bend  Sit <> stand from elevated mat table 23 inches high10x2 with glute max squeeze   Try Nustep next visit if appropriate.    Improved exercise technique, movement at target joints, use of target muscles after mod verbal, visual, tactile cues.     Pt tendency for L lateral shift, R throacolumbar side bending, and L trunk rotation posture. Decreased back pain with correction of posture. Challenges to progress for back pain include  movement deficiencies associated with Parkinson's. Pt will benefit from continued skilled physical therapy services to decrease back pain, improve LE strength and ability to perform functional tasks.        PT Short Term Goals - 05/16/18 1224      PT SHORT TERM GOAL #1   Title  Patient will be independent with her HEP to promote trunk and LE strength, and to help decrease back and bilateral LE pain.     Time  3    Period  Weeks    Status  On-going    Target Date  06/09/18        PT Long Term Goals - 05/16/18 1224      PT LONG TERM GOAL #1   Title  Patient will have a decrease in back pain to 5/10 or less at worst to promote ability to ambulate, peform standing tasks more comfortably.     Baseline  10/10 back pain at worst for the past 3 months. (03/24/2018); 9/10 at worst at night, 4-5/10 during the daytime (05/16/2018)    Time  6    Period  Weeks    Status  On-going    Target Date  06/30/18      PT LONG TERM GOAL #2   Title  Patient will improve her lumbar spine FOTO score by at least 10 points as a demonstration of improved function.     Baseline  Lumbar spine FOTO: 37 (03/24/2018); 54 (05/16/2018)    Time  6    Period  Weeks    Status  Achieved    Target Date  06/30/18      PT LONG TERM GOAL #3   Title  Pt will improve bilateral hip abduction and extension strength by at least 1/2 MMT grade to promote ability to perform standing tasks with less back pain.     Time  6    Period  Weeks    Status  Partially Met    Target Date  06/30/18            Plan - 05/25/18 0956    Clinical Impression Statement  Pt tendency for L lateral shift, R throacolumbar side bending, and L trunk rotation posture. Decreased back pain with correction of posture. Challenges to progress for back pain include movement deficiencies associated with Parkinson's. Pt will benefit from continued skilled physical therapy services to decrease back pain, improve LE strength and ability to perform  functional tasks.  Rehab Potential  Fair    Clinical Impairments Affecting Rehab Potential  (-) Chronicity of back pain, B LE pain, Parkinson's, age; (+) motivated    PT Frequency  2x / week    PT Duration  6 weeks    PT Treatment/Interventions  Therapeutic activities;Therapeutic exercise;Balance training;Neuromuscular re-education;Patient/family education;Manual techniques;Dry needling;Aquatic Therapy;Electrical Stimulation;Iontophoresis 27m/ml Dexamethasone;Gait training    PT Next Visit Plan  trunk, hip strengthening, lumbopelvic control, gait training, manual techniques, modalities PRN    Consulted and Agree with Plan of Care  Patient;Family member/caregiver    Family Member Consulted  husband       Patient will benefit from skilled therapeutic intervention in order to improve the following deficits and impairments:  Pain, Improper body mechanics, Postural dysfunction, Decreased strength, Difficulty walking, Decreased safety awareness, Decreased balance, Abnormal gait  Visit Diagnosis: Chronic bilateral low back pain with bilateral sciatica  Radiculopathy, lumbosacral region  Muscle weakness (generalized)  Difficulty in walking, not elsewhere classified     Problem List Patient Active Problem List   Diagnosis Date Noted  . Numbness 03/09/2018  . Lumbar spondylosis 03/07/2018  . Chronic pain of left knee 03/07/2018  . Chronic upper back pain 12/08/2017  . Benign neoplasm of hand 10/20/2017  . Chronic pain syndrome 10/20/2017  . Chronic pain of right lower extremity 10/20/2017  . Chronic myofascial pain 10/20/2017  . Numbness and tingling 07/12/2017  . Sleep difficulties 07/12/2017  . Low back pain 01/18/2017  . Sleep behavior disorder, REM 01/18/2017  . Lumbar radiculopathy 11/01/2016  . Follicular lymphoma of extranodal and solid organ sites ([facility] 05/01/2016  . Lymphoma ([place] 04/30/2016  . Hallux limitus 07/27/2015  . Low back pain with sciatica 02/13/2015  .  Bilateral low back pain with sciatica 02/13/2015  . H/O adenomatous polyp of colon 04/06/2014  . Hx of adenomatous colonic polyps 04/06/2014  . Back ache 01/25/2014  . Adiposity 01/25/2014  . REM sleep behavior disorder 01/25/2014  . Disordered sleep 01/25/2014  . Has a tremor 01/25/2014  . Obesity, unspecified 01/25/2014  . Sleep disorder 01/25/2014  . Backache 01/25/2014  . Tremor 01/25/2014  . Obesity 01/25/2014  . Difficulty hearing 06/08/2012  . Hearing loss 06/08/2012  . Anxiety 06/06/2012  . Colon polyp 06/06/2012  . Clinical depression 06/06/2012  . Hypercholesteremia 06/06/2012  . Idiopathic Parkinson's disease ([place] 06/06/2012  . Detached retina 06/06/2012  . Depression 06/06/2012  . Retinal detachment 06/06/2012  . Parkinson's disease ([place] 06/06/2012    MJoneen BoersPT, DPT   05/25/2018, 3:41 PM  [facility] [address] [place] [address]Phone: [phone]  Fax:  [phone] Name: Sarah REINECKEMRN: [id number] of Birth: 411-24-1952

## 2018-05-25 NOTE — Patient Instructions (Signed)
Sitting up right on your chair:   Shift your body weight onto your right hip (to promote upright posture)

## 2018-05-27 ENCOUNTER — Encounter: Payer: Self-pay | Admitting: Internal Medicine

## 2018-05-30 ENCOUNTER — Ambulatory Visit: Payer: Medicare Other

## 2018-05-30 DIAGNOSIS — R262 Difficulty in walking, not elsewhere classified: Secondary | ICD-10-CM

## 2018-05-30 DIAGNOSIS — G8929 Other chronic pain: Secondary | ICD-10-CM | POA: Diagnosis not present

## 2018-05-30 DIAGNOSIS — M5441 Lumbago with sciatica, right side: Principal | ICD-10-CM

## 2018-05-30 DIAGNOSIS — M6281 Muscle weakness (generalized): Secondary | ICD-10-CM | POA: Diagnosis not present

## 2018-05-30 DIAGNOSIS — M5442 Lumbago with sciatica, left side: Principal | ICD-10-CM

## 2018-05-30 DIAGNOSIS — M5417 Radiculopathy, lumbosacral region: Secondary | ICD-10-CM | POA: Diagnosis not present

## 2018-05-30 NOTE — Therapy (Signed)
[facility] [address], [place], [address] Phone: [phone]   Fax:  [phone]  Physical Therapy Treatment  Patient Details  Name: Sarah Donaldson MRN: [id number] Date of Birth: 12-02-1950 Referring Provider (PT): Gurney Maxin, MD   Encounter Date: 05/30/2018  PT End of Session - 05/30/18 1129    Visit Number  12    Number of Visits  29    Date for PT Re-Evaluation  06/30/18    Authorization Type  3    Authorization Time Period  of 10 progress report    PT Start Time  1129   pt arrived late   PT Stop Time  1208    PT Time Calculation (min)  39 min    Equipment Utilized During Treatment  Gait belt    Activity Tolerance  Patient limited by pain;Patient tolerated treatment well    Behavior During Therapy  Restless;Impulsive;WFL for tasks assessed/performed       Past Medical History:  Diagnosis Date  . Cancer ([place])    Non-hodgkin's lymphoma (in remission)  . Knee joint cyst, left 02/07/2018  . Parkinson disease ([place])   . Parkinson disease [facility])     Past Surgical History:  Procedure Laterality Date  . BACK SURGERY    . BUNIONECTOMY    . NASAL SEPTUM SURGERY    . RETINAL DETACHMENT SURGERY      There were no vitals filed for this visit.  Subjective Assessment - 05/30/18 1131    Subjective  Pt states being late this morning due to one her her credit cards being compromised. Back was sore last night but pt was on the go all weekend.  Back is fine. Took her Parkinson's medicine.     Pertinent History  Chronic back pain. Has had Parkinsons since she was 67 years old. Has had back pain for years. Worsened around 2 years ago and feels cramping as well as pins and needles R medial and lateral thigh as well as L thigh.  Pt states that [name] increased last week. Difficulty bending her L leg and to kneel due to pain. Wears a knee brace to help with the swelling.   Pt states that when she  walks on her L LE and steps on it, it gives way which started around the past 2 days.   Pt states that her back pain has gotten worse since 2 years ago.  Walking makes her legs feel heavy. Pt uses her SPC sporadically when her legs bother her.  Denies loss of bowel or bladder control.  Pt unsure if she has tingling or numness bilateral inner thigh.  Pt also states having Non-Hodgkins lymphoma cancer.   Pt states tinlging and numbness bilateral posterior LE going down to her feet.   Pt also states falling 3 times within the past 6 months.  Pt tripped on a stool, pt also was running and in a hurry, did not watch where she was going and tripped on a basket. Pt also fell off the bed when she was sleeping.      Currently in Pain?  No/denies    Pain Score  0-No pain                               PT Education - 05/30/18 1212    Education Details  ther-ex    Person(s) Educated  Patient  Methods  Explanation;Demonstration;Tactile cues;Verbal cues    Comprehension  Returned demonstration;Verbalized understanding       Objectives    Therapeutic exercise   Seated manually resisted R trunk side bend isometrics with backunsupported10x10 seconds, then 4x10 seconds. Increased R low back/posterior hip pulling sensation.   Seated R trunk rotation isometrics (to decrease L trunk rotation posture) with PT manual resistance. 10x10 seconds, then 10x2 with 5 second holds   Seated trunk flexion isometrics with PT manual resistance 10x5 seconds for 3 sets.   Seated pallof press yellow band 5x5 seconds each side with single yellow band, then 3x5 with 5 seconds each side with double yellow band. Improved R lateral shift with resistance on L side.   Then with R side resistance double band, 5x5 seconds   Nustep, seat 6, arms 6 for 4 minutes level 1 (3 min unbilled). Cues for pace.   Improved exercise technique, movement at target joints, use of target muscles after mod verbal,  visual, tactile cues.   Pt demonstrates some progress with back pain secondary to pt reports of no pain at start of session for the first time. Continued working on trunk strengthening and posture to help decrease L lateral lean, and L trunk rotation. Decreased R low back pulling sensation with seated pallof press with resistance on R side. Pt will benefit from continued skilled physical therapy services to improve trunk and hip strength, decrease back pain, and improve function.          PT Short Term Goals - 05/16/18 1224      PT SHORT TERM GOAL #1   Title  Patient will be independent with her HEP to promote trunk and LE strength, and to help decrease back and bilateral LE pain.     Time  3    Period  Weeks    Status  On-going    Target Date  06/09/18        PT Long Term Goals - 05/16/18 1224      PT LONG TERM GOAL #1   Title  Patient will have a decrease in back pain to 5/10 or less at worst to promote ability to ambulate, peform standing tasks more comfortably.     Baseline  10/10 back pain at worst for the past 3 months. (03/24/2018); 9/10 at worst at night, 4-5/10 during the daytime (05/16/2018)    Time  6    Period  Weeks    Status  On-going    Target Date  06/30/18      PT LONG TERM GOAL #2   Title  Patient will improve her lumbar spine FOTO score by at least 10 points as a demonstration of improved function.     Baseline  Lumbar spine FOTO: 37 (03/24/2018); 54 (05/16/2018)    Time  6    Period  Weeks    Status  Achieved    Target Date  06/30/18      PT LONG TERM GOAL #3   Title  Pt will improve bilateral hip abduction and extension strength by at least 1/2 MMT grade to promote ability to perform standing tasks with less back pain.     Time  6    Period  Weeks    Status  Partially Met    Target Date  06/30/18            Plan - 05/30/18 1213    Clinical Impression Statement  Pt demonstrates some progress with back pain secondary to  pt reports of no pain at  start of session for the first time. Continued working on trunk strengthening and posture to help decrease L lateral lean, and L trunk rotation. Decreased R low back pulling sensation with seated pallof press with resistance on R side. Pt will benefit from continued skilled physical therapy services to improve trunk and hip strength, decrease back pain, and improve function.     Rehab Potential  Fair    Clinical Impairments Affecting Rehab Potential  (-) Chronicity of back pain, B LE pain, Parkinson's, age; (+) motivated    PT Frequency  2x / week    PT Duration  6 weeks    PT Treatment/Interventions  Therapeutic activities;Therapeutic exercise;Balance training;Neuromuscular re-education;Patient/family education;Manual techniques;Dry needling;Aquatic Therapy;Electrical Stimulation;Iontophoresis 63m/ml Dexamethasone;Gait training    PT Next Visit Plan  trunk, hip strengthening, lumbopelvic control, gait training, manual techniques, modalities PRN    Consulted and Agree with Plan of Care  Patient;Family member/caregiver    Family Member Consulted  husband       Patient will benefit from skilled therapeutic intervention in order to improve the following deficits and impairments:  Pain, Improper body mechanics, Postural dysfunction, Decreased strength, Difficulty walking, Decreased safety awareness, Decreased balance, Abnormal gait  Visit Diagnosis: Chronic bilateral low back pain with bilateral sciatica  Radiculopathy, lumbosacral region  Muscle weakness (generalized)  Difficulty in walking, not elsewhere classified     Problem List Patient Active Problem List   Diagnosis Date Noted  . Numbness 03/09/2018  . Lumbar spondylosis 03/07/2018  . Chronic pain of left knee 03/07/2018  . Chronic upper back pain 12/08/2017  . Benign neoplasm of hand 10/20/2017  . Chronic pain syndrome 10/20/2017  . Chronic pain of right lower extremity 10/20/2017  . Chronic myofascial pain 10/20/2017  .  Numbness and tingling 07/12/2017  . Sleep difficulties 07/12/2017  . Low back pain 01/18/2017  . Sleep behavior disorder, REM 01/18/2017  . Lumbar radiculopathy 11/01/2016  . Follicular lymphoma of extranodal and solid organ sites ([facility] 05/01/2016  . Lymphoma ([place] 04/30/2016  . Hallux limitus 07/27/2015  . Low back pain with sciatica 02/13/2015  . Bilateral low back pain with sciatica 02/13/2015  . H/O adenomatous polyp of colon 04/06/2014  . Hx of adenomatous colonic polyps 04/06/2014  . Back ache 01/25/2014  . Adiposity 01/25/2014  . REM sleep behavior disorder 01/25/2014  . Disordered sleep 01/25/2014  . Has a tremor 01/25/2014  . Obesity, unspecified 01/25/2014  . Sleep disorder 01/25/2014  . Backache 01/25/2014  . Tremor 01/25/2014  . Obesity 01/25/2014  . Difficulty hearing 06/08/2012  . Hearing loss 06/08/2012  . Anxiety 06/06/2012  . Colon polyp 06/06/2012  . Clinical depression 06/06/2012  . Hypercholesteremia 06/06/2012  . Idiopathic Parkinson's disease ([place] 06/06/2012  . Detached retina 06/06/2012  . Depression 06/06/2012  . Retinal detachment 06/06/2012  . Parkinson's disease ([place] 06/06/2012    MJoneen BoersPT, DPT  05/30/2018, 3:09 PM  [facility] [address] [place] [address]Phone: [phone]  Fax:  [phone] Name: JKHAI TORBERTMRN: [id number] of Birth: 401/05/1951

## 2018-06-01 ENCOUNTER — Ambulatory Visit: Payer: Medicare Other

## 2018-06-01 DIAGNOSIS — M5441 Lumbago with sciatica, right side: Principal | ICD-10-CM

## 2018-06-01 DIAGNOSIS — R262 Difficulty in walking, not elsewhere classified: Secondary | ICD-10-CM | POA: Diagnosis not present

## 2018-06-01 DIAGNOSIS — M5442 Lumbago with sciatica, left side: Secondary | ICD-10-CM | POA: Diagnosis not present

## 2018-06-01 DIAGNOSIS — M6281 Muscle weakness (generalized): Secondary | ICD-10-CM | POA: Diagnosis not present

## 2018-06-01 DIAGNOSIS — M5417 Radiculopathy, lumbosacral region: Secondary | ICD-10-CM

## 2018-06-01 DIAGNOSIS — G8929 Other chronic pain: Secondary | ICD-10-CM

## 2018-06-01 NOTE — Therapy (Signed)
[facility] [address], [place], [address] Phone: [phone]   Fax:  [phone]  Physical Therapy Treatment  Patient Details  Name: Sarah Donaldson MRN: [id number] Date of Birth: [birth year] Referring Provider (PT): Gurney Maxin, MD   Encounter Date: 06/01/2018  PT End of Session - 06/01/18 1117    Visit Number  13    Number of Visits  29    Date for PT Re-Evaluation  06/30/18    Authorization Type  4    Authorization Time Period  of 10 progress report    PT Start Time  1118    PT Stop Time  1202    PT Time Calculation (min)  44 min    Equipment Utilized During Treatment  Gait belt    Activity Tolerance  Patient limited by pain;Patient tolerated treatment well    Behavior During Therapy  Restless;Impulsive;WFL for tasks assessed/performed       Past Medical History:  Diagnosis Date  . Cancer ([place])    Non-hodgkin's lymphoma (in remission)  . Knee joint cyst, left 02/07/2018  . Parkinson disease ([place])   . Parkinson disease [facility])     Past Surgical History:  Procedure Laterality Date  . BACK SURGERY    . BUNIONECTOMY    . NASAL SEPTUM SURGERY    . RETINAL DETACHMENT SURGERY      There were no vitals filed for this visit.  Subjective Assessment - 06/01/18 1119    Subjective  Took her Parkinson's medicine 15 minutes ago. Back is sore 8/10 soreness currently (pt sitting). Also did cleaning at home which does not help. Back bothers her when she bends over.     Pertinent History  Chronic back pain. Has had Parkinsons since she was 67 years old. Has had back pain for years. Worsened around 2 years ago and feels cramping as well as pins and needles R medial and lateral thigh as well as L thigh.  Pt states that [name] increased last week. Difficulty bending her L leg and to kneel due to pain. Wears a knee brace to help with the swelling.   Pt states that when she walks on her L LE and steps  on it, it gives way which started around the past 2 days.   Pt states that her back pain has gotten worse since 2 years ago.  Walking makes her legs feel heavy. Pt uses her SPC sporadically when her legs bother her.  Denies loss of bowel or bladder control.  Pt unsure if she has tingling or numness bilateral inner thigh.  Pt also states having Non-Hodgkins lymphoma cancer.   Pt states tinlging and numbness bilateral posterior LE going down to her feet.   Pt also states falling 3 times within the past 6 months.  Pt tripped on a stool, pt also was running and in a hurry, did not watch where she was going and tripped on a basket. Pt also fell off the bed when she was sleeping.      Currently in Pain?  Yes    Pain Score  8                                PT Education - 06/01/18 1143    Education Details  ther-ex    Person(s) Educated  Patient    Methods  Explanation;Demonstration;Tactile cues;Verbal cues  Comprehension  Returned demonstration;Verbalized understanding      Objectives  pt observed to be more steady today and better able to ambulate without use of AD in which pt taking her Parkinson's medications may play a factor.   Therapeutic exercise   CGA with gait belt with standing exercises.   Seated physioball rolls  Flexion 10x  Then to the R 10x   No decrease in pain.   Seated rows resisting yellow band 10x3  Standing mini squats with B UE assist, emphasis on maintaining her center of gravity over base of support 10x3  Side stepping 5 ft to the R and 5 ft to the L with B UE assist 5x to promote glute med muscle use  standing B shoulder low rows yellow band 10x5 seconds for 2 sets   Seated manually resisted R trunk side bend isometrics with backunsupported10x5seconds for 3 sets  Seated R trunk rotation isometrics (to decrease L trunk rotation posture) with PT manual resistance.10x3 with 5 second holds   Standing straight pallof press  double yellow band 10x5 seconds for 3 sets   Standing hip abduction 10x3 each LE with B UE assist  Seated trunk flexion isometrics with PT manual resistance 10x5 seconds for 2 sets.   Try Nustep next visit again if appropriate.   Improved exercise technique, movement at target joints, use of target muscles after mod verbal, visual, tactile cues. Min to mod cues to decrease L lateral shift   Decreased back soreness after session per pt. Continued working on decreasing L lateral lean, and L trunk rotation, improving trunk and hip strength to decrease pressure to low back. Pt better able to control movements as well as better able to ambulate more steadily today in which her Parkinson's medication taking into effect may play a factor.  Pt will benefit from continued skilled physical therapy services to decrease back pain and improve function.      PT Short Term Goals - 05/16/18 1224      PT SHORT TERM GOAL #1   Title  Patient will be independent with her HEP to promote trunk and LE strength, and to help decrease back and bilateral LE pain.     Time  3    Period  Weeks    Status  On-going    Target Date  06/09/18        PT Long Term Goals - 05/16/18 1224      PT LONG TERM GOAL #1   Title  Patient will have a decrease in back pain to 5/10 or less at worst to promote ability to ambulate, peform standing tasks more comfortably.     Baseline  10/10 back pain at worst for the past 3 months. (03/24/2018); 9/10 at worst at night, 4-5/10 during the daytime (05/16/2018)    Time  6    Period  Weeks    Status  On-going    Target Date  06/30/18      PT LONG TERM GOAL #2   Title  Patient will improve her lumbar spine FOTO score by at least 10 points as a demonstration of improved function.     Baseline  Lumbar spine FOTO: 37 (03/24/2018); 54 (05/16/2018)    Time  6    Period  Weeks    Status  Achieved    Target Date  06/30/18      PT LONG TERM GOAL #3   Title  Pt will improve bilateral  hip abduction and extension strength  by at least 1/2 MMT grade to promote ability to perform standing tasks with less back pain.     Time  6    Period  Weeks    Status  Partially Met    Target Date  06/30/18            Plan - 06/01/18 1117    Clinical Impression Statement  Decreased back soreness after session per pt. Continued working on decreasing L lateral lean, and L trunk rotation, improving trunk and hip strength to decrease pressure to low back. Pt better able to control movements as well as better able to ambulate more steadily today in which her Parkinson's medication taking into effect may play a factor.  Pt will benefit from continued skilled physical therapy services to decrease back pain and improve function.     Rehab Potential  Fair    Clinical Impairments Affecting Rehab Potential  (-) Chronicity of back pain, B LE pain, Parkinson's, age; (+) motivated    PT Frequency  2x / week    PT Duration  6 weeks    PT Treatment/Interventions  Therapeutic activities;Therapeutic exercise;Balance training;Neuromuscular re-education;Patient/family education;Manual techniques;Dry needling;Aquatic Therapy;Electrical Stimulation;Iontophoresis 43m/ml Dexamethasone;Gait training    PT Next Visit Plan  trunk, hip strengthening, lumbopelvic control, gait training, manual techniques, modalities PRN    Consulted and Agree with Plan of Care  Patient;Family member/caregiver    Family Member Consulted  husband       Patient will benefit from skilled therapeutic intervention in order to improve the following deficits and impairments:  Pain, Improper body mechanics, Postural dysfunction, Decreased strength, Difficulty walking, Decreased safety awareness, Decreased balance, Abnormal gait  Visit Diagnosis: Chronic bilateral low back pain with bilateral sciatica  Radiculopathy, lumbosacral region  Muscle weakness (generalized)  Difficulty in walking, not elsewhere classified     Problem  List Patient Active Problem List   Diagnosis Date Noted  . Numbness 03/09/2018  . Lumbar spondylosis 03/07/2018  . Chronic pain of left knee 03/07/2018  . Chronic upper back pain 12/08/2017  . Benign neoplasm of hand 10/20/2017  . Chronic pain syndrome 10/20/2017  . Chronic pain of right lower extremity 10/20/2017  . Chronic myofascial pain 10/20/2017  . Numbness and tingling 07/12/2017  . Sleep difficulties 07/12/2017  . Low back pain 01/18/2017  . Sleep behavior disorder, REM 01/18/2017  . Lumbar radiculopathy 11/01/2016  . Follicular lymphoma of extranodal and solid organ sites ([facility] 05/01/2016  . [place] 04/30/2016  . Hallux limitus 07/27/2015  . Low back pain with sciatica 02/13/2015  . Bilateral low back pain with sciatica 02/13/2015  . H/O adenomatous polyp of colon 04/06/2014  . Hx of adenomatous colonic polyps 04/06/2014  . Back ache 01/25/2014  . Adiposity 01/25/2014  . REM sleep behavior disorder 01/25/2014  . Disordered sleep 01/25/2014  . Has a tremor 01/25/2014  . Obesity, unspecified 01/25/2014  . Sleep disorder 01/25/2014  . Backache 01/25/2014  . Tremor 01/25/2014  . Obesity 01/25/2014  . Difficulty hearing 06/08/2012  . Hearing loss 06/08/2012  . Anxiety 06/06/2012  . Colon polyp 06/06/2012  . Clinical depression 06/06/2012  . Hypercholesteremia 06/06/2012  . Idiopathic Parkinson's disease ([place] 06/06/2012  . Detached retina 06/06/2012  . Depression 06/06/2012  . Retinal detachment 06/06/2012  . Parkinson's disease ([facility] 06/06/2012    MJoneen BoersPT, DPT   06/01/2018, 8:26 PM  [facility] [address] [place] [address]Phone: [phone]  Fax:  [phone]  Name: Sarah Donaldson MRN: [id number] Date of Birth: Feb 21, 1951

## 2018-06-02 ENCOUNTER — Ambulatory Visit: Payer: Medicare Other | Attending: Nurse Practitioner | Admitting: Nurse Practitioner

## 2018-06-02 ENCOUNTER — Other Ambulatory Visit: Payer: Self-pay

## 2018-06-02 ENCOUNTER — Encounter: Payer: Self-pay | Admitting: Nurse Practitioner

## 2018-06-02 VITALS — BP 111/86 | HR 84 | Temp 97.6°F | Ht 66.0 in | Wt 163.0 lb

## 2018-06-02 DIAGNOSIS — Z79899 Other long term (current) drug therapy: Secondary | ICD-10-CM | POA: Diagnosis not present

## 2018-06-02 DIAGNOSIS — M79604 Pain in right leg: Secondary | ICD-10-CM | POA: Diagnosis not present

## 2018-06-02 DIAGNOSIS — Z888 Allergy status to other drugs, medicaments and biological substances status: Secondary | ICD-10-CM | POA: Diagnosis not present

## 2018-06-02 DIAGNOSIS — F419 Anxiety disorder, unspecified: Secondary | ICD-10-CM | POA: Insufficient documentation

## 2018-06-02 DIAGNOSIS — Z87891 Personal history of nicotine dependence: Secondary | ICD-10-CM | POA: Insufficient documentation

## 2018-06-02 DIAGNOSIS — F329 Major depressive disorder, single episode, unspecified: Secondary | ICD-10-CM | POA: Insufficient documentation

## 2018-06-02 DIAGNOSIS — G894 Chronic pain syndrome: Secondary | ICD-10-CM | POA: Diagnosis not present

## 2018-06-02 DIAGNOSIS — Z791 Long term (current) use of non-steroidal anti-inflammatories (NSAID): Secondary | ICD-10-CM | POA: Diagnosis not present

## 2018-06-02 DIAGNOSIS — G20 Parkinson's disease: Secondary | ICD-10-CM | POA: Insufficient documentation

## 2018-06-02 DIAGNOSIS — M4726 Other spondylosis with radiculopathy, lumbar region: Secondary | ICD-10-CM | POA: Insufficient documentation

## 2018-06-02 DIAGNOSIS — G8929 Other chronic pain: Secondary | ICD-10-CM

## 2018-06-02 DIAGNOSIS — M47816 Spondylosis without myelopathy or radiculopathy, lumbar region: Secondary | ICD-10-CM

## 2018-06-02 DIAGNOSIS — M7918 Myalgia, other site: Secondary | ICD-10-CM

## 2018-06-02 MED ORDER — PREGABALIN 25 MG PO CAPS: 25.0000 mg | ORAL_CAPSULE | Freq: Four times a day (QID) | ORAL | 2 refills | Status: DC

## 2018-06-02 MED ORDER — NAPROXEN 500 MG PO TABS: 500.0000 mg | ORAL_TABLET | Freq: Every day | ORAL | 2 refills | Status: AC

## 2018-06-02 NOTE — Progress Notes (Signed)
Patient's Name: Sarah Donaldson  MRN: [id number]  Referring Provider: Cletis Athens, MD  DOB: [birth year]  PCP: Cletis Athens, MD  DOS: 06/02/2018  Note by: Vevelyn Francois NP  Service setting: Ambulatory outpatient  Specialty: Interventional Pain Management  Location: [facility] (AMB) Pain Management Facility    Patient type: Established    Primary Reason(s) for Visit: Encounter for prescription drug management. (Level of risk: moderate)  CC: Back Pain  HPI  Sarah Donaldson is a 67 y.o. year old, adult patient, who comes today for a medication management evaluation. She has Anxiety; Back ache; Low back pain with sciatica; Colon polyp; Clinical depression; Difficulty hearing; H/O adenomatous polyp of colon; Hypercholesteremia; Adiposity; Idiopathic Parkinson's disease ([place]); REM sleep behavior disorder; Detached retina; Disordered sleep; Has a tremor; Hallux limitus; Lymphoma ([place]); Follicular lymphoma of extranodal and solid organ sites [facility]); Benign neoplasm of hand; Hearing loss; Numbness and tingling; Sleep difficulties; Chronic pain syndrome; Chronic pain of right lower extremity; Chronic myofascial pain; Obesity, unspecified; Sleep disorder; Backache; Bilateral low back pain with sciatica; Low back pain; Lumbar radiculopathy; Depression; Retinal detachment; Hx of adenomatous colonic polyps; Tremor; Parkinson's disease ([place]); Sleep behavior disorder, REM; Chronic upper back pain; Lumbar spondylosis; Chronic pain of left knee; Numbness; and Obesity on their problem list. Her primarily concern today is the Back Pain  Pain Assessment: Location: Lower Back Radiating: pain radiaties down right leg Onset: More than a month ago Duration: Chronic pain Quality: Cramping, Tightness, Squeezing Severity: 6 /10 (subjective, self-reported pain score)  Note: Reported level is compatible with observation. Clinically the patient looks like a 1/10 A 1/10 is viewed as "Mild" and described as nagging, annoying, but not  interfering with basic activities of daily living (ADL). Sarah Donaldson is able to eat, bathe, get dressed, do toileting (being able to get on and off the toilet and perform personal hygiene functions), transfer (move in and out of bed or a chair without assistance), and maintain continence (able to control bladder and bowel functions). Physiologic parameters such as blood pressure and heart rate apear wnl. Sarah Donaldson does not seem to understand the use of our objective pain scale When using our objective Pain Scale, levels between 6 and 10/10 are said to belong in an emergency room, as it progressively worsens from a 6/10, described as severely limiting, requiring emergency care not usually available at an outpatient pain management facility. At a 6/10 level, communication becomes difficult and requires great effort. Assistance to reach the emergency department may be required. Facial flushing and profuse sweating along with potentially dangerous increases in heart rate and blood pressure will be evident. Effect on ADL: limits my daily activites Timing: Constant Modifying factors: heat and sit down BP: 111/86  HR: 84  Sarah Donaldson was last scheduled for an appointment on 04/21/2018 for medication management. During today's appointment we reviewed Sarah Donaldson's chronic pain status, as well as her outpatient medication regimen.  She admits that when she takes her medication her pain is mild.  She is like the meloxicam was the cause of her oversedation.  She uses naproxen occasionally which seems to be effective  The patient  reports that she does not use drugs. Her body mass index is 26.31 kg/m.  Further details on both, my assessment(s), as well as the proposed treatment plan, please see below.  Laboratory Chemistry  Inflammation Markers (CRP: Acute Phase) (ESR: Chronic Phase) Lab Results  Component Value Date   ESRSEDRATE 4 06/07/2013  Rheumatology Markers No results found for:  RF, ANA, LABURIC, URICUR, LYMEIGGIGMAB, LYMEABIGMQN, HLAB27                      Renal Function Markers Lab Results  Component Value Date   BUN 23 05/20/2018   CREATININE 0.41 (L) 05/20/2018   GFRAA >60 05/20/2018   GFRNONAA >60 05/20/2018                             Hepatic Function Markers Lab Results  Component Value Date   AST 15 05/20/2018   ALT <5 05/20/2018   ALBUMIN 3.8 05/20/2018   ALKPHOS 95 05/20/2018                        Electrolytes Lab Results  Component Value Date   NA 144 05/20/2018   K 4.0 05/20/2018   CL 109 05/20/2018   CALCIUM 9.1 05/20/2018                        Neuropathy Markers No results found for: VITAMINB12, FOLATE, HGBA1C, HIV                      CNS Tests No results found for: COLORCSF, APPEARCSF, RBCCOUNTCSF, WBCCSF, POLYSCSF, LYMPHSCSF, EOSCSF, PROTEINCSF, GLUCCSF, JCVIRUS, CSFOLI, IGGCSF                      Bone Pathology Markers No results found for: VD25OH, [id number], G2877219, WT8882CM0, 25OHVITD1, 25OHVITD2, 25OHVITD3, TESTOFREE, TESTOSTERONE                       Coagulation Parameters Lab Results  Component Value Date   PLT 165 05/20/2018                        Cardiovascular Markers Lab Results  Component Value Date   HGB 12.1 05/20/2018   HCT 39.6 05/20/2018                         CA Markers No results found for: CEA, CA125, LABCA2                      Note: Lab results reviewed.  Recent Diagnostic Imaging Results  MM DIAG BREAST TOMO UNI LEFT CLINICAL DATA:  The patient was called back for possible distortion in the left breast.  EXAM: DIGITAL DIAGNOSTIC UNILATERAL LEFT MAMMOGRAM WITH CAD AND TOMO  COMPARISON:  Previous exam(s).  ACR Breast Density Category b: There are scattered areas of fibroglandular density.  FINDINGS: Possible distortion resolves on additional imaging.  Mammographic images were processed with CAD.  IMPRESSION: No evidence of malignancy in the left  breast.  RECOMMENDATION: Annual screening mammography.  I have discussed the findings and recommendations with the patient. Results were also provided in writing at the conclusion of the visit. If applicable, a reminder letter will be sent to the patient regarding the next appointment.  BI-RADS CATEGORY  1: Negative.  Electronically Signed   By: Dorise Bullion III M.D   On: 05/25/2018 14:12  Complexity Note: Imaging results reviewed. Results shared with Ms. Malicoat, using Layman's terms.  Meds   Current Outpatient Medications:  .  acetaminophen (TYLENOL) 650 MG CR tablet, Take 650 mg by mouth 2 (two) times daily., Disp: , Rfl:  .  carbidopa-levodopa (SINEMET IR) 25-100 MG tablet, Take 1 tablet by mouth 5 (five) times daily., Disp: , Rfl:  .  clonazePAM (KLONOPIN) 2 MG tablet, Take 1-3 mg by mouth See admin instructions. 1 mg every morning and 3 mg at bedtime, Disp: , Rfl:  .  furosemide (LASIX) 40 MG tablet, Take 20 mg by mouth 2 (two) times a week. Every Sunday and every Wednesday/Thursday, Disp: , Rfl:  .  naproxen (NAPROSYN) 500 MG tablet, Take 1 tablet (500 mg total) by mouth daily., Disp: 30 tablet, Rfl: 2 .  neomycin-polymyxin b-dexamethasone (MAXITROL) 3.8-25053-9.7 OINT, 1 application., Disp: , Rfl:  .  omeprazole (PRILOSEC) 20 MG capsule, Take 1 capsule (20 mg total) by mouth daily., Disp: 90 capsule, Rfl: 0 .  pramipexole (MIRAPEX) 1.5 MG tablet, Take 1.5 mg by mouth 3 (three) times daily., Disp: , Rfl: 5 .  prednisoLONE acetate (PRED FORTE) 1 % ophthalmic suspension, Place 1 drop into the left eye daily., Disp: , Rfl: 5 .  pregabalin (LYRICA) 25 MG capsule, Take 1 capsule (25 mg total) by mouth 4 (four) times daily., Disp: 120 capsule, Rfl: 2 .  promethazine (PHENERGAN) 12.5 MG tablet, Take 12.5 mg by mouth every 6 (six) hours as needed for nausea or vomiting., Disp: , Rfl:  .  ranitidine (ZANTAC) 150 MG capsule, Take 150 mg by mouth daily. , Disp: ,  Rfl:  .  selegiline (ELDEPRYL) 5 MG capsule, Take 5 mg by mouth 2 (two) times daily with breakfast and lunch. , Disp: , Rfl: 1 .  traZODone (DESYREL) 50 MG tablet, Take 50-100 mg by mouth at bedtime as needed for sleep. , Disp: , Rfl: 1  ROS  Constitutional: Denies any fever or chills Gastrointestinal: No reported hemesis, hematochezia, vomiting, or acute GI distress Musculoskeletal: Denies any acute onset joint swelling, redness, loss of ROM, or weakness Neurological: No reported episodes of acute onset apraxia, aphasia, dysarthria, agnosia, amnesia, paralysis, loss of coordination, or loss of consciousness  Allergies  Ms. Nygren is allergic to codeine sulfate and gabapentin.  [place]  Drug: Ms. Hoch  reports that she does not use drugs. Alcohol:  reports that she drinks alcohol. Tobacco:  reports that she has quit smoking. She has never used smokeless tobacco. Medical:  has a past medical history of Cancer [facility]), Knee joint cyst, left (02/07/2018), Parkinson disease ([place]), and Parkinson disease ([place]). Surgical: Ms. Lurry  has a past surgical history that includes Retinal detachment surgery; Bunionectomy; Nasal septum surgery; and Back surgery. Family: family history includes Heart disease in her mother.  Constitutional Exam  General appearance: Well nourished, well developed, and well hydrated. In no apparent acute distress Vitals:   06/02/18 1055  BP: 111/86  Pulse: 84  Temp: 97.6 F (36.4 C)  SpO2: 97%  Weight: 163 lb (73.9 kg)  Height: '5\' 6"'  (1.676 m)  Psych/Mental status: Alert, oriented x 3 (person, place, & time)       Eyes: PERLA Respiratory: No evidence of acute respiratory distress   Lumbar Spine Area Exam  Skin & Axial Inspection: No masses, redness, or swelling Alignment: Symmetrical Functional ROM: Adequate ROM       Stability: No instability detected Muscle Tone/Strength: Functionally intact. No obvious neuro-muscular anomalies detected. Sensory  (Neurological): Unimpaired Palpation: Tender         Gait &  Posture Assessment  Ambulation: Unassisted Gait: Awkward Posture: WNL   Lower Extremity Exam    Side: Right lower extremity  Side: Left lower extremity  Stability: No instability observed          Stability: No instability observed          Skin & Extremity Inspection: Skin color, temperature, and hair growth are WNL. No peripheral edema or cyanosis. No masses, redness, swelling, asymmetry, or associated skin lesions. No contractures.  Skin & Extremity Inspection: Skin color, temperature, and hair growth are WNL. No peripheral edema or cyanosis. No masses, redness, swelling, asymmetry, or associated skin lesions. No contractures.  Functional ROM: Unrestricted ROM                  Functional ROM: Unrestricted ROM                  Muscle Tone/Strength: Functionally intact. No obvious neuro-muscular anomalies detected.  Muscle Tone/Strength: Functionally intact. No obvious neuro-muscular anomalies detected.  Sensory (Neurological): Unimpaired  Sensory (Neurological): Unimpaired  Palpation: No palpable anomalies  Palpation: No palpable anomalies   Assessment  Primary Diagnosis & Pertinent Problem List: The primary encounter diagnosis was Lumbar spondylosis. Diagnoses of Chronic myofascial pain, Chronic pain of right lower extremity, and Chronic pain syndrome were also pertinent to this visit.  Status Diagnosis  Stable Stable Persistent 1. Lumbar spondylosis   2. Chronic myofascial pain   3. Chronic pain of right lower extremity   4. Chronic pain syndrome     Problems updated and reviewed during this visit: No problems updated. Plan of Care  Pharmacotherapy (Medications Ordered): Meds ordered this encounter  Medications  . pregabalin (LYRICA) 25 MG capsule    Sig: Take 1 capsule (25 mg total) by mouth 4 (four) times daily.    Dispense:  120 capsule    Refill:  2    Do not place this medication, or any other prescription  from our practice, on "Automatic Refill". Patient may have prescription filled one day early if pharmacy is closed on scheduled refill date.    Order Specific Question:   Supervising Provider    Answer:   Milinda Pointer [phone]  . naproxen (NAPROSYN) 500 MG tablet    Sig: Take 1 tablet (500 mg total) by mouth daily.    Dispense:  30 tablet    Refill:  2    Order Specific Question:   Supervising Provider    Answer:   Milinda Pointer [phone]   New Prescriptions   No medications on file   Medications administered today: [name] had no medications administered during this visit. Lab-work, procedure(s), and/or referral(s): No orders of the defined types were placed in this encounter.  Imaging and/or referral(s): None  Interventional therapies: Planned, scheduled, and/or pending:   Not at this time.    Provider-requested follow-up: Return in about 3 months (around 09/02/2018) for MedMgmt.  Future Appointments  Date Time Provider [place]  06/06/2018 11:15 AM Joneen Boers R, PT [facility] None  06/08/2018 11:15 AM Joneen Boers R, PT [facility] None  06/13/2018  3:15 PM Madaline Savage, PT [facility] None  06/15/2018  4:00 PM Madaline Savage, PT [facility] None  06/20/2018  3:15 PM Joneen Boers R, PT [facility] None  06/23/2018  1:30 PM Joneen Boers R, PT [facility] None  06/27/2018  1:00 PM Madaline Savage, PT [facility] None  06/29/2018  1:00 PM Madaline Savage, PT [facility] None  08/23/2018 11:00 AM Edison Pace,  Diona Foley, NP [facility] None  11/18/2018 10:00 AM [facility] None  11/18/2018 10:30 AM Brahmanday, Elisha Headland, MD [facility] None   Primary Care Physician: Cletis Athens, MD Location: [facility] Facility Note by: Vevelyn Francois NP Date: 06/02/2018; Time: 12:10 PM  Pain Score Disclaimer: We use the NRS-11 scale. This is a self-reported, subjective measurement of pain severity with only modest accuracy. It is used primarily to  identify changes within a particular patient. It must be understood that outpatient pain scales are significantly less accurate that those used for research, where they can be applied under ideal controlled circumstances with minimal exposure to variables. In reality, the score is likely to be a combination of pain intensity and pain affect, where pain affect describes the degree of emotional arousal or changes in action readiness caused by the sensory experience of pain. Factors such as social and work situation, setting, emotional state, anxiety levels, expectation, and prior pain experience may influence pain perception and show large inter-individual differences that may also be affected by time variables.  Patient instructions provided during this appointment: Patient Instructions   ____________________________________________________________________________________________  Pain Scale  Introduction: The pain score used by this practice is the Verbal Numerical Rating Scale (VNRS-11). This is an 11-point scale. It is for adults and children 10 years or older. There are significant differences in how the pain score is reported, used, and applied. Forget everything you learned in the past and learn this scoring system.  General Information: The scale should reflect your current level of pain. Unless you are specifically asked for the level of your worst pain, or your average pain. If you are asked for one of these two, then it should be understood that it is over the past 24 hours.  Basic Activities of Daily Living (ADL): Personal hygiene, dressing, eating, transferring, and using restroom.  Instructions: Most patients tend to report their level of pain as a combination of two factors, their physical pain and their psychosocial pain. This last one is also known as "suffering" and it is reflection of how physical pain affects you socially and psychologically. From now on, report them separately. From  this point on, when asked to report your pain level, report only your physical pain. Use the following table for reference.  Pain Clinic Pain Levels (0-5/10)  Pain Level Score  Description  No Pain 0   Mild pain 1 Nagging, annoying, but does not interfere with basic activities of daily living (ADL). Patients are able to eat, bathe, get dressed, toileting (being able to get on and off the toilet and perform personal hygiene functions), transfer (move in and out of bed or a chair without assistance), and maintain continence (able to control bladder and bowel functions). Blood pressure and heart rate are unaffected. A normal heart rate for a healthy adult ranges from 60 to 100 bpm (beats per minute).   Mild to moderate pain 2 Noticeable and distracting. Impossible to hide from other people. More frequent flare-ups. Still possible to adapt and function close to normal. It can be very annoying and may have occasional stronger flare-ups. With discipline, patients may get used to it and adapt.   Moderate pain 3 Interferes significantly with activities of daily living (ADL). It becomes difficult to feed, bathe, get dressed, get on and off the toilet or to perform personal hygiene functions. Difficult to get in and out of bed or a chair without assistance. Very distracting. With effort, it can be  ignored when deeply involved in activities.   Moderately severe pain 4 Impossible to ignore for more than a few minutes. With effort, patients may still be able to manage work or participate in some social activities. Very difficult to concentrate. Signs of autonomic nervous system discharge are evident: dilated pupils (mydriasis); mild sweating (diaphoresis); sleep interference. Heart rate becomes elevated (>115 bpm). Diastolic blood pressure (lower number) rises above 100 mmHg. Patients find relief in laying down and not moving.   Severe pain 5 Intense and extremely unpleasant. Associated with frowning face and  frequent crying. Pain overwhelms the senses.  Ability to do any activity or maintain social relationships becomes significantly limited. Conversation becomes difficult. Pacing back and forth is common, as getting into a comfortable position is nearly impossible. Pain wakes you up from deep sleep. Physical signs will be obvious: pupillary dilation; increased sweating; goosebumps; brisk reflexes; cold, clammy hands and feet; nausea, vomiting or dry heaves; loss of appetite; significant sleep disturbance with inability to fall asleep or to remain asleep. When persistent, significant weight loss is observed due to the complete loss of appetite and sleep deprivation.  Blood pressure and heart rate becomes significantly elevated. Caution: If elevated blood pressure triggers a pounding headache associated with blurred vision, then the patient should immediately seek attention at an urgent or emergency care unit, as these may be signs of an impending stroke.    Emergency Department Pain Levels (6-10/10)  Emergency Room Pain 6 Severely limiting. Requires emergency care and should not be seen or managed at an outpatient pain management facility. Communication becomes difficult and requires great effort. Assistance to reach the emergency department may be required. Facial flushing and profuse sweating along with potentially dangerous increases in heart rate and blood pressure will be evident.   Distressing pain 7 Self-care is very difficult. Assistance is required to transport, or use restroom. Assistance to reach the emergency department will be required. Tasks requiring coordination, such as bathing and getting dressed become very difficult.   Disabling pain 8 Self-care is no longer possible. At this level, pain is disabling. The individual is unable to do even the most "basic" activities such as walking, eating, bathing, dressing, transferring to a bed, or toileting. Fine motor skills are lost. It is difficult to  think clearly.   Incapacitating pain 9 Pain becomes incapacitating. Thought processing is no longer possible. Difficult to remember your own name. Control of movement and coordination are lost.   The worst pain imaginable 10 At this level, most patients pass out from pain. When this level is reached, collapse of the autonomic nervous system occurs, leading to a sudden drop in blood pressure and heart rate. This in turn results in a temporary and dramatic drop in blood flow to the brain, leading to a loss of consciousness. Fainting is one of the body's self defense mechanisms. Passing out puts the brain in a calmed state and causes it to shut down for a while, in order to begin the healing process.    Summary: 1. Refer to this scale when providing [place] with your pain level. 2. Be accurate and careful when reporting your pain level. This will help with your care. 3. Over-reporting your pain level will lead to loss of credibility. 4. Even a level of 1/10 means that there is pain and will be treated at our facility. 5. High, inaccurate reporting will be documented as "Symptom Exaggeration", leading to loss of credibility and suspicions of possible secondary gains such as  obtaining more narcotics, or wanting to appear disabled, for fraudulent reasons. 6. Only pain levels of 5 or below will be seen at our facility. 7. Pain levels of 6 and above will be sent to the Emergency Department and the appointment cancelled. ____________________________________________________________________________________________

## 2018-06-02 NOTE — Patient Instructions (Addendum)

## 2018-06-06 ENCOUNTER — Encounter: Payer: Self-pay | Admitting: Physical Therapy

## 2018-06-06 ENCOUNTER — Ambulatory Visit: Payer: Medicare Other | Admitting: Physical Therapy

## 2018-06-06 DIAGNOSIS — M5442 Lumbago with sciatica, left side: Principal | ICD-10-CM

## 2018-06-06 DIAGNOSIS — M6281 Muscle weakness (generalized): Secondary | ICD-10-CM | POA: Diagnosis not present

## 2018-06-06 DIAGNOSIS — M5417 Radiculopathy, lumbosacral region: Secondary | ICD-10-CM

## 2018-06-06 DIAGNOSIS — R262 Difficulty in walking, not elsewhere classified: Secondary | ICD-10-CM

## 2018-06-06 DIAGNOSIS — G8929 Other chronic pain: Secondary | ICD-10-CM

## 2018-06-06 DIAGNOSIS — M5441 Lumbago with sciatica, right side: Secondary | ICD-10-CM | POA: Diagnosis not present

## 2018-06-06 NOTE — Therapy (Signed)
[facility] [address] [place], [address] Phone: [phone]   Fax:  [phone]  Physical Therapy Treatment  Patient Details  Name: Sarah Donaldson MRN: [id number] Date of Birth: Apr 20, 1951 Referring Provider (PT): Gurney Maxin, MD   Encounter Date: 06/06/2018  PT End of Session - 06/06/18 1138    Visit Number  14    Number of Visits  29    Date for PT Re-Evaluation  06/30/18    Authorization Type  5    Authorization Time Period  of 10 progress report    PT Start Time  1125    PT Stop Time  1215    PT Time Calculation (min)  50 min    Equipment Utilized During Treatment  Gait belt    Activity Tolerance  Patient limited by pain;Patient tolerated treatment well    Behavior During Therapy  Restless;Impulsive;WFL for tasks assessed/performed       Past Medical History:  Diagnosis Date  . Cancer ([place])    Non-hodgkin's lymphoma (in remission)  . Knee joint cyst, left 02/07/2018  . Parkinson disease ([place])   . Parkinson disease [facility])     Past Surgical History:  Procedure Laterality Date  . BACK SURGERY    . BUNIONECTOMY    . NASAL SEPTUM SURGERY    . RETINAL DETACHMENT SURGERY      There were no vitals filed for this visit.  Subjective Assessment - 06/06/18 1135    Subjective  Patient states she just took her parkinson's medication 30 minutes ago. She states her pain is 6/10 today across lower back and right leg to the knee on the lateral side. She had 10/10 pain yesterday after sitting on a hard bench and cleaning her closet.     Pertinent History  Chronic back pain. Has had Parkinsons since she was 67 years old. Has had back pain for years. Worsened around 2 years ago and feels cramping as well as pins and needles R medial and lateral thigh as well as L thigh.  Pt states that [name] increased last week. Difficulty bending her L leg and to kneel due to pain. Wears a knee brace to help  with the swelling.   Pt states that when she walks on her L LE and steps on it, it gives way which started around the past 2 days.   Pt states that her back pain has gotten worse since 2 years ago.  Walking makes her legs feel heavy. Pt uses her SPC sporadically when her legs bother her.  Denies loss of bowel or bladder control.  Pt unsure if she has tingling or numness bilateral inner thigh.  Pt also states having Non-Hodgkins lymphoma cancer.   Pt states tinlging and numbness bilateral posterior LE going down to her feet.   Pt also states falling 3 times within the past 6 months.  Pt tripped on a stool, pt also was running and in a hurry, did not watch where she was going and tripped on a basket. Pt also fell off the bed when she was sleeping.      Patient Stated Goals  Be able to stand up straighter.     Currently in Pain?  Yes    Pain Score  6     Pain Location  Back    Pain Orientation  Lower    Pain Descriptors / Indicators  Other (Comment)   repeated numeric rating, discontinued  questioning due to wanting to redirect patien from focus on pain   Pain Type  Chronic pain    Pain Radiating Towards  radiates down right leg  to lateral knee    Pain Onset  More than a month ago    Pain Frequency  Constant        PT Education - 06/06/18 1134    Education Details  therex    Person(s) Educated  Patient    Methods  Explanation;Demonstration    Comprehension  Verbalized understanding;Returned demonstration      Objectives  pt observed to be more steady today and better able to ambulate without use of AD in which pt taking her Parkinson's medications may play a factor.   Therapeutic exercise   CGA with gait belt with standing exercises.   - NuStep level 1 using bilateral upper and lower extremities. Setting 8. For improved extremity mobility, muscular endurance, and activity tolerance; and to induce the analgesic effect of aerobic exercise, stimulate improved joint nutrition, and  prepare body structures and systems for following interventions. x 5  minutes. Subjective exam completed during exercise.   Seated physioball rolls             Flexion 10x             Then to the R 10x              No decrease in pain.   Seated rows resisting yellow band x15, RTB x15  Standing mini squats with B UE assist, emphasis on maintaining her center of gravity over base of support 10x3  Side stepping 5 ft to the R and 5 ft to the L with Yellow theraband and B UE assist 5x to promote glute med muscle use  Seated manually resisted R trunk side bend isometrics with backunsupported10x5seconds for 3 sets  Seated R trunk rotation isometrics (to decrease L trunk rotation posture) with PT manual resistance.10x3 with 5 second holds   Standing straight pallof press double yellow band 10x5 seconds for 3 sets              Standing hip abduction 10x3 each LE with yellow theraband looped around ankles and B UE assist  Seated trunk flexion isometrics with PT manual resistance 10x5 seconds for 2 sets.   Try Nustep next visit again if appropriate.   Improved exercise technique, movement at target joints, use of target muscles after mod verbal, visual, tactile cues. Min to mod cues to decrease L lateral shift  Decreased back soreness after session per pt. Continued working on decreasing L lateral lean, and L trunk rotation, improving trunk and hip strength to decrease pressure to low back. Pt able to tolerate mild progressions for some exercises. Patient demonstrated some reduced cognition and required constant supervision and repeated cuing to understand instructions.  Pt will benefit from continued skilled physical therapy services to decrease back pain and improve function.   PT Short Term Goals - 05/16/18 1224      PT SHORT TERM GOAL #1   Title  Patient will be independent with her HEP to promote trunk and LE strength, and to help decrease back and bilateral LE pain.      Time  3    Period  Weeks    Status  On-going    Target Date  06/09/18        PT Long Term Goals - 05/16/18 1224      PT LONG TERM GOAL #1  Title  Patient will have a decrease in back pain to 5/10 or less at worst to promote ability to ambulate, peform standing tasks more comfortably.     Baseline  10/10 back pain at worst for the past 3 months. (03/24/2018); 9/10 at worst at night, 4-5/10 during the daytime (05/16/2018)    Time  6    Period  Weeks    Status  On-going    Target Date  06/30/18      PT LONG TERM GOAL #2   Title  Patient will improve her lumbar spine FOTO score by at least 10 points as a demonstration of improved function.     Baseline  Lumbar spine FOTO: 37 (03/24/2018); 54 (05/16/2018)    Time  6    Period  Weeks    Status  Achieved    Target Date  06/30/18      PT LONG TERM GOAL #3   Title  Pt will improve bilateral hip abduction and extension strength by at least 1/2 MMT grade to promote ability to perform standing tasks with less back pain.     Time  6    Period  Weeks    Status  Partially Met    Target Date  06/30/18            Plan - 06/06/18 1139    Clinical Impression Statement  Decreased back soreness after session per pt. Continued working on decreasing L lateral lean, and L trunk rotation, improving trunk and hip strength to decrease pressure to low back. Pt able to tolerate mild progressions for some exercises. Patient demonstrated some reduced cognition and required constant supervision and repeated cuing to understand instructions.  Pt will benefit from continued skilled physical therapy services to decrease back pain and improve function.     Rehab Potential  Fair    Clinical Impairments Affecting Rehab Potential  (-) Chronicity of back pain, B LE pain, Parkinson's, age; (+) motivated    PT Frequency  2x / week    PT Duration  6 weeks    PT Treatment/Interventions  Therapeutic activities;Therapeutic exercise;Balance training;Neuromuscular  re-education;Patient/family education;Manual techniques;Dry needling;Aquatic Therapy;Electrical Stimulation;Iontophoresis 22m/ml Dexamethasone;Gait training    PT Next Visit Plan  trunk, hip strengthening, lumbopelvic control, gait training, manual techniques, modalities PRN    Consulted and Agree with Plan of Care  Patient;Family member/caregiver    Family Member Consulted  husband       Patient will benefit from skilled therapeutic intervention in order to improve the following deficits and impairments:  Pain, Improper body mechanics, Postural dysfunction, Decreased strength, Difficulty walking, Decreased safety awareness, Decreased balance, Abnormal gait  Visit Diagnosis: Chronic bilateral low back pain with bilateral sciatica  Radiculopathy, lumbosacral region  Muscle weakness (generalized)  Difficulty in walking, not elsewhere classified     Problem List Patient Active Problem List   Diagnosis Date Noted  . Numbness 03/09/2018  . Lumbar spondylosis 03/07/2018  . Chronic pain of left knee 03/07/2018  . Chronic upper back pain 12/08/2017  . Benign neoplasm of hand 10/20/2017  . Chronic pain syndrome 10/20/2017  . Chronic pain of right lower extremity 10/20/2017  . Chronic myofascial pain 10/20/2017  . Numbness and tingling 07/12/2017  . Sleep difficulties 07/12/2017  . Low back pain 01/18/2017  . Sleep behavior disorder, REM 01/18/2017  . Lumbar radiculopathy 11/01/2016  . Follicular lymphoma of extranodal and solid organ sites ([facility] 05/01/2016  . Lymphoma ([place] 04/30/2016  . Hallux limitus 07/27/2015  .  Low back pain with sciatica 02/13/2015  . Bilateral low back pain with sciatica 02/13/2015  . H/O adenomatous polyp of colon 04/06/2014  . Hx of adenomatous colonic polyps 04/06/2014  . Back ache 01/25/2014  . Adiposity 01/25/2014  . REM sleep behavior disorder 01/25/2014  . Disordered sleep 01/25/2014  . Has a tremor 01/25/2014  . Obesity, unspecified 01/25/2014   . Sleep disorder 01/25/2014  . Backache 01/25/2014  . Tremor 01/25/2014  . Obesity 01/25/2014  . Difficulty hearing 06/08/2012  . Hearing loss 06/08/2012  . Anxiety 06/06/2012  . Colon polyp 06/06/2012  . Clinical depression 06/06/2012  . Hypercholesteremia 06/06/2012  . Idiopathic Parkinson's disease ([place]) 06/06/2012  . Detached retina 06/06/2012  . Depression 06/06/2012  . Retinal detachment 06/06/2012  . Parkinson's disease ([place]) 06/06/2012    Nancy Nordmann, PT, DPT 06/06/2018, 2:22 PM  [facility] [address] [place], [address] Phone: [phone]   Fax:  [phone]  Name: GABRYELA KIMBRELL MRN: [id number] Date of Birth: 10/20/1950

## 2018-06-07 ENCOUNTER — Encounter: Payer: Self-pay | Admitting: Internal Medicine

## 2018-06-07 ENCOUNTER — Telehealth: Payer: Self-pay | Admitting: Internal Medicine

## 2018-06-07 ENCOUNTER — Telehealth: Payer: Self-pay | Admitting: Student in an Organized Health Care Education/Training Program

## 2018-06-07 NOTE — Telephone Encounter (Signed)
error 

## 2018-06-07 NOTE — Telephone Encounter (Signed)
Left a message for the patient's husband as requested. Also, sent my chart message.

## 2018-06-08 ENCOUNTER — Ambulatory Visit: Payer: Medicare Other

## 2018-06-08 DIAGNOSIS — M7052 Other bursitis of knee, left knee: Secondary | ICD-10-CM | POA: Diagnosis not present

## 2018-06-08 DIAGNOSIS — M5441 Lumbago with sciatica, right side: Secondary | ICD-10-CM | POA: Diagnosis not present

## 2018-06-08 DIAGNOSIS — I10 Essential (primary) hypertension: Secondary | ICD-10-CM | POA: Diagnosis not present

## 2018-06-08 DIAGNOSIS — E034 Atrophy of thyroid (acquired): Secondary | ICD-10-CM | POA: Diagnosis not present

## 2018-06-08 DIAGNOSIS — M6281 Muscle weakness (generalized): Secondary | ICD-10-CM | POA: Diagnosis not present

## 2018-06-08 DIAGNOSIS — G8929 Other chronic pain: Secondary | ICD-10-CM

## 2018-06-08 DIAGNOSIS — R262 Difficulty in walking, not elsewhere classified: Secondary | ICD-10-CM | POA: Diagnosis not present

## 2018-06-08 DIAGNOSIS — E7849 Other hyperlipidemia: Secondary | ICD-10-CM | POA: Diagnosis not present

## 2018-06-08 DIAGNOSIS — Z0000 Encounter for general adult medical examination without abnormal findings: Secondary | ICD-10-CM | POA: Diagnosis not present

## 2018-06-08 DIAGNOSIS — R5381 Other malaise: Secondary | ICD-10-CM | POA: Diagnosis not present

## 2018-06-08 DIAGNOSIS — F419 Anxiety disorder, unspecified: Secondary | ICD-10-CM | POA: Diagnosis not present

## 2018-06-08 DIAGNOSIS — Z23 Encounter for immunization: Secondary | ICD-10-CM | POA: Diagnosis not present

## 2018-06-08 DIAGNOSIS — M5417 Radiculopathy, lumbosacral region: Secondary | ICD-10-CM | POA: Diagnosis not present

## 2018-06-08 DIAGNOSIS — M5442 Lumbago with sciatica, left side: Secondary | ICD-10-CM | POA: Diagnosis not present

## 2018-06-08 DIAGNOSIS — C8190 Hodgkin lymphoma, unspecified, unspecified site: Secondary | ICD-10-CM | POA: Diagnosis not present

## 2018-06-08 DIAGNOSIS — K219 Gastro-esophageal reflux disease without esophagitis: Secondary | ICD-10-CM | POA: Diagnosis not present

## 2018-06-08 NOTE — Therapy (Signed)
[place] [facility] [address] [place], [address] Phone: [phone]   Fax:  [phone]  Physical Therapy Treatment  Patient Details  Name: Sarah Donaldson MRN: [id number] Date of Birth: [birth year] Referring Provider (PT): Gurney Maxin, MD   Encounter Date: 06/08/2018  PT End of Session - 06/08/18 1119    Visit Number  15    Number of Visits  29    Date for PT Re-Evaluation  06/30/18    Authorization Type  6    Authorization Time Period  of 10 progress report    PT Start Time  1119    PT Stop Time  1206    PT Time Calculation (min)  47 min    Equipment Utilized During Treatment  Gait belt    Activity Tolerance  Patient limited by pain;Patient tolerated treatment well    Behavior During Therapy  Restless;Impulsive;WFL for tasks assessed/performed       Past Medical History:  Diagnosis Date  . Cancer ([place])    Non-hodgkin's lymphoma (in remission)  . Knee joint cyst, left 02/07/2018  . Parkinson disease ([place])   . Parkinson disease [facility])     Past Surgical History:  Procedure Laterality Date  . BACK SURGERY    . BUNIONECTOMY    . NASAL SEPTUM SURGERY    . RETINAL DETACHMENT SURGERY      There were no vitals filed for this visit.  Subjective Assessment - 06/08/18 1121    Subjective  Pt states feeling sore and tired. Muscles were sore after last session.     Pertinent History  Chronic back pain. Has had Parkinsons since she was 67 years old. Has had back pain for years. Worsened around 2 years ago and feels cramping as well as pins and needles R medial and lateral thigh as well as L thigh.  Pt states that [name] increased last week. Difficulty bending her L leg and to kneel due to pain. Wears a knee brace to help with the swelling.   Pt states that when she walks on her L LE and steps on it, it gives way which started around the past 2 days.   Pt states that her back pain has gotten worse since  2 years ago.  Walking makes her legs feel heavy. Pt uses her SPC sporadically when her legs bother her.  Denies loss of bowel or bladder control.  Pt unsure if she has tingling or numness bilateral inner thigh.  Pt also states having Non-Hodgkins lymphoma cancer.   Pt states tinlging and numbness bilateral posterior LE going down to her feet.   Pt also states falling 3 times within the past 6 months.  Pt tripped on a stool, pt also was running and in a hurry, did not watch where she was going and tripped on a basket. Pt also fell off the bed when she was sleeping.      Patient Stated Goals  Be able to stand up straighter.     Currently in Pain?  Yes    Pain Score  7     Pain Onset  More than a month ago                               PT Education - 06/08/18 1136    Education Details  ther-ex    [employer]) Educated  Patient    Methods  Explanation;Demonstration;Tactile cues;Verbal cues    Comprehension  Returned demonstration;Verbalized understanding        Objectives   Therapeutic exercise   CGA withgait belt withstanding exercises.   Seated rows resisting yellow band x15, RTB x15  Standing mini squats with B UE assist, emphasis on maintaining hercenter of gravityover base of support 10x3  R anterior thigh discomfort    Seated trunk flexion isometrics with PT manual resistance 10x5 seconds for2sets.   Seated bilateral scapular retraction 10x5 seconds for 3 sets  Side stepping 5 ft to the R and 5 ft to the L with Yellow theraband and B UE assist 5x to promote glute med muscle use  Standing hip abduction resisting yellow band around thighs with B UE assist 10x3 each side  Seated R trunk rotation isometrics (to decrease L trunk rotation posture) with PT manual resistance.10x3with 5 second holds   Standing LE leg press resisting blue band with B UE assist 10x3   Standing straight pallof press double yellow band 10x5 seconds for 3 sets  Cues to  not lean back  Decreased L lateral lean observed  No low back discomfort. R anterior lateral thigh muscle use/cramping sensation felt  - NuStep level 1 using bilateral upper and lower extremities. Setting 8 for 5  minutes. (unbilled)  Improved exercise technique, movement at target joints, use of target muscles after mod verbal, visual, tactile cues.    Continued with working standing hip and quadriceps strengthening exercises with emphasis on decreasing L lateral lean, backward lean, and L trunk rotation to promote ability to perform standing tasks/chores at home more comfortably. Also continued working on trunk strengthening and thoracic extension (scapular retractions) to help decrease stress and pressure to low back. Pt will benefit from continued skilled physical therapy services to decrease back pain, improve LE strength, posture, and function.       PT Short Term Goals - 05/16/18 1224      PT SHORT TERM GOAL #1   Title  Patient will be independent with her HEP to promote trunk and LE strength, and to help decrease back and bilateral LE pain.     Time  3    Period  Weeks    Status  On-going    Target Date  06/09/18        PT Long Term Goals - 05/16/18 1224      PT LONG TERM GOAL #1   Title  Patient will have a decrease in back pain to 5/10 or less at worst to promote ability to ambulate, peform standing tasks more comfortably.     Baseline  10/10 back pain at worst for the past 3 months. (03/24/2018); 9/10 at worst at night, 4-5/10 during the daytime (05/16/2018)    Time  6    Period  Weeks    Status  On-going    Target Date  06/30/18      PT LONG TERM GOAL #2   Title  Patient will improve her lumbar spine FOTO score by at least 10 points as a demonstration of improved function.     Baseline  Lumbar spine FOTO: 37 (03/24/2018); 54 (05/16/2018)    Time  6    Period  Weeks    Status  Achieved    Target Date  06/30/18      PT LONG TERM GOAL #3   Title  Pt will improve  bilateral hip abduction and extension strength by at least 1/2 MMT grade to promote ability to  perform standing tasks with less back pain.     Time  6    Period  Weeks    Status  Partially Met    Target Date  06/30/18            Plan - 06/08/18 1209    Clinical Impression Statement  Continued with working standing hip and quadriceps strengthening exercises with emphasis on decreasing L lateral lean, backward lean, and L trunk rotation to promote ability to perform standing tasks/chores at home more comfortably. Also continued working on trunk strengthening and thoracic extension (scapular retractions) to help decrease stress and pressure to low back. Pt will benefit from continued skilled physical therapy services to decrease back pain, improve LE strength, posture, and function.     Rehab Potential  Fair    Clinical Impairments Affecting Rehab Potential  (-) Chronicity of back pain, B LE pain, Parkinson's, age; (+) motivated    PT Frequency  2x / week    PT Duration  6 weeks    PT Treatment/Interventions  Therapeutic activities;Therapeutic exercise;Balance training;Neuromuscular re-education;Patient/family education;Manual techniques;Dry needling;Aquatic Therapy;Electrical Stimulation;Iontophoresis 61m/ml Dexamethasone;Gait training    PT Next Visit Plan  trunk, hip strengthening, lumbopelvic control, gait training, manual techniques, modalities PRN    Consulted and Agree with Plan of Care  Patient;Family member/caregiver    Family Member Consulted  husband       Patient will benefit from skilled therapeutic intervention in order to improve the following deficits and impairments:  Pain, Improper body mechanics, Postural dysfunction, Decreased strength, Difficulty walking, Decreased safety awareness, Decreased balance, Abnormal gait  Visit Diagnosis: Chronic bilateral low back pain with bilateral sciatica  Radiculopathy, lumbosacral region  Muscle weakness (generalized)  Difficulty  in walking, not elsewhere classified     Problem List Patient Active Problem List   Diagnosis Date Noted  . Numbness 03/09/2018  . Lumbar spondylosis 03/07/2018  . Chronic pain of left knee 03/07/2018  . Chronic upper back pain 12/08/2017  . Benign neoplasm of hand 10/20/2017  . Chronic pain syndrome 10/20/2017  . Chronic pain of right lower extremity 10/20/2017  . Chronic myofascial pain 10/20/2017  . Numbness and tingling 07/12/2017  . Sleep difficulties 07/12/2017  . Low back pain 01/18/2017  . Sleep behavior disorder, REM 01/18/2017  . Lumbar radiculopathy 11/01/2016  . Follicular lymphoma of extranodal and solid organ sites ([facility] 05/01/2016  . [place] 04/30/2016  . Hallux limitus 07/27/2015  . Low back pain with sciatica 02/13/2015  . Bilateral low back pain with sciatica 02/13/2015  . H/O adenomatous polyp of colon 04/06/2014  . Hx of adenomatous colonic polyps 04/06/2014  . Back ache 01/25/2014  . Adiposity 01/25/2014  . REM sleep behavior disorder 01/25/2014  . Disordered sleep 01/25/2014  . Has a tremor 01/25/2014  . Obesity, unspecified 01/25/2014  . Sleep disorder 01/25/2014  . Backache 01/25/2014  . Tremor 01/25/2014  . Obesity 01/25/2014  . Difficulty hearing 06/08/2012  . Hearing loss 06/08/2012  . Anxiety 06/06/2012  . Colon polyp 06/06/2012  . Clinical depression 06/06/2012  . Hypercholesteremia 06/06/2012  . [place]'s [place] 06/06/2012  . Detached retina 06/06/2012  . Depression 06/06/2012  . Retinal detachment 06/06/2012  . Parkinson's disease ([place] 06/06/2012    MJoneen BoersPT, DPT   06/08/2018, 12:12 PM  [facility] [address] [place] [address]Phone: [phone]  Fax:  [phone] Name: JJAZZMIN NEWBOLDMRN: [id number]  of Birth: 02-20-1951

## 2018-06-09 DIAGNOSIS — H2511 Age-related nuclear cataract, right eye: Secondary | ICD-10-CM | POA: Diagnosis not present

## 2018-06-13 ENCOUNTER — Ambulatory Visit: Payer: Medicare Other | Attending: Nurse Practitioner

## 2018-06-13 DIAGNOSIS — M5441 Lumbago with sciatica, right side: Secondary | ICD-10-CM | POA: Insufficient documentation

## 2018-06-13 DIAGNOSIS — M5417 Radiculopathy, lumbosacral region: Secondary | ICD-10-CM | POA: Diagnosis not present

## 2018-06-13 DIAGNOSIS — M6281 Muscle weakness (generalized): Secondary | ICD-10-CM | POA: Diagnosis not present

## 2018-06-13 DIAGNOSIS — G8929 Other chronic pain: Secondary | ICD-10-CM | POA: Diagnosis not present

## 2018-06-13 DIAGNOSIS — R262 Difficulty in walking, not elsewhere classified: Secondary | ICD-10-CM | POA: Diagnosis not present

## 2018-06-13 DIAGNOSIS — M5442 Lumbago with sciatica, left side: Secondary | ICD-10-CM | POA: Diagnosis not present

## 2018-06-13 NOTE — Therapy (Signed)
[facility] [address] [place], [address] Phone: [phone]   Fax:  [phone]  Physical Therapy Treatment  Patient Details  Name: Sarah Donaldson MRN: [id number] Date of Birth: 1951-02-12 Referring Provider (PT): Gurney Maxin, MD   Encounter Date: 06/13/2018  PT End of Session - 06/13/18 1545    Visit Number  16    Number of Visits  29    Date for PT Re-Evaluation  06/30/18    Authorization Type  7    Authorization Time Period  of 10 progress report    PT Start Time  9150   pt arrived late   PT Stop Time  1602    PT Time Calculation (min)  16 min    Equipment Utilized During Treatment  Gait belt    Activity Tolerance  Patient limited by pain;Patient tolerated treatment well    Behavior During Therapy  Restless;Impulsive;WFL for tasks assessed/performed       Past Medical History:  Diagnosis Date  . Cancer ([place])    Non-hodgkin's lymphoma (in remission)  . Knee joint cyst, left 02/07/2018  . Parkinson disease ([place])   . Parkinson disease [facility])     Past Surgical History:  Procedure Laterality Date  . BACK SURGERY    . BUNIONECTOMY    . NASAL SEPTUM SURGERY    . RETINAL DETACHMENT SURGERY      There were no vitals filed for this visit.  Subjective Assessment - 06/13/18 1603    Subjective  Pt states being late because her husband hand therapy just before today's session. 6-7/10 back pain currently.     Pertinent History  Chronic back pain. Has had Parkinsons since she was 67 years old. Has had back pain for years. Worsened around 2 years ago and feels cramping as well as pins and needles R medial and lateral thigh as well as L thigh.  Pt states that [name] increased last week. Difficulty bending her L leg and to kneel due to pain. Wears a knee brace to help with the swelling.   Pt states that when she walks on her L LE and steps on it, it gives way which started around the past 2  days.   Pt states that her back pain has gotten worse since 2 years ago.  Walking makes her legs feel heavy. Pt uses her SPC sporadically when her legs bother her.  Denies loss of bowel or bladder control.  Pt unsure if she has tingling or numness bilateral inner thigh.  Pt also states having Non-Hodgkins lymphoma cancer.   Pt states tinlging and numbness bilateral posterior LE going down to her feet.   Pt also states falling 3 times within the past 6 months.  Pt tripped on a stool, pt also was running and in a hurry, did not watch where she was going and tripped on a basket. Pt also fell off the bed when she was sleeping.      Patient Stated Goals  Be able to stand up straighter.     Currently in Pain?  Yes    Pain Score  7     Pain Location  Back    Pain Onset  More than a month ago                              Objectives    Therapeutic exercise  CGA withgait belt withstanding exercises.   Sitting on Air Ex pad manually resisted clamshell isometrics 10x2 with 5 seconds    Sitting on Air Ex pad on chair  Seated manually resisted trunk extension 10x5 seconds for 2 sets   No back pain in sitting after aforementioned exercises  Seated straight pallof press resisting yellow band 10x2 with 5 seconds   Standing straight pallor press resisting yellow band 10x5 seconds. Improved upright posture. Decreased L lateral and backward lean when performing exercise.   Give as part of her HEP next visit if appropriate   Improved exercise technique, movement at target joints, use of target muscles after mod verbal, visual, tactile cues.    Pt arrived late and session adjusted accordingly. Worked on glute med, trunk and gentle back extension strengthening. Decreased back pain after session. Pt will benefit from continued skilled physical therapy services to decreased pain, improve strength, and function.      PT Education - 06/13/18 1822    Education Details   ther-ex    [employer]) Educated  Patient    Methods  Explanation;Demonstration;Tactile cues;Verbal cues    Comprehension  Returned demonstration;Verbalized understanding       PT Short Term Goals - 05/16/18 1224      PT SHORT TERM GOAL #1   Title  Patient will be independent with her HEP to promote trunk and LE strength, and to help decrease back and bilateral LE pain.     Time  3    Period  Weeks    Status  On-going    Target Date  06/09/18        PT Long Term Goals - 05/16/18 1224      PT LONG TERM GOAL #1   Title  Patient will have a decrease in back pain to 5/10 or less at worst to promote ability to ambulate, peform standing tasks more comfortably.     Baseline  10/10 back pain at worst for the past 3 months. (03/24/2018); 9/10 at worst at night, 4-5/10 during the daytime (05/16/2018)    Time  6    Period  Weeks    Status  On-going    Target Date  06/30/18      PT LONG TERM GOAL #2   Title  Patient will improve her lumbar spine FOTO score by at least 10 points as a demonstration of improved function.     Baseline  Lumbar spine FOTO: 37 (03/24/2018); 54 (05/16/2018)    Time  6    Period  Weeks    Status  Achieved    Target Date  06/30/18      PT LONG TERM GOAL #3   Title  Pt will improve bilateral hip abduction and extension strength by at least 1/2 MMT grade to promote ability to perform standing tasks with less back pain.     Time  6    Period  Weeks    Status  Partially Met    Target Date  06/30/18            Plan - 06/13/18 1823    Clinical Impression Statement  Pt arrived late and session adjusted accordingly. Worked on glute med, trunk and gentle back extension strengthening. Decreased back pain after session. Pt will benefit from continued skilled physical therapy services to decreased pain, improve strength, and function.     Rehab Potential  Fair    Clinical Impairments Affecting Rehab Potential  (-) Chronicity of back pain, B LE  pain, Parkinson's, age;  (+) motivated    PT Frequency  2x / week    PT Duration  6 weeks    PT Treatment/Interventions  Therapeutic activities;Therapeutic exercise;Balance training;Neuromuscular re-education;Patient/family education;Manual techniques;Dry needling;Aquatic Therapy;Electrical Stimulation;Iontophoresis 24m/ml Dexamethasone;Gait training    PT Next Visit Plan  trunk, hip strengthening, lumbopelvic control, gait training, manual techniques, modalities PRN    Consulted and Agree with Plan of Care  Patient;Family member/caregiver    Family Member Consulted  husband       Patient will benefit from skilled therapeutic intervention in order to improve the following deficits and impairments:  Pain, Improper body mechanics, Postural dysfunction, Decreased strength, Difficulty walking, Decreased safety awareness, Decreased balance, Abnormal gait  Visit Diagnosis: Chronic bilateral low back pain with bilateral sciatica  Radiculopathy, lumbosacral region  Muscle weakness (generalized)  Difficulty in walking, not elsewhere classified     Problem List Patient Active Problem List   Diagnosis Date Noted  . Numbness 03/09/2018  . Lumbar spondylosis 03/07/2018  . Chronic pain of left knee 03/07/2018  . Chronic upper back pain 12/08/2017  . Benign neoplasm of hand 10/20/2017  . Chronic pain syndrome 10/20/2017  . Chronic pain of right lower extremity 10/20/2017  . Chronic myofascial pain 10/20/2017  . Numbness and tingling 07/12/2017  . Sleep difficulties 07/12/2017  . Low back pain 01/18/2017  . Sleep behavior disorder, REM 01/18/2017  . Lumbar radiculopathy 11/01/2016  . Follicular lymphoma of extranodal and solid organ sites ([facility] 05/01/2016  . Lymphoma ([place] 04/30/2016  . Hallux limitus 07/27/2015  . Low back pain with sciatica 02/13/2015  . Bilateral low back pain with sciatica 02/13/2015  . H/O adenomatous polyp of colon 04/06/2014  . Hx of adenomatous colonic polyps 04/06/2014  . Back ache  01/25/2014  . Adiposity 01/25/2014  . REM sleep behavior disorder 01/25/2014  . Disordered sleep 01/25/2014  . Has a tremor 01/25/2014  . Obesity, unspecified 01/25/2014  . Sleep disorder 01/25/2014  . Backache 01/25/2014  . Tremor 01/25/2014  . Obesity 01/25/2014  . Difficulty hearing 06/08/2012  . Hearing loss 06/08/2012  . Anxiety 06/06/2012  . Colon polyp 06/06/2012  . Clinical depression 06/06/2012  . Hypercholesteremia 06/06/2012  . Idiopathic Parkinson's disease ([place] 06/06/2012  . Detached retina 06/06/2012  . Depression 06/06/2012  . Retinal detachment 06/06/2012  . Parkinson's disease ([place] 06/06/2012    MJoneen BoersPT, DPT   06/13/2018, 6:28 PM  [facility] [address] [place] [address]Phone: [phone]  Fax:  [phone] Name: JHADLEE BURBACKMRN: [id number] of Birth: 408/17/1952

## 2018-06-14 DIAGNOSIS — K219 Gastro-esophageal reflux disease without esophagitis: Secondary | ICD-10-CM | POA: Diagnosis not present

## 2018-06-14 DIAGNOSIS — F419 Anxiety disorder, unspecified: Secondary | ICD-10-CM | POA: Diagnosis not present

## 2018-06-14 DIAGNOSIS — M7052 Other bursitis of knee, left knee: Secondary | ICD-10-CM | POA: Diagnosis not present

## 2018-06-14 DIAGNOSIS — C8190 Hodgkin lymphoma, unspecified, unspecified site: Secondary | ICD-10-CM | POA: Diagnosis not present

## 2018-06-15 ENCOUNTER — Ambulatory Visit: Payer: Medicare Other

## 2018-06-15 DIAGNOSIS — M6281 Muscle weakness (generalized): Secondary | ICD-10-CM

## 2018-06-15 DIAGNOSIS — G8929 Other chronic pain: Secondary | ICD-10-CM | POA: Diagnosis not present

## 2018-06-15 DIAGNOSIS — M5441 Lumbago with sciatica, right side: Principal | ICD-10-CM

## 2018-06-15 DIAGNOSIS — M5417 Radiculopathy, lumbosacral region: Secondary | ICD-10-CM | POA: Diagnosis not present

## 2018-06-15 DIAGNOSIS — R262 Difficulty in walking, not elsewhere classified: Secondary | ICD-10-CM

## 2018-06-15 DIAGNOSIS — M5442 Lumbago with sciatica, left side: Secondary | ICD-10-CM | POA: Diagnosis not present

## 2018-06-15 NOTE — Therapy (Signed)
[place] [facility] [address] [place], [address] Phone: [phone]   Fax:  [phone]  Physical Therapy Treatment  Patient Details  Name: Sarah Donaldson MRN: [id number] Date of Birth: [birth year] Referring Provider (PT): Gurney Maxin, MD   Encounter Date: 06/15/2018  PT End of Session - 06/15/18 1604    Visit Number  17    Number of Visits  29    Date for PT Re-Evaluation  06/30/18    Authorization Type  8    Authorization Time Period  of 10 progress report    PT Start Time  1604    PT Stop Time  1655    PT Time Calculation (min)  51 min    Equipment Utilized During Treatment  Gait belt    Activity Tolerance  Patient limited by pain;Patient tolerated treatment well    Behavior During Therapy  Restless;Impulsive;WFL for tasks assessed/performed       Past Medical History:  Diagnosis Date  . Cancer ([place])    Non-hodgkin's lymphoma (in remission)  . Knee joint cyst, left 02/07/2018  . Parkinson disease ([place])   . Parkinson disease [facility])     Past Surgical History:  Procedure Laterality Date  . BACK SURGERY    . BUNIONECTOMY    . NASAL SEPTUM SURGERY    . RETINAL DETACHMENT SURGERY      There were no vitals filed for this visit.  Subjective Assessment - 06/15/18 1606    Subjective  Back is bothering her. 7/10 currently, especially her L side. R low back is not bothering her at all.  Feels like the way she is sleeping bothers her back. Back pain usually bothers her starting 6-7 pm in the evening. Did not bother her until 11 pm yesterday.  Back did not really bother her as much in the evening after last session.     Pertinent History  Chronic back pain. Has had Parkinsons since she was 67 years old. Has had back pain for years. Worsened around 2 years ago and feels cramping as well as pins and needles R medial and lateral thigh as well as L thigh.  Pt states that [name] increased last week.  Difficulty bending her L leg and to kneel due to pain. Wears a knee brace to help with the swelling.   Pt states that when she walks on her L LE and steps on it, it gives way which started around the past 2 days.   Pt states that her back pain has gotten worse since 2 years ago.  Walking makes her legs feel heavy. Pt uses her SPC sporadically when her legs bother her.  Denies loss of bowel or bladder control.  Pt unsure if she has tingling or numness bilateral inner thigh.  Pt also states having Non-Hodgkins lymphoma cancer.   Pt states tinlging and numbness bilateral posterior LE going down to her feet.   Pt also states falling 3 times within the past 6 months.  Pt tripped on a stool, pt also was running and in a hurry, did not watch where she was going and tripped on a basket. Pt also fell off the bed when she was sleeping.      Patient Stated Goals  Be able to stand up straighter.     Currently in Pain?  Yes    Pain Score  7     Pain Onset  More than a month ago  PT Education - 06/15/18 1637    Education Details  ther-ex, HEP    Person(s) Educated  Patient    Methods  Explanation;Demonstration;Tactile cues;Verbal cues;Handout    Comprehension  Returned demonstration;Verbalized understanding        Objectives    Therapeutic exercise   CGA withgait belt withstanding exercises.   Sitting on Air Ex pad manually resisted clamshell isometrics 10x3 with 5 seconds  with strap  Reviewed and given as part of her HEP  to be performed with husband, manual resistance instead of belt for safety. Pt and husband demonstrated and verbalized understanding.   Sitting on Air Ex pad on chair:   Seated manually resisted trunk extension 10x5 seconds for 3 sets   Seated straight pallof press resisting yellow band 10x2 with 5 seconds  Reviewed and given as part of her HEP. Pt and husband demonstrated and verbalized understanding.    Standing straight pallof press resisting yellow band 10x2 with 5 second holds  Standing hip abduction with B UE assist 10x2 each LE  Then with 2 lbs ankle weight each LE 10x2  Standing small hip extension with B UE assist and 2 lbs ankle weight resistance 10x2 each LE    Seated on Air ex pad on chair   bilateral scapular retraction resisting red band 10x5 seconds for 3 sets   Improved exercise technique, movement at target joints, use of target muscles after mod verbal, visual, tactile cues.   Pt states back feeling better after session, especially the L side. Continued working on trunk, glute med, max, and scapular strengthening to help improve posture, and decrease stress to low back. Pt will benefit from continued skilled physical therapy services to decrease pain, and improve function.       PT Short Term Goals - 05/16/18 1224      PT SHORT TERM GOAL #1   Title  Patient will be independent with her HEP to promote trunk and LE strength, and to help decrease back and bilateral LE pain.     Time  3    Period  Weeks    Status  On-going    Target Date  06/09/18        PT Long Term Goals - 05/16/18 1224      PT LONG TERM GOAL #1   Title  Patient will have a decrease in back pain to 5/10 or less at worst to promote ability to ambulate, peform standing tasks more comfortably.     Baseline  10/10 back pain at worst for the past 3 months. (03/24/2018); 9/10 at worst at night, 4-5/10 during the daytime (05/16/2018)    Time  6    Period  Weeks    Status  On-going    Target Date  06/30/18      PT LONG TERM GOAL #2   Title  Patient will improve her lumbar spine FOTO score by at least 10 points as a demonstration of improved function.     Baseline  Lumbar spine FOTO: 37 (03/24/2018); 54 (05/16/2018)    Time  6    Period  Weeks    Status  Achieved    Target Date  06/30/18      PT LONG TERM GOAL #3   Title  Pt will improve bilateral hip abduction and extension strength by at  least 1/2 MMT grade to promote ability to perform standing tasks with less back pain.     Time  6    Period  Weeks    Status  Partially Met    Target Date  06/30/18            Plan - 06/15/18 1604    Clinical Impression Statement  Pt states back feeling better after session, especially the L side. Continued working on trunk, glute med, max, and scapular strengthening to help improve posture, and decrease stress to low back. Pt will benefit from continued skilled physical therapy services to decrease pain, and improve function.      Rehab Potential  Fair    Clinical Impairments Affecting Rehab Potential  (-) Chronicity of back pain, B LE pain, Parkinson's, age; (+) motivated    PT Frequency  2x / week    PT Duration  6 weeks    PT Treatment/Interventions  Therapeutic activities;Therapeutic exercise;Balance training;Neuromuscular re-education;Patient/family education;Manual techniques;Dry needling;Aquatic Therapy;Electrical Stimulation;Iontophoresis 23m/ml Dexamethasone;Gait training    PT Next Visit Plan  trunk, hip strengthening, lumbopelvic control, gait training, manual techniques, modalities PRN    Consulted and Agree with Plan of Care  Patient;Family member/caregiver    Family Member Consulted  husband       Patient will benefit from skilled therapeutic intervention in order to improve the following deficits and impairments:  Pain, Improper body mechanics, Postural dysfunction, Decreased strength, Difficulty walking, Decreased safety awareness, Decreased balance, Abnormal gait  Visit Diagnosis: Chronic bilateral low back pain with bilateral sciatica  Radiculopathy, lumbosacral region  Muscle weakness (generalized)  Difficulty in walking, not elsewhere classified     Problem List Patient Active Problem List   Diagnosis Date Noted  . Numbness 03/09/2018  . Lumbar spondylosis 03/07/2018  . Chronic pain of left knee 03/07/2018  . Chronic upper back pain 12/08/2017  .  Benign neoplasm of hand 10/20/2017  . Chronic pain syndrome 10/20/2017  . Chronic pain of right lower extremity 10/20/2017  . Chronic myofascial pain 10/20/2017  . Numbness and tingling 07/12/2017  . Sleep difficulties 07/12/2017  . Low back pain 01/18/2017  . Sleep behavior disorder, REM 01/18/2017  . Lumbar radiculopathy 11/01/2016  . Follicular lymphoma of extranodal and solid organ sites ([facility] 05/01/2016  . [place]oma ([place] 04/30/2016  . Hallux limitus 07/27/2015  . Low back pain with sciatica 02/13/2015  . Bilateral low back pain with sciatica 02/13/2015  . H/O adenomatous polyp of colon 04/06/2014  . Hx of adenomatous colonic polyps 04/06/2014  . Back ache 01/25/2014  . Adiposity 01/25/2014  . REM sleep behavior disorder 01/25/2014  . Disordered sleep 01/25/2014  . Has a tremor 01/25/2014  . Obesity, unspecified 01/25/2014  . Sleep disorder 01/25/2014  . Backache 01/25/2014  . Tremor 01/25/2014  . Obesity 01/25/2014  . Difficulty hearing 06/08/2012  . Hearing loss 06/08/2012  . Anxiety 06/06/2012  . Colon polyp 06/06/2012  . Clinical depression 06/06/2012  . Hypercholesteremia 06/06/2012  . Idiopathic [place]'s disease ([place] 06/06/2012  . Detached retina 06/06/2012  . Depression 06/06/2012  . Retinal detachment 06/06/2012  . Parkinson's disease ([place] 06/06/2012   MJoneen BoersPT, DPT   06/15/2018, 5:06 PM  [facility] [address] [place] [address]Phone: [phone]  Fax:  [phone] Name: JANABELLA CAPSHAWMRN: [id number] of Birth: [birth year]

## 2018-06-15 NOTE — Patient Instructions (Addendum)
  Seated clamshell isometrics  (to be performed with husband )  Sitting on your bed, your husband's hands at outside knees (neutral thigh position)  Press your thighs out against them at a pain free level of effort.   Hold for 5 seconds.   Repeat 10 times.   Perform 3 sets daily.      Reviewed and given seated straight pallof press resisting yellow band 10x3 with 5 second holds as part of her HEP. Pt and husband demonstrated and verbalized understanding. Handout provided.

## 2018-06-20 ENCOUNTER — Ambulatory Visit: Payer: Medicare Other

## 2018-06-20 DIAGNOSIS — R262 Difficulty in walking, not elsewhere classified: Secondary | ICD-10-CM | POA: Diagnosis not present

## 2018-06-20 DIAGNOSIS — M5441 Lumbago with sciatica, right side: Secondary | ICD-10-CM | POA: Diagnosis not present

## 2018-06-20 DIAGNOSIS — M5417 Radiculopathy, lumbosacral region: Secondary | ICD-10-CM | POA: Diagnosis not present

## 2018-06-20 DIAGNOSIS — M5442 Lumbago with sciatica, left side: Secondary | ICD-10-CM | POA: Diagnosis not present

## 2018-06-20 DIAGNOSIS — G8929 Other chronic pain: Secondary | ICD-10-CM | POA: Diagnosis not present

## 2018-06-20 DIAGNOSIS — M6281 Muscle weakness (generalized): Secondary | ICD-10-CM

## 2018-06-20 NOTE — Therapy (Signed)
[facility] [address] [place], [address] Phone: [phone]   Fax:  [phone]  Physical Therapy Treatment  Patient Details  Name: Sarah Donaldson MRN: [id number] Date of Birth: 1950-11-02 Referring Provider (PT): Gurney Maxin, MD   Encounter Date: 06/20/2018  PT End of Session - 06/20/18 1520    Visit Number  18    Number of Visits  29    Date for PT Re-Evaluation  06/30/18    Authorization Type  9    Authorization Time Period  of 10 progress report    PT Start Time  1521    PT Stop Time  1602    PT Time Calculation (min)  41 min    Equipment Utilized During Treatment  Gait belt    Activity Tolerance  Patient limited by pain;Patient tolerated treatment well    Behavior During Therapy  Restless;Impulsive;WFL for tasks assessed/performed       Past Medical History:  Diagnosis Date  . Cancer ([place])    Non-hodgkin's lymphoma (in remission)  . Knee joint cyst, left 02/07/2018  . Parkinson disease ([place])   . Parkinson disease [facility])     Past Surgical History:  Procedure Laterality Date  . BACK SURGERY    . BUNIONECTOMY    . NASAL SEPTUM SURGERY    . RETINAL DETACHMENT SURGERY      There were no vitals filed for this visit.  Subjective Assessment - 06/20/18 1522    Subjective  Pt states feeling low back soreness. Also leaned on a table that collapsed Saturday. Pt caught herself. Back was ok prior to her fall.  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     Pertinent History  Chronic back pain. Has had Parkinsons since she was 67 years old. Has had back pain for years. Worsened around 2 years ago and feels cramping as well as pins and needles R medial and lateral thigh as well as L thigh.  Pt states that [name] increased last week. Difficulty bending her L leg and to kneel due to pain. Wears a knee brace to help with the swelling.   Pt states that when she walks on  her L LE and steps on it, it gives way which started around the past 2 days.   Pt states that her back pain has gotten worse since 2 years ago.  Walking makes her legs feel heavy. Pt uses her SPC sporadically when her legs bother her.  Denies loss of bowel or bladder control.  Pt unsure if she has tingling or numness bilateral inner thigh.  Pt also states having Non-Hodgkins lymphoma cancer.   Pt states tinlging and numbness bilateral posterior LE going down to her feet.   Pt also states falling 3 times within the past 69 months.  Pt tripped on a stool, pt also was running and in a hurry, did not watch where she was going and tripped on a basket. Pt also fell off the bed when she was 67. when she was sleeping.      Patient Stated Goals  Be able to stand up straighter.     Currently in Pain?  Yes    Pain Score  9    8-9/10    Pain Onset  More than a month ago         [facility] PT Assessment - 06/20/18 1527      Strength   Right Hip Extension  4+/5   seated hip extension isometrics   Right Hip ABduction  4+/5   seated clamshell isometrics   Left Hip Extension  4/5   seated hip extension isometrics   Left Hip ABduction  4+/5   seated clamshell isometrics                          PT Education - 06/20/18 1531    Education Details  ther-ex    Person(s) Educated  Patient    Methods  Explanation;Demonstration;Tactile cues;Verbal cues    Comprehension  Returned demonstration;Verbalized understanding       Objectives    Therapeutic exercise   CGA withgait belt withstanding exercises.   Seated manually resisted hip extension and clamshells  Reviewed progress/current status with hip strength with pt.   Seated manually resisted clamshell isometrics 10x5 seconds, for 3 sets hips less than 90 degrees flexion   Seated manually resisted trunk flexion 10x5 seconds for 2 sets. R lateral thigh pins and needles (along L4 dermatome) which eases with rest.   Seated straight pallof  press resisting double yellow band 10x10 seconds    Decreased back pain to 7/10   Seated on Air ex pad on chair              bilateral scapular retraction resisting red band 10x5 seconds for 2 sets  Standing mini squats with B UE assist 10x2  Forward step up onto Air Ex pad with B UE assist and CGA 10x2 each LE  Seated manually resisted trunk extension10x5 seconds for 3 sets    Improved exercise technique, movement at target joints, use of target muscles after mod verbal, visual, tactile cues.    Worked on LE strengthening as well today to promote ability to better support herself in standing to help decrease fall risk. Continued workinbg on trunk strengthening and thoracic extension to help decrease pressure to low back.  Decreased back pain to 6/10 at end of session.         PT Short Term Goals - 05/16/18 1224      PT SHORT TERM GOAL #1   Title  Patient will be independent with her HEP to promote trunk and LE strength, and to help decrease back and bilateral LE pain.     Time  3    Period  Weeks    Status  On-going    Target Date  06/09/18        PT Long Term Goals - 05/16/18 1224      PT LONG TERM GOAL #1   Title  Patient will have a decrease in back pain to 5/10 or less at worst to promote ability to ambulate, peform standing tasks more comfortably.     Baseline  10/10 back pain at worst for the past 3 months. (03/24/2018); 9/10 at worst at night, 4-5/10 during the daytime (05/16/2018)    Time  6    Period  Weeks    Status  On-going    Target Date  06/30/18      PT LONG TERM GOAL #2   Title  Patient will improve her lumbar spine FOTO score by at least 10 points as a demonstration of improved function.     Baseline  Lumbar spine FOTO: 37 (03/24/2018); 54 (05/16/2018)    Time  6    Period  Weeks    Status  Achieved    Target Date  06/30/18  PT LONG TERM GOAL #3   Title  Pt will improve bilateral hip abduction and extension strength by at least 1/2 MMT  grade to promote ability to perform standing tasks with less back pain.     Time  6    Period  Weeks    Status  Partially Met    Target Date  06/30/18            Plan - 06/20/18 1532    Clinical Impression Statement  Worked on LE strengthening as well today to promote ability to better support herself in standing to help decrease fall risk. Continued workinbg on trunk strengthening and thoracic extension to help decrease pressure to low back.  Decreased back pain to 6/10 at end of session.     Rehab Potential  Fair    Clinical Impairments Affecting Rehab Potential  (-) Chronicity of back pain, B LE pain, Parkinson's, age; (+) motivated    PT Frequency  2x / week    PT Duration  6 weeks    PT Treatment/Interventions  Therapeutic activities;Therapeutic exercise;Balance training;Neuromuscular re-education;Patient/family education;Manual techniques;Dry needling;Aquatic Therapy;Electrical Stimulation;Iontophoresis 14m/ml Dexamethasone;Gait training    PT Next Visit Plan  trunk, hip strengthening, lumbopelvic control, gait training, manual techniques, modalities PRN    Consulted and Agree with Plan of Care  Patient;Family member/caregiver    Family Member Consulted  husband       Patient will benefit from skilled therapeutic intervention in order to improve the following deficits and impairments:  Pain, Improper body mechanics, Postural dysfunction, Decreased strength, Difficulty walking, Decreased safety awareness, Decreased balance, Abnormal gait  Visit Diagnosis: Chronic bilateral low back pain with bilateral sciatica  Radiculopathy, lumbosacral region  Difficulty in walking, not elsewhere classified  Muscle weakness (generalized)     Problem List Patient Active Problem List   Diagnosis Date Noted  . Numbness 03/09/2018  . Lumbar spondylosis 03/07/2018  . Chronic pain of left knee 03/07/2018  . Chronic upper back pain 12/08/2017  . Benign neoplasm of hand 10/20/2017  .  Chronic pain syndrome 10/20/2017  . Chronic pain of right lower extremity 10/20/2017  . Chronic myofascial pain 10/20/2017  . Numbness and tingling 07/12/2017  . Sleep difficulties 07/12/2017  . Low back pain 01/18/2017  . Sleep behavior disorder, REM 01/18/2017  . Lumbar radiculopathy 11/01/2016  . Follicular lymphoma of extranodal and solid organ sites ([facility] 05/01/2016  . Lymphoma ([place] 04/30/2016  . Hallux limitus 07/27/2015  . Low back pain with sciatica 02/13/2015  . Bilateral low back pain with sciatica 02/13/2015  . H/O adenomatous polyp of colon 04/06/2014  . Hx of adenomatous colonic polyps 04/06/2014  . Back ache 01/25/2014  . Adiposity 01/25/2014  . REM sleep behavior disorder 01/25/2014  . Disordered sleep 01/25/2014  . Has a tremor 01/25/2014  . Obesity, unspecified 01/25/2014  . Sleep disorder 01/25/2014  . Backache 01/25/2014  . Tremor 01/25/2014  . Obesity 01/25/2014  . Difficulty hearing 06/08/2012  . Hearing loss 06/08/2012  . Anxiety 06/06/2012  . Colon polyp 06/06/2012  . Clinical depression 06/06/2012  . Hypercholesteremia 06/06/2012  . Idiopathic Parkinson's disease ([place] 06/06/2012  . Detached retina 06/06/2012  . Depression 06/06/2012  . Retinal detachment 06/06/2012  . [place]'s disease ([place] 06/06/2012    MJoneen BoersPT, DPT   06/20/2018, 8:38 PM  [facility] [address] [place] [address]Phone: [phone]  Fax:  [phone] Name: JAnice Paganini  MRN: [id number] Date of Birth: 1951-01-23

## 2018-06-23 ENCOUNTER — Ambulatory Visit: Payer: Medicare Other

## 2018-06-27 ENCOUNTER — Ambulatory Visit: Payer: Medicare Other

## 2018-06-27 DIAGNOSIS — M5417 Radiculopathy, lumbosacral region: Secondary | ICD-10-CM | POA: Diagnosis not present

## 2018-06-27 DIAGNOSIS — G8929 Other chronic pain: Secondary | ICD-10-CM

## 2018-06-27 DIAGNOSIS — M5441 Lumbago with sciatica, right side: Secondary | ICD-10-CM | POA: Diagnosis not present

## 2018-06-27 DIAGNOSIS — M6281 Muscle weakness (generalized): Secondary | ICD-10-CM

## 2018-06-27 DIAGNOSIS — M5442 Lumbago with sciatica, left side: Principal | ICD-10-CM

## 2018-06-27 DIAGNOSIS — R262 Difficulty in walking, not elsewhere classified: Secondary | ICD-10-CM | POA: Diagnosis not present

## 2018-06-27 NOTE — Therapy (Signed)
[facility] [address] [place], [address] Phone: [phone]   Fax:  [phone]  Physical Therapy Treatment And Progress Report (05/16/2018 to 06/27/2018)  Patient Details  Name: Sarah Donaldson MRN: [id number] Date of Birth: 1950-10-07 Referring Provider (PT): Gurney Maxin, MD   Encounter Date: 06/27/2018  PT End of Session - 06/27/18 1304    Visit Number  19    Number of Visits  41    Date for PT Re-Evaluation  08/11/18    Authorization Type  10    Authorization Time Period  of 10 progress report    PT Start Time  4315    PT Stop Time  1345    PT Time Calculation (min)  40 min    Equipment Utilized During Treatment  Gait belt    Activity Tolerance  Patient limited by pain;Patient tolerated treatment well    Behavior During Therapy  Restless;Impulsive;WFL for tasks assessed/performed       Past Medical History:  Diagnosis Date  . Cancer ([place])    Non-hodgkin's lymphoma (in remission)  . Knee joint cyst, left 02/07/2018  . Parkinson disease ([place])   . Parkinson disease [facility])     Past Surgical History:  Procedure Laterality Date  . BACK SURGERY    . BUNIONECTOMY    . NASAL SEPTUM SURGERY    . RETINAL DETACHMENT SURGERY      There were no vitals filed for this visit.  Subjective Assessment - 06/27/18 1307    Subjective  Pt states 9/10 back pain currently. Might be due to the rain.  Feels like PT is helping her walk. Still has the pain but sometimes it's not as bad as it usually is. Sometimes the pain comes back.  Wants to continue PT if her insurance will cover it.  6/10 back pain at worst for the past 7 days. Pt states that she feels like she can stand a little more straight.     Pertinent History  Chronic back pain. Has had Parkinsons since she was 67 years old. Has had back pain for years. Worsened around 2 years ago and feels cramping as well as pins and needles R medial and lateral  thigh as well as L thigh.  Pt states that [name] increased last week. Difficulty bending her L leg and to kneel due to pain. Wears a knee brace to help with the swelling.   Pt states that when she walks on her L LE and steps on it, it gives way which started around the past 2 days.   Pt states that her back pain has gotten worse since 2 years ago.  Walking makes her legs feel heavy. Pt uses her SPC sporadically when her legs bother her.  Denies loss of bowel or bladder control.  Pt unsure if she has tingling or numness bilateral inner thigh.  Pt also states having Non-Hodgkins lymphoma cancer.   Pt states tinlging and numbness bilateral posterior LE going down to her feet.   Pt also states falling 3 times within the past 6 months.  Pt tripped on a stool, pt also was running and in a hurry, did not watch where she was going and tripped on a basket. Pt also fell off the bed when she was sleeping.      Patient Stated Goals  Be able to stand up straighter.     Currently in Pain?  Yes    Pain  Score  9    Pt does not appear to be in distress   Pain Onset  More than a month ago                               PT Education - 06/27/18 1316    Education Details  ther-ex    [employer]) Educated  Patient    Methods  Explanation;Demonstration;Tactile cues;Verbal cues    Comprehension  Returned demonstration;Verbalized understanding       Objectives     Therapeutic exercise   CGA withgait belt withstanding exercises.   Reviewed plan of care: continue 2x/week for 6 weeks pending insurance coverage based on pt subjective.   Seated clamshell 10x5 seconds resisting blue band for 3 sets    Seated hip adduction ball and glute max squeeze 10x5 seconds   Seated straight pallof press resisting double yellow band 10x10 seconds   Then 10x2 with 5 second holds in standing   Standing mini squats with B UE assist 10x2  Standing hip abduction 10x3 each LE with B UE  assist  Side stepping 5 ft to the R and 5 ft to the L with B UE assist to promote glute med muscle strengthening. 10x   Improved exercise technique, movement at target joints, use of target muscles after mod verbal, visual, tactile cues.    Pt demonstrates overall improved bilateral hip abduction and extension strength, and improved function since initial evaluation. Pain levels at worst vary between 6-9/10 based on subjective reports in which trunk and glute strengthening as well as thoracic extension and improving posture seem to help. Challenges to progress to back pain include chronicity of condition as well as Parkinson's disease. Patient will benefit from continued skilled physical therapy services to decrease back pain as well as to continue improving LE strength and function.       PT Short Term Goals - 05/16/18 1224      PT SHORT TERM GOAL #1   Title  Patient will be independent with her HEP to promote trunk and LE strength, and to help decrease back and bilateral LE pain.     Time  3    Period  Weeks    Status  On-going    Target Date  06/09/18        PT Long Term Goals - 06/27/18 1712      PT LONG TERM GOAL #1   Title  Patient will have a decrease in back pain to 5/10 or less at worst to promote ability to ambulate, peform standing tasks more comfortably.     Baseline  10/10 back pain at worst for the past 3 months. (03/24/2018); 9/10 at worst at night, 4-5/10 during the daytime (05/16/2018); Back pain at worst 6-9/10 for the past 7 days. Pain is not as bad as it usually is sometimes (06/27/2018)      Time  6    Period  Weeks    Status  On-going    Target Date  08/11/18      PT LONG TERM GOAL #2   Title  Patient will improve her lumbar spine FOTO score by at least 10 points as a demonstration of improved function.     Baseline  Lumbar spine FOTO: 37 (03/24/2018); 54 (05/16/2018)    Time  6    Period  Weeks    Status  Achieved    Target Date  06/30/18  PT LONG  TERM GOAL #3   Title  Pt will improve bilateral hip abduction and extension strength by at least 1/2 MMT grade to promote ability to perform standing tasks with less back pain.     Time  6    Period  Weeks    Status  Achieved    Target Date  06/30/18            Plan - 06/27/18 1316    Clinical Impression Statement  Pt demonstrates overall improved bilateral hip abduction and extension strength, and improved function since initial evaluation. Pain levels at worst vary between 6-9/10 based on subjective reports in which trunk and glute strengthening as well as thoracic extension and improving posture seem to help. Challenges to progress to back pain include chronicity of condition as well as Parkinson's disease. Patient will benefit from continued skilled physical therapy services to decrease back pain as well as to continue improving LE strength and function.     History and Personal Factors relevant to plan of care:  Medical history, chronicity of back and LE pain, Parkinson's, L knee pain, weakness, difficulty walking, difficulty moving around, fall history. Husband support at home.     Clinical Presentation  Stable    Clinical Presentation due to:  Pt making progress with PT towards goals.     Clinical Decision Making  Low    Rehab Potential  Fair    Clinical Impairments Affecting Rehab Potential  (-) Chronicity of back pain, B LE pain, Parkinson's, age; (+) motivated    PT Frequency  2x / week    PT Duration  6 weeks    PT Treatment/Interventions  Therapeutic activities;Therapeutic exercise;Balance training;Neuromuscular re-education;Patient/family education;Manual techniques;Dry needling;Aquatic Therapy;Electrical Stimulation;Iontophoresis 4mg /ml Dexamethasone;Gait training    PT Next Visit Plan  trunk, hip strengthening, lumbopelvic control, gait training, manual techniques, modalities PRN    Consulted and Agree with Plan of Care  Patient;Family member/caregiver    Family Member  Consulted  husband       Patient will benefit from skilled therapeutic intervention in order to improve the following deficits and impairments:  Pain, Improper body mechanics, Postural dysfunction, Decreased strength, Difficulty walking, Decreased safety awareness, Decreased balance, Abnormal gait  Visit Diagnosis: Chronic bilateral low back pain with bilateral sciatica - Plan: PT plan of care cert/re-cert  Radiculopathy, lumbosacral region - Plan: PT plan of care cert/re-cert  Difficulty in walking, not elsewhere classified - Plan: PT plan of care cert/re-cert  Muscle weakness (generalized) - Plan: PT plan of care cert/re-cert     Problem List Patient Active Problem List   Diagnosis Date Noted  . Numbness 03/09/2018  . Lumbar spondylosis 03/07/2018  . Chronic pain of left knee 03/07/2018  . Chronic upper back pain 12/08/2017  . Benign neoplasm of hand 10/20/2017  . Chronic pain syndrome 10/20/2017  . Chronic pain of right lower extremity 10/20/2017  . Chronic myofascial pain 10/20/2017  . Numbness and tingling 07/12/2017  . Sleep difficulties 07/12/2017  . Low back pain 01/18/2017  . Sleep behavior disorder, REM 01/18/2017  . Lumbar radiculopathy 11/01/2016  . Follicular lymphoma of extranodal and solid organ sites [facility]) 05/01/2016  . Lymphoma ([place]) 04/30/2016  . Hallux limitus 07/27/2015  . Low back pain with sciatica 02/13/2015  . Bilateral low back pain with sciatica 02/13/2015  . H/O adenomatous polyp of colon 04/06/2014  . Hx of adenomatous colonic polyps 04/06/2014  . Back ache 01/25/2014  . Adiposity 01/25/2014  . REM sleep behavior  disorder 01/25/2014  . Disordered sleep 01/25/2014  . Has a tremor 01/25/2014  . Obesity, unspecified 01/25/2014  . Sleep disorder 01/25/2014  . Backache 01/25/2014  . Tremor 01/25/2014  . Obesity 01/25/2014  . Difficulty hearing 06/08/2012  . Hearing loss 06/08/2012  . Anxiety 06/06/2012  . Colon polyp 06/06/2012  . Clinical  depression 06/06/2012  . Hypercholesteremia 06/06/2012  . Idiopathic Parkinson's disease ([place]) 06/06/2012  . Detached retina 06/06/2012  . Depression 06/06/2012  . Retinal detachment 06/06/2012  . Parkinson's disease ([place]) 06/06/2012   Thank you for your referral.  Joneen Boers PT, DPT   06/27/2018, 5:27 PM  [facility] [address] [place], [address] Phone: [phone]   Fax:  [phone]  Name: MAFALDA MCGINNISS MRN: [id number] Date of Birth: [birth year]

## 2018-06-29 ENCOUNTER — Ambulatory Visit: Payer: Medicare Other

## 2018-06-29 DIAGNOSIS — G8929 Other chronic pain: Secondary | ICD-10-CM | POA: Diagnosis not present

## 2018-06-29 DIAGNOSIS — M5442 Lumbago with sciatica, left side: Secondary | ICD-10-CM

## 2018-06-29 DIAGNOSIS — R262 Difficulty in walking, not elsewhere classified: Secondary | ICD-10-CM

## 2018-06-29 DIAGNOSIS — M5441 Lumbago with sciatica, right side: Secondary | ICD-10-CM | POA: Diagnosis not present

## 2018-06-29 DIAGNOSIS — M5417 Radiculopathy, lumbosacral region: Secondary | ICD-10-CM

## 2018-06-29 DIAGNOSIS — M6281 Muscle weakness (generalized): Secondary | ICD-10-CM

## 2018-06-29 NOTE — Therapy (Signed)
[facility] [address], [place], [address] Phone: [phone]   Fax:  [phone]  Physical Therapy Treatment  Patient Details  Name: Sarah Donaldson MRN: [id number] Date of Birth: 1951/05/22 Referring Provider (PT): Gurney Maxin, MD   Encounter Date: 06/29/2018  PT End of Session - 06/29/18 1301    Visit Number  20    Number of Visits  41    Date for PT Re-Evaluation  08/11/18    Authorization Type  1    Authorization Time Period  of 10 progress report    PT Start Time  1302    PT Stop Time  7322    PT Time Calculation (min)  45 min    Equipment Utilized During Treatment  Gait belt    Activity Tolerance  Patient limited by pain;Patient tolerated treatment well    Behavior During Therapy  Restless;Impulsive;WFL for tasks assessed/performed       Past Medical History:  Diagnosis Date  . Cancer ([place])    Non-hodgkin's lymphoma (in remission)  . Knee joint cyst, left 02/07/2018  . Parkinson disease ([place])   . Parkinson disease [facility])     Past Surgical History:  Procedure Laterality Date  . BACK SURGERY    . BUNIONECTOMY    . NASAL SEPTUM SURGERY    . RETINAL DETACHMENT SURGERY      There were no vitals filed for this visit.  Subjective Assessment - 06/29/18 1304    Subjective  Back is sore. 7/10 currently.     Pertinent History  Chronic back pain. Has had Parkinsons since she was 67 years old. Has had back pain for years. Worsened around 2 years ago and feels cramping as well as pins and needles R medial and lateral thigh as well as L thigh.  Pt states that [name] increased last week. Difficulty bending her L leg and to kneel due to pain. Wears a knee brace to help with the swelling.   Pt states that when she walks on her L LE and steps on it, it gives way which started around the past 2 days.   Pt states that her back pain has gotten worse since 2 years ago.  Walking makes her legs feel  heavy. Pt uses her SPC sporadically when her legs bother her.  Denies loss of bowel or bladder control.  Pt unsure if she has tingling or numness bilateral inner thigh.  Pt also states having Non-Hodgkins lymphoma cancer.   Pt states tinlging and numbness bilateral posterior LE going down to her feet.   Pt also states falling 3 times within the past 6 months.  Pt tripped on a stool, pt also was running and in a hurry, did not watch where she was going and tripped on a basket. Pt also fell off the bed when she was sleeping.      Patient Stated Goals  Be able to stand up straighter.     Currently in Pain?  Yes    Pain Score  7     Pain Onset  More than a month ago                               PT Education - 06/29/18 1308    Education Details  ther-ex    [employer]) Educated  Patient    Methods  Explanation;Demonstration;Tactile cues;Verbal cues  Comprehension  Returned demonstration;Verbalized understanding      Objectives     Therapeutic exercise   CGA withgait belt withstanding exercises.    Seated clamshell 10x5 seconds resisting blue band for 3 sets    Seated hip adduction ball and glute max squeeze 10x5 seconds   Seated straight pallof press resisting double yellow band 10x5 seconds              Then 10x2 with 5 second holds in standing   Standing hip abduction 10x3 each LE with B UE assist  Seated manually resisted trunk extension 10x5 seconds for 3 sets  Standing B shoulder extension resisting yellow band 10x3  Standing hip extension resisting red band around leg 10x2 each LE with B UE assist   Side stepping 5 ft to the R and 5 ft to the L with B UE assist to promote glute med muscle strengthening. 10x  Seated manually resisted R trunk rotation isometrics at neutral to help decrease L trunk rotation 10x5 seconds for 3 sets.   No back pain or soreness when performing the exercise in sitting.      Improved exercise  technique, movement at target joints, use of target muscles after mod verbal, visual, tactile cues.     Continued working on promoting propper posture, trunk and glute strengthening to help decrease stress to low back.      PT Short Term Goals - 05/16/18 1224      PT SHORT TERM GOAL #1   Title  Patient will be independent with her HEP to promote trunk and LE strength, and to help decrease back and bilateral LE pain.     Time  3    Period  Weeks    Status  On-going    Target Date  06/09/18        PT Long Term Goals - 06/27/18 1712      PT LONG TERM GOAL #1   Title  Patient will have a decrease in back pain to 5/10 or less at worst to promote ability to ambulate, peform standing tasks more comfortably.     Baseline  10/10 back pain at worst for the past 3 months. (03/24/2018); 9/10 at worst at night, 4-5/10 during the daytime (05/16/2018); Back pain at worst 6-9/10 for the past 7 days. Pain is not as bad as it usually is sometimes (06/27/2018)      Time  6    Period  Weeks    Status  On-going    Target Date  08/11/18      PT LONG TERM GOAL #2   Title  Patient will improve her lumbar spine FOTO score by at least 10 points as a demonstration of improved function.     Baseline  Lumbar spine FOTO: 37 (03/24/2018); 54 (05/16/2018)    Time  6    Period  Weeks    Status  Achieved    Target Date  06/30/18      PT LONG TERM GOAL #3   Title  Pt will improve bilateral hip abduction and extension strength by at least 1/2 MMT grade to promote ability to perform standing tasks with less back pain.     Time  6    Period  Weeks    Status  Achieved    Target Date  06/30/18            Plan - 06/29/18 1301    Clinical Impression Statement  Continued working on promoting propper  posture, trunk and glute strengthening to help decrease stress to low back.     Rehab Potential  Fair    Clinical Impairments Affecting Rehab Potential  (-) Chronicity of back pain, B LE pain, Parkinson's, age;  (+) motivated    PT Frequency  2x / week    PT Duration  6 weeks    PT Treatment/Interventions  Therapeutic activities;Therapeutic exercise;Balance training;Neuromuscular re-education;Patient/family education;Manual techniques;Dry needling;Aquatic Therapy;Electrical Stimulation;Iontophoresis 4mg /ml Dexamethasone;Gait training    PT Next Visit Plan  trunk, hip strengthening, lumbopelvic control, gait training, manual techniques, modalities PRN    Consulted and Agree with Plan of Care  Patient;Family member/caregiver    Family Member Consulted  husband       Patient will benefit from skilled therapeutic intervention in order to improve the following deficits and impairments:  Pain, Improper body mechanics, Postural dysfunction, Decreased strength, Difficulty walking, Decreased safety awareness, Decreased balance, Abnormal gait  Visit Diagnosis: Radiculopathy, lumbosacral region  Chronic bilateral low back pain with bilateral sciatica  Difficulty in walking, not elsewhere classified  Muscle weakness (generalized)     Problem List Patient Active Problem List   Diagnosis Date Noted  . Numbness 03/09/2018  . Lumbar spondylosis 03/07/2018  . Chronic pain of left knee 03/07/2018  . Chronic upper back pain 12/08/2017  . Benign neoplasm of hand 10/20/2017  . Chronic pain syndrome 10/20/2017  . Chronic pain of right lower extremity 10/20/2017  . Chronic myofascial pain 10/20/2017  . Numbness and tingling 07/12/2017  . Sleep difficulties 07/12/2017  . Low back pain 01/18/2017  . Sleep behavior disorder, REM 01/18/2017  . Lumbar radiculopathy 11/01/2016  . Follicular lymphoma of extranodal and solid organ sites [facility]) 05/01/2016  . Lymphoma ([place]) 04/30/2016  . Hallux limitus 07/27/2015  . Low back pain with sciatica 02/13/2015  . Bilateral low back pain with sciatica 02/13/2015  . H/O adenomatous polyp of colon 04/06/2014  . Hx of adenomatous colonic polyps 04/06/2014  . Back ache  01/25/2014  . Adiposity 01/25/2014  . REM sleep behavior disorder 01/25/2014  . Disordered sleep 01/25/2014  . Has a tremor 01/25/2014  . Obesity, unspecified 01/25/2014  . Sleep disorder 01/25/2014  . Backache 01/25/2014  . Tremor 01/25/2014  . Obesity 01/25/2014  . Difficulty hearing 06/08/2012  . Hearing loss 06/08/2012  . Anxiety 06/06/2012  . Colon polyp 06/06/2012  . Clinical depression 06/06/2012  . Hypercholesteremia 06/06/2012  . Idiopathic Parkinson's disease ([place]) 06/06/2012  . Detached retina 06/06/2012  . Depression 06/06/2012  . Retinal detachment 06/06/2012  . Parkinson's disease ([place]) 06/06/2012    Joneen Boers PT, DPT   06/29/2018, 5:14 PM  [facility] [address], [place], [address] Phone: [phone]   Fax:  [phone]  Name: Sarah Donaldson MRN: [id number] Date of Birth: 02/27/1951

## 2018-07-05 DIAGNOSIS — G20 Parkinson's disease: Secondary | ICD-10-CM | POA: Diagnosis not present

## 2018-07-12 ENCOUNTER — Ambulatory Visit: Payer: Medicare Other | Attending: Nurse Practitioner

## 2018-07-12 DIAGNOSIS — M5417 Radiculopathy, lumbosacral region: Secondary | ICD-10-CM | POA: Diagnosis not present

## 2018-07-12 DIAGNOSIS — R262 Difficulty in walking, not elsewhere classified: Secondary | ICD-10-CM | POA: Diagnosis not present

## 2018-07-12 DIAGNOSIS — M5441 Lumbago with sciatica, right side: Secondary | ICD-10-CM | POA: Diagnosis not present

## 2018-07-12 DIAGNOSIS — M6281 Muscle weakness (generalized): Secondary | ICD-10-CM | POA: Diagnosis not present

## 2018-07-12 DIAGNOSIS — M5442 Lumbago with sciatica, left side: Secondary | ICD-10-CM | POA: Diagnosis not present

## 2018-07-12 DIAGNOSIS — G8929 Other chronic pain: Secondary | ICD-10-CM | POA: Diagnosis not present

## 2018-07-12 NOTE — Therapy (Signed)
[facility] [address], [place], [address] Phone: [phone]   Fax:  [phone]  Physical Therapy Treatment  Patient Details  Name: Sarah Donaldson MRN: [id number] Date of Birth: [birth year] Referring Provider (PT): Gurney Maxin, MD   Encounter Date: 07/12/2018  PT End of Session - 07/12/18 1506    Visit Number  21    Number of Visits  41    Date for PT Re-Evaluation  08/11/18    Authorization Type  2    Authorization Time Period  of 10 progress report    PT Start Time  1507   pt arrived late   PT Stop Time  1532    PT Time Calculation (min)  25 min    Equipment Utilized During Treatment  Gait belt    Activity Tolerance  Patient limited by pain;Patient tolerated treatment well    Behavior During Therapy  Restless;Impulsive;WFL for tasks assessed/performed       Past Medical History:  Diagnosis Date  . Cancer ([place])    Non-hodgkin's lymphoma (in remission)  . Knee joint cyst, left 02/07/2018  . Parkinson disease ([place])   . Parkinson disease [facility])     Past Surgical History:  Procedure Laterality Date  . BACK SURGERY    . BUNIONECTOMY    . NASAL SEPTUM SURGERY    . RETINAL DETACHMENT SURGERY      There were no vitals filed for this visit.  Subjective Assessment - 07/12/18 1508    Subjective  Pt states that she is hurting. Sore low back and B thighs (L5 dermatome). 9/10 currently. Pain increased about 2-3 days ago. Pt had guests over, did Thanksgiving related stuff, baby sat 3 kids and pick up stuff from the floor after them. The bending over gets her.   Dr. Melrose Nakayama gave her new medicine for Parkinson's to help stop the progression. Its the same medicine for men with prostate cancer.     Pertinent History  Chronic back pain. Has had Parkinsons since she was 67 years old. Has had back pain for years. Worsened around 2 years ago and feels cramping as well as pins and needles R medial and lateral  thigh as well as L thigh.  Pt states that [name] increased last week. Difficulty bending her L leg and to kneel due to pain. Wears a knee brace to help with the swelling.   Pt states that when she walks on her L LE and steps on it, it gives way which started around the past 2 days.   Pt states that her back pain has gotten worse since 2 years ago.  Walking makes her legs feel heavy. Pt uses her SPC sporadically when her legs bother her.  Denies loss of bowel or bladder control.  Pt unsure if she has tingling or numness bilateral inner thigh.  Pt also states having Non-Hodgkins lymphoma cancer.   Pt states tinlging and numbness bilateral posterior LE going down to her feet.   Pt also states falling 3 times within the past 6 months.  Pt tripped on a stool, pt also was running and in a hurry, did not watch where she was going and tripped on a basket. Pt also fell off the bed when she was sleeping.      Patient Stated Goals  Be able to stand up straighter.     Currently in Pain?  Yes    Pain Score  9  Pain Onset  More than a month ago                               PT Education - 07/12/18 1516    Education Details  ther-ex    Person(s) Educated  Patient    Methods  Explanation;Demonstration;Tactile cues;Verbal cues    Comprehension  Returned demonstration;Verbalized understanding      Objectives     Therapeutic exercise   CGA withgait belt withstanding exercises.   Seated manually resisted trunk extension isometrics at neutral 10x3 with 5 second holds   Seated manually resisted R trunk rotation isometrics at neutral to help decrease L trunk rotation 10x5 seconds for 3 sets.              Slight decreased low back and LE pain per pt after aforementioned exercise  Standing straight pallof press resisting double yellow band 10x5 seconds  Seated straight pallof press resisting red band 10x5 seconds for 2 sets  Improved exercise technique, movement  at target joints, use of target muscles after mod verbal, visual, tactile cues.   Pt arrived late and treatment adjusted accordingly. Continued to work on trunk strengthening to promote a more neutral posture to help decrease pressure to her low back. Decreased pain after session. Pt will benefit from continued skilled physical therapy services to decrease pain and improve function.       PT Short Term Goals - 05/16/18 1224      PT SHORT TERM GOAL #1   Title  Patient will be independent with her HEP to promote trunk and LE strength, and to help decrease back and bilateral LE pain.     Time  3    Period  Weeks    Status  On-going    Target Date  06/09/18        PT Long Term Goals - 06/27/18 1712      PT LONG TERM GOAL #1   Title  Patient will have a decrease in back pain to 5/10 or less at worst to promote ability to ambulate, peform standing tasks more comfortably.     Baseline  10/10 back pain at worst for the past 3 months. (03/24/2018); 9/10 at worst at night, 4-5/10 during the daytime (05/16/2018); Back pain at worst 6-9/10 for the past 7 days. Pain is not as bad as it usually is sometimes (06/27/2018)      Time  6    Period  Weeks    Status  On-going    Target Date  08/11/18      PT LONG TERM GOAL #2   Title  Patient will improve her lumbar spine FOTO score by at least 10 points as a demonstration of improved function.     Baseline  Lumbar spine FOTO: 37 (03/24/2018); 54 (05/16/2018)    Time  6    Period  Weeks    Status  Achieved    Target Date  06/30/18      PT LONG TERM GOAL #3   Title  Pt will improve bilateral hip abduction and extension strength by at least 1/2 MMT grade to promote ability to perform standing tasks with less back pain.     Time  6    Period  Weeks    Status  Achieved    Target Date  06/30/18            Plan - 07/12/18 1519  Clinical Impression Statement  Pt arrived late and treatment adjusted accordingly. Continued to work on trunk  strengthening to promote a more neutral posture to help decrease pressure to her low back. Decreased pain after session. Pt will benefit from continued skilled physical therapy services to decrease pain and improve function.     Rehab Potential  Fair    Clinical Impairments Affecting Rehab Potential  (-) Chronicity of back pain, B LE pain, Parkinson's, age; (+) motivated    PT Frequency  2x / week    PT Duration  6 weeks    PT Treatment/Interventions  Therapeutic activities;Therapeutic exercise;Balance training;Neuromuscular re-education;Patient/family education;Manual techniques;Dry needling;Aquatic Therapy;Electrical Stimulation;Iontophoresis 4mg /ml Dexamethasone;Gait training    PT Next Visit Plan  trunk, hip strengthening, lumbopelvic control, gait training, manual techniques, modalities PRN    Consulted and Agree with Plan of Care  Patient;Family member/caregiver    Family Member Consulted  husband       Patient will benefit from skilled therapeutic intervention in order to improve the following deficits and impairments:  Pain, Improper body mechanics, Postural dysfunction, Decreased strength, Difficulty walking, Decreased safety awareness, Decreased balance, Abnormal gait  Visit Diagnosis: Radiculopathy, lumbosacral region  Chronic bilateral low back pain with bilateral sciatica  Difficulty in walking, not elsewhere classified  Muscle weakness (generalized)     Problem List Patient Active Problem List   Diagnosis Date Noted  . Numbness 03/09/2018  . Lumbar spondylosis 03/07/2018  . Chronic pain of left knee 03/07/2018  . Chronic upper back pain 12/08/2017  . Benign neoplasm of hand 10/20/2017  . Chronic pain syndrome 10/20/2017  . Chronic pain of right lower extremity 10/20/2017  . Chronic myofascial pain 10/20/2017  . Numbness and tingling 07/12/2017  . Sleep difficulties 07/12/2017  . Low back pain 01/18/2017  . Sleep behavior disorder, REM 01/18/2017  . Lumbar  radiculopathy 11/01/2016  . Follicular lymphoma of extranodal and solid organ sites [facility]) 05/01/2016  . Lymphoma ([place]) 04/30/2016  . Hallux limitus 07/27/2015  . Low back pain with sciatica 02/13/2015  . Bilateral low back pain with sciatica 02/13/2015  . H/O adenomatous polyp of colon 04/06/2014  . Hx of adenomatous colonic polyps 04/06/2014  . Back ache 01/25/2014  . Adiposity 01/25/2014  . REM sleep behavior disorder 01/25/2014  . Disordered sleep 01/25/2014  . Has a tremor 01/25/2014  . Obesity, unspecified 01/25/2014  . Sleep disorder 01/25/2014  . Backache 01/25/2014  . Tremor 01/25/2014  . Obesity 01/25/2014  . Difficulty hearing 06/08/2012  . Hearing loss 06/08/2012  . Anxiety 06/06/2012  . Colon polyp 06/06/2012  . Clinical depression 06/06/2012  . Hypercholesteremia 06/06/2012  . Idiopathic Parkinson's disease ([place]) 06/06/2012  . Detached retina 06/06/2012  . Depression 06/06/2012  . Retinal detachment 06/06/2012  . Parkinson's disease ([place]) 06/06/2012    Joneen Boers PT, DPT   07/12/2018, 6:30 PM  [facility] [address] [place], [address] Phone: [phone]   Fax:  [phone]  Name: SHANIKA LEVINGS MRN: [id number] Date of Birth: 1951-02-26

## 2018-07-14 ENCOUNTER — Ambulatory Visit: Payer: Medicare Other

## 2018-07-14 DIAGNOSIS — M5442 Lumbago with sciatica, left side: Secondary | ICD-10-CM | POA: Diagnosis not present

## 2018-07-14 DIAGNOSIS — M5417 Radiculopathy, lumbosacral region: Secondary | ICD-10-CM | POA: Diagnosis not present

## 2018-07-14 DIAGNOSIS — R262 Difficulty in walking, not elsewhere classified: Secondary | ICD-10-CM | POA: Diagnosis not present

## 2018-07-14 DIAGNOSIS — L570 Actinic keratosis: Secondary | ICD-10-CM | POA: Diagnosis not present

## 2018-07-14 DIAGNOSIS — M6281 Muscle weakness (generalized): Secondary | ICD-10-CM

## 2018-07-14 DIAGNOSIS — G8929 Other chronic pain: Secondary | ICD-10-CM

## 2018-07-14 DIAGNOSIS — L565 Disseminated superficial actinic porokeratosis (DSAP): Secondary | ICD-10-CM | POA: Diagnosis not present

## 2018-07-14 DIAGNOSIS — L578 Other skin changes due to chronic exposure to nonionizing radiation: Secondary | ICD-10-CM | POA: Diagnosis not present

## 2018-07-14 DIAGNOSIS — M5441 Lumbago with sciatica, right side: Secondary | ICD-10-CM | POA: Diagnosis not present

## 2018-07-14 NOTE — Therapy (Signed)
[facility] [address] [place], [address] Phone: [phone]   Fax:  [phone]  Physical Therapy Treatment  Patient Details  Name: Sarah Donaldson MRN: [id number] Date of Birth: [birth year] Referring Provider (PT): Gurney Maxin, MD   Encounter Date: 07/14/2018  PT End of Session - 07/14/18 1439    Visit Number  22    Number of Visits  41    Date for PT Re-Evaluation  08/11/18    Authorization Type  3    Authorization Time Period  of 10 progress report    PT Start Time  1751   pt arrived late   PT Stop Time  1520    PT Time Calculation (min)  41 min    Equipment Utilized During Treatment  Gait belt    Activity Tolerance  Patient limited by pain;Patient tolerated treatment well    Behavior During Therapy  Restless;Impulsive;WFL for tasks assessed/performed       Past Medical History:  Diagnosis Date  . Cancer ([place])    Non-hodgkin's lymphoma (in remission)  . Knee joint cyst, left 02/07/2018  . Parkinson disease ([place])   . Parkinson disease [facility])     Past Surgical History:  Procedure Laterality Date  . BACK SURGERY    . BUNIONECTOMY    . NASAL SEPTUM SURGERY    . RETINAL DETACHMENT SURGERY      There were no vitals filed for this visit.  Subjective Assessment - 07/14/18 1440    Subjective  Pt states difficulty walking on her L LE due to L lateral knee and leg pain, 10/10 currently. 4/10 back pain currently, just took Parkinsons' medicine. Also started a new medicine for Parkinsons in which side effects include dizziness and fainting.  L lateral knee pain began this morning. Pt was sewing and did a lot of bending over from standing to pick up material from the floor.     Pertinent History  Chronic back pain. Has had Parkinsons since she was 67 years old. Has had back pain for years. Worsened around 2 years ago and feels cramping as well as pins and needles R medial and lateral thigh as well as  L thigh.  Pt states that [name] increased last week. Difficulty bending her L leg and to kneel due to pain. Wears a knee brace to help with the swelling.   Pt states that when she walks on her L LE and steps on it, it gives way which started around the past 2 days.   Pt states that her back pain has gotten worse since 2 years ago.  Walking makes her legs feel heavy. Pt uses her SPC sporadically when her legs bother her.  Denies loss of bowel or bladder control.  Pt unsure if she has tingling or numness bilateral inner thigh.  Pt also states having Non-Hodgkins lymphoma cancer.   Pt states tinlging and numbness bilateral posterior LE going down to her feet.   Pt also states falling 3 times within the past 6 months.  Pt tripped on a stool, pt also was running and in a hurry, did not watch where she was going and tripped on a basket. Pt also fell off the bed when she was sleeping.      Patient Stated Goals  Be able to stand up straighter.     Currently in Pain?  Yes    Pain Score  --   4/10 back, 10/10  L leg pain   Pain Onset  More than a month ago                               PT Education - 07/14/18 1449    Education Details  ther-ex, HEP    Person(s) Educated  Patient    Methods  Explanation;Demonstration;Tactile cues;Verbal cues;Handout    Comprehension  Returned demonstration;Verbalized understanding      Objectives    L leg swelling. (-) L calf squeeze. L tibialis anterior muscle tension    Therapeutic exercise   CGA withgait belt withstanding exercises.   Seated hip adduction ball and glute max squeeze 10x5 seconds for 3 sets  Seated B ankle DF/PF 10x2 Seated B ankle PF 10x2  Seated L hip extension isometrics 10x5 seconds for 3 sets  No L lateral leg ache afterwards in sitting   No L lateral knee and leg pain walking afterwards. Still has back pain.   Seated manually resisted trunk extension isometrics at neutral 10x3 with 5 second  holds   Decreased back pain in standing  Seated manually resisted trunk flexion isometrics 10x3 with 5 second holds   Seated clamshell isometrics, hips less than 90 degrees flexion (chair with Air Ex pad) 10x5 seconds for 3 sets. Increased L lateral leg discomfort, eases with seated hip extension isometric exercise.      Improved exercise technique, movement at target joints, use of target muscles after mod verbal, visual, tactile cues.    Worked on decreasing L knee and lateral leg pain to improve ability to ambulate and decrease fall risk. Decreased L knee and lateral leg pain in standing after seated L hip extension exercises. Decreased back pain with trunk muscle and glute strengthening. Pt will benefit from continued skilled physical therapy services to decrease pain and improve function. Challenges to progress include Parkinson's and difficulty controlling movement at times as well as poor posture (habit).              PT Short Term Goals - 05/16/18 1224      PT SHORT TERM GOAL #1   Title  Patient will be independent with her HEP to promote trunk and LE strength, and to help decrease back and bilateral LE pain.     Time  3    Period  Weeks    Status  On-going    Target Date  06/09/18        PT Long Term Goals - 06/27/18 1712      PT LONG TERM GOAL #1   Title  Patient will have a decrease in back pain to 5/10 or less at worst to promote ability to ambulate, peform standing tasks more comfortably.     Baseline  10/10 back pain at worst for the past 3 months. (03/24/2018); 9/10 at worst at night, 4-5/10 during the daytime (05/16/2018); Back pain at worst 6-9/10 for the past 7 days. Pain is not as bad as it usually is sometimes (06/27/2018)      Time  6    Period  Weeks    Status  On-going    Target Date  08/11/18      PT LONG TERM GOAL #2   Title  Patient will improve her lumbar spine FOTO score by at least 10 points as a demonstration of improved function.      Baseline  Lumbar spine FOTO: 37 (03/24/2018); 54 (05/16/2018)    Time  6    Period  Weeks    Status  Achieved    Target Date  06/30/18      PT LONG TERM GOAL #3   Title  Pt will improve bilateral hip abduction and extension strength by at least 1/2 MMT grade to promote ability to perform standing tasks with less back pain.     Time  6    Period  Weeks    Status  Achieved    Target Date  06/30/18            Plan - 07/14/18 1449    Clinical Impression Statement  Worked on decreasing L knee and lateral leg pain to improve ability to ambulate and decrease fall risk. Decreased L knee and lateral leg pain in standing after seated L hip extension exercises. Decreased back pain with trunk muscle and glute strengthening. Pt will benefit from continued skilled physical therapy services to decrease pain and improve function. Challenges to progress include Parkinson's and difficulty controlling movement at times as well as poor posture (habit).     Rehab Potential  Fair    Clinical Impairments Affecting Rehab Potential  (-) Chronicity of back pain, B LE pain, Parkinson's, age; (+) motivated    PT Frequency  2x / week    PT Duration  6 weeks    PT Treatment/Interventions  Therapeutic activities;Therapeutic exercise;Balance training;Neuromuscular re-education;Patient/family education;Manual techniques;Dry needling;Aquatic Therapy;Electrical Stimulation;Iontophoresis 4mg /ml Dexamethasone;Gait training    PT Next Visit Plan  trunk, hip strengthening, lumbopelvic control, gait training, manual techniques, modalities PRN    Consulted and Agree with Plan of Care  Patient;Family member/caregiver    Family Member Consulted  husband       Patient will benefit from skilled therapeutic intervention in order to improve the following deficits and impairments:  Pain, Improper body mechanics, Postural dysfunction, Decreased strength, Difficulty walking, Decreased safety awareness, Decreased balance, Abnormal  gait  Visit Diagnosis: Radiculopathy, lumbosacral region  Chronic bilateral low back pain with bilateral sciatica  Difficulty in walking, not elsewhere classified  Muscle weakness (generalized)     Problem List Patient Active Problem List   Diagnosis Date Noted  . Numbness 03/09/2018  . Lumbar spondylosis 03/07/2018  . Chronic pain of left knee 03/07/2018  . Chronic upper back pain 12/08/2017  . Benign neoplasm of hand 10/20/2017  . Chronic pain syndrome 10/20/2017  . Chronic pain of right lower extremity 10/20/2017  . Chronic myofascial pain 10/20/2017  . Numbness and tingling 07/12/2017  . Sleep difficulties 07/12/2017  . Low back pain 01/18/2017  . Sleep behavior disorder, REM 01/18/2017  . Lumbar radiculopathy 11/01/2016  . Follicular lymphoma of extranodal and solid organ sites [facility]) 05/01/2016  . Lymphoma ([place]) 04/30/2016  . Hallux limitus 07/27/2015  . Low back pain with sciatica 02/13/2015  . Bilateral low back pain with sciatica 02/13/2015  . H/O adenomatous polyp of colon 04/06/2014  . Hx of adenomatous colonic polyps 04/06/2014  . Back ache 01/25/2014  . Adiposity 01/25/2014  . REM sleep behavior disorder 01/25/2014  . Disordered sleep 01/25/2014  . Has a tremor 01/25/2014  . Obesity, unspecified 01/25/2014  . Sleep disorder 01/25/2014  . Backache 01/25/2014  . Tremor 01/25/2014  . Obesity 01/25/2014  . Difficulty hearing 06/08/2012  . Hearing loss 06/08/2012  . Anxiety 06/06/2012  . Colon polyp 06/06/2012  . Clinical depression 06/06/2012  . Hypercholesteremia 06/06/2012  . Idiopathic Parkinson's disease ([place]) 06/06/2012  . Detached retina 06/06/2012  . Depression 06/06/2012  .  Retinal detachment 06/06/2012  . Parkinson's disease ([place]) 06/06/2012    Joneen Boers PT, DPT   07/14/2018, 3:33 PM  [facility] [address], [place], [address] Phone: [phone]   Fax:   [phone]  Name: Sarah Donaldson MRN: [id number] Date of Birth: Apr 01, 1951

## 2018-07-14 NOTE — Patient Instructions (Signed)
Seated hip extension isometrics   Sitting on a chair against the wall.    Squeeze your rear end muscles together and press your L foot onto the floor.     Hold for 5 seconds    Repeat 10 times   Perform 2 sets daily.      This is a corrective exercise. Once you no longer have symptoms, you can stop.

## 2018-07-19 DIAGNOSIS — M545 Low back pain: Secondary | ICD-10-CM | POA: Diagnosis not present

## 2018-07-19 DIAGNOSIS — M1712 Unilateral primary osteoarthritis, left knee: Secondary | ICD-10-CM | POA: Diagnosis not present

## 2018-07-20 ENCOUNTER — Ambulatory Visit: Payer: Medicare Other | Admitting: Physical Therapy

## 2018-07-20 DIAGNOSIS — M5441 Lumbago with sciatica, right side: Secondary | ICD-10-CM

## 2018-07-20 DIAGNOSIS — G8929 Other chronic pain: Secondary | ICD-10-CM

## 2018-07-20 DIAGNOSIS — R262 Difficulty in walking, not elsewhere classified: Secondary | ICD-10-CM | POA: Diagnosis not present

## 2018-07-20 DIAGNOSIS — M6281 Muscle weakness (generalized): Secondary | ICD-10-CM

## 2018-07-20 DIAGNOSIS — M5442 Lumbago with sciatica, left side: Secondary | ICD-10-CM

## 2018-07-20 DIAGNOSIS — M5417 Radiculopathy, lumbosacral region: Secondary | ICD-10-CM

## 2018-07-21 ENCOUNTER — Encounter: Payer: Self-pay | Admitting: Physical Therapy

## 2018-07-21 NOTE — Therapy (Signed)
[facility] [address] [place], [address] Phone: [phone]   Fax:  [phone]  Physical Therapy Treatment  Patient Details  Name: Sarah Donaldson MRN: [id number] Date of Birth: 1950/12/20 Referring Provider (PT): Gurney Maxin, MD   Encounter Date: 07/20/2018  PT End of Session - 07/20/18 1608    Visit Number  23    Number of Visits  41    Date for PT Re-Evaluation  08/11/18    Authorization Type  4    Authorization Time Period  of 10 progress report    PT Start Time  1600    PT Stop Time  1640    PT Time Calculation (min)  40 min    Equipment Utilized During Treatment  Gait belt    Activity Tolerance  Patient limited by pain;Patient tolerated treatment well    Behavior During Therapy  Restless;Impulsive;WFL for tasks assessed/performed       Past Medical History:  Diagnosis Date  . Cancer ([place])    Non-hodgkin's lymphoma (in remission)  . Knee joint cyst, left 02/07/2018  . Parkinson disease ([place])   . Parkinson disease [facility])     Past Surgical History:  Procedure Laterality Date  . BACK SURGERY    . BUNIONECTOMY    . NASAL SEPTUM SURGERY    . RETINAL DETACHMENT SURGERY      There were no vitals filed for this visit.  Subjective Assessment - 07/20/18 1605    Subjective  Patient states that she is feeling a little dizzy today, but denies any falls. Patient also went to the orthopedist yesterday for a L knee injection, and is feeling sore. Patient also reports increased back pain.     Pertinent History  Chronic back pain. Has had Parkinsons since she was 67 years old. Has had back pain for years. Worsened around 2 years ago and feels cramping as well as pins and needles R medial and lateral thigh as well as L thigh.  Pt states that [name] increased last week. Difficulty bending her L leg and to kneel due to pain. Wears a knee brace to help with the swelling.   Pt states that when  she walks on her L LE and steps on it, it gives way which started around the past 2 days.   Pt states that her back pain has gotten worse since 2 years ago.  Walking makes her legs feel heavy. Pt uses her SPC sporadically when her legs bother her.  Denies loss of bowel or bladder control.  Pt unsure if she has tingling or numness bilateral inner thigh.  Pt also states having Non-Hodgkins lymphoma cancer.   Pt states tinlging and numbness bilateral posterior LE going down to her feet.   Pt also states falling 3 times within the past 6 months.  Pt tripped on a stool, pt also was running and in a hurry, did not watch where she was going and tripped on a basket. Pt also fell off the bed when she was sleeping.      Patient Stated Goals  Be able to stand up straighter.     Currently in Pain?  Yes    Pain Score  7     Pain Location  Back    Pain Orientation  Lower    Pain Descriptors / Indicators  Sore    Pain Type  Chronic pain    Pain Onset  More than a  month ago    Multiple Pain Sites  Yes    Pain Score  8    Pain Location  Knee    Pain Orientation  Left    Pain Descriptors / Indicators  Sharp;Jabbing    Pain Onset  More than a month ago       Therapeutic exercise      CGA with gait belt with standing exercises.    Seated hip adduction ball and glute max squeeze 10x5 seconds for 3 sets   Seated B ankle DF/PF 10x2 Seated B ankle PF 10x2   Seated L hip extension isometrics 10x5 seconds for 3 sets             No L lateral leg ache afterwards in sitting              No L lateral knee and leg pain walking afterwards. Still has back pain.    Seated trunk extension w/ 1.2 kg ball lifts to chest height x20  Seated woodchop w/ 1.2 kg ball x15 B  Seated thoracic rotation w/ 1.2 kg ball x10 B   Seated manually resisted trunk flexion isometrics 10x3 with 5 second holds   Seated clamshell, yellow band 3x10x3 sec hold  Gait x 100 feet, CGA, VCs for large amplitude movement to optimize fluidity  of gait pattern    PT Education - 07/21/18 1010    Education Details  exercise technique, large amplitude movements    Person(s) Educated  Patient    Methods  Explanation;Demonstration;Tactile cues;Verbal cues    Comprehension  Returned demonstration;Verbalized understanding;Need further instruction       PT Short Term Goals - 05/16/18 1224      PT SHORT TERM GOAL #1   Title  Patient will be independent with her HEP to promote trunk and LE strength, and to help decrease back and bilateral LE pain.     Time  3    Period  Weeks    Status  On-going    Target Date  06/09/18        PT Long Term Goals - 06/27/18 1712      PT LONG TERM GOAL #1   Title  Patient will have a decrease in back pain to 5/10 or less at worst to promote ability to ambulate, peform standing tasks more comfortably.     Baseline  10/10 back pain at worst for the past 3 months. (03/24/2018); 9/10 at worst at night, 4-5/10 during the daytime (05/16/2018); Back pain at worst 6-9/10 for the past 7 days. Pain is not as bad as it usually is sometimes (06/27/2018)      Time  6    Period  Weeks    Status  On-going    Target Date  08/11/18      PT LONG TERM GOAL #2   Title  Patient will improve her lumbar spine FOTO score by at least 10 points as a demonstration of improved function.     Baseline  Lumbar spine FOTO: 37 (03/24/2018); 54 (05/16/2018)    Time  6    Period  Weeks    Status  Achieved    Target Date  06/30/18      PT LONG TERM GOAL #3   Title  Pt will improve bilateral hip abduction and extension strength by at least 1/2 MMT grade to promote ability to perform standing tasks with less back pain.     Time  6    Period  Weeks    Status  Achieved    Target Date  06/30/18            Plan - 07/21/18 1011    Clinical Impression Statement  Patient presents to clinic with increased dizziness, but was amenable to therapy. Patient performed seated strengthening of the trunk and BLE with moderate VCs for  pace of activity and decreased compensatory patterns. Patient ambulated with rollator for 100 feet with emphasis on large amplitude fluid movement with moderate success without dual task; however when engaged in dual task, movement stride length and foot clearance are both negativey impacted. Patient will continue to benefit from skilled therapeutic intervention in order to address deficits in pain and function thu improving safety and overall QOL.    Rehab Potential  Fair    Clinical Impairments Affecting Rehab Potential  (-) Chronicity of back pain, B LE pain, Parkinson's, age; (+) motivated    PT Frequency  2x / week    PT Duration  6 weeks    PT Treatment/Interventions  Therapeutic activities;Therapeutic exercise;Balance training;Neuromuscular re-education;Patient/family education;Manual techniques;Dry needling;Aquatic Therapy;Electrical Stimulation;Iontophoresis 4mg /ml Dexamethasone;Gait training    PT Next Visit Plan  trunk, hip strengthening, lumbopelvic control, gait training, manual techniques, modalities PRN    Consulted and Agree with Plan of Care  Patient;Family member/caregiver    Family Member Consulted  husband       Patient will benefit from skilled therapeutic intervention in order to improve the following deficits and impairments:  Pain, Improper body mechanics, Postural dysfunction, Decreased strength, Difficulty walking, Decreased safety awareness, Decreased balance, Abnormal gait  Visit Diagnosis: Radiculopathy, lumbosacral region  Chronic bilateral low back pain with bilateral sciatica  Difficulty in walking, not elsewhere classified  Muscle weakness (generalized)     Problem List Patient Active Problem List   Diagnosis Date Noted  . Numbness 03/09/2018  . Lumbar spondylosis 03/07/2018  . Chronic pain of left knee 03/07/2018  . Chronic upper back pain 12/08/2017  . Benign neoplasm of hand 10/20/2017  . Chronic pain syndrome 10/20/2017  . Chronic pain of right  lower extremity 10/20/2017  . Chronic myofascial pain 10/20/2017  . Numbness and tingling 07/12/2017  . Sleep difficulties 07/12/2017  . Low back pain 01/18/2017  . Sleep behavior disorder, REM 01/18/2017  . Lumbar radiculopathy 11/01/2016  . Follicular lymphoma of extranodal and solid organ sites [facility]) 05/01/2016  . Lymphoma ([place]) 04/30/2016  . Hallux limitus 07/27/2015  . Low back pain with sciatica 02/13/2015  . Bilateral low back pain with sciatica 02/13/2015  . H/O adenomatous polyp of colon 04/06/2014  . Hx of adenomatous colonic polyps 04/06/2014  . Back ache 01/25/2014  . Adiposity 01/25/2014  . REM sleep behavior disorder 01/25/2014  . Disordered sleep 01/25/2014  . Has a tremor 01/25/2014  . Obesity, unspecified 01/25/2014  . Sleep disorder 01/25/2014  . Backache 01/25/2014  . Tremor 01/25/2014  . Obesity 01/25/2014  . Difficulty hearing 06/08/2012  . Hearing loss 06/08/2012  . Anxiety 06/06/2012  . Colon polyp 06/06/2012  . Clinical depression 06/06/2012  . Hypercholesteremia 06/06/2012  . Idiopathic Parkinson's disease ([place]) 06/06/2012  . Detached retina 06/06/2012  . Depression 06/06/2012  . Retinal detachment 06/06/2012  . Parkinson's disease ([place]) 06/06/2012    Myles Gip PT, DPT [id number] 07/21/2018, 10:15 AM   [facility] [address], [place], [address] Phone: [phone]   Fax:  [phone]  Name: Sarah Donaldson MRN: [id number] Date  of Birth: 02-20-1951

## 2018-07-26 ENCOUNTER — Ambulatory Visit: Payer: Medicare Other

## 2018-07-26 DIAGNOSIS — M5441 Lumbago with sciatica, right side: Secondary | ICD-10-CM

## 2018-07-26 DIAGNOSIS — M5417 Radiculopathy, lumbosacral region: Secondary | ICD-10-CM | POA: Diagnosis not present

## 2018-07-26 DIAGNOSIS — R262 Difficulty in walking, not elsewhere classified: Secondary | ICD-10-CM | POA: Diagnosis not present

## 2018-07-26 DIAGNOSIS — M5442 Lumbago with sciatica, left side: Secondary | ICD-10-CM

## 2018-07-26 DIAGNOSIS — M6281 Muscle weakness (generalized): Secondary | ICD-10-CM

## 2018-07-26 DIAGNOSIS — G8929 Other chronic pain: Secondary | ICD-10-CM

## 2018-07-26 NOTE — Therapy (Signed)
[facility] [address] [place], [address] Phone: [phone]   Fax:  [phone]  Physical Therapy Treatment  Patient Details  Name: Sarah Donaldson MRN: [id number] Date of Birth: 12-15-1950 Referring Provider (PT): Gurney Maxin, MD   Encounter Date: 07/26/2018  PT End of Session - 07/26/18 1504    Visit Number  24    Number of Visits  41    Date for PT Re-Evaluation  08/11/18    Authorization Type  5    Authorization Time Period  of 10 progress report    PT Start Time  1504    PT Stop Time  7824    PT Time Calculation (min)  40 min    Equipment Utilized During Treatment  Gait belt    Activity Tolerance  Patient limited by pain;Patient tolerated treatment well    Behavior During Therapy  Restless;Impulsive;WFL for tasks assessed/performed       Past Medical History:  Diagnosis Date  . Cancer ([place])    Non-hodgkin's lymphoma (in remission)  . Knee joint cyst, left 02/07/2018  . Parkinson disease ([place])   . Parkinson disease [facility])     Past Surgical History:  Procedure Laterality Date  . BACK SURGERY    . BUNIONECTOMY    . NASAL SEPTUM SURGERY    . RETINAL DETACHMENT SURGERY      There were no vitals filed for this visit.  Subjective Assessment - 07/26/18 1505    Subjective  Pt states that she is hurting which started a little while ago. Also feels tremmor in both legs.  Took her Parkinson's medicine about 30 minutes ago. Usually has tremmors before the medicine. Has been putting heat on her back which helped with pain. Was not able to put heat on her back. Feels like a 10/10 back pain currently.  Pain started around 2 pm. Was fine before that. Was fine in her back until she took her Parkonson's medicine.     Pertinent History  Chronic back pain. Has had Parkinsons since she was 67 years old. Has had back pain for years. Worsened around 2 years ago and feels cramping as well as pins and needles  R medial and lateral thigh as well as L thigh.  Pt states that [name] increased last week. Difficulty bending her L leg and to kneel due to pain. Wears a knee brace to help with the swelling.   Pt states that when she walks on her L LE and steps on it, it gives way which started around the past 2 days.   Pt states that her back pain has gotten worse since 2 years ago.  Walking makes her legs feel heavy. Pt uses her SPC sporadically when her legs bother her.  Denies loss of bowel or bladder control.  Pt unsure if she has tingling or numness bilateral inner thigh.  Pt also states having Non-Hodgkins lymphoma cancer.   Pt states tinlging and numbness bilateral posterior LE going down to her feet.   Pt also states falling 3 times within the past 6 months.  Pt tripped on a stool, pt also was running and in a hurry, did not watch where she was going and tripped on a basket. Pt also fell off the bed when she was sleeping.      Patient Stated Goals  Be able to stand up straighter.     Currently in Pain?  Yes  Pain Score  10-Worst pain ever    Pain Onset  More than a month ago    Pain Onset  More than a month ago                               PT Education - 07/26/18 1511    Education Details  ther-ex    Person(s) Educated  Patient    Methods  Explanation;Demonstration;Tactile cues;Verbal cues    Comprehension  Returned demonstration;Verbalized understanding          Objectives    Therapeutic exercise   CGA withgait belt withstanding exercises.   Seated manually resisted trunk extension isometrics at neutral 10x3 with 5 second holds             Decreased LE tremors, improve LE sensation   Seated manually resisted trunk flexion isometrics 10x3 with 5 second holds  Standing B scapular retraction red band 10x5 seconds for 3 sets to promote thoracic extension  Standing straight pallof press red band 10x5 seconds for 3 sets   Standing hip abduction  with B UE assist 2 lbs  R 10x3  L 10x3  Side stepping with B UE assist at treadmill bar 10x each direction  Forward step up onto Air Ex pad with B UE assist  R LE 10x3  L LE 10x3  Standing static mini squat with B UE assist 10x5 seconds for 2 sets  Seated B ankle DF/PF 10x2  Improved exercise technique, movement at target joints, use of target muscles after mod verbal, visual, tactile cues.    Decreased back pain with trunk and glute exercises. Better able to maintain upright posture with decreased L lateral lean observed today. Patient will benefit from continued skilled physical therapy services to decrease pain, improve strength and function.        PT Short Term Goals - 05/16/18 1224      PT SHORT TERM GOAL #1   Title  Patient will be independent with her HEP to promote trunk and LE strength, and to help decrease back and bilateral LE pain.     Time  3    Period  Weeks    Status  On-going    Target Date  06/09/18        PT Long Term Goals - 06/27/18 1712      PT LONG TERM GOAL #1   Title  Patient will have a decrease in back pain to 5/10 or less at worst to promote ability to ambulate, peform standing tasks more comfortably.     Baseline  10/10 back pain at worst for the past 3 months. (03/24/2018); 9/10 at worst at night, 4-5/10 during the daytime (05/16/2018); Back pain at worst 6-9/10 for the past 7 days. Pain is not as bad as it usually is sometimes (06/27/2018)      Time  6    Period  Weeks    Status  On-going    Target Date  08/11/18      PT LONG TERM GOAL #2   Title  Patient will improve her lumbar spine FOTO score by at least 10 points as a demonstration of improved function.     Baseline  Lumbar spine FOTO: 37 (03/24/2018); 54 (05/16/2018)    Time  6    Period  Weeks    Status  Achieved    Target Date  06/30/18      PT LONG TERM  GOAL #3   Title  Pt will improve bilateral hip abduction and extension strength by at least 1/2 MMT grade to promote ability  to perform standing tasks with less back pain.     Time  6    Period  Weeks    Status  Achieved    Target Date  06/30/18            Plan - 07/26/18 1545    Clinical Impression Statement  Decreased back pain with trunk and glute exercises. Better able to maintain upright posture with decreased L lateral lean observed today. Patient will benefit from continued skilled physical therapy services to decrease pain, improve strength and function.     Rehab Potential  Fair    Clinical Impairments Affecting Rehab Potential  (-) Chronicity of back pain, B LE pain, Parkinson's, age; (+) motivated    PT Frequency  2x / week    PT Duration  6 weeks    PT Treatment/Interventions  Therapeutic activities;Therapeutic exercise;Balance training;Neuromuscular re-education;Patient/family education;Manual techniques;Dry needling;Aquatic Therapy;Electrical Stimulation;Iontophoresis 4mg /ml Dexamethasone;Gait training    PT Next Visit Plan  trunk, hip strengthening, lumbopelvic control, gait training, manual techniques, modalities PRN    Consulted and Agree with Plan of Care  Patient;Family member/caregiver    Family Member Consulted  husband       Patient will benefit from skilled therapeutic intervention in order to improve the following deficits and impairments:  Pain, Improper body mechanics, Postural dysfunction, Decreased strength, Difficulty walking, Decreased safety awareness, Decreased balance, Abnormal gait  Visit Diagnosis: Radiculopathy, lumbosacral region  Chronic bilateral low back pain with bilateral sciatica  Difficulty in walking, not elsewhere classified  Muscle weakness (generalized)     Problem List Patient Active Problem List   Diagnosis Date Noted  . Numbness 03/09/2018  . Lumbar spondylosis 03/07/2018  . Chronic pain of left knee 03/07/2018  . Chronic upper back pain 12/08/2017  . Benign neoplasm of hand 10/20/2017  . Chronic pain syndrome 10/20/2017  . Chronic pain of  right lower extremity 10/20/2017  . Chronic myofascial pain 10/20/2017  . Numbness and tingling 07/12/2017  . Sleep difficulties 07/12/2017  . Low back pain 01/18/2017  . Sleep behavior disorder, REM 01/18/2017  . Lumbar radiculopathy 11/01/2016  . Follicular lymphoma of extranodal and solid organ sites [facility]) 05/01/2016  . Lymphoma ([place]) 04/30/2016  . Hallux limitus 07/27/2015  . Low back pain with sciatica 02/13/2015  . Bilateral low back pain with sciatica 02/13/2015  . H/O adenomatous polyp of colon 04/06/2014  . Hx of adenomatous colonic polyps 04/06/2014  . Back ache 01/25/2014  . Adiposity 01/25/2014  . REM sleep behavior disorder 01/25/2014  . Disordered sleep 01/25/2014  . Has a tremor 01/25/2014  . Obesity, unspecified 01/25/2014  . Sleep disorder 01/25/2014  . Backache 01/25/2014  . Tremor 01/25/2014  . Obesity 01/25/2014  . Difficulty hearing 06/08/2012  . Hearing loss 06/08/2012  . Anxiety 06/06/2012  . Colon polyp 06/06/2012  . Clinical depression 06/06/2012  . Hypercholesteremia 06/06/2012  . Idiopathic Parkinson's disease ([place]) 06/06/2012  . Detached retina 06/06/2012  . Depression 06/06/2012  . Retinal detachment 06/06/2012  . Parkinson's disease ([place]) 06/06/2012    Joneen Boers PT, DPT   07/26/2018, 4:50 PM  [facility] [address], [place], [address] Phone: [phone]   Fax:  [phone]  Name: CELESE BANNER MRN: [id number] Date of Birth: [birth year]

## 2018-07-27 ENCOUNTER — Other Ambulatory Visit: Payer: Self-pay | Admitting: Unknown Physician Specialty

## 2018-07-27 DIAGNOSIS — R1310 Dysphagia, unspecified: Secondary | ICD-10-CM | POA: Diagnosis not present

## 2018-07-27 DIAGNOSIS — E041 Nontoxic single thyroid nodule: Secondary | ICD-10-CM | POA: Diagnosis not present

## 2018-07-27 DIAGNOSIS — Z8572 Personal history of non-Hodgkin lymphomas: Secondary | ICD-10-CM | POA: Diagnosis not present

## 2018-07-27 DIAGNOSIS — M26609 Unspecified temporomandibular joint disorder, unspecified side: Secondary | ICD-10-CM | POA: Diagnosis not present

## 2018-08-02 ENCOUNTER — Ambulatory Visit: Payer: Medicare Other

## 2018-08-02 DIAGNOSIS — M6281 Muscle weakness (generalized): Secondary | ICD-10-CM

## 2018-08-02 DIAGNOSIS — M5441 Lumbago with sciatica, right side: Secondary | ICD-10-CM | POA: Diagnosis not present

## 2018-08-02 DIAGNOSIS — R262 Difficulty in walking, not elsewhere classified: Secondary | ICD-10-CM | POA: Diagnosis not present

## 2018-08-02 DIAGNOSIS — G8929 Other chronic pain: Secondary | ICD-10-CM | POA: Diagnosis not present

## 2018-08-02 DIAGNOSIS — M5417 Radiculopathy, lumbosacral region: Secondary | ICD-10-CM

## 2018-08-02 DIAGNOSIS — M5442 Lumbago with sciatica, left side: Secondary | ICD-10-CM

## 2018-08-02 NOTE — Therapy (Signed)
[facility] [address] [place], [address] Phone: [phone]   Fax:  [phone]  Physical Therapy Treatment  Patient Details  Name: Sarah Donaldson MRN: [id number] Date of Birth: 1950/11/03 Referring Provider (PT): Gurney Maxin, MD   Encounter Date: 08/02/2018  PT End of Session - 08/02/18 0959    Visit Number  25    Number of Visits  41    Date for PT Re-Evaluation  08/11/18    Authorization Type  6    Authorization Time Period  of 10 progress report    PT Start Time  1000   pt arrived late   PT Stop Time  1030    PT Time Calculation (min)  30 min    Equipment Utilized During Treatment  Gait belt    Activity Tolerance  Patient limited by pain;Patient tolerated treatment well    Behavior During Therapy  Restless;Impulsive;WFL for tasks assessed/performed       Past Medical History:  Diagnosis Date  . Cancer ([place])    Non-hodgkin's lymphoma (in remission)  . Knee joint cyst, left 02/07/2018  . Parkinson disease ([place])   . Parkinson disease [facility])     Past Surgical History:  Procedure Laterality Date  . BACK SURGERY    . BUNIONECTOMY    . NASAL SEPTUM SURGERY    . RETINAL DETACHMENT SURGERY      There were no vitals filed for this visit.  Subjective Assessment - 08/02/18 1002    Subjective  Back was bothering her a lot yesterday. Not as bad today, sore. 5/10 back pain currently. Took her Parkinson's medicine.     Pertinent History  Chronic back pain. Has had Parkinsons since she was 67 years old. Has had back pain for years. Worsened around 2 years ago and feels cramping as well as pins and needles R medial and lateral thigh as well as L thigh.  Pt states that [name] increased last week. Difficulty bending her L leg and to kneel due to pain. Wears a knee brace to help with the swelling.   Pt states that when she walks on her L LE and steps on it, it gives way which started around the  past 2 days.   Pt states that her back pain has gotten worse since 2 years ago.  Walking makes her legs feel heavy. Pt uses her SPC sporadically when her legs bother her.  Denies loss of bowel or bladder control.  Pt unsure if she has tingling or numness bilateral inner thigh.  Pt also states having Non-Hodgkins lymphoma cancer.   Pt states tinlging and numbness bilateral posterior LE going down to her feet.   Pt also states falling 3 times within the past 6 months.  Pt tripped on a stool, pt also was running and in a hurry, did not watch where she was going and tripped on a basket. Pt also fell off the bed when she was sleeping.      Patient Stated Goals  Be able to stand up straighter.     Currently in Pain?  Yes    Pain Score  5     Pain Onset  More than a month ago    Pain Onset  More than a month ago  PT Education - 08/02/18 1004    Education Details  ther-ex    Person(s) Educated  Patient    Methods  Explanation;Demonstration;Tactile cues;Verbal cues    Comprehension  Returned demonstration;Verbalized understanding      Objectives    Therapeutic exercise   CGA withgait belt withstanding exercises.   Seated manually resisted trunk extension isometrics at neutral 10x3 with 5 second holds  Seated manually resisted R trunk rotation isometrics 10x5 seconds for 3 sets to decrease L trunk rotation posture   Side stepping with B UE assist at treadmill bar 10x each direction  To promote glute med muscle strengthening  Standing hip abduction with B UE assist 2 lbs each ankle  10x3 each LE   Seated LAQ  R 10x2   3 lbs  L 10x2   3 lbs  Seated L hip extension isometrics 10x5 seconds for 3 sets  Improved exercise technique, movement at target joints, use of target muscles after mod verbal, visual, tactile cues.    Pt arrived late and session adjusted accordingly. Continued working on posture, trunk, and  glute strengthening to help decrease stress to low back when performing standing tasks.           PT Short Term Goals - 05/16/18 1224      PT SHORT TERM GOAL #1   Title  Patient will be independent with her HEP to promote trunk and LE strength, and to help decrease back and bilateral LE pain.     Time  3    Period  Weeks    Status  On-going    Target Date  06/09/18        PT Long Term Goals - 06/27/18 1712      PT LONG TERM GOAL #1   Title  Patient will have a decrease in back pain to 5/10 or less at worst to promote ability to ambulate, peform standing tasks more comfortably.     Baseline  10/10 back pain at worst for the past 3 months. (03/24/2018); 9/10 at worst at night, 4-5/10 during the daytime (05/16/2018); Back pain at worst 6-9/10 for the past 7 days. Pain is not as bad as it usually is sometimes (06/27/2018)      Time  6    Period  Weeks    Status  On-going    Target Date  08/11/18      PT LONG TERM GOAL #2   Title  Patient will improve her lumbar spine FOTO score by at least 10 points as a demonstration of improved function.     Baseline  Lumbar spine FOTO: 37 (03/24/2018); 54 (05/16/2018)    Time  6    Period  Weeks    Status  Achieved    Target Date  06/30/18      PT LONG TERM GOAL #3   Title  Pt will improve bilateral hip abduction and extension strength by at least 1/2 MMT grade to promote ability to perform standing tasks with less back pain.     Time  6    Period  Weeks    Status  Achieved    Target Date  06/30/18            Plan - 08/02/18 1005    Clinical Impression Statement  Pt arrived late and session adjusted accordingly. Continued working on posture, trunk, and glute strengthening to help decrease stress to low back when performing standing tasks.      Rehab Potential  Fair    Clinical Impairments Affecting Rehab Potential  (-) Chronicity of back pain, B LE pain, Parkinson's, age; (+) motivated    PT Frequency  2x / week    PT Duration  6  weeks    PT Treatment/Interventions  Therapeutic activities;Therapeutic exercise;Balance training;Neuromuscular re-education;Patient/family education;Manual techniques;Dry needling;Aquatic Therapy;Electrical Stimulation;Iontophoresis 4mg /ml Dexamethasone;Gait training    PT Next Visit Plan  trunk, hip strengthening, lumbopelvic control, gait training, manual techniques, modalities PRN    Consulted and Agree with Plan of Care  Patient;Family member/caregiver    Family Member Consulted  husband       Patient will benefit from skilled therapeutic intervention in order to improve the following deficits and impairments:  Pain, Improper body mechanics, Postural dysfunction, Decreased strength, Difficulty walking, Decreased safety awareness, Decreased balance, Abnormal gait  Visit Diagnosis: Radiculopathy, lumbosacral region  Chronic bilateral low back pain with bilateral sciatica  Difficulty in walking, not elsewhere classified  Muscle weakness (generalized)     Problem List Patient Active Problem List   Diagnosis Date Noted  . Numbness 03/09/2018  . Lumbar spondylosis 03/07/2018  . Chronic pain of left knee 03/07/2018  . Chronic upper back pain 12/08/2017  . Benign neoplasm of hand 10/20/2017  . Chronic pain syndrome 10/20/2017  . Chronic pain of right lower extremity 10/20/2017  . Chronic myofascial pain 10/20/2017  . Numbness and tingling 07/12/2017  . Sleep difficulties 07/12/2017  . Low back pain 01/18/2017  . Sleep behavior disorder, REM 01/18/2017  . Lumbar radiculopathy 11/01/2016  . Follicular lymphoma of extranodal and solid organ sites [facility]) 05/01/2016  . Lymphoma ([place]) 04/30/2016  . Hallux limitus 07/27/2015  . Low back pain with sciatica 02/13/2015  . Bilateral low back pain with sciatica 02/13/2015  . H/O adenomatous polyp of colon 04/06/2014  . Hx of adenomatous colonic polyps 04/06/2014  . Back ache 01/25/2014  . Adiposity 01/25/2014  . REM sleep behavior  disorder 01/25/2014  . Disordered sleep 01/25/2014  . Has a tremor 01/25/2014  . Obesity, unspecified 01/25/2014  . Sleep disorder 01/25/2014  . Backache 01/25/2014  . Tremor 01/25/2014  . Obesity 01/25/2014  . Difficulty hearing 06/08/2012  . Hearing loss 06/08/2012  . Anxiety 06/06/2012  . Colon polyp 06/06/2012  . Clinical depression 06/06/2012  . Hypercholesteremia 06/06/2012  . Idiopathic Parkinson's disease ([place]) 06/06/2012  . Detached retina 06/06/2012  . Depression 06/06/2012  . Retinal detachment 06/06/2012  . Parkinson's disease ([place]) 06/06/2012    Joneen Boers PT, DPT   08/02/2018, 12:31 PM  [facility] [address] [place], [address] Phone: [phone]   Fax:  [phone]  Name: Sarah Donaldson MRN: [id number] Date of Birth: Jun 17, 1951

## 2018-08-04 ENCOUNTER — Ambulatory Visit: Payer: Medicare Other

## 2018-08-05 ENCOUNTER — Ambulatory Visit
Admission: RE | Admit: 2018-08-05 | Discharge: 2018-08-05 | Disposition: A | Discharge status: Home / Self Care | Payer: Medicare Other | Source: Ambulatory Visit | Attending: Unknown Physician Specialty | Admitting: Unknown Physician Specialty

## 2018-08-05 DIAGNOSIS — E041 Nontoxic single thyroid nodule: Secondary | ICD-10-CM | POA: Insufficient documentation

## 2018-08-05 DIAGNOSIS — E042 Nontoxic multinodular goiter: Secondary | ICD-10-CM | POA: Diagnosis not present

## 2018-08-08 ENCOUNTER — Ambulatory Visit: Payer: Medicare Other

## 2018-08-08 DIAGNOSIS — M5441 Lumbago with sciatica, right side: Secondary | ICD-10-CM

## 2018-08-08 DIAGNOSIS — R262 Difficulty in walking, not elsewhere classified: Secondary | ICD-10-CM

## 2018-08-08 DIAGNOSIS — M5442 Lumbago with sciatica, left side: Secondary | ICD-10-CM

## 2018-08-08 DIAGNOSIS — G8929 Other chronic pain: Secondary | ICD-10-CM | POA: Diagnosis not present

## 2018-08-08 DIAGNOSIS — M6281 Muscle weakness (generalized): Secondary | ICD-10-CM

## 2018-08-08 DIAGNOSIS — M5417 Radiculopathy, lumbosacral region: Secondary | ICD-10-CM | POA: Diagnosis not present

## 2018-08-08 NOTE — Therapy (Signed)
[facility] [address], [place], [address] Phone: [phone]   Fax:  [phone]  Physical Therapy Treatment  Patient Details  Name: Sarah Donaldson MRN: [id number] Date of Birth: 1951-07-19 Referring Provider (PT): Gurney Maxin, MD   Encounter Date: 08/08/2018  PT End of Session - 08/08/18 1436    Visit Number  26    Number of Visits  41    Date for PT Re-Evaluation  08/11/18    Authorization Type  7    Authorization Time Period  of 10 progress report    PT Start Time  1962    PT Stop Time  1517    PT Time Calculation (min)  40 min    Equipment Utilized During Treatment  Gait belt    Activity Tolerance  Patient limited by pain;Patient tolerated treatment well    Behavior During Therapy  Restless;Impulsive;WFL for tasks assessed/performed       Past Medical History:  Diagnosis Date  . Cancer ([place])    Non-hodgkin's lymphoma (in remission)  . Knee joint cyst, left 02/07/2018  . Parkinson disease ([place])   . Parkinson disease [facility])     Past Surgical History:  Procedure Laterality Date  . BACK SURGERY    . BUNIONECTOMY    . NASAL SEPTUM SURGERY    . RETINAL DETACHMENT SURGERY      There were no vitals filed for this visit.  Subjective Assessment - 08/08/18 1438    Subjective  Pt was sick the other day. Went back to her old Parkinson's medication. Back is sore currently. 7-8/10 currently.   6/10 back pain at most for the past 7 days. The worst pain is when she gets up to go to the bathroom at night.     Pertinent History  Chronic back pain. Has had Parkinsons since she was 67 years old. Has had back pain for years. Worsened around 2 years ago and feels cramping as well as pins and needles R medial and lateral thigh as well as L thigh.  Pt states that [name] increased last week. Difficulty bending her L leg and to kneel due to pain. Wears a knee brace to help with the swelling.   Pt  states that when she walks on her L LE and steps on it, it gives way which started around the past 2 days.   Pt states that her back pain has gotten worse since 2 years ago.  Walking makes her legs feel heavy. Pt uses her SPC sporadically when her legs bother her.  Denies loss of bowel or bladder control.  Pt unsure if she has tingling or numness bilateral inner thigh.  Pt also states having Non-Hodgkins lymphoma cancer.   Pt states tinlging and numbness bilateral posterior LE going down to her feet.   Pt also states falling 3 times within the past 6 months.  Pt tripped on a stool, pt also was running and in a hurry, did not watch where she was going and tripped on a basket. Pt also fell off the bed when she was sleeping.      Patient Stated Goals  Be able to stand up straighter.     Currently in Pain?  Yes    Pain Score  8    7-8/10    Pain Onset  More than a month ago    Pain Onset  More than a month ago  PT Education - 08/08/18 1441    Education Details  ther-ex    Person(s) Educated  Patient    Methods  Explanation;Demonstration;Tactile cues;Verbal cues    Comprehension  Verbalized understanding;Returned demonstration      Objectives  6/10 back pain at most for the past 7 days (08/08/2018). The worst pain is when she gets up to go to the bathroom.   Therapeutic exercise   CGA withgait belt withstanding exercises.   Seated manually resisted trunk extension isometrics at neutral 10x3 with 5 second holds  Pt states no pain during exercise  Sitting on chair with chair against table (to keep chair from tilting back  Seated thoracic extension 10x5 seconds pillow against back for 3 sets  No sitting back pain after aforementioned exercises  Seated manually resisted R trunk rotation isometrics 10x5 seconds for 3 sets to decrease L trunk rotation posture   Side stepping with B UE assist at treadmill bar 10x each  direction to promote glute med muscle strengthening.           Standing hip abduction with B UE assist 2 lbs each ankle             10x3 each LE  Seated LAQ             R 10x2   3 lbs             L 10x2   3 lbs  Standing straight pallof press resisting double red band 10x5 seconds  Then 8x10 seconds  Cues to decrease low back extension compensation.  Forward step up onto 4 inch step with B UE Assist 10x2 each LE   Improved exercise technique, movement at target joints, use of target muscles after mod verbal, visual, tactile cues.    Decreased back pain with activation of trunk extensor muscles per pt reports. Possible difficulty understanding pain scale secondary to pt response of 6/10 back pain at most for the past seven days with a starting pain level of 7-8/10 at start of session. Continued working on trunk and hip strengthening as well as posture to help decrease back pain and improve function. Pt tolerated session well without aggravation of symptoms.                 PT Short Term Goals - 05/16/18 1224      PT SHORT TERM GOAL #1   Title  Patient will be independent with her HEP to promote trunk and LE strength, and to help decrease back and bilateral LE pain.     Time  3    Period  Weeks    Status  On-going    Target Date  06/09/18        PT Long Term Goals - 06/27/18 1712      PT LONG TERM GOAL #1   Title  Patient will have a decrease in back pain to 5/10 or less at worst to promote ability to ambulate, peform standing tasks more comfortably.     Baseline  10/10 back pain at worst for the past 3 months. (03/24/2018); 9/10 at worst at night, 4-5/10 during the daytime (05/16/2018); Back pain at worst 6-9/10 for the past 7 days. Pain is not as bad as it usually is sometimes (06/27/2018)      Time  6    Period  Weeks    Status  On-going    Target Date  08/11/18      PT LONG TERM GOAL #2  Title  Patient will improve her lumbar spine FOTO score by at  least 10 points as a demonstration of improved function.     Baseline  Lumbar spine FOTO: 37 (03/24/2018); 54 (05/16/2018)    Time  6    Period  Weeks    Status  Achieved    Target Date  06/30/18      PT LONG TERM GOAL #3   Title  Pt will improve bilateral hip abduction and extension strength by at least 1/2 MMT grade to promote ability to perform standing tasks with less back pain.     Time  6    Period  Weeks    Status  Achieved    Target Date  06/30/18            Plan - 08/08/18 1448    Clinical Impression Statement  Decreased back pain with activation of trunk extensor muscles per pt reports. Possible difficulty understanding pain scale secondary to pt response of 6/10 back pain at most for the past seven days with a starting pain level of 7-8/10 at start of session. Continued working on trunk and hip strengthening as well as posture to help decrease back pain and improve function. Pt tolerated session well without aggravation of symptoms.     Rehab Potential  Fair    Clinical Impairments Affecting Rehab Potential  (-) Chronicity of back pain, B LE pain, Parkinson's, age; (+) motivated    PT Frequency  2x / week    PT Duration  6 weeks    PT Treatment/Interventions  Therapeutic activities;Therapeutic exercise;Balance training;Neuromuscular re-education;Patient/family education;Manual techniques;Dry needling;Aquatic Therapy;Electrical Stimulation;Iontophoresis 4mg /ml Dexamethasone;Gait training    PT Next Visit Plan  trunk, hip strengthening, lumbopelvic control, gait training, manual techniques, modalities PRN    Consulted and Agree with Plan of Care  Patient;Family member/caregiver    Family Member Consulted  husband       Patient will benefit from skilled therapeutic intervention in order to improve the following deficits and impairments:  Pain, Improper body mechanics, Postural dysfunction, Decreased strength, Difficulty walking, Decreased safety awareness, Decreased balance,  Abnormal gait  Visit Diagnosis: Radiculopathy, lumbosacral region  Chronic bilateral low back pain with bilateral sciatica  Difficulty in walking, not elsewhere classified  Muscle weakness (generalized)     Problem List Patient Active Problem List   Diagnosis Date Noted  . Numbness 03/09/2018  . Lumbar spondylosis 03/07/2018  . Chronic pain of left knee 03/07/2018  . Chronic upper back pain 12/08/2017  . Benign neoplasm of hand 10/20/2017  . Chronic pain syndrome 10/20/2017  . Chronic pain of right lower extremity 10/20/2017  . Chronic myofascial pain 10/20/2017  . Numbness and tingling 07/12/2017  . Sleep difficulties 07/12/2017  . Low back pain 01/18/2017  . Sleep behavior disorder, REM 01/18/2017  . Lumbar radiculopathy 11/01/2016  . Follicular lymphoma of extranodal and solid organ sites [facility]) 05/01/2016  . Lymphoma ([place]) 04/30/2016  . Hallux limitus 07/27/2015  . Low back pain with sciatica 02/13/2015  . Bilateral low back pain with sciatica 02/13/2015  . H/O adenomatous polyp of colon 04/06/2014  . Hx of adenomatous colonic polyps 04/06/2014  . Back ache 01/25/2014  . Adiposity 01/25/2014  . REM sleep behavior disorder 01/25/2014  . Disordered sleep 01/25/2014  . Has a tremor 01/25/2014  . Obesity, unspecified 01/25/2014  . Sleep disorder 01/25/2014  . Backache 01/25/2014  . Tremor 01/25/2014  . Obesity 01/25/2014  . Difficulty hearing 06/08/2012  . Hearing loss 06/08/2012  .  Anxiety 06/06/2012  . Colon polyp 06/06/2012  . Clinical depression 06/06/2012  . Hypercholesteremia 06/06/2012  . Idiopathic Parkinson's disease ([place]) 06/06/2012  . Detached retina 06/06/2012  . Depression 06/06/2012  . Retinal detachment 06/06/2012  . Parkinson's disease ([place]) 06/06/2012   Joneen Boers PT, DPT  08/08/2018, 6:59 PM  [facility] [address] [place], [address] Phone: [phone]   Fax:   [phone]  Name: [place] FLETT MRN: [id number] Date of Birth: 05-Dec-1950

## 2018-08-11 ENCOUNTER — Ambulatory Visit: Payer: Medicare Other | Attending: Nurse Practitioner

## 2018-08-11 DIAGNOSIS — G8929 Other chronic pain: Secondary | ICD-10-CM | POA: Diagnosis not present

## 2018-08-11 DIAGNOSIS — M6281 Muscle weakness (generalized): Secondary | ICD-10-CM | POA: Diagnosis not present

## 2018-08-11 DIAGNOSIS — R262 Difficulty in walking, not elsewhere classified: Secondary | ICD-10-CM | POA: Diagnosis not present

## 2018-08-11 DIAGNOSIS — M5441 Lumbago with sciatica, right side: Secondary | ICD-10-CM | POA: Diagnosis not present

## 2018-08-11 DIAGNOSIS — M5442 Lumbago with sciatica, left side: Secondary | ICD-10-CM | POA: Diagnosis not present

## 2018-08-11 DIAGNOSIS — M5417 Radiculopathy, lumbosacral region: Secondary | ICD-10-CM | POA: Diagnosis not present

## 2018-08-11 NOTE — Therapy (Signed)
[facility] [address] [place], [address] Phone: [phone]   Fax:  [phone]  Physical Therapy Treatment And Progress Report (06/27/2018 - 08/11/2018)  Patient Details  Name: Sarah Donaldson MRN: [id number] Date of Birth: [birth year] Referring Provider (PT): Gurney Maxin, MD   Encounter Date: 08/11/2018  PT End of Session - 08/11/18 1351    Visit Number  27    Number of Visits  53    Date for PT Re-Evaluation  09/22/18    Authorization Type  8    Authorization Time Period  of 10 progress report    PT Start Time  1351    PT Stop Time  1432    PT Time Calculation (min)  41 min    Equipment Utilized During Treatment  Gait belt    Activity Tolerance  Patient limited by pain;Patient tolerated treatment well    Behavior During Therapy  Restless;Impulsive;WFL for tasks assessed/performed       Past Medical History:  Diagnosis Date  . Cancer ([place])    Non-hodgkin's lymphoma (in remission)  . Knee joint cyst, left 02/07/2018  . Parkinson disease ([place])   . Parkinson disease [facility])     Past Surgical History:  Procedure Laterality Date  . BACK SURGERY    . BUNIONECTOMY    . NASAL SEPTUM SURGERY    . RETINAL DETACHMENT SURGERY      There were no vitals filed for this visit.  Subjective Assessment - 08/11/18 1353    Subjective  Had a lot of people over at New Years day. Back is sore in the morning. Not too bad right now. 5/10 back pain currently. One night back pain was bad, Another night its fine.   Pt states that she would like to continue PT. Feels like it is helping her walk to the bathroom at night.     Pertinent History  Chronic back pain. Has had Parkinsons since she was 68 years old. Has had back pain for years. Worsened around 2 years ago and feels cramping as well as pins and needles R medial and lateral thigh as well as L thigh.  Pt states that [name] increased last week. Difficulty  bending her L leg and to kneel due to pain. Wears a knee brace to help with the swelling.   Pt states that when she walks on her L LE and steps on it, it gives way which started around the past 2 days.   Pt states that her back pain has gotten worse since 2 years ago.  Walking makes her legs feel heavy. Pt uses her SPC sporadically when her legs bother her.  Denies loss of bowel or bladder control.  Pt unsure if she has tingling or numness bilateral inner thigh.  Pt also states having Non-Hodgkins lymphoma cancer.   Pt states tinlging and numbness bilateral posterior LE going down to her feet.   Pt also states falling 3 times within the past 6 months.  Pt tripped on a stool, pt also was running and in a hurry, did not watch where she was going and tripped on a basket. Pt also fell off the bed when she was sleeping.      Patient Stated Goals  Be able to stand up straighter.     Currently in Pain?  Yes    Pain Score  5     Pain Onset  More than a month ago  Pain Onset  More than a month ago                               PT Education - 08/11/18 1357    Education Details  ther-ex    Person(s) Educated  Patient    Methods  Explanation;Demonstration;Tactile cues;Verbal cues    Comprehension  Returned demonstration;Verbalized understanding         Objectives  6/10 back pain at most for the past 7 days (08/08/2018). The worst pain is when she gets up to go to the bathroom.    Therapeutic exercise   CGA withgait belt withstanding exercises.    Sitting on chair with chair against table (to keep chair from tilting back             Seated thoracic extension 10x5 seconds  for 3 sets. Back discomfort afterwards  Seated manually resisted trunk extension isometrics at neutral 10x3 with 5 second holds             Pt states the exercise helped her back  Seated manually resisted R trunk rotation isometrics 10x5 seconds for 3 sets to decrease L trunk rotation  posture  Standing straight pallof press resisting double red band 10x5 seconds for 3 sets  Reviewed plan of care: 2x/week for 6 weeks  Seated LAQ R 10x3 2 lbs L 10x3 2 lbs  Standing hip abduction with B UE assist 2 lbs each ankle 10x3 each LE  Forward step up onto 4 inch step with B UE Assist 10x3 each LE  Seated B scapular retraction red band 10x, then 10x5 seconds    Improved exercise technique, movement at target joints, use of target muscles after mod verbal, visual, tactile cues.   Pt demonstrates overall slightly improved pain level based on pt subjective reports of 6-8/10 at worst based on previous session's subjective reports. Still has difficulty with back pain at night especially when pt gets up to go to the bathroom, but per patient, going to the bathroom at night is easier since participating in PT. Pt also demonstrates overall improved LE strength and function since initial evaluation (based on last FOTO score). Pt will benefit from continued skilled physical therapy services to decrease back pain, improve strength, and function.                 PT Short Term Goals - 05/16/18 1224      PT SHORT TERM GOAL #70   Title  Patient will be independent with her HEP to promote trunk and LE strength, and to help decrease back and bilateral LE pain.     Time  3    Period  Weeks    Status  On-going    Target Date  70/31/19        PT Long Term Goals - 08/11/18 1548      PT LONG TERM GOAL #1   Title  Patient will have a decrease in back pain to 5/10 or less at worst to promote ability to ambulate, peform standing tasks more comfortably.     Baseline  10/10 back pain at worst for the past 3 months. (03/24/2018); 9/10 at worst at night, 4-5/10 during the daytime (05/16/2018); Back pain at worst 6-9/10 for the past 7 days. Pain is not as bad as it usually is sometimes (06/27/2018), 6-8/10 back pain at worst (08/08/2018)    Time   6    Period  Weeks    Status  On-going    Target Date  70/13/20      PT LONG TERM GOAL #2   Title  Patient will improve her lumbar spine FOTO score by at least 10 points as a demonstration of improved function.     Baseline  Lumbar spine FOTO: 70 (03/24/2018); 70 (05/16/2018)    Time  6    Period  Weeks    Status  Achieved      PT LONG TERM GOAL #3   Title  Pt will improve bilateral hip abduction and extension strength by at least 1/2 MMT grade to promote ability to perform standing tasks with less back pain.     Time  6    Period  Weeks    Status  Achieved            Plan - 08/11/18 1357    Clinical Impression Statement  Pt demonstrates overall slightly improved pain level based on pt subjective reports of 6-8/10 at worst based on previous session's subjective reports. Still has difficulty with back pain at night especially when pt gets up to go to the bathroom, but per patient, going to the bathroom at night is easier since participating in PT. Pt also demonstrates overall improved LE strength and function since initial evaluation (based on last FOTO score). Pt will benefit from continued skilled physical therapy services to decrease back pain, improve strength, and function.       History and Personal Factors relevant to plan of care:  Medical history, chronicity of back and LE pain, Parkinson's, L knee pain, weakness, difficulty walking, difficulty moving around, fall history. Husband support at home.     Clinical Presentation  Stable    Clinical Presentation due to:  Patient making progress with PT towards goals.     Clinical Decision Making  Low    Rehab Potential  Fair    Clinical Impairments Affecting Rehab Potential  (-) Chronicity of back pain, B LE pain, Parkinson's, age; (+) motivated    PT Frequency  2x / week    PT Duration  6 weeks    PT Treatment/Interventions  Therapeutic activities;Therapeutic exercise;Balance training;Neuromuscular re-education;Patient/family  education;Manual techniques;Dry needling;Aquatic Therapy;Electrical Stimulation;Iontophoresis 4mg /ml Dexamethasone;Gait training    PT Next Visit Plan  trunk, hip strengthening, lumbopelvic control, gait training, manual techniques, modalities PRN    Consulted and Agree with Plan of Care  Patient;Family member/caregiver    Family Member Consulted  husband       Patient will benefit from skilled therapeutic intervention in order to improve the following deficits and impairments:  Pain, Improper body mechanics, Postural dysfunction, Decreased strength, Difficulty walking, Decreased safety awareness, Decreased balance, Abnormal gait  Visit Diagnosis: Radiculopathy, lumbosacral region - Plan: PT plan of care cert/re-cert  Chronic bilateral low back pain with bilateral sciatica - Plan: PT plan of care cert/re-cert  Difficulty in walking, not elsewhere classified - Plan: PT plan of care cert/re-cert  Muscle weakness (generalized) - Plan: PT plan of care cert/re-cert     Problem List Patient Active Problem List   Diagnosis Date Noted  . Numbness 03/09/2018  . Lumbar spondylosis 03/07/2018  . Chronic pain of left knee 03/07/2018  . Chronic upper back pain 12/08/2017  . Benign neoplasm of hand 10/20/2017  . Chronic pain syndrome 10/20/2017  . Chronic pain of right lower extremity 10/20/2017  . Chronic myofascial pain 10/20/2017  . Numbness and tingling 07/12/2017  . Sleep difficulties 07/12/2017  . Low  back pain 01/18/2017  . Sleep behavior disorder, REM 01/18/2017  . Lumbar radiculopathy 11/01/2016  . Follicular lymphoma of extranodal and solid organ sites [facility]) 05/01/2016  . Lymphoma ([place]) 04/30/2016  . Hallux limitus 07/27/2015  . Low back pain with sciatica 02/13/2015  . Bilateral low back pain with sciatica 02/13/2015  . H/O adenomatous polyp of colon 04/06/2014  . Hx of adenomatous colonic polyps 04/06/2014  . Back ache 01/25/2014  . Adiposity 01/25/2014  . REM sleep  behavior disorder 01/25/2014  . Disordered sleep 01/25/2014  . Has a tremor 01/25/2014  . Obesity, unspecified 01/25/2014  . Sleep disorder 01/25/2014  . Backache 01/25/2014  . Tremor 01/25/2014  . Obesity 01/25/2014  . Difficulty hearing 06/08/2012  . Hearing loss 06/08/2012  . Anxiety 06/06/2012  . Colon polyp 06/06/2012  . Clinical depression 06/06/2012  . Hypercholesteremia 06/06/2012  . Idiopathic Parkinson's disease ([place]) 06/06/2012  . Detached retina 06/06/2012  . Depression 06/06/2012  . Retinal detachment 06/06/2012  . Parkinson's disease ([place]) 06/06/2012    Thank you for your referral.  Joneen Boers PT, DPT   08/11/2018, 3:54 PM  [facility] [address] [place], [address] Phone: [phone]   Fax:  [phone]  Name: TEIGEN PARSLOW MRN: [id number] Date of Birth: [birth year]

## 2018-08-15 ENCOUNTER — Ambulatory Visit: Payer: Medicare Other

## 2018-08-15 DIAGNOSIS — M6281 Muscle weakness (generalized): Secondary | ICD-10-CM | POA: Diagnosis not present

## 2018-08-15 DIAGNOSIS — R262 Difficulty in walking, not elsewhere classified: Secondary | ICD-10-CM

## 2018-08-15 DIAGNOSIS — G8929 Other chronic pain: Secondary | ICD-10-CM | POA: Diagnosis not present

## 2018-08-15 DIAGNOSIS — M5417 Radiculopathy, lumbosacral region: Secondary | ICD-10-CM

## 2018-08-15 DIAGNOSIS — M5442 Lumbago with sciatica, left side: Secondary | ICD-10-CM | POA: Diagnosis not present

## 2018-08-15 DIAGNOSIS — M5441 Lumbago with sciatica, right side: Secondary | ICD-10-CM | POA: Diagnosis not present

## 2018-08-15 NOTE — Therapy (Signed)
[facility] [address], [place], [address] Phone: [phone]   Fax:  [phone]  Physical Therapy Treatment  Patient Details  Name: GENEAL HUEBERT MRN: [id number] Date of Birth: [birth year] Referring Provider (PT): Gurney Maxin, MD   Encounter Date: 08/15/2018  PT End of Session - 08/15/18 1747    Visit Number  28    Number of Visits  53    Date for PT Re-Evaluation  09/22/18    Authorization Type  1    Authorization Time Period  of 10 progress report    PT Start Time  3474    PT Stop Time  1830    PT Time Calculation (min)  41 min    Equipment Utilized During Treatment  Gait belt    Activity Tolerance  Patient limited by pain;Patient tolerated treatment well    Behavior During Therapy  Restless;Impulsive;WFL for tasks assessed/performed       Past Medical History:  Diagnosis Date  . Cancer ([place])    Non-hodgkin's lymphoma (in remission)  . Knee joint cyst, left 02/07/2018  . Parkinson disease ([place])   . Parkinson disease [facility])     Past Surgical History:  Procedure Laterality Date  . BACK SURGERY    . BUNIONECTOMY    . NASAL SEPTUM SURGERY    . RETINAL DETACHMENT SURGERY      There were no vitals filed for this visit.  Subjective Assessment - 08/15/18 1750    Subjective  Had a pretty good weekend. Did not have to use the walker as much. Felt back pulling but was able to get up and do stuff. Usually unable to do stuff around 6-7 pm. Was able to continue doing stuff around 6-7 pm that weekend. Was sore after last session.      Pertinent History  Chronic back pain. Has had Parkinsons since she was 68 years old. Has had back pain for years. Worsened around 2 years ago and feels cramping as well as pins and needles R medial and lateral thigh as well as L thigh.  Pt states that [name] increased last week. Difficulty bending her L leg and to kneel due to pain. Wears a knee brace to help  with the swelling.   Pt states that when she walks on her L LE and steps on it, it gives way which started around the past 2 days.   Pt states that her back pain has gotten worse since 2 years ago.  Walking makes her legs feel heavy. Pt uses her SPC sporadically when her legs bother her.  Denies loss of bowel or bladder control.  Pt unsure if she has tingling or numness bilateral inner thigh.  Pt also states having Non-Hodgkins lymphoma cancer.   Pt states tinlging and numbness bilateral posterior LE going down to her feet.   Pt also states falling 3 times within the past 6 months.  Pt tripped on a stool, pt also was running and in a hurry, did not watch where she was going and tripped on a basket. Pt also fell off the bed when she was sleeping.      Patient Stated Goals  Be able to stand up straighter.     Currently in Pain?  Yes    Pain Score  6    low back soreness   Pain Onset  More than a month ago    Pain Onset  More than a month  ago                               PT Education - 08/15/18 1805    Education Details  ther-ex    [employer]) Educated  Patient    Methods  Explanation;Demonstration;Tactile cues;Verbal cues    Comprehension  Returned demonstration;Verbalized understanding        Objectives  Therapeutic exercise   CGA withgait belt withstanding exercises.   Sitting on chair with chair against table (to keep chair from tilting back) Seated thoracic extension with pillow at back 10x5 seconds  for 3 sets.   Seated manually resisted trunk extension isometrics at neutral 10x3 with 5 second holds  Seated LAQ R 10x3 2 lbs L 10x3 2 lbs  Seated manually resisted R trunk rotation isometrics 10x5 seconds for 3 sets to decrease L trunk rotation posture  Standing hip abduction with B UE assist 2 lbs each ankle 10x3 each LE  Forward step up onto 4 inch step with B UE assist 10x3 each  LE  Seated B scapular retraction red band 10x, then 10x5 seconds    Standing straight pallof press resisting double red band 10x5 seconds for 3 sets  Standing mini squats with B UE assist 10x  Cues to decrease low back extension with standing posture.   Improved exercise technique, movement at target joints, use of target muscles after min to mod verbal, visual, tactile cues.   Continued working on trunk, glute, and quadriceps strengthening to help decrease pressure to low back, as well as to promote ability to perform standing tasks with less difficulty. No back pain after session per pt. Pt will benefit from continued skilled physical therapy services to decrease pain and improve function.       PT Short Term Goals - 05/16/18 1224      PT SHORT TERM GOAL #1   Title  Patient will be independent with her HEP to promote trunk and LE strength, and to help decrease back and bilateral LE pain.     Time  3    Period  Weeks    Status  On-going    Target Date  06/09/18        PT Long Term Goals - 08/11/18 1548      PT LONG TERM GOAL #1   Title  Patient will have a decrease in back pain to 5/10 or less at worst to promote ability to ambulate, peform standing tasks more comfortably.     Baseline  10/10 back pain at worst for the past 3 months. (03/24/2018); 9/10 at worst at night, 4-5/10 during the daytime (05/16/2018); Back pain at worst 6-9/10 for the past 7 days. Pain is not as bad as it usually is sometimes (06/27/2018), 6-8/10 back pain at worst (08/08/2018)    Time  6    Period  Weeks    Status  On-going    Target Date  09/22/18      PT LONG TERM GOAL #2   Title  Patient will improve her lumbar spine FOTO score by at least 10 points as a demonstration of improved function.     Baseline  Lumbar spine FOTO: 37 (03/24/2018); 54 (05/16/2018)    Time  6    Period  Weeks    Status  Achieved      PT LONG TERM GOAL #3   Title  Pt will improve bilateral hip abduction and extension  strength by at least 1/2 MMT grade to promote ability to perform standing tasks with less back pain.     Time  6    Period  Weeks    Status  Achieved            Plan - 08/15/18 1745    Clinical Impression Statement  Continued working on trunk, glute, and quadriceps strengthening to help decrease pressure to low back, as well as to promote ability to perform standing tasks with less difficulty. No back pain after session per pt. Pt will benefit from continued skilled physical therapy services to decrease pain and improve function.      Rehab Potential  Fair    Clinical Impairments Affecting Rehab Potential  (-) Chronicity of back pain, B LE pain, Parkinson's, age; (+) motivated    PT Frequency  2x / week    PT Duration  6 weeks    PT Treatment/Interventions  Therapeutic activities;Therapeutic exercise;Balance training;Neuromuscular re-education;Patient/family education;Manual techniques;Dry needling;Aquatic Therapy;Electrical Stimulation;Iontophoresis 4mg /ml Dexamethasone;Gait training    PT Next Visit Plan  trunk, hip strengthening, lumbopelvic control, gait training, manual techniques, modalities PRN    Consulted and Agree with Plan of Care  Patient;Family member/caregiver    Family Member Consulted  husband       Patient will benefit from skilled therapeutic intervention in order to improve the following deficits and impairments:  Pain, Improper body mechanics, Postural dysfunction, Decreased strength, Difficulty walking, Decreased safety awareness, Decreased balance, Abnormal gait  Visit Diagnosis: Radiculopathy, lumbosacral region  Chronic bilateral low back pain with bilateral sciatica  Difficulty in walking, not elsewhere classified  Muscle weakness (generalized)     Problem List Patient Active Problem List   Diagnosis Date Noted  . Numbness 03/09/2018  . Lumbar spondylosis 03/07/2018  . Chronic pain of left knee 03/07/2018  . Chronic upper back pain 12/08/2017  .  Benign neoplasm of hand 10/20/2017  . Chronic pain syndrome 10/20/2017  . Chronic pain of right lower extremity 10/20/2017  . Chronic myofascial pain 10/20/2017  . Numbness and tingling 07/12/2017  . Sleep difficulties 07/12/2017  . Low back pain 01/18/2017  . Sleep behavior disorder, REM 01/18/2017  . Lumbar radiculopathy 11/01/2016  . Follicular lymphoma of extranodal and solid organ sites [facility]) 05/01/2016  . Lymphoma ([place]) 04/30/2016  . Hallux limitus 07/27/2015  . Low back pain with sciatica 02/13/2015  . Bilateral low back pain with sciatica 02/13/2015  . H/O adenomatous polyp of colon 04/06/2014  . Hx of adenomatous colonic polyps 04/06/2014  . Back ache 01/25/2014  . Adiposity 01/25/2014  . REM sleep behavior disorder 01/25/2014  . Disordered sleep 01/25/2014  . Has a tremor 01/25/2014  . Obesity, unspecified 01/25/2014  . Sleep disorder 01/25/2014  . Backache 01/25/2014  . Tremor 01/25/2014  . Obesity 01/25/2014  . Difficulty hearing 06/08/2012  . Hearing loss 06/08/2012  . Anxiety 06/06/2012  . Colon polyp 06/06/2012  . Clinical depression 06/06/2012  . Hypercholesteremia 06/06/2012  . Idiopathic Parkinson's disease ([place]) 06/06/2012  . Detached retina 06/06/2012  . Depression 06/06/2012  . Retinal detachment 06/06/2012  . Parkinson's disease ([place]) 06/06/2012    Joneen Boers PT, DPT   08/15/2018, 6:42 PM  [facility] [address] [place], [address] Phone: [phone]   Fax:  [phone]  Name: KANG ISHIDA MRN: [id number] Date of Birth: June 01, 1951

## 2018-08-17 ENCOUNTER — Ambulatory Visit: Payer: Medicare Other

## 2018-08-23 ENCOUNTER — Other Ambulatory Visit: Payer: Self-pay

## 2018-08-23 ENCOUNTER — Encounter: Payer: Self-pay | Admitting: Nurse Practitioner

## 2018-08-23 ENCOUNTER — Ambulatory Visit: Payer: Medicare Other

## 2018-08-23 ENCOUNTER — Ambulatory Visit: Payer: Medicare Other | Attending: Nurse Practitioner | Admitting: Nurse Practitioner

## 2018-08-23 VITALS — BP 122/91 | HR 78 | Temp 98.0°F | Resp 16 | Ht 66.5 in | Wt 163.0 lb

## 2018-08-23 DIAGNOSIS — M5417 Radiculopathy, lumbosacral region: Secondary | ICD-10-CM

## 2018-08-23 DIAGNOSIS — G894 Chronic pain syndrome: Secondary | ICD-10-CM

## 2018-08-23 DIAGNOSIS — G8929 Other chronic pain: Secondary | ICD-10-CM

## 2018-08-23 DIAGNOSIS — R262 Difficulty in walking, not elsewhere classified: Secondary | ICD-10-CM

## 2018-08-23 DIAGNOSIS — M7918 Myalgia, other site: Secondary | ICD-10-CM | POA: Diagnosis not present

## 2018-08-23 DIAGNOSIS — M5442 Lumbago with sciatica, left side: Secondary | ICD-10-CM | POA: Diagnosis not present

## 2018-08-23 DIAGNOSIS — M47816 Spondylosis without myelopathy or radiculopathy, lumbar region: Secondary | ICD-10-CM | POA: Diagnosis not present

## 2018-08-23 DIAGNOSIS — M6281 Muscle weakness (generalized): Secondary | ICD-10-CM

## 2018-08-23 DIAGNOSIS — M5441 Lumbago with sciatica, right side: Secondary | ICD-10-CM

## 2018-08-23 MED ORDER — PREGABALIN 25 MG PO CAPS: 25.0000 mg | ORAL_CAPSULE | Freq: Four times a day (QID) | ORAL | 2 refills | Status: DC

## 2018-08-23 NOTE — Progress Notes (Signed)
Safety precautions to be maintained throughout the outpatient stay will include: orient to surroundings, keep bed in low position, maintain call bell within reach at all times, provide assistance with transfer out of bed and ambulation.  

## 2018-08-23 NOTE — Progress Notes (Signed)
Patient's Name: Sarah Donaldson  MRN: [id number]  Referring Provider: Cletis Athens, MD  DOB: 1951-01-20  PCP: Cletis Athens, MD  DOS: 08/23/2018  Note by: Vevelyn Francois NP  Service setting: Ambulatory outpatient  Specialty: Interventional Pain Management  Location: [facility] (AMB) Pain Management Facility    Patient type: Established    Primary Reason(s) for Visit: Encounter for prescription drug management. (Level of risk: moderate)  CC: Back Pain (lower)  HPI  Sarah Donaldson is a 68 y.o. year old, adult patient, who comes today for a medication management evaluation. He has Anxiety; Back ache; Low back pain with sciatica; Colon polyp; Clinical depression; Difficulty hearing; H/O adenomatous polyp of colon; Hypercholesteremia; Adiposity; Idiopathic Parkinson's disease ([place]); REM sleep behavior disorder; Detached retina; Disordered sleep; Has a tremor; Hallux limitus; Lymphoma ([place]); Follicular lymphoma of extranodal and solid organ sites [facility]); Benign neoplasm of hand; Hearing loss; Numbness and tingling; Sleep difficulties; Chronic pain syndrome; Chronic pain of right lower extremity; Chronic myofascial pain; Obesity, unspecified; Sleep disorder; Backache; Bilateral low back pain with sciatica; Low back pain; Lumbar radiculopathy; Depression; Retinal detachment; Hx of adenomatous colonic polyps; Tremor; Parkinson's disease ([place]); Sleep behavior disorder, REM; Chronic upper back pain; Lumbar spondylosis; Chronic pain of left knee; Numbness; and Obesity on their problem list. His primarily concern today is the Back Pain (lower)  Pain Assessment: Location: Lower, Right, Left Back Radiating: buttocks/hips bilateral, radiates around to front thighs, right side is worse Onset: More than a month ago Duration: Chronic pain Quality: Aching(twisting) Severity: 8 /10 (subjective, self-reported pain score)  Note: Reported level is compatible with observation. Clinically the patient looks like a 2/10 A 3/10 is  viewed as "Moderate" and described as significantly interfering with activities of daily living (ADL). It becomes difficult to feed, bathe, get dressed, get on and off the toilet or to perform personal hygiene functions. Difficult to get in and out of bed or a chair without assistance. Very distracting. With effort, it can be ignored when deeply involved in activities. Discrepancy may suggest symptom exaggeration. When using our objective Pain Scale, levels between 6 and 10/10 are said to belong in an emergency room, as it progressively worsens from a 6/10, described as severely limiting, requiring emergency care not usually available at an outpatient pain management facility. At a 6/10 level, communication becomes difficult and requires great effort. Assistance to reach the emergency department may be required. Facial flushing and profuse sweating along with potentially dangerous increases in heart rate and blood pressure will be evident. Effect on ADL: "I have parkinsons", unable to sit walk stand Modifying factors: medications BP: (!) 122/91  HR: 78  Sarah Donaldson was last scheduled for an appointment on 06/02/2018 for medication management. During today's appointment we reviewed Mr. Sabatino chronic pain status, as well as his outpatient medication regimen.  She feels like her pain is the same.  She has been going to physical therapy.  Denies any recent falls.  She admits that her pain is worse on the evening of physical therapy.  She uses Tylenol arthritis at night.  The patient  reports no history of drug use. His body mass index is 25.91 kg/m.  Further details on both, my assessment(s), as well as the proposed treatment plan, please see below.  Laboratory Chemistry  Inflammation Markers (CRP: Acute Phase) (ESR: Chronic Phase) Lab Results  Component Value Date   ESRSEDRATE 4 06/07/2013  Rheumatology Markers No results found for: RF, ANA, LABURIC, URICUR, LYMEIGGIGMAB,  LYMEABIGMQN, HLAB27                      Renal Function Markers Lab Results  Component Value Date   BUN 23 05/20/2018   CREATININE 0.41 (L) 05/20/2018   GFRAA >60 05/20/2018   GFRNONAA >60 05/20/2018                             Hepatic Function Markers Lab Results  Component Value Date   AST 15 05/20/2018   ALT <5 05/20/2018   ALBUMIN 3.8 05/20/2018   ALKPHOS 95 05/20/2018                        Electrolytes Lab Results  Component Value Date   NA 144 05/20/2018   K 4.0 05/20/2018   CL 109 05/20/2018   CALCIUM 9.1 05/20/2018                        Neuropathy Markers No results found for: VITAMINB12, FOLATE, HGBA1C, HIV                      CNS Tests No results found for: COLORCSF, APPEARCSF, RBCCOUNTCSF, WBCCSF, POLYSCSF, LYMPHSCSF, EOSCSF, PROTEINCSF, GLUCCSF, JCVIRUS, CSFOLI, IGGCSF                      Bone Pathology Markers No results found for: VD25OH, [id number], [id number], [id number], 25OHVITD1, 25OHVITD2, 25OHVITD3, TESTOFREE, TESTOSTERONE                       Coagulation Parameters Lab Results  Component Value Date   PLT 165 05/20/2018                        Cardiovascular Markers Lab Results  Component Value Date   HGB 12.1 05/20/2018   HCT 39.6 05/20/2018                         CA Markers No results found for: CEA, CA125, LABCA2                      Note: Lab results reviewed.  Recent Diagnostic Imaging Results  US THYROID CLINICAL DATA:  Prior ultrasound follow-up. History of left-sided thyroid nodule fine-needle aspiration performed 11/2012.  Note, left-sided thyroid nodule has been found to be hypermetabolic on previous PET-CT including the 05/19/2017 examination.  EXAM: THYROID ULTRASOUND  TECHNIQUE: Ultrasound examination of the thyroid gland and adjacent soft tissues was performed.  COMPARISON:  12/11/2016; 01/21/2015; 07/25/2014; 12/26/2013; 06/26/2013; ultrasound-guided left-sided thyroid nodule  fine-needle aspiration-11/29/2012; PET-CT-05/19/2017  FINDINGS: Parenchymal Echotexture: Mildly heterogenous  Isthmus: Normal in size measures 0.4 cm in diameter, unchanged  Right lobe: Normal in size measuring 4.2 x 1.3 x 1.8 cm, unchanged, previously, 3.7 x 1.4 x 1.6 cm  Left lobe: Normal in size measuring 4.5 x 2.6 x 2.5 cm, unchanged, previously, 3.7 x 2.5 x 2.5 cm  _________________________________________________________  Estimated total number of nodules >/= 1 cm: 1  Number of spongiform nodules >/=  2 cm not described below (TR1): 0  Number of mixed cystic and solid nodules >/= 1.5 cm not described below (TR2): 0  _________________________________________________________  The previously biopsied approximately 2.4 x  2.0 x 1.9 cm nodule within the mid aspect the left lobe of the thyroid (labeled 1), appears similar to ultrasound-guided left-sided thyroid nodule fine-needle aspiration performed 11/29/2012, previously, 2.9 x 2.0 x 1.8 cm. Correlation with prior biopsy results is recommended.  IMPRESSION: 1. No new or enlarging thyroid nodules. 2. Previously biopsied approximately 2.4 cm nodule within the left lobe of the thyroid is unchanged to decreased in size compared to the 11/2012 examination, previously, 2.9 cm. Correlation with prior biopsy results is recommended. Assuming a benign pathologic diagnosis, repeat sampling and/or continued dedicated follow-up is not recommended. Additionally, stability for greater than 5 years is suggestive of a benign etiology.  The above is in keeping with the ACR TI-RADS recommendations - J Am Coll Radiol 2017;14:587-595.  Electronically Signed   By: Sandi Mariscal M.D.   On: 08/05/2018 16:05  Complexity Note: Imaging results reviewed. Results shared with Mr. Brahmbhatt, using Layman's terms.                         Meds   Current Outpatient Medications:  .  acetaminophen (TYLENOL) 650 MG CR tablet, Take 650 mg by mouth 2  (two) times daily., Disp: , Rfl:  .  azithromycin (ZITHROMAX) 250 MG tablet, azithromycin 250 mg tablet, Disp: , Rfl:  .  clonazePAM (KLONOPIN) 2 MG tablet, Take 1-3 mg by mouth See admin instructions. 1 mg every morning and 3 mg at bedtime, Disp: , Rfl:  .  furosemide (LASIX) 40 MG tablet, Take 20 mg by mouth 2 (two) times a week. Every Sunday and every Wednesday/Thursday, Disp: , Rfl:  .  naproxen (NAPROSYN) 500 MG tablet, Take 1 tablet (500 mg total) by mouth daily., Disp: 30 tablet, Rfl: 2 .  neomycin-polymyxin b-dexamethasone (MAXITROL) 6.2-22979-8.9 OINT, 1 application., Disp: , Rfl:  .  pramipexole (MIRAPEX) 1.5 MG tablet, Take 1.5 mg by mouth 3 (three) times daily., Disp: , Rfl: 5 .  prednisoLONE acetate (PRED FORTE) 1 % ophthalmic suspension, Place 1 drop into the left eye daily., Disp: , Rfl: 5 .  pregabalin (LYRICA) 25 MG capsule, Take 1 capsule (25 mg total) by mouth 4 (four) times daily., Disp: 120 capsule, Rfl: 2 .  promethazine (PHENERGAN) 12.5 MG tablet, Take 12.5 mg by mouth every 6 (six) hours as needed for nausea or vomiting., Disp: , Rfl:  .  ranitidine (ZANTAC) 150 MG capsule, Take 150 mg by mouth daily. , Disp: , Rfl:  .  rosuvastatin (CRESTOR) 10 MG tablet, Take by mouth., Disp: , Rfl:  .  selegiline (ELDEPRYL) 5 MG capsule, Take 5 mg by mouth 2 (two) times daily with breakfast and lunch. , Disp: , Rfl: 1 .  traZODone (DESYREL) 50 MG tablet, Take 50-100 mg by mouth at bedtime as needed for sleep. , Disp: , Rfl: 1 .  omeprazole (PRILOSEC) 20 MG capsule, Take 1 capsule (20 mg total) by mouth daily., Disp: 90 capsule, Rfl: 0  ROS  Constitutional: Denies any fever or chills Gastrointestinal: No reported hemesis, hematochezia, vomiting, or acute GI distress Musculoskeletal: Denies any acute onset joint swelling, redness, loss of ROM, or weakness Neurological: No reported episodes of acute onset apraxia, aphasia, dysarthria, agnosia, amnesia, paralysis, loss of coordination, or  loss of consciousness  Allergies  Mr. Garr is allergic to codeine sulfate and gabapentin.  [place]  Drug: Mr. Bari  reports no history of drug use. Alcohol:  reports current alcohol use. Tobacco:  reports that he has quit smoking.  He has never used smokeless tobacco. Medical:  has a past medical history of Cancer ([place]), Hyperlipidemia, Knee joint cyst, left (02/07/2018), Parkinson disease ([place]), and Parkinson disease ([place]). Surgical: Mr. Calandra  has a past surgical history that includes Retinal detachment surgery; Bunionectomy; Nasal septum surgery; and Back surgery. Family: family history includes Heart disease in his mother.  Constitutional Exam  General appearance: Well nourished, well developed, and well hydrated. In no apparent acute distress Vitals:   08/23/18 1122  BP: (!) 122/91  Pulse: 78  Resp: 16  Temp: 98 F (36.7 C)  SpO2: 97%  Weight: 163 lb (73.9 kg)  Height: 5' 6.5" (1.689 m)  Psych/Mental status: Alert, oriented x 3 (person, place, & time)       Eyes: PERLA Respiratory: No evidence of acute respiratory distress   Lumbar Spine Area Exam  Skin & Axial Inspection: No masses, redness, or swelling Alignment: Symmetrical Functional ROM: Unrestricted ROM       Stability: No instability detected Muscle Tone/Strength: Functionally intact. No obvious neuro-muscular anomalies detected. Sensory (Neurological): Unimpaired Palpation: Tender         Gait & Posture Assessment  Ambulation: Unassisted Gait: Relatively normal for age and body habitus Posture: WNL   Lower Extremity Exam    Side: Right lower extremity  Side: Left lower extremity  Stability: No instability observed          Stability: No instability observed          Skin & Extremity Inspection: Skin color, temperature, and hair growth are WNL. No peripheral edema or cyanosis. No masses, redness, swelling, asymmetry, or associated skin lesions. No contractures.  Skin & Extremity Inspection: Skin color,  temperature, and hair growth are WNL. No peripheral edema or cyanosis. No masses, redness, swelling, asymmetry, or associated skin lesions. No contractures.  Functional ROM: Unrestricted ROM                  Functional ROM: Unrestricted ROM                  Muscle Tone/Strength: Functionally intact. No obvious neuro-muscular anomalies detected.  Muscle Tone/Strength: Functionally intact. No obvious neuro-muscular anomalies detected.  Sensory (Neurological): Unimpaired        Sensory (Neurological): Unimpaired        Palpation: No palpable anomalies  Palpation: No palpable anomalies   Assessment  Primary Diagnosis & Pertinent Problem List: The primary encounter diagnosis was Lumbar spondylosis. Diagnoses of Chronic myofascial pain, Chronic bilateral low back pain with right-sided sciatica, and Chronic pain syndrome were also pertinent to this visit.  Status Diagnosis  Controlled Controlled Controlled 1. Lumbar spondylosis   2. Chronic myofascial pain   3. Chronic bilateral low back pain with right-sided sciatica   4. Chronic pain syndrome     Problems updated and reviewed during this visit: No problems updated. Plan of Care  Pharmacotherapy (Medications Ordered): Meds ordered this encounter  Medications  . pregabalin (LYRICA) 25 MG capsule    Sig: Take 1 capsule (25 mg total) by mouth 4 (four) times daily.    Dispense:  120 capsule    Refill:  2    Do not place this medication, or any other prescription from our practice, on "Automatic Refill". Patient may have prescription filled one day early if pharmacy is closed on scheduled refill date.    Order Specific Question:   Supervising Provider    Answer:   Milinda Pointer [[id number]]   New Prescriptions   No  medications on file   Medications administered today: [name] had no medications administered during this visit. Lab-work, procedure(s), and/or referral(s): No orders of the defined types were placed in this  encounter.  Imaging and/or referral(s): None  Interventional therapies: Planned, scheduled, and/or pending:   Not at this time.  Urged patient to use Tylenol arthritis 30 minutes to an hour prior to going to physical therapy to decrease the amount of pain the evening of therapy.   Provider-requested follow-up: Return in about 3 months (around 11/22/2018) for MedMgmt.  Future Appointments  Date Time Provider [place]  08/25/2018  9:00 AM Joneen Boers R, PT [facility] None  08/31/2018  4:15 PM Madaline Savage, PT [facility] None  09/05/2018 11:15 AM Joneen Boers R, PT [facility] None  09/07/2018 10:00 AM Joneen Boers R, PT [facility] None  09/13/2018  1:45 PM Madaline Savage, PT [facility] None  09/15/2018 10:30 AM Joneen Boers R, PT [facility] None  09/20/2018 10:30 AM Joneen Boers R, PT [facility] None  09/22/2018  4:00 PM Madaline Savage, PT [facility] None  09/26/2018  4:45 PM Joneen Boers R, PT [facility] None  09/28/2018  2:30 PM Madaline Savage, PT [facility] None  10/03/2018  9:45 AM Madaline Savage, PT [facility] None  10/05/2018  2:30 PM Madaline Savage, PT [facility] None  11/15/2018 11:15 AM Vevelyn Francois, NP [facility] None  11/18/2018 10:00 AM [facility] None  11/18/2018 10:30 AM Cammie Sickle, MD [facility] None   Primary Care Physician: Cletis Athens, MD Location: [facility] Out[facility] Note by: Vevelyn Francois NP Date: 08/23/2018; Time: 1:25 PM  Pain Score Disclaimer: We use the NRS-11 scale. This is a self-reported, subjective measurement of pain severity with only modest accuracy. It is used primarily to identify changes within a particular patient. It must be understood that outpatient pain scales are significantly less accurate that those used for research, where they can be applied under ideal controlled circumstances with minimal exposure to variables. In reality, the score is likely to be a combination of pain intensity and pain affect, where pain  affect describes the degree of emotional arousal or changes in action readiness caused by the sensory experience of pain. Factors such as social and work situation, setting, emotional state, anxiety levels, expectation, and prior pain experience may influence pain perception and show large inter-individual differences that may also be affected by time variables.  Patient instructions provided during this appointment: Patient Instructions  ____________________________________________________________________________________________  Medication Rules  Purpose: To inform patients, and their family members, of our rules and regulations.  Applies to: All patients receiving prescriptions (written or electronic).  Pharmacy of record: Pharmacy where electronic prescriptions will be sent. If written prescriptions are taken to a different pharmacy, please inform the nursing staff. The pharmacy listed in the electronic medical record should be the one where you would like electronic prescriptions to be sent.  Electronic prescriptions: In compliance with the [place] (STOP) Act of 2017 (Session Lanny Cramp [phone]), effective August 10, 2018, all controlled substances must be electronically prescribed. Calling prescriptions to the pharmacy will cease to exist.  Prescription refills: Only during scheduled appointments. Applies to all prescriptions.  NOTE: The following applies primarily to controlled substances (Opioid* Pain Medications).   Patient's responsibilities: 1. Pain Pills: Bring all pain pills to every appointment (except for procedure appointments). 2. Pill Bottles: Bring pills in original pharmacy bottle. Always bring the newest bottle. Bring bottle,  even if empty. 3. Medication refills: You are responsible for knowing and keeping track of what medications you take and those you need refilled. The day before your appointment: write a list of all  prescriptions that need to be refilled. The day of the appointment: give the list to the admitting nurse. Prescriptions will be written only during appointments. If you forget a medication: it will not be "Called in", "Faxed", or "electronically sent". You will need to get another appointment to get these prescribed. No early refills. Do not call asking to have your prescription filled early. 4. Prescription Accuracy: You are responsible for carefully inspecting your prescriptions before leaving our office. Have the discharge nurse carefully go over each prescription with you, before taking them home. Make sure that your name is accurately spelled, that your address is correct. Check the name and dose of your medication to make sure it is accurate. Check the number of pills, and the written instructions to make sure they are clear and accurate. Make sure that you are given enough medication to last until your next medication refill appointment. 5. Taking Medication: Take medication as prescribed. When it comes to controlled substances, taking less pills or less frequently than prescribed is permitted and encouraged. Never take more pills than instructed. Never take medication more frequently than prescribed.  6. Inform other Doctors: Always inform, all of your healthcare providers, of all the medications you take. 7. Pain Medication from other Providers: You are not allowed to accept any additional pain medication from any other Doctor or Healthcare provider. There are two exceptions to this rule. (see below) In the event that you require additional pain medication, you are responsible for notifying us, as stated below. 8. Medication Agreement: You are responsible for carefully reading and following our Medication Agreement. This must be signed before receiving any prescriptions from our practice. Safely store a copy of your signed Agreement. Violations to the Agreement will result in no further  prescriptions. (Additional copies of our Medication Agreement are available upon request.) 9. Laws, Rules, & Regulations: All patients are expected to follow all Federal and [employer], Rules, [employer]. Ignorance of the Laws does not constitute a valid excuse. The use of any illegal substances is prohibited. 10. Adopted CDC guidelines & recommendations: Target dosing levels will be at or below 60 MME/day. Use of benzodiazepines** is not recommended.  Exceptions: There are only two exceptions to the rule of not receiving pain medications from other Healthcare Providers. 1. Exception #1 (Emergencies): In the event of an emergency (i.e.: accident requiring emergency care), you are allowed to receive additional pain medication. However, you are responsible for: As soon as you are able, call our office [phone], at any time of the day or night, and leave a message stating your name, the date and nature of the emergency, and the name and dose of the medication prescribed. In the event that your call is answered by a member of our staff, make sure to document and save the date, time, and the name of the person that took your information.  2. Exception #2 (Planned Surgery): In the event that you are scheduled by another doctor or dentist to have any type of surgery or procedure, you are allowed (for a period no longer than 30 days), to receive additional pain medication, for the acute post-op pain. However, in this case, you are responsible for picking up a copy of our "Post-op Pain Management for Surgeons" handout, and giving  it to your surgeon or dentist. This document is available at our office, and does not require an appointment to obtain it. Simply go to our office during business hours (Monday-Thursday from 8:00 AM to 4:00 PM) (Friday 8:00 AM to 12:00 Noon) or if you have a scheduled appointment with [place], prior to your surgery, and ask for it by name. In addition, you will need to provide [place]  with your name, name of your surgeon, type of surgery, and date of procedure or surgery.  *Opioid medications include: morphine, codeine, oxycodone, oxymorphone, hydrocodone, hydromorphone, meperidine, tramadol, tapentadol, buprenorphine, fentanyl, methadone. **Benzodiazepine medications include: diazepam (Valium), alprazolam (Xanax), clonazepam (Klonopine), lorazepam (Ativan), clorazepate (Tranxene), chlordiazepoxide (Librium), estazolam (Prosom), oxazepam (Serax), temazepam (Restoril), triazolam (Halcion) (Last updated: 10/07/2017) ____________________________________________________________________________________________

## 2018-08-23 NOTE — Patient Instructions (Signed)
____________________________________________________________________________________________  Medication Rules  Purpose: To inform patients, and their family members, of our rules and regulations.  Applies to: All patients receiving prescriptions (written or electronic).  Pharmacy of record: Pharmacy where electronic prescriptions will be sent. If written prescriptions are taken to a different pharmacy, please inform the nursing staff. The pharmacy listed in the electronic medical record should be the one where you would like electronic prescriptions to be sent.  Electronic prescriptions: In compliance with the  Strengthen Opioid Misuse Prevention (STOP) Act of 2017 (Session Law 2017-74/H243), effective August 10, 2018, all controlled substances must be electronically prescribed. Calling prescriptions to the pharmacy will cease to exist.  Prescription refills: Only during scheduled appointments. Applies to all prescriptions.  NOTE: The following applies primarily to controlled substances (Opioid* Pain Medications).   Patient's responsibilities: 1. Pain Pills: Bring all pain pills to every appointment (except for procedure appointments). 2. Pill Bottles: Bring pills in original pharmacy bottle. Always bring the newest bottle. Bring bottle, even if empty. 3. Medication refills: You are responsible for knowing and keeping track of what medications you take and those you need refilled. The day before your appointment: write a list of all prescriptions that need to be refilled. The day of the appointment: give the list to the admitting nurse. Prescriptions will be written only during appointments. If you forget a medication: it will not be "Called in", "Faxed", or "electronically sent". You will need to get another appointment to get these prescribed. No early refills. Do not call asking to have your prescription filled early. 4. Prescription Accuracy: You are responsible for  carefully inspecting your prescriptions before leaving our office. Have the discharge nurse carefully go over each prescription with you, before taking them home. Make sure that your name is accurately spelled, that your address is correct. Check the name and dose of your medication to make sure it is accurate. Check the number of pills, and the written instructions to make sure they are clear and accurate. Make sure that you are given enough medication to last until your next medication refill appointment. 5. Taking Medication: Take medication as prescribed. When it comes to controlled substances, taking less pills or less frequently than prescribed is permitted and encouraged. Never take more pills than instructed. Never take medication more frequently than prescribed.  6. Inform other Doctors: Always inform, all of your healthcare providers, of all the medications you take. 7. Pain Medication from other Providers: You are not allowed to accept any additional pain medication from any other Doctor or Healthcare provider. There are two exceptions to this rule. (see below) In the event that you require additional pain medication, you are responsible for notifying us, as stated below. 8. Medication Agreement: You are responsible for carefully reading and following our Medication Agreement. This must be signed before receiving any prescriptions from our practice. Safely store a copy of your signed Agreement. Violations to the Agreement will result in no further prescriptions. (Additional copies of our Medication Agreement are available upon request.) 9. Laws, Rules, & Regulations: All patients are expected to follow all Federal and State Laws, Statutes, Rules, & Regulations. Ignorance of the Laws does not constitute a valid excuse. The use of any illegal substances is prohibited. 10. Adopted CDC guidelines & recommendations: Target dosing levels will be at or below 60 MME/day. Use of benzodiazepines** is not  recommended.  Exceptions: There are only two exceptions to the rule of not receiving pain medications from other Healthcare Providers. 1.   Exception #1 (Emergencies): In the event of an emergency (i.e.: accident requiring emergency care), you are allowed to receive additional pain medication. However, you are responsible for: As soon as you are able, call our office [phone], at any time of the day or night, and leave a message stating your name, the date and nature of the emergency, and the name and dose of the medication prescribed. In the event that your call is answered by a member of our staff, make sure to document and save the date, time, and the name of the person that took your information.  2. Exception #2 (Planned Surgery): In the event that you are scheduled by another doctor or dentist to have any type of surgery or procedure, you are allowed (for a period no longer than 30 days), to receive additional pain medication, for the acute post-op pain. However, in this case, you are responsible for picking up a copy of our "Post-op Pain Management for Surgeons" handout, and giving it to your surgeon or dentist. This document is available at our office, and does not require an appointment to obtain it. Simply go to our office during business hours (Monday-Thursday from 8:00 AM to 4:00 PM) (Friday 8:00 AM to 12:00 Noon) or if you have a scheduled appointment with us, prior to your surgery, and ask for it by name. In addition, you will need to provide us with your name, name of your surgeon, type of surgery, and date of procedure or surgery.  *Opioid medications include: morphine, codeine, oxycodone, oxymorphone, hydrocodone, hydromorphone, meperidine, tramadol, tapentadol, buprenorphine, fentanyl, methadone. **Benzodiazepine medications include: diazepam (Valium), alprazolam (Xanax), clonazepam (Klonopine), lorazepam (Ativan), clorazepate (Tranxene), chlordiazepoxide (Librium), estazolam (Prosom),  oxazepam (Serax), temazepam (Restoril), triazolam (Halcion) (Last updated: 10/07/2017) ____________________________________________________________________________________________    

## 2018-08-23 NOTE — Therapy (Signed)
[facility] [address], [place], [address] Phone: [phone]   Fax:  [phone]  Physical Therapy Treatment  Patient Details  Name: Sarah Donaldson MRN: [id number] Date of Birth: 11-Oct-1950 Referring Provider (PT): Gurney Maxin, MD   Encounter Date: 08/23/2018  PT End of Session - 08/23/18 1654    Visit Number  29    Number of Visits  67    Date for PT Re-Evaluation  09/22/18    Authorization Type  2    Authorization Time Period  of 10 progress report    PT Start Time  1648    PT Stop Time  1729    PT Time Calculation (min)  41 min    Equipment Utilized During Treatment  --    Activity Tolerance  Patient tolerated treatment well;Patient limited by pain    Behavior During Therapy  Restless;WFL for tasks assessed/performed       Past Medical History:  Diagnosis Date  . Cancer ([place])    Non-hodgkin's lymphoma (in remission)  . Hyperlipidemia   . Knee joint cyst, left 02/07/2018  . Parkinson disease ([place])   . Parkinson disease [facility])     Past Surgical History:  Procedure Laterality Date  . BACK SURGERY    . BUNIONECTOMY    . NASAL SEPTUM SURGERY    . RETINAL DETACHMENT SURGERY      There were no vitals filed for this visit.  Subjective Assessment - 08/23/18 1651    Subjective  Patient reports that the pain increased today at pain management clinic     Pertinent History  Chronic back pain. Has had Parkinsons since she was 68 years old. Has had back pain for years. Worsened around 2 years ago and feels cramping as well as pins and needles R medial and lateral thigh as well as L thigh.  Pt states that [name] increased last week. Difficulty bending her L leg and to kneel due to pain. Wears a knee brace to help with the swelling.   Pt states that when she walks on her L LE and steps on it, it gives way which started around the past 2 days.   Pt states that her back pain has gotten worse  since 2 years ago.  Walking makes her legs feel heavy. Pt uses her SPC sporadically when her legs bother her.  Denies loss of bowel or bladder control.  Pt unsure if she has tingling or numness bilateral inner thigh.  Pt also states having Non-Hodgkins lymphoma cancer.   Pt states tinlging and numbness bilateral posterior LE going down to her feet.   Pt also states falling 3 times within the past 6 months.  Pt tripped on a stool, pt also was running and in a hurry, did not watch where she was going and tripped on a basket. Pt also fell off the bed when she was sleeping.      Patient Stated Goals  Be able to stand up straighter.     Currently in Pain?  Yes    Pain Score  10-Worst pain ever    Pain Location  Back    Pain Orientation  Right;Lower    Pain Descriptors / Indicators  Aching;Other (Comment)   radiating, twisting pain   Pain Type  Chronic pain    Pain Onset  More than a month ago       Objectives    Therapeutic exercise  Seated piriformis stretch 3x30sec holds with tactile cues Seated thoracic extension 2x10with 3sec holds Prone lying x74mins to allow for STM, tissue relaxation, centralization of symptoms (needs further assessment) Hamstring stretch 3x30sec bilaterally with PT assist Lower trunk rotation 10x3sec Supine hip abduction with YTB verbal/tactile cues   Improved exercise technique, movement at target joints, use of target muscles after mod verbal, visual, tactile cues.    Manual therapy: STM superficial and deep techniques to R posterior hip musculature, lumbar and thoracic paraspinals to decrease pain/muscle tension in prone position.   PT Education - 08/23/18 1730    Education Details  ther-ex, correct posture    Person(s) Educated  Patient    Methods  Explanation;Demonstration;Verbal cues;Tactile cues    Comprehension  Verbalized understanding;Returned demonstration;Need further instruction       PT Short Term Goals - 05/16/18 1224      PT SHORT TERM  GOAL #1   Title  Patient will be independent with her HEP to promote trunk and LE strength, and to help decrease back and bilateral LE pain.     Time  3    Period  Weeks    Status  On-going    Target Date  06/09/18        PT Long Term Goals - 08/11/18 1548      PT LONG TERM GOAL #1   Title  Patient will have a decrease in back pain to 5/10 or less at worst to promote ability to ambulate, peform standing tasks more comfortably.     Baseline  10/10 back pain at worst for the past 3 months. (03/24/2018); 9/10 at worst at night, 4-5/10 during the daytime (05/16/2018); Back pain at worst 6-9/10 for the past 7 days. Pain is not as bad as it usually is sometimes (06/27/2018), 6-8/10 back pain at worst (08/08/2018)    Time  6    Period  Weeks    Status  On-going    Target Date  09/22/18      PT LONG TERM GOAL #2   Title  Patient will improve her lumbar spine FOTO score by at least 10 points as a demonstration of improved function.     Baseline  Lumbar spine FOTO: 37 (03/24/2018); 54 (05/16/2018)    Time  6    Period  Weeks    Status  Achieved      PT LONG TERM GOAL #3   Title  Pt will improve bilateral hip abduction and extension strength by at least 1/2 MMT grade to promote ability to perform standing tasks with less back pain.     Time  6    Period  Weeks    Status  Achieved            Plan - 08/23/18 1730    Clinical Impression Statement  Patient with decreased pain at end of session, 10/10 to 7/10. Session focused on pain management to include soft tissue mobilization, correct posture, and tissue extensiblility with fair carry over with multimodal cues. Most challenged with maintaining midline in all positions, verbal cues to promote. The patient would benefit from further skilled Pt to continue education, pain management, and to improve function.    Rehab Potential  Fair    Clinical Impairments Affecting Rehab Potential  (-) Chronicity of back pain, B LE pain, Parkinson's, age;  (+) motivated    PT Frequency  2x / week    PT Duration  6 weeks    PT Treatment/Interventions  Therapeutic  activities;Therapeutic exercise;Balance training;Neuromuscular re-education;Patient/family education;Manual techniques;Dry needling;Aquatic Therapy;Electrical Stimulation;Iontophoresis 4mg /ml Dexamethasone;Gait training    PT Next Visit Plan  trunk, hip strengthening, lumbopelvic control, gait training, manual techniques, modalities PRN    Consulted and Agree with Plan of Care  Patient       Patient will benefit from skilled therapeutic intervention in order to improve the following deficits and impairments:  Pain, Improper body mechanics, Postural dysfunction, Decreased strength, Difficulty walking, Decreased safety awareness, Decreased balance, Abnormal gait  Visit Diagnosis: Radiculopathy, lumbosacral region  Chronic bilateral low back pain with bilateral sciatica  Difficulty in walking, not elsewhere classified  Muscle weakness (generalized)     Problem List Patient Active Problem List   Diagnosis Date Noted  . Numbness 03/09/2018  . Lumbar spondylosis 03/07/2018  . Chronic pain of left knee 03/07/2018  . Chronic upper back pain 12/08/2017  . Benign neoplasm of hand 10/20/2017  . Chronic pain syndrome 10/20/2017  . Chronic pain of right lower extremity 10/20/2017  . Chronic myofascial pain 10/20/2017  . Numbness and tingling 07/12/2017  . Sleep difficulties 07/12/2017  . Low back pain 01/18/2017  . Sleep behavior disorder, REM 01/18/2017  . Lumbar radiculopathy 11/01/2016  . Follicular lymphoma of extranodal and solid organ sites [facility]) 05/01/2016  . Lymphoma ([place]) 04/30/2016  . Hallux limitus 07/27/2015  . Low back pain with sciatica 02/13/2015  . Bilateral low back pain with sciatica 02/13/2015  . H/O adenomatous polyp of colon 04/06/2014  . Hx of adenomatous colonic polyps 04/06/2014  . Back ache 01/25/2014  . Adiposity 01/25/2014  . REM sleep behavior  disorder 01/25/2014  . Disordered sleep 01/25/2014  . Has a tremor 01/25/2014  . Obesity, unspecified 01/25/2014  . Sleep disorder 01/25/2014  . Backache 01/25/2014  . Tremor 01/25/2014  . Obesity 01/25/2014  . Difficulty hearing 06/08/2012  . Hearing loss 06/08/2012  . Anxiety 06/06/2012  . Colon polyp 06/06/2012  . Clinical depression 06/06/2012  . Hypercholesteremia 06/06/2012  . Idiopathic Parkinson's disease ([place]) 06/06/2012  . Detached retina 06/06/2012  . Depression 06/06/2012  . Retinal detachment 06/06/2012  . Parkinson's disease ([place]) 06/06/2012   Lieutenant Diego PT, DPT 5:33 PM,08/23/18 [facility] [address] [place], [address] Phone: [phone]   Fax:  [phone]  Name: BLESSED GIRDNER MRN: [id number] Date of Birth: April 22, 1951

## 2018-08-25 ENCOUNTER — Ambulatory Visit: Payer: Medicare Other

## 2018-08-25 DIAGNOSIS — R262 Difficulty in walking, not elsewhere classified: Secondary | ICD-10-CM | POA: Diagnosis not present

## 2018-08-25 DIAGNOSIS — M5417 Radiculopathy, lumbosacral region: Secondary | ICD-10-CM | POA: Diagnosis not present

## 2018-08-25 DIAGNOSIS — G8929 Other chronic pain: Secondary | ICD-10-CM

## 2018-08-25 DIAGNOSIS — M5441 Lumbago with sciatica, right side: Secondary | ICD-10-CM

## 2018-08-25 DIAGNOSIS — M5442 Lumbago with sciatica, left side: Secondary | ICD-10-CM

## 2018-08-25 DIAGNOSIS — M6281 Muscle weakness (generalized): Secondary | ICD-10-CM

## 2018-08-25 NOTE — Patient Instructions (Signed)
   Sit in front of a mirror   Maintain an upright posture without leaning and turning to the left

## 2018-08-25 NOTE — Therapy (Signed)
[facility] [address], [place], [address] Phone: [phone]   Fax:  [phone]  Physical Therapy Treatment And Progress Report (06/27/18 to 08/25/18)  Patient Details   Name: Sarah Donaldson MRN: [id number] Date of Birth: 09/10/1950 Referring Provider (PT): Gurney Maxin, MD   Encounter Date: 08/25/2018  PT End of Session - 08/25/18 1350    Visit Number  30    Number of Visits  46    Date for PT Re-Evaluation  09/22/18    Authorization Type  "10"    Authorization Time Period  of 10 progress report    PT Start Time  1350    PT Stop Time  1432    PT Time Calculation (min)  42 min    Activity Tolerance  Patient tolerated treatment well;Patient limited by pain    Behavior During Therapy  Restless;WFL for tasks assessed/performed;Impulsive       Past Medical History:  Diagnosis Date  . Cancer ([place])    Non-hodgkin's lymphoma (in remission)  . Hyperlipidemia   . Knee joint cyst, left 02/07/2018  . Parkinson disease ([place])   . Parkinson disease [facility])     Past Surgical History:  Procedure Laterality Date  . BACK SURGERY    . BUNIONECTOMY    . NASAL SEPTUM SURGERY    . RETINAL DETACHMENT SURGERY      There were no vitals filed for this visit.  Subjective Assessment - 08/25/18 1352    Subjective  The other night after last session, her back pain was so bad that she could not walk. Usually has a sore hurt after sessions but last time was a hurt hurt.  6/10 back pain currently not that bad at the moment.     Pertinent History  Chronic back pain. Has had Parkinsons since she was 68 years old. Has had back pain for years. Worsened around 2 years ago and feels cramping as well as pins and needles R medial and lateral thigh as well as L thigh.  Pt states that [name] increased last week. Difficulty bending her L leg and to kneel due to pain. Wears a knee brace to help with the swelling.   Pt  states that when she walks on her L LE and steps on it, it gives way which started around the past 2 days.   Pt states that her back pain has gotten worse since 2 years ago.  Walking makes her legs feel heavy. Pt uses her SPC sporadically when her legs bother her.  Denies loss of bowel or bladder control.  Pt unsure if she has tingling or numness bilateral inner thigh.  Pt also states having Non-Hodgkins lymphoma cancer.   Pt states tinlging and numbness bilateral posterior LE going down to her feet.   Pt also states falling 3 times within the past 6 months.  Pt tripped on a stool, pt also was running and in a hurry, did not watch where she was going and tripped on a basket. Pt also fell off the bed when she was sleeping.      Patient Stated Goals  Be able to stand up straighter.     Currently in Pain?  Yes    Pain Score  6     Pain Onset  More than a month ago  PT Education - 08/25/18 1402    Education Details  ther-ex    Person(s) Educated  Patient    Methods  Explanation;Demonstration;Tactile cues;Verbal cues    Comprehension  Returned demonstration;Verbalized understanding      Objectives    Therapeutic exercise   CGA withgait belt withstanding exercises.   Sitting upright   Manually resisted trunk extension at neutral 10x5 seconds for 3 sets  Seated manually resisted R trunk rotation isometrics 10x5 seconds for 3 sets to decrease L trunk rotation posture  Seated manually resisted R trunk side bend to decrease L side bend posture 10x3 with 5 second holds   Mirror visual cues for posture:  Seated clamshell resisting blue band, hips less than 90 degrees flexion 10x3  Sit <> stand from low mat table with emphasis on femoral control and proper trunk posture, blue band resisting hip abd/ER  5x3  Sitting with upright posture in neutral,   Manual trunk perturbation 1 minute x 3     Seated B scapular retraction   10x5  seconds   Improved exercise technique, movement at target joints, use of target muscles after mod to max verbal, visual, tactile cues.    Pt demonstrates overall improved strength and function since initial evaluation. Pain levels at worst however varies. Decreased back pain with decreasing L trunk lean, L trunk rotation and improving trunk strength in neutral posture. Difficulty maintaining proper posture, with patient needing consistent cues correct. Visual cues from the mirror helped the best. Pt will benefit from continued skilled physical therapy services to improve strength, posture, decrease pain, and improve function.             PT Short Term Goals - 05/16/18 1224      PT SHORT TERM GOAL #1   Title  Patient will be independent with her HEP to promote trunk and LE strength, and to help decrease back and bilateral LE pain.     Time  3    Period  Weeks    Status  On-going    Target Date  06/09/18        PT Long Term Goals - 08/25/18 1545      PT LONG TERM GOAL #1   Title  Patient will have a decrease in back pain to 5/10 or less at worst to promote ability to ambulate, peform standing tasks more comfortably.     Baseline  10/10 back pain at worst for the past 3 months. (03/24/2018); 9/10 at worst at night, 4-5/10 during the daytime (05/16/2018); Back pain at worst 6-9/10 for the past 7 days. Pain is not as bad as it usually is sometimes (06/27/2018), 6-8/10 back pain at worst (08/08/2018), 10/10 (08/23/2018)    Time  6    Period  Weeks    Status  On-going    Target Date  09/22/18      PT LONG TERM GOAL #2   Title  Patient will improve her lumbar spine FOTO score by at least 10 points as a demonstration of improved function.     Baseline  Lumbar spine FOTO: 37 (03/24/2018); 54 (05/16/2018)    Time  6    Period  Weeks    Status  Achieved      PT LONG TERM GOAL #3   Title  Pt will improve bilateral hip abduction and extension strength by at least 1/2 MMT grade to promote  ability to perform standing tasks with less back pain.     Time  6    Period  Weeks    Status  Achieved            Plan - 08/25/18 1402    Clinical Impression Statement  Pt demonstrates overall improved strength and function since initial evaluation. Pain levels at worst however varies. Decreased back pain with decreasing L trunk lean, L trunk rotation and improving trunk strength in neutral posture. Difficulty maintaining proper posture, with patient needing consistent cues correct. Visual cues from the mirror helped the best. Pt will benefit from continued skilled physical therapy services to improve strength, posture, decrease pain, and improve function.     History and Personal Factors relevant to plan of care:  Medical history, chronicity of back and LE pain, Parkinson's, L knee pain, weakness, difficulty walking, difficulty moving around, fall history. Husband support at home.      Clinical Presentation  Evolving    Clinical Presentation due to:  worst pain level varies    Clinical Decision Making  Moderate    Rehab Potential  Fair    Clinical Impairments Affecting Rehab Potential  (-) Chronicity of back pain, B LE pain, Parkinson's, age; (+) motivated    PT Frequency  2x / week    PT Duration  6 weeks    PT Treatment/Interventions  Therapeutic activities;Therapeutic exercise;Balance training;Neuromuscular re-education;Patient/family education;Manual techniques;Dry needling;Aquatic Therapy;Electrical Stimulation;Iontophoresis 4mg /ml Dexamethasone;Gait training    PT Next Visit Plan  trunk, hip strengthening, lumbopelvic control, gait training, manual techniques, modalities PRN    Consulted and Agree with Plan of Care  Patient       Patient will benefit from skilled therapeutic intervention in order to improve the following deficits and impairments:  Pain, Improper body mechanics, Postural dysfunction, Decreased strength, Difficulty walking, Decreased safety awareness, Decreased  balance, Abnormal gait  Visit Diagnosis: Radiculopathy, lumbosacral region  Chronic bilateral low back pain with bilateral sciatica  Difficulty in walking, not elsewhere classified  Muscle weakness (generalized)     Problem List Patient Active Problem List   Diagnosis Date Noted  . Numbness 03/09/2018  . Lumbar spondylosis 03/07/2018  . Chronic pain of left knee 03/07/2018  . Chronic upper back pain 12/08/2017  . Benign neoplasm of hand 10/20/2017  . Chronic pain syndrome 10/20/2017  . Chronic pain of right lower extremity 10/20/2017  . Chronic myofascial pain 10/20/2017  . Numbness and tingling 07/12/2017  . Sleep difficulties 07/12/2017  . Low back pain 01/18/2017  . Sleep behavior disorder, REM 01/18/2017  . Lumbar radiculopathy 11/01/2016  . Follicular lymphoma of extranodal and solid organ sites [facility]) 05/01/2016  . Lymphoma ([place]) 04/30/2016  . Hallux limitus 07/27/2015  . Low back pain with sciatica 02/13/2015  . Bilateral low back pain with sciatica 02/13/2015  . H/O adenomatous polyp of colon 04/06/2014  . Hx of adenomatous colonic polyps 04/06/2014  . Back ache 01/25/2014  . Adiposity 01/25/2014  . REM sleep behavior disorder 01/25/2014  . Disordered sleep 01/25/2014  . Has a tremor 01/25/2014  . Obesity, unspecified 01/25/2014  . Sleep disorder 01/25/2014  . Backache 01/25/2014  . Tremor 01/25/2014  . Obesity 01/25/2014  . Difficulty hearing 06/08/2012  . Hearing loss 06/08/2012  . Anxiety 06/06/2012  . Colon polyp 06/06/2012  . Clinical depression 06/06/2012  . Hypercholesteremia 06/06/2012  . Idiopathic Parkinson's disease ([place]) 06/06/2012  . Detached retina 06/06/2012  . Depression 06/06/2012  . Retinal detachment 06/06/2012  . Parkinson's disease ([place]) 06/06/2012    Thank you for your referral.  Joneen Boers  PT, DPT   08/25/2018, 3:57 PM  [facility] [address], [place], [address] Phone: [phone]   Fax:  [phone]  Name: Sarah Donaldson MRN: [id number] Date of Birth: [birth year]

## 2018-08-31 ENCOUNTER — Ambulatory Visit: Payer: Medicare Other

## 2018-08-31 DIAGNOSIS — M6281 Muscle weakness (generalized): Secondary | ICD-10-CM

## 2018-08-31 DIAGNOSIS — M5441 Lumbago with sciatica, right side: Secondary | ICD-10-CM

## 2018-08-31 DIAGNOSIS — M5442 Lumbago with sciatica, left side: Secondary | ICD-10-CM | POA: Diagnosis not present

## 2018-08-31 DIAGNOSIS — G8929 Other chronic pain: Secondary | ICD-10-CM | POA: Diagnosis not present

## 2018-08-31 DIAGNOSIS — R262 Difficulty in walking, not elsewhere classified: Secondary | ICD-10-CM

## 2018-08-31 DIAGNOSIS — M5417 Radiculopathy, lumbosacral region: Secondary | ICD-10-CM

## 2018-08-31 NOTE — Therapy (Signed)
[facility] [address] [place], [address] Phone: [phone]   Fax:  [phone]  Physical Therapy Treatment  Patient Details  Name: Sarah Donaldson MRN: [id number] Date of Birth: Nov 22, 1950 Referring Provider (PT): Gurney Maxin, MD   Encounter Date: 08/31/2018  PT End of Session - 08/31/18 1624    Visit Number  31    Number of Visits  46    Date for PT Re-Evaluation  09/22/18    Authorization Type  1    Authorization Time Period  of 10 progress report    PT Start Time  1624    PT Stop Time  1705    PT Time Calculation (min)  41 min    Activity Tolerance  Patient tolerated treatment well;Patient limited by pain    Behavior During Therapy  Restless;WFL for tasks assessed/performed;Impulsive       Past Medical History:  Diagnosis Date  . Cancer ([place])    Non-hodgkin's lymphoma (in remission)  . Hyperlipidemia   . Knee joint cyst, left 02/07/2018  . Parkinson disease ([place])   . Parkinson disease [facility])     Past Surgical History:  Procedure Laterality Date  . BACK SURGERY    . BUNIONECTOMY    . NASAL SEPTUM SURGERY    . RETINAL DETACHMENT SURGERY      There were no vitals filed for this visit.  Subjective Assessment - 08/31/18 1626    Subjective  Pt states waking up with 9/10 low back pain. Currently undecorating the Christmas decorations. Back was not bad after last session and 2 days after last session (Thurs-Sat).    Pertinent History  Chronic back pain. Has had Parkinsons since she was 68 years old. Has had back pain for years. Worsened around 2 years ago and feels cramping as well as pins and needles R medial and lateral thigh as well as L thigh.  Pt states that [name] increased last week. Difficulty bending her L leg and to kneel due to pain. Wears a knee brace to help with the swelling.   Pt states that when she walks on her L LE and steps on it, it gives way which started around  the past 2 days.   Pt states that her back pain has gotten worse since 2 years ago.  Walking makes her legs feel heavy. Pt uses her SPC sporadically when her legs bother her.  Denies loss of bowel or bladder control.  Pt unsure if she has tingling or numness bilateral inner thigh.  Pt also states having Non-Hodgkins lymphoma cancer.   Pt states tinlging and numbness bilateral posterior LE going down to her feet.   Pt also states falling 3 times within the past 6 months.  Pt tripped on a stool, pt also was running and in a hurry, did not watch where she was going and tripped on a basket. Pt also fell off the bed when she was sleeping.      Patient Stated Goals  Be able to stand up straighter.     Currently in Pain?  Yes    Pain Score  9    pain level does not match clinical presentation   Pain Onset  More than a month ago                               PT Education - 08/31/18 1631  Education Details  ther-ex    Person(s) Educated  Patient    Methods  Explanation;Demonstration;Tactile cues;Verbal cues    Comprehension  Returned demonstration;Verbalized understanding      Objectives    Therapeutic exercise   Mirror visual cues for posture:   Sitting upright              Manually resisted trunk extension at neutral 10x5 seconds for 3 sets  Seated manually resisted R trunk rotation isometrics 10x5 seconds for 3 sets to decrease L trunk rotation posture  Seated manually resisted R trunk side bend to decrease L side bend posture 10x3 with 5 second holds  Sit <> stand from low mat table with emphasis on femoral control and proper trunk posture, blue band resisting hip abd/ER             10x2  Seated clamshell resisting blue band, hips less than 90 degrees flexion 10x3                                       Seated B scapular retraction to promote thoracic extension and decrease low back extension pressure.              10x5 seconds for 3  sets  standing hip abduction with B UE assist  10x  Then with 2 lbs ankle weight 10x2 each LE   Improved exercise technique, movement at target joints, use of target muscles after mod to max verbal, visual, tactile cues.     Good response to last session's treatment based on pt subjective reports. Decreased L side bend, L trunk rotation posture  with mirror visual cues. Felt good muscle workout with exercises. Pt tolerated session well without aggravation of pain. Continued working on trunk and glute strengthening to promote proper posture, and help decrease stress to low back. Pt will benefit from continued skilled physical therapy services to decrease pain, improve strength and function.    PT Short Term Goals - 05/16/18 1224      PT SHORT TERM GOAL #1   Title  Patient will be independent with her HEP to promote trunk and LE strength, and to help decrease back and bilateral LE pain.     Time  3    Period  Weeks    Status  On-going    Target Date  06/09/18        PT Long Term Goals - 08/25/18 1545      PT LONG TERM GOAL #1   Title  Patient will have a decrease in back pain to 5/10 or less at worst to promote ability to ambulate, peform standing tasks more comfortably.     Baseline  10/10 back pain at worst for the past 3 months. (03/24/2018); 9/10 at worst at night, 4-5/10 during the daytime (05/16/2018); Back pain at worst 6-9/10 for the past 7 days. Pain is not as bad as it usually is sometimes (06/27/2018), 6-8/10 back pain at worst (08/08/2018), 10/10 (08/23/2018)    Time  6    Period  Weeks    Status  On-going    Target Date  09/22/18      PT LONG TERM GOAL #2   Title  Patient will improve her lumbar spine FOTO score by at least 10 points as a demonstration of improved function.     Baseline  Lumbar spine FOTO: 37 (03/24/2018); 54 (05/16/2018)  Time  6    Period  Weeks    Status  Achieved      PT LONG TERM GOAL #3   Title  Pt will improve bilateral hip abduction and  extension strength by at least 1/2 MMT grade to promote ability to perform standing tasks with less back pain.     Time  6    Period  Weeks    Status  Achieved            Plan - 08/31/18 1632    Clinical Impression Statement  Good response to last session's treatment based on pt subjective reports. Decreased L side bend, L trunk rotation posture  with mirror visual cues. Felt good muscle workout with exercises. Pt tolerated session well without aggravation of pain. Continued working on trunk and glute strengthening to promote proper posture, and help decrease stress to low back. Pt will benefit from continued skilled physical therapy services to decrease pain, improve strength and function.     Rehab Potential  Fair    Clinical Impairments Affecting Rehab Potential  (-) Chronicity of back pain, B LE pain, Parkinson's, age; (+) motivated    PT Frequency  2x / week    PT Duration  6 weeks    PT Treatment/Interventions  Therapeutic activities;Therapeutic exercise;Balance training;Neuromuscular re-education;Patient/family education;Manual techniques;Dry needling;Aquatic Therapy;Electrical Stimulation;Iontophoresis 4mg /ml Dexamethasone;Gait training    PT Next Visit Plan  trunk, hip strengthening, lumbopelvic control, gait training, manual techniques, modalities PRN    Consulted and Agree with Plan of Care  Patient       Patient will benefit from skilled therapeutic intervention in order to improve the following deficits and impairments:  Pain, Improper body mechanics, Postural dysfunction, Decreased strength, Difficulty walking, Decreased safety awareness, Decreased balance, Abnormal gait  Visit Diagnosis: Radiculopathy, lumbosacral region  Chronic bilateral low back pain with bilateral sciatica  Difficulty in walking, not elsewhere classified  Muscle weakness (generalized)     Problem List Patient Active Problem List   Diagnosis Date Noted  . Numbness 03/09/2018  . Lumbar  spondylosis 03/07/2018  . Chronic pain of left knee 03/07/2018  . Chronic upper back pain 12/08/2017  . Benign neoplasm of hand 10/20/2017  . Chronic pain syndrome 10/20/2017  . Chronic pain of right lower extremity 10/20/2017  . Chronic myofascial pain 10/20/2017  . Numbness and tingling 07/12/2017  . Sleep difficulties 07/12/2017  . Low back pain 01/18/2017  . Sleep behavior disorder, REM 01/18/2017  . Lumbar radiculopathy 11/01/2016  . Follicular lymphoma of extranodal and solid organ sites [facility]) 05/01/2016  . Lymphoma ([place]) 04/30/2016  . Hallux limitus 07/27/2015  . Low back pain with sciatica 02/13/2015  . Bilateral low back pain with sciatica 02/13/2015  . H/O adenomatous polyp of colon 04/06/2014  . Hx of adenomatous colonic polyps 04/06/2014  . Back ache 01/25/2014  . Adiposity 01/25/2014  . REM sleep behavior disorder 01/25/2014  . Disordered sleep 01/25/2014  . Has a tremor 01/25/2014  . Obesity, unspecified 01/25/2014  . Sleep disorder 01/25/2014  . Backache 01/25/2014  . Tremor 01/25/2014  . Obesity 01/25/2014  . Difficulty hearing 06/08/2012  . Hearing loss 06/08/2012  . Anxiety 06/06/2012  . Colon polyp 06/06/2012  . Clinical depression 06/06/2012  . Hypercholesteremia 06/06/2012  . Idiopathic Parkinson's disease ([place]) 06/06/2012  . Detached retina 06/06/2012  . Depression 06/06/2012  . Retinal detachment 06/06/2012  . Parkinson's disease ([place]) 06/06/2012    Joneen Boers PT, DPT   08/31/2018, 5:22 PM  [facility] [address], [place], [address] Phone: [phone]   Fax:  [phone]  Name: SHAYLAN TUTTON MRN: [id number] Date of Birth: 11/29/1950

## 2018-09-05 ENCOUNTER — Ambulatory Visit: Payer: Medicare Other

## 2018-09-05 DIAGNOSIS — G8929 Other chronic pain: Secondary | ICD-10-CM | POA: Diagnosis not present

## 2018-09-05 DIAGNOSIS — R262 Difficulty in walking, not elsewhere classified: Secondary | ICD-10-CM | POA: Diagnosis not present

## 2018-09-05 DIAGNOSIS — M5417 Radiculopathy, lumbosacral region: Secondary | ICD-10-CM | POA: Diagnosis not present

## 2018-09-05 DIAGNOSIS — M5442 Lumbago with sciatica, left side: Secondary | ICD-10-CM

## 2018-09-05 DIAGNOSIS — M6281 Muscle weakness (generalized): Secondary | ICD-10-CM

## 2018-09-05 DIAGNOSIS — M5441 Lumbago with sciatica, right side: Secondary | ICD-10-CM

## 2018-09-05 NOTE — Therapy (Signed)
[facility] [address], [place], [address] Phone: [phone]   Fax:  [phone]  Physical Therapy Treatment  Patient Details  Name: Sarah Donaldson MRN: [id number] Date of Birth: 04/27/1951 Referring Provider (PT): Gurney Maxin, MD   Encounter Date: 09/05/2018  PT End of Session - 09/05/18 1122    Visit Number  32    Number of Visits  36    Date for PT Re-Evaluation  09/22/18    Authorization Type  2    Authorization Time Period  of 10 progress report    PT Start Time  1122    PT Stop Time  1205    PT Time Calculation (min)  43 min    Activity Tolerance  Patient tolerated treatment well;Patient limited by pain    Behavior During Therapy  Restless;WFL for tasks assessed/performed;Impulsive       Past Medical History:  Diagnosis Date  . Cancer ([place])    Non-hodgkin's lymphoma (in remission)  . Hyperlipidemia   . Knee joint cyst, left 02/07/2018  . Parkinson disease ([place])   . Parkinson disease [facility])     Past Surgical History:  Procedure Laterality Date  . BACK SURGERY    . BUNIONECTOMY    . NASAL SEPTUM SURGERY    . RETINAL DETACHMENT SURGERY      There were no vitals filed for this visit.  Subjective Assessment - 09/05/18 1124    Subjective  Pt states being sore from cleaning her attic. Lifted boxes.     Pertinent History  Chronic back pain. Has had Parkinsons since she was 68 years old. Has had back pain for years. Worsened around 2 years ago and feels cramping as well as pins and needles R medial and lateral thigh as well as L thigh.  Pt states that [name] increased last week. Difficulty bending her L leg and to kneel due to pain. Wears a knee brace to help with the swelling.   Pt states that when she walks on her L LE and steps on it, it gives way which started around the past 2 days.   Pt states that her back pain has gotten worse since 2 years ago.  Walking makes her legs feel  heavy. Pt uses her SPC sporadically when her legs bother her.  Denies loss of bowel or bladder control.  Pt unsure if she has tingling or numness bilateral inner thigh.  Pt also states having Non-Hodgkins lymphoma cancer.   Pt states tinlging and numbness bilateral posterior LE going down to her feet.   Pt also states falling 3 times within the past 6 months.  Pt tripped on a stool, pt also was running and in a hurry, did not watch where she was going and tripped on a basket. Pt also fell off the bed when she was sleeping.      Patient Stated Goals  Be able to stand up straighter.     Currently in Pain?  Yes    Pain Score  --   no pain level provided   Pain Onset  More than a month ago                               PT Education - 09/05/18 1126    Education Details  ther-ex    Person(s) Educated  Patient    Methods  Explanation;Demonstration;Tactile cues;Verbal cues  Comprehension  Returned demonstration;Verbalized understanding      Objectives    Therapeutic exercise    Nustep seat 7, arms 7, level 1 for 5 min. Cues for good pace  Mirror visual cues for posture:   Seated B scapular retraction to promote thoracic extension and decrease low back extension pressure.  10x5 seconds for 3 sets  Seated manually resisted trunk extension 10x10 seconds at neutral for 2 sets  Seated manually resisted R trunk rotation isometrics 10x10 seconds for 2 sets to decrease L trunk rotation posture  Seated manually resisted R trunk side bend to decrease L side bend posture 10x2 with 10 second holds  Upgraded hold times for last 3 exercises     Seated clamshell resisting blue band, hips less than 90 degrees flexion 10x3  Sit <> stand from low mat table at chair height with blue band resisting hip abduction/ER   5x2 with use of B UE, cues for femoral control   standing hip abduction with B UE assist          with 2 lbs ankle weight 10x2 each  LE  Forward step up onto 2 inch step with B UE assist for 2x10 each LE  Seated R trunk side bend to stretch L side 10 seconds x 2 sets   Improved exercise technique, movement at target joints, use of target muscles after modto maxverbal, visual, tactile cues.   Improved posture with mirror visual cues. Good muscle use observed with exercises.    Continued working on decreasing L trunk side bend, and rotation to improve posture and decrease stress to low back. Continued utilizing mirror visual cues secondary to most beneficial for pt. Continued working on LE strengthening, especially with glute med muscles to help decrease pelvic drop during gait. Pt will benefit from continued skilled physical therapy services to decrease pain, improve strength and function.     PT Short Term Goals - 05/16/18 1224      PT SHORT TERM GOAL #1   Title  Patient will be independent with her HEP to promote trunk and LE strength, and to help decrease back and bilateral LE pain.     Time  3    Period  Weeks    Status  On-going    Target Date  06/09/18        PT Long Term Goals - 08/25/18 1545      PT LONG TERM GOAL #1   Title  Patient will have a decrease in back pain to 5/10 or less at worst to promote ability to ambulate, peform standing tasks more comfortably.     Baseline  10/10 back pain at worst for the past 3 months. (03/24/2018); 9/10 at worst at night, 4-5/10 during the daytime (05/16/2018); Back pain at worst 6-9/10 for the past 7 days. Pain is not as bad as it usually is sometimes (06/27/2018), 6-8/10 back pain at worst (08/08/2018), 10/10 (08/23/2018)    Time  6    Period  Weeks    Status  On-going    Target Date  09/22/18      PT LONG TERM GOAL #2   Title  Patient will improve her lumbar spine FOTO score by at least 10 points as a demonstration of improved function.     Baseline  Lumbar spine FOTO: 37 (03/24/2018); 54 (05/16/2018)    Time  6    Period  Weeks    Status  Achieved      PT  LONG  TERM GOAL #3   Title  Pt will improve bilateral hip abduction and extension strength by at least 1/2 MMT grade to promote ability to perform standing tasks with less back pain.     Time  6    Period  Weeks    Status  Achieved            Plan - 09/05/18 1127    Clinical Impression Statement  Continued working on decreasing L trunk side bend, and rotation to improve posture and decrease stress to low back. Continued utilizing mirror visual cues secondary to most beneficial for pt. Continued working on LE strengthening, especially with glute med muscles to help decrease pelvic drop during gait. Pt will benefit from continued skilled physical therapy services to decrease pain, improve strength and function.     Rehab Potential  Fair    Clinical Impairments Affecting Rehab Potential  (-) Chronicity of back pain, B LE pain, Parkinson's, age; (+) motivated    PT Frequency  2x / week    PT Duration  6 weeks    PT Treatment/Interventions  Therapeutic activities;Therapeutic exercise;Balance training;Neuromuscular re-education;Patient/family education;Manual techniques;Dry needling;Aquatic Therapy;Electrical Stimulation;Iontophoresis 4mg /ml Dexamethasone;Gait training    PT Next Visit Plan  trunk, hip strengthening, lumbopelvic control, gait training, manual techniques, modalities PRN    Consulted and Agree with Plan of Care  Patient       Patient will benefit from skilled therapeutic intervention in order to improve the following deficits and impairments:  Pain, Improper body mechanics, Postural dysfunction, Decreased strength, Difficulty walking, Decreased safety awareness, Decreased balance, Abnormal gait  Visit Diagnosis: Radiculopathy, lumbosacral region  Chronic bilateral low back pain with bilateral sciatica  Difficulty in walking, not elsewhere classified  Muscle weakness (generalized)     Problem List Patient Active Problem List   Diagnosis Date Noted  . Numbness  03/09/2018  . Lumbar spondylosis 03/07/2018  . Chronic pain of left knee 03/07/2018  . Chronic upper back pain 12/08/2017  . Benign neoplasm of hand 10/20/2017  . Chronic pain syndrome 10/20/2017  . Chronic pain of right lower extremity 10/20/2017  . Chronic myofascial pain 10/20/2017  . Numbness and tingling 07/12/2017  . Sleep difficulties 07/12/2017  . Low back pain 01/18/2017  . Sleep behavior disorder, REM 01/18/2017  . Lumbar radiculopathy 11/01/2016  . Follicular lymphoma of extranodal and solid organ sites [facility]) 05/01/2016  . Lymphoma ([place]) 04/30/2016  . Hallux limitus 07/27/2015  . Low back pain with sciatica 02/13/2015  . Bilateral low back pain with sciatica 02/13/2015  . H/O adenomatous polyp of colon 04/06/2014  . Hx of adenomatous colonic polyps 04/06/2014  . Back ache 01/25/2014  . Adiposity 01/25/2014  . REM sleep behavior disorder 01/25/2014  . Disordered sleep 01/25/2014  . Has a tremor 01/25/2014  . Obesity, unspecified 01/25/2014  . Sleep disorder 01/25/2014  . Backache 01/25/2014  . Tremor 01/25/2014  . Obesity 01/25/2014  . Difficulty hearing 06/08/2012  . Hearing loss 06/08/2012  . Anxiety 06/06/2012  . Colon polyp 06/06/2012  . Clinical depression 06/06/2012  . Hypercholesteremia 06/06/2012  . Idiopathic Parkinson's disease ([place]) 06/06/2012  . Detached retina 06/06/2012  . Depression 06/06/2012  . Retinal detachment 06/06/2012  . Parkinson's disease ([place]) 06/06/2012     Joneen Boers PT, DPT  09/05/2018, 12:23 PM  [facility] [address], [place], [address] Phone: [phone]   Fax:  [phone]  Name: Sarah Donaldson MRN: [id number] Date  of Birth: 02-20-1951

## 2018-09-07 ENCOUNTER — Ambulatory Visit: Payer: Medicare Other

## 2018-09-07 ENCOUNTER — Other Ambulatory Visit: Payer: Self-pay | Admitting: Nurse Practitioner

## 2018-09-07 DIAGNOSIS — M7918 Myalgia, other site: Principal | ICD-10-CM

## 2018-09-07 DIAGNOSIS — G8929 Other chronic pain: Secondary | ICD-10-CM

## 2018-09-09 ENCOUNTER — Telehealth: Payer: Self-pay | Admitting: Nurse Practitioner

## 2018-09-09 DIAGNOSIS — M7052 Other bursitis of knee, left knee: Secondary | ICD-10-CM | POA: Diagnosis not present

## 2018-09-09 DIAGNOSIS — G20 Parkinson's disease: Secondary | ICD-10-CM | POA: Diagnosis not present

## 2018-09-09 DIAGNOSIS — E785 Hyperlipidemia, unspecified: Secondary | ICD-10-CM | POA: Diagnosis not present

## 2018-09-09 DIAGNOSIS — M7051 Other bursitis of knee, right knee: Secondary | ICD-10-CM | POA: Diagnosis not present

## 2018-09-09 NOTE — Telephone Encounter (Signed)
We do not do phone refills.

## 2018-09-09 NOTE — Telephone Encounter (Signed)
[facility] called stating patient needs refill for Naproxen 500 mg. Did not leave phone number, could not understand person talking name.  fax number [phone]

## 2018-09-12 DIAGNOSIS — T07XXXA Unspecified multiple injuries, initial encounter: Secondary | ICD-10-CM | POA: Diagnosis not present

## 2018-09-12 DIAGNOSIS — L853 Xerosis cutis: Secondary | ICD-10-CM | POA: Diagnosis not present

## 2018-09-12 NOTE — Telephone Encounter (Signed)
She was no longer using this at her last visit. She was using the APAP/Arthritis.

## 2018-09-12 NOTE — Telephone Encounter (Signed)
Ok thank you 

## 2018-09-13 ENCOUNTER — Ambulatory Visit: Payer: Medicare Other | Attending: Nurse Practitioner

## 2018-09-13 DIAGNOSIS — M6281 Muscle weakness (generalized): Secondary | ICD-10-CM | POA: Diagnosis not present

## 2018-09-13 DIAGNOSIS — G8929 Other chronic pain: Secondary | ICD-10-CM

## 2018-09-13 DIAGNOSIS — M5441 Lumbago with sciatica, right side: Secondary | ICD-10-CM | POA: Diagnosis not present

## 2018-09-13 DIAGNOSIS — M5417 Radiculopathy, lumbosacral region: Secondary | ICD-10-CM | POA: Insufficient documentation

## 2018-09-13 DIAGNOSIS — R262 Difficulty in walking, not elsewhere classified: Secondary | ICD-10-CM | POA: Insufficient documentation

## 2018-09-13 DIAGNOSIS — M5442 Lumbago with sciatica, left side: Secondary | ICD-10-CM | POA: Diagnosis not present

## 2018-09-13 NOTE — Therapy (Signed)
[facility] [address] [place], [address] Phone: [phone]   Fax:  [phone]  Physical Therapy Treatment  Patient Details  Name: Sarah Donaldson MRN: [id number] Date of Birth: August 24, 1950 Referring Provider (PT): Gurney Maxin, MD   Encounter Date: 09/13/2018  PT End of Session - 09/13/18 1352    Visit Number  33    Number of Visits  77    Date for PT Re-Evaluation  09/22/18    Authorization Type  3    Authorization Time Period  of 10 progress report    PT Start Time  4650    PT Stop Time  1440    PT Time Calculation (min)  47 min    Activity Tolerance  Patient tolerated treatment well;Patient limited by pain    Behavior During Therapy  Restless;WFL for tasks assessed/performed;Impulsive       Past Medical History:  Diagnosis Date  . Cancer ([place])    Non-hodgkin's lymphoma (in remission)  . Hyperlipidemia   . Knee joint cyst, left 02/07/2018  . Parkinson disease ([place])   . Parkinson disease [facility])     Past Surgical History:  Procedure Laterality Date  . BACK SURGERY    . BUNIONECTOMY    . NASAL SEPTUM SURGERY    . RETINAL DETACHMENT SURGERY      There were no vitals filed for this visit.  Subjective Assessment - 09/13/18 1354    Subjective  Took a benadryl for her spider bites. Tired.  Back really hurt last time. Feels like the spider bites made her back pain worse.  Back was not bad yeterday which was wierd.     Pertinent History  Chronic back pain. Has had Parkinsons since she was 68 years old. Has had back pain for years. Worsened around 2 years ago and feels cramping as well as pins and needles R medial and lateral thigh as well as L thigh.  Pt states that [name] increased last week. Difficulty bending her L leg and to kneel due to pain. Wears a knee brace to help with the swelling.   Pt states that when she walks on her L LE and steps on it, it gives way which started around the  past 2 days.   Pt states that her back pain has gotten worse since 2 years ago.  Walking makes her legs feel heavy. Pt uses her SPC sporadically when her legs bother her.  Denies loss of bowel or bladder control.  Pt unsure if she has tingling or numness bilateral inner thigh.  Pt also states having Non-Hodgkins lymphoma cancer.   Pt states tinlging and numbness bilateral posterior LE going down to her feet.   Pt also states falling 3 times within the past 6 months.  Pt tripped on a stool, pt also was running and in a hurry, did not watch where she was going and tripped on a basket. Pt also fell off the bed when she was sleeping.      Patient Stated Goals  Be able to stand up straighter.     Currently in Pain?  Yes    Pain Score  8    pt does not appear in distress   Pain Onset  More than a month ago                               PT  Education - 09/13/18 1404    Education Details  ther-ex    Person(s) Educated  Patient    Methods  Explanation;Demonstration;Tactile cues;Verbal cues    Comprehension  Returned demonstration;Verbalized understanding        Objectives   Therapeutic exercise    Seated manually resisted trunk extension 10x10 seconds at neutral for 2 sets  Nustep seat 7, arms 7, level 1 for 5 min. Cues for good pace  (4 min unbilled)  Mirror visual cues for posture:  standing hip abduction with B UE assist with 2 lbs ankle weight 10x3 each LE  SLS with B UE assist to promote glute med muscle strengthening  R 10x5 seconds x2  L 10x5 seconds x 2  Forward step up onto 4 inch step with B UE assist for 2x10 each LE  Sit <> stand on chair with arms 7x with B UE to no UE assist. Able to perform 4x without UE assist.    Seated B scapular retractionto promote thoracic extension and decrease low back extension pressure. 10x5 secondsfor 3 sets  Seated manually resisted R trunk side bend at neutral to decrease L side  bend posture 10x3 with 10 second holds   Seated manually resisted R trunk rotation isometrics at neutral  10x5 seconds for 3 sets to decrease L trunk rotation posture     Improved exercise technique, movement at target joints, use of target muscles after mod verbal, visual, tactile cues.    Slight decrease in back pain after session. Continued working on improving LE strength, especially her glute med muscles, as well as improving trunk strength and posture to help decrease pressure to low back. Difficulty correcting posture secondary to habit and pt states that she does not feel straight when pt is actually in a straight posture instead of L lateral shift. Pt tolerated session well without aggravation of symptoms. Pt will benefit from continued skilled physical therapy services to improve strength and function.      PT Short Term Goals - 05/16/18 1224      PT SHORT TERM GOAL #1   Title  Patient will be independent with her HEP to promote trunk and LE strength, and to help decrease back and bilateral LE pain.     Time  3    Period  Weeks    Status  On-going    Target Date  06/09/18        PT Long Term Goals - 08/25/18 1545      PT LONG TERM GOAL #1   Title  Patient will have a decrease in back pain to 5/10 or less at worst to promote ability to ambulate, peform standing tasks more comfortably.     Baseline  10/10 back pain at worst for the past 3 months. (03/24/2018); 9/10 at worst at night, 4-5/10 during the daytime (05/16/2018); Back pain at worst 6-9/10 for the past 7 days. Pain is not as bad as it usually is sometimes (06/27/2018), 6-8/10 back pain at worst (08/08/2018), 10/10 (08/23/2018)    Time  6    Period  Weeks    Status  On-going    Target Date  09/22/18      PT LONG TERM GOAL #2   Title  Patient will improve her lumbar spine FOTO score by at least 10 points as a demonstration of improved function.     Baseline  Lumbar spine FOTO: 37 (03/24/2018); 54 (05/16/2018)     Time  6    Period  Weeks    Status  Achieved      PT LONG TERM GOAL #3   Title  Pt will improve bilateral hip abduction and extension strength by at least 1/2 MMT grade to promote ability to perform standing tasks with less back pain.     Time  6    Period  Weeks    Status  Achieved            Plan - 09/13/18 1404    Clinical Impression Statement  Slight decrease in back pain after session. Continued working on improving LE strength, especially her glute med muscles, as well as improving trunk strength and posture to help decrease pressure to low back. Difficulty correcting posture secondary to habit and pt states that she does not feel straight when pt is actually in a straight posture instead of L lateral shift. Pt tolerated session well without aggravation of symptoms. Pt will benefit from continued skilled physical therapy services to improve strength and function.     Rehab Potential  Fair    Clinical Impairments Affecting Rehab Potential  (-) Chronicity of back pain, B LE pain, Parkinson's, age; (+) motivated    PT Frequency  2x / week    PT Duration  6 weeks    PT Treatment/Interventions  Therapeutic activities;Therapeutic exercise;Balance training;Neuromuscular re-education;Patient/family education;Manual techniques;Dry needling;Aquatic Therapy;Electrical Stimulation;Iontophoresis 4mg /ml Dexamethasone;Gait training    PT Next Visit Plan  trunk, hip strengthening, lumbopelvic control, gait training, manual techniques, modalities PRN    Consulted and Agree with Plan of Care  Patient       Patient will benefit from skilled therapeutic intervention in order to improve the following deficits and impairments:  Pain, Improper body mechanics, Postural dysfunction, Decreased strength, Difficulty walking, Decreased safety awareness, Decreased balance, Abnormal gait  Visit Diagnosis: Radiculopathy, lumbosacral region  Chronic bilateral low back pain with bilateral  sciatica  Difficulty in walking, not elsewhere classified  Muscle weakness (generalized)     Problem List Patient Active Problem List   Diagnosis Date Noted  . Numbness 03/09/2018  . Lumbar spondylosis 03/07/2018  . Chronic pain of left knee 03/07/2018  . Chronic upper back pain 12/08/2017  . Benign neoplasm of hand 10/20/2017  . Chronic pain syndrome 10/20/2017  . Chronic pain of right lower extremity 10/20/2017  . Chronic myofascial pain 10/20/2017  . Numbness and tingling 07/12/2017  . Sleep difficulties 07/12/2017  . Low back pain 01/18/2017  . Sleep behavior disorder, REM 01/18/2017  . Lumbar radiculopathy 11/01/2016  . Follicular lymphoma of extranodal and solid organ sites [facility]) 05/01/2016  . Lymphoma ([place]) 04/30/2016  . Hallux limitus 07/27/2015  . Low back pain with sciatica 02/13/2015  . Bilateral low back pain with sciatica 02/13/2015  . H/O adenomatous polyp of colon 04/06/2014  . Hx of adenomatous colonic polyps 04/06/2014  . Back ache 01/25/2014  . Adiposity 01/25/2014  . REM sleep behavior disorder 01/25/2014  . Disordered sleep 01/25/2014  . Has a tremor 01/25/2014  . Obesity, unspecified 01/25/2014  . Sleep disorder 01/25/2014  . Backache 01/25/2014  . Tremor 01/25/2014  . Obesity 01/25/2014  . Difficulty hearing 06/08/2012  . Hearing loss 06/08/2012  . Anxiety 06/06/2012  . Colon polyp 06/06/2012  . Clinical depression 06/06/2012  . Hypercholesteremia 06/06/2012  . Idiopathic Parkinson's disease ([place]) 06/06/2012  . Detached retina 06/06/2012  . Depression 06/06/2012  . Retinal detachment 06/06/2012  . Parkinson's disease ([place]) 06/06/2012    Joneen Boers PT, DPT   09/13/2018, 2:56  PM  [facility] [address], [place], [address] Phone: [phone]   Fax:  [phone]  Name: ALMAROSA BOHAC MRN: [id number] Date of Birth: April 05, 1951

## 2018-09-15 ENCOUNTER — Ambulatory Visit: Payer: Medicare Other

## 2018-09-20 ENCOUNTER — Ambulatory Visit: Payer: Medicare Other

## 2018-09-20 DIAGNOSIS — M6281 Muscle weakness (generalized): Secondary | ICD-10-CM | POA: Diagnosis not present

## 2018-09-20 DIAGNOSIS — M5441 Lumbago with sciatica, right side: Secondary | ICD-10-CM

## 2018-09-20 DIAGNOSIS — M5417 Radiculopathy, lumbosacral region: Secondary | ICD-10-CM | POA: Diagnosis not present

## 2018-09-20 DIAGNOSIS — R262 Difficulty in walking, not elsewhere classified: Secondary | ICD-10-CM | POA: Diagnosis not present

## 2018-09-20 DIAGNOSIS — M5442 Lumbago with sciatica, left side: Secondary | ICD-10-CM | POA: Diagnosis not present

## 2018-09-20 DIAGNOSIS — G8929 Other chronic pain: Secondary | ICD-10-CM

## 2018-09-20 NOTE — Patient Instructions (Signed)
Seated trunk extension at chair with pillow cushion 10x5 seconds for 3 sets    Reviewed and given as part of her HEP. Pt demonstrated and verbalized understanding.

## 2018-09-20 NOTE — Therapy (Signed)
[facility] [address], [place], [address] Phone: [phone]   Fax:  [phone]  Physical Therapy Treatment  Patient Details  Name: Sarah Donaldson MRN: [id number] Date of Birth: 08-Jan-1951 Referring Provider (PT): Gurney Maxin, MD   Encounter Date: 09/20/2018  PT End of Session - 09/20/18 1122    Visit Number  34    Number of Visits  85    Date for PT Re-Evaluation  09/22/18    Authorization Type  4    Authorization Time Period  of 10 progress report    PT Start Time  1122   pt treatment delayed secondary to pt getting soap out of eye   PT Stop Time  1202    PT Time Calculation (min)  40 min    Activity Tolerance  Patient tolerated treatment well;Patient limited by pain    Behavior During Therapy  Restless;WFL for tasks assessed/performed;Impulsive       Past Medical History:  Diagnosis Date  . Cancer ([place])    Non-hodgkin's lymphoma (in remission)  . Hyperlipidemia   . Knee joint cyst, left 02/07/2018  . Parkinson disease ([place])   . Parkinson disease [facility])     Past Surgical History:  Procedure Laterality Date  . BACK SURGERY    . BUNIONECTOMY    . NASAL SEPTUM SURGERY    . RETINAL DETACHMENT SURGERY      There were no vitals filed for this visit.  Subjective Assessment - 09/20/18 1124    Subjective  Left eye hurting because she got soap in it. Was trying to get it out in the bathroom. Back is bothering her in the L side. 9/10 currently,  7/10 at most for the past 7 days before today. Had to baby sit the other day and was not able to come to PT.     Pertinent History  Chronic back pain. Has had Parkinsons since she was 68 years old. Has had back pain for years. Worsened around 2 years ago and feels cramping as well as pins and needles R medial and lateral thigh as well as L thigh.  Pt states that [name] increased last week. Difficulty bending her L leg and to kneel due to pain.  Wears a knee brace to help with the swelling.   Pt states that when she walks on her L LE and steps on it, it gives way which started around the past 2 days.   Pt states that her back pain has gotten worse since 2 years ago.  Walking makes her legs feel heavy. Pt uses her SPC sporadically when her legs bother her.  Denies loss of bowel or bladder control.  Pt unsure if she has tingling or numness bilateral inner thigh.  Pt also states having Non-Hodgkins lymphoma cancer.   Pt states tinlging and numbness bilateral posterior LE going down to her feet.   Pt also states falling 3 times within the past 6 months.  Pt tripped on a stool, pt also was running and in a hurry, did not watch where she was going and tripped on a basket. Pt also fell off the bed when she was sleeping.      Patient Stated Goals  Be able to stand up straighter.     Currently in Pain?  Yes    Pain Score  9     Pain Onset  More than a month ago  PT Education - 09/20/18 1132    Education Details  ther-ex    Person(s) Educated  Patient    Methods  Explanation;Demonstration;Tactile cues;Verbal cues    Comprehension  Returned demonstration;Verbalized understanding        Objectives   Therapeutic exercise   Seated L hip flexion 10x3. Decreased L low back pain   Seated trunk extension at chair with pillow cushion 10x5 seconds for 3 sets  Decreased back pain  Reviewed and given as part of her HEP. Pt demonstrated and verbalized understanding.   Seated R hip extension isometrics 10x5 seconds for 3 sets  Decreased back pain.   Standing hip abduction 2 lbs ankle weight with B UE assist 10x3 each LE to promote glute med strengthening  Standing B shoulder extension with scapular retraction red band 10x3  Standing straight pallof press double green band 10x5 seconds for 3 sets to promote trunk strengthening  Seated L hip extension isometrics 10x5 seconds  Decreased R  low back pain  Forward step up onto 4 inch step with B UE assist   R 10x3  L 10x3   SLS with B UE assist to promote glute med muscle strengthening             R 10x5 seconds x2             L 10x5 seconds x 2   Improved exercise technique, movement at target joints, use of target muscles after mod verbal, visual, tactile cues.    Decreased L low back pain but pt felt R low back pain after session. Decreased R low back pain with activation of glute max muscles. Continued working on trunk extensor, glute med and max muscle strengthening to help promote proper posture to low back and decrease stress. Pt will benefit from continued skilled physical therapy services to decrease back pain, improve strength and function.           PT Short Term Goals - 05/16/18 1224      PT SHORT TERM GOAL #1   Title  Patient will be independent with her HEP to promote trunk and LE strength, and to help decrease back and bilateral LE pain.     Time  3    Period  Weeks    Status  On-going    Target Date  06/09/18        PT Long Term Goals - 09/20/18 1147      PT LONG TERM GOAL #1   Title  Patient will have a decrease in back pain to 5/10 or less at worst to promote ability to ambulate, peform standing tasks more comfortably.     Baseline  10/10 back pain at worst for the past 3 months. (03/24/2018); 9/10 at worst at night, 4-5/10 during the daytime (05/16/2018); Back pain at worst 6-9/10 for the past 7 days. Pain is not as bad as it usually is sometimes (06/27/2018), 6-8/10 back pain at worst (08/08/2018), 10/10 (08/23/2018); 7/10 at worst for the past 7 days other than today (09/20/2018)    Time  3    Period  Weeks    Status  On-going    Target Date  10/06/18      PT LONG TERM GOAL #2   Title  Patient will improve her lumbar spine FOTO score by at least 10 points as a demonstration of improved function.     Baseline  Lumbar spine FOTO: 37 (03/24/2018); 54 (05/16/2018)    Time  6  Period  Weeks     Status  Achieved      PT LONG TERM GOAL #3   Title  Pt will improve bilateral hip abduction and extension strength by at least 1/2 MMT grade to promote ability to perform standing tasks with less back pain.     Time  6    Period  Weeks    Status  Achieved            Plan - 09/20/18 1122    Clinical Impression Statement  Decreased L low back pain but pt felt R low back pain after session. Decreased R low back pain with activation of glute max muscles. Continued working on trunk extensor, glute med and max muscle strengthening to help promote proper posture to low back and decrease stress. Pt will benefit from continued skilled physical therapy services to decrease back pain, improve strength and function.     Rehab Potential  Fair    Clinical Impairments Affecting Rehab Potential  (-) Chronicity of back pain, B LE pain, Parkinson's, age; (+) motivated    PT Frequency  2x / week    PT Duration  6 weeks    PT Treatment/Interventions  Therapeutic activities;Therapeutic exercise;Balance training;Neuromuscular re-education;Patient/family education;Manual techniques;Dry needling;Aquatic Therapy;Electrical Stimulation;Iontophoresis 4mg /ml Dexamethasone;Gait training    PT Next Visit Plan  trunk, hip strengthening, lumbopelvic control, gait training, manual techniques, modalities PRN    Consulted and Agree with Plan of Care  Patient       Patient will benefit from skilled therapeutic intervention in order to improve the following deficits and impairments:  Pain, Improper body mechanics, Postural dysfunction, Decreased strength, Difficulty walking, Decreased safety awareness, Decreased balance, Abnormal gait  Visit Diagnosis: Radiculopathy, lumbosacral region  Chronic bilateral low back pain with bilateral sciatica  Difficulty in walking, not elsewhere classified  Muscle weakness (generalized)     Problem List Patient Active Problem List   Diagnosis Date Noted  . Numbness  03/09/2018  . Lumbar spondylosis 03/07/2018  . Chronic pain of left knee 03/07/2018  . Chronic upper back pain 12/08/2017  . Benign neoplasm of hand 10/20/2017  . Chronic pain syndrome 10/20/2017  . Chronic pain of right lower extremity 10/20/2017  . Chronic myofascial pain 10/20/2017  . Numbness and tingling 07/12/2017  . Sleep difficulties 07/12/2017  . Low back pain 01/18/2017  . Sleep behavior disorder, REM 01/18/2017  . Lumbar radiculopathy 11/01/2016  . Follicular lymphoma of extranodal and solid organ sites [facility]) 05/01/2016  . Lymphoma ([place]) 04/30/2016  . Hallux limitus 07/27/2015  . Low back pain with sciatica 02/13/2015  . Bilateral low back pain with sciatica 02/13/2015  . H/O adenomatous polyp of colon 04/06/2014  . Hx of adenomatous colonic polyps 04/06/2014  . Back ache 01/25/2014  . Adiposity 01/25/2014  . REM sleep behavior disorder 01/25/2014  . Disordered sleep 01/25/2014  . Has a tremor 01/25/2014  . Obesity, unspecified 01/25/2014  . Sleep disorder 01/25/2014  . Backache 01/25/2014  . Tremor 01/25/2014  . Obesity 01/25/2014  . Difficulty hearing 06/08/2012  . Hearing loss 06/08/2012  . Anxiety 06/06/2012  . Colon polyp 06/06/2012  . Clinical depression 06/06/2012  . Hypercholesteremia 06/06/2012  . Idiopathic Parkinson's disease ([place]) 06/06/2012  . Detached retina 06/06/2012  . Depression 06/06/2012  . Retinal detachment 06/06/2012  . Parkinson's disease ([place]) 06/06/2012    Joneen Boers PT, DPT   09/20/2018, 12:25 PM  [facility] [address]  [place], [place], [address] Phone: [phone]   Fax:  [phone]  Name: Sarah Donaldson MRN: [id number] Date of Birth: [birth year]

## 2018-09-22 ENCOUNTER — Ambulatory Visit: Payer: Medicare Other

## 2018-09-22 DIAGNOSIS — M5442 Lumbago with sciatica, left side: Secondary | ICD-10-CM | POA: Diagnosis not present

## 2018-09-22 DIAGNOSIS — M5441 Lumbago with sciatica, right side: Secondary | ICD-10-CM | POA: Diagnosis not present

## 2018-09-22 DIAGNOSIS — M6281 Muscle weakness (generalized): Secondary | ICD-10-CM | POA: Diagnosis not present

## 2018-09-22 DIAGNOSIS — R262 Difficulty in walking, not elsewhere classified: Secondary | ICD-10-CM | POA: Diagnosis not present

## 2018-09-22 DIAGNOSIS — M5417 Radiculopathy, lumbosacral region: Secondary | ICD-10-CM

## 2018-09-22 DIAGNOSIS — G8929 Other chronic pain: Secondary | ICD-10-CM

## 2018-09-22 NOTE — Therapy (Addendum)
[facility] [address], [place], [address] Phone: [phone]   Fax:  [phone]  Physical Therapy Treatment And Discharge Summary  Patient Details  Name: Sarah Donaldson MRN: [id number] Date of Birth: [birth year] Referring Provider (PT): Gurney Maxin, MD   Encounter Date: 09/22/2018  PT End of Session - 09/22/18 1605    Visit Number  35    Number of Visits  53    Date for PT Re-Evaluation  09/22/18    Authorization Type  5    Authorization Time Period  of 10 progress report    PT Start Time  1605    PT Stop Time  3500    PT Time Calculation (min)  48 min    Activity Tolerance  Patient tolerated treatment well;Patient limited by pain    Behavior During Therapy  Restless;WFL for tasks assessed/performed;Impulsive       Past Medical History:  Diagnosis Date  . Cancer ([place])    Non-hodgkin's lymphoma (in remission)  . Hyperlipidemia   . Knee joint cyst, left 02/07/2018  . Parkinson disease ([place])   . Parkinson disease [facility])     Past Surgical History:  Procedure Laterality Date  . BACK SURGERY    . BUNIONECTOMY    . NASAL SEPTUM SURGERY    . RETINAL DETACHMENT SURGERY      There were no vitals filed for this visit.  Subjective Assessment - 09/22/18 1606    Subjective  Pt states that she is in pain today. 9/10 back pain currently. Has been leaning over while working on the sewing machine which bothers her back.  Has been doing her exercises. The seated back extension on chair usually helps.  Still has back pain at night. Has a hard time picking up her legs when going to the bathroom in the middle of the night.   Parkinsons medication usually wears out around 11:30 pm.   Wants to try graduating PT today and try her exercises at home.     Pertinent History  Chronic back pain. Has had Parkinsons since she was 68 years old. Has had back pain for years. Worsened around 2 years ago and feels cramping as  well as pins and needles R medial and lateral thigh as well as L thigh.  Pt states that [name] increased last week. Difficulty bending her L leg and to kneel due to pain. Wears a knee brace to help with the swelling.   Pt states that when she walks on her L LE and steps on it, it gives way which started around the past 2 days.   Pt states that her back pain has gotten worse since 2 years ago.  Walking makes her legs feel heavy. Pt uses her SPC sporadically when her legs bother her.  Denies loss of bowel or bladder control.  Pt unsure if she has tingling or numness bilateral inner thigh.  Pt also states having Non-Hodgkins lymphoma cancer.   Pt states tinlging and numbness bilateral posterior LE going down to her feet.   Pt also states falling 3 times within the past 6 months.  Pt tripped on a stool, pt also was running and in a hurry, did not watch where she was going and tripped on a basket. Pt also fell off the bed when she was sleeping.      Patient Stated Goals  Be able to stand up straighter.     Currently in  Pain?  Yes    Pain Score  9     Pain Onset  More than a month ago                               PT Education - 09/22/18 1930    Education Details  ther-ex, HEP    Person(s) Educated  Patient;Spouse    Methods  Explanation;Demonstration;Tactile cues;Verbal cues;Handout    Comprehension  Verbalized understanding;Returned demonstration      Objectives      Therapeutic exercise   Reviewed POC: graduate from PT and try her HEP for the next 2-3 months and get another referral to start back up with PT if needed.    Seated trunk extension at chair with pillow cushion 10x5 seconds for 3 sets         Seated L hip extension isometrics 10x3 with 5 second holds  Decreased back pain  Seated R trunk side bend isometrics 10x3 with 5 seconds     Seated manually resisted clamshell isometrics 10x3 with 5 second holds  Hips less than 90 degrees flexion     Seated straight pallof press resisting double red band 10x5 seconds   Reviewed HEP with husband as well. Both pt and husband demonstrated and verbalized understanding. Handout provided.     Improved exercise technique, movement at target joints, use of target muscles after mod verbal, visual, tactile cues.    Pt demonstrates overall improved LE strength, and function since initial evaluation. Back pain levels seem to remain elevated per pt subjective reports in which exercises to help decrease L lateral lean and L trunk rotation as well as improving trunk and hip strength seem to help decrease pain. Skilled physical therapy services discharged with pt continuing progress with her exercises at home.       PT Short Term Goals - 05/16/18 1224      PT SHORT TERM GOAL #1   Title  Patient will be independent with her HEP to promote trunk and LE strength, and to help decrease back and bilateral LE pain.     Time  3    Period  Weeks    Status  On-going    Target Date  06/09/18        PT Long Term Goals - 09/20/18 1147      PT LONG TERM GOAL #1   Title  Patient will have a decrease in back pain to 5/10 or less at worst to promote ability to ambulate, peform standing tasks more comfortably.     Baseline  10/10 back pain at worst for the past 3 months. (03/24/2018); 9/10 at worst at night, 4-5/10 during the daytime (05/16/2018); Back pain at worst 6-9/10 for the past 7 days. Pain is not as bad as it usually is sometimes (06/27/2018), 6-8/10 back pain at worst (08/08/2018), 10/10 (08/23/2018); 7/10 at worst for the past 7 days other than today (09/20/2018)    Time  3    Period  Weeks    Status  On-going    Target Date  10/06/18      PT LONG TERM GOAL #2   Title  Patient will improve her lumbar spine FOTO score by at least 10 points as a demonstration of improved function.     Baseline  Lumbar spine FOTO: 37 (03/24/2018); 54 (05/16/2018)    Time  6    Period  Weeks    Status  Achieved      PT LONG TERM GOAL #3   Title  Pt will improve bilateral hip abduction and extension strength by at least 1/2 MMT grade to promote ability to perform standing tasks with less back pain.     Time  6    Period  Weeks    Status  Achieved            Plan - 09/22/18 1931    Clinical Impression Statement  Pt demonstrates overall improved LE strength, and function since initial evaluation. Back pain levels seem to remain elevated per pt subjective reports in which exercises to help decrease L lateral lean and L trunk rotation as well as improving trunk and hip strength seem to help decrease pain. Skilled physical therapy services discharged with pt continuing progress with her exercises at home.    History and Personal Factors relevant to plan of care:  Medical history, chronicity of back and LE pain, Parkinson's disease, L knee pain, weakness, difficulty walking, difficulty moving around, fall history    Clinical Presentation  Stable    Clinical Presentation due to:  Pt demonstrates improved strength and function since eval. Pain level varies based on subjective reports.    Clinical Decision Making  Low    Rehab Potential  Fair    Clinical Impairments Affecting Rehab Potential  (-) Chronicity of back pain, B LE pain, Parkinson's, age; (+) motivated    PT Treatment/Interventions  Therapeutic activities;Therapeutic exercise;Neuromuscular re-education;Patient/family education;Manual techniques    PT Next Visit Plan  continue progress with her exercises at home    Consulted and Agree with Plan of Care  Patient       Patient will benefit from skilled therapeutic intervention in order to improve the following deficits and impairments:  Pain, Improper body mechanics, Postural dysfunction, Decreased strength, Difficulty walking, Decreased safety awareness, Decreased balance, Abnormal gait  Visit Diagnosis: Radiculopathy, lumbosacral region  Chronic bilateral low back pain with  bilateral sciatica  Difficulty in walking, not elsewhere classified  Muscle weakness (generalized)     Problem List Patient Active Problem List   Diagnosis Date Noted  . Numbness 03/09/2018  . Lumbar spondylosis 03/07/2018  . Chronic pain of left knee 03/07/2018  . Chronic upper back pain 12/08/2017  . Benign neoplasm of hand 10/20/2017  . Chronic pain syndrome 10/20/2017  . Chronic pain of right lower extremity 10/20/2017  . Chronic myofascial pain 10/20/2017  . Numbness and tingling 07/12/2017  . Sleep difficulties 07/12/2017  . Low back pain 01/18/2017  . Sleep behavior disorder, REM 01/18/2017  . Lumbar radiculopathy 11/01/2016  . Follicular lymphoma of extranodal and solid organ sites [facility]) 05/01/2016  . Lymphoma ([place]) 04/30/2016  . Hallux limitus 07/27/2015  . Low back pain with sciatica 02/13/2015  . Bilateral low back pain with sciatica 02/13/2015  . H/O adenomatous polyp of colon 04/06/2014  . Hx of adenomatous colonic polyps 04/06/2014  . Back ache 01/25/2014  . Adiposity 01/25/2014  . REM sleep behavior disorder 01/25/2014  . Disordered sleep 01/25/2014  . Has a tremor 01/25/2014  . Obesity, unspecified 01/25/2014  . Sleep disorder 01/25/2014  . Backache 01/25/2014  . Tremor 01/25/2014  . Obesity 01/25/2014  . Difficulty hearing 06/08/2012  . Hearing loss 06/08/2012  . Anxiety 06/06/2012  . Colon polyp 06/06/2012  . Clinical depression 06/06/2012  . Hypercholesteremia 06/06/2012  . Idiopathic Parkinson's disease ([place]) 06/06/2012  . Detached retina 06/06/2012  . Depression 06/06/2012  . Retinal detachment 06/06/2012  .  Parkinson's disease ([place]) 06/06/2012   Thank you for your referral.  Joneen Boers PT, DPT   09/22/2018, 7:44 PM  [facility] [address], [place], [address] Phone: [phone]   Fax:  [phone]  Name: Sarah Donaldson MRN: [id number] Date of Birth:  [birth year]

## 2018-09-22 NOTE — Patient Instructions (Addendum)
   Sit in front of a mirror              Maintain an upright posture without leaning and turning to the left    Seated hip extension isometrics   Sitting on a chair against the wall.               Squeeze your rear end muscles together and press your L foot onto the floor.                Hold for 5 seconds               Repeat 10 times              Perform 2 sets daily.                 This is a corrective exercise. Once you no longer have symptoms, you can stop.    Seated clamshell isometrics  (to be performed with husband )  Sitting on your bed, your husband's hands at outside knees (neutral thigh position)  Press your thighs out against them at a pain free level of effort.   Hold for 5 seconds.   Repeat 10 times.   Perform 3 sets daily.     Seated R trunk side bend isometrics     Sitting with good posture   Husband gently pushes your right side to the left.     Keep your trunk from moving    Hold for 5 seconds     Repeat 10 times    Perform 3 sets daily       Reviewed and given seated straight pallof press resisting yellow band 10x3 with 5 second holds as part of her HEP. Pt and husband demonstrated and verbalized understanding. Handout provided.

## 2018-09-26 ENCOUNTER — Ambulatory Visit: Payer: Medicare Other

## 2018-09-28 ENCOUNTER — Ambulatory Visit: Payer: Medicare Other

## 2018-10-03 ENCOUNTER — Ambulatory Visit: Payer: Medicare Other

## 2018-10-04 ENCOUNTER — Other Ambulatory Visit: Payer: Self-pay | Admitting: Nurse Practitioner

## 2018-10-19 DIAGNOSIS — G4752 REM sleep behavior disorder: Secondary | ICD-10-CM | POA: Diagnosis not present

## 2018-10-19 DIAGNOSIS — G20 Parkinson's disease: Secondary | ICD-10-CM | POA: Diagnosis not present

## 2018-10-19 DIAGNOSIS — M5440 Lumbago with sciatica, unspecified side: Secondary | ICD-10-CM | POA: Diagnosis not present

## 2018-10-19 DIAGNOSIS — R200 Anesthesia of skin: Secondary | ICD-10-CM | POA: Diagnosis not present

## 2018-10-19 DIAGNOSIS — R202 Paresthesia of skin: Secondary | ICD-10-CM | POA: Diagnosis not present

## 2018-11-04 ENCOUNTER — Telehealth: Payer: Self-pay | Admitting: Nurse Practitioner

## 2018-11-04 NOTE — Telephone Encounter (Signed)
[name] at [facility] called asking for refill on Naproxen 500mg . Per vmail Thurs 5:04  Phone [phone]   Fax [phone]

## 2018-11-04 NOTE — Telephone Encounter (Signed)
Patient has appt April 7 for meds mgmt.

## 2018-11-07 NOTE — Telephone Encounter (Signed)
Please set her up for a VV apt. Thanks

## 2018-11-08 NOTE — Telephone Encounter (Signed)
Patient is already set up 11-15-18

## 2018-11-15 ENCOUNTER — Other Ambulatory Visit: Payer: Self-pay

## 2018-11-15 ENCOUNTER — Ambulatory Visit: Payer: Medicare Other | Attending: Nurse Practitioner | Admitting: Nurse Practitioner

## 2018-11-15 ENCOUNTER — Telehealth: Payer: Self-pay

## 2018-11-15 DIAGNOSIS — M5416 Radiculopathy, lumbar region: Secondary | ICD-10-CM

## 2018-11-15 DIAGNOSIS — G8929 Other chronic pain: Secondary | ICD-10-CM | POA: Diagnosis not present

## 2018-11-15 DIAGNOSIS — G894 Chronic pain syndrome: Secondary | ICD-10-CM

## 2018-11-15 DIAGNOSIS — M47816 Spondylosis without myelopathy or radiculopathy, lumbar region: Secondary | ICD-10-CM | POA: Diagnosis not present

## 2018-11-15 DIAGNOSIS — M7918 Myalgia, other site: Secondary | ICD-10-CM | POA: Diagnosis not present

## 2018-11-15 MED ORDER — PREGABALIN 25 MG PO CAPS: 25.0000 mg | ORAL_CAPSULE | Freq: Four times a day (QID) | ORAL | 2 refills | Status: DC

## 2018-11-15 NOTE — Progress Notes (Signed)
Pain Management Encounter Note - Virtual Visit via Telephone Telehealth (real-time audio visits between healthcare provider and patient).  Patient's Phone No. & Preferred Pharmacy:  [phone] (home); [phone] (mobile); (Preferred) [phone]  [id number] - [place], [place] - [address] [place] [address] Phone: [phone] Fax: [phone]  [facility] [place] - [address] [place] [address] Phone: [phone] Fax: [phone]  [id number] - [place], [place] - [address] [place] [address] Phone: [phone] Fax: [phone]   Pre-screening note:  Our staff contacted Sarah Donaldson and offered him an "in person", "face-to-face" appointment versus a telephone encounter. He indicated preferring the telephone encounter, at this time.  Reason for Virtual Visit: COVID-19*  Social distancing based on CDC and AMA recommendations.   I contacted Sarah Donaldson on 11/15/2018 at 11:00 AM by telephone and clearly identified myself as  , NP. I verified that I was speaking with the correct person using two identifiers (Name and date of birth: 01/06/1951).  Advanced Informed Consent I sought verbal advanced consent from Sarah Donaldson for telemedicine interactions and virtual visit. I informed Sarah Donaldson of the security and privacy concerns, risks, and limitations associated with performing an evaluation and management service by telephone. I also informed Sarah Donaldson of the availability of "in person" appointments and I informed him of the possibility of a patient responsible charge related to this service. Sarah Donaldson expressed understanding and agreed to proceed.   Historic Elements   Sarah Donaldson is a 67 y.o. year old, adult patient evaluated today after his last encounter  by our practice on 11/04/2018. Sarah Donaldson  has a past medical history of Cancer (HCC), Hyperlipidemia, Knee joint cyst, left (02/07/2018), Parkinson disease (HCC), and Parkinson disease (HCC). He also  has a past surgical history that includes Retinal detachment surgery; Bunionectomy; Nasal septum surgery; and Back surgery. Sarah Donaldson has a current medication list which includes the following prescription(s): carbidopa-levodopa, clonazepam, pramipexole, selegiline, terazosin, trazodone, acetaminophen, amoxicillin, azithromycin, carbidopa-levodopa, chlorhexidine, clinpro 5000, clonazepam, clonazepam, furosemide, furosemide, neomycin-polymyxin b-dexamethasone, omeprazole, omeprazole, pramipexole, prednisolone acetate, pregabalin, promethazine, ranitidine, ranitidine, rosuvastatin, selegiline, terazosin, trazodone, and triamcinolone cream. He  reports that he has quit smoking. He has never used smokeless tobacco. He reports current alcohol use. He reports that he does not use drugs. Sarah Donaldson is allergic to codeine sulfate and gabapentin.   HPI  I last saw him on 11/04/2018. He is being evaluated for medication management. She is rating her 4/10 for her low back pain. She states that it is 8/10 when she wakes the pain . She admits her pain is getting worse. She feels like her Parkinson is getting worse. She continues to have leg pain. She states that the medication is effective for treating her pain. She denies any new concerns.     Pharmacotherapy Assessment  Analgesic: None MME/day: 0 mg/day.   Monitoring: Pharmacotherapy: No side-effects or adverse reactions reported. [place] PMP: PDMP reviewed during this encounter.       Compliance: No problems identified. Plan: Refer to "POC".  Review of recent tests  US THYROID CLINICAL DATA:  Prior ultrasound follow-up. History of left-sided thyroid nodule fine-needle aspiration performed 11/2012.  Note, left-sided thyroid nodule has been found to be  hypermetabolic on previous PET-CT including the 05/19/2017 examination.  EXAM: THYROID ULTRASOUND    TECHNIQUE: Ultrasound examination of the thyroid gland and adjacent soft tissues was performed.  COMPARISON:  12/11/2016; 01/21/2015; 07/25/2014; 12/26/2013; 06/26/2013; ultrasound-guided left-sided thyroid nodule fine-needle aspiration-11/29/2012; PET-CT-05/19/2017  FINDINGS: Parenchymal Echotexture: Mildly heterogenous  Isthmus: Normal in size measures 0.4 cm in diameter, unchanged  Right lobe: Normal in size measuring 4.2 x 1.3 x 1.8 cm, unchanged, previously, 3.7 x 1.4 x 1.6 cm  Left lobe: Normal in size measuring 4.5 x 2.6 x 2.5 cm, unchanged, previously, 3.7 x 2.5 x 2.5 cm  _________________________________________________________  Estimated total number of nodules >/= 1 cm: 1  Number of spongiform nodules >/=  2 cm not described below (TR1): 0  Number of mixed cystic and solid nodules >/= 1.5 cm not described below (TR2): 0  _________________________________________________________  The previously biopsied approximately 2.4 x 2.0 x 1.9 cm nodule within the mid aspect the left lobe of the thyroid (labeled 1), appears similar to ultrasound-guided left-sided thyroid nodule fine-needle aspiration performed 11/29/2012, previously, 2.9 x 2.0 x 1.8 cm. Correlation with prior biopsy results is recommended.  IMPRESSION: 1. No new or enlarging thyroid nodules. 2. Previously biopsied approximately 2.4 cm nodule within the left lobe of the thyroid is unchanged to decreased in size compared to the 11/2012 examination, previously, 2.9 cm. Correlation with prior biopsy results is recommended. Assuming a benign pathologic diagnosis, repeat sampling and/or continued dedicated follow-up is not recommended. Additionally, stability for greater than 5 years is suggestive of a benign etiology.  The above is in keeping with the ACR TI-RADS recommendations - J Am Coll Radiol  2017;14:587-595.  Electronically Signed   By: Sandi Mariscal M.D.   On: 08/05/2018 16:05   Orders Only on 05/20/2018  Component Date Value Ref Range Status  . LDH 05/20/2018 137  98 - 192 U/L Final   Performed at [facility], [address] [place], [place] [address]  . Sodium 05/20/2018 144  135 - 145 mmol/L Final  . Potassium 05/20/2018 4.0  3.5 - 5.1 mmol/L Final  . Chloride 05/20/2018 109  98 - 111 mmol/L Final  . CO2 05/20/2018 28  22 - 32 mmol/L Final  . Glucose, Bld 05/20/2018 98  70 - 99 mg/dL Final  . BUN 05/20/2018 23  8 - 23 mg/dL Final  . Creatinine, Ser 05/20/2018 0.41* 0.44 - 1.00 mg/dL Final  . Calcium 05/20/2018 9.1  8.9 - 10.3 mg/dL Final  . Total Protein 05/20/2018 6.2* 6.5 - 8.1 g/dL Final  . Albumin 05/20/2018 3.8  3.5 - 5.0 g/dL Final  . AST 05/20/2018 15  15 - 41 U/L Final  . ALT 05/20/2018 <5  0 - 44 U/L Final  . Alkaline Phosphatase 05/20/2018 95  38 - 126 U/L Final  . Total Bilirubin 05/20/2018 0.5  0.3 - 1.2 mg/dL Final  . GFR calc non Af Amer 05/20/2018 >60  >60 mL/min Final  . GFR calc Af Amer 05/20/2018 >60  >60 mL/min Final   Comment: (NOTE) The eGFR has been calculated using the CKD EPI equation. This calculation has not been validated in all clinical situations. eGFR's persistently <60 mL/min signify possible Chronic Kidney Disease.   Georgiann Hahn gap 05/20/2018 7  5 - 15 Final   Performed at [facility], [address] [place], [place] [address]  . WBC 05/20/2018 5.7  4.0 - 10.5 K/uL Final  . RBC 05/20/2018 4.50  3.87 - 5.11 MIL/uL Final  . Hemoglobin 05/20/2018 12.1  12.0 - 15.0 g/dL Final  . HCT 05/20/2018  39.6  36.0 - 46.0 % Final  . MCV 05/20/2018 88.0  80.0 - 100.0 fL Final  . MCH 05/20/2018 26.9  26.0 - 34.0 pg Final  . MCHC 05/20/2018 30.6  30.0 - 36.0 g/dL Final  . RDW 05/20/2018 13.2  11.5 - 15.5 % Final  . Platelets 05/20/2018 165  150 - 400 K/uL Final  . nRBC 05/20/2018 0.0  0.0 - 0.2 % Final  . Neutrophils Relative %  05/20/2018 60  % Final  . Neutro Abs 05/20/2018 3.5  1.7 - 7.7 K/uL Final  . Lymphocytes Relative 05/20/2018 27  % Final  . Lymphs Abs 05/20/2018 1.5  0.7 - 4.0 K/uL Final  . Monocytes Relative 05/20/2018 9  % Final  . Monocytes Absolute 05/20/2018 0.5  0.1 - 1.0 K/uL Final  . Eosinophils Relative 05/20/2018 3  % Final  . Eosinophils Absolute 05/20/2018 0.2  0.0 - 0.5 K/uL Final  . Basophils Relative 05/20/2018 1  % Final  . Basophils Absolute 05/20/2018 0.0  0.0 - 0.1 K/uL Final  . Immature Granulocytes 05/20/2018 0  % Final  . Abs Immature Granulocytes 05/20/2018 0.02  0.00 - 0.07 K/uL Final   Performed at [facility], [address] , [place] [address]   Assessment  The primary encounter diagnosis was Lumbar spondylosis. Diagnoses of Chronic myofascial pain, Lumbar radiculopathy, and Chronic pain syndrome were also pertinent to this visit.  Plan of Care  I am having Sarah Donaldson maintain his pramipexole, selegiline, traZODone, furosemide, promethazine, ranitidine, prednisoLONE acetate, omeprazole, acetaminophen, clonazePAM, neomycin-polymyxin b-dexamethasone, azithromycin, rosuvastatin, terazosin, triamcinolone cream, clonazePAM, furosemide, pramipexole, selegiline, traZODone, amoxicillin, carbidopa-levodopa, carbidopa-levodopa, chlorhexidine, clonazepam, omeprazole, ranitidine, Clinpro 5000, terazosin, and pregabalin.  Pharmacotherapy (Medications Ordered): Meds ordered this encounter  Medications  . pregabalin (LYRICA) 25 MG capsule    Sig: Take 1 capsule (25 mg total) by mouth 4 (four) times daily.    Dispense:  120 capsule    Refill:  2    Do not place this medication, or any other prescription from our practice, on "Automatic Refill". Patient may have prescription filled one day early if pharmacy is closed on scheduled refill date.    Order Specific Question:   Supervising Provider    Answer:   NAVEIRA, FRANCISCO [[id number]]   Orders:  No orders of the  defined types were placed in this encounter.  Follow-up plan:   Return in about 3 months (around 02/14/2019) for MedMgmt.   I discussed the assessment and treatment plan with the patient. The patient was provided an opportunity to ask questions and all were answered. The patient agreed with the plan and demonstrated an understanding of the instructions.  Patient advised to call back or seek an in-person evaluation if the symptoms or condition worsens.  Total duration of non-face-to-face encounter: 13 minutes.  Note by:  , NP Date: 11/15/2018; Time: 11:33 AM  Disclaimer:  * Given the special circumstances of the COVID-19 pandemic, the federal government has announced that the Office for Civil Rights (OCR) will exercise its enforcement discretion and will not impose penalties on physicians using telehealth in the event of noncompliance with regulatory requirements under the Health Insurance Portability and Accountability Act (HIPAA) in connection with the good faith provision of telehealth during the COVID-19 national public health emergency. (AMA) 

## 2018-11-15 NOTE — Telephone Encounter (Signed)
Mr. Deleo wants to know if Dr. Holley Raring will update the handicap sticker for his wife.  He wants [place] to call and let him know and he will pick it up

## 2018-11-18 ENCOUNTER — Other Ambulatory Visit: Payer: Medicare Other

## 2018-11-18 ENCOUNTER — Ambulatory Visit: Payer: Medicare Other | Admitting: Internal Medicine

## 2018-11-18 ENCOUNTER — Encounter: Payer: Self-pay | Admitting: *Deleted

## 2018-11-22 ENCOUNTER — Other Ambulatory Visit: Payer: Self-pay

## 2018-11-23 ENCOUNTER — Inpatient Hospital Stay (HOSPITAL_BASED_OUTPATIENT_CLINIC_OR_DEPARTMENT_OTHER): Payer: Medicare Other | Admitting: Internal Medicine

## 2018-11-23 ENCOUNTER — Other Ambulatory Visit: Payer: Self-pay | Admitting: *Deleted

## 2018-11-23 ENCOUNTER — Other Ambulatory Visit: Payer: Self-pay

## 2018-11-23 ENCOUNTER — Inpatient Hospital Stay: Payer: Medicare Other | Attending: Internal Medicine

## 2018-11-23 ENCOUNTER — Telehealth: Payer: Self-pay | Admitting: *Deleted

## 2018-11-23 ENCOUNTER — Emergency Department
Admission: EM | Admit: 2018-11-23 | Discharge: 2018-11-23 | Disposition: A | Discharge status: Home / Self Care | Payer: Medicare Other | Attending: Emergency Medicine | Admitting: Emergency Medicine

## 2018-11-23 ENCOUNTER — Encounter: Payer: Self-pay | Admitting: Internal Medicine

## 2018-11-23 DIAGNOSIS — T446X5A Adverse effect of alpha-adrenoreceptor antagonists, initial encounter: Secondary | ICD-10-CM | POA: Insufficient documentation

## 2018-11-23 DIAGNOSIS — Z79899 Other long term (current) drug therapy: Secondary | ICD-10-CM | POA: Insufficient documentation

## 2018-11-23 DIAGNOSIS — D1723 Benign lipomatous neoplasm of skin and subcutaneous tissue of right leg: Secondary | ICD-10-CM | POA: Insufficient documentation

## 2018-11-23 DIAGNOSIS — C8299 Follicular lymphoma, unspecified, extranodal and solid organ sites: Secondary | ICD-10-CM

## 2018-11-23 DIAGNOSIS — T7840XA Allergy, unspecified, initial encounter: Secondary | ICD-10-CM | POA: Diagnosis present

## 2018-11-23 DIAGNOSIS — R161 Splenomegaly, not elsewhere classified: Secondary | ICD-10-CM | POA: Insufficient documentation

## 2018-11-23 DIAGNOSIS — M25569 Pain in unspecified knee: Secondary | ICD-10-CM | POA: Diagnosis not present

## 2018-11-23 DIAGNOSIS — R251 Tremor, unspecified: Secondary | ICD-10-CM | POA: Diagnosis not present

## 2018-11-23 DIAGNOSIS — G8929 Other chronic pain: Secondary | ICD-10-CM | POA: Diagnosis not present

## 2018-11-23 DIAGNOSIS — Z87891 Personal history of nicotine dependence: Secondary | ICD-10-CM | POA: Insufficient documentation

## 2018-11-23 DIAGNOSIS — E041 Nontoxic single thyroid nodule: Secondary | ICD-10-CM | POA: Diagnosis not present

## 2018-11-23 DIAGNOSIS — Z9225 Personal history of immunosupression therapy: Secondary | ICD-10-CM | POA: Diagnosis not present

## 2018-11-23 DIAGNOSIS — G20 Parkinson's disease: Secondary | ICD-10-CM | POA: Insufficient documentation

## 2018-11-23 DIAGNOSIS — M545 Low back pain: Secondary | ICD-10-CM | POA: Diagnosis not present

## 2018-11-23 DIAGNOSIS — T887XXA Unspecified adverse effect of drug or medicament, initial encounter: Secondary | ICD-10-CM

## 2018-11-23 DIAGNOSIS — R21 Rash and other nonspecific skin eruption: Secondary | ICD-10-CM | POA: Insufficient documentation

## 2018-11-23 DIAGNOSIS — M7120 Synovial cyst of popliteal space [Baker], unspecified knee: Secondary | ICD-10-CM | POA: Insufficient documentation

## 2018-11-23 DIAGNOSIS — E876 Hypokalemia: Secondary | ICD-10-CM

## 2018-11-23 LAB — CBC WITH DIFFERENTIAL/PLATELET
Abs Immature Granulocytes: 0.02 10*3/uL (ref 0.00–0.07)
Basophils Absolute: 0 10*3/uL (ref 0.0–0.1)
Basophils Relative: 0 %
Eosinophils Absolute: 0.2 10*3/uL (ref 0.0–0.5)
Eosinophils Relative: 3 %
HCT: 44.5 % (ref 36.0–46.0)
Hemoglobin: 13.9 g/dL (ref 12.0–15.0)
Immature Granulocytes: 0 %
Lymphocytes Relative: 34 %
Lymphs Abs: 2.5 10*3/uL (ref 0.7–4.0)
MCH: 26.9 pg (ref 26.0–34.0)
MCHC: 31.2 g/dL (ref 30.0–36.0)
MCV: 86.1 fL (ref 80.0–100.0)
Monocytes Absolute: 0.6 10*3/uL (ref 0.1–1.0)
Monocytes Relative: 8 %
Neutro Abs: 4 10*3/uL (ref 1.7–7.7)
Neutrophils Relative %: 55 %
Platelets: 190 10*3/uL (ref 150–400)
RBC: 5.17 MIL/uL — ABNORMAL HIGH (ref 3.87–5.11)
RDW: 14.6 % (ref 11.5–15.5)
WBC: 7.2 10*3/uL (ref 4.0–10.5)
nRBC: 0 % (ref 0.0–0.2)

## 2018-11-23 LAB — BASIC METABOLIC PANEL
Anion gap: 8 (ref 5–15)
BUN: 33 mg/dL — ABNORMAL HIGH (ref 8–23)
CO2: 26 mmol/L (ref 22–32)
Calcium: 9.1 mg/dL (ref 8.9–10.3)
Chloride: 108 mmol/L (ref 98–111)
Creatinine, Ser: 0.59 mg/dL (ref 0.44–1.00)
GFR calc Af Amer: >60 mL/min (ref >60)
GFR calc non Af Amer: >60 mL/min (ref >60)
Glucose, Bld: 109 mg/dL — ABNORMAL HIGH (ref 70–99)
Potassium: 3.4 mmol/L — ABNORMAL LOW (ref 3.5–5.1)
Sodium: 142 mmol/L (ref 135–145)

## 2018-11-23 LAB — CK: Total CK: 76 U/L (ref 38–234)

## 2018-11-23 LAB — LACTATE DEHYDROGENASE: LDH: 153 U/L (ref 98–192)

## 2018-11-23 MED ORDER — LORAZEPAM 2 MG/ML IJ SOLN
0.5000 mg | Freq: Once | INTRAMUSCULAR | Status: AC
  Administered 2018-11-23: 0.5 mg via INTRAVENOUS
  Filled 2018-11-23: qty 1

## 2018-11-23 MED ORDER — SODIUM CHLORIDE 0.9 % IV BOLUS
500.0000 mL | Freq: Once | INTRAVENOUS | Status: AC
  Administered 2018-11-23: 500 mL via INTRAVENOUS

## 2018-11-23 MED ORDER — KETOROLAC TROMETHAMINE 30 MG/ML IJ SOLN
15.0000 mg | Freq: Once | INTRAMUSCULAR | Status: AC
  Administered 2018-11-23: 07:00:00 15 mg via INTRAVENOUS
  Filled 2018-11-23: qty 1

## 2018-11-23 MED ORDER — LORAZEPAM 2 MG/ML IJ SOLN
0.5000 mg | Freq: Once | INTRAMUSCULAR | Status: AC
  Administered 2018-11-23: 06:00:00 0.5 mg via INTRAVENOUS
  Filled 2018-11-23: qty 1

## 2018-11-23 MED ORDER — POTASSIUM CHLORIDE CRYS ER 20 MEQ PO TBCR
20.0000 meq | EXTENDED_RELEASE_TABLET | Freq: Once | ORAL | Status: AC
  Administered 2018-11-23: 07:00:00 20 meq via ORAL
  Filled 2018-11-23: qty 1

## 2018-11-23 NOTE — Telephone Encounter (Signed)
Left vm for patient- regarding to initiate her telehealth appointment today.

## 2018-11-23 NOTE — Discharge Instructions (Addendum)
Do not stop Terazosin cold [place]. Go back to the 1mg  dose and speak with your neurologist.  Return to the ER for worsening symptoms, persistent vomiting, difficulty breathing or other concerns.

## 2018-11-23 NOTE — Telephone Encounter (Signed)
2nd attempt to contact the patient for telehealth visit successful. MD informed to contact patient via pt's husband cell #. For face to face visit.

## 2018-11-23 NOTE — Assessment & Plan Note (Addendum)
#  Stage IV low-grade follicular lymphoma; involving the spleen. Rituxan maintenance last December 2015.  Currently on surveillance.  #Clinically stable..October 2019 - PET scan negative for any recurrence.   # mild splenomegaly-stable clinically  # Left thyroid nodule status post previous biopsy- followed by Dr.McQueen.  Clinically stable.  # s/p back surgery/ Chronic pain right hip low back/sciatica.stable  # Right thigh lipoma-stable  #  DISPOSITION:  # follow up in 6 months-MD/labs-CBC/CMP/LDH; PET scan prior.-Dr.B

## 2018-11-23 NOTE — Progress Notes (Signed)
I connected with Sarah Donaldson on 11/23/18 at  1:00 PM EDT by telephone visit and verified that I am speaking with the correct person using two identifiers.  I discussed the limitations, risks, security and privacy concerns of performing an evaluation and management service by telemedicine and the availability of in-person appointments. I also discussed with the patient that there may be a patient responsible charge related to this service. The patient expressed understanding and agreed to proceed.    Other persons participating in the visit and their role in the encounter: Has been Patient's location: Home Provider's location: Office  Oncology History   1. Superficial Protidectomy (right side) December 17/2013.  Consistent with low-grade CD10 and [id number] to follicular lymphoma grade 1-2.  Is positive for CD19 and cough of light chain.bone marrow aspiration and biopsy is negative .  PET scan was positive for some increased uptake in the spleen; Stage IVA disease PET scan is consistent with bilateral neck lymph node (December, 2013) 2. Weekly rituxan has been started.  August 24, 2012 3. Patient finished the last dose of Rituxan on September 14 2012  #  maintenance rituximab since April of 2014 patient has finished rituximabin December of 2015  # PET MARCH 2017 -Splenic recurrence; CT images stable. 4th October 2017- PET - NED.   # chronic back pain/PN/ Parkinsons [Dr.Potter] -----------------------------------------------   DIAGNOSIS: FOLLICULAR LYMPHOMA  STAGE:  IV       ;GOALS: control  CURRENT/MOST RECENT THERAPY: surveillaince      Follicular lymphoma of extranodal and solid organ sites [facility])     Chief Complaint: Follicle lymphoma   History of present illness:Sarah Donaldson 68 y.o.  adult with history of low-grade follicle lymphoma currently on surveillance.  Patient was recently seen in the emergency room for tremors/which was attributed to have her Parkinson's  disease medication.  Currently improved.  No night sweats no new lumps or bumps.  Complains of knee pain because of a Baker's cyst.  Observation/objective: Hemoglobin platelets within normal limits.  LDH normal  Assessment and plan: Follicular lymphoma of extranodal and solid organ sites [facility]) #Stage IV low-grade follicular lymphoma; involving the spleen. Rituxan maintenance last December 2015.  Currently on surveillance.  Stable -October 2019 - PET scan negative for any recurrence.   # mild splenomegaly-stable clinically  # Left thyroid nodule status post previous biopsy- followed by Dr.McQueen.  Clinically stable.  # s/p back surgery/ Chronic pain right hip low back/sciatica.stable  # Right thigh lipoma-stable  #  DISPOSITION:  # follow up in 6 months-MD/labs-CBC/CMP/LDH; PET scan prior.-Dr.B     Follow-up instructions:  I discussed the assessment and treatment plan with the patient.  The patient was provided an opportunity to ask questions and all were answered.  The patient agreed with the plan and demonstrated understanding of instructions.  The patient was advised to call back or seek an in person evaluation if the symptoms worsen or if the condition fails to improve as anticipated.  I provided 12 minutes of non face-to-face telephone visit time during this encounter, and > 50% was spent counseling as documented under my assessment & plan.   Dr. Charlaine Dalton [facility] 11/23/2018 1:22 PM

## 2018-11-23 NOTE — ED Triage Notes (Signed)
Pt was started on Terazosin for parkinson's recently and now has pain to legs. Also has increased shaking and co mouth swelling.

## 2018-11-23 NOTE — ED Provider Notes (Signed)
[facility] Emergency Department Provider Note   ____________________________________________   First MD Initiated Contact with Patient 11/23/18 [phone]     (approximate)  I have reviewed the triage vital signs and the nursing notes.   HISTORY  Chief Complaint Allergic Reaction    HPI Sarah Donaldson is a 68 y.o. adult who presents to the ED from home with a chief complaint of leg tremors.  Patient has a history of Parkinson's disease, sees Dr. Melrose Nakayama from neurology.  She was restarted on Terazosin 4 weeks ago with an increased dosage this week.  Began to have an increase in her lower leg tremors.  States this happened previously but her neurologist wanted to retry her on this medicine.  Also states she is having an itchy rash.  Feels like her mouth is swelling.  Denies recent fever, cough, chest pain, shortness breath, abdominal pain, nausea or vomiting.  Denies recent travel, trauma or exposure to persons diagnosed with coronavirus.       Past Medical History:  Diagnosis Date  . Cancer ([place])    Non-hodgkin's lymphoma (in remission)  . Hyperlipidemia   . Knee joint cyst, left 02/07/2018  . Parkinson disease ([place])   . Parkinson disease [facility])     Patient Active Problem List   Diagnosis Date Noted  . Numbness 03/09/2018  . Lumbar spondylosis 03/07/2018  . Chronic pain of left knee 03/07/2018  . Chronic upper back pain 12/08/2017  . Benign neoplasm of hand 10/20/2017  . Chronic pain syndrome 10/20/2017  . Chronic pain of right lower extremity 10/20/2017  . Chronic myofascial pain 10/20/2017  . Numbness and tingling 07/12/2017  . Sleep difficulties 07/12/2017  . Low back pain 01/18/2017  . Sleep behavior disorder, REM 01/18/2017  . Lumbar radiculopathy 11/01/2016  . Follicular lymphoma of extranodal and solid organ sites [facility]) 05/01/2016  . Lymphoma ([place]) 04/30/2016  . Hallux limitus 07/27/2015  . Low back pain with sciatica 02/13/2015  .  Bilateral low back pain with sciatica 02/13/2015  . H/O adenomatous polyp of colon 04/06/2014  . Hx of adenomatous colonic polyps 04/06/2014  . Back ache 01/25/2014  . Adiposity 01/25/2014  . REM sleep behavior disorder 01/25/2014  . Disordered sleep 01/25/2014  . Has a tremor 01/25/2014  . Obesity, unspecified 01/25/2014  . Sleep disorder 01/25/2014  . Backache 01/25/2014  . Tremor 01/25/2014  . Obesity 01/25/2014  . Difficulty hearing 06/08/2012  . Hearing loss 06/08/2012  . Anxiety 06/06/2012  . Colon polyp 06/06/2012  . Clinical depression 06/06/2012  . Hypercholesteremia 06/06/2012  . Idiopathic Parkinson's disease ([place]) 06/06/2012  . Detached retina 06/06/2012  . Depression 06/06/2012  . Retinal detachment 06/06/2012  . Parkinson's disease ([place]) 06/06/2012    Past Surgical History:  Procedure Laterality Date  . BACK SURGERY    . BUNIONECTOMY    . NASAL SEPTUM SURGERY    . RETINAL DETACHMENT SURGERY      Prior to Admission medications   Medication Sig Start Date End Date Taking? Authorizing Provider  acetaminophen (TYLENOL) 650 MG CR tablet Take 650 mg by mouth 2 (two) times daily.    [provider]  amoxicillin (AMOXIL) 500 MG capsule  08/18/18   [provider]  azithromycin (ZITHROMAX) 250 MG tablet azithromycin 250 mg tablet    [provider]  carbidopa-levodopa (SINEMET IR) 25-100 MG tablet Take by mouth. 07/27/18 01/22/19  [provider]  carbidopa-levodopa (SINEMET IR) 25-100 MG tablet TAKE 2 TABLETS BY MOUTH  5 (FIVE) TIMES DAILY FOR 90 DAYS 11/08/18   [provider]  chlorhexidine (PERIDEX) 0.12 % solution  08/18/18   [provider]  CLINPRO 5000 1.1 % PSTE  08/18/18   [provider]  clonazepam (KLONOPIN) 2 MG disintegrating tablet  10/22/18   [provider]  clonazePAM (KLONOPIN) 2 MG tablet Take 1-3 mg by mouth See admin instructions. 1 mg every morning and 3 mg at bedtime    [provider]  clonazePAM (KLONOPIN) 2 MG tablet Take 0.5 tab in am and 1 tab in pm 11/03/18   [provider]  furosemide (LASIX) 20 MG tablet  11/06/18   [provider]  furosemide (LASIX) 40 MG tablet Take 20 mg by mouth 2 (two) times a week. Every Sunday and every Wednesday/Thursday    [provider]  neomycin-polymyxin b-dexamethasone (MAXITROL) 9.9-24268-3.4 OINT 1 application.    [provider]  omeprazole (PRILOSEC) 20 MG capsule Take 1 capsule (20 mg total) by mouth daily. 03/07/18 06/05/18  Vevelyn Francois, NP  omeprazole (PRILOSEC) 20 MG capsule Take by mouth daily. 11/06/18   [provider]  pramipexole (MIRAPEX) 1.5 MG tablet Take 1.5 mg by mouth 3 (three) times daily. 08/06/14   [provider]  pramipexole (MIRAPEX) 1.5 MG tablet Take by mouth. 08/29/18   [provider]  prednisoLONE acetate (PRED FORTE) 1 % ophthalmic suspension Place 1 drop into the left eye daily. 10/07/17   [provider]  pregabalin (LYRICA) 25 MG capsule Take 1 capsule (25 mg total) by mouth 4 (four) times daily. 11/21/18 02/19/19  Vevelyn Francois, NP  promethazine (PHENERGAN) 12.5 MG tablet Take 12.5 mg by mouth every 6 (six) hours as needed for nausea or vomiting.    [provider]  ranitidine (ZANTAC) 150 MG capsule Take 150 mg by mouth daily.     [provider]  ranitidine (ZANTAC) 150 MG tablet  08/08/18   [provider]  rosuvastatin (CRESTOR) 10 MG tablet Take by mouth. 06/14/18   [provider]  selegiline (ELDEPRYL) 5 MG capsule Take 5 mg by mouth 2 (two) times daily with breakfast and lunch.     [provider]  selegiline (ELDEPRYL) 5 MG capsule Take by mouth. 10/04/18   [provider]  terazosin (HYTRIN) 1 MG capsule TAKE 1 CAPSULE BY MOUTH DAILY ENDING AT 4MG  DAILY AS DIRECTED BY IN DEPTH INSTRUCTIONS GIVEN AT APPT 11/08/18   [provider]  terazosin (HYTRIN) 1  MG capsule Follow in depth instructions given at appointment, start at 1 mg daily ending at 4 mg daily 10/20/18   [provider]  traZODone (DESYREL) 50 MG tablet Take 50-100 mg by mouth at bedtime as needed for sleep.     [provider]  traZODone (DESYREL) 50 MG tablet TAKE ONE AND A HALF TABLETS AT BEDTIME FOR SLEEP. TAKE 1/2 TABLET IF YOU WAKE DURING THE NIGHT. 10/04/18   [provider]  triamcinolone cream (KENALOG) 0.1 % APPLY TO AFFECTED AREA (BITES) TWICE A DAY FOR 2 WEEKS. AVOID FACE/GROIN/ARMPIT 09/12/18   [provider]    Allergies Codeine sulfate and Gabapentin  Family History  Problem Relation Age of Onset  . Heart disease Mother   . Breast cancer Neg Hx     Social History Social History   Tobacco Use  . Smoking status: Former Research scientist (life sciences)  . Smokeless tobacco: Never Used  Substance Use Topics  . Alcohol use: Yes  Alcohol/week: 0.0 standard drinks  . Drug use: No    Review of Systems  Constitutional: No fever/chills Eyes: No visual changes. ENT: No sore throat. Cardiovascular: Denies chest pain. Respiratory: Denies shortness of breath. Gastrointestinal: No abdominal pain.  No nausea, no vomiting.  No diarrhea.  No constipation. Genitourinary: Negative for dysuria. Musculoskeletal: Positive for BLE tremors.  Negative for back pain. Skin: Negative for rash. Neurological: Negative for headaches, focal weakness or numbness.   ____________________________________________   PHYSICAL EXAM:  VITAL SIGNS: ED Triage Vitals  Enc Vitals Group     BP 11/23/18 0520 (!) 177/97     Pulse Rate 11/23/18 0520 (!) 108     Resp 11/23/18 0520 20     Temp 11/23/18 0520 98.5 F (36.9 C)     Temp Source 11/23/18 0520 Oral     SpO2 11/23/18 0520 100 %     Weight 11/23/18 0519 160 lb (72.6 kg)     Height 11/23/18 0519 5\' 6"  (1.676 m)     Head Circumference --      Peak Flow --      Pain Score 11/23/18 0519 10     Pain Loc --      Pain  Edu? --      Excl. in [place]? --     Constitutional: Alert and oriented.  Frail appearing and in mild acute distress. Eyes: Conjunctivae are normal. PERRL. EOMI. Head: Atraumatic. Nose: No congestion/rhinnorhea. Mouth/Throat: Mucous membranes are moist.  No tongue or lip angioedema.  Normal phonation.  There is no hoarse or muffled voice.  There is no drooling.  Patient tolerating secretions well.  Oropharynx non-erythematous. Neck: No stridor.   Cardiovascular: Normal rate, regular rhythm. Grossly normal heart sounds.  Good peripheral circulation. Respiratory: Normal respiratory effort.  No retractions. Lungs CTAB.  No wheezing.   Gastrointestinal: Soft and nontender to light and deep palpation. No distention. No abdominal bruits. No CVA tenderness. Musculoskeletal: No lower extremity tenderness nor edema.  No joint effusions. Neurologic:  Normal speech and language. No gross focal neurologic deficits are appreciated.  Skin:  Skin is warm, dry and intact. No rash noted.  No urticaria. Psychiatric: Mood and affect are normal. Speech and behavior are normal.  ____________________________________________   LABS (all labs ordered are listed, but only abnormal results are displayed)  Labs Reviewed  CBC WITH DIFFERENTIAL/PLATELET - Abnormal; Notable for the following components:      Result Value   RBC 5.17 (*)    All other components within normal limits  BASIC METABOLIC PANEL - Abnormal; Notable for the following components:   Potassium 3.4 (*)    Glucose, Bld 109 (*)    BUN 33 (*)    All other components within normal limits  CK   ____________________________________________  EKG  None ____________________________________________  RADIOLOGY  ED MD interpretation:  None  Official radiology report(s): No results found.  ____________________________________________   PROCEDURES  Procedure(s) performed (including Critical Care):  Procedures    ____________________________________________   INITIAL IMPRESSION / ASSESSMENT AND PLAN / ED COURSE  As part of my medical decision making, I reviewed the following data within the [place] notes reviewed and incorporated, Labs reviewed, Old chart reviewed and Notes from prior ED visits        68 year old female with Parkinson's disease who presents with increased leg tremors.  Doubled her dosage of Terazosin 3 days ago.  Now with increased bilateral lower leg tremors.  No urticaria or angioedema  to suggest anaphylaxis.  Will check basic lab work and administer low-dose IV benzodiazepine for tremors and patient's anxiety.   Clinical Course as of Nov 22 648  Wed Nov 23, 2018  0645 Updated patient on lab results.  Legs are still not shaking when I entered the room.  During our conversation patient's legs began to shake again and she is complaining of pain.  Will administer IV Toradol as she usually takes Naprosyn for pain.  Will replete potassium.  Anticipate patient will be able to be discharged charge home.  Strict return precautions given.  Patient verbalizes understanding and agrees with plan of care.   [JS]    Clinical Course User Index [JS] Paulette Blanch, MD     ____________________________________________   FINAL CLINICAL IMPRESSION(S) / ED DIAGNOSES  Final diagnoses:  Side effect of medication  Tremor  Hypokalemia     ED Discharge Orders    None       Note:  This document was prepared using Dragon voice recognition software and may include unintentional dictation errors.   Paulette Blanch, MD 11/23/18 [phone]

## 2018-11-28 DIAGNOSIS — M1712 Unilateral primary osteoarthritis, left knee: Secondary | ICD-10-CM | POA: Diagnosis not present

## 2018-12-06 ENCOUNTER — Telehealth: Payer: Self-pay | Admitting: Nurse Practitioner

## 2018-12-06 DIAGNOSIS — M792 Neuralgia and neuritis, unspecified: Secondary | ICD-10-CM | POA: Diagnosis not present

## 2018-12-06 DIAGNOSIS — M7052 Other bursitis of knee, left knee: Secondary | ICD-10-CM | POA: Diagnosis not present

## 2018-12-06 DIAGNOSIS — E785 Hyperlipidemia, unspecified: Secondary | ICD-10-CM | POA: Diagnosis not present

## 2018-12-06 DIAGNOSIS — M7051 Other bursitis of knee, right knee: Secondary | ICD-10-CM | POA: Diagnosis not present

## 2018-12-06 NOTE — Telephone Encounter (Signed)
Pts husband called and stated that the pharmacy has been trying to get in touch with [place] for a refill of pts lyrica.

## 2018-12-06 NOTE — Telephone Encounter (Signed)
Spoke with [facility] and based on last fill date patient can't fill Lyrica until 12/08/18 based last fill of 11/11/18.  There is an Rx there for Lyrica with 2 refills.  Notified patient's husband to let him know this information.

## 2018-12-15 DIAGNOSIS — M1712 Unilateral primary osteoarthritis, left knee: Secondary | ICD-10-CM | POA: Diagnosis not present

## 2018-12-28 DIAGNOSIS — G20 Parkinson's disease: Secondary | ICD-10-CM | POA: Diagnosis not present

## 2018-12-28 DIAGNOSIS — M5440 Lumbago with sciatica, unspecified side: Secondary | ICD-10-CM | POA: Diagnosis not present

## 2018-12-28 DIAGNOSIS — R200 Anesthesia of skin: Secondary | ICD-10-CM | POA: Diagnosis not present

## 2018-12-28 DIAGNOSIS — G4752 REM sleep behavior disorder: Secondary | ICD-10-CM | POA: Diagnosis not present

## 2018-12-30 ENCOUNTER — Telehealth: Payer: Self-pay | Admitting: *Deleted

## 2019-01-05 ENCOUNTER — Other Ambulatory Visit: Payer: Self-pay

## 2019-01-05 ENCOUNTER — Encounter: Payer: Self-pay | Admitting: Emergency Medicine

## 2019-01-05 ENCOUNTER — Emergency Department: Payer: Medicare Other

## 2019-01-05 ENCOUNTER — Emergency Department
Admission: EM | Admit: 2019-01-05 | Discharge: 2019-01-06 | Disposition: A | Discharge status: Home / Self Care | Payer: Medicare Other | Attending: Emergency Medicine | Admitting: Emergency Medicine

## 2019-01-05 DIAGNOSIS — R251 Tremor, unspecified: Secondary | ICD-10-CM | POA: Diagnosis not present

## 2019-01-05 DIAGNOSIS — K219 Gastro-esophageal reflux disease without esophagitis: Secondary | ICD-10-CM | POA: Diagnosis not present

## 2019-01-05 DIAGNOSIS — Z20828 Contact with and (suspected) exposure to other viral communicable diseases: Secondary | ICD-10-CM | POA: Diagnosis not present

## 2019-01-05 DIAGNOSIS — G20 Parkinson's disease: Secondary | ICD-10-CM | POA: Insufficient documentation

## 2019-01-05 DIAGNOSIS — M25562 Pain in left knee: Secondary | ICD-10-CM | POA: Diagnosis not present

## 2019-01-05 DIAGNOSIS — M792 Neuralgia and neuritis, unspecified: Secondary | ICD-10-CM | POA: Diagnosis not present

## 2019-01-05 DIAGNOSIS — M7122 Synovial cyst of popliteal space [Baker], left knee: Secondary | ICD-10-CM | POA: Diagnosis not present

## 2019-01-05 DIAGNOSIS — Z87891 Personal history of nicotine dependence: Secondary | ICD-10-CM | POA: Diagnosis not present

## 2019-01-05 DIAGNOSIS — R0602 Shortness of breath: Secondary | ICD-10-CM

## 2019-01-05 DIAGNOSIS — Z79899 Other long term (current) drug therapy: Secondary | ICD-10-CM | POA: Insufficient documentation

## 2019-01-05 DIAGNOSIS — M79662 Pain in left lower leg: Secondary | ICD-10-CM | POA: Diagnosis not present

## 2019-01-05 DIAGNOSIS — Z1159 Encounter for screening for other viral diseases: Secondary | ICD-10-CM | POA: Diagnosis not present

## 2019-01-05 DIAGNOSIS — Z8572 Personal history of non-Hodgkin lymphomas: Secondary | ICD-10-CM | POA: Diagnosis not present

## 2019-01-05 LAB — CBC WITH DIFFERENTIAL/PLATELET
Abs Immature Granulocytes: 0.02 10*3/uL (ref 0.00–0.07)
Basophils Absolute: 0 10*3/uL (ref 0.0–0.1)
Basophils Relative: 0 %
Eosinophils Absolute: 0.1 10*3/uL (ref 0.0–0.5)
Eosinophils Relative: 1 %
HCT: 39.4 % (ref 36.0–46.0)
Hemoglobin: 12.5 g/dL (ref 12.0–15.0)
Immature Granulocytes: 0 %
Lymphocytes Relative: 23 %
Lymphs Abs: 1.3 10*3/uL (ref 0.7–4.0)
MCH: 27.2 pg (ref 26.0–34.0)
MCHC: 31.7 g/dL (ref 30.0–36.0)
MCV: 85.7 fL (ref 80.0–100.0)
Monocytes Absolute: 0.5 10*3/uL (ref 0.1–1.0)
Monocytes Relative: 8 %
Neutro Abs: 3.9 10*3/uL (ref 1.7–7.7)
Neutrophils Relative %: 68 %
Platelets: 196 10*3/uL (ref 150–400)
RBC: 4.6 MIL/uL (ref 3.87–5.11)
RDW: 14.6 % (ref 11.5–15.5)
WBC: 5.8 10*3/uL (ref 4.0–10.5)
nRBC: 0 % (ref 0.0–0.2)

## 2019-01-05 LAB — COMPREHENSIVE METABOLIC PANEL
ALT: 10 U/L (ref 0–44)
AST: 20 U/L (ref 15–41)
Albumin: 3.8 g/dL (ref 3.5–5.0)
Alkaline Phosphatase: 94 U/L (ref 38–126)
Anion gap: 8 (ref 5–15)
BUN: 30 mg/dL — ABNORMAL HIGH (ref 8–23)
CO2: 24 mmol/L (ref 22–32)
Calcium: 8.9 mg/dL (ref 8.9–10.3)
Chloride: 110 mmol/L (ref 98–111)
Creatinine, Ser: 0.42 mg/dL — ABNORMAL LOW (ref 0.44–1.00)
GFR calc Af Amer: >60 mL/min (ref >60)
GFR calc non Af Amer: >60 mL/min (ref >60)
Glucose, Bld: 109 mg/dL — ABNORMAL HIGH (ref 70–99)
Potassium: 3.4 mmol/L — ABNORMAL LOW (ref 3.5–5.1)
Sodium: 142 mmol/L (ref 135–145)
Total Bilirubin: 0.6 mg/dL (ref 0.3–1.2)
Total Protein: 6.5 g/dL (ref 6.5–8.1)

## 2019-01-05 LAB — BRAIN NATRIURETIC PEPTIDE: B Natriuretic Peptide: 55 pg/mL (ref 0.0–100.0)

## 2019-01-05 LAB — SARS CORONAVIRUS 2 BY RT PCR (HOSPITAL ORDER, PERFORMED IN ~~LOC~~ HOSPITAL LAB): SARS Coronavirus 2: NEGATIVE

## 2019-01-05 LAB — TROPONIN I: Troponin I: <0.03 ng/mL (ref <0.03)

## 2019-01-05 NOTE — ED Provider Notes (Signed)
[facility] Emergency Department Provider Note   ____________________________________________   First MD Initiated Contact with Patient 01/05/19 2320     (approximate)  I have reviewed the triage vital signs and the nursing notes.   HISTORY  Chief Complaint Shortness of Breath    HPI Sarah Donaldson is a 68 y.o. adult brought to the ED from home with a chief complaint of shortness of breath.  Patient has a history of Parkinson's with tremors.  Spouse reported tremors are at baseline.  Patient complains of shortness of breath which started an hour prior to arrival while at rest.  Denies fever, cough, chest pain, abdominal pain, nausea, vomiting, diarrhea.  Denies recent travel, trauma or exposure to persons diagnosed with coronavirus.       Past Medical History:  Diagnosis Date   Cancer ([place])    Non-hodgkin's lymphoma (in remission)   Hyperlipidemia    Knee joint cyst, left 02/07/2018   Parkinson disease ([place])    Parkinson disease ([place])     Patient Active Problem List   Diagnosis Date Noted   Numbness 03/09/2018   Lumbar spondylosis 03/07/2018   Chronic pain of left knee 03/07/2018   Chronic upper back pain 12/08/2017   Benign neoplasm of hand 10/20/2017   Chronic pain syndrome 10/20/2017   Chronic pain of right lower extremity 10/20/2017   Chronic myofascial pain 10/20/2017   Numbness and tingling 07/12/2017   Sleep difficulties 07/12/2017   Low back pain 01/18/2017   Sleep behavior disorder, REM 01/18/2017   Lumbar radiculopathy 46/80/3212   Follicular lymphoma of extranodal and solid organ sites ([place]) 05/01/2016   Lymphoma ([place]) 04/30/2016   Hallux limitus 07/27/2015   Low back pain with sciatica 02/13/2015   Bilateral low back pain with sciatica 02/13/2015   H/O adenomatous polyp of colon 04/06/2014   Hx of adenomatous colonic polyps 04/06/2014   Back ache 01/25/2014   Adiposity 01/25/2014   REM sleep  behavior disorder 01/25/2014   Disordered sleep 01/25/2014   Has a tremor 01/25/2014   Obesity, unspecified 01/25/2014   Sleep disorder 01/25/2014   Backache 01/25/2014   Tremor 01/25/2014   Obesity 01/25/2014   Difficulty hearing 06/08/2012   Hearing loss 06/08/2012   Anxiety 06/06/2012   Colon polyp 06/06/2012   Clinical depression 06/06/2012   Hypercholesteremia 06/06/2012   Idiopathic Parkinson's disease ([place]) 06/06/2012   Detached retina 06/06/2012   Depression 06/06/2012   Retinal detachment 06/06/2012   Parkinson's disease ([place]) 06/06/2012    Past Surgical History:  Procedure Laterality Date   BACK SURGERY     BUNIONECTOMY     NASAL SEPTUM SURGERY     RETINAL DETACHMENT SURGERY      Prior to Admission medications   Medication Sig Start Date End Date Taking? Authorizing Provider  acetaminophen (TYLENOL) 650 MG CR tablet Take 650 mg by mouth 2 (two) times daily.    [provider]  amoxicillin (AMOXIL) 500 MG capsule  08/18/18   [provider]  azithromycin (ZITHROMAX) 250 MG tablet azithromycin 250 mg tablet    [provider]  carbidopa-levodopa (SINEMET IR) 25-100 MG tablet TAKE 2 TABLETS BY MOUTH 5 (FIVE) TIMES DAILY FOR 90 DAYS 11/08/18   [provider]  clonazePAM (KLONOPIN) 2 MG tablet Take 1-3 mg by mouth See admin instructions. 1 mg every morning and 3 mg at bedtime    [provider]  furosemide (LASIX) 20 MG tablet Take 20 mg by mouth 2 (two)  times a week.  11/06/18   [provider]  naproxen sodium (ALEVE) 220 MG tablet Take 220 mg by mouth daily as needed.    [provider]  neomycin-polymyxin b-dexamethasone (MAXITROL) 5.9-16384-6.6 OINT 1 application.    [provider]  omeprazole (PRILOSEC) 20 MG capsule Take 1 capsule (20 mg total) by mouth daily. 03/07/18 11/22/24  Vevelyn Francois, NP  pramipexole (MIRAPEX) 1.5 MG tablet Take 1.5 mg by mouth 3 (three) times  daily. 08/06/14   [provider]  prednisoLONE acetate (PRED FORTE) 1 % ophthalmic suspension Place 1 drop into the left eye daily. 10/07/17   [provider]  pregabalin (LYRICA) 25 MG capsule Take 1 capsule (25 mg total) by mouth 4 (four) times daily. 11/21/18 02/19/19  Vevelyn Francois, NP  selegiline (ELDEPRYL) 5 MG capsule Take 5 mg by mouth 2 (two) times daily with breakfast and lunch.     [provider]  terazosin (HYTRIN) 1 MG capsule TAKE 1 CAPSULE BY MOUTH DAILY ENDING AT 4MG  DAILY AS DIRECTED BY IN DEPTH INSTRUCTIONS GIVEN AT APPT 11/08/18   [provider]  terazosin (HYTRIN) 1 MG capsule Follow in depth instructions given at appointment, start at 1 mg daily ending at 4 mg daily 10/20/18   [provider]  traZODone (DESYREL) 50 MG tablet TAKE ONE AND A HALF TABLETS AT BEDTIME FOR SLEEP. TAKE 1/2 TABLET IF YOU WAKE DURING THE NIGHT. 10/04/18   [provider]  triamcinolone cream (KENALOG) 0.1 % APPLY TO AFFECTED AREA (BITES) TWICE A DAY FOR 2 WEEKS. AVOID FACE/GROIN/ARMPIT 09/12/18   [provider]    Allergies Codeine sulfate and Gabapentin  Family History  Problem Relation Age of Onset   Heart disease Mother    Breast cancer Neg Hx     Social History Social History   Tobacco Use   Smoking status: Former Smoker   Smokeless tobacco: Never Used  Substance Use Topics   Alcohol use: Yes    Alcohol/week: 0.0 standard drinks   Drug use: No    Review of Systems  Constitutional: No fever/chills Eyes: No visual changes. ENT: No sore throat. Cardiovascular: Denies chest pain. Respiratory: Positive for shortness of breath. Gastrointestinal: No abdominal pain.  No nausea, no vomiting.  No diarrhea.  No constipation. Genitourinary: Negative for dysuria. Musculoskeletal: Negative for back pain. Skin: Negative for rash. Neurological: Negative for headaches, focal weakness or  numbness.   ____________________________________________   PHYSICAL EXAM:  VITAL SIGNS: ED Triage Vitals [01/05/19 2136]  Enc Vitals Group     BP (!) 184/82     Pulse Rate (!) 115     Resp (!) 22     Temp 98.7 F (37.1 C)     Temp Source Oral     SpO2 98 %     Weight      Height      Head Circumference      Peak Flow      Pain Score 4     Pain Loc      Pain Edu?      Excl. in [place]?     Constitutional: Alert and oriented.  Frail appearing and in no acute distress. Eyes: Conjunctivae are normal. PERRL. EOMI. Head: Atraumatic. Nose: No congestion/rhinnorhea. Mouth/Throat: Mucous membranes are moist.  Oropharynx non-erythematous. Neck: No stridor.   Cardiovascular: Normal rate, regular rhythm. Grossly normal heart sounds.  Good peripheral circulation. Respiratory: Normal respiratory effort.  No retractions. Lungs CTAB. Gastrointestinal: Soft  and nontender. No distention. No abdominal bruits. No CVA tenderness. Musculoskeletal: Left knee appears chronically swollen with effusion.  There is no warmth or erythema.  Full range of motion with some pain.  2+ distal pulses.  Brisk, less than 5-second capillary refill. Neurologic: Baseline tremors.  Alert and oriented to person and place.  Normal speech and language. No gross focal neurologic deficits are appreciated.  Skin:  Skin is warm, dry and intact. No rash noted. Psychiatric: Mood and affect are normal. Speech and behavior are normal.  ____________________________________________   LABS (all labs ordered are listed, but only abnormal results are displayed)  Labs Reviewed  COMPREHENSIVE METABOLIC PANEL - Abnormal; Notable for the following components:      Result Value   Potassium 3.4 (*)    Glucose, Bld 109 (*)    BUN 30 (*)    Creatinine, Ser 0.42 (*)    All other components within normal limits  SARS CORONAVIRUS 2 (HOSPITAL ORDER, [place] [facility])  CBC WITH DIFFERENTIAL/PLATELET  TROPONIN I   BRAIN NATRIURETIC PEPTIDE   ____________________________________________  EKG  ED ECG REPORT I, Coral Timme J, the attending physician, personally viewed and interpreted this ECG.   Date: 01/06/2019  EKG Time: 2138  Rate: 119  Rhythm: sinus tachycardia  Axis: Normal  Intervals:none  ST&T Change: Nonspecific  ____________________________________________  RADIOLOGY  ED MD interpretation: No acute cardiopulmonary process; CT demonstrates no PE, known thyroid mass; no DVT on Doppler, Baker's cyst  Official radiology report(s): Ct Angio Chest Pe W/cm &/or Wo Cm  Result Date: 01/06/2019 CLINICAL DATA:  Shortness of breath EXAM: CT ANGIOGRAPHY CHEST WITH CONTRAST TECHNIQUE: Multidetector CT imaging of the chest was performed using the standard protocol during bolus administration of intravenous contrast. Multiplanar CT image reconstructions and MIPs were obtained to evaluate the vascular anatomy. CONTRAST:  37mL OMNIPAQUE IOHEXOL 350 MG/ML SOLN COMPARISON:  Chest x-ray 01/05/2019, PET-CT 05/18/2018, thyroid ultrasound 08/05/2018 FINDINGS: Cardiovascular: Satisfactory opacification of the pulmonary arteries to the segmental level. No evidence of pulmonary embolism. Nonaneurysmal aorta. No dissection. Mild aortic atherosclerosis. Small amount of iatrogenic gas in the pulmonary trunk. Borderline heart size. No pericardial effusion Mediastinum/Nodes: Midline trachea. Asymmetric enlargement left lobe of thyroid with 2.6 cm hypodense mass. No significantly enlarged lymph nodes. Moderate hiatal hernia. Lungs/Pleura: Scarring at the right base. No acute consolidation or effusion. 3 mm ground-glass nodule right upper lobe, no change. Upper Abdomen: No acute abnormality Musculoskeletal: Degenerative changes without acute or suspicious osseous abnormality Review of the MIP images confirms the above findings. IMPRESSION: 1. Negative for acute pulmonary embolus. 2. No acute pulmonary infiltrates. 3. 2.6 cm  hypodense mass in the left lobe of the thyroid, corresponding due known mass on ultrasound Aortic Atherosclerosis (ICD10-I70.0). Electronically Signed   By: Donavan Foil M.D.   On: 01/06/2019 01:03   US Venous Img Lower Unilateral Left  Result Date: 01/06/2019 CLINICAL DATA:  Initial evaluation for acute left calf pain. EXAM: LEFT LOWER EXTREMITY VENOUS DOPPLER ULTRASOUND TECHNIQUE: Gray-scale sonography with graded compression, as well as color Doppler and duplex ultrasound were performed to evaluate the lower extremity deep venous systems from the level of the common femoral vein and including the common femoral, femoral, profunda femoral, popliteal and calf veins including the posterior tibial, peroneal and gastrocnemius veins when visible. The superficial great saphenous vein was also interrogated. Spectral Doppler was utilized to evaluate flow at rest and with distal augmentation maneuvers in the common femoral, femoral and popliteal veins. COMPARISON:  None. FINDINGS: Contralateral Common Femoral Vein: Respiratory phasicity is normal and symmetric with the symptomatic side. No evidence of thrombus. Normal compressibility. Common Femoral Vein: No evidence of thrombus. Normal compressibility, respiratory phasicity and response to augmentation. Saphenofemoral Junction: No evidence of thrombus. Normal compressibility and flow on color Doppler imaging. Profunda Femoral Vein: No evidence of thrombus. Normal compressibility and flow on color Doppler imaging. Femoral Vein: No evidence of thrombus. Normal compressibility, respiratory phasicity and response to augmentation. Popliteal Vein: No evidence of thrombus. Normal compressibility, respiratory phasicity and response to augmentation. Calf Veins: Peroneal vein not seen. No evidence of thrombus. Normal compressibility and flow on color Doppler imaging. Superficial Great Saphenous Vein: No evidence of thrombus. Normal compressibility. Venous Reflux:  None. Other  Findings: 3.6 x 2.5 x 2.4 cm mildly complex collection within the left popliteal fossa, likely a Baker cyst. IMPRESSION: 1. No evidence of deep venous thrombosis. 2. 3.6 x 2.5 x 2.4 cm collection within the left popliteal fossa, most consistent with a Baker cyst. Electronically Signed   By: Jeannine Boga M.D.   On: 01/06/2019 03:18   Dg Chest Port 1 View  Result Date: 01/05/2019 CLINICAL DATA:  Initial evaluation for acute shortness of breath. EXAM: PORTABLE CHEST 1 VIEW COMPARISON:  Prior radiograph from 12/08/2017 FINDINGS: Mild cardiomegaly. Mediastinal silhouette within normal limits. Lungs normally inflated. Mild streaky left basilar subsegmental atelectasis. No focal infiltrates. No pulmonary edema or pleural effusion. No pneumothorax. No acute osseous finding. IMPRESSION: 1. Mild streaky left basilar subsegmental atelectasis. 2. No other active cardiopulmonary disease. Electronically Signed   By: Jeannine Boga M.D.   On: 01/05/2019 23:32    ____________________________________________   PROCEDURES  Procedure(s) performed (including Critical Care):  Procedures   ____________________________________________   INITIAL IMPRESSION / ASSESSMENT AND PLAN / ED COURSE  As part of my medical decision making, I reviewed the following data within the [place] notes reviewed and incorporated, Labs reviewed, EKG interpreted, Old chart reviewed, Radiograph reviewed and Notes from prior ED visits     Sarah Donaldson was evaluated in Emergency Department on 01/06/2019 for the symptoms described in the history of present illness. He was evaluated in the context of the global COVID-19 pandemic, which necessitated consideration that the patient might be at risk for infection with the SARS-CoV-2 virus that causes COVID-19. Institutional protocols and algorithms that pertain to the evaluation of patients at risk for COVID-19 are in a state of rapid change based on  information released by regulatory bodies including the CDC and federal and state organizations. These policies and algorithms were followed during the patient's care in the ED.   68 year old female with Parkinson's dementia who presents with shortness of breath. Differential includes, but is not limited to, viral syndrome, bronchitis including COPD exacerbation, pneumonia, reactive airway disease including asthma, CHF including exacerbation with or without pulmonary/interstitial edema, pneumothorax, ACS, thoracic trauma, and pulmonary embolism.  Laboratory and imaging results unremarkable.  Will obtain CTA chest and Doppler ultrasound to evaluate for DVT/PE.  Small fluid bolus given and Tylenol given for left knee arthritis.  Clinical Course as of Jan 05 325  Fri Jan 06, 2019  0324 Updated patient of all test results.  Patient is resting comfortably no acute distress.  Will Ace wrap left knee.  Strict return precautions given.  Patient verbalizes understanding and agrees with plan of care.   [JS]    Clinical Course User Index [JS] Paulette Blanch, MD     ____________________________________________  FINAL CLINICAL IMPRESSION(S) / ED DIAGNOSES  Final diagnoses:  Synovial cyst of left popliteal space  Shortness of breath     ED Discharge Orders    None       Note:  This document was prepared using Dragon voice recognition software and may include unintentional dictation errors.   Paulette Blanch, MD 01/06/19 [phone]

## 2019-01-05 NOTE — ED Triage Notes (Addendum)
Pt brought in by spouse, states SOB that started x1hr ago. PT hx of Parkinsons and has noted tremors, spouse states tremors are at baseline. PT 98% on RA. Denies any cough or fever . PT able to speak in full sentences.

## 2019-01-06 ENCOUNTER — Encounter: Payer: Self-pay | Admitting: Radiology

## 2019-01-06 ENCOUNTER — Emergency Department: Payer: Medicare Other

## 2019-01-06 DIAGNOSIS — M79662 Pain in left lower leg: Secondary | ICD-10-CM | POA: Diagnosis not present

## 2019-01-06 DIAGNOSIS — M7122 Synovial cyst of popliteal space [Baker], left knee: Secondary | ICD-10-CM | POA: Diagnosis not present

## 2019-01-06 DIAGNOSIS — R0602 Shortness of breath: Secondary | ICD-10-CM | POA: Diagnosis not present

## 2019-01-06 MED ORDER — IOHEXOL 350 MG/ML SOLN
75.0000 mL | Freq: Once | INTRAVENOUS | Status: AC | PRN
  Administered 2019-01-06: 75 mL via INTRAVENOUS
  Filled 2019-01-06: qty 75

## 2019-01-06 MED ORDER — ACETAMINOPHEN 325 MG PO TABS
650.0000 mg | ORAL_TABLET | Freq: Once | ORAL | Status: AC
  Administered 2019-01-06: 650 mg via ORAL
  Filled 2019-01-06: qty 2

## 2019-01-06 MED ORDER — SODIUM CHLORIDE 0.9 % IV BOLUS
500.0000 mL | Freq: Once | INTRAVENOUS | Status: AC
  Administered 2019-01-06: 500 mL via INTRAVENOUS

## 2019-01-06 NOTE — Discharge Instructions (Addendum)
You may remove Ace wrap to bathe and sleep.  Your x-ray and CT scan of your chest does not show pneumonia or blood clots.  The ultrasound of your left leg shows a cyst behind the knee.  Return to the ER for worsening symptoms, persistent vomiting, difficulty breathing or other concerns.

## 2019-01-11 DIAGNOSIS — G20 Parkinson's disease: Secondary | ICD-10-CM | POA: Diagnosis not present

## 2019-01-11 DIAGNOSIS — M7052 Other bursitis of knee, left knee: Secondary | ICD-10-CM | POA: Diagnosis not present

## 2019-01-11 DIAGNOSIS — C8190 Hodgkin lymphoma, unspecified, unspecified site: Secondary | ICD-10-CM | POA: Diagnosis not present

## 2019-01-11 DIAGNOSIS — M7051 Other bursitis of knee, right knee: Secondary | ICD-10-CM | POA: Diagnosis not present

## 2019-01-18 DIAGNOSIS — M25562 Pain in left knee: Secondary | ICD-10-CM | POA: Diagnosis not present

## 2019-01-25 ENCOUNTER — Other Ambulatory Visit: Payer: Self-pay | Admitting: Unknown Physician Specialty

## 2019-01-25 DIAGNOSIS — G4752 REM sleep behavior disorder: Secondary | ICD-10-CM | POA: Diagnosis not present

## 2019-01-25 DIAGNOSIS — R202 Paresthesia of skin: Secondary | ICD-10-CM | POA: Diagnosis not present

## 2019-01-25 DIAGNOSIS — R200 Anesthesia of skin: Secondary | ICD-10-CM | POA: Diagnosis not present

## 2019-01-25 DIAGNOSIS — E041 Nontoxic single thyroid nodule: Secondary | ICD-10-CM | POA: Diagnosis not present

## 2019-01-25 DIAGNOSIS — Z8572 Personal history of non-Hodgkin lymphomas: Secondary | ICD-10-CM | POA: Diagnosis not present

## 2019-01-25 DIAGNOSIS — G20 Parkinson's disease: Secondary | ICD-10-CM | POA: Diagnosis not present

## 2019-01-25 DIAGNOSIS — M5440 Lumbago with sciatica, unspecified side: Secondary | ICD-10-CM | POA: Diagnosis not present

## 2019-02-06 DIAGNOSIS — M25562 Pain in left knee: Secondary | ICD-10-CM | POA: Diagnosis not present

## 2019-02-06 DIAGNOSIS — M1712 Unilateral primary osteoarthritis, left knee: Secondary | ICD-10-CM | POA: Diagnosis not present

## 2019-02-08 DIAGNOSIS — R238 Other skin changes: Secondary | ICD-10-CM | POA: Diagnosis not present

## 2019-02-08 DIAGNOSIS — L565 Disseminated superficial actinic porokeratosis (DSAP): Secondary | ICD-10-CM | POA: Diagnosis not present

## 2019-02-08 DIAGNOSIS — T07XXXA Unspecified multiple injuries, initial encounter: Secondary | ICD-10-CM | POA: Diagnosis not present

## 2019-02-08 DIAGNOSIS — L02415 Cutaneous abscess of right lower limb: Secondary | ICD-10-CM | POA: Diagnosis not present

## 2019-02-13 ENCOUNTER — Telehealth: Payer: Self-pay

## 2019-02-13 NOTE — Telephone Encounter (Signed)
No answer, Left message on AM to call us back with some information.

## 2019-02-14 ENCOUNTER — Ambulatory Visit: Payer: Medicare Other | Admitting: Student in an Organized Health Care Education/Training Program

## 2019-02-14 ENCOUNTER — Other Ambulatory Visit: Payer: Self-pay

## 2019-02-14 DIAGNOSIS — M1712 Unilateral primary osteoarthritis, left knee: Secondary | ICD-10-CM | POA: Diagnosis not present

## 2019-02-14 NOTE — Progress Notes (Signed)
I attempted to call the patient however no response. Voicemail left instructing patient to call front desk office at [phone] to reschedule appointment. -Dr Layken Beg  

## 2019-02-21 ENCOUNTER — Encounter: Payer: Self-pay | Admitting: Student in an Organized Health Care Education/Training Program

## 2019-02-21 ENCOUNTER — Telehealth: Payer: Self-pay

## 2019-02-22 ENCOUNTER — Ambulatory Visit
Payer: Medicare Other | Attending: Nurse Practitioner | Admitting: Student in an Organized Health Care Education/Training Program

## 2019-02-22 ENCOUNTER — Encounter: Payer: Self-pay | Admitting: Student in an Organized Health Care Education/Training Program

## 2019-02-22 DIAGNOSIS — R296 Repeated falls: Secondary | ICD-10-CM | POA: Diagnosis not present

## 2019-02-22 DIAGNOSIS — Z981 Arthrodesis status: Secondary | ICD-10-CM | POA: Diagnosis not present

## 2019-02-22 DIAGNOSIS — M47816 Spondylosis without myelopathy or radiculopathy, lumbar region: Secondary | ICD-10-CM

## 2019-02-22 DIAGNOSIS — G8929 Other chronic pain: Secondary | ICD-10-CM

## 2019-02-22 DIAGNOSIS — G894 Chronic pain syndrome: Secondary | ICD-10-CM | POA: Diagnosis not present

## 2019-02-22 DIAGNOSIS — M5441 Lumbago with sciatica, right side: Secondary | ICD-10-CM | POA: Diagnosis not present

## 2019-02-22 DIAGNOSIS — M7918 Myalgia, other site: Secondary | ICD-10-CM

## 2019-02-22 DIAGNOSIS — M5416 Radiculopathy, lumbar region: Secondary | ICD-10-CM

## 2019-02-22 MED ORDER — PREGABALIN 50 MG PO CAPS: ORAL_CAPSULE | ORAL | 2 refills | Status: DC

## 2019-02-22 NOTE — Progress Notes (Signed)
Pain Management Virtual Encounter Note - Virtual Visit via Telephone Telehealth (real-time audio visits between healthcare provider and patient).   Patient's Phone No. & Preferred Pharmacy:  [phone] (home); [phone] (mobile); (Preferred) [phone] [email or web address]  [id number] - [place], [place] - [address] [place] [address] Phone: [phone] Fax: [phone]  [facility] [place] [facility], [place] - [address] [place] [address] Phone: [phone] Fax: [phone]  [id number] [place], [place] - [address] AT [facility] [address] [place] [address] Phone: [phone] Fax: [phone]    Pre-screening note:  Our staff contacted Sarah Donaldson and offered him an "in person", "face-to-face" appointment versus a telephone encounter. He indicated preferring the telephone encounter, at this time.   Reason for Virtual Visit: COVID-19*  Social distancing based on CDC and AMA recommendations.   I contacted Sarah Donaldson on 02/22/2019 via telephone.      I clearly identified myself as Gillis Santa, MD. I verified that I was speaking with the correct person using two identifiers (Name: Sarah Donaldson, and date of birth: Sep 12, 1950).  Advanced Informed Consent I sought verbal advanced consent from Sarah Donaldson for virtual visit interactions. I informed Sarah Donaldson of possible security and privacy concerns, risks, and limitations associated with providing "not-in-person" medical evaluation and management services. I also informed Sarah Donaldson of the availability of "in-person" appointments. Finally, I informed him that there would be a charge for the virtual visit and that he could be  personally, fully or partially, financially responsible for it. Sarah Donaldson expressed understanding and agreed to proceed.   Historic Elements    Sarah Donaldson is a 68 y.o. year old, adult patient evaluated today after his last encounter by our practice on 02/21/2019. Sarah Donaldson  has a past medical history of Cancer [facility]), Hyperlipidemia, Knee joint cyst, left (02/07/2018), Parkinson disease ([place]), and Parkinson disease ([place]). He also  has a past surgical history that includes Retinal detachment surgery; Bunionectomy; Nasal septum surgery; and Back surgery. Sarah Donaldson has a current medication list which includes the following prescription(s): acetaminophen, amoxicillin, azithromycin, carbidopa-levodopa, clonazepam, furosemide, naproxen sodium, neomycin-polymyxin b-dexamethasone, omeprazole, pramipexole, prednisolone acetate, selegiline, trazodone, triamcinolone cream, baclofen, chlorhexidine, pregabalin, clinpro 5000, terazosin, and terazosin. He  reports that he has quit smoking. He has never used smokeless tobacco. He reports current alcohol use. He reports that he does not use drugs. Sarah Donaldson is allergic to codeine sulfate and gabapentin.   HPI  Today, he is being contacted for medication management.   No change in medical history since last visit.  Patient's pain is at baseline.  Patient states that she is getting mild benefit with Lyrica.  Discussed increasing dose to 50 mg 3 times daily.  Risks and benefits reviewed and patient would like to give this increased dose of shot.  Instructed patient to start at 50 mg twice daily for 3 weeks and if no side effects can titrate up to 50 mg 3 times daily.  Patient endorsed understanding.  Pertinent Labs   SAFETY SCREENING Profile Lab Results  Component Value Date   SARSCOV2NAA NEGATIVE 01/05/2019   Renal Function Lab Results  Component Value Date   BUN 30 (H) 01/05/2019   CREATININE 0.42 (L) 01/05/2019   GFRAA >60 01/05/2019   GFRNONAA >60 01/05/2019   Hepatic Function Lab Results  Component Value Date  AST 20 01/05/2019   ALT 10 01/05/2019   ALBUMIN 3.8 01/05/2019    UDS No results found for: SUMMARY Note: Above Lab results reviewed.  Recent imaging  US Venous Img Lower Unilateral Left CLINICAL DATA:  Initial evaluation for acute left calf pain.  EXAM: LEFT LOWER EXTREMITY VENOUS DOPPLER ULTRASOUND  TECHNIQUE: Gray-scale sonography with graded compression, as well as color Doppler and duplex ultrasound were performed to evaluate the lower extremity deep venous systems from the level of the common femoral vein and including the common femoral, femoral, profunda femoral, popliteal and calf veins including the posterior tibial, peroneal and gastrocnemius veins when visible. The superficial great saphenous vein was also interrogated. Spectral Doppler was utilized to evaluate flow at rest and with distal augmentation maneuvers in the common femoral, femoral and popliteal veins.  COMPARISON:  None.  FINDINGS: Contralateral Common Femoral Vein: Respiratory phasicity is normal and symmetric with the symptomatic side. No evidence of thrombus. Normal compressibility.  Common Femoral Vein: No evidence of thrombus. Normal compressibility, respiratory phasicity and response to augmentation.  Saphenofemoral Junction: No evidence of thrombus. Normal compressibility and flow on color Doppler imaging.  Profunda Femoral Vein: No evidence of thrombus. Normal compressibility and flow on color Doppler imaging.  Femoral Vein: No evidence of thrombus. Normal compressibility, respiratory phasicity and response to augmentation.  Popliteal Vein: No evidence of thrombus. Normal compressibility, respiratory phasicity and response to augmentation.  Calf Veins: Peroneal vein not seen. No evidence of thrombus. Normal compressibility and flow on color Doppler imaging.  Superficial Great Saphenous Vein: No evidence of thrombus. Normal compressibility.  Venous Reflux:  None.  Other Findings: 3.6 x 2.5 x 2.4 cm mildly complex collection within the left  popliteal fossa, likely a Baker cyst.  IMPRESSION: 1. No evidence of deep venous thrombosis. 2. 3.6 x 2.5 x 2.4 cm collection within the left popliteal fossa, most consistent with a Baker cyst.  Electronically Signed   By: Jeannine Boga M.D.   On: 01/06/2019 03:18 CT Angio Chest PE W/Cm &/Or Wo Cm CLINICAL DATA:  Shortness of breath  EXAM: CT ANGIOGRAPHY CHEST WITH CONTRAST  TECHNIQUE: Multidetector CT imaging of the chest was performed using the standard protocol during bolus administration of intravenous contrast. Multiplanar CT image reconstructions and MIPs were obtained to evaluate the vascular anatomy.  CONTRAST:  81mL OMNIPAQUE IOHEXOL 350 MG/ML SOLN  COMPARISON:  Chest x-ray 01/05/2019, PET-CT 05/18/2018, thyroid ultrasound 08/05/2018  FINDINGS: Cardiovascular: Satisfactory opacification of the pulmonary arteries to the segmental level. No evidence of pulmonary embolism. Nonaneurysmal aorta. No dissection. Mild aortic atherosclerosis. Small amount of iatrogenic gas in the pulmonary trunk. Borderline heart size. No pericardial effusion  Mediastinum/Nodes: Midline trachea. Asymmetric enlargement left lobe of thyroid with 2.6 cm hypodense mass. No significantly enlarged lymph nodes. Moderate hiatal hernia.  Lungs/Pleura: Scarring at the right base. No acute consolidation or effusion. 3 mm ground-glass nodule right upper lobe, no change.  Upper Abdomen: No acute abnormality  Musculoskeletal: Degenerative changes without acute or suspicious osseous abnormality  Review of the MIP images confirms the above findings.  IMPRESSION: 1. Negative for acute pulmonary embolus. 2. No acute pulmonary infiltrates. 3. 2.6 cm hypodense mass in the left lobe of the thyroid, corresponding due known mass on ultrasound  Aortic Atherosclerosis (ICD10-I70.0).  Electronically Signed   By: Donavan Foil M.D.   On: 01/06/2019 01:03  Assessment  The primary encounter  diagnosis was Chronic pain syndrome. Diagnoses of Chronic myofascial pain, Lumbar spondylosis, S/P lumbar fusion, Lumbar  radiculopathy, Chronic bilateral low back pain with right-sided sciatica, and Multiple falls were also pertinent to this visit.  Plan of Care  I have discontinued Trinitie A. Norbeck's pregabalin. I am also having him start on pregabalin. Additionally, I am having him maintain his pramipexole, selegiline, prednisoLONE acetate, omeprazole, acetaminophen, clonazePAM, neomycin-polymyxin b-dexamethasone, azithromycin, terazosin, triamcinolone cream, furosemide, traZODone, amoxicillin, carbidopa-levodopa, terazosin, naproxen sodium, Baclofen, chlorhexidine, and Clinpro 5000.  Change of Lyrica from 25 mg 4 times daily to 50 mg 3 times daily.  Patient instructed to start at 50 mg twice daily and then titrate up to 3 times daily if no side effects.  Pharmacotherapy (Medications Ordered): Meds ordered this encounter  Medications  . pregabalin (LYRICA) 50 MG capsule    Sig: 50 mg BID for 3 weeks, if no side effects can increase to TID    Dispense:  90 capsule    Refill:  2   Orders:  No orders of the defined types were placed in this encounter.  Follow-up plan:   Return in about 3 months (around 05/25/2019) for Medication Management.    Recent Visits No visits were found meeting these conditions.  Showing recent visits within past 90 days and meeting all other requirements   Today's Visits Date Type Provider Dept  02/22/19 Office Visit Gillis Santa, MD [facility]  Showing today's visits and meeting all other requirements   Future Appointments No visits were found meeting these conditions.  Showing future appointments within next 90 days and meeting all other requirements   I discussed the assessment and treatment plan with the patient. The patient was provided an opportunity to ask questions and all were answered. The patient agreed with the plan and demonstrated  an understanding of the instructions.  Patient advised to call back or seek an in-person evaluation if the symptoms or condition worsens.  Note by: Gillis Santa, MD Date: 02/22/2019; Time: 2:59 PM  Note: This dictation was prepared with Dragon dictation. Any transcriptional errors that may result from this process are unintentional.  Disclaimer:  * Given the special circumstances of the COVID-19 pandemic, the federal government has announced that the Office for Civil Rights (OCR) will exercise its enforcement discretion and will not impose penalties on physicians using telehealth in the event of noncompliance with regulatory requirements under the [place] (HIPAA) in connection with the good faith provision of telehealth during the NUUVO-53 national public health emergency. ([place])

## 2019-02-27 DIAGNOSIS — M5416 Radiculopathy, lumbar region: Secondary | ICD-10-CM | POA: Diagnosis not present

## 2019-02-27 DIAGNOSIS — M1712 Unilateral primary osteoarthritis, left knee: Secondary | ICD-10-CM | POA: Diagnosis not present

## 2019-02-27 DIAGNOSIS — M25562 Pain in left knee: Secondary | ICD-10-CM | POA: Diagnosis not present

## 2019-03-06 DIAGNOSIS — M1712 Unilateral primary osteoarthritis, left knee: Secondary | ICD-10-CM | POA: Diagnosis not present

## 2019-03-07 DIAGNOSIS — M25562 Pain in left knee: Secondary | ICD-10-CM | POA: Diagnosis not present

## 2019-03-10 DIAGNOSIS — M25562 Pain in left knee: Secondary | ICD-10-CM | POA: Diagnosis not present

## 2019-03-10 DIAGNOSIS — R2689 Other abnormalities of gait and mobility: Secondary | ICD-10-CM | POA: Diagnosis not present

## 2019-03-14 DIAGNOSIS — M1712 Unilateral primary osteoarthritis, left knee: Secondary | ICD-10-CM | POA: Diagnosis not present

## 2019-03-21 DIAGNOSIS — H5789 Other specified disorders of eye and adnexa: Secondary | ICD-10-CM | POA: Diagnosis not present

## 2019-03-27 DIAGNOSIS — G20 Parkinson's disease: Secondary | ICD-10-CM | POA: Diagnosis not present

## 2019-03-27 DIAGNOSIS — M545 Low back pain: Secondary | ICD-10-CM | POA: Diagnosis not present

## 2019-03-27 DIAGNOSIS — R200 Anesthesia of skin: Secondary | ICD-10-CM | POA: Diagnosis not present

## 2019-03-27 DIAGNOSIS — R202 Paresthesia of skin: Secondary | ICD-10-CM | POA: Diagnosis not present

## 2019-03-27 DIAGNOSIS — G4752 REM sleep behavior disorder: Secondary | ICD-10-CM | POA: Diagnosis not present

## 2019-03-28 ENCOUNTER — Telehealth: Payer: Self-pay

## 2019-03-28 DIAGNOSIS — H5789 Other specified disorders of eye and adnexa: Secondary | ICD-10-CM | POA: Diagnosis not present

## 2019-03-28 NOTE — Telephone Encounter (Signed)
Spoke with patient's husband, advised that Dr. Holley Raring is ok with this.

## 2019-03-28 NOTE — Telephone Encounter (Signed)
He stated that Dr. Melrose Nakayama wants to increase his wife's lyrica from 50mg  to 75mg  and wanted to clear it with Dr. Holley Raring first.

## 2019-03-28 NOTE — Telephone Encounter (Signed)
Please advise 

## 2019-03-28 NOTE — Telephone Encounter (Signed)
That is fine with me.

## 2019-03-29 DIAGNOSIS — M4306 Spondylolysis, lumbar region: Secondary | ICD-10-CM | POA: Diagnosis not present

## 2019-03-29 DIAGNOSIS — Z9181 History of falling: Secondary | ICD-10-CM | POA: Diagnosis not present

## 2019-03-29 DIAGNOSIS — M1712 Unilateral primary osteoarthritis, left knee: Secondary | ICD-10-CM | POA: Diagnosis not present

## 2019-03-30 ENCOUNTER — Other Ambulatory Visit: Payer: Self-pay | Admitting: Physician Assistant

## 2019-03-30 DIAGNOSIS — M545 Low back pain, unspecified: Secondary | ICD-10-CM

## 2019-03-31 ENCOUNTER — Telehealth: Payer: Self-pay

## 2019-03-31 NOTE — Telephone Encounter (Signed)
Received a call from patient stating that she is feeling horrible.  Would like to speak to you about her medications.  Says she feel depressed.  Spoke with Dr Holley Raring and he instructed her to return back to the previous prescription of Lyrica 25 mg tid and call us for any further questions or concerns.

## 2019-04-02 ENCOUNTER — Emergency Department: Payer: Medicare Other

## 2019-04-02 ENCOUNTER — Other Ambulatory Visit: Payer: Self-pay

## 2019-04-02 DIAGNOSIS — Z8572 Personal history of non-Hodgkin lymphomas: Secondary | ICD-10-CM | POA: Insufficient documentation

## 2019-04-02 DIAGNOSIS — F410 Panic disorder [episodic paroxysmal anxiety] without agoraphobia: Secondary | ICD-10-CM | POA: Insufficient documentation

## 2019-04-02 DIAGNOSIS — Z87891 Personal history of nicotine dependence: Secondary | ICD-10-CM | POA: Diagnosis not present

## 2019-04-02 DIAGNOSIS — R0602 Shortness of breath: Secondary | ICD-10-CM | POA: Diagnosis not present

## 2019-04-02 DIAGNOSIS — G20 Parkinson's disease: Secondary | ICD-10-CM | POA: Diagnosis not present

## 2019-04-02 DIAGNOSIS — F419 Anxiety disorder, unspecified: Secondary | ICD-10-CM | POA: Diagnosis not present

## 2019-04-02 DIAGNOSIS — Z79899 Other long term (current) drug therapy: Secondary | ICD-10-CM | POA: Insufficient documentation

## 2019-04-02 LAB — COMPREHENSIVE METABOLIC PANEL
ALT: 8 U/L (ref 0–44)
AST: 22 U/L (ref 15–41)
Albumin: 3.7 g/dL (ref 3.5–5.0)
Alkaline Phosphatase: 108 U/L (ref 38–126)
Anion gap: 8 (ref 5–15)
BUN: 26 mg/dL — ABNORMAL HIGH (ref 8–23)
CO2: 24 mmol/L (ref 22–32)
Calcium: 9.1 mg/dL (ref 8.9–10.3)
Chloride: 109 mmol/L (ref 98–111)
Creatinine, Ser: 0.35 mg/dL — ABNORMAL LOW (ref 0.44–1.00)
GFR calc Af Amer: >60 mL/min (ref >60)
GFR calc non Af Amer: >60 mL/min (ref >60)
Glucose, Bld: 156 mg/dL — ABNORMAL HIGH (ref 70–99)
Potassium: 3.9 mmol/L (ref 3.5–5.1)
Sodium: 141 mmol/L (ref 135–145)
Total Bilirubin: 0.6 mg/dL (ref 0.3–1.2)
Total Protein: 6.6 g/dL (ref 6.5–8.1)

## 2019-04-02 LAB — CBC
HCT: 42.1 % (ref 36.0–46.0)
Hemoglobin: 13.2 g/dL (ref 12.0–15.0)
MCH: 27.4 pg (ref 26.0–34.0)
MCHC: 31.4 g/dL (ref 30.0–36.0)
MCV: 87.5 fL (ref 80.0–100.0)
Platelets: 233 10*3/uL (ref 150–400)
RBC: 4.81 MIL/uL (ref 3.87–5.11)
RDW: 14.6 % (ref 11.5–15.5)
WBC: 7.5 10*3/uL (ref 4.0–10.5)
nRBC: 0 % (ref 0.0–0.2)

## 2019-04-02 LAB — TROPONIN I (HIGH SENSITIVITY): Troponin I (High Sensitivity): 5 ng/L (ref <18)

## 2019-04-02 NOTE — ED Notes (Signed)
Patient assisted to bathroom. Patient requires one person assist to stand and turn.

## 2019-04-02 NOTE — ED Triage Notes (Signed)
Patient presents with anxiety and shortness of breath. Patient has Parkinson's diease and is wondering "if people with parkinson's get anxiety." Denies chest pain but states she is feeling short of breath.

## 2019-04-03 ENCOUNTER — Emergency Department
Admission: EM | Admit: 2019-04-03 | Discharge: 2019-04-03 | Disposition: A | Discharge status: Home / Self Care | Payer: Medicare Other | Attending: Emergency Medicine | Admitting: Emergency Medicine

## 2019-04-03 ENCOUNTER — Telehealth: Payer: Self-pay | Admitting: Student in an Organized Health Care Education/Training Program

## 2019-04-03 DIAGNOSIS — K219 Gastro-esophageal reflux disease without esophagitis: Secondary | ICD-10-CM | POA: Diagnosis not present

## 2019-04-03 DIAGNOSIS — M7052 Other bursitis of knee, left knee: Secondary | ICD-10-CM | POA: Diagnosis not present

## 2019-04-03 DIAGNOSIS — M7051 Other bursitis of knee, right knee: Secondary | ICD-10-CM | POA: Diagnosis not present

## 2019-04-03 DIAGNOSIS — F419 Anxiety disorder, unspecified: Secondary | ICD-10-CM | POA: Diagnosis not present

## 2019-04-03 DIAGNOSIS — M1712 Unilateral primary osteoarthritis, left knee: Secondary | ICD-10-CM | POA: Diagnosis not present

## 2019-04-03 DIAGNOSIS — F410 Panic disorder [episodic paroxysmal anxiety] without agoraphobia: Secondary | ICD-10-CM

## 2019-04-03 NOTE — Telephone Encounter (Signed)
Husband lvmail on 8-21 at 11:15, asking to speak with someone about Sarah Donaldson's Lyrica. Please call

## 2019-04-03 NOTE — ED Notes (Signed)
Reviewed discharge instructions and follow-up care with patient. Patient verbalized understanding of all information reviewed. Patient stable, with no distress noted at this time.    

## 2019-04-03 NOTE — ED Provider Notes (Signed)
[facility] Emergency Department Provider Note  ____________________________________________  Time seen: Approximately 12:33 AM  I have reviewed the triage vital signs and the nursing notes.   HISTORY  Chief Complaint Anxiety (Parkinson's disease) and Shortness of Breath   HPI Sarah Donaldson is a 68 y.o. female with history of non-Hodgkin's lymphoma in remission, Parkinson's disease, hyperlipidemia, anxiety, depression who presents for evaluation of panic attacks.  Patient reports that her depression has been worse over the last several weeks.  She does not take anything for depression.  She also has been having panic attacks over the last week.  She takes Klonopin for those which usually helps.  This evening she reports that her husband was "nagging me" which made her very anxious and she started having some trouble breathing.  She was hyperventilating.  She was having difficulty sleeping which prompted her visit to the emergency room.  She denies suicidal or homicidal ideation, any prior history of suicide attempt.  She feels back to baseline at this time.  She denies chest pain, cough, fever.  She is not a smoker.  No personal or family history of PE or DVT,  no hemoptysis, no leg pain or swelling, no exogenous hormones.  Past Medical History:  Diagnosis Date  . Cancer ([place])    Non-hodgkin's lymphoma (in remission)  . Hyperlipidemia   . Knee joint cyst, left 02/07/2018  . Parkinson disease ([place])   . Parkinson disease [facility])     Patient Active Problem List   Diagnosis Date Noted  . Numbness 03/09/2018  . Lumbar spondylosis 03/07/2018  . Chronic pain of left knee 03/07/2018  . Chronic upper back pain 12/08/2017  . Benign neoplasm of hand 10/20/2017  . Chronic pain syndrome 10/20/2017  . Chronic pain of right lower extremity 10/20/2017  . Chronic myofascial pain 10/20/2017  . Numbness and tingling 07/12/2017  . Sleep difficulties 07/12/2017  . Low  back pain 01/18/2017  . Sleep behavior disorder, REM 01/18/2017  . Lumbar radiculopathy 11/01/2016  . Follicular lymphoma of extranodal and solid organ sites [facility]) 05/01/2016  . Lymphoma ([place]) 04/30/2016  . Hallux limitus 07/27/2015  . Low back pain with sciatica 02/13/2015  . Bilateral low back pain with sciatica 02/13/2015  . H/O adenomatous polyp of colon 04/06/2014  . Hx of adenomatous colonic polyps 04/06/2014  . Back ache 01/25/2014  . Adiposity 01/25/2014  . REM sleep behavior disorder 01/25/2014  . Disordered sleep 01/25/2014  . Has a tremor 01/25/2014  . Obesity, unspecified 01/25/2014  . Sleep disorder 01/25/2014  . Backache 01/25/2014  . Tremor 01/25/2014  . Obesity 01/25/2014  . Difficulty hearing 06/08/2012  . Hearing loss 06/08/2012  . Anxiety 06/06/2012  . Colon polyp 06/06/2012  . Clinical depression 06/06/2012  . Hypercholesteremia 06/06/2012  . Idiopathic Parkinson's disease ([place]) 06/06/2012  . Detached retina 06/06/2012  . Depression 06/06/2012  . Retinal detachment 06/06/2012  . Parkinson's disease ([place]) 06/06/2012    Past Surgical History:  Procedure Laterality Date  . BACK SURGERY    . BUNIONECTOMY    . NASAL SEPTUM SURGERY    . RETINAL DETACHMENT SURGERY      Prior to Admission medications   Medication Sig Start Date End Date Taking? Authorizing Provider  acetaminophen (TYLENOL) 650 MG CR tablet Take 650 mg by mouth 2 (two) times daily.    [provider]  amoxicillin (AMOXIL) 500 MG capsule  08/18/18   [provider]  azithromycin (ZITHROMAX) 250 MG tablet  azithromycin 250 mg tablet    [provider]  Baclofen 5 MG TABS  12/28/18   [provider]  carbidopa-levodopa (SINEMET IR) 25-100 MG tablet TAKE 2 TABLETS BY MOUTH 5 (FIVE) TIMES DAILY FOR 90 DAYS 11/08/18   [provider]  chlorhexidine (PERIDEX) 0.12 % solution chlorhexidine gluconate 0.12 % mouthwash    [provider]  clonazePAM  (KLONOPIN) 2 MG tablet Take 1-3 mg by mouth See admin instructions. 1 mg every morning and 3 mg at bedtime    [provider]  furosemide (LASIX) 20 MG tablet Take 20 mg by mouth 2 (two) times a week.  11/06/18   [provider]  naproxen sodium (ALEVE) 220 MG tablet Take 220 mg by mouth daily as needed.    [provider]  neomycin-polymyxin b-dexamethasone (MAXITROL) [id number] OINT 1 application.    [provider]  omeprazole (PRILOSEC) 20 MG capsule Take 1 capsule (20 mg total) by mouth daily. 03/07/18 11/22/24  Vevelyn Francois, NP  pramipexole (MIRAPEX) 1.5 MG tablet Take 1.5 mg by mouth 3 (three) times daily. 08/06/14   [provider]  prednisoLONE acetate (PRED FORTE) 1 % ophthalmic suspension Place 1 drop into the left eye daily. 10/07/17   [provider]  pregabalin (LYRICA) 50 MG capsule 50 mg BID for 3 weeks, if no side effects can increase to TID 02/22/19   Gillis Santa, MD  selegiline (ELDEPRYL) 5 MG capsule Take 5 mg by mouth 2 (two) times daily with breakfast and lunch.     [provider]  Sodium Fluoride (CLINPRO 5000) 1.1 % PSTE Clinpro 5000 1.1 % dental paste  BRUSH TEETH AT BEDTIME AFTER FLOSSING THEN DO NOT RINSE    [provider]  terazosin (HYTRIN) 1 MG capsule TAKE 1 CAPSULE BY MOUTH DAILY ENDING AT 4MG  DAILY AS DIRECTED BY IN DEPTH INSTRUCTIONS GIVEN AT APPT 11/08/18   [provider]  terazosin (HYTRIN) 1 MG capsule Follow in depth instructions given at appointment, start at 1 mg daily ending at 4 mg daily 10/20/18   [provider]  traZODone (DESYREL) 50 MG tablet TAKE ONE AND A HALF TABLETS AT BEDTIME FOR SLEEP. TAKE 1/2 TABLET IF YOU WAKE DURING THE NIGHT. 10/04/18   [provider]  triamcinolone cream (KENALOG) 0.1 % APPLY TO AFFECTED AREA (BITES) TWICE A DAY FOR 2 WEEKS. AVOID FACE/GROIN/ARMPIT 09/12/18   [provider]    Allergies Codeine sulfate and  Gabapentin  Family History  Problem Relation Age of Onset  . Heart disease Mother   . Breast cancer Neg Hx     Social History Social History   Tobacco Use  . Smoking status: Former Research scientist (life sciences)  . Smokeless tobacco: Never Used  Substance Use Topics  . Alcohol use: Yes    Alcohol/week: 0.0 standard drinks  . Drug use: No    Review of Systems  Constitutional: Negative for fever. Eyes: Negative for visual changes. ENT: Negative for sore throat. Neck: No neck pain  Cardiovascular: Negative for chest pain. Respiratory: + shortness of breath. Gastrointestinal: Negative for abdominal pain, vomiting or diarrhea. Genitourinary: Negative for dysuria. Musculoskeletal: Negative for back pain. Skin: Negative for rash. Neurological: Negative for headaches, weakness or numbness. Psych: No SI or HI. + anxiety  ____________________________________________   PHYSICAL EXAM:  VITAL SIGNS: ED Triage Vitals  Enc Vitals Group     BP 04/02/19 2241 (!) 166/87     Pulse Rate 04/02/19 2241 (!) 105  Resp 04/02/19 2241 20     Temp 04/02/19 2241 98.9 F (37.2 C)     Temp Source 04/02/19 2241 Oral     SpO2 04/02/19 2241 94 %     Weight 04/02/19 2242 150 lb (68 kg)     Height 04/02/19 2242 5\' 6"  (1.676 m)     Head Circumference --      Peak Flow --      Pain Score 04/02/19 2242 10     Pain Loc --      Pain Edu? --      Excl. in [place]? --     Constitutional: Alert and oriented. Well appearing and in no apparent distress. HEENT:      Head: Normocephalic and atraumatic.         Eyes: Conjunctivae are normal. Sclera is non-icteric.       Mouth/Throat: Mucous membranes are moist.       Neck: Supple with no signs of meningismus. Cardiovascular: Tachycardic with regular rhythm. No murmurs, gallops, or rubs. 2+ symmetrical distal pulses are present in all extremities. No JVD. Respiratory: Normal respiratory effort. Lungs are clear to auscultation bilaterally. No wheezes, crackles, or rhonchi.   Gastrointestinal: Soft, non tender, and non distended with positive bowel sounds. No rebound or guarding. Musculoskeletal: Nontender with normal range of motion in all extremities. No edema, cyanosis, or erythema of extremities. Neurologic: Normal speech and language. Face is symmetric. Moving all extremities. No gross focal neurologic deficits are appreciated. Parkinson's tremor Skin: Skin is warm, dry and intact. No rash noted. Psychiatric: Mood and affect are normal. Speech and behavior are normal.  ____________________________________________   LABS (all labs ordered are listed, but only abnormal results are displayed)  Labs Reviewed  COMPREHENSIVE METABOLIC PANEL - Abnormal; Notable for the following components:      Result Value   Glucose, Bld 156 (*)    BUN 26 (*)    Creatinine, Ser 0.35 (*)    All other components within normal limits  CBC  TROPONIN I (HIGH SENSITIVITY)   ____________________________________________  EKG  ED ECG REPORT I, Rudene Re, the attending physician, personally viewed and interpreted this ECG.  EKG has a lot of artifact due to patient's Parkinson's tremor.  Sinus tachycardia, rate of 103, normal intervals, normal axis, no ST elevations or depressions.  EKG is unchanged from prior. ____________________________________________  RADIOLOGY  I have personally reviewed the images performed during this visit and I agree with the Radiologist's read.   Interpretation by Radiologist:  Dg Chest 2 View  Result Date: 04/02/2019 CLINICAL DATA:  Shortness of breath EXAM: CHEST - 2 VIEW COMPARISON:  01/05/2019 FINDINGS: Mildly low lung volumes. No focal opacity or pleural effusion. Cardiomediastinal silhouette is within normal limits. No pneumothorax. IMPRESSION: No active cardiopulmonary disease. Electronically Signed   By: Donavan Foil M.D.   On: 04/02/2019 23:25     ____________________________________________   PROCEDURES  Procedure(s)  performed: None Procedures Critical Care performed:  None ____________________________________________   INITIAL IMPRESSION / ASSESSMENT AND PLAN / ED COURSE  68 y.o. female with history of non-Hodgkin's lymphoma in remission, Parkinson's disease, hyperlipidemia, anxiety, depression who presents for evaluation of panic attacks.  Patient reports worsening depression symptoms over the last several weeks.  Today was in an argument with her husband which made her have a panic attack.  At this time she is calm and cooperative, denies any symptoms.  She does take Klonopin which she did take at home this evening.  Does  not take anything for depression.  Does not meet criteria for IVC.  She does have a physician who follows her for the symptoms.  Recommended close follow-up for management of her depression.  Discussed return precautions for any signs of suicidal homicidal ideation.  Her work-up here is essentially negative with no evidence of hypoxia, normal work of breathing, chest x-ray is clear, EKG with no ischemic changes, normal troponin and stable labs.  Discussed also return precautions for new or worsening shortness of breath, chest pain, cough or fever.  At this time patient be discharged home to the care of her husband.  She is in agreement with this plan.         As part of my medical decision making, I reviewed the following data within the [place] notes reviewed and incorporated, Labs reviewed , EKG interpreted , Old EKG reviewed, Old chart reviewed, Radiograph reviewed , Notes from prior ED visits and [place] Controlled Substance Database   Patient was evaluated in Emergency Department today for the symptoms described in the history of present illness. Patient was evaluated in the context of the global COVID-19 pandemic, which necessitated consideration that the patient might be at risk for infection with the SARS-CoV-2 virus that causes COVID-19. Institutional  protocols and algorithms that pertain to the evaluation of patients at risk for COVID-19 are in a state of rapid change based on information released by regulatory bodies including the CDC and federal and state organizations. These policies and algorithms were followed during the patient's care in the ED.   ____________________________________________   FINAL CLINICAL IMPRESSION(S) / ED DIAGNOSES   Final diagnoses:  Anxiety attack      NEW MEDICATIONS STARTED DURING THIS VISIT:  ED Discharge Orders    None       Note:  This document was prepared using Dragon voice recognition software and may include unintentional dictation errors.    Alfred Levins, [place], MD 04/03/19 [phone]

## 2019-04-03 NOTE — Telephone Encounter (Signed)
This was taken care of on Friday.

## 2019-04-03 NOTE — Discharge Instructions (Signed)
Follow-up with your doctor in 2 days.  Return to the emergency room for new or worsening shortness of breath or chest pain.  Take your Klonopin as prescribed.  Called the crisis hotline below if you develop any thoughts of harming yourself or others.  You have been seen in the Emergency Department (ED)  today for a psychiatric complaint.  You have been evaluated by psychiatry and we believe you are safe to be discharged from the hospital.    Please return to the Emergency Department (ED)  immediately if you have ANY thoughts of hurting yourself or anyone else, so that we may help you.  Please avoid alcohol and drug use.  Follow up with your doctor and/or therapist as soon as possible regarding today's ED  visit.   You may call crisis hotline for [facility] at [phone].

## 2019-04-03 NOTE — ED Notes (Signed)
Patient c/o continued anxiety. Patient asking if her husband is in lobby. RN informed patient that there are no visitors in lobby, and provided patient with a phone to call her husband.

## 2019-04-04 ENCOUNTER — Telehealth: Payer: Self-pay

## 2019-04-04 ENCOUNTER — Other Ambulatory Visit: Payer: Self-pay

## 2019-04-04 ENCOUNTER — Ambulatory Visit (INDEPENDENT_AMBULATORY_CARE_PROVIDER_SITE_OTHER): Payer: Medicare Other | Admitting: Psychiatry

## 2019-04-04 ENCOUNTER — Encounter: Payer: Self-pay | Admitting: Psychiatry

## 2019-04-04 DIAGNOSIS — F329 Major depressive disorder, single episode, unspecified: Secondary | ICD-10-CM | POA: Diagnosis not present

## 2019-04-04 DIAGNOSIS — F29 Unspecified psychosis not due to a substance or known physiological condition: Secondary | ICD-10-CM | POA: Diagnosis not present

## 2019-04-04 DIAGNOSIS — F410 Panic disorder [episodic paroxysmal anxiety] without agoraphobia: Secondary | ICD-10-CM | POA: Diagnosis not present

## 2019-04-04 DIAGNOSIS — F32A Depression, unspecified: Secondary | ICD-10-CM

## 2019-04-04 MED ORDER — HYDROXYZINE HCL 25 MG PO TABS: 12.5000 mg | ORAL_TABLET | Freq: Every day | ORAL | 1 refills | Status: DC | PRN

## 2019-04-04 MED ORDER — QUETIAPINE FUMARATE 50 MG PO TABS: 50.0000 mg | ORAL_TABLET | Freq: Every day | ORAL | 1 refills | Status: DC

## 2019-04-04 NOTE — Telephone Encounter (Signed)
went on [email or web address] and submitted the prior authorization - pending review.

## 2019-04-04 NOTE — Progress Notes (Signed)
Virtual Visit via Video Note  I connected with Sarah Donaldson on 04/04/19 at  3:00 PM EDT by a video enabled telemedicine application and verified that I am speaking with the correct person using two identifiers.   I discussed the limitations of evaluation and management by telemedicine and the availability of in person appointments. The patient expressed understanding and agreed to proceed.   I discussed the assessment and treatment plan with the patient. The patient was provided an opportunity to ask questions and all were answered. The patient agreed with the plan and demonstrated an understanding of the instructions.   The patient was advised to call back or seek an in-person evaluation if the symptoms worsen or if the condition fails to improve as anticipated.    Psychiatric Initial Adult Assessment   Patient Identification: Sarah Donaldson MRN:  [id number] Date of Evaluation:  04/04/2019 Referral Source: Dr.Zachary Potter Chief Complaint:   Chief Complaint    Establish Care; Pain; Insomnia; Depression     Visit Diagnosis:    ICD-10-CM   1. Panic attacks  F41.0 TSH    hydrOXYzine (ATARAX/VISTARIL) 25 MG tablet    QUEtiapine (SEROQUEL) 50 MG tablet  2. Psychosis, unspecified psychosis type ([place])  F29   3. Depressive disorder  F32.9 TSH    QUEtiapine (SEROQUEL) 50 MG tablet   unspecified    History of Present Illness: Jakesha is a 68 year old Caucasian female, married, lives in [place], has a history of Parkinson's disease, chronic pain, anxiety, psychosis, depressive symptoms, REM sleep behavior disorder, history of Hodgkin's lymphoma, hearing loss, vision loss left sided ,was evaluated by telemedicine today.  Patient being a limited historian her husband Mr. Malli Valk provided collateral information.  Patient today reports she was referred to our clinic by her neurologist for her anxiety and depressive symptoms.  She however reports she does not have a lot of  depressive symptoms going on.  She is more anxious.  She does feel sad on and off more so because of the COVID-19 situation and not being able to see her children more.  She reports she however worries all the time.  She had a panic attack recently and was taken to the emergency department.  She reports she was evaluated there and was told that it may have been an anxiety attack.  She reports she is also on clonazepam which helps.  Patient reports sleep is restless.  She was recently prescribed Seroquel by neurology.  She reports it does help to some extent.  She has a history of REM sleep behavior disorder and takes clonazepam.  She does report hallucinations.  She reports this has been going on since the past 1 year or so.  She reports she hears whispering as well as see images of people as well as vampires.  She reports it does bother her on and off.  She does not think the Seroquel which was recently started has helped much with her hallucinations.  She was told by her neurologist that her hallucinations are secondary to her Parkinson's disease.  Patient denies any history of bipolar disorder symptoms.  She denies any history of trauma.  She denies any substance abuse problems in the past.  She reports her husband is very supportive.  She does struggle with her health problems including her vision loss in her left eye which kind of limits her ability to function day by day.  She however reports good support system from her husband.    Associated  Signs/Symptoms: Depression Symptoms:  depressed mood, anxiety, disturbed sleep, (Hypo) Manic Symptoms:  Hallucinations, Anxiety Symptoms:  Panic Symptoms, Psychotic Symptoms:  Hallucinations: Auditory Visual PTSD Symptoms: Negative  Past Psychiatric History: Patient denies previously being treated by a psychiatrist.  Her medications were recently started by her neurologist.  She was recently started on Seroquel.  She denies inpatient mental  health admissions.  She denies suicide attempts.  Previous Psychotropic Medications: Yes Seroquel, Klonopin  Substance Abuse History in the last 12 months:  No.  Consequences of Substance Abuse: Negative  Past Medical History:  Past Medical History:  Diagnosis Date  . Cancer ([place])    Non-hodgkin's lymphoma (in remission)  . Hyperlipidemia   . Knee joint cyst, left 02/07/2018  . Parkinson disease ([place])   . Parkinson disease [facility])     Past Surgical History:  Procedure Laterality Date  . BACK SURGERY    . BUNIONECTOMY    . NASAL SEPTUM SURGERY    . RETINAL DETACHMENT SURGERY      Family Psychiatric History: Patient reports her father was an alcoholic.  Family History:  Family History  Problem Relation Age of Onset  . Heart disease Mother   . Alcohol abuse Father   . Breast cancer Neg Hx   . Mental illness Neg Hx     Social History:   Social History   Socioeconomic History  . Marital status: Married    Spouse name: Pocahontas Crus  . Number of children: 4  . Years of education: Not on file  . Highest education level: Not on file  Occupational History    Comment: retired  Scientific laboratory technician  . Financial resource strain: Not hard at all  . Food insecurity    Worry: Never true    Inability: Never true  . Transportation needs    Medical: No    Non-medical: No  Tobacco Use  . Smoking status: Former Research scientist (life sciences)  . Smokeless tobacco: Never Used  Substance and Sexual Activity  . Alcohol use: Yes    Alcohol/week: 0.0 standard drinks  . Drug use: No  . Sexual activity: Not Currently  Lifestyle  . Physical activity    Days per week: 0 days    Minutes per session: 0 min  . Stress: To some extent  Relationships  . Social Herbalist on phone: Not on file    Gets together: Not on file    Attends religious service: More than 4 times per year    Active member of club or organization: No    Attends meetings of clubs or organizations: Never    Relationship status:  Married  Other Topics Concern  . Not on file  Social History Narrative  . Not on file    Additional Social History: Patient lives in [place] with her husband.  They have been married since the past 39 years.  She has 4 adult children and several grandchildren.  She has good relationship with them.  She was raised by her parents.  She graduated high school and went to 1 year of college.  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  She is currently retired.  Allergies:   Allergies  Allergen Reactions  . Codeine Sulfate Nausea And Vomiting  . Gabapentin Swelling    Mouth swelling   . Terazosin     Metabolic Disorder Labs: No results found for: HGBA1C, MPG No results found for: PROLACTIN No results found for: CHOL,  TRIG, HDL, CHOLHDL, VLDL, LDLCALC No results found for: TSH  Therapeutic Level Labs: No results found for: LITHIUM No results found for: CBMZ No results found for: VALPROATE  Current Medications: Current Outpatient Medications  Medication Sig Dispense Refill  . acetaminophen (TYLENOL) 650 MG CR tablet Take 650 mg by mouth 2 (two) times daily.    . carbidopa-levodopa (SINEMET IR) 25-100 MG tablet TAKE 2 TABLETS BY MOUTH 5 (FIVE) TIMES DAILY FOR 90 DAYS    . clonazePAM (KLONOPIN) 2 MG tablet Take 1-3 mg by mouth See admin instructions. 1 mg every morning and 3 mg at bedtime    . diclofenac (VOLTAREN) 50 MG EC tablet Take 50 mg by mouth 2 (two) times daily.    . furosemide (LASIX) 20 MG tablet Take 20 mg by mouth 2 (two) times a week.     . neomycin-polymyxin b-dexamethasone (MAXITROL) [id number] OINT 1 application.    Marland Kitchen omeprazole (PRILOSEC) 20 MG capsule Take 1 capsule (20 mg total) by mouth daily. 90 capsule 0  . prednisoLONE acetate (PRED FORTE) 1 % ophthalmic suspension Place 1 drop into the left eye daily.  5  . selegiline (ELDEPRYL) 5 MG capsule Take 5 mg by mouth 2 (two) times daily with breakfast and lunch.   1  . Sodium Fluoride (CLINPRO  5000) 1.1 % PSTE Clinpro 5000 1.1 % dental paste  BRUSH TEETH AT BEDTIME AFTER FLOSSING THEN DO NOT RINSE    . traZODone (DESYREL) 50 MG tablet TAKE ONE AND A HALF TABLETS AT BEDTIME FOR SLEEP. TAKE 1/2 TABLET IF YOU WAKE DURING THE NIGHT.    Marland Kitchen triamcinolone cream (KENALOG) 0.1 % APPLY TO AFFECTED AREA (BITES) TWICE A DAY FOR 2 WEEKS. AVOID FACE/GROIN/ARMPIT    . hydrOXYzine (ATARAX/VISTARIL) 25 MG tablet Take 0.5-1 tablets (12.5-25 mg total) by mouth daily as needed. 30 tablet 1  . QUEtiapine (SEROQUEL) 50 MG tablet Take 1 tablet (50 mg total) by mouth at bedtime. 30 tablet 1   No current facility-administered medications for this visit.     Musculoskeletal: Strength & Muscle Tone: UTA Gait & Station: Observed as seated Patient leans: N/A  Psychiatric Specialty Exam: Review of Systems  Psychiatric/Behavioral: Positive for depression and hallucinations. The patient is nervous/anxious and has insomnia.   All other systems reviewed and are negative.   There were no vitals taken for this visit.There is no height or weight on file to calculate BMI.  General Appearance: Casual  Eye Contact:  Fair  Speech:  Clear and Coherent  Volume:  Normal  Mood:  Anxious  Affect:  Appropriate  Thought Process:  Goal Directed and Descriptions of Associations: Intact  Orientation:  Full (Time, Place, and Person)  Thought Content:  Logical  Suicidal Thoughts:  No  Homicidal Thoughts:  No  Memory:  Immediate;   Fair Recent;   Fair Remote;   Fair  Judgement:  Fair  Insight:  Fair  Psychomotor Activity:  Normal  Concentration:  Concentration: Fair and Attention Span: Fair  Recall:  [employer] of Knowledge:Fair  Language: Fair  Akathisia:  No  Handed:  Right  AIMS (if indicated): UTA  Assets:  Communication Skills Desire for Improvement Social Support  ADL's:  Intact  Cognition: WNL  Sleep:  restless   Screenings: PHQ2-9     Office Visit from 04/04/2019 in [place] from 06/02/2018 in [place] Office Visit from 04/21/2018 in [place]  [facility] Clinical Support from 03/07/2018 in [place] Office Visit from 01/11/2018 in [place] [facility]  PHQ-2 Total Score  1  0  0  0  0      Assessment and Plan: Genny is a 68 year old Caucasian female, married, retired, lives in [place] with her husband, has a history of Parkinson's disease, psychosis, depression unspecified, anxiety disorder unspecified, REM sleep behavior disorder, chronic pain, hearing loss, vision loss in her left side, history of Hodgkin's lymphoma was evaluated by telemedicine today.  Patient is biologically predisposed given her multiple health problems including physical limitations due to her Parkinson's disease as well as her hearing loss and vision loss.  Patient also struggles with chronic pain which could also contribute to her mood symptoms.  Patient does report hallucinations since the past 1 year .  Patient is on multiple medications including selegiline which can have drug to drug interaction with medications especially antidepressants including serotonin syndrome.  Patient hence advised to start intensive psychotherapy sessions.  Discussed plan as noted below.  Plan Psychosis unspecified-likely secondary to Parkinson's disease Increase Seroquel to 50 mg p.o. nightly Discussed with patient to monitor herself for side effects including dizziness, falls, orthostatic hypotension.   For Panic attacks- unstable Patient is on Klonopin 1 mg during the day and 2 mg at bedtime-prescribed by neurology for REM sleep behavior disorder which also helps with her panic attacks. Add hydroxyzine 12.5-25 mg p.o. daily PRN-advised her to limit use and use it only 2-3 times a week for severe panic attacks. Seroquel will  also help with her anxiety symptoms. Referral for psychotherapy sessions- will refer to Ms. Miguel Dibble.  Depressive disorder-unspecified likely secondary to her medical problems Refer for CBT. Discussed with patient interaction of antidepressants with selegiline including serotonin syndrome. Seroquel will also help with her mood.   Will order TSH- will mail it to her.  I have reviewed EKG-dated 04/03/2019- tachycardia, QTC-within normal limits.  I have reviewed medical records per Dr. Potter-neurology dated 01/25/2019- patient with Parkinson's disease- on Sinemet, selegiline.  Patient with REM sleep behavior disorder-trazodone 50 mg and an extra half tablet as needed.  Clonazepam half tablet in the morning and 2 mg at bedtime.  Patient with numbness and tingling-continue Lyrica.  Patient with chronic back pain- naproxen as well as baclofen as prescribed.'   Follow-up in clinic in 3 to 4 weeks or sooner if needed.  Appointment scheduled for September 25 at 11 AM  I have spent atleast 60 minutes non  face to face with patient today. More than 50 % of the time was spent for psychoeducation and supportive psychotherapy and care coordination.  This note was generated in part or whole with voice recognition software. Voice recognition is usually quite accurate but there are transcription errors that can and very often do occur. I apologize for any typographical errors that were not detected and corrected.         Ursula Alert, MD 8/25/20206:33 PM

## 2019-04-04 NOTE — Telephone Encounter (Signed)
received a prior auth request for the hydroxyzine hcl

## 2019-04-05 ENCOUNTER — Telehealth: Payer: Self-pay

## 2019-04-05 NOTE — Telephone Encounter (Signed)
Returned call to patient. She is worried about having to take Seroquel 50 mg and also about side effects listed like anxiety , depression on the informations sheet. She wonders why she has to take something that causes it when already has depression and anxiety. Provided medication education.  Discussed to start at a lower dosage since she is worried - start seroquel 37.5 mg for 2 weeks and increase to 50 mg after that if she tolerates it ok.

## 2019-04-05 NOTE — Telephone Encounter (Signed)
received fax that PA for hyroxyzine hcl was approved. from  01-04-19 to 04-03-2020

## 2019-04-05 NOTE — Telephone Encounter (Signed)
faxed and confirmed notification of PA approval.

## 2019-04-05 NOTE — Telephone Encounter (Signed)
pt husband called states that you increase the quetiapine and that they had questions about it.  he states that they read the side affects of that medication and the side affects is what she is going threw. should they discontinue medication.

## 2019-04-06 ENCOUNTER — Telehealth: Payer: Self-pay

## 2019-04-06 NOTE — Telephone Encounter (Signed)
I have sent a message to Lea to schedule this patient for tomorrow

## 2019-04-06 NOTE — Telephone Encounter (Signed)
pt states that the quetiapine is not working and hydroxyzine also not working. pt states she feels worse and that her speech is being affected and that her depress seems worse also.

## 2019-04-07 ENCOUNTER — Encounter: Payer: Self-pay | Admitting: Psychiatry

## 2019-04-07 ENCOUNTER — Other Ambulatory Visit: Payer: Self-pay

## 2019-04-07 ENCOUNTER — Ambulatory Visit (INDEPENDENT_AMBULATORY_CARE_PROVIDER_SITE_OTHER): Payer: Medicare Other | Admitting: Psychiatry

## 2019-04-07 DIAGNOSIS — F32A Depression, unspecified: Secondary | ICD-10-CM

## 2019-04-07 DIAGNOSIS — F29 Unspecified psychosis not due to a substance or known physiological condition: Secondary | ICD-10-CM

## 2019-04-07 DIAGNOSIS — F410 Panic disorder [episodic paroxysmal anxiety] without agoraphobia: Secondary | ICD-10-CM | POA: Diagnosis not present

## 2019-04-07 DIAGNOSIS — F329 Major depressive disorder, single episode, unspecified: Secondary | ICD-10-CM | POA: Diagnosis not present

## 2019-04-07 NOTE — Progress Notes (Signed)
Virtual Visit via Telephone Note  I connected with Sarah Donaldson on 04/07/19 at  9:30 AM EDT by telephone and verified that I am speaking with the correct person using two identifiers.   I discussed the limitations, risks, security and privacy concerns of performing an evaluation and management service by telephone and the availability of in person appointments. I also discussed with the patient that there may be a patient responsible charge related to this service. The patient expressed understanding and agreed to proceed.    I discussed the assessment and treatment plan with the patient. The patient was provided an opportunity to ask questions and all were answered. The patient agreed with the plan and demonstrated an understanding of the instructions.   The patient was advised to call back or seek an in-person evaluation if the symptoms worsen or if the condition fails to improve as anticipated.    [facility] MD OP Progress Note  04/07/2019 12:58 PM Sarah Donaldson  MRN:  [id number]  Chief Complaint:  Chief Complaint    Follow-up     HPI: Sarah Donaldson is a 68 year old Caucasian female, married, lives in [place], has a history of Parkinson's disease, psychosis likely secondary to her medical problems, chronic pain, anxiety, depression, REM sleep behavior disorder, history of Hodgkin's lymphoma, hearing loss, vision loss left sided was evaluated by telemedicine today.  Patient was seen today since she had called the clinic yesterday evening reporting that she was having problems with the medications.  Patient's husband Mr. Ketura Salata provided collateral information.  Patient reported that she did not want to take Seroquel since the information sheet about Seroquel shows side effect of depression and anxiety.  She wanted to know why writer would prescribe her a medication which causes her depression and anxiety when she is here to get help with the same.  Writer attempted to provide  medication education.  Discussed with patient that if this medication does increase her anxiety or depression then she can be started on another new medication.  However medication needs time to give its full benefit.  Discussed with her that her dosage was just increased recently and she needs to give the medication more time to get benefit with her anxiety or depression.  Patient reports she continues to hear people talking especially at night which affects her sleep.  She is also on trazodone.  The trazodone does help to some extent with her sleep.  Discussed with patient that the Seroquel is also indicated for the auditory hallucinations that she has.  Patient reports she just wants to stay on the hydroxyzine as needed and does not want to take the Seroquel anymore.  Patient was referred to therapist-Ms. Miguel Dibble for psychotherapy sessions.  Patient reports she has her first appointment coming up in a week.  Discussed with patient that since she is not interested in any medications for depression and anxiety writer would recommend that she stay with her therapist and get cognitive behavioral therapy for her mood symptoms at this time. Discussed that writer would release her back to her primary provider as well as her neurologist since she is not interested in medications.  Patient agrees with this plan.  Patient currently denies any suicidality, homicidality.  She has good support system from her husband.  She appeared to be alert oriented to person place and situation.   Visit Diagnosis:    ICD-10-CM   1. Panic attacks  F41.0   2. Psychosis, unspecified psychosis type ([place])  F29    likely secondary to medical problems   3. Depressive disorder  F32.9     Past Psychiatric History: I have reviewed past psychiatric history from my progress note on 04/04/2019.  Past trials of Seroquel, Klonopin.  Past Medical History:  Past Medical History:  Diagnosis Date  . Cancer ([place])     Non-hodgkin's lymphoma (in remission)  . Hyperlipidemia   . Knee joint cyst, left 02/07/2018  . Parkinson disease ([place])   . Parkinson disease [facility])     Past Surgical History:  Procedure Laterality Date  . BACK SURGERY    . BUNIONECTOMY    . NASAL SEPTUM SURGERY    . RETINAL DETACHMENT SURGERY      Family Psychiatric History: I have reviewed family psychiatric history from my progress note on 04/04/2019.  Family History:  Family History  Problem Relation Age of Onset  . Heart disease Mother   . Alcohol abuse Father   . Breast cancer Neg Hx   . Mental illness Neg Hx     Social History: I have reviewed social history from my progress note on 04/04/2019. Social History   Socioeconomic History  . Marital status: Married    Spouse name: Sarah Donaldson  . Number of children: 4  . Years of education: Not on file  . Highest education level: Not on file  Occupational History    Comment: retired  Scientific laboratory technician  . Financial resource strain: Not hard at all  . Food insecurity    Worry: Never true    Inability: Never true  . Transportation needs    Medical: No    Non-medical: No  Tobacco Use  . Smoking status: Former Research scientist (life sciences)  . Smokeless tobacco: Never Used  Substance and Sexual Activity  . Alcohol use: Yes    Alcohol/week: 0.0 standard drinks  . Drug use: No  . Sexual activity: Not Currently  Lifestyle  . Physical activity    Days per week: 0 days    Minutes per session: 0 min  . Stress: To some extent  Relationships  . Social Herbalist on phone: Not on file    Gets together: Not on file    Attends religious service: More than 4 times per year    Active member of club or organization: No    Attends meetings of clubs or organizations: Never    Relationship status: Married  Other Topics Concern  . Not on file  Social History Narrative  . Not on file    Allergies:  Allergies  Allergen Reactions  . Codeine Sulfate Nausea And Vomiting  . Gabapentin  Swelling    Mouth swelling   . Terazosin     Metabolic Disorder Labs: No results found for: HGBA1C, MPG No results found for: PROLACTIN No results found for: CHOL, TRIG, HDL, CHOLHDL, VLDL, LDLCALC No results found for: TSH  Therapeutic Level Labs: No results found for: LITHIUM No results found for: VALPROATE No components found for:  CBMZ  Current Medications: Current Outpatient Medications  Medication Sig Dispense Refill  . acetaminophen (TYLENOL) 650 MG CR tablet Take 650 mg by mouth 2 (two) times daily.    . carbidopa-levodopa (SINEMET IR) 25-100 MG tablet TAKE 2 TABLETS BY MOUTH 5 (FIVE) TIMES DAILY FOR 90 DAYS    . clonazePAM (KLONOPIN) 2 MG tablet Take 1-3 mg by mouth See admin instructions. 1 mg every morning and 3 mg at bedtime    . diclofenac (VOLTAREN) 50  MG EC tablet Take 50 mg by mouth 2 (two) times daily.    . furosemide (LASIX) 20 MG tablet Take 20 mg by mouth 2 (two) times a week.     . hydrOXYzine (ATARAX/VISTARIL) 25 MG tablet Take 0.5-1 tablets (12.5-25 mg total) by mouth daily as needed. 30 tablet 1  . neomycin-polymyxin b-dexamethasone (MAXITROL) [id number] OINT 1 application.    Marland Kitchen omeprazole (PRILOSEC) 20 MG capsule Take 1 capsule (20 mg total) by mouth daily. 90 capsule 0  . prednisoLONE acetate (PRED FORTE) 1 % ophthalmic suspension Place 1 drop into the left eye daily.  5  . QUEtiapine (SEROQUEL) 50 MG tablet Take 1 tablet (50 mg total) by mouth at bedtime. 30 tablet 1  . selegiline (ELDEPRYL) 5 MG capsule Take 5 mg by mouth 2 (two) times daily with breakfast and lunch.   1  . Sodium Fluoride (CLINPRO 5000) 1.1 % PSTE Clinpro 5000 1.1 % dental paste  BRUSH TEETH AT BEDTIME AFTER FLOSSING THEN DO NOT RINSE    . traZODone (DESYREL) 50 MG tablet TAKE ONE AND A HALF TABLETS AT BEDTIME FOR SLEEP. TAKE 1/2 TABLET IF YOU WAKE DURING THE NIGHT.    Marland Kitchen triamcinolone cream (KENALOG) 0.1 % APPLY TO AFFECTED AREA (BITES) TWICE A DAY FOR 2 WEEKS. AVOID FACE/GROIN/ARMPIT      No current facility-administered medications for this visit.      Musculoskeletal: Strength & Muscle Tone: UTA Gait & Station: Observed as seated Patient leans: N/A  Psychiatric Specialty Exam: Review of Systems  Psychiatric/Behavioral: Positive for depression. The patient is nervous/anxious.   All other systems reviewed and are negative.   There were no vitals taken for this visit.There is no height or weight on file to calculate BMI.  General Appearance: Casual  Eye Contact:  Fair  Speech:  Clear and Coherent  Volume:  Normal  Mood:  Anxious and Depressed  Affect:  Congruent  Thought Process:  Goal Directed and Descriptions of Associations: Intact  Orientation:  Full (Time, Place, and Person)  Thought Content: Logical reports on and off AH at night -chronic  Suicidal Thoughts:  No  Homicidal Thoughts:  No  Memory:  Immediate;   Fair Recent;   Fair Remote;   Fair  Judgement:  Fair  Insight:  Fair  Psychomotor Activity:  Normal  Concentration:  Concentration: Fair and Attention Span: Fair  Recall:  [employer] of Knowledge: Fair  Language: Fair  Akathisia:  No  Handed:  Right  AIMS (if indicated): UTA  Assets:  Armed forces logistics/support/administrative officer Social Support  ADL's:  Intact  Cognition: WNL  Sleep:  restless   Screenings: PHQ2-9     Office Visit from 04/04/2019 in [facility] from 06/02/2018 in [facility] Office Visit from 04/21/2018 in [facility] from 03/07/2018 in [facility] Office Visit from 01/11/2018 in [facility]  PHQ-2 Total Score  1  0  0  0  0       Assessment and Plan: Sarah Donaldson is a 68 year old Caucasian female, married, retired, lives in [place] with her husband, has a history of Parkinson's disease, psychosis, depression  unspecified, anxiety disorder, REM sleep behavior disorder, chronic pain, hearing loss, vision loss in her left side, history of Hodgkin's lymphoma was evaluated by telemedicine today.  Patient is biologically predisposed given her multiple problems including Parkinson's disease  as well as hearing loss and vision loss.  She also struggles with chronic pain.  Patient does report hallucinations since the past 1 year or so.  Patient however reports today that she does not want to be on any medications for her depression and anxiety and is agreeable to just continue psychotherapy sessions at this time.  Plan Psychosis unspecified-likely secondary to Parkinson's disease Patient is currently on Seroquel, her dosage was increased during her visit with me on 04/04/2019. Patient however called back  the clinic multiple times after that reporting problems with increased dosage.  Patient hence was evaluated today by telemedicine.  Patient today reports she does not want to take Seroquel or any other medications for her mood. Discussed with patient that since Seroquel was started by her neurologist to discuss with him prior to stopping it completely.  Also discussed with her that the Seroquel not only helps with sleep and anxiety but also with her hallucinations.  Patient agrees to return to her neurologist.  For panic attacks-unstable Patient is already on clonazepam which is prescribed for REM sleep behavior disorder. Hydroxyzine was added 12.5 to 25 mg p.o. daily as needed for severe anxiety attacks.  Patient has been referred to psychotherapist Ms. Miguel Dibble in the community.  She has upcoming appointment and she will start CBT and panic focused therapy.  Patient agrees to do so.  Since patient is not interested in medication management for her current mood or psychosis.  Will release her back to her primary provider as well as her neurologist.  Patient agrees with plan.  I have spent atleast 15 minutes  non face to face with patient today. More than 50 % of the time was spent for psychoeducation and supportive psychotherapy and care coordination. This note was generated in part or whole with voice recognition software. Voice recognition is usually quite accurate but there are transcription errors that can and very often do occur. I apologize for any typographical errors that were not detected and corrected.        Ursula Alert, MD 04/07/2019, 12:58 PM

## 2019-04-08 ENCOUNTER — Ambulatory Visit
Admission: RE | Admit: 2019-04-08 | Discharge: 2019-04-08 | Disposition: A | Discharge status: Home / Self Care | Payer: Medicare Other | Source: Ambulatory Visit | Attending: Physician Assistant | Admitting: Physician Assistant

## 2019-04-08 ENCOUNTER — Ambulatory Visit: Payer: Medicare Other

## 2019-04-08 DIAGNOSIS — M545 Low back pain, unspecified: Secondary | ICD-10-CM

## 2019-04-10 ENCOUNTER — Telehealth: Payer: Self-pay

## 2019-04-10 NOTE — Telephone Encounter (Signed)
busy

## 2019-04-10 NOTE — Telephone Encounter (Signed)
Patient was called and evaluated multiple times the past week .Patient did not want medications and was transitioned back to her PMD , neurology . Patient agreed with the same and had agreed to continue CBT with Ms.Miguel Dibble. I had also communicated with Ms.Grandville Silos on Friday regarding patient.

## 2019-04-10 NOTE — Telephone Encounter (Signed)
pt husband called states that his wife is having bad anxiety and depression. he then put patient on the phone . pt was tearful and stating that she is having bad anxiety. pt was told that on 04-07-19 she had been dismissed due to pt not follow medication and treatment plan. Pt states that the medication that dr. Shea Evans wanted her to take had the same symptoms she was having. Pt was advised that I would send dr. Shea Evans a message.

## 2019-04-10 NOTE — Telephone Encounter (Signed)
husband answered the phone. pt husband was given the instructions per dr. Shea Evans order.

## 2019-04-10 NOTE — Telephone Encounter (Signed)
received a fax comfirmation that pt has appt with he set up for 04-14-19 at 1:00pm

## 2019-04-11 DIAGNOSIS — G20 Parkinson's disease: Secondary | ICD-10-CM | POA: Diagnosis not present

## 2019-04-11 DIAGNOSIS — R200 Anesthesia of skin: Secondary | ICD-10-CM | POA: Diagnosis not present

## 2019-04-11 DIAGNOSIS — R202 Paresthesia of skin: Secondary | ICD-10-CM | POA: Diagnosis not present

## 2019-04-11 DIAGNOSIS — M545 Low back pain: Secondary | ICD-10-CM | POA: Diagnosis not present

## 2019-04-11 DIAGNOSIS — G4752 REM sleep behavior disorder: Secondary | ICD-10-CM | POA: Diagnosis not present

## 2019-04-12 ENCOUNTER — Other Ambulatory Visit: Payer: Self-pay | Admitting: Physician Assistant

## 2019-04-12 DIAGNOSIS — M1712 Unilateral primary osteoarthritis, left knee: Secondary | ICD-10-CM

## 2019-04-13 DIAGNOSIS — M5136 Other intervertebral disc degeneration, lumbar region: Secondary | ICD-10-CM | POA: Diagnosis not present

## 2019-04-13 DIAGNOSIS — M1712 Unilateral primary osteoarthritis, left knee: Secondary | ICD-10-CM | POA: Diagnosis not present

## 2019-04-14 DIAGNOSIS — F410 Panic disorder [episodic paroxysmal anxiety] without agoraphobia: Secondary | ICD-10-CM | POA: Diagnosis not present

## 2019-04-14 DIAGNOSIS — F29 Unspecified psychosis not due to a substance or known physiological condition: Secondary | ICD-10-CM | POA: Diagnosis not present

## 2019-04-14 DIAGNOSIS — M1712 Unilateral primary osteoarthritis, left knee: Secondary | ICD-10-CM | POA: Diagnosis not present

## 2019-04-18 ENCOUNTER — Ambulatory Visit: Payer: Medicare Other | Admitting: Psychiatry

## 2019-04-18 ENCOUNTER — Encounter

## 2019-04-19 DIAGNOSIS — R202 Paresthesia of skin: Secondary | ICD-10-CM | POA: Diagnosis not present

## 2019-04-19 DIAGNOSIS — M5416 Radiculopathy, lumbar region: Secondary | ICD-10-CM | POA: Diagnosis not present

## 2019-04-19 DIAGNOSIS — R200 Anesthesia of skin: Secondary | ICD-10-CM | POA: Diagnosis not present

## 2019-04-19 DIAGNOSIS — M545 Low back pain: Secondary | ICD-10-CM | POA: Diagnosis not present

## 2019-04-19 DIAGNOSIS — G20 Parkinson's disease: Secondary | ICD-10-CM | POA: Diagnosis not present

## 2019-04-19 DIAGNOSIS — G4752 REM sleep behavior disorder: Secondary | ICD-10-CM | POA: Diagnosis not present

## 2019-04-21 DIAGNOSIS — R252 Cramp and spasm: Secondary | ICD-10-CM | POA: Diagnosis not present

## 2019-04-21 DIAGNOSIS — G4752 REM sleep behavior disorder: Secondary | ICD-10-CM | POA: Diagnosis not present

## 2019-04-21 DIAGNOSIS — R2681 Unsteadiness on feet: Secondary | ICD-10-CM | POA: Diagnosis not present

## 2019-04-21 DIAGNOSIS — M545 Low back pain: Secondary | ICD-10-CM | POA: Diagnosis not present

## 2019-04-21 DIAGNOSIS — F419 Anxiety disorder, unspecified: Secondary | ICD-10-CM | POA: Diagnosis not present

## 2019-04-21 DIAGNOSIS — G8929 Other chronic pain: Secondary | ICD-10-CM | POA: Diagnosis not present

## 2019-04-21 DIAGNOSIS — G20 Parkinson's disease: Secondary | ICD-10-CM | POA: Diagnosis not present

## 2019-04-23 ENCOUNTER — Ambulatory Visit
Admission: RE | Admit: 2019-04-23 | Discharge: 2019-04-23 | Disposition: A | Discharge status: Home / Self Care | Payer: Medicare Other | Source: Ambulatory Visit | Attending: Physician Assistant | Admitting: Physician Assistant

## 2019-04-23 DIAGNOSIS — S83282A Other tear of lateral meniscus, current injury, left knee, initial encounter: Secondary | ICD-10-CM | POA: Diagnosis not present

## 2019-04-23 DIAGNOSIS — M1712 Unilateral primary osteoarthritis, left knee: Secondary | ICD-10-CM | POA: Diagnosis not present

## 2019-04-23 DIAGNOSIS — S83242A Other tear of medial meniscus, current injury, left knee, initial encounter: Secondary | ICD-10-CM | POA: Diagnosis not present

## 2019-04-24 DIAGNOSIS — G20 Parkinson's disease: Secondary | ICD-10-CM | POA: Diagnosis not present

## 2019-04-24 DIAGNOSIS — F419 Anxiety disorder, unspecified: Secondary | ICD-10-CM | POA: Diagnosis not present

## 2019-04-24 DIAGNOSIS — G8929 Other chronic pain: Secondary | ICD-10-CM | POA: Diagnosis not present

## 2019-04-24 DIAGNOSIS — M545 Low back pain: Secondary | ICD-10-CM | POA: Diagnosis not present

## 2019-04-24 DIAGNOSIS — G4752 REM sleep behavior disorder: Secondary | ICD-10-CM | POA: Diagnosis not present

## 2019-04-24 DIAGNOSIS — R252 Cramp and spasm: Secondary | ICD-10-CM | POA: Diagnosis not present

## 2019-04-25 ENCOUNTER — Telehealth: Payer: Self-pay | Admitting: Internal Medicine

## 2019-04-25 DIAGNOSIS — Z8572 Personal history of non-Hodgkin lymphomas: Secondary | ICD-10-CM | POA: Diagnosis not present

## 2019-04-25 DIAGNOSIS — S80211D Abrasion, right knee, subsequent encounter: Secondary | ICD-10-CM | POA: Diagnosis not present

## 2019-04-25 DIAGNOSIS — M1712 Unilateral primary osteoarthritis, left knee: Secondary | ICD-10-CM | POA: Diagnosis not present

## 2019-04-25 NOTE — Telephone Encounter (Signed)
Spoke to Dr. Anda Latina.  Question new lump in the neck.  Recommend getting the PET scan sooner.  [facility]-please move up PET scan [previously ordered] to next week if possible; follow-up with me 1 to 2 days later.

## 2019-04-26 ENCOUNTER — Other Ambulatory Visit: Payer: Self-pay | Admitting: Orthopedic Surgery

## 2019-04-26 DIAGNOSIS — G4752 REM sleep behavior disorder: Secondary | ICD-10-CM | POA: Diagnosis not present

## 2019-04-26 DIAGNOSIS — M545 Low back pain: Secondary | ICD-10-CM | POA: Diagnosis not present

## 2019-04-26 DIAGNOSIS — F419 Anxiety disorder, unspecified: Secondary | ICD-10-CM | POA: Diagnosis not present

## 2019-04-26 DIAGNOSIS — R252 Cramp and spasm: Secondary | ICD-10-CM | POA: Diagnosis not present

## 2019-04-26 DIAGNOSIS — G20 Parkinson's disease: Secondary | ICD-10-CM | POA: Diagnosis not present

## 2019-04-26 DIAGNOSIS — G8929 Other chronic pain: Secondary | ICD-10-CM | POA: Diagnosis not present

## 2019-04-27 DIAGNOSIS — F419 Anxiety disorder, unspecified: Secondary | ICD-10-CM | POA: Diagnosis not present

## 2019-04-27 DIAGNOSIS — R252 Cramp and spasm: Secondary | ICD-10-CM | POA: Diagnosis not present

## 2019-04-27 DIAGNOSIS — G20 Parkinson's disease: Secondary | ICD-10-CM | POA: Diagnosis not present

## 2019-04-27 DIAGNOSIS — M545 Low back pain: Secondary | ICD-10-CM | POA: Diagnosis not present

## 2019-04-27 DIAGNOSIS — M1710 Unilateral primary osteoarthritis, unspecified knee: Secondary | ICD-10-CM | POA: Insufficient documentation

## 2019-04-27 DIAGNOSIS — M179 Osteoarthritis of knee, unspecified: Secondary | ICD-10-CM | POA: Insufficient documentation

## 2019-04-27 DIAGNOSIS — G8929 Other chronic pain: Secondary | ICD-10-CM | POA: Diagnosis not present

## 2019-04-27 DIAGNOSIS — G4752 REM sleep behavior disorder: Secondary | ICD-10-CM | POA: Diagnosis not present

## 2019-04-28 DIAGNOSIS — M545 Low back pain: Secondary | ICD-10-CM | POA: Diagnosis not present

## 2019-04-28 DIAGNOSIS — F29 Unspecified psychosis not due to a substance or known physiological condition: Secondary | ICD-10-CM | POA: Diagnosis not present

## 2019-04-28 DIAGNOSIS — G20 Parkinson's disease: Secondary | ICD-10-CM | POA: Diagnosis not present

## 2019-04-28 DIAGNOSIS — R252 Cramp and spasm: Secondary | ICD-10-CM | POA: Diagnosis not present

## 2019-04-28 DIAGNOSIS — F410 Panic disorder [episodic paroxysmal anxiety] without agoraphobia: Secondary | ICD-10-CM | POA: Diagnosis not present

## 2019-04-28 DIAGNOSIS — F329 Major depressive disorder, single episode, unspecified: Secondary | ICD-10-CM | POA: Diagnosis not present

## 2019-04-28 DIAGNOSIS — G8929 Other chronic pain: Secondary | ICD-10-CM | POA: Diagnosis not present

## 2019-04-28 DIAGNOSIS — F419 Anxiety disorder, unspecified: Secondary | ICD-10-CM | POA: Diagnosis not present

## 2019-04-28 DIAGNOSIS — G4752 REM sleep behavior disorder: Secondary | ICD-10-CM | POA: Diagnosis not present

## 2019-05-01 ENCOUNTER — Ambulatory Visit
Admission: RE | Admit: 2019-05-01 | Discharge: 2019-05-01 | Disposition: A | Discharge status: Home / Self Care | Payer: Medicare Other | Source: Ambulatory Visit | Attending: Internal Medicine | Admitting: Internal Medicine

## 2019-05-01 ENCOUNTER — Other Ambulatory Visit: Payer: Self-pay

## 2019-05-01 DIAGNOSIS — C8299 Follicular lymphoma, unspecified, extranodal and solid organ sites: Secondary | ICD-10-CM | POA: Diagnosis not present

## 2019-05-01 DIAGNOSIS — C8290 Follicular lymphoma, unspecified, unspecified site: Secondary | ICD-10-CM | POA: Diagnosis not present

## 2019-05-01 LAB — GLUCOSE, CAPILLARY: Glucose-Capillary: 82 mg/dL (ref 70–99)

## 2019-05-01 MED ORDER — FLUDEOXYGLUCOSE F - 18 (FDG) INJECTION
7.8000 | Freq: Once | INTRAVENOUS | Status: AC | PRN
  Administered 2019-05-01: 7.93 via INTRAVENOUS

## 2019-05-02 ENCOUNTER — Inpatient Hospital Stay (HOSPITAL_BASED_OUTPATIENT_CLINIC_OR_DEPARTMENT_OTHER): Payer: Medicare Other | Admitting: Internal Medicine

## 2019-05-02 ENCOUNTER — Other Ambulatory Visit: Payer: Self-pay

## 2019-05-02 ENCOUNTER — Inpatient Hospital Stay: Payer: Medicare Other | Attending: Internal Medicine

## 2019-05-02 ENCOUNTER — Encounter: Payer: Self-pay | Admitting: Internal Medicine

## 2019-05-02 DIAGNOSIS — C8299 Follicular lymphoma, unspecified, extranodal and solid organ sites: Secondary | ICD-10-CM

## 2019-05-02 DIAGNOSIS — G20 Parkinson's disease: Secondary | ICD-10-CM | POA: Insufficient documentation

## 2019-05-02 DIAGNOSIS — Z79899 Other long term (current) drug therapy: Secondary | ICD-10-CM | POA: Diagnosis not present

## 2019-05-02 DIAGNOSIS — E785 Hyperlipidemia, unspecified: Secondary | ICD-10-CM | POA: Diagnosis not present

## 2019-05-02 DIAGNOSIS — C8290 Follicular lymphoma, unspecified, unspecified site: Secondary | ICD-10-CM | POA: Insufficient documentation

## 2019-05-02 DIAGNOSIS — Z791 Long term (current) use of non-steroidal anti-inflammatories (NSAID): Secondary | ICD-10-CM | POA: Diagnosis not present

## 2019-05-02 DIAGNOSIS — Z87891 Personal history of nicotine dependence: Secondary | ICD-10-CM | POA: Insufficient documentation

## 2019-05-02 LAB — CBC WITH DIFFERENTIAL/PLATELET
Abs Immature Granulocytes: 0.02 10*3/uL (ref 0.00–0.07)
Basophils Absolute: 0 10*3/uL (ref 0.0–0.1)
Basophils Relative: 0 %
Eosinophils Absolute: 0.1 10*3/uL (ref 0.0–0.5)
Eosinophils Relative: 2 %
HCT: 43 % (ref 36.0–46.0)
Hemoglobin: 13.4 g/dL (ref 12.0–15.0)
Immature Granulocytes: 0 %
Lymphocytes Relative: 22 %
Lymphs Abs: 1.6 10*3/uL (ref 0.7–4.0)
MCH: 28 pg (ref 26.0–34.0)
MCHC: 31.2 g/dL (ref 30.0–36.0)
MCV: 90 fL (ref 80.0–100.0)
Monocytes Absolute: 0.5 10*3/uL (ref 0.1–1.0)
Monocytes Relative: 7 %
Neutro Abs: 5.2 10*3/uL (ref 1.7–7.7)
Neutrophils Relative %: 69 %
Platelets: 214 10*3/uL (ref 150–400)
RBC: 4.78 MIL/uL (ref 3.87–5.11)
RDW: 15 % (ref 11.5–15.5)
WBC: 7.5 10*3/uL (ref 4.0–10.5)
nRBC: 0 % (ref 0.0–0.2)

## 2019-05-02 LAB — COMPREHENSIVE METABOLIC PANEL
ALT: 8 U/L (ref 0–44)
AST: 16 U/L (ref 15–41)
Albumin: 4.1 g/dL (ref 3.5–5.0)
Alkaline Phosphatase: 86 U/L (ref 38–126)
Anion gap: 11 (ref 5–15)
BUN: 29 mg/dL — ABNORMAL HIGH (ref 8–23)
CO2: 25 mmol/L (ref 22–32)
Calcium: 8.9 mg/dL (ref 8.9–10.3)
Chloride: 107 mmol/L (ref 98–111)
Creatinine, Ser: 0.6 mg/dL (ref 0.44–1.00)
GFR calc Af Amer: >60 mL/min (ref >60)
GFR calc non Af Amer: >60 mL/min (ref >60)
Glucose, Bld: 102 mg/dL — ABNORMAL HIGH (ref 70–99)
Potassium: 3.8 mmol/L (ref 3.5–5.1)
Sodium: 143 mmol/L (ref 135–145)
Total Bilirubin: 0.7 mg/dL (ref 0.3–1.2)
Total Protein: 6.8 g/dL (ref 6.5–8.1)

## 2019-05-02 LAB — LACTATE DEHYDROGENASE: LDH: 129 U/L (ref 98–192)

## 2019-05-02 NOTE — Progress Notes (Signed)
[place] OFFICE PROGRESS NOTE  Patient Care Team: Cletis Athens, MD as PCP - General (Cardiology)  Cancer Staging No matching staging information was found for the patient.   Oncology History Overview Note  1. Superficial Protidectomy (right side) December 17/2013.  Consistent with low-grade CD10 and [id number] to follicular lymphoma grade 1-2.  Is positive for CD19 and cough of light chain.bone marrow aspiration and biopsy is negative .  PET scan was positive for some increased uptake in the spleen; Stage IVA disease PET scan is consistent with bilateral neck lymph node (December, 2013) 2. Weekly rituxan has been started.  August 24, 2012 3. Patient finished the last dose of Rituxan on September 14 2012  #  maintenance rituximab since April of 2014 patient has finished rituximabin December of 2015  # PET MARCH 2017 -Splenic recurrence; CT images stable. 4th October 2017- PET - NED.   # chronic back pain/PN/ Parkinsons [Dr.Potter] -----------------------------------------------   DIAGNOSIS: FOLLICULAR LYMPHOMA  STAGE:  IV       ;GOALS: control  CURRENT/MOST RECENT THERAPY: surveillaince    Follicular lymphoma of extranodal and solid organ sites [facility])      INTERVAL HISTORY:  Sarah Donaldson 68 y.o.  female [place] patient above history of Low-grade non-Hodgkin's lymphoma is here for follow-up/review the results of the PET scan.  In the interim patient was recently evaluated by Dr. Anda Latina ENT for prior history of left thyroid nodule.  Family had some concerns about the right-sided neck mass.  Patient denies lumps or bumps chest pain or shortness of cough.   Patient has chronic joint pains back pain.  Patient states that she will be needing the left knee placement.  Chronic fatigue.  Review of Systems  Constitutional: Positive for malaise/fatigue. Negative for chills, diaphoresis, fever and weight loss.  HENT: Negative for nosebleeds and sore  throat.   Eyes: Negative for double vision.  Respiratory: Negative for cough, hemoptysis, sputum production, shortness of breath and wheezing.   Cardiovascular: Negative for chest pain, palpitations, orthopnea and leg swelling.  Gastrointestinal: Negative for abdominal pain, blood in stool, constipation, diarrhea, heartburn, melena, nausea and vomiting.  Genitourinary: Negative for dysuria, frequency and urgency.  Musculoskeletal: Positive for back pain and joint pain.  Skin: Negative.  Negative for itching and rash.  Neurological: Positive for dizziness and tremors (Chronic and attributed to Parkinson's.). Negative for tingling, focal weakness, weakness and headaches.  Endo/Heme/Allergies: Does not bruise/bleed easily.  Psychiatric/Behavioral: Negative for depression. The patient is not nervous/anxious and does not have insomnia.      PAST MEDICAL HISTORY :  Past Medical History:  Diagnosis Date  . Cancer ([place])    Non-hodgkin's lymphoma (in remission)  . Hyperlipidemia   . Knee joint cyst, left 02/07/2018  . Parkinson disease ([place])   . Parkinson disease ([place])     PAST SURGICAL HISTORY :   Past Surgical History:  Procedure Laterality Date  . BACK SURGERY    . BUNIONECTOMY    . NASAL SEPTUM SURGERY    . RETINAL DETACHMENT SURGERY      FAMILY HISTORY :   Family History  Problem Relation Age of Onset  . Heart disease Mother   . Alcohol abuse Father   . Breast cancer Neg Hx   . Mental illness Neg Hx     SOCIAL HISTORY:   Social History   Tobacco Use  . Smoking status: Former Research scientist (life sciences)  . Smokeless tobacco: Never Used  Substance Use Topics  .  Alcohol use: Yes    Alcohol/week: 0.0 standard drinks  . Drug use: No    ALLERGIES:  is allergic to codeine sulfate; gabapentin; and terazosin.  MEDICATIONS:  Current Outpatient Medications  Medication Sig Dispense Refill  . acetaminophen (TYLENOL) 650 MG CR tablet Take 650 mg by mouth 2 (two) times daily.    .  carbidopa-levodopa (SINEMET IR) 25-100 MG tablet TAKE 2 TABLETS BY MOUTH 5 (FIVE) TIMES DAILY FOR 90 DAYS    . clonazePAM (KLONOPIN) 2 MG tablet Take 1-3 mg by mouth See admin instructions. 1 mg every morning and 3 mg at bedtime    . diclofenac (VOLTAREN) 50 MG EC tablet Take 50 mg by mouth 2 (two) times daily.    . furosemide (LASIX) 20 MG tablet Take 20 mg by mouth 2 (two) times a week.     . neomycin-polymyxin b-dexamethasone (MAXITROL) [id number] OINT 1 application.    Marland Kitchen omeprazole (PRILOSEC) 20 MG capsule Take 1 capsule (20 mg total) by mouth daily. 90 capsule 0  . prednisoLONE acetate (PRED FORTE) 1 % ophthalmic suspension Place 1 drop into the left eye daily.  5  . pregabalin (LYRICA) 50 MG capsule Take 1 capsule by mouth daily.    . QUEtiapine (SEROQUEL) 50 MG tablet Take 1 tablet (50 mg total) by mouth at bedtime. 30 tablet 1  . selegiline (ELDEPRYL) 5 MG capsule Take 5 mg by mouth 2 (two) times daily with breakfast and lunch.   1  . Sodium Fluoride (CLINPRO 5000) 1.1 % PSTE Clinpro 5000 1.1 % dental paste  BRUSH TEETH AT BEDTIME AFTER FLOSSING THEN DO NOT RINSE    . traZODone (DESYREL) 50 MG tablet TAKE ONE AND A HALF TABLETS AT BEDTIME FOR SLEEP. TAKE 1/2 TABLET IF YOU WAKE DURING THE NIGHT.    Marland Kitchen triamcinolone cream (KENALOG) 0.1 % APPLY TO AFFECTED AREA (BITES) TWICE A DAY FOR 2 WEEKS. AVOID FACE/GROIN/ARMPIT     No current facility-administered medications for this visit.     PHYSICAL EXAMINATION: ECOG PERFORMANCE STATUS: 0 - Asymptomatic  BP 107/75   Pulse 86   Temp (!) 97.3 F (36.3 C) (Tympanic)   Resp 20   Ht 5\' 6"  (1.676 m)   Wt 153 lb (69.4 kg)   BMI 24.69 kg/m   Filed Weights   05/02/19 1327  Weight: 153 lb (69.4 kg)    Physical Exam  Constitutional: She is oriented to person, place, and time and well-developed, well-nourished, and in no distress.  She is in a wheelchair.  HENT:  Head: Normocephalic and atraumatic.  Mouth/Throat: Oropharynx is clear and  moist. No oropharyngeal exudate.  Eyes: Pupils are equal, round, and reactive to light.  Neck: Normal range of motion. Neck supple.  Cardiovascular: Normal rate and regular rhythm.  Pulmonary/Chest: Effort normal and breath sounds normal. No respiratory distress. She has no wheezes.  Abdominal: Soft. Bowel sounds are normal. She exhibits no distension and no mass. There is no abdominal tenderness. There is no rebound and no guarding.  Musculoskeletal: Normal range of motion.        General: No tenderness or edema.     Comments: 2 x 2 centimeter lump noted in the left upper thigh (lipoma) nontender.  Neurological: She is alert and oriented to person, place, and time.  Skin: Skin is warm.  Psychiatric: Affect normal.    LABORATORY DATA:  I have reviewed the data as listed    Component Value Date/Time   NA 143 05/02/2019 1306  NA 139 12/03/2014 1038   K 3.8 05/02/2019 1306   K 3.6 12/03/2014 1038   CL 107 05/02/2019 1306   CL 106 12/03/2014 1038   CO2 25 05/02/2019 1306   CO2 28 12/03/2014 1038   GLUCOSE 102 (H) 05/02/2019 1306   GLUCOSE 94 12/03/2014 1038   BUN 29 (H) 05/02/2019 1306   BUN 21 (H) 12/03/2014 1038   CREATININE 0.60 05/02/2019 1306   CREATININE 0.65 12/03/2014 1038   CALCIUM 8.9 05/02/2019 1306   CALCIUM 9.1 12/03/2014 1038   PROT 6.8 05/02/2019 1306   PROT 6.9 12/03/2014 1038   ALBUMIN 4.1 05/02/2019 1306   ALBUMIN 4.2 12/03/2014 1038   AST 16 05/02/2019 1306   AST 19 12/03/2014 1038   ALT 8 05/02/2019 1306   ALT < 5 (L) 12/03/2014 1038   ALKPHOS 86 05/02/2019 1306   ALKPHOS 89 12/03/2014 1038   BILITOT 0.7 05/02/2019 1306   BILITOT 0.8 12/03/2014 1038   GFRNONAA >60 05/02/2019 1306   GFRNONAA >60 12/03/2014 1038   GFRAA >60 05/02/2019 1306   GFRAA >60 12/03/2014 1038    No results found for: SPEP, UPEP  Lab Results  Component Value Date   WBC 7.5 05/02/2019   NEUTROABS 5.2 05/02/2019   HGB 13.4 05/02/2019   HCT 43.0 05/02/2019   MCV 90.0  05/02/2019   PLT 214 05/02/2019      Chemistry      Component Value Date/Time   NA 143 05/02/2019 1306   NA 139 12/03/2014 1038   K 3.8 05/02/2019 1306   K 3.6 12/03/2014 1038   CL 107 05/02/2019 1306   CL 106 12/03/2014 1038   CO2 25 05/02/2019 1306   CO2 28 12/03/2014 1038   BUN 29 (H) 05/02/2019 1306   BUN 21 (H) 12/03/2014 1038   CREATININE 0.60 05/02/2019 1306   CREATININE 0.65 12/03/2014 1038      Component Value Date/Time   CALCIUM 8.9 05/02/2019 1306   CALCIUM 9.1 12/03/2014 1038   ALKPHOS 86 05/02/2019 1306   ALKPHOS 89 12/03/2014 1038   AST 16 05/02/2019 1306   AST 19 12/03/2014 1038   ALT 8 05/02/2019 1306   ALT < 5 (L) 12/03/2014 1038   BILITOT 0.7 05/02/2019 1306   BILITOT 0.8 12/03/2014 1038      RADIOGRAPHIC STUDIES: I have personally reviewed the radiological images as listed and agreed with the findings in the report. Nm Pet Image Restag (ps) Skull Base To Thigh  Result Date: 05/01/2019 CLINICAL DATA:  Subsequent treatment strategy for follicular lymphoma. EXAM: NUCLEAR MEDICINE PET SKULL BASE TO THIGH TECHNIQUE: 7.9 mCi F-18 FDG was injected intravenously. Full-ring PET imaging was performed from the skull base to thigh after the radiotracer. CT data was obtained and used for attenuation correction and anatomic localization. Fasting blood glucose: 82 mg/dl COMPARISON:  05/18/2018 FINDINGS: Mediastinal blood pool activity: SUV max 2.3 Liver activity: SUV max NA NECK: No hypermetabolic lymph nodes in the neck. Incidental CT findings: Stable enlargement left thyroid lobe without hypermetabolism. CHEST: No hypermetabolic mediastinal or hilar nodes. No suspicious pulmonary nodules on the CT scan. Incidental CT findings: Coronary artery calcification is evident. Atherosclerotic calcification is noted in the wall of the thoracic aorta. Compressive atelectasis noted both lower lobes. ABDOMEN/PELVIS: No abnormal hypermetabolic activity within the liver, pancreas,  adrenal glands, or spleen. No hypermetabolic lymph nodes in the abdomen or pelvis. Incidental CT findings: Moderate hiatal hernia. Diffuse colonic diverticulosis without diverticulitis. Small left groin hernia contains  only fat. SKELETON: No focal hypermetabolic activity to suggest skeletal metastasis. Incidental CT findings: none IMPRESSION: 1. Stable exam. No new hypermetabolic disease to suggest lymphoma recurrence. 2. No splenic hypermetabolism. 3. Stable asymmetric enlargement left thyroid lobe without hypermetabolism. 4. Moderate hiatal hernia. 5. Diffuse colonic diverticulosis without diverticulitis. Electronically Signed   By: Misty Stanley M.D.   On: 05/01/2019 15:38     ASSESSMENT & PLAN:  Follicular lymphoma of extranodal and solid organ sites [facility]) #Stage IV low-grade follicular lymphoma; involving the spleen. Rituxan maintenance last December 2015.  Currently on surveillance.  #May 01, 2019-PET scan negative for any recurrence.  Clinically stable.  Continue surveillance at this time.  # Left thyroid nodule status post previous biopsy- followed by Dr.McQueen.  Stable.  # s/p back surgery/ Chronic pain right hip low back/sciatica.stable.  I spoke at length with the patient's family/HUsband- regarding the patient's clinical status/plan of care.  Family agreement.   #  DISPOSITION:  # follow up in 6 months-MD/labs-CBC/CMP/LDH;Dr.B  # I reviewed the blood work- with the patient in detail; also reviewed the imaging independently [as summarized above]; and with the patient in detail.     Orders Placed This Encounter  Procedures  . CBC with Differential    Standing Status:   Future    Standing Expiration Date:   05/01/2020  . Comprehensive metabolic panel    Standing Status:   Future    Standing Expiration Date:   05/01/2020  . Lactate dehydrogenase    Standing Status:   Future    Standing Expiration Date:   05/01/2020  . CBC with Differential    Standing Status:   Future     Standing Expiration Date:   05/01/2020  . Comprehensive metabolic panel    Standing Status:   Future    Standing Expiration Date:   05/01/2020  . Lactate dehydrogenase    Standing Status:   Future    Standing Expiration Date:   05/01/2020   All questions were answered. The patient knows to call the clinic with any problems, questions or concerns.      Cammie Sickle, MD 05/02/2019 4:51 PM

## 2019-05-02 NOTE — Assessment & Plan Note (Addendum)
#  Stage IV low-grade follicular lymphoma; involving the spleen. Rituxan maintenance last December 2015.  Currently on surveillance.  #May 01, 2019-PET scan negative for any recurrence.  Clinically stable.  Continue surveillance at this time.  # Left thyroid nodule status post previous biopsy- followed by Dr.McQueen.  Stable.  # s/p back surgery/ Chronic pain right hip low back/sciatica.stable.  I spoke at length with the patient's family/HUsband- regarding the patient's clinical status/plan of care.  Family agreement.   #  DISPOSITION:  # follow up in 6 months-MD/labs-CBC/CMP/LDH;Dr.B  # I reviewed the blood work- with the patient in detail; also reviewed the imaging independently [as summarized above]; and with the patient in detail.

## 2019-05-03 DIAGNOSIS — M545 Low back pain: Secondary | ICD-10-CM | POA: Diagnosis not present

## 2019-05-03 DIAGNOSIS — G8929 Other chronic pain: Secondary | ICD-10-CM | POA: Diagnosis not present

## 2019-05-03 DIAGNOSIS — R252 Cramp and spasm: Secondary | ICD-10-CM | POA: Diagnosis not present

## 2019-05-03 DIAGNOSIS — G20 Parkinson's disease: Secondary | ICD-10-CM | POA: Diagnosis not present

## 2019-05-03 DIAGNOSIS — F419 Anxiety disorder, unspecified: Secondary | ICD-10-CM | POA: Diagnosis not present

## 2019-05-03 DIAGNOSIS — G4752 REM sleep behavior disorder: Secondary | ICD-10-CM | POA: Diagnosis not present

## 2019-05-04 DIAGNOSIS — G20 Parkinson's disease: Secondary | ICD-10-CM | POA: Diagnosis not present

## 2019-05-04 DIAGNOSIS — R252 Cramp and spasm: Secondary | ICD-10-CM | POA: Diagnosis not present

## 2019-05-04 DIAGNOSIS — M545 Low back pain: Secondary | ICD-10-CM | POA: Diagnosis not present

## 2019-05-04 DIAGNOSIS — F419 Anxiety disorder, unspecified: Secondary | ICD-10-CM | POA: Diagnosis not present

## 2019-05-04 DIAGNOSIS — G8929 Other chronic pain: Secondary | ICD-10-CM | POA: Diagnosis not present

## 2019-05-04 DIAGNOSIS — G4752 REM sleep behavior disorder: Secondary | ICD-10-CM | POA: Diagnosis not present

## 2019-05-05 ENCOUNTER — Ambulatory Visit: Payer: Medicare Other | Admitting: Psychiatry

## 2019-05-05 DIAGNOSIS — M545 Low back pain: Secondary | ICD-10-CM | POA: Diagnosis not present

## 2019-05-05 DIAGNOSIS — G8929 Other chronic pain: Secondary | ICD-10-CM | POA: Diagnosis not present

## 2019-05-05 DIAGNOSIS — R252 Cramp and spasm: Secondary | ICD-10-CM | POA: Diagnosis not present

## 2019-05-05 DIAGNOSIS — G4752 REM sleep behavior disorder: Secondary | ICD-10-CM | POA: Diagnosis not present

## 2019-05-05 DIAGNOSIS — G20 Parkinson's disease: Secondary | ICD-10-CM | POA: Diagnosis not present

## 2019-05-05 DIAGNOSIS — F419 Anxiety disorder, unspecified: Secondary | ICD-10-CM | POA: Diagnosis not present

## 2019-05-08 DIAGNOSIS — M545 Low back pain: Secondary | ICD-10-CM | POA: Diagnosis not present

## 2019-05-08 DIAGNOSIS — G20 Parkinson's disease: Secondary | ICD-10-CM | POA: Diagnosis not present

## 2019-05-08 DIAGNOSIS — G8929 Other chronic pain: Secondary | ICD-10-CM | POA: Diagnosis not present

## 2019-05-08 DIAGNOSIS — R200 Anesthesia of skin: Secondary | ICD-10-CM | POA: Diagnosis not present

## 2019-05-08 DIAGNOSIS — G4752 REM sleep behavior disorder: Secondary | ICD-10-CM | POA: Diagnosis not present

## 2019-05-08 DIAGNOSIS — R252 Cramp and spasm: Secondary | ICD-10-CM | POA: Diagnosis not present

## 2019-05-08 DIAGNOSIS — F419 Anxiety disorder, unspecified: Secondary | ICD-10-CM | POA: Diagnosis not present

## 2019-05-08 DIAGNOSIS — M1712 Unilateral primary osteoarthritis, left knee: Secondary | ICD-10-CM | POA: Diagnosis not present

## 2019-05-08 DIAGNOSIS — R202 Paresthesia of skin: Secondary | ICD-10-CM | POA: Diagnosis not present

## 2019-05-09 DIAGNOSIS — R252 Cramp and spasm: Secondary | ICD-10-CM | POA: Diagnosis not present

## 2019-05-09 DIAGNOSIS — G4752 REM sleep behavior disorder: Secondary | ICD-10-CM | POA: Diagnosis not present

## 2019-05-09 DIAGNOSIS — F419 Anxiety disorder, unspecified: Secondary | ICD-10-CM | POA: Diagnosis not present

## 2019-05-09 DIAGNOSIS — G20 Parkinson's disease: Secondary | ICD-10-CM | POA: Diagnosis not present

## 2019-05-09 DIAGNOSIS — M545 Low back pain: Secondary | ICD-10-CM | POA: Diagnosis not present

## 2019-05-09 DIAGNOSIS — G8929 Other chronic pain: Secondary | ICD-10-CM | POA: Diagnosis not present

## 2019-05-10 DIAGNOSIS — G20 Parkinson's disease: Secondary | ICD-10-CM | POA: Diagnosis not present

## 2019-05-10 DIAGNOSIS — G4752 REM sleep behavior disorder: Secondary | ICD-10-CM | POA: Diagnosis not present

## 2019-05-10 DIAGNOSIS — G8929 Other chronic pain: Secondary | ICD-10-CM | POA: Diagnosis not present

## 2019-05-10 DIAGNOSIS — F419 Anxiety disorder, unspecified: Secondary | ICD-10-CM | POA: Diagnosis not present

## 2019-05-10 DIAGNOSIS — R252 Cramp and spasm: Secondary | ICD-10-CM | POA: Diagnosis not present

## 2019-05-10 DIAGNOSIS — M545 Low back pain: Secondary | ICD-10-CM | POA: Diagnosis not present

## 2019-05-16 ENCOUNTER — Other Ambulatory Visit: Payer: Self-pay | Admitting: Student in an Organized Health Care Education/Training Program

## 2019-05-16 DIAGNOSIS — G4752 REM sleep behavior disorder: Secondary | ICD-10-CM | POA: Diagnosis not present

## 2019-05-16 DIAGNOSIS — M545 Low back pain: Secondary | ICD-10-CM | POA: Diagnosis not present

## 2019-05-16 DIAGNOSIS — F419 Anxiety disorder, unspecified: Secondary | ICD-10-CM | POA: Diagnosis not present

## 2019-05-16 DIAGNOSIS — G8929 Other chronic pain: Secondary | ICD-10-CM | POA: Diagnosis not present

## 2019-05-16 DIAGNOSIS — R252 Cramp and spasm: Secondary | ICD-10-CM | POA: Diagnosis not present

## 2019-05-16 DIAGNOSIS — G20 Parkinson's disease: Secondary | ICD-10-CM | POA: Diagnosis not present

## 2019-05-18 ENCOUNTER — Telehealth: Payer: Self-pay | Admitting: Student in an Organized Health Care Education/Training Program

## 2019-05-18 DIAGNOSIS — F29 Unspecified psychosis not due to a substance or known physiological condition: Secondary | ICD-10-CM | POA: Diagnosis not present

## 2019-05-18 DIAGNOSIS — F329 Major depressive disorder, single episode, unspecified: Secondary | ICD-10-CM | POA: Diagnosis not present

## 2019-05-18 DIAGNOSIS — F410 Panic disorder [episodic paroxysmal anxiety] without agoraphobia: Secondary | ICD-10-CM | POA: Diagnosis not present

## 2019-05-18 NOTE — Telephone Encounter (Signed)
Husband states pateint is in a lot of pain in her back and her knee, very swolen and painful. She is having knee surgery on 06-07-19. Surgeon suggested she call to see if Dr. Holley Raring will write something for pain.

## 2019-05-18 NOTE — Telephone Encounter (Signed)
Patient does not have a pain contract with our, surgeon is free to write for opioids if he prefers.

## 2019-05-18 NOTE — Telephone Encounter (Signed)
Please advise. Last visit 02-22-19.

## 2019-05-19 DIAGNOSIS — G20 Parkinson's disease: Secondary | ICD-10-CM | POA: Diagnosis not present

## 2019-05-19 DIAGNOSIS — R252 Cramp and spasm: Secondary | ICD-10-CM | POA: Diagnosis not present

## 2019-05-19 DIAGNOSIS — G8929 Other chronic pain: Secondary | ICD-10-CM | POA: Diagnosis not present

## 2019-05-19 DIAGNOSIS — M545 Low back pain: Secondary | ICD-10-CM | POA: Diagnosis not present

## 2019-05-19 DIAGNOSIS — G4752 REM sleep behavior disorder: Secondary | ICD-10-CM | POA: Diagnosis not present

## 2019-05-19 DIAGNOSIS — F419 Anxiety disorder, unspecified: Secondary | ICD-10-CM | POA: Diagnosis not present

## 2019-05-22 ENCOUNTER — Encounter: Payer: Self-pay | Admitting: Student in an Organized Health Care Education/Training Program

## 2019-05-22 DIAGNOSIS — M4306 Spondylolysis, lumbar region: Secondary | ICD-10-CM | POA: Diagnosis not present

## 2019-05-22 DIAGNOSIS — Z9181 History of falling: Secondary | ICD-10-CM | POA: Diagnosis not present

## 2019-05-23 ENCOUNTER — Ambulatory Visit
Payer: Medicare Other | Attending: Student in an Organized Health Care Education/Training Program | Admitting: Student in an Organized Health Care Education/Training Program

## 2019-05-23 ENCOUNTER — Encounter: Payer: Self-pay | Admitting: Student in an Organized Health Care Education/Training Program

## 2019-05-23 ENCOUNTER — Other Ambulatory Visit: Payer: Self-pay

## 2019-05-23 DIAGNOSIS — M25562 Pain in left knee: Secondary | ICD-10-CM | POA: Diagnosis not present

## 2019-05-23 DIAGNOSIS — G894 Chronic pain syndrome: Secondary | ICD-10-CM

## 2019-05-23 DIAGNOSIS — G20 Parkinson's disease: Secondary | ICD-10-CM | POA: Diagnosis not present

## 2019-05-23 DIAGNOSIS — R296 Repeated falls: Secondary | ICD-10-CM | POA: Diagnosis not present

## 2019-05-23 DIAGNOSIS — G8929 Other chronic pain: Secondary | ICD-10-CM

## 2019-05-23 DIAGNOSIS — M5441 Lumbago with sciatica, right side: Secondary | ICD-10-CM

## 2019-05-23 DIAGNOSIS — M7918 Myalgia, other site: Secondary | ICD-10-CM | POA: Diagnosis not present

## 2019-05-23 DIAGNOSIS — M5136 Other intervertebral disc degeneration, lumbar region: Secondary | ICD-10-CM

## 2019-05-23 DIAGNOSIS — Z981 Arthrodesis status: Secondary | ICD-10-CM | POA: Diagnosis not present

## 2019-05-23 DIAGNOSIS — M5416 Radiculopathy, lumbar region: Secondary | ICD-10-CM

## 2019-05-23 NOTE — Progress Notes (Signed)
Pain Management Virtual Encounter Note - Virtual Visit via Telephone Telehealth (real-time audio visits between healthcare provider and patient).   Patient's Phone No. & Preferred Pharmacy:  [phone] (home); [phone] (mobile); (Preferred) [phone] [email or web address]  [id number] - , [place] - [address] [place] [address] Phone: [phone] Fax: [phone]  [facility] [place], [place] - [address] [place] [address] Phone: [phone] Fax: [phone]  [id number] [place], [place] - [address] AT [facility] [address] [place] [address] Phone: [phone] Fax: [phone]    Pre-screening note:  Our staff contacted Sarah Donaldson and offered her an "in person", "face-to-face" appointment versus a telephone encounter. She indicated preferring the telephone encounter, at this time.   Reason for Virtual Visit: COVID-19*  Social distancing based on CDC and AMA recommendations.   I contacted Sarah Donaldson on 05/23/2019 via telephone.      I clearly identified myself as Gillis Santa, MD. I verified that I was speaking with the correct person using two identifiers (Name: Sarah Donaldson, and date of birth: 02-Dec-1950).  Advanced Informed Consent I sought verbal advanced consent from Sarah Donaldson for virtual visit interactions. I informed Sarah Donaldson of possible security and privacy concerns, risks, and limitations associated with providing "not-in-person" medical evaluation and management services. I also informed Sarah Donaldson of the availability of "in-person" appointments. Finally, I informed her that there would be a charge for the virtual visit and that she could be  personally, fully or partially, financially responsible for it. Sarah Donaldson expressed understanding and agreed to proceed.   Historic Elements    Sarah Donaldson is a 68 y.o. year old, female patient evaluated today after her last encounter by our practice on 05/18/2019. Sarah Donaldson  has a past medical history of Cancer [facility]), Hyperlipidemia, Knee joint cyst, left (02/07/2018), Parkinson disease ([place]), and Parkinson disease ([place]). She also  has a past surgical history that includes Retinal detachment surgery; Bunionectomy; Nasal septum surgery; and Back surgery. Sarah Donaldson has a current medication list which includes the following prescription(s): acetaminophen, carbidopa-levodopa, clonazepam, diclofenac, furosemide, neomycin-polymyxin b-dexamethasone, omeprazole, prednisolone acetate, pregabalin, quetiapine, clinpro 5000, trazodone, clonazepam, selegiline, and triamcinolone cream. She  reports that she has quit smoking. She has never used smokeless tobacco. She reports current alcohol use. She reports that she does not use drugs. Sarah Donaldson is allergic to codeine sulfate; gabapentin; and terazosin.   HPI  Today, she is being contacted for Perioperative pain management.   Of note, patient is scheduled for left total knee arthroplasty by Dr. Harlow Mares with orthopedics.  The patient is not on any opioid analgesics from me.  Patient's Parkinson's is being managed by neurology by Dr. Melrose Nakayama.  I informed the patient of potential peripheral nerve block that she could request on the day of her surgery to help out with her perioperative knee pain.  This will also minimize her opioid utilization in the postoperative period.  Given that I am not managing the patient on any chronic opioid medications and given that the patient does not have a pain contract with our clinic, I will defer her postoperative pain management to her surgical care team.  She can continue her Parkinson's management with neurology.   Laboratory Chemistry Profile (12 mo)  Renal: 05/02/2019: BUN 29; Creatinine, Ser 0.60  Lab Results  Component Value  Date   GFRAA >60 05/02/2019    GFRNONAA >60 05/02/2019   Hepatic: 05/02/2019: Albumin 4.1 Lab Results  Component Value Date   AST 16 05/02/2019   ALT 8 05/02/2019   Other: No results found for requested labs within last 8760 hours. Note: Above Lab results reviewed.  Imaging  Last 90 days:  Dg Chest 2 View  Result Date: 04/02/2019 CLINICAL DATA:  Shortness of breath EXAM: CHEST - 2 VIEW COMPARISON:  01/05/2019 FINDINGS: Mildly low lung volumes. No focal opacity or pleural effusion. Cardiomediastinal silhouette is within normal limits. No pneumothorax. IMPRESSION: No active cardiopulmonary disease. Electronically Signed   By: Donavan Foil M.D.   On: 04/02/2019 23:25   Mr Lumbar Spine Wo Contrast  Result Date: 04/09/2019 CLINICAL DATA:  Low back pain radiating into both lower extremities EXAM: MRI LUMBAR SPINE WITHOUT CONTRAST TECHNIQUE: Multiplanar, multisequence MR imaging of the lumbar spine was performed. No intravenous contrast was administered. COMPARISON:  Lumbar spine MRI 06/28/2017 FINDINGS: Segmentation:  Normal Alignment: Grade 1 retrolisthesis at L2-3 and L3-4 Vertebrae:  L1-2 PLIF. No acute fracture or focal osseous lesion. Conus medullaris and cauda equina: Conus extends to the L1 level. Conus and cauda equina appear normal. Paraspinal and other soft tissues: Negative Disc levels: T10-11: Normal. T11-12: Normal. T12-L1: Mild disc bulge without spinal canal stenosis or neural foraminal stenosis. L1-L2: PLIF with widely patent spinal canal and no neural foraminal stenosis. L2-L3: Mild disc bulge, unchanged. Unchanged narrowing of both lateral recesses with mild spinal canal stenosis. Severe right and mild left neural foraminal stenosis, unchanged. L3-L4: Left eccentric disc bulge with severe left neural foraminal stenosis, worsened from the prior study. There is also narrowing of the left lateral recess that is unchanged. No right foraminal stenosis. L4-L5: Mild disc bulge with mild left foraminal stenosis, unchanged.  L5-S1: Disc space narrowing with small bulge. Moderate bilateral foraminal stenosis, unchanged. Visualized sacrum: Normal. IMPRESSION: 1. Worsened L3-4 left eccentric disc bulge causing severe left neural foraminal stenosis. 2. Unchanged L2-3 disc bulge with severe right and mild left neural foraminal stenosis. 3. Unchanged L5-S1 moderate bilateral neural foraminal stenosis. Electronically Signed   By: Ulyses Jarred M.D.   On: 04/09/2019 04:07   Mr Knee Left Wo Contrast  Result Date: 04/24/2019 CLINICAL DATA:  Left knee swelling x1 year EXAM: MRI OF THE LEFT KNEE WITHOUT CONTRAST TECHNIQUE: Multiplanar, multisequence MR imaging of the knee was performed. No intravenous contrast was administered. COMPARISON:  None. FINDINGS: MENISCI Medial: There is complex tearing seen throughout the posterior horn of the medial meniscus which appears to be diminutive. This extends to the root attachment, however the root attachment is intact. There is slight extrusion of the mid body. Lateral: There is complex tearing seen throughout the posterior horn of the lateral meniscus which extends to the posterior root junction, however fat appears to be intact. There is also complex tearing extending to the anterior horn root junction where there is a focal tear seen at the root junction. There is extrusion of the mid body. LIGAMENTS Cruciates: The ACL is intact. The PCL is intact. Collaterals: There is increased signal seen around the medial collateral ligament. The lateral collateral ligamentous complex is intact. CARTILAGE Patellofemoral: There is chondral thinning seen within the lateral patellar facet and central patellar apex. Medial compartment: Chondral fissuring seen within the weight-bearing surface of the medial femoral condyle medial tibial plateau with small marginal osteophytes. Lateral compartment: There is deep chondral fissuring seen within the weight-bearing surface of the  lateral femoral condyle with near complete  cartilage loss as well as within the tibial surface with underlying subchondral marrow signal change. BONES: There is increased marrow signal seen throughout the lateral femoral condyle lateral tibial plateau. There is also increased signal seen within the intracondylar notch. No avascular necrosis. No pathologic marrow infiltration. JOINT: A large knee joint effusion is seen. There is edema seen within Hoffa's fat pad. Posterior to the PCL there is a 9 mm T1/T2 dark loose body seen. EXTENSOR MECHANISM: The patellar and quadriceps tendon are intact. The retinaculum is unremarkable. POPLITEAL FOSSA: A large loculated popliteal cyst is seen measuring 4 cm. There is a small amount of fluid seen tracking posteriorly. OTHER: Prepatellar subcutaneous edema is noted. There is also mild edema seen within the popliteus muscle belly. IMPRESSION: 1. Complex tearing of the posterior medial meniscus with extrusion, however the root attachment still appears to be intact. 2. Complex tearing seen throughout the lateral meniscus which extends to the anterior root which appears to be partially disrupted. 3. Intact cruciate ligaments 4. Grade 1 medial collateral ligamentous sprain 5. Tricompartmental osteoarthritis, advanced within the lateral compartment. 6. Reactive marrow/marrow edema seen throughout the lateral femoral condyle and lateral tibial plateau. No definite fracture. 7. 9 mm loose body seen posterior to the PCL 8. Large knee joint effusion. 9. Loculated popliteal cyst with evidence of recent partial rupture Electronically Signed   By: Prudencio Pair M.D.   On: 04/24/2019 10:06   Nm Pet Image Restag (ps) Skull Base To Thigh  Result Date: 05/01/2019 CLINICAL DATA:  Subsequent treatment strategy for follicular lymphoma. EXAM: NUCLEAR MEDICINE PET SKULL BASE TO THIGH TECHNIQUE: 7.9 mCi F-18 FDG was injected intravenously. Full-ring PET imaging was performed from the skull base to thigh after the radiotracer. CT data was  obtained and used for attenuation correction and anatomic localization. Fasting blood glucose: 82 mg/dl COMPARISON:  05/18/2018 FINDINGS: Mediastinal blood pool activity: SUV max 2.3 Liver activity: SUV max NA NECK: No hypermetabolic lymph nodes in the neck. Incidental CT findings: Stable enlargement left thyroid lobe without hypermetabolism. CHEST: No hypermetabolic mediastinal or hilar nodes. No suspicious pulmonary nodules on the CT scan. Incidental CT findings: Coronary artery calcification is evident. Atherosclerotic calcification is noted in the wall of the thoracic aorta. Compressive atelectasis noted both lower lobes. ABDOMEN/PELVIS: No abnormal hypermetabolic activity within the liver, pancreas, adrenal glands, or spleen. No hypermetabolic lymph nodes in the abdomen or pelvis. Incidental CT findings: Moderate hiatal hernia. Diffuse colonic diverticulosis without diverticulitis. Small left groin hernia contains only fat. SKELETON: No focal hypermetabolic activity to suggest skeletal metastasis. Incidental CT findings: none IMPRESSION: 1. Stable exam. No new hypermetabolic disease to suggest lymphoma recurrence. 2. No splenic hypermetabolism. 3. Stable asymmetric enlargement left thyroid lobe without hypermetabolism. 4. Moderate hiatal hernia. 5. Diffuse colonic diverticulosis without diverticulitis. Electronically Signed   By: Misty Stanley M.D.   On: 05/01/2019 15:38    Assessment  The primary encounter diagnosis was Chronic pain syndrome. Diagnoses of Chronic myofascial pain, S/P lumbar fusion, Lumbar radiculopathy, Chronic bilateral low back pain with right-sided sciatica, Multiple falls, Lumbar degenerative disc disease, and Idiopathic Parkinson's disease ([place]) were also pertinent to this visit.  Plan of Care  I am having Sarah Donaldson maintain her selegiline, prednisoLONE acetate, omeprazole, acetaminophen, clonazePAM, neomycin-polymyxin b-dexamethasone, triamcinolone cream, furosemide,  traZODone, carbidopa-levodopa, Clinpro 5000, QUEtiapine, pregabalin, clonazePAM, and diclofenac.   Of note, patient is scheduled for left total knee arthroplasty by Dr. Harlow Mares with orthopedics.  The  patient is not on any opioid analgesics from me.  She does not have a pain contract here.  Patient's Parkinson's is being managed by neurology by Dr. Melrose Nakayama.  I informed the patient of potential peripheral nerve block (femoral) or neuraxial anesthesia (spinal) that she could request on the day of her surgery to help out with her perioperative knee pain.  This will also minimize her opioid utilization in the postoperative period. This will minimize her risk of falls especially since she has gait abnormalities due to Parkinson's.  Given that I am not managing the patient on any chronic opioid medications and given that the patient does not have a pain contract with our clinic, I will defer her postoperative pain management to her surgical care team.  She can continue her Parkinson's management with neurology. Follow up as needed.  Follow-up plan:   Return if symptoms worsen or fail to improve.    Recent Visits Date Type Provider Dept  02/22/19 Office Visit Gillis Santa, MD [facility]  Showing recent visits within past 90 days and meeting all other requirements   Today's Visits Date Type Provider Dept  05/23/19 Office Visit Gillis Santa, MD [facility]  Showing today's visits and meeting all other requirements   Future Appointments No visits were found meeting these conditions.  Showing future appointments within next 90 days and meeting all other requirements   I discussed the assessment and treatment plan with the patient. The patient was provided an opportunity to ask questions and all were answered. The patient agreed with the plan and demonstrated an understanding of the instructions.  Patient advised to call back or seek an in-person evaluation if the symptoms or  condition worsens.  Total duration of non-face-to-face encounter: 21 minutes.  Note by: Gillis Santa, MD Date: 05/23/2019; Time: 3:09 PM  Note: This dictation was prepared with Dragon dictation. Any transcriptional errors that may result from this process are unintentional.  Disclaimer:  * Given the special circumstances of the COVID-19 pandemic, the federal government has announced that the Office for Civil Rights (OCR) will exercise its enforcement discretion and will not impose penalties on physicians using telehealth in the event of noncompliance with regulatory requirements under the [place] (HIPAA) in connection with the good faith provision of telehealth during the XX123456 national public health emergency. ([place])

## 2019-05-25 ENCOUNTER — Telehealth: Payer: Self-pay | Admitting: Student in an Organized Health Care Education/Training Program

## 2019-05-25 ENCOUNTER — Other Ambulatory Visit: Payer: Medicare Other

## 2019-05-25 NOTE — Telephone Encounter (Addendum)
Pharmacy called [phone]  Patient called them requesting refill on pregabalin  Please check if Dr. Holley Raring prescribes this medication

## 2019-05-26 ENCOUNTER — Ambulatory Visit: Payer: Medicare Other | Admitting: Internal Medicine

## 2019-05-26 ENCOUNTER — Other Ambulatory Visit: Payer: Medicare Other

## 2019-05-26 ENCOUNTER — Telehealth: Payer: Self-pay | Admitting: *Deleted

## 2019-05-26 NOTE — Telephone Encounter (Signed)
Sorry Dr. Holley Raring, I ment to send to nurses.

## 2019-05-26 NOTE — Telephone Encounter (Signed)
Returned call to pharmacy.  They are asking about Lyrica.  I can see Lyrica 75 mg from 04/23/19 from Historical provider.  Then back in February 22, 2019 within the office visit note I can see Lyrica 50 mg bid and if tolerated increase to tid.  Pharmacy states they never received that.  She ask for a verbal and I declined to do so because the information is unclear to me.  Will f/up with Dr Holley Raring on Monday.

## 2019-05-29 DIAGNOSIS — L821 Other seborrheic keratosis: Secondary | ICD-10-CM | POA: Diagnosis not present

## 2019-05-29 DIAGNOSIS — L578 Other skin changes due to chronic exposure to nonionizing radiation: Secondary | ICD-10-CM | POA: Diagnosis not present

## 2019-05-29 DIAGNOSIS — L814 Other melanin hyperpigmentation: Secondary | ICD-10-CM | POA: Diagnosis not present

## 2019-05-29 DIAGNOSIS — L570 Actinic keratosis: Secondary | ICD-10-CM | POA: Diagnosis not present

## 2019-05-29 DIAGNOSIS — Z1283 Encounter for screening for malignant neoplasm of skin: Secondary | ICD-10-CM | POA: Diagnosis not present

## 2019-05-29 DIAGNOSIS — D692 Other nonthrombocytopenic purpura: Secondary | ICD-10-CM | POA: Diagnosis not present

## 2019-05-29 MED ORDER — PREGABALIN 50 MG PO CAPS: ORAL_CAPSULE | ORAL | 2 refills | Status: DC

## 2019-05-29 NOTE — Telephone Encounter (Signed)
Lyrica 50 mg BID (patient can also take TID prn if able to tolerate), called in  Requested Prescriptions   Signed Prescriptions Disp Refills  . pregabalin (LYRICA) 50 MG capsule 90 capsule 2    Sig: BID to TID prn    Authorizing Provider: Gillis Santa

## 2019-05-29 NOTE — Telephone Encounter (Signed)
Patient notified

## 2019-05-29 NOTE — Telephone Encounter (Signed)
Mediation sent

## 2019-05-30 DIAGNOSIS — G4752 REM sleep behavior disorder: Secondary | ICD-10-CM | POA: Diagnosis not present

## 2019-05-30 DIAGNOSIS — G20 Parkinson's disease: Secondary | ICD-10-CM | POA: Diagnosis not present

## 2019-05-31 ENCOUNTER — Encounter
Admission: RE | Admit: 2019-05-31 | Discharge: 2019-05-31 | Disposition: A | Discharge status: Home / Self Care | Payer: Medicare Other | Source: Ambulatory Visit | Attending: Orthopedic Surgery | Admitting: Orthopedic Surgery

## 2019-05-31 ENCOUNTER — Other Ambulatory Visit: Payer: Self-pay

## 2019-05-31 DIAGNOSIS — Z01812 Encounter for preprocedural laboratory examination: Secondary | ICD-10-CM | POA: Diagnosis not present

## 2019-05-31 HISTORY — DX: Gastro-esophageal reflux disease without esophagitis: K21.9

## 2019-05-31 LAB — URINALYSIS, ROUTINE W REFLEX MICROSCOPIC
Bacteria, UA: NONE SEEN
Bilirubin Urine: NEGATIVE
Glucose, UA: NEGATIVE mg/dL
Hgb urine dipstick: NEGATIVE
Ketones, ur: 5 mg/dL — AB
Nitrite: NEGATIVE
Protein, ur: NEGATIVE mg/dL
Specific Gravity, Urine: 1.038 — ABNORMAL HIGH (ref 1.005–1.030)
pH: 5 (ref 5.0–8.0)

## 2019-05-31 LAB — BASIC METABOLIC PANEL
Anion gap: 8 (ref 5–15)
BUN: 34 mg/dL — ABNORMAL HIGH (ref 8–23)
CO2: 31 mmol/L (ref 22–32)
Calcium: 9.7 mg/dL (ref 8.9–10.3)
Chloride: 105 mmol/L (ref 98–111)
Creatinine, Ser: 0.45 mg/dL (ref 0.44–1.00)
GFR calc Af Amer: >60 mL/min (ref >60)
GFR calc non Af Amer: >60 mL/min (ref >60)
Glucose, Bld: 95 mg/dL (ref 70–99)
Potassium: 4.7 mmol/L (ref 3.5–5.1)
Sodium: 144 mmol/L (ref 135–145)

## 2019-05-31 LAB — TYPE AND SCREEN
ABO/RH(D): O POS
Antibody Screen: NEGATIVE

## 2019-05-31 LAB — CBC
HCT: 42.1 % (ref 36.0–46.0)
Hemoglobin: 13.2 g/dL (ref 12.0–15.0)
MCH: 28.5 pg (ref 26.0–34.0)
MCHC: 31.4 g/dL (ref 30.0–36.0)
MCV: 90.9 fL (ref 80.0–100.0)
Platelets: 229 10*3/uL (ref 150–400)
RBC: 4.63 MIL/uL (ref 3.87–5.11)
RDW: 14.7 % (ref 11.5–15.5)
WBC: 7.5 10*3/uL (ref 4.0–10.5)
nRBC: 0 % (ref 0.0–0.2)

## 2019-05-31 LAB — SURGICAL PCR SCREEN
MRSA, PCR: NEGATIVE
Staphylococcus aureus: NEGATIVE

## 2019-05-31 LAB — APTT: aPTT: 26 seconds (ref 24–36)

## 2019-05-31 LAB — PROTIME-INR
INR: 0.9 (ref 0.8–1.2)
Prothrombin Time: 12.1 seconds (ref 11.4–15.2)

## 2019-05-31 NOTE — Patient Instructions (Signed)
Your COVID swab is scheduled on Friday June 02, 2019.  Drive up any time [id number] am in front of the [place] and remain in your vehicle.  Your surgery is scheduled on: Wednesday June 07, 2019 Report to Same Day Surgery 2nd floor [facility] Entrance-take elevator on left to 2nd floor.  Check in with surgery information desk.) To find out your arrival time, call [phone] 1:00-3:00 PM on Tuesday June 06, 2019  Remember: Instructions that are not followed completely may result in serious medical risk, up to and including death, or upon the discretion of your surgeon and anesthesiologist your surgery may need to be rescheduled.    __x__ 1. Do not eat food (including mints, candies, chewing gum) after midnight the night before your procedure. You may drink clear liquids up to 2 hours before you are scheduled to arrive at the hospital for your procedure.  Do not drink anything within 2 hours of your scheduled arrival to the hospital.  Approved clear liquids:  --Water or Apple juice without pulp  --Clear carbohydrate beverage such as Gatorade or Powerade  --Black Coffee or Clear Tea (No milk, no creamers, do not add anything to the coffee or tea)  Finish your Ensure carbohydrate drink 2 hours before your arrival to the hospital.    __x__ 2. No Alcohol for 24 hours before or after surgery.   __x__ 3. No Smoking or e-cigarettes for 24 hours before surgery.  Do not use any chewable tobacco products for at least 6 hours before surgery.   __x__ 4. Notify your doctor if there is any change in your medical condition (cold, fever, infections).   __x__ 5. On the morning of surgery brush your teeth with toothpaste and water.  You may rinse your mouth with mouthwash if you wish.  Do not swallow any toothpaste or mouthwash.  Please read over the following fact sheets that you were given:   [facility] Preparing for Surgery and/or MRSA  Information    __x__ Use CHG Soap as directed on instruction sheet.   Do not wear jewelry, make-up, hairpins, clips or nail polish on the day of surgery.  Do not wear lotions, powders, deodorant, or perfumes.   Do not shave below the face/neck 48 hours prior to surgery.   Do not bring valuables to the hospital.    [facility] is not responsible for any belongings or valuables.               Contacts, dentures or bridgework may not be worn into surgery.  For patients admitted to the hospital, discharge time is determined by your treatment team.   __x__ Take these medicines on the morning of surgery with a SMALL SIP OF WATER ONLY:  1. Carbidopa-Levodopa (Sinemet)  2. Clonazepam (Klonopin)  3. Omeprazole (Prilosec)  4. Pregabalin (Lyrica)  5. Tylenol if needed   Do not take any Furosemide (Lasix) on the day of surgery.  __x__ Follow recommendations from Cardiologist, Pulmonologist or PCP regarding stopping Aspirin, Coumadin, Plavix, Eliquis, Effient, Pradaxa, and Pletal.  __x__ STARTING TODAY: Stop Anti-inflammatories such as Diclofenac (Voltaren), Advil, Ibuprofen, Motrin, Aleve, Naproxen, Naprosyn, BC/Goodies powders or aspirin products. You may continue to take Tylenol and Celebrex.   __x__ STARTING TODAY: Do not take any over the counter supplements until after surgery.

## 2019-06-02 ENCOUNTER — Other Ambulatory Visit: Payer: Self-pay

## 2019-06-02 ENCOUNTER — Other Ambulatory Visit
Admission: RE | Admit: 2019-06-02 | Discharge: 2019-06-02 | Disposition: A | Discharge status: Home / Self Care | Payer: Medicare Other | Source: Ambulatory Visit | Attending: Orthopedic Surgery | Admitting: Orthopedic Surgery

## 2019-06-02 DIAGNOSIS — M545 Low back pain: Secondary | ICD-10-CM | POA: Diagnosis not present

## 2019-06-02 DIAGNOSIS — Z20828 Contact with and (suspected) exposure to other viral communicable diseases: Secondary | ICD-10-CM | POA: Insufficient documentation

## 2019-06-02 DIAGNOSIS — G8929 Other chronic pain: Secondary | ICD-10-CM | POA: Diagnosis not present

## 2019-06-02 DIAGNOSIS — Z01812 Encounter for preprocedural laboratory examination: Secondary | ICD-10-CM | POA: Diagnosis not present

## 2019-06-02 LAB — SARS CORONAVIRUS 2 (TAT 6-24 HRS): SARS Coronavirus 2: NEGATIVE

## 2019-06-05 DIAGNOSIS — Z01818 Encounter for other preprocedural examination: Secondary | ICD-10-CM | POA: Diagnosis not present

## 2019-06-05 DIAGNOSIS — F419 Anxiety disorder, unspecified: Secondary | ICD-10-CM | POA: Diagnosis not present

## 2019-06-05 DIAGNOSIS — G20 Parkinson's disease: Secondary | ICD-10-CM | POA: Diagnosis not present

## 2019-06-05 DIAGNOSIS — M25562 Pain in left knee: Secondary | ICD-10-CM | POA: Diagnosis not present

## 2019-06-07 ENCOUNTER — Inpatient Hospital Stay: Payer: Medicare Other | Admitting: Anesthesiology

## 2019-06-07 ENCOUNTER — Inpatient Hospital Stay
Admission: RE | Admit: 2019-06-07 | Discharge: 2019-06-10 | DRG: 470 | Disposition: A | Discharge status: Skilled Nursing Facility | Payer: Medicare Other | Attending: Orthopedic Surgery | Admitting: Orthopedic Surgery

## 2019-06-07 ENCOUNTER — Encounter: Payer: Self-pay | Admitting: *Deleted

## 2019-06-07 ENCOUNTER — Encounter
Admission: RE | Disposition: A | Discharge status: Skilled Nursing Facility | Payer: Self-pay | Source: Home / Self Care | Attending: Orthopedic Surgery

## 2019-06-07 ENCOUNTER — Inpatient Hospital Stay: Payer: Medicare Other

## 2019-06-07 ENCOUNTER — Other Ambulatory Visit: Payer: Self-pay

## 2019-06-07 DIAGNOSIS — Z7401 Bed confinement status: Secondary | ICD-10-CM | POA: Diagnosis not present

## 2019-06-07 DIAGNOSIS — Z888 Allergy status to other drugs, medicaments and biological substances status: Secondary | ICD-10-CM

## 2019-06-07 DIAGNOSIS — Z791 Long term (current) use of non-steroidal anti-inflammatories (NSAID): Secondary | ICD-10-CM

## 2019-06-07 DIAGNOSIS — G8918 Other acute postprocedural pain: Secondary | ICD-10-CM | POA: Diagnosis not present

## 2019-06-07 DIAGNOSIS — Z79899 Other long term (current) drug therapy: Secondary | ICD-10-CM

## 2019-06-07 DIAGNOSIS — M1712 Unilateral primary osteoarthritis, left knee: Secondary | ICD-10-CM | POA: Diagnosis not present

## 2019-06-07 DIAGNOSIS — G20 Parkinson's disease: Secondary | ICD-10-CM | POA: Diagnosis present

## 2019-06-07 DIAGNOSIS — R404 Transient alteration of awareness: Secondary | ICD-10-CM | POA: Diagnosis not present

## 2019-06-07 DIAGNOSIS — M25762 Osteophyte, left knee: Secondary | ICD-10-CM | POA: Diagnosis present

## 2019-06-07 DIAGNOSIS — Z885 Allergy status to narcotic agent status: Secondary | ICD-10-CM

## 2019-06-07 DIAGNOSIS — K219 Gastro-esophageal reflux disease without esophagitis: Secondary | ICD-10-CM | POA: Diagnosis present

## 2019-06-07 DIAGNOSIS — Z8572 Personal history of non-Hodgkin lymphomas: Secondary | ICD-10-CM | POA: Diagnosis not present

## 2019-06-07 DIAGNOSIS — R0902 Hypoxemia: Secondary | ICD-10-CM | POA: Diagnosis not present

## 2019-06-07 DIAGNOSIS — Z87891 Personal history of nicotine dependence: Secondary | ICD-10-CM

## 2019-06-07 DIAGNOSIS — Z96652 Presence of left artificial knee joint: Secondary | ICD-10-CM | POA: Diagnosis not present

## 2019-06-07 DIAGNOSIS — E785 Hyperlipidemia, unspecified: Secondary | ICD-10-CM | POA: Diagnosis not present

## 2019-06-07 DIAGNOSIS — F418 Other specified anxiety disorders: Secondary | ICD-10-CM | POA: Diagnosis not present

## 2019-06-07 DIAGNOSIS — F329 Major depressive disorder, single episode, unspecified: Secondary | ICD-10-CM | POA: Diagnosis present

## 2019-06-07 DIAGNOSIS — M2550 Pain in unspecified joint: Secondary | ICD-10-CM | POA: Diagnosis not present

## 2019-06-07 DIAGNOSIS — R41841 Cognitive communication deficit: Secondary | ICD-10-CM | POA: Diagnosis not present

## 2019-06-07 DIAGNOSIS — M6281 Muscle weakness (generalized): Secondary | ICD-10-CM | POA: Diagnosis not present

## 2019-06-07 DIAGNOSIS — M25562 Pain in left knee: Secondary | ICD-10-CM | POA: Diagnosis not present

## 2019-06-07 DIAGNOSIS — R2681 Unsteadiness on feet: Secondary | ICD-10-CM | POA: Diagnosis not present

## 2019-06-07 DIAGNOSIS — Z20828 Contact with and (suspected) exposure to other viral communicable diseases: Secondary | ICD-10-CM | POA: Diagnosis present

## 2019-06-07 DIAGNOSIS — Z09 Encounter for follow-up examination after completed treatment for conditions other than malignant neoplasm: Secondary | ICD-10-CM

## 2019-06-07 DIAGNOSIS — Z471 Aftercare following joint replacement surgery: Secondary | ICD-10-CM | POA: Diagnosis not present

## 2019-06-07 DIAGNOSIS — E7800 Pure hypercholesterolemia, unspecified: Secondary | ICD-10-CM | POA: Diagnosis not present

## 2019-06-07 HISTORY — PX: TOTAL KNEE ARTHROPLASTY: SHX125

## 2019-06-07 LAB — ABO/RH: ABO/RH(D): O POS

## 2019-06-07 SURGERY — ARTHROPLASTY, KNEE, TOTAL
Anesthesia: Spinal | Site: Knee | Laterality: Left

## 2019-06-07 MED ORDER — CLONAZEPAM 1 MG PO TABS
1.0000 mg | ORAL_TABLET | Freq: Four times a day (QID) | ORAL | Status: DC
  Administered 2019-06-07 – 2019-06-10 (×12): 1 mg via ORAL
  Filled 2019-06-07 (×13): qty 1

## 2019-06-07 MED ORDER — CHLORHEXIDINE GLUCONATE 4 % EX LIQD: 60.0000 mL | Freq: Once | CUTANEOUS | Status: DC

## 2019-06-07 MED ORDER — DIPHENHYDRAMINE HCL 12.5 MG/5ML PO ELIX: 12.5000 mg | ORAL_SOLUTION | ORAL | Status: DC | PRN

## 2019-06-07 MED ORDER — ACETAMINOPHEN 325 MG PO TABS
325.0000 mg | ORAL_TABLET | Freq: Four times a day (QID) | ORAL | Status: DC | PRN
  Administered 2019-06-09: 325 mg via ORAL
  Filled 2019-06-07: qty 1

## 2019-06-07 MED ORDER — SODIUM CHLORIDE 0.9 % IR SOLN
Status: DC | PRN
  Administered 2019-06-07: 2000 mL

## 2019-06-07 MED ORDER — PANTOPRAZOLE SODIUM 40 MG PO TBEC
40.0000 mg | DELAYED_RELEASE_TABLET | Freq: Every day | ORAL | Status: DC
  Administered 2019-06-08 – 2019-06-10 (×3): 40 mg via ORAL
  Filled 2019-06-07 (×3): qty 1

## 2019-06-07 MED ORDER — LACTATED RINGERS IV SOLN
INTRAVENOUS | Status: DC
  Administered 2019-06-07 (×2): via INTRAVENOUS

## 2019-06-07 MED ORDER — TRAMADOL HCL 50 MG PO TABS
50.0000 mg | ORAL_TABLET | Freq: Four times a day (QID) | ORAL | Status: DC
  Administered 2019-06-07 – 2019-06-10 (×11): 50 mg via ORAL
  Filled 2019-06-07 (×13): qty 1

## 2019-06-07 MED ORDER — TRANEXAMIC ACID-NACL 1000-0.7 MG/100ML-% IV SOLN
1000.0000 mg | INTRAVENOUS | Status: AC
  Administered 2019-06-07: 1000 mg via INTRAVENOUS
  Filled 2019-06-07: qty 100

## 2019-06-07 MED ORDER — CARBIDOPA-LEVODOPA 25-100 MG PO TABS
2.0000 | ORAL_TABLET | Freq: Every day | ORAL | Status: DC
  Administered 2019-06-07 – 2019-06-10 (×18): 2 via ORAL
  Filled 2019-06-07 (×22): qty 2

## 2019-06-07 MED ORDER — METOCLOPRAMIDE HCL 10 MG PO TABS: 5.0000 mg | ORAL_TABLET | Freq: Three times a day (TID) | ORAL | Status: DC | PRN

## 2019-06-07 MED ORDER — ONDANSETRON HCL 4 MG/2ML IJ SOLN: 4.0000 mg | Freq: Four times a day (QID) | INTRAMUSCULAR | Status: DC | PRN

## 2019-06-07 MED ORDER — METHOCARBAMOL 1000 MG/10ML IJ SOLN
500.0000 mg | Freq: Four times a day (QID) | INTRAVENOUS | Status: DC | PRN
  Filled 2019-06-07: qty 5

## 2019-06-07 MED ORDER — METOCLOPRAMIDE HCL 5 MG/ML IJ SOLN: 5.0000 mg | Freq: Three times a day (TID) | INTRAMUSCULAR | Status: DC | PRN

## 2019-06-07 MED ORDER — QUETIAPINE FUMARATE 25 MG PO TABS
25.0000 mg | ORAL_TABLET | Freq: Every day | ORAL | Status: DC
  Administered 2019-06-07 – 2019-06-09 (×3): 25 mg via ORAL
  Filled 2019-06-07 (×3): qty 1

## 2019-06-07 MED ORDER — DOCUSATE SODIUM 100 MG PO CAPS
100.0000 mg | ORAL_CAPSULE | Freq: Two times a day (BID) | ORAL | Status: DC
  Administered 2019-06-08 – 2019-06-10 (×5): 100 mg via ORAL
  Filled 2019-06-07 (×5): qty 1

## 2019-06-07 MED ORDER — POVIDONE-IODINE 10 % EX SWAB: 2.0000 "application " | Freq: Once | CUTANEOUS | Status: DC

## 2019-06-07 MED ORDER — INFLUENZA VAC A&B SA ADJ QUAD 0.5 ML IM PRSY
0.5000 mL | PREFILLED_SYRINGE | INTRAMUSCULAR | Status: DC
  Filled 2019-06-07: qty 0.5

## 2019-06-07 MED ORDER — PROPOFOL 500 MG/50ML IV EMUL
INTRAVENOUS | Status: DC | PRN
  Administered 2019-06-07: 50 ug/kg/min via INTRAVENOUS

## 2019-06-07 MED ORDER — MIDAZOLAM HCL 2 MG/2ML IJ SOLN
INTRAMUSCULAR | Status: AC
  Filled 2019-06-07: qty 2

## 2019-06-07 MED ORDER — PROPOFOL 10 MG/ML IV BOLUS
INTRAVENOUS | Status: AC
  Filled 2019-06-07: qty 20

## 2019-06-07 MED ORDER — MORPHINE SULFATE (PF) 2 MG/ML IV SOLN
0.5000 mg | INTRAVENOUS | Status: DC | PRN
  Administered 2019-06-09: 0.5 mg via INTRAVENOUS
  Filled 2019-06-07: qty 1

## 2019-06-07 MED ORDER — FENTANYL CITRATE (PF) 100 MCG/2ML IJ SOLN
INTRAMUSCULAR | Status: AC
  Filled 2019-06-07: qty 2

## 2019-06-07 MED ORDER — PREGABALIN 50 MG PO CAPS
50.0000 mg | ORAL_CAPSULE | Freq: Three times a day (TID) | ORAL | Status: DC
  Administered 2019-06-07 – 2019-06-10 (×10): 50 mg via ORAL
  Filled 2019-06-07 (×10): qty 1

## 2019-06-07 MED ORDER — NEOMYCIN-POLYMYXIN-DEXAMETH 3.5-10000-0.1 OP OINT
1.0000 "application " | TOPICAL_OINTMENT | Freq: Every day | OPHTHALMIC | Status: DC
  Administered 2019-06-08: 1 via OPHTHALMIC
  Filled 2019-06-07: qty 3.5

## 2019-06-07 MED ORDER — PROPOFOL 500 MG/50ML IV EMUL
INTRAVENOUS | Status: AC
  Filled 2019-06-07: qty 50

## 2019-06-07 MED ORDER — BACITRACIN 50000 UNITS IM SOLR
INTRAMUSCULAR | Status: AC
  Filled 2019-06-07: qty 2

## 2019-06-07 MED ORDER — KETOROLAC TROMETHAMINE 15 MG/ML IJ SOLN
15.0000 mg | Freq: Four times a day (QID) | INTRAMUSCULAR | Status: AC
  Administered 2019-06-07 – 2019-06-08 (×4): 15 mg via INTRAVENOUS
  Filled 2019-06-07 (×4): qty 1

## 2019-06-07 MED ORDER — PREDNISOLONE ACETATE 1 % OP SUSP
1.0000 [drp] | Freq: Every day | OPHTHALMIC | Status: DC
  Administered 2019-06-08 – 2019-06-10 (×3): 1 [drp] via OPHTHALMIC
  Filled 2019-06-07 (×2): qty 1

## 2019-06-07 MED ORDER — FUROSEMIDE 20 MG PO TABS: 20.0000 mg | ORAL_TABLET | Freq: Every day | ORAL | Status: DC | PRN

## 2019-06-07 MED ORDER — ROPIVACAINE HCL 5 MG/ML IJ SOLN
INTRAMUSCULAR | Status: DC | PRN
  Administered 2019-06-07: 30 mL via PERINEURAL

## 2019-06-07 MED ORDER — FENTANYL CITRATE (PF) 100 MCG/2ML IJ SOLN
50.0000 ug | Freq: Once | INTRAMUSCULAR | Status: AC
  Administered 2019-06-07: 07:00:00 50 ug via INTRAVENOUS

## 2019-06-07 MED ORDER — PHENOL 1.4 % MT LIQD
1.0000 | OROMUCOSAL | Status: DC | PRN
  Filled 2019-06-07: qty 177

## 2019-06-07 MED ORDER — ACETAMINOPHEN 500 MG PO TABS
500.0000 mg | ORAL_TABLET | Freq: Four times a day (QID) | ORAL | Status: AC
  Administered 2019-06-07 – 2019-06-08 (×4): 500 mg via ORAL
  Filled 2019-06-07 (×4): qty 1

## 2019-06-07 MED ORDER — BUPIVACAINE-EPINEPHRINE (PF) 0.5% -1:200000 IJ SOLN
INTRAMUSCULAR | Status: DC | PRN
  Administered 2019-06-07: 30 mL

## 2019-06-07 MED ORDER — ONDANSETRON HCL 4 MG PO TABS: 4.0000 mg | ORAL_TABLET | Freq: Four times a day (QID) | ORAL | Status: DC | PRN

## 2019-06-07 MED ORDER — SODIUM CHLORIDE FLUSH 0.9 % IV SOLN
INTRAVENOUS | Status: AC
  Filled 2019-06-07: qty 40

## 2019-06-07 MED ORDER — CEFAZOLIN SODIUM-DEXTROSE 2-4 GM/100ML-% IV SOLN
2.0000 g | Freq: Four times a day (QID) | INTRAVENOUS | Status: AC
  Administered 2019-06-07 (×2): 2 g via INTRAVENOUS
  Filled 2019-06-07 (×2): qty 100

## 2019-06-07 MED ORDER — CEFAZOLIN SODIUM-DEXTROSE 2-4 GM/100ML-% IV SOLN
2.0000 g | INTRAVENOUS | Status: AC
  Administered 2019-06-07: 2 g via INTRAVENOUS

## 2019-06-07 MED ORDER — LIDOCAINE HCL (PF) 1 % IJ SOLN
INTRAMUSCULAR | Status: DC | PRN
  Administered 2019-06-07: 3 mL

## 2019-06-07 MED ORDER — BISACODYL 10 MG RE SUPP
10.0000 mg | Freq: Every day | RECTAL | Status: DC | PRN
  Administered 2019-06-10: 10 mg via RECTAL
  Filled 2019-06-07: qty 1

## 2019-06-07 MED ORDER — BUPIVACAINE HCL (PF) 0.5 % IJ SOLN
INTRAMUSCULAR | Status: AC
  Filled 2019-06-07: qty 30

## 2019-06-07 MED ORDER — HYDROCODONE-ACETAMINOPHEN 7.5-325 MG PO TABS
1.0000 | ORAL_TABLET | ORAL | Status: DC | PRN
  Administered 2019-06-07 – 2019-06-10 (×7): 1 via ORAL
  Filled 2019-06-07 (×7): qty 1

## 2019-06-07 MED ORDER — LACTATED RINGERS IV SOLN
INTRAVENOUS | Status: DC
  Administered 2019-06-08: 04:00:00 via INTRAVENOUS

## 2019-06-07 MED ORDER — CEFAZOLIN SODIUM-DEXTROSE 2-4 GM/100ML-% IV SOLN
INTRAVENOUS | Status: AC
  Filled 2019-06-07: qty 100

## 2019-06-07 MED ORDER — SODIUM CHLORIDE 0.9 % IV SOLN
INTRAVENOUS | Status: DC | PRN
  Administered 2019-06-07: 60 mL

## 2019-06-07 MED ORDER — ONDANSETRON HCL 4 MG/2ML IJ SOLN
INTRAMUSCULAR | Status: AC
  Filled 2019-06-07: qty 2

## 2019-06-07 MED ORDER — LIDOCAINE HCL (PF) 1 % IJ SOLN
INTRAMUSCULAR | Status: AC
  Filled 2019-06-07: qty 5

## 2019-06-07 MED ORDER — ROPIVACAINE HCL 5 MG/ML IJ SOLN
INTRAMUSCULAR | Status: AC
  Filled 2019-06-07: qty 30

## 2019-06-07 MED ORDER — MIDAZOLAM HCL 2 MG/2ML IJ SOLN
INTRAMUSCULAR | Status: AC
  Administered 2019-06-07: 1 mg via INTRAVENOUS
  Filled 2019-06-07: qty 2

## 2019-06-07 MED ORDER — BUPIVACAINE LIPOSOME 1.3 % IJ SUSP
INTRAMUSCULAR | Status: AC
  Filled 2019-06-07: qty 20

## 2019-06-07 MED ORDER — SODIUM CHLORIDE FLUSH 0.9 % IV SOLN
INTRAVENOUS | Status: AC
  Filled 2019-06-07: qty 20

## 2019-06-07 MED ORDER — HYDROCODONE-ACETAMINOPHEN 5-325 MG PO TABS
1.0000 | ORAL_TABLET | ORAL | Status: DC | PRN
  Filled 2019-06-07: qty 1

## 2019-06-07 MED ORDER — SODIUM FLUORIDE 1.1 % DT PSTE: 1.0000 "application " | PASTE | Freq: Every day | DENTAL | Status: DC

## 2019-06-07 MED ORDER — MIDAZOLAM HCL 5 MG/5ML IJ SOLN
INTRAMUSCULAR | Status: DC | PRN
  Administered 2019-06-07: 0.5 mg via INTRAVENOUS
  Administered 2019-06-07 (×2): 1 mg via INTRAVENOUS
  Administered 2019-06-07: 0.5 mg via INTRAVENOUS
  Administered 2019-06-07: 1 mg via INTRAVENOUS

## 2019-06-07 MED ORDER — TRAZODONE HCL 50 MG PO TABS
75.0000 mg | ORAL_TABLET | Freq: Every day | ORAL | Status: DC
  Administered 2019-06-08 – 2019-06-09 (×2): 75 mg via ORAL
  Filled 2019-06-07 (×2): qty 2

## 2019-06-07 MED ORDER — MAGNESIUM CITRATE PO SOLN
1.0000 | Freq: Once | ORAL | Status: DC | PRN
  Filled 2019-06-07: qty 296

## 2019-06-07 MED ORDER — FENTANYL CITRATE (PF) 100 MCG/2ML IJ SOLN
INTRAMUSCULAR | Status: AC
  Administered 2019-06-07: 50 ug via INTRAVENOUS
  Filled 2019-06-07: qty 2

## 2019-06-07 MED ORDER — ALUM & MAG HYDROXIDE-SIMETH 200-200-20 MG/5ML PO SUSP: 30.0000 mL | ORAL | Status: DC | PRN

## 2019-06-07 MED ORDER — MENTHOL 3 MG MT LOZG
1.0000 | LOZENGE | OROMUCOSAL | Status: DC | PRN
  Filled 2019-06-07: qty 9

## 2019-06-07 MED ORDER — METHOCARBAMOL 500 MG PO TABS
500.0000 mg | ORAL_TABLET | Freq: Four times a day (QID) | ORAL | Status: DC | PRN
  Administered 2019-06-08: 500 mg via ORAL
  Filled 2019-06-07: qty 1

## 2019-06-07 MED ORDER — MIDAZOLAM HCL 2 MG/2ML IJ SOLN
1.0000 mg | Freq: Once | INTRAMUSCULAR | Status: AC
  Administered 2019-06-07: 07:00:00 1 mg via INTRAVENOUS

## 2019-06-07 MED ORDER — EPINEPHRINE PF 1 MG/ML IJ SOLN
INTRAMUSCULAR | Status: AC
  Filled 2019-06-07: qty 1

## 2019-06-07 MED ORDER — ASPIRIN 81 MG PO CHEW
81.0000 mg | CHEWABLE_TABLET | Freq: Two times a day (BID) | ORAL | Status: DC
  Administered 2019-06-07 – 2019-06-10 (×6): 81 mg via ORAL
  Filled 2019-06-07 (×6): qty 1

## 2019-06-07 MED ORDER — QUETIAPINE FUMARATE 25 MG PO TABS
50.0000 mg | ORAL_TABLET | Freq: Every day | ORAL | Status: DC
  Administered 2019-06-07 – 2019-06-09 (×3): 50 mg via ORAL
  Filled 2019-06-07 (×3): qty 2

## 2019-06-07 MED ORDER — BUPIVACAINE HCL (PF) 0.5 % IJ SOLN
INTRAMUSCULAR | Status: DC | PRN
  Administered 2019-06-07: 2.5 mL

## 2019-06-07 SURGICAL SUPPLY — 59 items
BASEPLATE TIBIAL SZ 4 LT (Knees) ×2 IMPLANT
BLADE SAW 18WX90L 1.27 THK (BLADE) ×2 IMPLANT
BLADE SAW SAG 25X90X1.19 (BLADE) ×2 IMPLANT
BOWL CEMENT MIX W/ADAPTER (MISCELLANEOUS) ×2 IMPLANT
BRUSH SCRUB EZ  4% CHG (MISCELLANEOUS) ×2
BRUSH SCRUB EZ 4% CHG (MISCELLANEOUS) ×2 IMPLANT
CANISTER SUCT 1200ML W/VALVE (MISCELLANEOUS) ×2 IMPLANT
CANISTER SUCT 3000ML PPV (MISCELLANEOUS) ×4 IMPLANT
CEMENT BONE 1-PACK (Cement) ×4 IMPLANT
CHLORAPREP W/TINT 26 (MISCELLANEOUS) ×4 IMPLANT
COMP PATELLA GENESIS 29 OVAL (Orthopedic Implant) ×2 IMPLANT
COMPONENT FEM OXINIUM LT SZ4 (Knees) ×2 IMPLANT
COMPONENT PTLLA GENS 29 OVAL (Orthopedic Implant) ×1 IMPLANT
COOLER POLAR GLACIER W/PUMP (MISCELLANEOUS) ×2 IMPLANT
COVER WAND RF STERILE (DRAPES) ×2 IMPLANT
CUFF TOURN SGL QUICK 30 (TOURNIQUET CUFF) ×1
CUFF TRNQT CYL 30X4X21-28X (TOURNIQUET CUFF) ×1 IMPLANT
DRAPE 3/4 80X56 (DRAPES) ×4 IMPLANT
DRAPE INCISE IOBAN 66X60 STRL (DRAPES) ×2 IMPLANT
ELECT REM PT RETURN 9FT ADLT (ELECTROSURGICAL) ×2
ELECTRODE REM PT RTRN 9FT ADLT (ELECTROSURGICAL) ×1 IMPLANT
GAUZE SPONGE 4X4 12PLY STRL (GAUZE/BANDAGES/DRESSINGS) ×2 IMPLANT
GAUZE XEROFORM 1X8 LF (GAUZE/BANDAGES/DRESSINGS) ×2 IMPLANT
GLOVE BIO SURGEON STRL SZ8 (GLOVE) ×2 IMPLANT
GLOVE BIOGEL PI IND STRL 8.5 (GLOVE) ×1 IMPLANT
GLOVE BIOGEL PI INDICATOR 8.5 (GLOVE) ×1
GLOVE INDICATOR 8.0 STRL GRN (GLOVE) ×2 IMPLANT
GLOVE SURG ORTHO 8.0 STRL STRW (GLOVE) ×6 IMPLANT
GOWN STRL REUS W/ TWL LRG LVL3 (GOWN DISPOSABLE) ×1 IMPLANT
GOWN STRL REUS W/ TWL XL LVL3 (GOWN DISPOSABLE) ×1 IMPLANT
GOWN STRL REUS W/TWL LRG LVL3 (GOWN DISPOSABLE) ×1
GOWN STRL REUS W/TWL XL LVL3 (GOWN DISPOSABLE) ×1
HOOD PEEL AWAY FLYTE STAYCOOL (MISCELLANEOUS) ×6 IMPLANT
INSERT ARTI HI FLEX 3-4 SZ10 (Insert) ×2 IMPLANT
IV NS 1000ML (IV SOLUTION) ×1
IV NS 1000ML BAXH (IV SOLUTION) ×1 IMPLANT
KIT TURNOVER KIT A (KITS) ×2 IMPLANT
MAT ABSORB  FLUID 56X50 GRAY (MISCELLANEOUS) ×1
MAT ABSORB FLUID 56X50 GRAY (MISCELLANEOUS) ×1 IMPLANT
NDL SAFETY ECLIPSE 18X1.5 (NEEDLE) ×1 IMPLANT
NEEDLE HYPO 18GX1.5 SHARP (NEEDLE) ×1
NEEDLE SPNL 20GX3.5 QUINCKE YW (NEEDLE) ×2 IMPLANT
NS IRRIG 1000ML POUR BTL (IV SOLUTION) ×2 IMPLANT
PACK TOTAL KNEE (MISCELLANEOUS) ×2 IMPLANT
PAD DE MAYO PRESSURE PROTECT (MISCELLANEOUS) ×2 IMPLANT
PAD WRAPON POLAR KNEE (MISCELLANEOUS) ×1 IMPLANT
PULSAVAC PLUS IRRIG FAN TIP (DISPOSABLE) ×2
STAPLER SKIN PROX 35W (STAPLE) ×2 IMPLANT
SUCTION FRAZIER HANDLE 10FR (MISCELLANEOUS) ×1
SUCTION TUBE FRAZIER 10FR DISP (MISCELLANEOUS) ×1 IMPLANT
SUT DVC 2 QUILL PDO  T11 36X36 (SUTURE) ×1
SUT DVC 2 QUILL PDO T11 36X36 (SUTURE) ×1 IMPLANT
SUT VIC AB 2-0 CT1 18 (SUTURE) ×2 IMPLANT
SUT VIC AB 2-0 CT1 27 (SUTURE)
SUT VIC AB 2-0 CT1 TAPERPNT 27 (SUTURE) IMPLANT
SUT VIC AB PLUS 45CM 1-MO-4 (SUTURE) ×2 IMPLANT
SYR 30ML LL (SYRINGE) ×6 IMPLANT
TIP FAN IRRIG PULSAVAC PLUS (DISPOSABLE) ×1 IMPLANT
WRAPON POLAR PAD KNEE (MISCELLANEOUS) ×2

## 2019-06-07 NOTE — Anesthesia Procedure Notes (Addendum)
Spinal  Patient location during procedure: OR Start time: 06/07/2019 8:30 AM End time: 06/07/2019 8:41 AM Staffing Anesthesiologist: Emmie Niemann, MD Resident/CRNA: Justus Memory, CRNA Performed: resident/CRNA and anesthesiologist  Preanesthetic Checklist Completed: patient identified, site marked, surgical consent, pre-op evaluation, timeout performed, IV checked, risks and benefits discussed and monitors and equipment checked Spinal Block Patient position: sitting Prep: ChloraPrep Patient monitoring: heart rate, cardiac monitor, continuous pulse ox and blood pressure Approach: midline Location: L3-4 Injection technique: single-shot Needle Needle type: Pencil-Tip and Introducer  Needle gauge: 24 G Needle length: 9 cm Assessment Sensory level: T4 Additional Notes Negative paresthesia. Negative blood return. Positive free-flowing CSF. Expiration date of kit checked and confirmed. Patient tolerated procedure well, without complications.

## 2019-06-07 NOTE — Anesthesia Post-op Follow-up Note (Signed)
Anesthesia QCDR form completed.        

## 2019-06-07 NOTE — Op Note (Signed)
DATE OF SURGERY:  06/07/2019 TIME: 10:17 AM  PATIENT NAME:  Sarah Donaldson   AGE: 68 y.o.    PRE-OPERATIVE DIAGNOSIS:  M17.12 unilateral primary osteoarthritis left knee  POST-OPERATIVE DIAGNOSIS:  Same  PROCEDURE:  Procedure(s): TOTAL KNEE ARTHROPLASTY, left  SURGEON:  Lovell Sheehan, MD   ASSISTANT:  Carlynn Spry, PA-C  OPERATIVE IMPLANTS: Tamala [employer]hew, Cruciate Retaining Oxinium Femoral component size  4, Fixed Bearing Tray size 4, Patella polyethylene 3-peg oval button size 29 mm, with a 10 mm HighFlex insert.   PREOPERATIVE INDICATIONS:  [name] is an 68 y.o. female who has a diagnosis of M17.12 unilateral primary osteoarthritis left knee and elected for a left total knee arthroplasty after failing nonoperative treatment, including activity modification, pain medication, physical therapy and injections who has significant impairment of their activities of daily living.  Radiographs have demonstrated tricompartmental osteoarthritis joint space narrowing, osteophytes, subchondral sclerosis and cyst formation.  The risks, benefits, and alternatives were discussed at length including but not limited to the risks of infection, bleeding, nerve or blood vessel injury, knee stiffness, fracture, dislocation, loosening or failure of the hardware and the need for further surgery. Medical risks include but not limited to DVT and pulmonary embolism, myocardial infarction, stroke, pneumonia, respiratory failure and death. I discussed these risks with the patient in my office prior to the date of surgery. They understood these risks and were willing to proceed.  OPERATIVE FINDINGS AND UNIQUE ASPECTS OF THE CASE:  All three compartments with advanced and severe degenerative changes, large osteophytes and an abundance of synovial fluid. Significant deformity was also noted. A decision was made to proceed with total knee arthroplasty.   OPERATIVE DESCRIPTION:  The patient was brought  to the operative room and placed in a supine position after undergoing placement of a general anesthetic. IV antibiotics were given. Patient received tranexamic acid. The lower extremity was prepped and draped in the usual sterile fashion.  A time out was performed to verify the patient's name, date of birth, medical record number, correct site of surgery and correct procedure to be performed. The timeout was also used to confirm the patient received antibiotics and that appropriate instruments, implants and radiographs studies were available in the room.  The leg was elevated and exsanguinated with an Esmarch and the tourniquet was inflated to 275 mmHg.  A midline incision was made over the left knee.. A medial parapatellar arthrotomy was then made and the patella subluxed laterally and the knee was brought into 90 of flexion. Hoffa's fat pad along with the anterior cruciate ligament was resected and the medial joint line was exposed.  Attention was then turned to preparation of the patella. The thickness of the patella was measured with a caliper, the diameter measured with the patella templates.  The patella resection was then made with an oscillating saw using the patella cutting guide.  The 29 mm button fit appropriately.  3 peg holes for the patella component were then drilled.  The extramedullary tibial cutting guide was then placed using the anterior tibial crest and second ray of the foot as a reference.  The tibial cutting guide was adjusted to allow for appropriate posterior slope.  The tibial cutting block was pinned into position. The slotted stylus was used to measure the proximal tibial resection of 9 mm off the high lateral side. Care was taken during the tibial resection to protect the medial and collateral ligaments.  The resected tibial bone was removed.  The distal femur was resected using the Visionaire cutting guide.  Care was taken to protect the collateral ligaments during distal  femoral resection.  The distal femoral resection was performed with an oscillating saw. The femoral cutting guide was then removed. Extension gap was measured with a 10 mm spacer block and alignment and extension was confirmed using a long alignment rod. The femur was sized to be a 4. Rotation of the referencing guide was checked with the epicondylar axis and Whitesides line. Then the 4-in-1 cutting jig was then applied to the distal femur. A stylus was used to confirm that the anterior femur would not be notched.   Then the anterior, posterior and chamfer femoral cuts were then made with an oscillating saw.  The knee was distracted and all posterior osteophytes were removed.  The flexion gap was then measured with a flexion spacer block and long alignment rod and was found to be symmetric with the extension gap and perpendicular to mechanical axis of the tibia.  The proximal tibia plateau was then sized with trial trays. The best coverage was achieved with a size 4. This tibial tray was then pinned into position. The proximal tibia was then prepared with the keel punch.  After tibial preparation was completed, all trial components were inserted with polyethylene trials. The knee achieved full extension and flexed to 120 degrees. Ligament were stable to varus and valgus at full extension as well as 30, 60 and 90 degrees of flexion.   The trials were then placed. Knee was taken through a full range of motion and deemed to be stable with the trial components. All trial components were then removed.  The joint was copiously irrigated with pulse lavage.  The final total knee arthroplasty components were then cemented into place. The knee was held in extension while cement was allowed to cure.The knee was taken through a range of motion and the patella tracked well and the knee was again irrigated copiously.  The knee capsule was then injected with Exparel.  The medial arthrotomy was closed with #1 Vicryl and #2  Quill. The subcutaneous tissue closed with  2-0 vicryl, and skin approximated with staples.  A dry sterile and compressive dressing was applied.  A Polar Care was applied to the operative knee.  The patient was awakened and brought to the PACU in stable and satisfactory condition.  All sharp, lap and instrument counts were correct at the conclusion the case. I spoke with the patient's family in the postop consultation room to let them know the case had been performed without complication and the patient was stable in recovery room.   Total tourniquet time was 42 minutes.

## 2019-06-07 NOTE — Evaluation (Signed)
Physical Therapy Evaluation Patient Details Name: Sarah Donaldson MRN: [id number] DOB: Mar 15, 1951 Today's Date: 06/07/2019   History of Present Illness  Pt is a 68 y.o. female s/p L TKA 06/07/19 secondary unilateral primary OA L knee.  PMH includes Parkinson's disease, h/o lumbar fusion, schizophrenia, anxiety, depression, non-hodgkin's lymphoma (in remission), chronic back pain with symptom's radiating down to B LE's, and hiatal hernia.  Clinical Impression  Prior to hospital admission, pt was most recently requiring significant assist with transfers to w/c or scooter but pt reports she was able to walk about 2 months ago with a walker but has had a progressive decline in status and her husband has had difficulty assisting her.  Pt lives with her husband in 1 level home with 3 steps to enter with R railing.  Currently pt is 1-2 assist with bed mobility and close SBA to min assist for sitting balance (d/t posterior lean).  Pt reporting significant L knee pain but pt agreeable to LE ex's in bed and sitting on edge of bed.  Pt appearing "stiff" in general and overal limited d/t L knee pain requiring extra time to reposition and attempt to make pt as comfortable as possible.  Pt would benefit from skilled PT to address noted impairments and functional limitations (see below for any additional details).  Upon hospital discharge, pt would benefit from STR.    Follow Up Recommendations SNF    Equipment Recommendations  Rolling walker with 5" wheels;3in1 (PT);Wheelchair (measurements PT);Wheelchair cushion (measurements PT)    Recommendations for Other Services OT consult     Precautions / Restrictions Precautions Precautions: Fall;Knee Precaution Booklet Issued: No Restrictions Weight Bearing Restrictions: Yes LLE Weight Bearing: Weight bearing as tolerated      Mobility  Bed Mobility Overal bed mobility: Needs Assistance Bed Mobility: Supine to Sit;Sit to Supine    Logrolling L in bed:  x2 trials (on/off bed pan to urinate per pt request) with use of bed rail min to mod assist x1 Supine to sit: Max assist;HOB elevated Sit to supine: +2 for physical assistance;HOB elevated   General bed mobility comments: vc's/assist for LE movement and assist for trunk with vc's for UE positioning to assist semi-supine to sit; assist for trunk and B LE's sit to semi-supine;  2 assist to scoot up in bed end of session  Transfers                 General transfer comment: Deferred d/t pt reporting too much L knee pain to attempt today  Ambulation/Gait             General Gait Details: Deferred d/t pt reporting too much L knee pain to attempt today  Stairs            Wheelchair Mobility    Modified Rankin (Stroke Patients Only)       Balance Overall balance assessment: Needs assistance Sitting-balance support: Bilateral upper extremity supported;Feet supported Sitting balance-Leahy Scale: Poor Sitting balance - Comments: fluctuating between min assist to close SBA sitting balance; vc's to shift weight forward (pt appearing "stiff" with backwards lean) Postural control: Posterior lean                                   Pertinent Vitals/Pain Pain Assessment: 0-10 Pain Score: 10-Worst pain ever Pain Location: L knee Pain Descriptors / Indicators: Aching;Tender;Sore;Discomfort;Constant Pain Intervention(s): Limited activity within patient's tolerance;Monitored during session;Premedicated before  session;Repositioned;Other (comment)(polar care applied and activated)    Home Living Family/patient expects to be discharged to:: Private residence Living Arrangements: Spouse/significant other Available Help at Discharge: Family Type of Home: House Home Access: Stairs to enter Entrance Stairs-Rails: Right Entrance Stairs-Number of Steps: [place]: One [place]: Grab bars - tub/shower;Grab bars - toilet;Walker - 2 wheels;Electric  scooter;Wheelchair - manual Additional Comments: sleigh bed (with side rail but is angled d/t bed set-up)    Prior Function Level of Independence: Needs assistance   Gait / Transfers Assistance Needed: Pt reports being able to walk with RW (on own) about 2 months ago but has had increasing difficulty since and has been needing to use manual w/c or scooter for last 2 weeks with significant assist of husband for transfers           Hand Dominance        Extremity/Trunk Assessment   Upper Extremity Assessment Upper Extremity Assessment: Generalized weakness    Lower Extremity Assessment Lower Extremity Assessment: Generalized weakness(increased stiffness noted in general B LE's; B LE tremors noted)    Cervical / Trunk Assessment Cervical / Trunk Assessment: Normal  Communication   Communication: (thicker speech (improved some during session)--nurse aware already)  Cognition Arousal/Alertness: Awake/alert Behavior During Therapy: Anxious                                   General Comments: Oriented to person, place, time, and situation.  Pt joking intermittently during session.  Pt with difficulty focusing occasionally during session (may be limited d/t pain).      General Comments General comments (skin integrity, edema, etc.): L knee ace wrap and polar care in place.  Nursing cleared pt for participation in physical therapy.  Pt agreeable to PT session.    Exercises Total Joint Exercises Ankle Circles/Pumps: AROM;Strengthening;Both;10 reps;Supine Quad Sets: AROM;Strengthening;Left;10 reps;Supine(decreased initiation and in-consistent quality of contraction noted) Heel Slides: AAROM;Strengthening;Left;10 reps;Supine(Limited ROM d/t L knee pain) Hip ABduction/ADduction: AAROM;Strengthening;Left;10 reps;Supine(Limited ROM d/t L knee pain) Goniometric ROM: L knee extension AROM 15 degrees short of neutral semi-supine in bed; L knee flexion AROM 60 degrees sitting  edge of bed   Assessment/Plan    PT Assessment Patient needs continued PT services  PT Problem List Decreased strength;Decreased range of motion;Decreased activity tolerance;Decreased balance;Decreased mobility;Decreased knowledge of use of DME;Decreased knowledge of precautions;Pain;Decreased skin integrity       PT Treatment Interventions DME instruction;Gait training;Stair training;Functional mobility training;Therapeutic activities;Therapeutic exercise;Balance training;Neuromuscular re-education;Patient/family education;Wheelchair mobility training    PT Goals (Current goals can be found in the Care Plan section)  Acute Rehab PT Goals Patient Stated Goal: to have less pain and improve mobility PT Goal Formulation: With patient Time For Goal Achievement: 06/21/19 Potential to Achieve Goals: Good    Frequency BID   Barriers to discharge Decreased caregiver support      Co-evaluation               AM-PAC PT "6 Clicks" Mobility  Outcome Measure Help needed turning from your back to your side while in a flat bed without using bedrails?: A Lot Help needed moving from lying on your back to sitting on the side of a flat bed without using bedrails?: A Lot Help needed moving to and from a bed to a chair (including a wheelchair)?: Total Help needed standing up from a chair using your arms (e.g., wheelchair or bedside chair)?:  Total Help needed to walk in hospital room?: Total Help needed climbing 3-5 steps with a railing? : Total 6 Click Score: 8    End of Session Equipment Utilized During Treatment: Oxygen Activity Tolerance: Patient limited by pain Patient left: in bed;with call bell/phone within reach;with bed alarm set;with nursing/sitter in room;with SCD's reapplied;Other (comment)(B heels floating via towel rolls; polar care in place and activated) Nurse Communication: Mobility status;Precautions;Weight bearing status;Other (comment)(Pt's report of 10/10 L knee pain) PT  Visit Diagnosis: Other abnormalities of gait and mobility (R26.89);Muscle weakness (generalized) (M62.81);Difficulty in walking, not elsewhere classified (R26.2);Pain Pain - Right/Left: Left Pain - part of body: Knee    Time: 1430-1520 PT Time Calculation (min) (ACUTE ONLY): 50 min   Charges:   PT Evaluation $PT Eval Low Complexity: 1 Low PT Treatments $Therapeutic Exercise: 8-22 mins $Therapeutic Activity: 8-22 mins       Leitha Bleak, PT 06/07/19, 4:07 PM [phone]

## 2019-06-07 NOTE — Anesthesia Preprocedure Evaluation (Signed)
Anesthesia Evaluation  Patient identified by MRN, date of birth, ID band Patient awake    Reviewed: Allergy & Precautions, NPO status , Patient's Chart, lab work & pertinent test results  History of Anesthesia Complications Negative for: history of anesthetic complications  Airway Mallampati: II  TM Distance: >3 FB Neck ROM: Full    Dental no notable dental hx.    Pulmonary neg sleep apnea, neg COPD, former smoker,    breath sounds clear to auscultation- rhonchi (-) wheezing      Cardiovascular (-) hypertension(-) CAD, (-) Past MI, (-) Cardiac Stents and (-) CABG  Rhythm:Regular Rate:Normal - Systolic murmurs and - Diastolic murmurs    Neuro/Psych neg Seizures PSYCHIATRIC DISORDERS Anxiety Depression Schizophrenia Parkinson's disease    GI/Hepatic Neg liver ROS, GERD  ,  Endo/Other  negative endocrine ROSneg diabetes  Renal/GU negative Renal ROS     Musculoskeletal  (+) Arthritis ,   Abdominal (+) - obese,   Peds  Hematology negative hematology ROS (+)   Anesthesia Other Findings Past Medical History: No date: Cancer ([place])     Comment:  Non-hodgkin's lymphoma (in remission) No date: GERD (gastroesophageal reflux disease)     Comment:  well-controlled w/ omeprazole No date: Hyperlipidemia 02/07/2018: Knee joint cyst, left No date: Parkinson disease ([place]) No date: Parkinson disease ([place])   Reproductive/Obstetrics                             Lab Results  Component Value Date   WBC 7.5 05/31/2019   HGB 13.2 05/31/2019   HCT 42.1 05/31/2019   MCV 90.9 05/31/2019   PLT 229 05/31/2019    Anesthesia Physical Anesthesia Plan  ASA: III  Anesthesia Plan: Spinal   Post-op Pain Management:  Regional for Post-op pain   Induction:   PONV Risk Score and Plan: 2 and Propofol infusion  Airway Management Planned: Natural Airway  Additional Equipment:   Intra-op Plan:    Post-operative Plan:   Informed Consent: I have reviewed the patients History and Physical, chart, labs and discussed the procedure including the risks, benefits and alternatives for the proposed anesthesia with the patient or authorized representative who has indicated his/her understanding and acceptance.     Dental advisory given  Plan Discussed with: CRNA and Anesthesiologist  Anesthesia Plan Comments:         Anesthesia Quick Evaluation

## 2019-06-07 NOTE — H&P (Signed)
The patient has been re-examined, and the chart reviewed, and there have been no interval changes to the documented history and physical.  Plan a left total knee today.  Anesthesia is consulted regarding a peripheral nerve block for post-operative pain.  The risks, benefits, and alternatives have been discussed at length, and the patient is willing to proceed.     

## 2019-06-07 NOTE — Anesthesia Procedure Notes (Signed)
Anesthesia Regional Block: Adductor canal block   Pre-Anesthetic Checklist: ,, timeout performed, Correct Patient, Correct Site, Correct Laterality, Correct Procedure, Correct Position, site marked, Risks and benefits discussed,  Surgical consent,  Pre-op evaluation,  At surgeon's request and post-op pain management  Laterality: Left  Prep: chloraprep       Needles:  Injection technique: Single-shot  Needle Type: Echogenic Needle     Needle Length: 10cm  Needle Gauge: 21     Additional Needles:   Procedures:,,,, ultrasound used (permanent image in chart),,,,  Narrative:  Start time: 06/07/2019 7:09 AM End time: 06/07/2019 7:15 AM Injection made incrementally with aspirations every 5 mL.  Performed by: Personally  Anesthesiologist: Emmie Niemann, MD

## 2019-06-07 NOTE — Transfer of Care (Signed)
Immediate Anesthesia Transfer of Care Note  Patient: Sarah Donaldson  Procedure(s) Performed: TOTAL KNEE ARTHROPLASTY (Left Knee)  Patient Location: PACU Anesthesia Type:Spinal  Level of Consciousness: sedated  Airway & Oxygen Therapy: Patient Spontanous Breathing and Patient connected to face mask oxygen  Post-op Assessment: Report given to RN  Post vital signs: Reviewed and stable  Last Vitals:  Vitals Value Taken Time  BP 127/71 06/07/19 1026  Temp    Pulse 81 06/07/19 1030  Resp 18 06/07/19 1030  SpO2 99 % 06/07/19 1030  Vitals shown include unvalidated device data.  Last Pain:  Vitals:   06/07/19 0638  TempSrc: Tympanic  PainSc: 3       Patients Stated Pain Goal: 0 ([id number])  Complications: No apparent anesthesia complications

## 2019-06-08 LAB — BASIC METABOLIC PANEL
Anion gap: 8 (ref 5–15)
BUN: 23 mg/dL (ref 8–23)
CO2: 28 mmol/L (ref 22–32)
Calcium: 9.2 mg/dL (ref 8.9–10.3)
Chloride: 106 mmol/L (ref 98–111)
Creatinine, Ser: 0.46 mg/dL (ref 0.44–1.00)
GFR calc Af Amer: >60 mL/min (ref >60)
GFR calc non Af Amer: >60 mL/min (ref >60)
Glucose, Bld: 102 mg/dL — ABNORMAL HIGH (ref 70–99)
Potassium: 3.7 mmol/L (ref 3.5–5.1)
Sodium: 142 mmol/L (ref 135–145)

## 2019-06-08 LAB — CBC
HCT: 37.6 % (ref 36.0–46.0)
Hemoglobin: 11.8 g/dL — ABNORMAL LOW (ref 12.0–15.0)
MCH: 28.4 pg (ref 26.0–34.0)
MCHC: 31.4 g/dL (ref 30.0–36.0)
MCV: 90.6 fL (ref 80.0–100.0)
Platelets: 185 10*3/uL (ref 150–400)
RBC: 4.15 MIL/uL (ref 3.87–5.11)
RDW: 14.3 % (ref 11.5–15.5)
WBC: 9.4 10*3/uL (ref 4.0–10.5)
nRBC: 0 % (ref 0.0–0.2)

## 2019-06-08 NOTE — Progress Notes (Signed)
  Subjective:  Patient reports pain as moderate.    Objective:   VITALS:   Vitals:   06/07/19 2308 06/08/19 0358 06/08/19 0400 06/08/19 0902  BP: 125/74 (!) 163/95 (!) 152/91 135/82  Pulse: 86 84 79 92  Resp: 19 18  18   Temp:  98 F (36.7 C)  98.7 F (37.1 C)  TempSrc:  Oral  Oral  SpO2: 98% 100%  98%  Weight:      Height:        PHYSICAL EXAM:  Sensation intact distally Dorsiflexion/Plantar flexion intact Incision: dressing C/D/I Compartment soft  LABS  Results for orders placed or performed during the hospital encounter of 06/07/19 (from the past 24 hour(s))  CBC     Status: Abnormal   Collection Time: 06/08/19  4:52 AM  Result Value Ref Range   WBC 9.4 4.0 - 10.5 K/uL   RBC 4.15 3.87 - 5.11 MIL/uL   Hemoglobin 11.8 (L) 12.0 - 15.0 g/dL   HCT 37.6 36.0 - 46.0 %   MCV 90.6 80.0 - 100.0 fL   MCH 28.4 26.0 - 34.0 pg   MCHC 31.4 30.0 - 36.0 g/dL   RDW 14.3 11.5 - 15.5 %   Platelets 185 150 - 400 K/uL   nRBC 0.0 0.0 - 0.2 %  Basic metabolic panel     Status: Abnormal   Collection Time: 06/08/19  4:52 AM  Result Value Ref Range   Sodium 142 135 - 145 mmol/L   Potassium 3.7 3.5 - 5.1 mmol/L   Chloride 106 98 - 111 mmol/L   CO2 28 22 - 32 mmol/L   Glucose, Bld 102 (H) 70 - 99 mg/dL   BUN 23 8 - 23 mg/dL   Creatinine, Ser 0.46 0.44 - 1.00 mg/dL   Calcium 9.2 8.9 - 10.3 mg/dL   GFR calc non Af Amer >60 >60 mL/min   GFR calc Af Amer >60 >60 mL/min   Anion gap 8 5 - 15    Dg Knee Left Port  Result Date: 06/07/2019 CLINICAL DATA:  Status post left knee arthroplasty. EXAM: PORTABLE LEFT KNEE - 1-2 VIEW COMPARISON:  None. FINDINGS: Two views demonstrate normal alignment status post total left knee arthroplasty. There is some air and fluid in the joint space. IMPRESSION: Normal alignment of total left knee arthroplasty. Electronically Signed   By: Aletta Edouard M.D.   On: 06/07/2019 11:23   [place] Or Nerve Block-image Only ([facility])  Result Date: 06/07/2019 There is  no interpretation for this exam.  This order is for images obtained during a surgical procedure.  Please See "Surgeries" Tab for more information regarding the procedure.    Assessment/Plan: 1 Day Post-Op   Active Problems:   S/P total knee replacement, left   Up with therapy Discharge to SNF likely on Saturday Dressing change tomorrow   Lovell Sheehan , MD 06/08/2019, 3:53 PM

## 2019-06-08 NOTE — NC FL2 (Signed)
[place] LEVEL OF CARE SCREENING TOOL     IDENTIFICATION  Patient Name: Sarah Donaldson Birthdate: 06/30/1951 Sex: female Admission Date (Current Location): 06/07/2019  [place] and [place] Number:  Engineering geologist and Address:  [facility], [address] [place], [place] [address]      Provider Number: [id number]  Attending Physician Name and Address:  Lovell Sheehan, MD  Relative Name and Phone Number:       Current Level of Care: Hospital Recommended Level of Care: [place] Prior Approval Number:    Date Approved/Denied:   PASRR Number: [id number]  Discharge Plan: SNF    Current Diagnoses: Patient Active Problem List   Diagnosis Date Noted  . S/P total knee replacement, left 06/07/2019  . Panic attacks 04/07/2019  . Psychosis ([place]) 04/04/2019  . Numbness 03/09/2018  . Lumbar spondylosis 03/07/2018  . Chronic pain of left knee 03/07/2018  . Chronic upper back pain 12/08/2017  . Benign neoplasm of hand 10/20/2017  . Chronic pain syndrome 10/20/2017  . Chronic pain of right lower extremity 10/20/2017  . Chronic myofascial pain 10/20/2017  . Numbness and tingling 07/12/2017  . Sleep difficulties 07/12/2017  . Low back pain 01/18/2017  . Sleep behavior disorder, REM 01/18/2017  . Lumbar radiculopathy 11/01/2016  . Follicular lymphoma of extranodal and solid organ sites [facility]) 05/01/2016  . Lymphoma ([place]) 04/30/2016  . Hallux limitus 07/27/2015  . Low back pain with sciatica 02/13/2015  . Bilateral low back pain with sciatica 02/13/2015  . H/O adenomatous polyp of colon 04/06/2014  . Hx of adenomatous colonic polyps 04/06/2014  . Back ache 01/25/2014  . Adiposity 01/25/2014  . REM sleep behavior disorder 01/25/2014  . Disordered sleep 01/25/2014  . Has a tremor 01/25/2014  . Obesity, unspecified 01/25/2014  . Sleep disorder 01/25/2014  . Backache 01/25/2014  . Tremor 01/25/2014  . Obesity  01/25/2014  . Difficulty hearing 06/08/2012  . Hearing loss 06/08/2012  . Anxiety disorder 06/06/2012  . Colon polyp 06/06/2012  . Clinical depression 06/06/2012  . Hypercholesteremia 06/06/2012  . Idiopathic Parkinson's disease ([place]) 06/06/2012  . Detached retina 06/06/2012  . Depressive disorder 06/06/2012  . Retinal detachment 06/06/2012  . Parkinson's disease ([place]) 06/06/2012    Orientation RESPIRATION BLADDER Height & Weight     Self, Time, Situation, Place  O2(2 Liters Oxygen.) Continent Weight: 150 lb (68 kg) Height:  5\' 6"  (167.6 cm)  BEHAVIORAL SYMPTOMS/MOOD NEUROLOGICAL BOWEL NUTRITION STATUS      Continent Diet(Diet: Regular)  AMBULATORY STATUS COMMUNICATION OF NEEDS Skin   Extensive Assist Verbally Surgical wounds(Incision: Left Knee.)                       Personal Care Assistance Level of Assistance  Bathing, Feeding, Dressing Bathing Assistance: Limited assistance Feeding assistance: Independent Dressing Assistance: Limited assistance     Functional Limitations Info  Sight, Hearing, Speech Sight Info: Adequate Hearing Info: Adequate Speech Info: Adequate    SPECIAL CARE FACTORS FREQUENCY  PT (By licensed PT), OT (By licensed OT)     PT Frequency: 5 OT Frequency: 5            Contractures      Additional Factors Info  Code Status, Allergies Code Status Info: Full Code. Allergies Info: Baclofen, Codeine Sulfate, Gabapentin, Terazosin           Current Medications (06/08/2019):  This is the current hospital active medication list Current  Facility-Administered Medications  Medication Dose Route Frequency Provider Last Rate Last Dose  . acetaminophen (TYLENOL) tablet 325 mg  325 mg Oral Q6H PRN Lovell Sheehan, MD      . alum & mag hydroxide-simeth (MAALOX/MYLANTA) 200-200-20 MG/5ML suspension 30 mL  30 mL Oral Q4H PRN Lovell Sheehan, MD      . aspirin chewable tablet 81 mg  81 mg Oral BID Lovell Sheehan, MD   81 mg at 06/08/19 0905   . bisacodyl (DULCOLAX) suppository 10 mg  10 mg Rectal Daily PRN Lovell Sheehan, MD      . carbidopa-levodopa (SINEMET IR) 25-100 MG per tablet immediate release 2 tablet  2 tablet Oral 6 X Daily Lovell Sheehan, MD   2 tablet at 06/08/19 [id number]  . clonazePAM (KLONOPIN) tablet 1 mg  1 mg Oral QID Lovell Sheehan, MD   1 mg at 06/08/19 [id number]  . diphenhydrAMINE (BENADRYL) 12.5 MG/5ML elixir 12.5 mg  12.5 mg Oral Q4H PRN Lovell Sheehan, MD      . docusate sodium (COLACE) capsule 100 mg  100 mg Oral BID Lovell Sheehan, MD   100 mg at 06/08/19 0904  . furosemide (LASIX) tablet 20 mg  20 mg Oral Daily PRN Lovell Sheehan, MD      . HYDROcodone-acetaminophen [facility]) 7.5-325 MG per tablet 1 tablet  1 tablet Oral Q4H PRN Lovell Sheehan, MD   1 tablet at 06/07/19 1355  . HYDROcodone-acetaminophen (NORCO/VICODIN) 5-325 MG per tablet 1 tablet  1 tablet Oral Q4H PRN Lovell Sheehan, MD      . influenza vaccine adjuvanted (FLUAD) injection 0.5 mL  0.5 mL Intramuscular Tomorrow-1000 Lovell Sheehan, MD      . lactated ringers infusion   Intravenous Continuous Lovell Sheehan, MD 75 mL/hr at 06/08/19 [id number]    . magnesium citrate solution 1 Bottle  1 Bottle Oral Once PRN Lovell Sheehan, MD      . menthol-cetylpyridinium (CEPACOL) lozenge 3 mg  1 lozenge Oral PRN Lovell Sheehan, MD       Or  . phenol (CHLORASEPTIC) mouth spray 1 spray  1 spray Mouth/Throat PRN Lovell Sheehan, MD      . methocarbamol (ROBAXIN) tablet 500 mg  500 mg Oral Q6H PRN Lovell Sheehan, MD   500 mg at 06/08/19 0400   Or  . methocarbamol (ROBAXIN) 500 mg in dextrose 5 % 50 mL IVPB  500 mg Intravenous Q6H PRN Lovell Sheehan, MD      . metoCLOPramide (REGLAN) tablet 5 mg  5 mg Oral Q8H PRN Lovell Sheehan, MD       Or  . metoCLOPramide (REGLAN) injection 5 mg  5 mg Intravenous Q8H PRN Lovell Sheehan, MD      . morphine 2 MG/ML injection 0.5 mg  0.5 mg Intravenous Q2H PRN Lovell Sheehan, MD      . neomycin-polymyxin b-dexamethasone  (MAXITROL) ophthalmic ointment 1 application  1 application Left Eye QHS Lovell Sheehan, MD      . ondansetron [facility]) tablet 4 mg  4 mg Oral Q6H PRN Lovell Sheehan, MD       Or  . ondansetron [facility]) injection 4 mg  4 mg Intravenous Q6H PRN Lovell Sheehan, MD      . pantoprazole (PROTONIX) EC tablet 40 mg  40 mg Oral Daily Lovell Sheehan, MD   40 mg at 06/08/19 [id number]  .  prednisoLONE acetate (PRED FORTE) 1 % ophthalmic suspension 1 drop  1 drop Left Eye Daily Lovell Sheehan, MD      . pregabalin (LYRICA) capsule 50 mg  50 mg Oral TID Lovell Sheehan, MD   50 mg at 06/08/19 0904  . QUEtiapine (SEROQUEL) tablet 25 mg  25 mg Oral QHS Lovell Sheehan, MD   25 mg at 06/07/19 2209  . QUEtiapine (SEROQUEL) tablet 50 mg  50 mg Oral QHS Lovell Sheehan, MD   50 mg at 06/07/19 2209  . traMADol (ULTRAM) tablet 50 mg  50 mg Oral Q6H Lovell Sheehan, MD   50 mg at 06/08/19 [id number]  . traZODone (DESYREL) tablet 75 mg  75 mg Oral QHS Lovell Sheehan, MD         Discharge Medications: Please see discharge summary for a list of discharge medications.  Relevant Imaging Results:  Relevant Lab Results:   Additional Information SSN: [id number]  Deena Shaub, Veronia Beets, [place]

## 2019-06-08 NOTE — Progress Notes (Signed)
Physical Therapy Treatment Patient Details Name: Sarah Donaldson MRN: [id number] DOB: June 17, 1951 Today's Date: 06/08/2019    History of Present Illness Pt is a 68 y.o. female s/p L TKA 06/07/19 secondary unilateral primary OA L knee.  PMH includes Parkinson's disease, h/o lumbar fusion, schizophrenia, anxiety, depression, non-hodgkin's lymphoma (in remission), chronic back pain with symptom's radiating down to B LE's, and hiatal hernia.    PT Comments    Pt able to progress to semi-supine to sit with min to mod assist x1, sit to/from stand with min assist x1 and use of RW, and able to ambulate a few feet bed to recliner with RW min assist x1.  Pt appearing motivated to participate in therapy but overall limited and requiring increased time for activities d/t L knee pain (8/10 end of session--nurse present end of session to give pt pain meds).  L knee flexion AAROM to 85 degrees today but L knee extension 10 degrees short of neutral.  Will continue to focus on strengthening, ROM, and progressive functional mobility per pt tolerance.    Follow Up Recommendations  SNF     Equipment Recommendations  Rolling walker with 5" wheels;3in1 (PT);Wheelchair (measurements PT);Wheelchair cushion (measurements PT)    Recommendations for Other Services OT consult     Precautions / Restrictions Precautions Precautions: Fall;Knee Precaution Booklet Issued: No Restrictions Weight Bearing Restrictions: Yes LLE Weight Bearing: Weight bearing as tolerated    Mobility  Bed Mobility Overal bed mobility: Needs Assistance Bed Mobility: Supine to Sit     Supine to sit: Min assist;Mod assist;HOB elevated     General bed mobility comments: assist for L LE and trunk; increased effort/time to perform; vc's for UE positioning to assist  Transfers Overall transfer level: Needs assistance Equipment used: Rolling walker (2 wheeled) Transfers: Sit to/from Stand Sit to Stand: Min assist;From elevated  surface         General transfer comment: vc's for UE/LE placement; assist to initiate stand up to RW (bed height mildly elevated) and min assist to control descent sitting in recliner  Ambulation/Gait Ambulation/Gait assistance: Min assist Gait Distance (Feet): 3 Feet(bed to recliner) Assistive device: Rolling walker (2 wheeled)   Gait velocity: decreased   General Gait Details: vc's for LE positioning and taking steps; vc's for hand placement on walker and walker management; assist to steady; increased time to perform   Stairs             Wheelchair Mobility    Modified Rankin (Stroke Patients Only)       Balance Overall balance assessment: Needs assistance Sitting-balance support: Bilateral upper extremity supported;Feet supported Sitting balance-Leahy Scale: Poor Sitting balance - Comments: fluctuating between min assist to close SBA sitting balance; vc's to shift weight forward (pt appearing "stiff" with backwards lean) Postural control: Posterior lean   Standing balance-Leahy Scale: Poor Standing balance comment: CGA for safety; B UE support on RW for balance                            Cognition Arousal/Alertness: Awake/alert Behavior During Therapy: Anxious                                   General Comments: Oriented to person, place, time, and situation.  Pt joking intermittently during session.      Exercises Total Joint Exercises Ankle Circles/Pumps: AROM;Strengthening;Both;10 reps;Supine Quad Sets:  AROM;Strengthening;Both;10 reps;Supine Short Arc Quad: AAROM;Strengthening;Left;10 reps;Supine Heel Slides: AAROM;Strengthening;Left;10 reps;Supine Hip ABduction/ADduction: AAROM;Strengthening;Left;10 reps;Supine Straight Leg Raises: AAROM;Strengthening;Left;10 reps;Supine Goniometric ROM: L knee extension 10 degrees short of neutral semi-supine in bed; L knee flexion AAROM to 85 degrees sitting edge of bed    General  Comments General comments (skin integrity, edema, etc.): L knee ace wrap and polar care in place.  Pt agreeable to PT session.     Pertinent Vitals/Pain Pain Assessment: 0-10 Pain Score: 8  Pain Location: L knee Pain Descriptors / Indicators: Aching;Tender;Sore;Discomfort;Constant;Burning Pain Intervention(s): Limited activity within patient's tolerance;Monitored during session;Repositioned;Patient requesting pain meds-RN notified;Other (comment)(RN present end of session to give pt pain meds)    Home Living                      Prior Function            PT Goals (current goals can now be found in the care plan section) Acute Rehab PT Goals Patient Stated Goal: to have less pain and improve mobility PT Goal Formulation: With patient Time For Goal Achievement: 06/21/19 Potential to Achieve Goals: Good Progress towards PT goals: Progressing toward goals    Frequency    BID      PT Plan      Co-evaluation              AM-PAC PT "6 Clicks" Mobility   Outcome Measure  Help needed turning from your back to your side while in a flat bed without using bedrails?: A Little Help needed moving from lying on your back to sitting on the side of a flat bed without using bedrails?: A Little Help needed moving to and from a bed to a chair (including a wheelchair)?: A Little Help needed standing up from a chair using your arms (e.g., wheelchair or bedside chair)?: A Little Help needed to walk in hospital room?: A Little Help needed climbing 3-5 steps with a railing? : Total 6 Click Score: 16    End of Session Equipment Utilized During Treatment: Oxygen;Gait belt Activity Tolerance: Patient limited by pain Patient left: in chair;with call bell/phone within reach;with chair alarm set;with nursing/sitter in room;with SCD's reapplied;Other (comment)(B heels floating via towel rolls; polar care in place and activated) Nurse Communication: Mobility status;Precautions;Patient  requests pain meds;Weight bearing status PT Visit Diagnosis: Other abnormalities of gait and mobility (R26.89);Muscle weakness (generalized) (M62.81);Difficulty in walking, not elsewhere classified (R26.2);Pain Pain - Right/Left: Left Pain - part of body: Knee     Time: [id number] PT Time Calculation (min) (ACUTE ONLY): 38 min  Charges:  $Therapeutic Exercise: 8-22 mins $Therapeutic Activity: 23-37 mins                    Leitha Bleak, PT 06/08/19, 11:45 AM [phone]

## 2019-06-08 NOTE — Progress Notes (Signed)
D: Pt alert and oriented, occasional forgetfulness. Pt uses bedpan. Pt has tremors r/t parkinson s/s.  A: Scheduled medications administered to pt, per MD orders. Support and encouragement provided. Frequent verbal contact made.   R: No adverse drug reactions noted. Pt complaint with medications and treatment plan. Pt interacts well with staff on the unit. Pt is stable, will continue to monitor and provide care as ordered.

## 2019-06-08 NOTE — TOC Initial Note (Signed)
Transition of Care [facility]) - Initial/Assessment Note    Patient Details  Name: DUNG PRIEN MRN: [id number] Date of Birth: Jul 02, 1951  Transition of Care [facility]) CM/SW Contact:    Lux Skilton, Lenice Llamas Phone Number: [phone]  06/08/2019, 5:20 PM  Clinical Narrative: PT is recommending SNF. Clinical Education officer, museum (CSW) met with patient alone at bedside to discuss D/C plan. Patient was alert and oriented X3 and was laying in the bed. CSW introduced self and explained role of CSW department. Patient reported that she lives in [place] with her husband [name] and wants to go to SNF. CSW explained that medicare requires a 3 night qualifying inpatient hospital stay in order to pay for SNF. Patient was admitted to inpatient 10/28. Patient verbalized her understanding and is agreeable to SNF search in [place] and [place]. FL2 complete and faxed out.   CSW presented bed offers to patient and provided CMS SNF list. Patient chose [facility]. CSW left [facility] admissions coordinator at [facility] a voicemail making her aware of above. CSW requested for MD to order another covid test. CSW will continue to follow and assist as needed.              Expected Discharge Plan: Skilled Nursing Facility Barriers to Discharge: Continued Medical Work up   Patient Goals and CMS Choice Patient states their goals for this hospitalization and ongoing recovery are:: Pain control. CMS Medicare.gov Compare Post Acute Care list provided to:: Patient Choice offered to / list presented to : Patient  Expected Discharge Plan and Services Expected Discharge Plan: [place] In-house Referral: Clinical Social Work   Post Acute Care Choice: [place] Living arrangements for the past 2 months: [place]                 DME Arranged: N/A         HH Arranged: NA          Prior Living Arrangements/Services Living arrangements for the past 2 months:  Single Family Home Lives with:: Spouse Patient language and need for interpreter reviewed:: No Do you feel safe going back to the place where you live?: Yes      Need for Family Participation in Patient Care: Yes (Comment) Care giver support system in place?: Yes (comment)   Criminal Activity/Legal Involvement Pertinent to Current Situation/Hospitalization: No - Comment as needed  Activities of Daily Living Home Assistive Devices/Equipment: Eyeglasses, Contact lenses, Wheelchair, Radio producer (specify quad or straight), Shower chair with back, Environmental consultant (specify type) ADL Screening (condition at time of admission) Patient's cognitive ability adequate to safely complete daily activities?: Yes Is the patient deaf or have difficulty hearing?: No Does the patient have difficulty seeing, even when wearing glasses/contacts?: No Does the patient have difficulty concentrating, remembering, or making decisions?: No Patient able to express need for assistance with ADLs?: Yes Does the patient have difficulty dressing or bathing?: Yes Independently performs ADLs?: Yes (appropriate for developmental age) Does the patient have difficulty walking or climbing stairs?: Yes Weakness of Legs: Both Weakness of Arms/Hands: None  Permission Sought/Granted Permission sought to share information with : Chartered certified accountant granted to share information with : Yes, Verbal Permission Granted              Emotional Assessment Appearance:: Appears stated age Attitude/Demeanor/Rapport: Engaged Affect (typically observed): Pleasant, Calm Orientation: : Oriented to Self, Oriented to Place, Oriented to  Time, Oriented to Situation Alcohol / Substance Use:  Not Applicable Psych Involvement: No (comment)  Admission diagnosis:  M17.12 unilateral primary osteoarthritis left knee Patient Active Problem List   Diagnosis Date Noted  . S/P total knee replacement, left 06/07/2019  . Panic attacks  04/07/2019  . Psychosis ([place]) 04/04/2019  . Numbness 03/09/2018  . Lumbar spondylosis 03/07/2018  . Chronic pain of left knee 03/07/2018  . Chronic upper back pain 12/08/2017  . Benign neoplasm of hand 10/20/2017  . Chronic pain syndrome 10/20/2017  . Chronic pain of right lower extremity 10/20/2017  . Chronic myofascial pain 10/20/2017  . Numbness and tingling 07/12/2017  . Sleep difficulties 07/12/2017  . Low back pain 01/18/2017  . Sleep behavior disorder, REM 01/18/2017  . Lumbar radiculopathy 11/01/2016  . Follicular lymphoma of extranodal and solid organ sites [facility]) 05/01/2016  . Lymphoma ([place]) 04/30/2016  . Hallux limitus 07/27/2015  . Low back pain with sciatica 02/13/2015  . Bilateral low back pain with sciatica 02/13/2015  . H/O adenomatous polyp of colon 04/06/2014  . Hx of adenomatous colonic polyps 04/06/2014  . Back ache 01/25/2014  . Adiposity 01/25/2014  . REM sleep behavior disorder 01/25/2014  . Disordered sleep 01/25/2014  . Has a tremor 01/25/2014  . Obesity, unspecified 01/25/2014  . Sleep disorder 01/25/2014  . Backache 01/25/2014  . Tremor 01/25/2014  . Obesity 01/25/2014  . Difficulty hearing 06/08/2012  . Hearing loss 06/08/2012  . Anxiety disorder 06/06/2012  . Colon polyp 06/06/2012  . Clinical depression 06/06/2012  . Hypercholesteremia 06/06/2012  . Idiopathic Parkinson's disease ([place]) 06/06/2012  . Detached retina 06/06/2012  . Depressive disorder 06/06/2012  . Retinal detachment 06/06/2012  . Parkinson's disease ([place]) 06/06/2012   PCP:  Cletis Athens, MD Pharmacy:   [id number] [place] [address]Phone: [phone]: [phone] [facility] [address] [place]Phone: [phone]: [phone] [facility] [address] [address] [facility]Phone: [phone]: [phone]    Social Determinants of Health (SDOH) Interventions    Readmission Risk Interventions No flowsheet data found.

## 2019-06-08 NOTE — Anesthesia Postprocedure Evaluation (Signed)
Anesthesia Post Note  Patient: [name]  Procedure(s) Performed: TOTAL KNEE ARTHROPLASTY (Left Knee)  Patient location during evaluation: Nursing Unit Anesthesia Type: Spinal Level of consciousness: oriented and awake and alert Pain management: pain level controlled Vital Signs Assessment: post-procedure vital signs reviewed and stable Respiratory status: spontaneous breathing and respiratory function stable Cardiovascular status: blood pressure returned to baseline and stable Postop Assessment: no headache, no backache, no apparent nausea or vomiting and patient able to bend at knees Anesthetic complications: no     Last Vitals:  Vitals:   06/08/19 0358 06/08/19 0400  BP: (!) 163/95 (!) 152/91  Pulse: 84 79  Resp: 18   Temp: 36.7 C   SpO2: 100%     Last Pain:  Vitals:   06/08/19 0358  TempSrc: Oral  PainSc:                  Caryl Asp

## 2019-06-08 NOTE — Progress Notes (Signed)
OT Cancellation Note  Patient Details Name: Sarah Donaldson MRN: [id number] DOB: [birth year]   Cancelled Treatment:    Reason Eval/Treat Not Completed: Other (comment). Consult received, chart reviewed. Pt just starting breakfast. Will re-attempt OT evaluation at later time.   Jeni Salles, MPH, MS, OTR/L ascom [phone] 06/08/19, 10:26 AM

## 2019-06-08 NOTE — Evaluation (Signed)
Occupational Therapy Evaluation Patient Details Name: Sarah Donaldson MRN: [id number] DOB: [birth year] Today's Date: 06/08/2019    History of Present Illness Pt is a 68 y.o. female s/p L TKA 06/07/19 secondary unilateral primary OA L knee.  PMH includes Parkinson's disease, h/o lumbar fusion, schizophrenia, anxiety, depression, non-hodgkin's lymphoma (in remission), chronic back pain with symptom's radiating down to B LE's, and hiatal hernia.   Clinical Impression   Pt seen for OT evaluation this date, POD#1 from above surgery. Pt was receiving assist from spouse for past 2+ mo for ADL and all mobility 2/2 severe L knee pain prior to surgery. Pt primarily using w/c or scooter for ADL mobility. Pt is eager to return to PLOF with less pain and improved safety and independence. Pt currently requires minimal assist for LB dressing while in seated position due to pain and limited AROM of L knee. 89% O2 on RA at rest and during mobility. With O2 re-applied and cues for PLB, O2 sats increased to 98% quickly. Pt instructed in polar care mgt, falls prevention strategies, home/routines modifications, DME/AE for LB bathing and dressing tasks, and functional transfer training. Handout provided. Pt would benefit from skilled OT services including additional instruction in dressing techniques with or without assistive devices for dressing and bathing skills to support recall and carryover prior to discharge and ultimately to maximize safety, independence, and minimize falls risk and caregiver burden. Pt will benefit from skilled OT Services in SNF setting upon discharge.      Follow Up Recommendations  SNF    Equipment Recommendations  3 in 1 bedside commode    Recommendations for Other Services       Precautions / Restrictions Precautions Precautions: Fall;Knee Precaution Booklet Issued: Yes (comment) Restrictions Weight Bearing Restrictions: Yes LLE Weight Bearing: Weight bearing as tolerated       Mobility Bed Mobility Overal bed mobility: Needs Assistance Bed Mobility: Supine to Sit     Supine to sit: Min assist;Mod assist;HOB elevated     General bed mobility comments: deferred, up in recliner at start, EOB at end with PT  Transfers Overall transfer level: Needs assistance Equipment used: Rolling walker (2 wheeled) Transfers: Sit to/from Stand Sit to Stand: Min assist;From elevated surface         General transfer comment: +1 for IV pole mgt    Balance Overall balance assessment: Needs assistance Sitting-balance support: Bilateral upper extremity supported;Feet supported Sitting balance-Leahy Scale: Poor Sitting balance - Comments: preference for posterior lean Postural control: Posterior lean Standing balance support: Bilateral upper extremity supported Standing balance-Leahy Scale: Fair Standing balance comment: heavy BUE reliance on RW                           ADL either performed or assessed with clinical judgement   ADL Overall ADL's : Needs assistance/impaired Eating/Feeding: Set up Eating/Feeding Details (indicate cue type and reason): difficulty with opening packets/fruit cup Grooming: Set up;Sitting   Upper Body Bathing: Sitting;Moderate assistance;Minimal assistance   Lower Body Bathing: Sitting/lateral leans;Moderate assistance;Maximal assistance   Upper Body Dressing : Sitting;Minimal assistance;Moderate assistance   Lower Body Dressing: Sitting/lateral leans;Moderate assistance;Maximal assistance   Toilet Transfer: Min Forensic psychologist Details (indicate cue type and reason): cues for hand placement                 Vision Patient Visual Report: No change from baseline       Perception     Praxis  Pertinent Vitals/Pain Pain Assessment: 0-10 Pain Score: 8  Pain Location: L knee Pain Descriptors / Indicators: Aching;Tender;Sore;Discomfort;Constant;Burning Pain Intervention(s): Limited activity  within patient's tolerance;Monitored during session;Repositioned;Ice applied     Hand Dominance     Extremity/Trunk Assessment Upper Extremity Assessment Upper Extremity Assessment: Generalized weakness   Lower Extremity Assessment Lower Extremity Assessment: Generalized weakness   Cervical / Trunk Assessment Cervical / Trunk Assessment: Normal   Communication Communication Communication: (gets breathy/low volume at times)   Cognition Arousal/Alertness: Awake/alert Behavior During Therapy: Anxious Overall Cognitive Status: Within Functional Limits for tasks assessed                                 General Comments: Oriented to person, place, time, and situation.  Pt joking intermittently during session.   General Comments  L knee ace wrap and polar care in place    Exercises Other Exercises: Pt instructed in home/routines modifications, AE/DME, falls prevention strategies, polar care mgt, and functional transfer training Other Exercises: functional transfer training and mobility with PT   Shoulder Instructions      Home Living Family/patient expects to be discharged to:: Private residence Living Arrangements: Spouse/significant other Available Help at Discharge: Family Type of Home: House Home Access: Stairs to enter Technical brewer of Steps: 3 Entrance Stairs-Rails: Right Home Layout: One level     Bathroom Shower/Tub: Occupational psychologist: [place]: Grab bars - tub/shower;Grab bars - toilet;Walker - 2 wheels;Electric scooter;Wheelchair - manual   Additional Comments: sleigh bed (with side rail but is angled d/t bed set-up)      Prior Functioning/Environment Level of Independence: Needs assistance  Gait / Transfers Assistance Needed: Pt reports being able to walk with RW (on own) about 2 months ago but has had increasing difficulty since and has been needing to use manual w/c or scooter for last 2 weeks with  significant assist of husband for transfers ADL's / Homemaking Assistance Needed: Pt reports husband helps with bathing and dressing            OT Problem List: Decreased strength;Pain;Decreased range of motion;Impaired balance (sitting and/or standing);Decreased knowledge of use of DME or AE      OT Treatment/Interventions: Self-care/ADL training;Therapeutic exercise;Therapeutic activities;DME and/or AE instruction;Patient/family education;Balance training    OT Goals(Current goals can be found in the care plan section) Acute Rehab OT Goals Patient Stated Goal: less knee pain and be more independent OT Goal Formulation: With patient Time For Goal Achievement: 06/22/19 Potential to Achieve Goals: Good ADL Goals Pt Will Perform Lower Body Dressing: with caregiver independent in assisting;with adaptive equipment;sit to/from stand Pt Will Transfer to Toilet: ambulating;with supervision(BSC over toilet, LRAD for amb) Additional ADL Goal #1: Pt will independently instruct family in polar care mgt Additional ADL Goal #2: Pt will independently instruct family/caregiver in compression stocking mgt  OT Frequency: Min 1X/week   Barriers to D/C:            Co-evaluation PT/OT/SLP Co-Evaluation/Treatment: Yes Reason for Co-Treatment: For patient/therapist safety;To address functional/ADL transfers PT goals addressed during session: Mobility/safety with mobility;Balance;Proper use of DME OT goals addressed during session: Proper use of Adaptive equipment and DME;ADL's and self-care      AM-PAC OT "6 Clicks" Daily Activity     Outcome Measure Help from another person eating meals?: None Help from another person taking care of personal grooming?: None Help from another person  toileting, which includes using toliet, bedpan, or urinal?: A Little Help from another person bathing (including washing, rinsing, drying)?: A Lot Help from another person to put on and taking off regular upper body  clothing?: A Little Help from another person to put on and taking off regular lower body clothing?: A Lot 6 Click Score: 18   End of Session Equipment Utilized During Treatment: Gait belt;Rolling walker;Oxygen  Activity Tolerance: Patient tolerated treatment well Patient left: in bed;with call bell/phone within reach;Other (comment)(seated EOB with PT)  OT Visit Diagnosis: Other abnormalities of gait and mobility (R26.89);Pain;Muscle weakness (generalized) (M62.81) Pain - Right/Left: Left Pain - part of body: Knee                Time: 1330-1403 OT Time Calculation (min): 33 min Charges:  OT General Charges $OT Visit: 1 Visit OT Evaluation $OT Eval Moderate Complexity: 1 Mod OT Treatments $Self Care/Home Management : 8-22 mins  Jeni Salles, MPH, MS, OTR/L ascom [phone] 06/08/19, 2:41 PM

## 2019-06-08 NOTE — Progress Notes (Signed)
Physical Therapy Treatment Patient Details Name: DARNESHA BEMIS MRN: [id number] DOB: 10/06/1950 Today's Date: 06/08/2019    History of Present Illness Pt is a 68 y.o. female s/p L TKA 06/07/19 secondary unilateral primary OA L knee.  PMH includes Parkinson's disease, h/o lumbar fusion, schizophrenia, anxiety, depression, non-hodgkin's lymphoma (in remission), chronic back pain with symptom's radiating down to B LE's, and hiatal hernia.    PT Comments    Pt able to progress to ambulating 40 feet with RW min assist x1 plus 2nd assist for IV pole management; consistent cueing required for walker management, positioning within RW, and gait pattern; increased time required to perform activities d/t cueing required and L knee pain.  Will continue to progress pt with strengthening, ROM, and progressive functional mobility per pt tolerance.    Follow Up Recommendations  SNF     Equipment Recommendations  Rolling walker with 5" wheels;3in1 (PT);Wheelchair (measurements PT);Wheelchair cushion (measurements PT)    Recommendations for Other Services OT consult     Precautions / Restrictions Precautions Precautions: Fall;Knee Precaution Booklet Issued: Yes (comment) Restrictions Weight Bearing Restrictions: Yes LLE Weight Bearing: Weight bearing as tolerated    Mobility  Bed Mobility Overal bed mobility: Needs Assistance Bed Mobility: Sit to Supine       Sit to supine: Min assist;Mod assist   General bed mobility comments: assist for L LE and trunk; vc's for technique  Transfers Overall transfer level: Needs assistance Equipment used: Rolling walker (2 wheeled) Transfers: Sit to/from Stand Sit to Stand: Min assist;+2 safety/equipment(2nd assist for IV pole management)         General transfer comment: assist to initiate stand from recliner; vc's for UE/LE placement; min assist to control descent sitting down onto bed  Ambulation/Gait Ambulation/Gait assistance: Min  assist Gait Distance (Feet): 40 Feet Assistive device: Rolling walker (2 wheeled)   Gait velocity: decreased   General Gait Details: vc's and assist for walking positioning; vc's for UE placement on walker and stepping pattern; assist to steady; increased time to perform; decreased stance time L LE   Stairs             Wheelchair Mobility    Modified Rankin (Stroke Patients Only)       Balance Overall balance assessment: Needs assistance Sitting-balance support: Single extremity supported;Feet supported Sitting balance-Leahy Scale: Poor Sitting balance - Comments: pt requiring at least single UE support for static sitting balance (pt tending to lean backwards in sitting requiring vc's to shift weight forward to sit upright) Postural control: Posterior lean Standing balance support: Bilateral upper extremity supported Standing balance-Leahy Scale: Poor Standing balance comment: pt requiring B UE support on RW for static balance                            Cognition Arousal/Alertness: Awake/alert Behavior During Therapy: Anxious Overall Cognitive Status: Within Functional Limits for tasks assessed                                 General Comments: Oriented to person, place, time, and situation.  Pt joking intermittently during session.      Exercises Total Joint Exercises Long Arc Quad: AAROM;Strengthening;Left;10 reps;Seated Knee Flexion: AAROM;Strengthening;Left;10 reps;Seated General Exercises - Lower Extremity Hip Flexion/Marching: AAROM;Strengthening;Left;10 reps;Seated    General Comments General comments (skin integrity, edema, etc.): L knee ace wrap and polar care in place.  Pt  agreeable to PT session (OT/PT co-treat for transfers/ambulation).      Pertinent Vitals/Pain Pain Assessment: No/denies pain Pain Score: 10-Worst pain ever Pain Location: L knee Pain Descriptors / Indicators:  Aching;Tender;Sore;Discomfort;Constant;Burning Pain Intervention(s): Limited activity within patient's tolerance;Monitored during session;Repositioned;Patient requesting pain meds-RN notified;Other (comment)(polar care applied and activated)  Pt's HR stable and WFL throughout treatment session.  O2 sats 89% or greater on room air with activity.    Home Living    Prior Function      PT Goals (current goals can now be found in the care plan section) Acute Rehab PT Goals Patient Stated Goal: to have less knee pain and improve mobility PT Goal Formulation: With patient Time For Goal Achievement: 06/21/19 Potential to Achieve Goals: Good Progress towards PT goals: Progressing toward goals    Frequency    BID      PT Plan Current plan remains appropriate    Co-evaluation PT/OT/SLP Co-Evaluation/Treatment: Yes Reason for Co-Treatment: For patient/therapist safety;To address functional/ADL transfers PT goals addressed during session: Mobility/safety with mobility;Balance;Proper use of DME OT goals addressed during session: Proper use of Adaptive equipment and DME;ADL's and self-care      AM-PAC PT "6 Clicks" Mobility   Outcome Measure  Help needed turning from your back to your side while in a flat bed without using bedrails?: A Little Help needed moving from lying on your back to sitting on the side of a flat bed without using bedrails?: A Little Help needed moving to and from a bed to a chair (including a wheelchair)?: A Little Help needed standing up from a chair using your arms (e.g., wheelchair or bedside chair)?: A Little Help needed to walk in hospital room?: A Little Help needed climbing 3-5 steps with a railing? : Total 6 Click Score: 16    End of Session Equipment Utilized During Treatment: Gait belt;Oxygen Activity Tolerance: Patient limited by pain Patient left: in bed;with call bell/phone within reach;with bed alarm set;with family/visitor present;with SCD's  reapplied;Other (comment)(B heels floating via towel rolls; polar care in place and activated) Nurse Communication: Mobility status;Precautions;Patient requests pain meds;Weight bearing status PT Visit Diagnosis: Other abnormalities of gait and mobility (R26.89);Muscle weakness (generalized) (M62.81);Difficulty in walking, not elsewhere classified (R26.2);Pain Pain - Right/Left: Left Pain - part of body: Knee     Time: [id number] PT Time Calculation (min) (ACUTE ONLY): 38 min  Charges:  $Gait Training: 8-22 mins $Therapeutic Exercise: 8-22 mins $Therapeutic Activity: 8-22 mins                     Leitha Bleak, PT 06/08/19, 5:11 PM [phone]

## 2019-06-09 LAB — CBC
HCT: 35.3 % — ABNORMAL LOW (ref 36.0–46.0)
Hemoglobin: 11 g/dL — ABNORMAL LOW (ref 12.0–15.0)
MCH: 28.3 pg (ref 26.0–34.0)
MCHC: 31.2 g/dL (ref 30.0–36.0)
MCV: 90.7 fL (ref 80.0–100.0)
Platelets: 166 10*3/uL (ref 150–400)
RBC: 3.89 MIL/uL (ref 3.87–5.11)
RDW: 14.6 % (ref 11.5–15.5)
WBC: 8.2 10*3/uL (ref 4.0–10.5)
nRBC: 0 % (ref 0.0–0.2)

## 2019-06-09 LAB — SARS CORONAVIRUS 2 (TAT 6-24 HRS): SARS Coronavirus 2: NEGATIVE

## 2019-06-09 MED ORDER — ASPIRIN 81 MG PO CHEW: 81.0000 mg | CHEWABLE_TABLET | Freq: Two times a day (BID) | ORAL | 1 refills | Status: DC

## 2019-06-09 MED ORDER — TRAMADOL HCL 50 MG PO TABS: 50.0000 mg | ORAL_TABLET | Freq: Four times a day (QID) | ORAL | 1 refills | Status: DC

## 2019-06-09 MED ORDER — DOCUSATE SODIUM 100 MG PO CAPS: 100.0000 mg | ORAL_CAPSULE | Freq: Two times a day (BID) | ORAL | 0 refills | Status: DC

## 2019-06-09 MED ORDER — CARBIDOPA-LEVODOPA 25-100 MG PO TABS
1.0000 | ORAL_TABLET | Freq: Every evening | ORAL | Status: DC | PRN
  Administered 2019-06-09: 1 via ORAL
  Filled 2019-06-09 (×2): qty 2

## 2019-06-09 MED ORDER — METHOCARBAMOL 500 MG PO TABS: 500.0000 mg | ORAL_TABLET | Freq: Four times a day (QID) | ORAL | 0 refills | Status: DC | PRN

## 2019-06-09 MED ORDER — METOCLOPRAMIDE HCL 5 MG PO TABS: 5.0000 mg | ORAL_TABLET | Freq: Three times a day (TID) | ORAL | 0 refills | Status: DC | PRN

## 2019-06-09 MED ORDER — HYDROCODONE-ACETAMINOPHEN 5-325 MG PO TABS: 1.0000 | ORAL_TABLET | ORAL | 0 refills | Status: DC | PRN

## 2019-06-09 NOTE — Care Management Important Message (Signed)
Important Message  Patient Details  Name: Sarah Donaldson MRN: [id number] Date of Birth: 03/03/1951   Medicare Important Message Given:  Yes     Juliann Pulse A Danny Zimny 06/09/2019, 11:51 AM

## 2019-06-09 NOTE — TOC Progression Note (Signed)
Transition of Care [facility]) - Progression Note    Patient Details  Name: Sarah Donaldson MRN: [id number] Date of Birth: Nov 08, 1950  Transition of Care [facility]) CM/SW Contact  Katrisha Segall, Lenice Llamas Phone Number: [phone]  06/09/2019, 11:54 AM  Clinical Narrative: Per Neoma Laming admissions coordinator at [facility] patient can come to [facility] over the weekend if medically stable to room 202-B. Clinical Education officer, museum (CSW) sent D/C summary and H&P to [employer] today. Patient is aware of above. CSW contacted patient's husband Sarah Donaldson and made him aware of above. CSW also made [name] aware that he will have to go over to [facility] to complete admissions paper work. [name] verbalized his understanding. CSW will continue to follow and assist as needed.     Expected Discharge Plan: [place] Barriers to Discharge: Continued Medical Work up  Expected Discharge Plan and Services Expected Discharge Plan: [place] In-house Referral: Clinical Social Work   Post Acute Care Choice: [place] Living arrangements for the past 2 months: Single Family Home                 DME Arranged: N/A         HH Arranged: NA           Social Determinants of Health (SDOH) Interventions    Readmission Risk Interventions No flowsheet data found.

## 2019-06-09 NOTE — Plan of Care (Signed)
  Problem: Education: Goal: Knowledge of General Education information will improve Description: Including pain rating scale, medication(s)/side effects and non-pharmacologic comfort measures Outcome: Progressing   Problem: Clinical Measurements: Goal: Ability to maintain clinical measurements within normal limits will improve Outcome: Progressing Goal: Will remain free from infection Outcome: Progressing   Problem: Pain Managment: Goal: General experience of comfort will improve Outcome: Progressing   Problem: Safety: Goal: Ability to remain free from injury will improve Outcome: Progressing

## 2019-06-09 NOTE — Discharge Instructions (Signed)
Continue weight bear as tolerated on the left lower extremity.    Elevate the left lower extremity whenever possible and continue the polar care while elevating the extremity. Patient may shower. No bath or submerging the wound.    Take Aspirin as directed for blood clot prevention.  Continue to work on knee range of motion exercises at home as instructed by physical therapy. Continue to use a walker for assistance with ambulation until cleared by physical therapy.  Call [phone] with any questions, such as fever > 101.5 degrees, drainage from the wound or shortness of breath.  F/U in office in 2 weeks for staple removal [phone]

## 2019-06-09 NOTE — Progress Notes (Signed)
This RN went into pt's room after yelling could be heard from nursing desk. Asked pt if she was okay and pt began asking where was her husband and who was he with? Pt kept repeating this to staff. Reminded pt that she was in the hospital and her husband was at home getting rest. Talked with pt about shouting and pt stopped yelling and talked appropriately. Husband called desk about pt calling him repeatedly and yelling at him. pts husband requested we take pts phone away and [facility] was notified also, and talked with family and staff. Pdowless, rn

## 2019-06-09 NOTE — Progress Notes (Signed)
Pt agreed to take morphine and Carbidopa as ordered. Pt is more appropriate at this time. Able to use bedpan without difficulty. Pdowless, rn

## 2019-06-09 NOTE — Progress Notes (Signed)
Pt offered morphine and carbidopa. Pt refused. Pt given Norco as ordered. Will monitor pt for improvement. Pdowless, rn

## 2019-06-09 NOTE — Discharge Summary (Signed)
Physician Discharge Summary  Patient ID: Sarah Donaldson MRN: [id number] DOB/AGE: May 04, 1951 68 y.o.  Admit date: 06/07/2019 Discharge date: 06/09/2019  Admission Diagnoses:  M17.12 unilateral primary osteoarthritis left knee <principal problem not specified>  Discharge Diagnoses:  M17.12 unilateral primary osteoarthritis left knee Active Problems:   S/P total knee replacement, left   Past Medical History:  Diagnosis Date  . Cancer ([place])    Non-hodgkin's lymphoma (in remission)  . GERD (gastroesophageal reflux disease)    well-controlled w/ omeprazole  . Hyperlipidemia   . Knee joint cyst, left 02/07/2018  . Parkinson disease ([place])   . Parkinson disease ([place])     Surgeries: Procedure(s): TOTAL KNEE ARTHROPLASTY on 06/07/2019   Consultants (if any):   Discharged Condition: Improved  Hospital Course: Sarah Donaldson is an 68 y.o. female who was admitted 06/07/2019 with a diagnosis of  M17.12 unilateral primary osteoarthritis left knee <principal problem not specified> and went to the operating room on 06/07/2019 and underwent the above named procedures.    She was given perioperative antibiotics:  Anti-infectives (From admission, onward)   Start     Dose/Rate Route Frequency Ordered Stop   06/07/19 1500  ceFAZolin (ANCEF) IVPB 2g/100 mL premix     2 g 200 mL/hr over 30 Minutes Intravenous Every 6 hours 06/07/19 1218 06/07/19 2121   06/07/19 0900  50,000 units bacitracin in 0.9% normal saline 250 mL irrigation  Status:  Discontinued       As needed 06/07/19 0936 06/07/19 1022   06/07/19 0633  ceFAZolin (ANCEF) 2-4 GM/100ML-% IVPB    Note to Pharmacy: Milinda Cave   : cabinet override      06/07/19 0633 06/07/19 0841   06/07/19 0615  ceFAZolin (ANCEF) IVPB 2g/100 mL premix     2 g 200 mL/hr over 30 Minutes Intravenous On call to O.R. 06/07/19 [id number] 06/07/19 0848    .  She was given sequential compression devices, early ambulation, and Aspirin for DVT  prophylaxis.  She benefited maximally from the hospital stay and there were no complications.    Recent vital signs:  Vitals:   06/08/19 0902 06/08/19 1600  BP: 135/82 124/78  Pulse: 92 97  Resp: 18 15  Temp: 98.7 F (37.1 C) 98.3 F (36.8 C)  SpO2: 98% 95%    Recent laboratory studies:  Lab Results  Component Value Date   HGB 11.0 (L) 06/09/2019   HGB 11.8 (L) 06/08/2019   HGB 13.2 05/31/2019   Lab Results  Component Value Date   WBC 8.2 06/09/2019   PLT 166 06/09/2019   Lab Results  Component Value Date   INR 0.9 05/31/2019   Lab Results  Component Value Date   NA 142 06/08/2019   K 3.7 06/08/2019   CL 106 06/08/2019   CO2 28 06/08/2019   BUN 23 06/08/2019   CREATININE 0.46 06/08/2019   GLUCOSE 102 (H) 06/08/2019    Discharge Medications:   Allergies as of 06/09/2019      Reactions   Baclofen    Caused numbness in mouth    Codeine Sulfate Nausea And Vomiting   Gabapentin Swelling   Mouth swelling    Terazosin       Medication List    TAKE these medications   acetaminophen 650 MG CR tablet Commonly known as: TYLENOL Take 1,300 mg by mouth every 8 (eight) hours as needed for pain.   aspirin 81 MG chewable tablet Chew 1 tablet (81 mg total) by mouth 2 (  two) times daily.   carbidopa-levodopa 25-100 MG tablet Commonly known as: SINEMET IR Take 2 tablets by mouth 6 (six) times daily. Take 2 tablets q2 hours between 0800 and 2000.  Pt can take an extra 1-2 tabs at night as needed for tightness   Clinpro 5000 1.1 % Pste Generic drug: Sodium Fluoride Place 1 application onto teeth at bedtime.   clonazePAM 1 MG tablet Commonly known as: KLONOPIN Take 1 mg by mouth 4 (four) times daily.   diclofenac 75 MG EC tablet Commonly known as: VOLTAREN Take 75 mg by mouth 2 (two) times daily.   docusate sodium 100 MG capsule Commonly known as: COLACE Take 1 capsule (100 mg total) by mouth 2 (two) times daily.   furosemide 20 MG tablet Commonly known  as: LASIX Take 20 mg by mouth daily as needed for fluid.   HYDROcodone-acetaminophen 5-325 MG tablet Commonly known as: NORCO/VICODIN Take 1-2 tablets by mouth every 4 (four) hours as needed (pain score 4-6).   methocarbamol 500 MG tablet Commonly known as: ROBAXIN Take 1 tablet (500 mg total) by mouth every 6 (six) hours as needed for muscle spasms.   metoCLOPramide 5 MG tablet Commonly known as: REGLAN Take 1 tablet (5 mg total) by mouth every 8 (eight) hours as needed for nausea (if ondansetron (ZOFRAN) ineffective.).   neomycin-polymyxin b-dexamethasone 3.5-10000-0.1 Oint Commonly known as: MAXITROL Place 1 application into the left eye at bedtime.   omeprazole 20 MG capsule Commonly known as: PRILOSEC Take 1 capsule (20 mg total) by mouth daily.   prednisoLONE acetate 1 % ophthalmic suspension Commonly known as: PRED FORTE Place 1 drop into the left eye daily.   pregabalin 50 MG capsule Commonly known as: Lyrica BID to TID prn What changed:   how much to take  when to take this  additional instructions   QUEtiapine 25 MG tablet Commonly known as: SEROQUEL Take 25 mg by mouth at bedtime.   QUEtiapine 50 MG tablet Commonly known as: SEROquel Take 1 tablet (50 mg total) by mouth at bedtime.   traMADol 50 MG tablet Commonly known as: ULTRAM Take 1 tablet (50 mg total) by mouth every 6 (six) hours.   traZODone 50 MG tablet Commonly known as: DESYREL Take 75 mg by mouth at bedtime.       Diagnostic Studies: Dg Knee Left Port  Result Date: 06/07/2019 CLINICAL DATA:  Status post left knee arthroplasty. EXAM: PORTABLE LEFT KNEE - 1-2 VIEW COMPARISON:  None. FINDINGS: Two views demonstrate normal alignment status post total left knee arthroplasty. There is some air and fluid in the joint space. IMPRESSION: Normal alignment of total left knee arthroplasty. Electronically Signed   By: Aletta Edouard M.D.   On: 06/07/2019 11:23   [place] Or Nerve Block-image Only  ([facility])  Result Date: 06/07/2019 There is no interpretation for this exam.  This order is for images obtained during a surgical procedure.  Please See "Surgeries" Tab for more information regarding the procedure.    Disposition:     Contact information for after-discharge care    Destination    [facility] .   Service: Skilled Nursing Contact information: [address] [place] [phone]               Signed: Carlynn Spry ,PA-C 06/09/2019, 7:50 AM

## 2019-06-09 NOTE — Progress Notes (Signed)
  Subjective:  Patient reports pain as moderate.    Objective:   VITALS:   Vitals:   06/08/19 0358 06/08/19 0400 06/08/19 0902 06/08/19 1600  BP: (!) 163/95 (!) 152/91 135/82 124/78  Pulse: 84 79 92 97  Resp: 18  18 15   Temp: 98 F (36.7 C)  98.7 F (37.1 C) 98.3 F (36.8 C)  TempSrc: Oral  Oral Oral  SpO2: 100%  98% 95%  Weight:      Height:        PHYSICAL EXAM:  Neurologically intact ABD soft Neurovascular intact Sensation intact distally Intact pulses distally Dorsiflexion/Plantar flexion intact Incision: dressing changed No cellulitis present Compartment soft  LABS  Results for orders placed or performed during the hospital encounter of 06/07/19 (from the past 24 hour(s))  CBC     Status: Abnormal   Collection Time: 06/09/19  4:57 AM  Result Value Ref Range   WBC 8.2 4.0 - 10.5 K/uL   RBC 3.89 3.87 - 5.11 MIL/uL   Hemoglobin 11.0 (L) 12.0 - 15.0 g/dL   HCT 35.3 (L) 36.0 - 46.0 %   MCV 90.7 80.0 - 100.0 fL   MCH 28.3 26.0 - 34.0 pg   MCHC 31.2 30.0 - 36.0 g/dL   RDW 14.6 11.5 - 15.5 %   Platelets 166 150 - 400 K/uL   nRBC 0.0 0.0 - 0.2 %    Dg Knee Left Port  Result Date: 06/07/2019 CLINICAL DATA:  Status post left knee arthroplasty. EXAM: PORTABLE LEFT KNEE - 1-2 VIEW COMPARISON:  None. FINDINGS: Two views demonstrate normal alignment status post total left knee arthroplasty. There is some air and fluid in the joint space. IMPRESSION: Normal alignment of total left knee arthroplasty. Electronically Signed   By: Aletta Edouard M.D.   On: 06/07/2019 11:23    Assessment/Plan: 2 Days Post-Op   Active Problems:   S/P total knee replacement, left   Advance diet Up with therapy Discharge to SNF Saturday   Carlynn Spry , PA-C 06/09/2019, 7:43 AM

## 2019-06-09 NOTE — Progress Notes (Signed)
Physical Therapy Treatment Patient Details Name: Sarah Donaldson MRN: [id number] DOB: 1950/09/23 Today's Date: 06/09/2019    History of Present Illness Pt is a 68 y.o. female s/p L TKA 06/07/19 secondary unilateral primary OA L knee.  PMH includes Parkinson's disease, h/o lumbar fusion, schizophrenia, anxiety, depression, non-hodgkin's lymphoma (in remission), chronic back pain with symptom's radiating down to B LE's, and hiatal hernia.    PT Comments    Pt sitting up in recliner upon PT arrival and appearing drowsy in chair; pt intermittently falling asleep during session (nurse notified) requiring cueing to stay awake for activities to safely get back to bed--pt reports drowsiness d/t receiving morphine last night.  Pt reporting 9/10 L knee pain during session (nurse present end of session and notified of pt's pain).  Improved ability to stand and take steps recliner to bed with RW but pt appearing tired and requiring increased time to initiate steps.  L knee flexion AAROM to 90 degrees.  Will continue to focus on strengthening, knee ROM, and progressive functional mobility per pt tolerance.      Follow Up Recommendations  SNF     Equipment Recommendations  Rolling walker with 5" wheels;3in1 (PT);Wheelchair (measurements PT);Wheelchair cushion (measurements PT)    Recommendations for Other Services OT consult     Precautions / Restrictions Precautions Precautions: Fall;Knee Precaution Booklet Issued: Yes (comment) Restrictions Weight Bearing Restrictions: Yes LLE Weight Bearing: Weight bearing as tolerated    Mobility  Bed Mobility Overal bed mobility: Needs Assistance Bed Mobility: Sit to Supine     Sit to supine: Max assist   General bed mobility comments: assist for L LE and trunk; vc's for technique  Transfers Overall transfer level: Needs assistance Equipment used: Rolling walker (2 wheeled) Transfers: Sit to/from Stand Sit to Stand: Mod assist          General transfer comment: assist to initiate and come to full stand from recliner up to RW; vc's for UE/LE placement  Ambulation/Gait Ambulation/Gait assistance: Min assist;Mod assist Gait Distance (Feet): 3 Feet(recliner to bed) Assistive device: Rolling walker (2 wheeled)   Gait velocity: decreased   General Gait Details: L knee mildly flexed in L LE stance phase (significantly improved though compared to morning session); decreased stance time L LE; vc's for walker management and stepping pattern; increased time/effort to perform with mod to max cueing   Stairs             Wheelchair Mobility    Modified Rankin (Stroke Patients Only)       Balance Overall balance assessment: Needs assistance Sitting-balance support: Single extremity supported;Feet supported Sitting balance-Leahy Scale: Poor Sitting balance - Comments: pt requiring at least single UE support for static sitting balance (pt tending to lean backwards in sitting requiring vc's to shift weight forward to sit upright) Postural control: Posterior lean Standing balance support: Bilateral upper extremity supported Standing balance-Leahy Scale: Poor Standing balance comment: pt requiring B UE support on RW for static balance; initial posterior lean noted requiring vc's and assist to shift weight forward                            Cognition Arousal/Alertness: Awake/alert Behavior During Therapy: Anxious Overall Cognitive Status: (Oriented to person, place, and situation)                                 General  Comments: Pt reporting still feeling "out of it" d/t morphine and falling asleep intermittently during session (nurse notified).      Exercises Total Joint Exercises Long Arc Quad: AAROM;Strengthening;Left;10 reps;Seated Knee Flexion: AAROM;Strengthening;Left;10 reps;Seated Goniometric ROM: L knee flexion 90 degrees AAROM sitting in recliner General Exercises - Lower  Extremity Hip Flexion/Marching: AAROM;Strengthening;Left;10 reps;Seated    General Comments General comments (skin integrity, edema, etc.): L knee ace wrap and polar care in place.  Pt agreeable to PT session and getting back to bed (d/t falling asleep in the recliner).  Pt's husband present upon PT arrival (left beginning of session).      Pertinent Vitals/Pain Pain Assessment: 0-10 Pain Score: 9  Pain Location: L knee Pain Descriptors / Indicators: Aching;Tender;Sore;Discomfort;Constant;Burning Pain Intervention(s): Limited activity within patient's tolerance;Monitored during session;Premedicated before session;Repositioned;Patient requesting pain meds-RN notified;Other (comment)(polar care applied and activated)  Vitals (HR and O2 on 2 L O2 via nasal cannula) stable and WFL throughout treatment session.    Home Living                      Prior Function            PT Goals (current goals can now be found in the care plan section) Acute Rehab PT Goals Patient Stated Goal: to have less knee pain and improve mobility PT Goal Formulation: With patient Time For Goal Achievement: 06/21/19 Potential to Achieve Goals: Good Progress towards PT goals: Progressing toward goals    Frequency    BID      PT Plan Current plan remains appropriate    Co-evaluation              AM-PAC PT "6 Clicks" Mobility   Outcome Measure  Help needed turning from your back to your side while in a flat bed without using bedrails?: A Lot Help needed moving from lying on your back to sitting on the side of a flat bed without using bedrails?: A Lot Help needed moving to and from a bed to a chair (including a wheelchair)?: A Lot Help needed standing up from a chair using your arms (e.g., wheelchair or bedside chair)?: A Lot Help needed to walk in hospital room?: Total Help needed climbing 3-5 steps with a railing? : Total 6 Click Score: 10    End of Session Equipment Utilized  During Treatment: Gait belt;Oxygen Activity Tolerance: Patient limited by pain Patient left: in bed;with call bell/phone within reach;with bed alarm set;with nursing/sitter in room;with SCD's reapplied;Other (comment)(L heel floating via towel roll; R heel floating via pillow; polar care in place and activated) Nurse Communication: Mobility status;Precautions;Weight bearing status;Other (comment)(pt's pain; pt's O2 status; pt intermittently falling asleep during session) PT Visit Diagnosis: Other abnormalities of gait and mobility (R26.89);Muscle weakness (generalized) (M62.81);Difficulty in walking, not elsewhere classified (R26.2);Pain Pain - Right/Left: Left Pain - part of body: Knee     Time: [id number] PT Time Calculation (min) (ACUTE ONLY): 31 min  Charges:  $Therapeutic Exercise: 8-22 mins $Therapeutic Activity: 8-22 mins                     Leitha Bleak, PT 06/09/19, 2:15 PM [phone]

## 2019-06-09 NOTE — Progress Notes (Signed)
Physical Therapy Treatment Patient Details Name: Sarah Donaldson MRN: [id number] DOB: 1951/07/02 Today's Date: 06/09/2019    History of Present Illness Pt is a 68 y.o. female s/p L TKA 06/07/19 secondary unilateral primary OA L knee.  PMH includes Parkinson's disease, h/o lumbar fusion, schizophrenia, anxiety, depression, non-hodgkin's lymphoma (in remission), chronic back pain with symptom's radiating down to B LE's, and hiatal hernia.    PT Comments    Pt reporting 10/10 L knee pain beginning of session but pt agreeable to getting to recliner; L knee pain 9/10 at rest end of session in recliner (nurse notified of pt's pain).  Pt received pain meds prior to PT session.  Pt reporting being in "la la land" d/t medication received and pt demonstrated difficulty concentrating during session's activities (pt did fairly well when able to concentrate but very limited concentration time noted).  Pt requiring increased assist with bed mobility, transfers, and taking steps with RW bed to recliner (pt with L knee flexed and increased L lean in standing taking steps to chair--able to correct with cueing but then pt would start leaning to L again d/t increasing L knee flexion in L LE stance).  Nurse notified of above.  Deferred L knee flexion ROM d/t pt's reports of significant L knee pain (will attempt L knee flexion ROM this afternoon).  Pt's O2 sats 89% on room air beginning of session; O2 decreased to 87% after transfer to chair but improved to 89% with cueing for pursed lip breathing: nurse notified of pt's O2 sats and cleared PT to place pt on 2 L O2 via nasal cannula and pt's O2 sats improved to 92%.  Will continue to focus on strengthening, L knee ROM, and progressive functional mobility per pt tolerance.    Follow Up Recommendations  SNF     Equipment Recommendations  Rolling walker with 5" wheels;3in1 (PT);Wheelchair (measurements PT);Wheelchair cushion (measurements PT)    Recommendations for  Other Services OT consult     Precautions / Restrictions Precautions Precautions: Fall;Knee Precaution Booklet Issued: Yes (comment) Restrictions Weight Bearing Restrictions: Yes LLE Weight Bearing: Weight bearing as tolerated    Mobility  Bed Mobility Overal bed mobility: Needs Assistance Bed Mobility: Supine to Sit  Logrolling L x2 (min assist x1) in bed to get on/off bedpan (pt requesting to toilet quickly upon PT entering pt's room)   Supine to sit: Max assist;HOB elevated     General bed mobility comments: assist for L LE and trunk; vc's for technique; increased effort/time to perform  Transfers Overall transfer level: Needs assistance Equipment used: Rolling walker (2 wheeled) Transfers: Sit to/from Stand Sit to Stand: Mod assist;Max assist;From elevated surface         General transfer comment: x2 trials standing from bed; assist to initiate and come to full stand (bed height mildly elevated); vc's for UE/LE placement  Ambulation/Gait Ambulation/Gait assistance: Mod assist;Max assist Gait Distance (Feet): 3 Feet(bed to recliner) Assistive device: Rolling walker (2 wheeled)   Gait velocity: decreased   General Gait Details: 1st trial pt with difficulty taking any steps with RW and max cueing so pt assisted back to sitting onto edge of bed; 2nd trial pt able to occasionally take steps with RW with max cueing and other times requiring physical assist to advance L LE (increased time to perform; increased L knee flexion in stance phase with increased L lean intermittently requiring cueing to correct--pt able to correct with cueing)   Stairs  Wheelchair Mobility    Modified Rankin (Stroke Patients Only)       Balance Overall balance assessment: Needs assistance Sitting-balance support: Single extremity supported;Feet supported Sitting balance-Leahy Scale: Poor Sitting balance - Comments: pt requiring at least single UE support for static  sitting balance (pt tending to lean backwards in sitting requiring vc's to shift weight forward to sit upright) Postural control: Posterior lean Standing balance support: Bilateral upper extremity supported Standing balance-Leahy Scale: Poor Standing balance comment: pt requiring B UE support on RW for static balance; initial posterior lean noted requiring vc's and assist to shift weight forward                            Cognition Arousal/Alertness: Awake/alert Behavior During Therapy: Anxious Overall Cognitive Status: (Oriented to person, place, and situation.)                                 General Comments: Pt reporting feeling like she was in "la la land" d/t medications and demonstrated increased difficulty with concentrating during session; pt tended to be more distracted      Exercises Total Joint Exercises Ankle Circles/Pumps: AROM;Strengthening;Both;10 reps;Supine Quad Sets: AROM;Strengthening;Both;10 reps;Supine Short Arc Quad: AAROM;Strengthening;Left;10 reps;Supine Heel Slides: AAROM;Strengthening;Left;10 reps;Supine Hip ABduction/ADduction: AAROM;Strengthening;Left;10 reps;Supine Straight Leg Raises: AAROM;Strengthening;Left;10 reps;Supine Goniometric ROM: L knee extension 10 degrees short of neutral semi-supine in bed    General Comments General comments (skin integrity, edema, etc.): L knee ace wrap and polar care in place.  Nursing cleared pt for participation in physical therapy.  Pt agreeable to PT session.      Pertinent Vitals/Pain Pain Assessment: 0-10 Pain Score: 9  Pain Location: L knee Pain Descriptors / Indicators: Aching;Tender;Sore;Discomfort;Constant;Burning Pain Intervention(s): Monitored during session;Limited activity within patient's tolerance;Premedicated before session;Repositioned;Other (comment)(polar care applied and activated)  HR WFL during session's activities.    Home Living                      Prior  Function            PT Goals (current goals can now be found in the care plan section) Acute Rehab PT Goals Patient Stated Goal: to have less knee pain and improve mobility PT Goal Formulation: With patient Time For Goal Achievement: 06/21/19 Potential to Achieve Goals: Good Progress towards PT goals: Progressing toward goals    Frequency    BID      PT Plan Current plan remains appropriate    Co-evaluation              AM-PAC PT "6 Clicks" Mobility   Outcome Measure  Help needed turning from your back to your side while in a flat bed without using bedrails?: A Lot Help needed moving from lying on your back to sitting on the side of a flat bed without using bedrails?: A Lot Help needed moving to and from a bed to a chair (including a wheelchair)?: A Lot Help needed standing up from a chair using your arms (e.g., wheelchair or bedside chair)?: A Lot Help needed to walk in hospital room?: Total Help needed climbing 3-5 steps with a railing? : Total 6 Click Score: 10    End of Session Equipment Utilized During Treatment: Gait belt;Oxygen Activity Tolerance: Patient limited by pain Patient left: in chair;with call bell/phone within reach;with chair alarm set;with SCD's reapplied;Other (comment)(B heels  floating via towel rolls; polar care in place and activated) Nurse Communication: Mobility status;Precautions;Weight bearing status;Other (comment)(Pt's pain status; pt's O2 status) PT Visit Diagnosis: Other abnormalities of gait and mobility (R26.89);Muscle weakness (generalized) (M62.81);Difficulty in walking, not elsewhere classified (R26.2);Pain Pain - Right/Left: Left Pain - part of body: Knee     Time: 0910-0950 PT Time Calculation (min) (ACUTE ONLY): 40 min  Charges:  $Gait Training: 8-22 mins $Therapeutic Exercise: 8-22 mins $Therapeutic Activity: 8-22 mins                     Leitha Bleak, PT 06/09/19, 11:30 AM [phone]

## 2019-06-10 DIAGNOSIS — R2681 Unsteadiness on feet: Secondary | ICD-10-CM | POA: Diagnosis not present

## 2019-06-10 DIAGNOSIS — F324 Major depressive disorder, single episode, in partial remission: Secondary | ICD-10-CM | POA: Diagnosis not present

## 2019-06-10 DIAGNOSIS — E7849 Other hyperlipidemia: Secondary | ICD-10-CM | POA: Diagnosis not present

## 2019-06-10 DIAGNOSIS — K5909 Other constipation: Secondary | ICD-10-CM | POA: Diagnosis not present

## 2019-06-10 DIAGNOSIS — R41841 Cognitive communication deficit: Secondary | ICD-10-CM | POA: Diagnosis not present

## 2019-06-10 DIAGNOSIS — M62838 Other muscle spasm: Secondary | ICD-10-CM | POA: Diagnosis not present

## 2019-06-10 DIAGNOSIS — Z96652 Presence of left artificial knee joint: Secondary | ICD-10-CM | POA: Diagnosis not present

## 2019-06-10 DIAGNOSIS — Z8572 Personal history of non-Hodgkin lymphomas: Secondary | ICD-10-CM | POA: Diagnosis not present

## 2019-06-10 DIAGNOSIS — R0902 Hypoxemia: Secondary | ICD-10-CM | POA: Diagnosis not present

## 2019-06-10 DIAGNOSIS — K219 Gastro-esophageal reflux disease without esophagitis: Secondary | ICD-10-CM | POA: Diagnosis not present

## 2019-06-10 DIAGNOSIS — Z03818 Encounter for observation for suspected exposure to other biological agents ruled out: Secondary | ICD-10-CM | POA: Diagnosis not present

## 2019-06-10 DIAGNOSIS — Z471 Aftercare following joint replacement surgery: Secondary | ICD-10-CM | POA: Diagnosis not present

## 2019-06-10 DIAGNOSIS — R404 Transient alteration of awareness: Secondary | ICD-10-CM | POA: Diagnosis not present

## 2019-06-10 DIAGNOSIS — M2550 Pain in unspecified joint: Secondary | ICD-10-CM | POA: Diagnosis not present

## 2019-06-10 DIAGNOSIS — M6281 Muscle weakness (generalized): Secondary | ICD-10-CM | POA: Diagnosis not present

## 2019-06-10 DIAGNOSIS — Z7401 Bed confinement status: Secondary | ICD-10-CM | POA: Diagnosis not present

## 2019-06-10 DIAGNOSIS — G20 Parkinson's disease: Secondary | ICD-10-CM | POA: Diagnosis not present

## 2019-06-10 MED ORDER — FLEET ENEMA 7-19 GM/118ML RE ENEM
1.0000 | ENEMA | Freq: Once | RECTAL | Status: AC
  Administered 2019-06-10: 1 via RECTAL

## 2019-06-10 NOTE — Progress Notes (Signed)
Physical Therapy Treatment Patient Details Name: Sarah Donaldson MRN: [id number] DOB: 1950-08-30 Today's Date: 06/10/2019    History of Present Illness Pt is a 68 y.o. female s/p L TKA 06/07/19 secondary unilateral primary OA L knee.  PMH includes Parkinson's disease, h/o lumbar fusion, schizophrenia, anxiety, depression, non-hodgkin's lymphoma (in remission), chronic back pain with symptom's radiating down to B LE's, and hiatal hernia.    PT Comments    Pt was seen for mobility but did not agree to walk due to fatigue.  Pt was able to do there ex with help but does not follow along well and requires both verbal and tactile cues for completion of any task.  Her plan is still to go to SNF, and is expected to leave today.  Follow as planned if dc is not completed today.   Follow Up Recommendations  SNF     Equipment Recommendations  Rolling walker with 5" wheels;3in1 (PT);Wheelchair (measurements PT);Wheelchair cushion (measurements PT)    Recommendations for Other Services OT consult     Precautions / Restrictions Precautions Precautions: Fall;Knee Precaution Booklet Issued: Yes (comment) Restrictions Weight Bearing Restrictions: Yes LLE Weight Bearing: Weight bearing as tolerated    Mobility  Bed Mobility Overal bed mobility: Needs Assistance             General bed mobility comments: pt was not willing to get up to side of bed  Transfers                 General transfer comment: declined  Ambulation/Gait                 Stairs             Wheelchair Mobility    Modified Rankin (Stroke Patients Only)       Balance                                            Cognition Arousal/Alertness: Lethargic Behavior During Therapy: Flat affect Overall Cognitive Status: Impaired/Different from baseline Area of Impairment: Problem solving;Awareness;Safety/judgement;Following commands                       Following  Commands: Follows one step commands inconsistently;Follows one step commands with increased time Safety/Judgement: Decreased awareness of safety;Decreased awareness of deficits Awareness: Intellectual Problem Solving: Slow processing General Comments: pt is asking why she cannot move the leg well, repetitive question      Exercises Total Joint Exercises Ankle Circles/Pumps: AAROM;10 reps Quad Sets: AROM;10 reps Gluteal Sets: AROM;10 reps Heel Slides: AROM;AAROM;10 reps Hip ABduction/ADduction: AROM;AAROM;10 reps Straight Leg Raises: AAROM;Both;10 reps    General Comments General comments (skin integrity, edema, etc.): pt had ice replaced in [facility] to reduce painbut reports she cannot feel it      Pertinent Vitals/Pain Pain Assessment: Faces Faces Pain Scale: Hurts even more Pain Location: L knee Pain Descriptors / Indicators: Operative site guarding;Grimacing Pain Intervention(s): Limited activity within patient's tolerance;Monitored during session;Premedicated before session;Repositioned;Ice applied    Home Living                      Prior Function            PT Goals (current goals can now be found in the care plan section) Acute Rehab PT Goals Patient Stated Goal: to feel better  on L knee, reduce pain Progress towards PT goals: Not progressing toward goals - comment    Frequency    BID      PT Plan Current plan remains appropriate    Co-evaluation              AM-PAC PT "6 Clicks" Mobility   Outcome Measure  Help needed turning from your back to your side while in a flat bed without using bedrails?: A Lot Help needed moving from lying on your back to sitting on the side of a flat bed without using bedrails?: A Lot Help needed moving to and from a bed to a chair (including a wheelchair)?: A Lot Help needed standing up from a chair using your arms (e.g., wheelchair or bedside chair)?: A Lot Help needed to walk in hospital room?:  Total Help needed climbing 3-5 steps with a railing? : Total 6 Click Score: 10    End of Session Equipment Utilized During Treatment: (pt took O2 off) Activity Tolerance: Patient limited by fatigue;Patient limited by pain Patient left: in bed;with call bell/phone within reach;with bed alarm set Nurse Communication: Mobility status PT Visit Diagnosis: Other abnormalities of gait and mobility (R26.89);Muscle weakness (generalized) (M62.81);Difficulty in walking, not elsewhere classified (R26.2);Pain Pain - Right/Left: Left Pain - part of body: Knee     Time: [id number] PT Time Calculation (min) (ACUTE ONLY): 19 min  Charges:  $Therapeutic Exercise: 8-22 mins                    Ramond Dial 06/10/2019, 1:12 PM   Mee Hives, PT MS Acute Rehab Dept. Number: [place] [facility] [place]

## 2019-06-10 NOTE — TOC Transition Note (Signed)
Transition of Care [facility]) - CM/SW Discharge Note   Patient Details  Name: ALYSAN HERRELL MRN: [id number] Date of Birth: [birth year]  Transition of Care [facility]) CM/SW Contact:  Weston Anna, LCSW Phone Number: 06/10/2019, 1:09 PM   Clinical Narrative:     Patient set to discharge to [facility] SNF today- please call report to [phone]. CSW notified patients spouse, Jenny Reichmann, no concerns. Patient will need transportation via [facility]   Final next level of care: [place] Barriers to Discharge: No Barriers Identified   Patient Goals and CMS Choice Patient states their goals for this hospitalization and ongoing recovery are:: Pain control. CMS Medicare.gov Compare Post Acute Care list provided to:: Patient Choice offered to / list presented to : Patient  Discharge Placement              Patient chooses bed at: [facility] Patient to be transferred to facility by: [facility] Name of family member notified: spouse- [name] Patient and family notified of of transfer: 06/10/19  Discharge Plan and Services In-house Referral: Clinical Social Work   Post Acute Care Choice: [place]          DME Arranged: N/A         HH Arranged: NA          Social Determinants of Health (SDOH) Interventions     Readmission Risk Interventions No flowsheet data found.

## 2019-06-10 NOTE — Discharge Summary (Signed)
Physician Discharge Summary  Patient ID: Sarah Donaldson MRN: [id number] DOB/AGE: October 18, 1950 68 y.o.  Admit date: 06/07/2019 Discharge date: 06/10/2019  Admission Diagnoses:  M17.12 unilateral primary osteoarthritis left knee <principal problem not specified>  Discharge Diagnoses:  M17.12 unilateral primary osteoarthritis left knee Active Problems:   S/P total knee replacement, left   Past Medical History:  Diagnosis Date  . Cancer ([place])    Non-hodgkin's lymphoma (in remission)  . GERD (gastroesophageal reflux disease)    well-controlled w/ omeprazole  . Hyperlipidemia   . Knee joint cyst, left 02/07/2018  . Parkinson disease ([place])   . Parkinson disease ([place])     Surgeries: Procedure(s): TOTAL KNEE ARTHROPLASTY on 06/07/2019   Consultants (if any):   Discharged Condition: Improved  Hospital Course: Sarah Donaldson is an 68 y.o. female who was admitted 06/07/2019 with a diagnosis of  M17.12 unilateral primary osteoarthritis left knee <principal problem not specified> and went to the operating room on 06/07/2019 and underwent the above named procedures.    She was given perioperative antibiotics:  Anti-infectives (From admission, onward)   Start     Dose/Rate Route Frequency Ordered Stop   06/07/19 1500  ceFAZolin (ANCEF) IVPB 2g/100 mL premix     2 g 200 mL/hr over 30 Minutes Intravenous Every 6 hours 06/07/19 1218 06/07/19 2121   06/07/19 0900  50,000 units bacitracin in 0.9% normal saline 250 mL irrigation  Status:  Discontinued       As needed 06/07/19 0936 06/07/19 1022   06/07/19 0633  ceFAZolin (ANCEF) 2-4 GM/100ML-% IVPB    Note to Pharmacy: Milinda Cave   : cabinet override      06/07/19 0633 06/07/19 0841   06/07/19 0615  ceFAZolin (ANCEF) IVPB 2g/100 mL premix     2 g 200 mL/hr over 30 Minutes Intravenous On call to O.R. 06/07/19 [id number] 06/07/19 0848    .  She was given sequential compression devices, early ambulation, and chemical DVT  prophylaxis.  She benefited maximally from the hospital stay and there were no complications.    Recent vital signs:  Vitals:   06/09/19 2320 06/10/19 0742  BP: 127/72 129/75  Pulse: 97 94  Resp: 18 16  Temp: 98.5 F (36.9 C) 99.1 F (37.3 C)  SpO2: 94% 98%    Recent laboratory studies:  Lab Results  Component Value Date   HGB 11.0 (L) 06/09/2019   HGB 11.8 (L) 06/08/2019   HGB 13.2 05/31/2019   Lab Results  Component Value Date   WBC 8.2 06/09/2019   PLT 166 06/09/2019   Lab Results  Component Value Date   INR 0.9 05/31/2019   Lab Results  Component Value Date   NA 142 06/08/2019   K 3.7 06/08/2019   CL 106 06/08/2019   CO2 28 06/08/2019   BUN 23 06/08/2019   CREATININE 0.46 06/08/2019   GLUCOSE 102 (H) 06/08/2019    Discharge Medications:   Allergies as of 06/10/2019      Reactions   Baclofen    Caused numbness in mouth    Codeine Sulfate Nausea And Vomiting   Gabapentin Swelling   Mouth swelling    Terazosin       Medication List    TAKE these medications   acetaminophen 650 MG CR tablet Commonly known as: TYLENOL Take 1,300 mg by mouth every 8 (eight) hours as needed for pain.   aspirin 81 MG chewable tablet Chew 1 tablet (81 mg total) by mouth 2 (two)  times daily.   carbidopa-levodopa 25-100 MG tablet Commonly known as: SINEMET IR Take 2 tablets by mouth 6 (six) times daily. Take 2 tablets q2 hours between 0800 and 2000.  Pt can take an extra 1-2 tabs at night as needed for tightness   Clinpro 5000 1.1 % Pste Generic drug: Sodium Fluoride Place 1 application onto teeth at bedtime.   clonazePAM 1 MG tablet Commonly known as: KLONOPIN Take 1 mg by mouth 4 (four) times daily.   diclofenac 75 MG EC tablet Commonly known as: VOLTAREN Take 75 mg by mouth 2 (two) times daily.   docusate sodium 100 MG capsule Commonly known as: COLACE Take 1 capsule (100 mg total) by mouth 2 (two) times daily.   furosemide 20 MG tablet Commonly known  as: LASIX Take 20 mg by mouth daily as needed for fluid.   HYDROcodone-acetaminophen 5-325 MG tablet Commonly known as: NORCO/VICODIN Take 1-2 tablets by mouth every 4 (four) hours as needed (pain score 4-6).   methocarbamol 500 MG tablet Commonly known as: ROBAXIN Take 1 tablet (500 mg total) by mouth every 6 (six) hours as needed for muscle spasms.   metoCLOPramide 5 MG tablet Commonly known as: REGLAN Take 1 tablet (5 mg total) by mouth every 8 (eight) hours as needed for nausea (if ondansetron (ZOFRAN) ineffective.).   neomycin-polymyxin b-dexamethasone 3.5-10000-0.1 Oint Commonly known as: MAXITROL Place 1 application into the left eye at bedtime.   omeprazole 20 MG capsule Commonly known as: PRILOSEC Take 1 capsule (20 mg total) by mouth daily.   prednisoLONE acetate 1 % ophthalmic suspension Commonly known as: PRED FORTE Place 1 drop into the left eye daily.   pregabalin 50 MG capsule Commonly known as: Lyrica BID to TID prn What changed:   how much to take  when to take this  additional instructions   QUEtiapine 25 MG tablet Commonly known as: SEROQUEL Take 25 mg by mouth at bedtime.   QUEtiapine 50 MG tablet Commonly known as: SEROquel Take 1 tablet (50 mg total) by mouth at bedtime.   traMADol 50 MG tablet Commonly known as: ULTRAM Take 1 tablet (50 mg total) by mouth every 6 (six) hours.   traZODone 50 MG tablet Commonly known as: DESYREL Take 75 mg by mouth at bedtime.       Diagnostic Studies: Dg Knee Left Port  Result Date: 06/07/2019 CLINICAL DATA:  Status post left knee arthroplasty. EXAM: PORTABLE LEFT KNEE - 1-2 VIEW COMPARISON:  None. FINDINGS: Two views demonstrate normal alignment status post total left knee arthroplasty. There is some air and fluid in the joint space. IMPRESSION: Normal alignment of total left knee arthroplasty. Electronically Signed   By: Aletta Edouard M.D.   On: 06/07/2019 11:23   [place] Or Nerve Block-image Only  ([facility])  Result Date: 06/07/2019 There is no interpretation for this exam.  This order is for images obtained during a surgical procedure.  Please See "Surgeries" Tab for more information regarding the procedure.    Disposition: Discharge disposition: 03-Skilled Nursing Facility         Contact information for after-discharge care    Destination    [facility] .   Service: Skilled Nursing Contact information: [address] [place] [phone]               Signed: Lovell Sheehan ,MD 06/10/2019, 12:08 PM

## 2019-06-10 NOTE — Progress Notes (Signed)
Report called [name], Sarah Donaldson Resides.  Patient being prepped for EMS transport.  No s/s of distress noted.

## 2019-06-10 NOTE — Progress Notes (Signed)
  Subjective:  POD #3 s/p left TKA.   Patient reports left knee pain as mild to moderate.  Patient due to have a BM.  Suppository given.  No other complaints.  Objective:   VITALS:   Vitals:   06/09/19 0806 06/09/19 1615 06/09/19 2320 06/10/19 0742  BP: 123/74 114/70 127/72 129/75  Pulse: 96 85 97 94  Resp: 18 16 18 16   Temp: 98.3 F (36.8 C) 98.4 F (36.9 C) 98.5 F (36.9 C) 99.1 F (37.3 C)  TempSrc: Oral Oral  Oral  SpO2: 95% 98% 94% 98%  Weight:      Height:        PHYSICAL EXAM: Left lower extremity Neurovascular intact Sensation intact distally Intact pulses distally Dorsiflexion/Plantar flexion intact Incision: dressing C/D/I No cellulitis present Compartment soft  LABS  No results found for this or any previous visit (from the past 24 hour(s)).  No results found.  Assessment/Plan: 3 Days Post-Op   Active Problems:   S/P total knee replacement, left  Patient progressing post-op.  Will be discharged to SNF today.  Patient on aspirin 81 mg PO BID for DVT prophylaxis per Dr. Harlow Mares' discharge protocol.  Follow up with Dr. Harlow Mares in 1-2 weeks.    Thornton Park , MD 06/10/2019, 3:08 PM

## 2019-06-10 NOTE — Plan of Care (Signed)
Patient with slight confusion noted with situation.  Easily reoriented.  Patient to be discharged to [facility].  Husband at the bedside.  Will continue to monitor.

## 2019-06-10 NOTE — Progress Notes (Signed)
EMS here to transport patient.  Husband at bedside. No s/s of distess noted.

## 2019-06-13 DIAGNOSIS — K219 Gastro-esophageal reflux disease without esophagitis: Secondary | ICD-10-CM | POA: Diagnosis not present

## 2019-06-13 DIAGNOSIS — R41841 Cognitive communication deficit: Secondary | ICD-10-CM | POA: Diagnosis not present

## 2019-06-13 DIAGNOSIS — K5909 Other constipation: Secondary | ICD-10-CM | POA: Diagnosis not present

## 2019-06-13 DIAGNOSIS — M62838 Other muscle spasm: Secondary | ICD-10-CM | POA: Diagnosis not present

## 2019-06-13 DIAGNOSIS — Z96652 Presence of left artificial knee joint: Secondary | ICD-10-CM | POA: Diagnosis not present

## 2019-06-13 DIAGNOSIS — F324 Major depressive disorder, single episode, in partial remission: Secondary | ICD-10-CM | POA: Diagnosis not present

## 2019-06-13 DIAGNOSIS — G20 Parkinson's disease: Secondary | ICD-10-CM | POA: Diagnosis not present

## 2019-06-13 DIAGNOSIS — E7849 Other hyperlipidemia: Secondary | ICD-10-CM | POA: Diagnosis not present

## 2019-06-13 DIAGNOSIS — M6281 Muscle weakness (generalized): Secondary | ICD-10-CM | POA: Diagnosis not present

## 2019-06-13 DIAGNOSIS — Z8572 Personal history of non-Hodgkin lymphomas: Secondary | ICD-10-CM | POA: Diagnosis not present

## 2019-06-15 ENCOUNTER — Ambulatory Visit: Payer: Medicare Other | Admitting: Neurology

## 2019-06-20 DIAGNOSIS — F324 Major depressive disorder, single episode, in partial remission: Secondary | ICD-10-CM | POA: Diagnosis not present

## 2019-06-20 DIAGNOSIS — K219 Gastro-esophageal reflux disease without esophagitis: Secondary | ICD-10-CM | POA: Diagnosis not present

## 2019-06-20 DIAGNOSIS — K5909 Other constipation: Secondary | ICD-10-CM | POA: Diagnosis not present

## 2019-06-20 DIAGNOSIS — G20 Parkinson's disease: Secondary | ICD-10-CM | POA: Diagnosis not present

## 2019-06-20 DIAGNOSIS — Z96652 Presence of left artificial knee joint: Secondary | ICD-10-CM | POA: Diagnosis not present

## 2019-06-23 DIAGNOSIS — Z96652 Presence of left artificial knee joint: Secondary | ICD-10-CM | POA: Diagnosis not present

## 2019-06-23 DIAGNOSIS — Z79891 Long term (current) use of opiate analgesic: Secondary | ICD-10-CM | POA: Diagnosis not present

## 2019-06-23 DIAGNOSIS — M62838 Other muscle spasm: Secondary | ICD-10-CM | POA: Diagnosis not present

## 2019-06-23 DIAGNOSIS — K5900 Constipation, unspecified: Secondary | ICD-10-CM | POA: Diagnosis not present

## 2019-06-23 DIAGNOSIS — F0281 Dementia in other diseases classified elsewhere with behavioral disturbance: Secondary | ICD-10-CM | POA: Diagnosis not present

## 2019-06-23 DIAGNOSIS — I10 Essential (primary) hypertension: Secondary | ICD-10-CM | POA: Diagnosis not present

## 2019-06-23 DIAGNOSIS — Z791 Long term (current) use of non-steroidal anti-inflammatories (NSAID): Secondary | ICD-10-CM | POA: Diagnosis not present

## 2019-06-23 DIAGNOSIS — Z471 Aftercare following joint replacement surgery: Secondary | ICD-10-CM | POA: Diagnosis not present

## 2019-06-23 DIAGNOSIS — K219 Gastro-esophageal reflux disease without esophagitis: Secondary | ICD-10-CM | POA: Diagnosis not present

## 2019-06-23 DIAGNOSIS — F324 Major depressive disorder, single episode, in partial remission: Secondary | ICD-10-CM | POA: Diagnosis not present

## 2019-06-23 DIAGNOSIS — Z9181 History of falling: Secondary | ICD-10-CM | POA: Diagnosis not present

## 2019-06-23 DIAGNOSIS — E785 Hyperlipidemia, unspecified: Secondary | ICD-10-CM | POA: Diagnosis not present

## 2019-06-23 DIAGNOSIS — Z87891 Personal history of nicotine dependence: Secondary | ICD-10-CM | POA: Diagnosis not present

## 2019-06-23 DIAGNOSIS — M1990 Unspecified osteoarthritis, unspecified site: Secondary | ICD-10-CM | POA: Diagnosis not present

## 2019-06-23 DIAGNOSIS — G20 Parkinson's disease: Secondary | ICD-10-CM | POA: Diagnosis not present

## 2019-06-23 DIAGNOSIS — H547 Unspecified visual loss: Secondary | ICD-10-CM | POA: Diagnosis not present

## 2019-06-28 DIAGNOSIS — M1712 Unilateral primary osteoarthritis, left knee: Secondary | ICD-10-CM | POA: Diagnosis not present

## 2019-06-28 DIAGNOSIS — R202 Paresthesia of skin: Secondary | ICD-10-CM | POA: Diagnosis not present

## 2019-06-28 DIAGNOSIS — G20 Parkinson's disease: Secondary | ICD-10-CM | POA: Diagnosis not present

## 2019-06-28 DIAGNOSIS — R200 Anesthesia of skin: Secondary | ICD-10-CM | POA: Diagnosis not present

## 2019-06-28 DIAGNOSIS — Z9889 Other specified postprocedural states: Secondary | ICD-10-CM | POA: Diagnosis not present

## 2019-06-28 DIAGNOSIS — G4752 REM sleep behavior disorder: Secondary | ICD-10-CM | POA: Diagnosis not present
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
## 2019-07-03 ENCOUNTER — Other Ambulatory Visit: Payer: Self-pay

## 2019-07-03 ENCOUNTER — Ambulatory Visit (INDEPENDENT_AMBULATORY_CARE_PROVIDER_SITE_OTHER): Payer: Medicare Other | Admitting: Neurology

## 2019-07-03 ENCOUNTER — Encounter: Payer: Self-pay | Admitting: Neurology

## 2019-07-03 VITALS — BP 112/72 | HR 91 | Resp 18

## 2019-07-03 DIAGNOSIS — F411 Generalized anxiety disorder: Secondary | ICD-10-CM

## 2019-07-03 DIAGNOSIS — G20 Parkinson's disease: Secondary | ICD-10-CM

## 2019-07-03 NOTE — Patient Instructions (Signed)
Make follow up with Dr Melrose Nakayama.

## 2019-07-18 DIAGNOSIS — Z96652 Presence of left artificial knee joint: Secondary | ICD-10-CM | POA: Diagnosis not present

## 2019-07-27 ENCOUNTER — Other Ambulatory Visit: Payer: Self-pay

## 2019-07-27 ENCOUNTER — Ambulatory Visit
Admission: RE | Admit: 2019-07-27 | Discharge: 2019-07-27 | Disposition: A | Discharge status: Home / Self Care | Payer: Medicare Other | Source: Ambulatory Visit | Attending: Unknown Physician Specialty | Admitting: Unknown Physician Specialty

## 2019-07-27 DIAGNOSIS — E041 Nontoxic single thyroid nodule: Secondary | ICD-10-CM | POA: Diagnosis present

## 2019-07-27 DIAGNOSIS — E042 Nontoxic multinodular goiter: Secondary | ICD-10-CM | POA: Diagnosis not present

## 2019-08-16 ENCOUNTER — Telehealth: Payer: No Typology Code available for payment source | Admitting: Neurology

## 2019-08-17 DIAGNOSIS — Z96652 Presence of left artificial knee joint: Secondary | ICD-10-CM | POA: Diagnosis not present

## 2019-08-23 DIAGNOSIS — Z96652 Presence of left artificial knee joint: Secondary | ICD-10-CM | POA: Diagnosis not present

## 2019-08-28 DIAGNOSIS — G4752 REM sleep behavior disorder: Secondary | ICD-10-CM | POA: Diagnosis not present

## 2019-08-28 DIAGNOSIS — Z96652 Presence of left artificial knee joint: Secondary | ICD-10-CM | POA: Diagnosis not present

## 2019-08-28 DIAGNOSIS — R200 Anesthesia of skin: Secondary | ICD-10-CM | POA: Diagnosis not present

## 2019-08-28 DIAGNOSIS — G20 Parkinson's disease: Secondary | ICD-10-CM | POA: Diagnosis not present

## 2019-08-28 DIAGNOSIS — R202 Paresthesia of skin: Secondary | ICD-10-CM | POA: Diagnosis not present

## 2019-08-31 DIAGNOSIS — Z96652 Presence of left artificial knee joint: Secondary | ICD-10-CM | POA: Diagnosis not present

## 2019-09-04 DIAGNOSIS — M25469 Effusion, unspecified knee: Secondary | ICD-10-CM | POA: Diagnosis not present

## 2019-09-04 DIAGNOSIS — Z96652 Presence of left artificial knee joint: Secondary | ICD-10-CM | POA: Diagnosis not present

## 2019-09-04 DIAGNOSIS — M25462 Effusion, left knee: Secondary | ICD-10-CM | POA: Diagnosis not present

## 2019-09-11 DIAGNOSIS — Z96652 Presence of left artificial knee joint: Secondary | ICD-10-CM | POA: Diagnosis not present

## 2019-09-11 DIAGNOSIS — M25462 Effusion, left knee: Secondary | ICD-10-CM | POA: Diagnosis not present

## 2019-09-14 DIAGNOSIS — Z96652 Presence of left artificial knee joint: Secondary | ICD-10-CM | POA: Diagnosis not present

## 2019-09-14 DIAGNOSIS — M25462 Effusion, left knee: Secondary | ICD-10-CM | POA: Diagnosis not present

## 2019-09-19 DIAGNOSIS — Z96652 Presence of left artificial knee joint: Secondary | ICD-10-CM | POA: Diagnosis not present

## 2019-09-24 ENCOUNTER — Other Ambulatory Visit: Payer: Self-pay

## 2019-09-24 ENCOUNTER — Emergency Department
Admission: EM | Admit: 2019-09-24 | Discharge: 2019-09-25 | Disposition: A | Discharge status: Home / Self Care | Payer: Medicare Other | Attending: Emergency Medicine | Admitting: Emergency Medicine

## 2019-09-24 ENCOUNTER — Emergency Department: Payer: Medicare Other

## 2019-09-24 DIAGNOSIS — Z79899 Other long term (current) drug therapy: Secondary | ICD-10-CM | POA: Insufficient documentation

## 2019-09-24 DIAGNOSIS — R1031 Right lower quadrant pain: Secondary | ICD-10-CM | POA: Diagnosis not present

## 2019-09-24 DIAGNOSIS — R0600 Dyspnea, unspecified: Secondary | ICD-10-CM | POA: Diagnosis not present

## 2019-09-24 DIAGNOSIS — R000 Tachycardia, unspecified: Secondary | ICD-10-CM | POA: Diagnosis not present

## 2019-09-24 DIAGNOSIS — G20 Parkinson's disease: Secondary | ICD-10-CM

## 2019-09-24 DIAGNOSIS — R509 Fever, unspecified: Secondary | ICD-10-CM | POA: Diagnosis not present

## 2019-09-24 DIAGNOSIS — F419 Anxiety disorder, unspecified: Secondary | ICD-10-CM | POA: Diagnosis not present

## 2019-09-24 DIAGNOSIS — R069 Unspecified abnormalities of breathing: Secondary | ICD-10-CM | POA: Diagnosis not present

## 2019-09-24 DIAGNOSIS — R0602 Shortness of breath: Secondary | ICD-10-CM | POA: Diagnosis not present

## 2019-09-24 DIAGNOSIS — R251 Tremor, unspecified: Secondary | ICD-10-CM | POA: Diagnosis not present

## 2019-09-24 DIAGNOSIS — I10 Essential (primary) hypertension: Secondary | ICD-10-CM | POA: Diagnosis not present

## 2019-09-24 DIAGNOSIS — R0902 Hypoxemia: Secondary | ICD-10-CM | POA: Diagnosis not present

## 2019-09-24 DIAGNOSIS — R61 Generalized hyperhidrosis: Secondary | ICD-10-CM | POA: Diagnosis not present

## 2019-09-24 MED ORDER — LORAZEPAM 2 MG/ML IJ SOLN
1.0000 mg | Freq: Once | INTRAMUSCULAR | Status: AC
  Administered 2019-09-25: 1 mg via INTRAVENOUS
  Filled 2019-09-24: qty 1

## 2019-09-24 MED ORDER — LACTATED RINGERS IV BOLUS
1000.0000 mL | Freq: Once | INTRAVENOUS | Status: AC
  Administered 2019-09-25: 1000 mL via INTRAVENOUS

## 2019-09-24 NOTE — ED Triage Notes (Signed)
PT to ed via ems c/o increased tremors x2-3 hours. PT hx of parkinsons but shaking is now worse. PT takes parkinsons meds, has not missed any meds. States this has happened before, dx as anxiety. PT is diaphoretic.

## 2019-09-24 NOTE — ED Provider Notes (Signed)
[facility] Emergency Department Provider Note   ____________________________________________   First MD Initiated Contact with Patient 09/24/19 2327     (approximate)  I have reviewed the triage vital signs and the nursing notes.   HISTORY  Chief Complaint Tremors    HPI Sarah Donaldson is a 69 y.o. adult with past medical history of Parkinson disease, anxiety, chronic pain presents to the ED for tremor.  Patient reports that she has had increasing tremor over the past few hours at home despite taking her usual Parkinson medications.  She complains of pain to the right lower portion of her abdomen and extending down her right leg, but she denies any nausea, vomiting, diarrhea, or dysuria.  She has not noticed any fevers at home, but does endorse some difficulty breathing.  She denies any cough or chest pain, is not aware of any sick contacts.  She takes her carbidopa levodopa every 2 hours, is not sure when she took her last dose but does not believe she missed any doses.  She also takes Klonopin regularly for anxiety, is unsure when she last took this.  She denies any alcohol or drug abuse.        Past Medical History:  Diagnosis Date  . Cancer ([place])    Non-hodgkin's lymphoma (in remission)  . GERD (gastroesophageal reflux disease)    well-controlled w/ omeprazole  . Hyperlipidemia   . Knee joint cyst, left 02/07/2018  . Parkinson disease ([place])   . Parkinson disease [facility])     Patient Active Problem List   Diagnosis Date Noted  . S/P total knee replacement, left 06/07/2019  . Osteoarthritis of knee 04/27/2019  . Panic attacks 04/07/2019  . Psychosis ([place]) 04/04/2019  . Numbness 03/09/2018  . Lumbar spondylosis 03/07/2018  . Chronic pain of left knee 03/07/2018  . Chronic upper back pain 12/08/2017  . Benign neoplasm of hand 10/20/2017  . Chronic pain syndrome 10/20/2017  . Chronic pain of right lower extremity 10/20/2017  . Chronic  myofascial pain 10/20/2017  . Numbness and tingling 07/12/2017  . Sleep difficulties 07/12/2017  . Low back pain 01/18/2017  . Sleep behavior disorder, REM 01/18/2017  . Lumbar radiculopathy 11/01/2016  . Follicular lymphoma of extranodal and solid organ sites [facility]) 05/01/2016  . Lymphoma ([place]) 04/30/2016  . Hallux limitus 07/27/2015  . Low back pain with sciatica 02/13/2015  . Bilateral low back pain with sciatica 02/13/2015  . H/O adenomatous polyp of colon 04/06/2014  . Hx of adenomatous colonic polyps 04/06/2014  . Back ache 01/25/2014  . Adiposity 01/25/2014  . REM sleep behavior disorder 01/25/2014  . Disordered sleep 01/25/2014  . Has a tremor 01/25/2014  . Obesity, unspecified 01/25/2014  . Sleep disorder 01/25/2014  . Backache 01/25/2014  . Tremor 01/25/2014  . Obesity 01/25/2014  . Difficulty hearing 06/08/2012  . Hearing loss 06/08/2012  . Anxiety disorder 06/06/2012  . Colon polyp 06/06/2012  . Clinical depression 06/06/2012  . Hypercholesteremia 06/06/2012  . Idiopathic Parkinson's disease ([place]) 06/06/2012  . Detached retina 06/06/2012  . Depressive disorder 06/06/2012  . Retinal detachment 06/06/2012  . Parkinson's disease ([place]) 06/06/2012    Past Surgical History:  Procedure Laterality Date  . BACK SURGERY    . BUNIONECTOMY    . NASAL SEPTUM SURGERY    . RETINAL DETACHMENT SURGERY    . TOTAL KNEE ARTHROPLASTY Left 06/07/2019   Procedure: TOTAL KNEE ARTHROPLASTY;  Surgeon: Lovell Sheehan, MD;  Location: [facility] ORS;  Service: Orthopedics;  Laterality: Left;    Prior to Admission medications   Medication Sig Start Date End Date Taking? Authorizing Provider  acetaminophen (TYLENOL) 650 MG CR tablet Take 1,300 mg by mouth every 8 (eight) hours as needed for pain.     [provider]  aspirin 81 MG chewable tablet Chew 1 tablet (81 mg total) by mouth 2 (two) times daily. 06/09/19   Carlynn Spry, PA-C  carbidopa-levodopa (SINEMET IR) 25-100 MG  tablet Take 2 tablets by mouth 6 (six) times daily. Take 2 tablets q2 hours between 0800 and 2000.  Pt can take an extra 1-2 tabs at night as needed for tightness 11/08/18   [provider]  clonazePAM (KLONOPIN) 1 MG tablet Take 1 mg by mouth 4 (four) times daily.     [provider]  furosemide (LASIX) 20 MG tablet Take 20 mg by mouth daily as needed for fluid.  11/06/18   [provider]  HYDROcodone-acetaminophen (NORCO/VICODIN) 5-325 MG tablet Take 1-2 tablets by mouth every 4 (four) hours as needed (pain score 4-6). Patient not taking: Reported on 07/03/2019 06/09/19   Carlynn Spry, PA-C  methocarbamol (ROBAXIN) 500 MG tablet Take 1 tablet (500 mg total) by mouth every 6 (six) hours as needed for muscle spasms. Patient not taking: Reported on 07/03/2019 06/09/19   Carlynn Spry, PA-C  metoCLOPramide (REGLAN) 5 MG tablet Take 1 tablet (5 mg total) by mouth every 8 (eight) hours as needed for nausea (if ondansetron (ZOFRAN) ineffective.). Patient not taking: Reported on 07/03/2019 06/09/19   Carlynn Spry, PA-C  neomycin-polymyxin b-dexamethasone (MAXITROL) 3.5-10000-0.1 OINT Place 1 application into the left eye at bedtime.     [provider]  omeprazole (PRILOSEC) 20 MG capsule Take 1 capsule (20 mg total) by mouth daily. 03/07/18 11/22/24  Vevelyn Francois, NP  prednisoLONE acetate (PRED FORTE) 1 % ophthalmic suspension Place 1 drop into the left eye daily. 10/07/17   [provider]  pregabalin (LYRICA) 50 MG capsule BID to TID prn Patient taking differently: 50 mg 3 (three) times daily.  05/29/19   Gillis Santa, MD  QUEtiapine (SEROQUEL) 50 MG tablet Take 1 tablet (50 mg total) by mouth at bedtime. 04/04/19   Ursula Alert, MD  traMADol (ULTRAM) 50 MG tablet Take 1 tablet (50 mg total) by mouth every 6 (six) hours. Patient not taking: Reported on 07/03/2019 06/09/19   Carlynn Spry, PA-C  traZODone (DESYREL) 50 MG tablet Take 75 mg by mouth at  bedtime.  10/04/18   [provider]    Allergies Baclofen, Codeine sulfate, Gabapentin, and Terazosin  Family History  Problem Relation Age of Onset  . Heart disease Mother   . Alcohol abuse Father   . Breast cancer Neg Hx   . Mental illness Neg Hx     Social History Social History   Tobacco Use  . Smoking status: Former Research scientist (life sciences)  . Smokeless tobacco: Never Used  Substance Use Topics  . Alcohol use: Yes    Alcohol/week: 0.0 standard drinks  . Drug use: No    Review of Systems  Constitutional: No fever/chills Eyes: No visual changes. ENT: No sore throat. Cardiovascular: Denies chest pain. Respiratory: Denies shortness of breath. Gastrointestinal: No abdominal pain.  No nausea, no vomiting.  No diarrhea.  No constipation. Genitourinary: Negative for dysuria. Musculoskeletal: Negative for back pain. Skin: Negative for rash. Neurological: Negative for headaches, focal weakness or numbness.  Positive for tremor.  ____________________________________________   PHYSICAL EXAM:  VITAL  SIGNS: ED Triage Vitals  Enc Vitals Group     BP      Pulse      Resp      Temp      Temp src      SpO2      Weight      Height      Head Circumference      Peak Flow      Pain Score      Pain Loc      Pain Edu?      Excl. in [place]?     Constitutional: Alert and oriented.  Tremulous and diaphoretic. Eyes: Conjunctivae are normal. Head: Atraumatic. Nose: No congestion/rhinnorhea. Mouth/Throat: Mucous membranes are moist. Neck: Normal ROM Cardiovascular: Tachycardic, regular rhythm. Grossly normal heart sounds. Respiratory: Normal respiratory effort.  No retractions. Lungs CTAB. Gastrointestinal: Soft and tender to palpation in the right lower quadrant with no rebound or guarding. No distention. Genitourinary: deferred Musculoskeletal: No lower extremity tenderness nor edema. Neurologic:  Normal speech and language. No gross focal neurologic deficits are  appreciated. Skin:  Skin is warm, dry and intact. No rash noted. Psychiatric: Mood and affect are normal. Speech and behavior are normal.  ____________________________________________   LABS (all labs ordered are listed, but only abnormal results are displayed)  Labs Reviewed  COMPREHENSIVE METABOLIC PANEL - Abnormal; Notable for the following components:      Result Value   Glucose, Bld 109 (*)    BUN 37 (*)    All other components within normal limits  CBC WITH DIFFERENTIAL/PLATELET - Abnormal; Notable for the following components:   WBC 10.8 (*)    Neutro Abs 7.8 (*)    All other components within normal limits  URINALYSIS, COMPLETE (UACMP) WITH MICROSCOPIC - Abnormal; Notable for the following components:   Color, Urine YELLOW (*)    APPearance CLEAR (*)    Ketones, ur 5 (*)    Leukocytes,Ua TRACE (*)    All other components within normal limits  LIPASE, BLOOD  TSH  TROPONIN I (HIGH SENSITIVITY)  TROPONIN I (HIGH SENSITIVITY)   ____________________________________________  EKG  ED ECG REPORT I, Blake Divine, the attending physician, personally viewed and interpreted this ECG.   Date: 09/25/2019  EKG Time: 00:36  Rate: 121  Rhythm: sinus tachycardia  Axis: Normal  Intervals:none  ST&T Change: Nonspecific ST changes   PROCEDURES  Procedure(s) performed (including Critical Care):  Procedures   ____________________________________________   INITIAL IMPRESSION / ASSESSMENT AND PLAN / ED COURSE       69 year old female with history of Parkinson's disease, anxiety, and chronic pain presents to the ED with increasing tremors throughout the day today, also complains of some difficulty breathing and right lower quadrant abdominal pain.  Differential includes sepsis, UTI, pneumonia, hyperthyroidism, medication effect, benzodiazepine withdrawal, worsening Parkinson's disease.  Work-up reveals no infectious process, CT abdomen negative for acute process and UA  unremarkable.  Lab work and chest x-ray are also unremarkable.  Heart rate improving with fluid bolus, benzodiazepines, and pain medication.  She regularly takes a high dose of carbidopa levodopa, which she takes every 2 hours.  Her tremulousness is now improved and she seems to be back to her baseline with resolution of diaphoresis.  I doubt sepsis and there does appear to be her baseline Parkinson disease as well as some anxiety contributing to her symptoms.  Given reassuring work-up, she is appropriate for discharge home and I have counseled her to follow-up with her neurologist,  otherwise return to the ED for new or worsening symptoms.  Patient agrees with plan.      ____________________________________________   FINAL CLINICAL IMPRESSION(S) / ED DIAGNOSES  Final diagnoses:  Tremor  Parkinson's disease [facility])     ED Discharge Orders    None       Note:  This document was prepared using Dragon voice recognition software and may include unintentional dictation errors.   Blake Divine, MD 09/25/19 [phone]

## 2019-09-25 ENCOUNTER — Emergency Department: Payer: Medicare Other

## 2019-09-25 ENCOUNTER — Encounter: Payer: Self-pay | Admitting: Radiology

## 2019-09-25 DIAGNOSIS — R0602 Shortness of breath: Secondary | ICD-10-CM | POA: Diagnosis not present

## 2019-09-25 DIAGNOSIS — R251 Tremor, unspecified: Secondary | ICD-10-CM | POA: Diagnosis not present

## 2019-09-25 DIAGNOSIS — R000 Tachycardia, unspecified: Secondary | ICD-10-CM | POA: Diagnosis not present

## 2019-09-25 DIAGNOSIS — R1031 Right lower quadrant pain: Secondary | ICD-10-CM | POA: Diagnosis not present

## 2019-09-25 DIAGNOSIS — G20 Parkinson's disease: Secondary | ICD-10-CM | POA: Diagnosis not present

## 2019-09-25 LAB — CBC WITH DIFFERENTIAL/PLATELET
Abs Immature Granulocytes: 0.05 10*3/uL (ref 0.00–0.07)
Basophils Absolute: 0 10*3/uL (ref 0.0–0.1)
Basophils Relative: 0 %
Eosinophils Absolute: 0.1 10*3/uL (ref 0.0–0.5)
Eosinophils Relative: 1 %
HCT: 44 % (ref 36.0–46.0)
Hemoglobin: 13.7 g/dL (ref 12.0–15.0)
Immature Granulocytes: 1 %
Lymphocytes Relative: 17 %
Lymphs Abs: 1.9 10*3/uL (ref 0.7–4.0)
MCH: 26.9 pg (ref 26.0–34.0)
MCHC: 31.1 g/dL (ref 30.0–36.0)
MCV: 86.4 fL (ref 80.0–100.0)
Monocytes Absolute: 0.9 10*3/uL (ref 0.1–1.0)
Monocytes Relative: 8 %
Neutro Abs: 7.8 10*3/uL — ABNORMAL HIGH (ref 1.7–7.7)
Neutrophils Relative %: 73 %
Platelets: 226 10*3/uL (ref 150–400)
RBC: 5.09 MIL/uL (ref 3.87–5.11)
RDW: 13.8 % (ref 11.5–15.5)
WBC: 10.8 10*3/uL — ABNORMAL HIGH (ref 4.0–10.5)
nRBC: 0 % (ref 0.0–0.2)

## 2019-09-25 LAB — URINALYSIS, COMPLETE (UACMP) WITH MICROSCOPIC
Bacteria, UA: NONE SEEN
Bilirubin Urine: NEGATIVE
Glucose, UA: NEGATIVE mg/dL
Hgb urine dipstick: NEGATIVE
Ketones, ur: 5 mg/dL — AB
Nitrite: NEGATIVE
Protein, ur: NEGATIVE mg/dL
Specific Gravity, Urine: 1.028 (ref 1.005–1.030)
pH: 5 (ref 5.0–8.0)

## 2019-09-25 LAB — COMPREHENSIVE METABOLIC PANEL
ALT: 6 U/L (ref 0–44)
AST: 17 U/L (ref 15–41)
Albumin: 4.4 g/dL (ref 3.5–5.0)
Alkaline Phosphatase: 105 U/L (ref 38–126)
Anion gap: 11 (ref 5–15)
BUN: 37 mg/dL — ABNORMAL HIGH (ref 8–23)
CO2: 24 mmol/L (ref 22–32)
Calcium: 9.4 mg/dL (ref 8.9–10.3)
Chloride: 108 mmol/L (ref 98–111)
Creatinine, Ser: 0.6 mg/dL (ref 0.44–1.00)
GFR calc Af Amer: >60 mL/min (ref >60)
GFR calc non Af Amer: >60 mL/min (ref >60)
Glucose, Bld: 109 mg/dL — ABNORMAL HIGH (ref 70–99)
Potassium: 4.2 mmol/L (ref 3.5–5.1)
Sodium: 143 mmol/L (ref 135–145)
Total Bilirubin: 0.6 mg/dL (ref 0.3–1.2)
Total Protein: 7.4 g/dL (ref 6.5–8.1)

## 2019-09-25 LAB — TROPONIN I (HIGH SENSITIVITY): Troponin I (High Sensitivity): 6 ng/L (ref <18)

## 2019-09-25 LAB — LIPASE, BLOOD: Lipase: 25 U/L (ref 11–51)

## 2019-09-25 LAB — TSH: TSH: 1.778 u[IU]/mL (ref 0.350–4.500)

## 2019-09-25 MED ORDER — MORPHINE SULFATE (PF) 4 MG/ML IV SOLN
4.0000 mg | Freq: Once | INTRAVENOUS | Status: AC
  Administered 2019-09-25: 4 mg via INTRAVENOUS
  Filled 2019-09-25: qty 1

## 2019-09-25 MED ORDER — CLONAZEPAM 0.5 MG PO TABS
2.0000 mg | ORAL_TABLET | Freq: Once | ORAL | Status: AC
  Administered 2019-09-25: 2 mg via ORAL
  Filled 2019-09-25: qty 4

## 2019-09-25 MED ORDER — IOHEXOL 300 MG/ML  SOLN
100.0000 mL | Freq: Once | INTRAMUSCULAR | Status: AC | PRN
  Administered 2019-09-25: 100 mL via INTRAVENOUS

## 2019-09-25 MED ORDER — CARBIDOPA-LEVODOPA ER 25-100 MG PO TBCR
2.0000 | EXTENDED_RELEASE_TABLET | Freq: Once | ORAL | Status: AC
  Administered 2019-09-25: 2 via ORAL
  Filled 2019-09-25: qty 2

## 2019-09-25 NOTE — ED Notes (Signed)
Peripheral IV discontinued. Catheter intact. No signs of infiltration or redness. Gauze applied to IV site.   Discharge instructions reviewed with patient. Questions fielded by this RN. Patient verbalizes understanding of instructions. Patient discharged home in stable condition per jessup. No acute distress noted at time of discharge.   Sig other called - pt's clothing given to husband, pt wheeled to ED pull thru and loaded in car

## 2019-09-25 NOTE — ED Notes (Addendum)
Pt tremulous reports hx of same, extremely diaphoretic, EKG to Dr Charna Archer and notified  Husband contacted by EDP

## 2019-09-25 NOTE — ED Notes (Signed)
Pt to CT att

## 2019-09-25 NOTE — ED Notes (Signed)
Pt moved to c-pod from hallway with Melody, EDT to clean and dress and toilet x 2 pt

## 2019-09-25 NOTE — ED Notes (Signed)
Pt on and off bedpan

## 2019-09-26 DIAGNOSIS — G4752 REM sleep behavior disorder: Secondary | ICD-10-CM | POA: Diagnosis not present

## 2019-09-26 DIAGNOSIS — R42 Dizziness and giddiness: Secondary | ICD-10-CM | POA: Diagnosis not present

## 2019-09-26 DIAGNOSIS — R200 Anesthesia of skin: Secondary | ICD-10-CM | POA: Diagnosis not present

## 2019-09-26 DIAGNOSIS — G20 Parkinson's disease: Secondary | ICD-10-CM | POA: Diagnosis not present

## 2019-09-26 DIAGNOSIS — R202 Paresthesia of skin: Secondary | ICD-10-CM | POA: Diagnosis not present

## 2019-10-02 ENCOUNTER — Ambulatory Visit: Payer: Medicare Other | Attending: Internal Medicine

## 2019-10-02 DIAGNOSIS — Z23 Encounter for immunization: Secondary | ICD-10-CM | POA: Insufficient documentation

## 2019-10-02 NOTE — Progress Notes (Signed)
   Covid-19 Vaccination Clinic  Name:  Sarah Donaldson    MRN: [id number] DOB: 03/11/1951  10/02/2019  [name] was observed post Covid-19 immunization for 15 minutes without incidence. She was provided with Vaccine Information Sheet and instruction to access the V-Safe system.   [name] was instructed to call 911 with any severe reactions post vaccine: Marland Kitchen Difficulty breathing  . Swelling of your face and throat  . A fast heartbeat  . A bad rash all over your body  . Dizziness and weakness    Immunizations Administered    Name Date Dose VIS Date Route   Moderna COVID-19 Vaccine 10/02/2019  3:43 PM 0.5 mL 07/11/2019 Intramuscular   Manufacturer: Moderna   Lot: [id number]   [id number]

## 2019-10-03 DIAGNOSIS — Z96652 Presence of left artificial knee joint: Secondary | ICD-10-CM | POA: Diagnosis not present

## 2019-10-17 DIAGNOSIS — H2511 Age-related nuclear cataract, right eye: Secondary | ICD-10-CM | POA: Diagnosis not present

## 2019-10-24 DIAGNOSIS — G20 Parkinson's disease: Secondary | ICD-10-CM | POA: Diagnosis not present

## 2019-10-24 DIAGNOSIS — R441 Visual hallucinations: Secondary | ICD-10-CM | POA: Diagnosis not present

## 2019-10-24 DIAGNOSIS — R440 Auditory hallucinations: Secondary | ICD-10-CM | POA: Diagnosis not present

## 2019-10-24 DIAGNOSIS — G4752 REM sleep behavior disorder: Secondary | ICD-10-CM | POA: Diagnosis not present

## 2019-10-24 DIAGNOSIS — R251 Tremor, unspecified: Secondary | ICD-10-CM | POA: Diagnosis not present

## 2019-10-24 DIAGNOSIS — R42 Dizziness and giddiness: Secondary | ICD-10-CM | POA: Diagnosis not present

## 2019-10-24 DIAGNOSIS — M545 Low back pain: Secondary | ICD-10-CM | POA: Diagnosis not present

## 2019-10-24 DIAGNOSIS — R202 Paresthesia of skin: Secondary | ICD-10-CM | POA: Diagnosis not present

## 2019-10-24 DIAGNOSIS — R200 Anesthesia of skin: Secondary | ICD-10-CM | POA: Diagnosis not present

## 2019-10-30 ENCOUNTER — Encounter: Payer: Self-pay | Admitting: Internal Medicine

## 2019-10-30 ENCOUNTER — Other Ambulatory Visit: Payer: Self-pay

## 2019-10-30 DIAGNOSIS — Z96652 Presence of left artificial knee joint: Secondary | ICD-10-CM | POA: Diagnosis not present

## 2019-10-31 ENCOUNTER — Ambulatory Visit: Payer: Medicare Other | Attending: Internal Medicine

## 2019-10-31 ENCOUNTER — Other Ambulatory Visit: Payer: Self-pay | Admitting: *Deleted

## 2019-10-31 ENCOUNTER — Inpatient Hospital Stay: Payer: Medicare Other | Attending: Internal Medicine

## 2019-10-31 ENCOUNTER — Inpatient Hospital Stay (HOSPITAL_BASED_OUTPATIENT_CLINIC_OR_DEPARTMENT_OTHER): Payer: Medicare Other | Admitting: Internal Medicine

## 2019-10-31 DIAGNOSIS — Z96652 Presence of left artificial knee joint: Secondary | ICD-10-CM | POA: Insufficient documentation

## 2019-10-31 DIAGNOSIS — Z79899 Other long term (current) drug therapy: Secondary | ICD-10-CM | POA: Diagnosis not present

## 2019-10-31 DIAGNOSIS — C8299 Follicular lymphoma, unspecified, extranodal and solid organ sites: Secondary | ICD-10-CM | POA: Insufficient documentation

## 2019-10-31 DIAGNOSIS — Z8249 Family history of ischemic heart disease and other diseases of the circulatory system: Secondary | ICD-10-CM | POA: Diagnosis not present

## 2019-10-31 DIAGNOSIS — Z87891 Personal history of nicotine dependence: Secondary | ICD-10-CM | POA: Diagnosis not present

## 2019-10-31 DIAGNOSIS — Z7952 Long term (current) use of systemic steroids: Secondary | ICD-10-CM | POA: Diagnosis not present

## 2019-10-31 DIAGNOSIS — Z7982 Long term (current) use of aspirin: Secondary | ICD-10-CM | POA: Insufficient documentation

## 2019-10-31 DIAGNOSIS — G20 Parkinson's disease: Secondary | ICD-10-CM | POA: Insufficient documentation

## 2019-10-31 DIAGNOSIS — Z23 Encounter for immunization: Secondary | ICD-10-CM

## 2019-10-31 DIAGNOSIS — M25569 Pain in unspecified knee: Secondary | ICD-10-CM | POA: Diagnosis not present

## 2019-10-31 DIAGNOSIS — E785 Hyperlipidemia, unspecified: Secondary | ICD-10-CM | POA: Diagnosis not present

## 2019-10-31 LAB — CBC WITH DIFFERENTIAL/PLATELET
Abs Immature Granulocytes: 0.01 10*3/uL (ref 0.00–0.07)
Basophils Absolute: 0 10*3/uL (ref 0.0–0.1)
Basophils Relative: 0 %
Eosinophils Absolute: 0.1 10*3/uL (ref 0.0–0.5)
Eosinophils Relative: 2 %
HCT: 42.7 % (ref 36.0–46.0)
Hemoglobin: 13.3 g/dL (ref 12.0–15.0)
Immature Granulocytes: 0 %
Lymphocytes Relative: 26 %
Lymphs Abs: 1.4 10*3/uL (ref 0.7–4.0)
MCH: 27.2 pg (ref 26.0–34.0)
MCHC: 31.1 g/dL (ref 30.0–36.0)
MCV: 87.3 fL (ref 80.0–100.0)
Monocytes Absolute: 0.6 10*3/uL (ref 0.1–1.0)
Monocytes Relative: 10 %
Neutro Abs: 3.3 10*3/uL (ref 1.7–7.7)
Neutrophils Relative %: 62 %
Platelets: 164 10*3/uL (ref 150–400)
RBC: 4.89 MIL/uL (ref 3.87–5.11)
RDW: 15.6 % — ABNORMAL HIGH (ref 11.5–15.5)
WBC: 5.3 10*3/uL (ref 4.0–10.5)
nRBC: 0 % (ref 0.0–0.2)

## 2019-10-31 LAB — COMPREHENSIVE METABOLIC PANEL
ALT: <5 U/L (ref 0–44)
AST: 16 U/L (ref 15–41)
Albumin: 4.3 g/dL (ref 3.5–5.0)
Alkaline Phosphatase: 87 U/L (ref 38–126)
Anion gap: 8 (ref 5–15)
BUN: 31 mg/dL — ABNORMAL HIGH (ref 8–23)
CO2: 27 mmol/L (ref 22–32)
Calcium: 8.8 mg/dL — ABNORMAL LOW (ref 8.9–10.3)
Chloride: 106 mmol/L (ref 98–111)
Creatinine, Ser: 0.7 mg/dL (ref 0.44–1.00)
GFR calc Af Amer: >60 mL/min (ref >60)
GFR calc non Af Amer: >60 mL/min (ref >60)
Glucose, Bld: 92 mg/dL (ref 70–99)
Potassium: 3.7 mmol/L (ref 3.5–5.1)
Sodium: 141 mmol/L (ref 135–145)
Total Bilirubin: 0.6 mg/dL (ref 0.3–1.2)
Total Protein: 6.5 g/dL (ref 6.5–8.1)

## 2019-10-31 LAB — LACTATE DEHYDROGENASE: LDH: 139 U/L (ref 98–192)

## 2019-10-31 NOTE — Progress Notes (Signed)
[place] OFFICE PROGRESS NOTE  Patient Care Team: Cletis Athens, MD as PCP - General (Cardiology)  Cancer Staging No matching staging information was found for the patient.   Oncology History Overview Note  1. Superficial Protidectomy (right side) December 17/2013.  Consistent with low-grade CD10 and [id number] to follicular lymphoma grade 1-2.  Is positive for CD19 and cough of light chain.bone marrow aspiration and biopsy is negative .  PET scan was positive for some increased uptake in the spleen; Stage IVA disease PET scan is consistent with bilateral neck lymph node (December, 2013) 2. Weekly rituxan has been started.  August 24, 2012 3. Patient finished the last dose of Rituxan on September 14 2012  #  maintenance rituximab since April of 2014 patient has finished rituximabin December of 2015  # PET MARCH 2017 -Splenic recurrence; CT images stable. 4th October 2017- PET - NED.   # chronic back pain/PN/ Parkinsons [Dr.Potter] -----------------------------------------------   DIAGNOSIS: FOLLICULAR LYMPHOMA  STAGE:  IV       ;GOALS: control  CURRENT/MOST RECENT THERAPY: surveillaince    Follicular lymphoma of extranodal and solid organ sites [facility])      INTERVAL HISTORY:  Sarah Donaldson 69 y.o.  adult pleasant patient above history of Low-grade non-Hodgkin's lymphoma is here for follow-up.  In the interim patient had left TKA.  Patient had 2 falls she attributes to recent TKA.   Patient denies any new lumps or bumps.  Denies any worsening shortness of the cough.  Chronic fatigue.  Review of Systems  Constitutional: Positive for malaise/fatigue. Negative for chills, diaphoresis, fever and weight loss.  HENT: Negative for nosebleeds and sore throat.   Eyes: Negative for double vision.  Respiratory: Negative for cough, hemoptysis, sputum production, shortness of breath and wheezing.   Cardiovascular: Negative for chest pain, palpitations, orthopnea  and leg swelling.  Gastrointestinal: Negative for abdominal pain, blood in stool, constipation, diarrhea, heartburn, melena, nausea and vomiting.  Genitourinary: Negative for dysuria, frequency and urgency.  Musculoskeletal: Positive for back pain and joint pain.  Skin: Negative.  Negative for itching and rash.  Neurological: Positive for dizziness and tremors (Chronic and attributed to Parkinson's.). Negative for tingling, focal weakness, weakness and headaches.  Endo/Heme/Allergies: Does not bruise/bleed easily.  Psychiatric/Behavioral: Negative for depression. The patient is not nervous/anxious and does not have insomnia.      PAST MEDICAL HISTORY :  Past Medical History:  Diagnosis Date  . Cancer ([place])    Non-hodgkin's lymphoma (in remission)  . GERD (gastroesophageal reflux disease)    well-controlled w/ omeprazole  . Hyperlipidemia   . Knee joint cyst, left 02/07/2018  . Parkinson disease ([place])   . Parkinson disease ([place])     PAST SURGICAL HISTORY :   Past Surgical History:  Procedure Laterality Date  . BACK SURGERY    . BUNIONECTOMY    . NASAL SEPTUM SURGERY    . RETINAL DETACHMENT SURGERY    . TOTAL KNEE ARTHROPLASTY Left 06/07/2019   Procedure: TOTAL KNEE ARTHROPLASTY;  Surgeon: Lovell Sheehan, MD;  Location: [facility] ORS;  Service: Orthopedics;  Laterality: Left;    FAMILY HISTORY :   Family History  Problem Relation Age of Onset  . Heart disease Mother   . Alcohol abuse Father   . Breast cancer Neg Hx   . Mental illness Neg Hx     SOCIAL HISTORY:   Social History   Tobacco Use  . Smoking status: Former Research scientist (life sciences)  . Smokeless tobacco:  Never Used  Substance Use Topics  . Alcohol use: Yes    Alcohol/week: 0.0 standard drinks  . Drug use: No    ALLERGIES:  is allergic to baclofen; codeine sulfate; gabapentin; and terazosin.  MEDICATIONS:  Current Outpatient Medications  Medication Sig Dispense Refill  . acetaminophen (TYLENOL) 650 MG CR tablet Take 1,300  mg by mouth every 8 (eight) hours as needed for pain.     Marland Kitchen aspirin 81 MG chewable tablet Chew 1 tablet (81 mg total) by mouth 2 (two) times daily. 30 tablet 1  . carbidopa-levodopa (SINEMET IR) 25-100 MG tablet Take 2 tablets by mouth 6 (six) times daily. Take 2 tablets q2 hours between 0800 and 2000.  Pt can take an extra 1-2 tabs at night as needed for tightness    . clonazePAM (KLONOPIN) 1 MG tablet Take 1 mg by mouth 4 (four) times daily.     . furosemide (LASIX) 20 MG tablet Take 20 mg by mouth daily as needed for fluid.     Marland Kitchen HYDROcodone-acetaminophen (NORCO/VICODIN) 5-325 MG tablet Take 1-2 tablets by mouth every 4 (four) hours as needed (pain score 4-6). 60 tablet 0  . methocarbamol (ROBAXIN) 500 MG tablet Take 1 tablet (500 mg total) by mouth every 6 (six) hours as needed for muscle spasms. 30 tablet 0  . metoCLOPramide (REGLAN) 5 MG tablet Take 1 tablet (5 mg total) by mouth every 8 (eight) hours as needed for nausea (if ondansetron (ZOFRAN) ineffective.). 30 tablet 0  . neomycin-polymyxin b-dexamethasone (MAXITROL) 3.5-10000-0.1 OINT Place 1 application into the left eye at bedtime.     Marland Kitchen omeprazole (PRILOSEC) 20 MG capsule Take 1 capsule (20 mg total) by mouth daily. 90 capsule 0  . prednisoLONE acetate (PRED FORTE) 1 % ophthalmic suspension Place 1 drop into the left eye daily.  5  . pregabalin (LYRICA) 50 MG capsule BID to TID prn (Patient taking differently: 50 mg 3 (three) times daily. ) 90 capsule 2  . QUEtiapine (SEROQUEL) 50 MG tablet Take 1 tablet (50 mg total) by mouth at bedtime. 30 tablet 1  . traMADol (ULTRAM) 50 MG tablet Take 1 tablet (50 mg total) by mouth every 6 (six) hours. 30 tablet 1  . traZODone (DESYREL) 50 MG tablet Take 75 mg by mouth at bedtime.      No current facility-administered medications for this visit.    PHYSICAL EXAMINATION: ECOG PERFORMANCE STATUS: 0 - Asymptomatic  BP 119/81   Pulse 82   Temp (!) 97.4 F (36.3 C) (Tympanic)   Resp 20    There were no vitals filed for this visit.  Physical Exam  Constitutional: She is oriented to person, place, and time.  She is walking with a rolling walker.  Accompanied by husband.  Patient is frail-appearing.  HENT:  Head: Normocephalic and atraumatic.  Mouth/Throat: Oropharynx is clear and moist. No oropharyngeal exudate.  Eyes: Pupils are equal, round, and reactive to light.  Cardiovascular: Normal rate and regular rhythm.  Pulmonary/Chest: Effort normal and breath sounds normal. No respiratory distress. She has no wheezes.  Abdominal: Soft. Bowel sounds are normal. She exhibits no distension and no mass. There is no abdominal tenderness. There is no rebound and no guarding.  Musculoskeletal:        General: No tenderness or edema. Normal range of motion.     Cervical back: Normal range of motion and neck supple.  Neurological: She is alert and oriented to person, place, and time.  Skin: Skin is  warm.  Psychiatric: Affect normal.    LABORATORY DATA:  I have reviewed the data as listed    Component Value Date/Time   NA 141 10/31/2019 1343   NA 139 12/03/2014 1038   K 3.7 10/31/2019 1343   K 3.6 12/03/2014 1038   CL 106 10/31/2019 1343   CL 106 12/03/2014 1038   CO2 27 10/31/2019 1343   CO2 28 12/03/2014 1038   GLUCOSE 92 10/31/2019 1343   GLUCOSE 94 12/03/2014 1038   BUN 31 (H) 10/31/2019 1343   BUN 21 (H) 12/03/2014 1038   CREATININE 0.70 10/31/2019 1343   CREATININE 0.65 12/03/2014 1038   CALCIUM 8.8 (L) 10/31/2019 1343   CALCIUM 9.1 12/03/2014 1038   PROT 6.5 10/31/2019 1343   PROT 6.9 12/03/2014 1038   ALBUMIN 4.3 10/31/2019 1343   ALBUMIN 4.2 12/03/2014 1038   AST 16 10/31/2019 1343   AST 19 12/03/2014 1038   ALT <5 10/31/2019 1343   ALT < 5 (L) 12/03/2014 1038   ALKPHOS 87 10/31/2019 1343   ALKPHOS 89 12/03/2014 1038   BILITOT 0.6 10/31/2019 1343   BILITOT 0.8 12/03/2014 1038   GFRNONAA >60 10/31/2019 1343   GFRNONAA >60 12/03/2014 1038   GFRAA >60  10/31/2019 1343   GFRAA >60 12/03/2014 1038    No results found for: SPEP, UPEP  Lab Results  Component Value Date   WBC 5.3 10/31/2019   NEUTROABS 3.3 10/31/2019   HGB 13.3 10/31/2019   HCT 42.7 10/31/2019   MCV 87.3 10/31/2019   PLT 164 10/31/2019      Chemistry      Component Value Date/Time   NA 141 10/31/2019 1343   NA 139 12/03/2014 1038   K 3.7 10/31/2019 1343   K 3.6 12/03/2014 1038   CL 106 10/31/2019 1343   CL 106 12/03/2014 1038   CO2 27 10/31/2019 1343   CO2 28 12/03/2014 1038   BUN 31 (H) 10/31/2019 1343   BUN 21 (H) 12/03/2014 1038   CREATININE 0.70 10/31/2019 1343   CREATININE 0.65 12/03/2014 1038      Component Value Date/Time   CALCIUM 8.8 (L) 10/31/2019 1343   CALCIUM 9.1 12/03/2014 1038   ALKPHOS 87 10/31/2019 1343   ALKPHOS 89 12/03/2014 1038   AST 16 10/31/2019 1343   AST 19 12/03/2014 1038   ALT <5 10/31/2019 1343   ALT < 5 (L) 12/03/2014 1038   BILITOT 0.6 10/31/2019 1343   BILITOT 0.8 12/03/2014 1038      RADIOGRAPHIC STUDIES: I have personally reviewed the radiological images as listed and agreed with the findings in the report. No results found.   ASSESSMENT & PLAN:  Follicular lymphoma of extranodal and solid organ sites [facility]) #Stage IV low-grade follicular lymphoma; involving the spleen. Rituxan maintenance last December 2015.  Currently on surveillance.  # FEB 2021- [abdominal pain]CT scan negative for any recurrence; STABLE; .  Continue surveillance at this time.  Discussed that would recommend imaging only if symptomatic; avoid radiation exposure.  #Knee pain s/p TKA recent fall recommend follow-up with PCP/orthopedics.  I spoke at length with the patient's family/HUsband- regarding the patient's clinical status/plan of care.  Family agreement.   #  DISPOSITION:  # follow up in 6 months-MD/labs-CBC/CMP/LDH;Dr.B     Orders Placed This Encounter  Procedures  . CBC with Differential    Standing Status:   Future     Standing Expiration Date:   10/30/2020  . Comprehensive metabolic panel  Standing Status:   Future    Standing Expiration Date:   10/30/2020  . Lactate dehydrogenase    Standing Status:   Future    Standing Expiration Date:   10/30/2020   All questions were answered. The patient knows to call the clinic with any problems, questions or concerns.      Cammie Sickle, MD 10/31/2019 2:27 PM

## 2019-10-31 NOTE — Progress Notes (Signed)
   Covid-19 Vaccination Clinic  Name:  Sarah Donaldson    MRN: [id number] DOB: 1951-04-18  10/31/2019  Sarah Donaldson was observed post Covid-19 immunization for 15 minutes without incident. She was provided with Vaccine Information Sheet and instruction to access the V-Safe system.   Sarah Donaldson was instructed to call 911 with any severe reactions post vaccine: Marland Kitchen Difficulty breathing  . Swelling of face and throat  . A fast heartbeat  . A bad rash all over body  . Dizziness and weakness   Immunizations Administered    Name Date Dose VIS Date Route   Moderna COVID-19 Vaccine 10/31/2019  3:41 PM 0.5 mL 07/11/2019 Intramuscular   Manufacturer: Levan Hurst   LotUT:[id number]   [id number]

## 2019-10-31 NOTE — Assessment & Plan Note (Addendum)
#  Stage IV low-grade follicular lymphoma; involving the spleen. Rituxan maintenance last December 2015.  Currently on surveillance.  # FEB 2021- [abdominal pain]CT scan negative for any recurrence; STABLE; .  Continue surveillance at this time.  Discussed that would recommend imaging only if symptomatic; avoid radiation exposure.  #Knee pain s/p TKA recent fall recommend follow-up with PCP/orthopedics.  I spoke at length with the patient's family/HUsband- regarding the patient's clinical status/plan of care.  Family agreement.   #  DISPOSITION:  # follow up in 6 months-MD/labs-CBC/CMP/LDH;Dr.B

## 2019-11-03 DIAGNOSIS — Z96652 Presence of left artificial knee joint: Secondary | ICD-10-CM | POA: Diagnosis not present

## 2019-11-06 DIAGNOSIS — Z96652 Presence of left artificial knee joint: Secondary | ICD-10-CM | POA: Diagnosis not present

## 2019-11-10 DIAGNOSIS — Z96652 Presence of left artificial knee joint: Secondary | ICD-10-CM | POA: Diagnosis not present

## 2019-11-13 DIAGNOSIS — Z96652 Presence of left artificial knee joint: Secondary | ICD-10-CM | POA: Diagnosis not present

## 2019-11-15 DIAGNOSIS — Z96652 Presence of left artificial knee joint: Secondary | ICD-10-CM | POA: Diagnosis not present

## 2019-11-21 DIAGNOSIS — R441 Visual hallucinations: Secondary | ICD-10-CM | POA: Diagnosis not present

## 2019-11-21 DIAGNOSIS — F22 Delusional disorders: Secondary | ICD-10-CM | POA: Diagnosis not present

## 2019-11-21 DIAGNOSIS — T50905A Adverse effect of unspecified drugs, medicaments and biological substances, initial encounter: Secondary | ICD-10-CM | POA: Diagnosis not present

## 2019-11-21 DIAGNOSIS — G20 Parkinson's disease: Secondary | ICD-10-CM | POA: Diagnosis not present

## 2019-11-21 DIAGNOSIS — F060 Psychotic disorder with hallucinations due to known physiological condition: Secondary | ICD-10-CM | POA: Diagnosis not present

## 2019-11-22 DIAGNOSIS — Z96652 Presence of left artificial knee joint: Secondary | ICD-10-CM | POA: Diagnosis not present

## 2019-11-24 DIAGNOSIS — Z96652 Presence of left artificial knee joint: Secondary | ICD-10-CM | POA: Diagnosis not present

## 2019-11-29 DIAGNOSIS — Z96652 Presence of left artificial knee joint: Secondary | ICD-10-CM | POA: Diagnosis not present

## 2019-12-05 DIAGNOSIS — M545 Low back pain: Secondary | ICD-10-CM | POA: Diagnosis not present

## 2019-12-05 DIAGNOSIS — Z96652 Presence of left artificial knee joint: Secondary | ICD-10-CM | POA: Diagnosis not present

## 2019-12-07 DIAGNOSIS — R42 Dizziness and giddiness: Secondary | ICD-10-CM | POA: Diagnosis not present

## 2019-12-07 DIAGNOSIS — G20 Parkinson's disease: Secondary | ICD-10-CM | POA: Diagnosis not present

## 2019-12-07 DIAGNOSIS — R202 Paresthesia of skin: Secondary | ICD-10-CM | POA: Diagnosis not present

## 2019-12-07 DIAGNOSIS — R441 Visual hallucinations: Secondary | ICD-10-CM | POA: Diagnosis not present

## 2019-12-07 DIAGNOSIS — G4752 REM sleep behavior disorder: Secondary | ICD-10-CM | POA: Diagnosis not present

## 2019-12-07 DIAGNOSIS — F329 Major depressive disorder, single episode, unspecified: Secondary | ICD-10-CM | POA: Diagnosis not present

## 2019-12-07 DIAGNOSIS — M792 Neuralgia and neuritis, unspecified: Secondary | ICD-10-CM | POA: Diagnosis not present

## 2019-12-07 DIAGNOSIS — R200 Anesthesia of skin: Secondary | ICD-10-CM | POA: Diagnosis not present

## 2019-12-07 DIAGNOSIS — F419 Anxiety disorder, unspecified: Secondary | ICD-10-CM | POA: Diagnosis not present

## 2019-12-08 DIAGNOSIS — I10 Essential (primary) hypertension: Secondary | ICD-10-CM | POA: Diagnosis not present

## 2019-12-08 DIAGNOSIS — E7849 Other hyperlipidemia: Secondary | ICD-10-CM | POA: Diagnosis not present

## 2019-12-08 DIAGNOSIS — E034 Atrophy of thyroid (acquired): Secondary | ICD-10-CM | POA: Diagnosis not present

## 2019-12-08 DIAGNOSIS — R5381 Other malaise: Secondary | ICD-10-CM | POA: Diagnosis not present

## 2019-12-11 ENCOUNTER — Emergency Department: Payer: Medicare Other

## 2019-12-11 ENCOUNTER — Encounter: Payer: Self-pay | Admitting: Emergency Medicine

## 2019-12-11 ENCOUNTER — Other Ambulatory Visit: Payer: Self-pay

## 2019-12-11 ENCOUNTER — Inpatient Hospital Stay
Admission: EM | Admit: 2019-12-11 | Discharge: 2019-12-13 | DRG: 086 | Disposition: A | Discharge status: Home / Self Care | Payer: Medicare Other | Attending: Internal Medicine | Admitting: Internal Medicine

## 2019-12-11 ENCOUNTER — Inpatient Hospital Stay: Payer: Medicare Other

## 2019-12-11 DIAGNOSIS — M7918 Myalgia, other site: Secondary | ICD-10-CM | POA: Diagnosis present

## 2019-12-11 DIAGNOSIS — Z8249 Family history of ischemic heart disease and other diseases of the circulatory system: Secondary | ICD-10-CM | POA: Diagnosis not present

## 2019-12-11 DIAGNOSIS — Z20822 Contact with and (suspected) exposure to covid-19: Secondary | ICD-10-CM | POA: Diagnosis present

## 2019-12-11 DIAGNOSIS — R4781 Slurred speech: Secondary | ICD-10-CM | POA: Diagnosis present

## 2019-12-11 DIAGNOSIS — Z7982 Long term (current) use of aspirin: Secondary | ICD-10-CM | POA: Diagnosis not present

## 2019-12-11 DIAGNOSIS — F29 Unspecified psychosis not due to a substance or known physiological condition: Secondary | ICD-10-CM | POA: Diagnosis present

## 2019-12-11 DIAGNOSIS — Z885 Allergy status to narcotic agent status: Secondary | ICD-10-CM

## 2019-12-11 DIAGNOSIS — C8590 Non-Hodgkin lymphoma, unspecified, unspecified site: Secondary | ICD-10-CM | POA: Diagnosis present

## 2019-12-11 DIAGNOSIS — R4182 Altered mental status, unspecified: Secondary | ICD-10-CM | POA: Diagnosis not present

## 2019-12-11 DIAGNOSIS — R519 Headache, unspecified: Secondary | ICD-10-CM | POA: Diagnosis not present

## 2019-12-11 DIAGNOSIS — H9190 Unspecified hearing loss, unspecified ear: Secondary | ICD-10-CM | POA: Diagnosis present

## 2019-12-11 DIAGNOSIS — S065X9A Traumatic subdural hemorrhage with loss of consciousness of unspecified duration, initial encounter: Secondary | ICD-10-CM | POA: Diagnosis present

## 2019-12-11 DIAGNOSIS — G894 Chronic pain syndrome: Secondary | ICD-10-CM | POA: Diagnosis present

## 2019-12-11 DIAGNOSIS — H3320 Serous retinal detachment, unspecified eye: Secondary | ICD-10-CM | POA: Diagnosis present

## 2019-12-11 DIAGNOSIS — Z87891 Personal history of nicotine dependence: Secondary | ICD-10-CM | POA: Diagnosis not present

## 2019-12-11 DIAGNOSIS — G20 Parkinson's disease: Secondary | ICD-10-CM | POA: Diagnosis present

## 2019-12-11 DIAGNOSIS — Z811 Family history of alcohol abuse and dependence: Secondary | ICD-10-CM | POA: Diagnosis not present

## 2019-12-11 DIAGNOSIS — F22 Delusional disorders: Secondary | ICD-10-CM | POA: Diagnosis not present

## 2019-12-11 DIAGNOSIS — M542 Cervicalgia: Secondary | ICD-10-CM | POA: Diagnosis not present

## 2019-12-11 DIAGNOSIS — R2681 Unsteadiness on feet: Secondary | ICD-10-CM | POA: Diagnosis not present

## 2019-12-11 DIAGNOSIS — Z66 Do not resuscitate: Secondary | ICD-10-CM | POA: Diagnosis present

## 2019-12-11 DIAGNOSIS — Z79899 Other long term (current) drug therapy: Secondary | ICD-10-CM | POA: Diagnosis not present

## 2019-12-11 DIAGNOSIS — S065X0A Traumatic subdural hemorrhage without loss of consciousness, initial encounter: Secondary | ICD-10-CM | POA: Diagnosis not present

## 2019-12-11 DIAGNOSIS — F0280 Dementia in other diseases classified elsewhere without behavioral disturbance: Secondary | ICD-10-CM | POA: Diagnosis present

## 2019-12-11 DIAGNOSIS — Y92000 Kitchen of unspecified non-institutional (private) residence as  the place of occurrence of the external cause: Secondary | ICD-10-CM | POA: Diagnosis not present

## 2019-12-11 DIAGNOSIS — G20A1 Parkinson's disease without dyskinesia, without mention of fluctuations: Secondary | ICD-10-CM | POA: Diagnosis present

## 2019-12-11 DIAGNOSIS — G479 Sleep disorder, unspecified: Secondary | ICD-10-CM | POA: Diagnosis present

## 2019-12-11 DIAGNOSIS — G8929 Other chronic pain: Secondary | ICD-10-CM | POA: Diagnosis not present

## 2019-12-11 DIAGNOSIS — Z03818 Encounter for observation for suspected exposure to other biological agents ruled out: Secondary | ICD-10-CM | POA: Diagnosis not present

## 2019-12-11 DIAGNOSIS — F419 Anxiety disorder, unspecified: Secondary | ICD-10-CM | POA: Diagnosis present

## 2019-12-11 DIAGNOSIS — Z888 Allergy status to other drugs, medicaments and biological substances status: Secondary | ICD-10-CM

## 2019-12-11 DIAGNOSIS — R296 Repeated falls: Secondary | ICD-10-CM | POA: Diagnosis present

## 2019-12-11 DIAGNOSIS — M50221 Other cervical disc displacement at C4-C5 level: Secondary | ICD-10-CM | POA: Diagnosis not present

## 2019-12-11 DIAGNOSIS — S065XAA Traumatic subdural hemorrhage with loss of consciousness status unknown, initial encounter: Secondary | ICD-10-CM | POA: Diagnosis present

## 2019-12-11 DIAGNOSIS — Z96652 Presence of left artificial knee joint: Secondary | ICD-10-CM | POA: Diagnosis present

## 2019-12-11 DIAGNOSIS — W19XXXA Unspecified fall, initial encounter: Secondary | ICD-10-CM | POA: Diagnosis present

## 2019-12-11 DIAGNOSIS — E785 Hyperlipidemia, unspecified: Secondary | ICD-10-CM | POA: Diagnosis present

## 2019-12-11 DIAGNOSIS — K219 Gastro-esophageal reflux disease without esophagitis: Secondary | ICD-10-CM | POA: Diagnosis present

## 2019-12-11 LAB — COMPREHENSIVE METABOLIC PANEL
ALT: 6 U/L (ref 0–44)
AST: 32 U/L (ref 15–41)
Albumin: 4.1 g/dL (ref 3.5–5.0)
Alkaline Phosphatase: 86 U/L (ref 38–126)
Anion gap: 7 (ref 5–15)
BUN: 30 mg/dL — ABNORMAL HIGH (ref 8–23)
CO2: 26 mmol/L (ref 22–32)
Calcium: 8.6 mg/dL — ABNORMAL LOW (ref 8.9–10.3)
Chloride: 111 mmol/L (ref 98–111)
Creatinine, Ser: 0.48 mg/dL (ref 0.44–1.00)
GFR calc Af Amer: >60 mL/min (ref >60)
GFR calc non Af Amer: >60 mL/min (ref >60)
Glucose, Bld: 98 mg/dL (ref 70–99)
Potassium: 3.5 mmol/L (ref 3.5–5.1)
Sodium: 144 mmol/L (ref 135–145)
Total Bilirubin: 1.2 mg/dL (ref 0.3–1.2)
Total Protein: 6.5 g/dL (ref 6.5–8.1)

## 2019-12-11 LAB — CBC WITH DIFFERENTIAL/PLATELET
Abs Immature Granulocytes: 0.01 10*3/uL (ref 0.00–0.07)
Basophils Absolute: 0 10*3/uL (ref 0.0–0.1)
Basophils Relative: 0 %
Eosinophils Absolute: 0.1 10*3/uL (ref 0.0–0.5)
Eosinophils Relative: 2 %
HCT: 38.7 % (ref 36.0–46.0)
Hemoglobin: 12.6 g/dL (ref 12.0–15.0)
Immature Granulocytes: 0 %
Lymphocytes Relative: 23 %
Lymphs Abs: 1.2 10*3/uL (ref 0.7–4.0)
MCH: 28.6 pg (ref 26.0–34.0)
MCHC: 32.6 g/dL (ref 30.0–36.0)
MCV: 88 fL (ref 80.0–100.0)
Monocytes Absolute: 0.5 10*3/uL (ref 0.1–1.0)
Monocytes Relative: 10 %
Neutro Abs: 3.5 10*3/uL (ref 1.7–7.7)
Neutrophils Relative %: 65 %
Platelets: 184 10*3/uL (ref 150–400)
RBC: 4.4 MIL/uL (ref 3.87–5.11)
RDW: 15.7 % — ABNORMAL HIGH (ref 11.5–15.5)
WBC: 5.4 10*3/uL (ref 4.0–10.5)
nRBC: 0 % (ref 0.0–0.2)

## 2019-12-11 LAB — RESPIRATORY PANEL BY RT PCR (FLU A&B, COVID)
Influenza A by PCR: NEGATIVE
Influenza B by PCR: NEGATIVE
SARS Coronavirus 2 by RT PCR: NEGATIVE

## 2019-12-11 MED ORDER — SODIUM CHLORIDE 0.9 % IV SOLN: 250.0000 mL | INTRAVENOUS | Status: DC | PRN

## 2019-12-11 MED ORDER — MELATONIN 5 MG PO TABS
5.0000 mg | ORAL_TABLET | Freq: Every day | ORAL | Status: DC
  Administered 2019-12-11 – 2019-12-12 (×2): 5 mg via ORAL
  Filled 2019-12-11 (×3): qty 1

## 2019-12-11 MED ORDER — DICLOFENAC SODIUM 75 MG PO TBEC
75.0000 mg | DELAYED_RELEASE_TABLET | Freq: Two times a day (BID) | ORAL | Status: DC
  Administered 2019-12-11 – 2019-12-12 (×2): 75 mg via ORAL
  Filled 2019-12-11 (×3): qty 1

## 2019-12-11 MED ORDER — SODIUM CHLORIDE 0.9% FLUSH: 3.0000 mL | INTRAVENOUS | Status: DC | PRN

## 2019-12-11 MED ORDER — TIZANIDINE HCL 4 MG PO TABS
2.0000 mg | ORAL_TABLET | Freq: Three times a day (TID) | ORAL | Status: DC | PRN
  Filled 2019-12-11: qty 1

## 2019-12-11 MED ORDER — PANTOPRAZOLE SODIUM 40 MG PO TBEC
40.0000 mg | DELAYED_RELEASE_TABLET | Freq: Every day | ORAL | Status: DC
  Administered 2019-12-11 – 2019-12-13 (×3): 40 mg via ORAL
  Filled 2019-12-11 (×2): qty 1

## 2019-12-11 MED ORDER — SENNA 8.6 MG PO TABS
1.0000 | ORAL_TABLET | Freq: Two times a day (BID) | ORAL | Status: DC
  Administered 2019-12-11 – 2019-12-12 (×2): 8.6 mg via ORAL
  Filled 2019-12-11 (×3): qty 1

## 2019-12-11 MED ORDER — IBUPROFEN 400 MG PO TABS: 400.0000 mg | ORAL_TABLET | Freq: Four times a day (QID) | ORAL | Status: DC | PRN

## 2019-12-11 MED ORDER — CARBIDOPA-LEVODOPA 25-100 MG PO TABS
2.0000 | ORAL_TABLET | ORAL | Status: DC
  Administered 2019-12-12 – 2019-12-13 (×12): 2 via ORAL
  Filled 2019-12-11 (×15): qty 2

## 2019-12-11 MED ORDER — DONEPEZIL HCL 5 MG PO TABS
5.0000 mg | ORAL_TABLET | Freq: Every day | ORAL | Status: DC
  Administered 2019-12-11 – 2019-12-12 (×2): 5 mg via ORAL
  Filled 2019-12-11 (×2): qty 1

## 2019-12-11 MED ORDER — SODIUM CHLORIDE 0.9% FLUSH
3.0000 mL | Freq: Two times a day (BID) | INTRAVENOUS | Status: DC
  Administered 2019-12-11 – 2019-12-13 (×4): 3 mL via INTRAVENOUS

## 2019-12-11 MED ORDER — HALOPERIDOL 1 MG PO TABS
1.0000 mg | ORAL_TABLET | ORAL | Status: DC | PRN
  Filled 2019-12-11: qty 1

## 2019-12-11 MED ORDER — ESCITALOPRAM OXALATE 10 MG PO TABS
10.0000 mg | ORAL_TABLET | Freq: Every day | ORAL | Status: DC
  Administered 2019-12-11 – 2019-12-13 (×3): 10 mg via ORAL
  Filled 2019-12-11 (×2): qty 1

## 2019-12-11 MED ORDER — HALOPERIDOL LACTATE 5 MG/ML IJ SOLN: 1.0000 mg | INTRAMUSCULAR | Status: DC | PRN

## 2019-12-11 MED ORDER — SODIUM CHLORIDE 0.9 % IV SOLN: 250.0000 mL | INTRAVENOUS | Status: AC | PRN

## 2019-12-11 MED ORDER — ACETAMINOPHEN 325 MG PO TABS
650.0000 mg | ORAL_TABLET | Freq: Three times a day (TID) | ORAL | Status: DC | PRN
  Administered 2019-12-11: 23:00:00 650 mg via ORAL
  Filled 2019-12-11: qty 2

## 2019-12-11 MED ORDER — PREGABALIN 50 MG PO CAPS
100.0000 mg | ORAL_CAPSULE | Freq: Three times a day (TID) | ORAL | Status: DC
  Administered 2019-12-11 – 2019-12-13 (×6): 100 mg via ORAL
  Filled 2019-12-11 (×6): qty 2

## 2019-12-11 MED ORDER — PREDNISOLONE ACETATE 1 % OP SUSP
1.0000 [drp] | Freq: Every day | OPHTHALMIC | Status: DC
  Filled 2019-12-11: qty 1

## 2019-12-11 MED ORDER — NEOMYCIN-POLYMYXIN-DEXAMETH 3.5-10000-0.1 OP OINT
1.0000 "application " | TOPICAL_OINTMENT | Freq: Every day | OPHTHALMIC | Status: DC
  Administered 2019-12-11 – 2019-12-12 (×2): 1 via OPHTHALMIC
  Filled 2019-12-11: qty 3.5

## 2019-12-11 MED ORDER — ENTACAPONE 200 MG PO TABS
200.0000 mg | ORAL_TABLET | ORAL | Status: DC
  Administered 2019-12-12 – 2019-12-13 (×12): 200 mg via ORAL
  Filled 2019-12-11 (×15): qty 1

## 2019-12-11 MED ORDER — CLOZAPINE 25 MG PO TABS
25.0000 mg | ORAL_TABLET | Freq: Two times a day (BID) | ORAL | Status: DC
  Filled 2019-12-11 (×2): qty 1

## 2019-12-11 NOTE — ED Provider Notes (Signed)
[facility] Emergency Department Provider Note  ____________________________________________   First MD Initiated Contact with Patient 12/11/19 1522     (approximate)  I have reviewed the triage vital signs and the nursing notes.  History  Chief Complaint Fall and Altered Mental Status    HPI Sarah Donaldson is a 69 y.o. adult past medical history as below, including Parkinson's, who presents to the emergency department for recurrent falls.  Patient states since Thursday she has fallen greater than 10 times, husband states up to ~25.  She states she will be standing like normal, when all of a sudden she feels off balance and falls to the ground without warning. No associate syncope, she is alert and oriented throughout and knows that she has fall.  Yesterday this happened while walking in her kitchen, she fell and hit the back of her head against the refrigerator.  No loss of consciousness.  Not on any blood thinning medications.  She also states she feels like her speech sounds garbled and "drunk".  This is also been happening since Thursday. No focal or lateralizing weakness. Patient on a number of different medications by her neurologist help control her Parkinson's symptoms.   Past Medical Hx Past Medical History:  Diagnosis Date  . Cancer ([place])    Non-hodgkin's lymphoma (in remission)  . GERD (gastroesophageal reflux disease)    well-controlled w/ omeprazole  . Hyperlipidemia   . Knee joint cyst, left 02/07/2018  . Parkinson disease ([place])   . Parkinson disease [facility])     Problem List Patient Active Problem List   Diagnosis Date Noted  . S/P total knee replacement, left 06/07/2019  . Osteoarthritis of knee 04/27/2019  . Panic attacks 04/07/2019  . Psychosis ([place]) 04/04/2019  . Numbness 03/09/2018  . Lumbar spondylosis 03/07/2018  . Chronic pain of left knee 03/07/2018  . Chronic upper back pain 12/08/2017  . Benign neoplasm of hand 10/20/2017    . Chronic pain syndrome 10/20/2017  . Chronic pain of right lower extremity 10/20/2017  . Chronic myofascial pain 10/20/2017  . Numbness and tingling 07/12/2017  . Sleep difficulties 07/12/2017  . Low back pain 01/18/2017  . Sleep behavior disorder, REM 01/18/2017  . Lumbar radiculopathy 11/01/2016  . Follicular lymphoma of extranodal and solid organ sites [facility]) 05/01/2016  . Lymphoma ([place]) 04/30/2016  . Hallux limitus 07/27/2015  . Low back pain with sciatica 02/13/2015  . Bilateral low back pain with sciatica 02/13/2015  . H/O adenomatous polyp of colon 04/06/2014  . Hx of adenomatous colonic polyps 04/06/2014  . Back ache 01/25/2014  . Adiposity 01/25/2014  . REM sleep behavior disorder 01/25/2014  . Disordered sleep 01/25/2014  . Has a tremor 01/25/2014  . Obesity, unspecified 01/25/2014  . Sleep disorder 01/25/2014  . Backache 01/25/2014  . Tremor 01/25/2014  . Obesity 01/25/2014  . Difficulty hearing 06/08/2012  . Hearing loss 06/08/2012  . Anxiety disorder 06/06/2012  . Colon polyp 06/06/2012  . Clinical depression 06/06/2012  . Hypercholesteremia 06/06/2012  . Idiopathic Parkinson's disease ([place]) 06/06/2012  . Detached retina 06/06/2012  . Depressive disorder 06/06/2012  . Retinal detachment 06/06/2012  . Parkinson's disease ([place]) 06/06/2012    Past Surgical Hx Past Surgical History:  Procedure Laterality Date  . BACK SURGERY    . BUNIONECTOMY    . NASAL SEPTUM SURGERY    . RETINAL DETACHMENT SURGERY    . TOTAL KNEE ARTHROPLASTY Left 06/07/2019   Procedure: TOTAL KNEE ARTHROPLASTY;  Surgeon: Harlow Mares,  Elyn Aquas, MD;  Location: [facility] ORS;  Service: Orthopedics;  Laterality: Left;    Medications Prior to Admission medications   Medication Sig Start Date End Date Taking? Authorizing Provider  acetaminophen (TYLENOL) 650 MG CR tablet Take 1,300 mg by mouth every 8 (eight) hours as needed for pain.     [provider]  aspirin 81 MG chewable tablet Chew 1  tablet (81 mg total) by mouth 2 (two) times daily. 06/09/19   Carlynn Spry, PA-C  carbidopa-levodopa (SINEMET IR) 25-100 MG tablet Take 2 tablets by mouth 6 (six) times daily. Take 2 tablets q2 hours between 0800 and 2000.  Pt can take an extra 1-2 tabs at night as needed for tightness 11/08/18   [provider]  clonazePAM (KLONOPIN) 1 MG tablet Take 1 mg by mouth 4 (four) times daily.     [provider]  furosemide (LASIX) 20 MG tablet Take 20 mg by mouth daily as needed for fluid.  11/06/18   [provider]  HYDROcodone-acetaminophen (NORCO/VICODIN) 5-325 MG tablet Take 1-2 tablets by mouth every 4 (four) hours as needed (pain score 4-6). 06/09/19   Carlynn Spry, PA-C  methocarbamol (ROBAXIN) 500 MG tablet Take 1 tablet (500 mg total) by mouth every 6 (six) hours as needed for muscle spasms. 06/09/19   Carlynn Spry, PA-C  metoCLOPramide (REGLAN) 5 MG tablet Take 1 tablet (5 mg total) by mouth every 8 (eight) hours as needed for nausea (if ondansetron (ZOFRAN) ineffective.). 06/09/19   Carlynn Spry, PA-C  neomycin-polymyxin b-dexamethasone (MAXITROL) 3.5-10000-0.1 OINT Place 1 application into the left eye at bedtime.     [provider]  omeprazole (PRILOSEC) 20 MG capsule Take 1 capsule (20 mg total) by mouth daily. 03/07/18 11/22/24  Vevelyn Francois, NP  prednisoLONE acetate (PRED FORTE) 1 % ophthalmic suspension Place 1 drop into the left eye daily. 10/07/17   [provider]  pregabalin (LYRICA) 50 MG capsule BID to TID prn Patient taking differently: 50 mg 3 (three) times daily.  05/29/19   Gillis Santa, MD  QUEtiapine (SEROQUEL) 50 MG tablet Take 1 tablet (50 mg total) by mouth at bedtime. 04/04/19   Ursula Alert, MD  traMADol (ULTRAM) 50 MG tablet Take 1 tablet (50 mg total) by mouth every 6 (six) hours. 06/09/19   Carlynn Spry, PA-C  traZODone (DESYREL) 50 MG tablet Take 75 mg by mouth at bedtime.  10/04/18   [provider]     Allergies Baclofen, Codeine sulfate, Gabapentin, and Terazosin  Family Hx Family History  Problem Relation Age of Onset  . Heart disease Mother   . Alcohol abuse Father   . Breast cancer Neg Hx   . Mental illness Neg Hx     Social Hx Social History   Tobacco Use  . Smoking status: Former Research scientist (life sciences)  . Smokeless tobacco: Never Used  Substance Use Topics  . Alcohol use: Yes    Alcohol/week: 0.0 standard drinks  . Drug use: No     Review of Systems  Constitutional: Negative for fever. Negative for chills.  Positive for recurrent falls. Eyes: Negative for visual changes. ENT: Negative for sore throat. Cardiovascular: Negative for chest pain. Respiratory: Negative for shortness of breath. Gastrointestinal: Negative for nausea. Negative for vomiting.  Genitourinary: Negative for dysuria. Musculoskeletal: Negative for leg swelling. Skin: Negative for rash. Neurological: Negative for headaches.   Physical Exam  Vital Signs: ED Triage Vitals  Enc Vitals Group     BP 12/11/19 1517 Marland Kitchen)  142/113     Pulse Rate 12/11/19 1517 76     Resp 12/11/19 1517 16     Temp 12/11/19 1517 98.4 F (36.9 C)     Temp Source 12/11/19 1517 Oral     SpO2 12/11/19 1517 92 %     Weight 12/11/19 1519 155 lb (70.3 kg)     Height 12/11/19 1519 5\' 6"  (1.676 m)     Head Circumference --      Peak Flow --      Pain Score 12/11/19 1519 10     Pain Loc --      Pain Edu? --      Excl. in [place]? --     Constitutional: Alert and oriented. NAD.  Head: Normocephalic. Atraumatic. Eyes: Conjunctivae clear. Sclera anicteric.  Irregular LEFT pupil, chronic in the setting of prior surgery and now blind at baseline on this side. Nose: No masses or lesions. No congestion or rhinorrhea. Mouth/Throat: Wearing mask.  Neck: No stridor. Trachea midline.  No midline CS tenderness. Cardiovascular: Normal rate, regular rhythm. Extremities well perfused. Respiratory: Normal respiratory effort.  Lungs  CTAB. Gastrointestinal: Soft. Non-distended. Non-tender.  Genitourinary: Deferred. Musculoskeletal: No lower extremity edema. No deformities. Neurologic: Speech does sound slurred, but no aphasia or word finding difficulties. No gross focal or lateralizing neurologic deficits are appreciated.  Consistent with history of Parkinson's. Skin: Skin is warm, dry and intact. No rash noted. Psychiatric: Mood and affect are appropriate for situation.  EKG  Personally reviewed and interpreted by myself.   Date: 12/16/2019 Time: 1518 Rate: 77 Rhythm: Sinus Axis: Normal Intervals: Within normal limits TWI III, aVF No STEMI    Radiology  Personally reviewed available imaging myself.   MRI brain - IMPRESSION:  1. Thin subdural hematoma over the right cerebral convexity and falx  without significant mass effect.  2. Otherwise unremarkable appearance of the brain for age.   CT head/CS - IMPRESSION:  1. Unremarkable CT evaluation of the head.  2. No evidence for acute fracture or subluxation of the cervical  spine.  3. Multilevel degenerative changes of the cervical spine.    Procedures  Procedure(s) performed (including critical care):  Procedures   Initial Impression / Assessment and Plan / MDM / ED Course  69 y.o. adult who presents to the ED for recurrent falls, slurred speech.  History of Parkinson's. Husband at bedside says she has fallen at least 20 times since Thursday.   Ddx: progression of Parkinson's - posture/equilibirum impairment , intracranial etiology, CVA,  polypharmacy/medication related (patient is taking Seroquel, trazodone, Lyrica, clonazepam, Lexapro)   Will plan for labs, imaging  Clinical Course as of Dec 10 1836  Mon Dec 11, 2019  1600 CT w/o acute findings. Will follow up with MRI. Labs thus far unremarkable. Awaiting urine.   [SM]  1818 MRI with thin subdural hematoma over the right cerebral convexity and falx without significant mass effect. Will  discuss with NSGY. Takes baby ASA, no other thinners.   [SM]  1822 Discussed case with Dr. Cari Caraway of neurosurgery, patient should be appropriate for admission here w/ regards to the SDH.  Recommended also obtaining MRI C-spine given her recurrent falls to evaluate for possible myelopathy. Reassured that her neurological exam has not changed acutely in the last 24 hours from the fall that presumably caused the SDH. However, concern regarding her unsteadiness, excessive and frequent falls (with now proven related injuries) - will plan to admit. Patient and family agreeable.    [SM]  Clinical Course User Index [SM] Lilia Pro., MD     _______________________________   As part of my medical decision making I have reviewed available labs, radiology tests, reviewed old records/performed chart review, obtained additional history from family (husband at bedside), and discussed with consultants (NSGY, Dr. Cari Caraway).     Final Clinical Impression(s) / ED Diagnosis  Final diagnoses:  Fall, initial encounter  Subdural hematoma ([place])  Frequent falls  Unsteadiness       Note:  This document was prepared using Dragon voice recognition software and may include unintentional dictation errors.   Lilia Pro., MD 12/11/19 [phone]

## 2019-12-11 NOTE — ED Notes (Signed)
Pt given sandwich tray and cola  

## 2019-12-11 NOTE — H&P (Signed)
History and Physical    Sarah Donaldson [id number] DOB: [birth year] DOA: 12/11/2019  PCP: Cletis Athens, MD (Confirm with patient/family/NH records and if not entered, this has to be entered at [facility] point of entry) Patient coming from: home  I have personally briefly reviewed patient's old medical records in [place]  Chief Complaint: multiple falls, very unsteady on her feet  HPI: Sarah Donaldson is a 68 y.o. adult with medical history significant of parkinson's, HLD, GERD,  presents to the emergency department for recurrent falls.  Patient states since Thursday she has fallen greater than 10 times, husband states up to ~25.  She states she will be standing like normal, when all of a sudden she feels off balance and falls to the ground without warning. No associate syncope, she is alert and oriented throughout and knows that she has fall.  Yesterday this happened while walking in her kitchen, she fell and hit the back of her head against the refrigerator.  No loss of consciousness.  Not on any blood thinning medications.  She also states she feels like her speech sounds garbled and "drunk".  This is also been happening since Thursday. No focal or lateralizing weakness. Patient on a number of different medications by her neurologist help control her Parkinson's symptoms.   Mr. Stuller reports that she has been chronically dehydrated, possibly medication related. She had previously had perioral paresthesia and confusion with seroquel leading to cessation. Due to paranoid psychosis with hallucinations seroquel was reinstituted and has been titrated up to low dose tid. With increasing falls and imbalance she was seen by Dr. Melrose Nakayama, attending neurologist 12/07/19. The primary focus was her dementia and hallucinosis. Mr. Coy Saunas reports her symptoms were worse with increased seroquel and he stopped giving it to her Saturday, 12/09/19.   ED Course: Hemodynamically stable. Non-focal neuro exam per EDP.  Labs unremarkable. Imaging significant for MRI brain revealing thin 60mm SDH over falyx. TRH consulted to admit patient 2/2 multiple falls, SHD and very high risk of injury at home with potential need for neuro consult and short-tern SNF/Rehab.   Review of Systems: As per HPI otherwise 10 point review of systems negative.    Past Medical History:  Diagnosis Date  . Cancer ([place])    Non-hodgkin's lymphoma (in remission)  . GERD (gastroesophageal reflux disease)    well-controlled w/ omeprazole  . Hyperlipidemia   . Knee joint cyst, left 02/07/2018  . Parkinson disease ([place])   . Parkinson disease [facility])     Past Surgical History:  Procedure Laterality Date  . BACK SURGERY    . BUNIONECTOMY    . NASAL SEPTUM SURGERY    . RETINAL DETACHMENT SURGERY    . TOTAL KNEE ARTHROPLASTY Left 06/07/2019   Procedure: TOTAL KNEE ARTHROPLASTY;  Surgeon: Lovell Sheehan, MD;  Location: [facility] ORS;  Service: Orthopedics;  Laterality: Left;   Social Hx - married 48 years. She has 3 sons, 1 daughter. She has 7 grandchildren with #8 due soon. 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    reports that she has quit smoking. She has never used smokeless tobacco. She reports current alcohol use. She reports that she does not use drugs.  Allergies  Allergen Reactions  . Baclofen     Caused numbness in mouth   . Codeine Sulfate Nausea And Vomiting  . Gabapentin Swelling    Mouth swelling   . Terazosin     Family History  Problem Relation Age of Onset  .  Heart disease Mother   . Alcohol abuse Father   . Breast cancer Neg Hx   . Mental illness Neg Hx      Prior to Admission medications   Medication Sig Start Date End Date Taking? Authorizing Provider  acetaminophen (TYLENOL) 650 MG CR tablet Take 1,300 mg by mouth every 8 (eight) hours as needed for pain.     [provider]  aspirin 81 MG chewable tablet Chew 1 tablet (81 mg total) by mouth 2 (two) times daily. 06/09/19   Carlynn Spry, PA-C    carbidopa-levodopa (SINEMET IR) 25-100 MG tablet Take 2 tablets by mouth 6 (six) times daily. Take 2 tablets q2 hours between 0800 and 2000.  Pt can take an extra 1-2 tabs at night as needed for tightness 11/08/18   [provider]  clonazePAM (KLONOPIN) 1 MG tablet Take 1 mg by mouth 4 (four) times daily.     [provider]  furosemide (LASIX) 20 MG tablet Take 20 mg by mouth daily as needed for fluid.  11/06/18   [provider]  HYDROcodone-acetaminophen (NORCO/VICODIN) 5-325 MG tablet Take 1-2 tablets by mouth every 4 (four) hours as needed (pain score 4-6). 06/09/19   Carlynn Spry, PA-C  methocarbamol (ROBAXIN) 500 MG tablet Take 1 tablet (500 mg total) by mouth every 6 (six) hours as needed for muscle spasms. 06/09/19   Carlynn Spry, PA-C  metoCLOPramide (REGLAN) 5 MG tablet Take 1 tablet (5 mg total) by mouth every 8 (eight) hours as needed for nausea (if ondansetron (ZOFRAN) ineffective.). 06/09/19   Carlynn Spry, PA-C  neomycin-polymyxin b-dexamethasone (MAXITROL) 3.5-10000-0.1 OINT Place 1 application into the left eye at bedtime.     [provider]  omeprazole (PRILOSEC) 20 MG capsule Take 1 capsule (20 mg total) by mouth daily. 03/07/18 11/22/24  Vevelyn Francois, NP  prednisoLONE acetate (PRED FORTE) 1 % ophthalmic suspension Place 1 drop into the left eye daily. 10/07/17   [provider]  pregabalin (LYRICA) 50 MG capsule BID to TID prn Patient taking differently: 50 mg 3 (three) times daily.  05/29/19   Gillis Santa, MD  QUEtiapine (SEROQUEL) 50 MG tablet Take 1 tablet (50 mg total) by mouth at bedtime. 04/04/19   Ursula Alert, MD  traMADol (ULTRAM) 50 MG tablet Take 1 tablet (50 mg total) by mouth every 6 (six) hours. 06/09/19   Carlynn Spry, PA-C  traZODone (DESYREL) 50 MG tablet Take 75 mg by mouth at bedtime.  10/04/18   [provider]    Physical Exam: Vitals:   12/11/19 1517 12/11/19 1519 12/11/19 1530 12/11/19  1710  BP: (!) 142/113  111/66 115/71  Pulse: 76  76 72  Resp: 16  20 16   Temp: 98.4 F (36.9 C)     TempSrc: Oral     SpO2: 92%  99% 97%  Weight:  70.3 kg    Height:  5\' 6"  (1.676 m)      Constitutional: NAD, calm, comfortable Vitals:   12/11/19 1517 12/11/19 1519 12/11/19 1530 12/11/19 1710  BP: (!) 142/113  111/66 115/71  Pulse: 76  76 72  Resp: 16  20 16   Temp: 98.4 F (36.9 C)     TempSrc: Oral     SpO2: 92%  99% 97%  Weight:  70.3 kg    Height:  5\' 6"  (1.676 m)     General:  WNWD woman in no distress, with slurred speech. Eyes: Right pupil normal and reactive, left pupil abnormal  2/2 cataract surgery, lids normal, left bulbar conjunctivae with lateral erythema ENMT: Mucous membranes are dry, tongue furred. Posterior pharynx clear of any exudate or lesions.Normal dentition.  Neck: normal, supple, no masses, no thyromegaly Respiratory: clear to auscultation bilaterally, no wheezing, no crackles. Normal respiratory effort. No accessory muscle use.  Cardiovascular: Regular rate and rhythm, no murmurs / rubs / gallops. No extremity edema. 2+ pedal pulses. No carotid bruits.  Abdomen: no tenderness, no masses palpated. No hepatosplenomegaly. Bowel sounds positive.  Musculoskeletal: no clubbing / cyanosis. DI/PIP joint deformities upper. Left knee with scar from [place]. Good ROM, no contractures. Normal muscle tone.  Skin: no rashes, multiple small abrasions and scratches, mild bruising. No induration Neurologic: CN 2-12 grossly intact. Sensation intact Strength 5/5 in all 4. No resting tremor, no cogwheeling rigidity, no pipe-stem regidity Psychiatric: Normal judgment and insight but no formal cognitive testing done.  Alert and oriented x 3. Slurred speech    Labs on Admission: I have personally reviewed following labs and imaging studies  CBC: Recent Labs  Lab 12/11/19 1540  WBC 5.4  NEUTROABS 3.5  HGB 12.6  HCT 38.7  MCV 88.0  PLT [id number]   Basic Metabolic  Panel: Recent Labs  Lab 12/11/19 1540  NA 144  K 3.5  CL 111  CO2 26  GLUCOSE 98  BUN 30*  CREATININE 0.48  CALCIUM 8.6*   GFR: Estimated Creatinine Clearance (by C-G formula based on SCr of 0.48 mg/dL) Female: 60.4 mL/min Female: 76.4 mL/min Liver Function Tests: Recent Labs  Lab 12/11/19 1540  AST 32  ALT 6  ALKPHOS 86  BILITOT 1.2  PROT 6.5  ALBUMIN 4.1   No results for input(s): LIPASE, AMYLASE in the last 168 hours. No results for input(s): AMMONIA in the last 168 hours. Coagulation Profile: No results for input(s): INR, PROTIME in the last 168 hours. Cardiac Enzymes: No results for input(s): CKTOTAL, CKMB, CKMBINDEX, TROPONINI in the last 168 hours. BNP (last 3 results) No results for input(s): PROBNP in the last 8760 hours. HbA1C: No results for input(s): HGBA1C in the last 72 hours. CBG: No results for input(s): GLUCAP in the last 168 hours. Lipid Profile: No results for input(s): CHOL, HDL, LDLCALC, TRIG, CHOLHDL, LDLDIRECT in the last 72 hours. Thyroid Function Tests: No results for input(s): TSH, T4TOTAL, FREET4, T3FREE, THYROIDAB in the last 72 hours. Anemia Panel: No results for input(s): VITAMINB12, FOLATE, FERRITIN, TIBC, IRON, RETICCTPCT in the last 72 hours. Urine analysis:    Component Value Date/Time   COLORURINE YELLOW (A) 09/24/2019 2341   APPEARANCEUR CLEAR (A) 09/24/2019 2341   LABSPEC 1.028 09/24/2019 2341   PHURINE 5.0 09/24/2019 2341   GLUCOSEU NEGATIVE 09/24/2019 2341   HGBUR NEGATIVE 09/24/2019 2341   BILIRUBINUR NEGATIVE 09/24/2019 2341   KETONESUR 5 (A) 09/24/2019 2341   PROTEINUR NEGATIVE 09/24/2019 2341   NITRITE NEGATIVE 09/24/2019 2341   LEUKOCYTESUR TRACE (A) 09/24/2019 2341    Radiological Exams on Admission: CT Head Wo Contrast  Result Date: 12/11/2019 CLINICAL DATA:  Pain status post fall EXAM: CT HEAD WITHOUT CONTRAST CT CERVICAL SPINE WITHOUT CONTRAST TECHNIQUE: Multidetector CT imaging of the head and cervical  spine was performed following the standard protocol without intravenous contrast. Multiplanar CT image reconstructions of the cervical spine were also generated. COMPARISON:  None recent FINDINGS: CT HEAD FINDINGS Brain: No evidence of acute infarction, hemorrhage, hydrocephalus, extra-axial collection or mass lesion/mass effect. Vascular: No hyperdense vessel or unexpected calcification. Skull: Normal. Negative for fracture or focal  lesion. Sinuses/Orbits: No acute finding. Other: None. CT CERVICAL SPINE FINDINGS Alignment: Normal. Skull base and vertebrae: No acute fracture. No primary bone lesion or focal pathologic process. Soft tissues and spinal canal: No prevertebral fluid or swelling. No visible canal hematoma. Disc levels: Multilevel degenerative changes are noted throughout the cervical spine. Upper chest: Negative. Other: None IMPRESSION: 1. Unremarkable CT evaluation of the head. 2. No evidence for acute fracture or subluxation of the cervical spine. 3. Multilevel degenerative changes of the cervical spine. Electronically Signed   By: Constance Holster M.D.   On: 12/11/2019 15:59   CT Cervical Spine Wo Contrast  Result Date: 12/11/2019 CLINICAL DATA:  Pain status post fall EXAM: CT HEAD WITHOUT CONTRAST CT CERVICAL SPINE WITHOUT CONTRAST TECHNIQUE: Multidetector CT imaging of the head and cervical spine was performed following the standard protocol without intravenous contrast. Multiplanar CT image reconstructions of the cervical spine were also generated. COMPARISON:  None recent FINDINGS: CT HEAD FINDINGS Brain: No evidence of acute infarction, hemorrhage, hydrocephalus, extra-axial collection or mass lesion/mass effect. Vascular: No hyperdense vessel or unexpected calcification. Skull: Normal. Negative for fracture or focal lesion. Sinuses/Orbits: No acute finding. Other: None. CT CERVICAL SPINE FINDINGS Alignment: Normal. Skull base and vertebrae: No acute fracture. No primary bone lesion or  focal pathologic process. Soft tissues and spinal canal: No prevertebral fluid or swelling. No visible canal hematoma. Disc levels: Multilevel degenerative changes are noted throughout the cervical spine. Upper chest: Negative. Other: None IMPRESSION: 1. Unremarkable CT evaluation of the head. 2. No evidence for acute fracture or subluxation of the cervical spine. 3. Multilevel degenerative changes of the cervical spine. Electronically Signed   By: Constance Holster M.D.   On: 12/11/2019 15:59   MR Brain Wo Contrast (neuro protocol)  Result Date: 12/11/2019 CLINICAL DATA:  Increasing frequency of falls with slurred speech. EXAM: MRI HEAD WITHOUT CONTRAST TECHNIQUE: Multiplanar, multiecho pulse sequences of the brain and surrounding structures were obtained without intravenous contrast. COMPARISON:  Head CT 12/11/2019 and MRI 03/07/2009 FINDINGS: Brain: A thin subdural hematoma over the right cerebral convexity and along the posterior aspect of the falx measures up to 3 mm in thickness, and there is no significant associated mass effect. No hyperdense blood products are evident on today's CT, and this hematoma may be nonacute. No acute infarct, mass, or midline shift is present. No significant white matter disease is seen for age. The ventricles are normal in size. Vascular: Major intracranial vascular flow voids are preserved. Skull and upper cervical spine: No suspicious marrow lesion. Sinuses/Orbits: Left cataract extraction. Minimal left maxillary sinus mucosal thickening. Clear mastoid air cells. Other: None. IMPRESSION: 1. Thin subdural hematoma over the right cerebral convexity and falx without significant mass effect. 2. Otherwise unremarkable appearance of the brain for age. Critical Value/emergent results were called by telephone at the time of interpretation on 12/11/2019 at 6:09 pm to Dr. Derrell Lolling , who verbally acknowledged these results. Electronically Signed   By: Logan Bores M.D.   On:  12/11/2019 18:09   MR Cervical Spine Wo Contrast  Result Date: 12/11/2019 CLINICAL DATA:  Patient's fall along greater than 10 times. History of Parkinson's disease. EXAM: MRI CERVICAL SPINE WITHOUT CONTRAST TECHNIQUE: Multiplanar, multisequence MR imaging of the cervical spine was performed. No intravenous contrast was administered. COMPARISON:  None. FINDINGS: Alignment: Physiologic. Vertebrae: No fracture, evidence of discitis, or bone lesion. Cord: Normal signal and morphology. Posterior Fossa, vertebral arteries, paraspinal tissues: Posterior fossa demonstrates no focal abnormality. Vertebral artery  flow voids are maintained. Paraspinal soft tissues are unremarkable. Disc levels: Discs: Degenerative disease with disc height loss C5-6, C6-7, C7-T1, T1-2 and T2-3. C2-3: No significant disc bulge. Severe left facet arthropathy. Mild left foraminal stenosis. No central canal stenosis. C3-4: Mild broad-based disc bulge. Severe bilateral facet arthropathy. Bilateral uncovertebral degenerative changes. Severe bilateral foraminal stenosis. No central canal stenosis. C4-5: Minimal broad-based disc bulge. Moderate bilateral facet arthropathy. Bilateral uncovertebral degenerative changes. Severe left and moderate-severe right foraminal stenosis. No central canal stenosis. C5-6: Broad-based disc bulge. Moderate bilateral facet arthropathy with bilateral uncovertebral degenerative changes. Severe left foraminal stenosis. Moderate right foraminal stenosis. No central canal stenosis. C6-7: Broad-based disc osteophyte complex. Left uncovertebral degenerative changes. Severe left foraminal stenosis. No right foraminal stenosis. No central canal stenosis. C7-T1: Broad-based disc bulge. Mild bilateral facet arthropathy. No neural foraminal stenosis. No central canal stenosis. T1-2: Mild broad-based disc bulge. Moderate left foraminal stenosis. No right foraminal stenosis. IMPRESSION: 1. At C3-4 there is a mild broad-based disc  bulge. Severe bilateral facet arthropathy. Bilateral uncovertebral degenerative changes. Severe bilateral foraminal stenosis. 2. At C4-5 there is a minimal broad-based disc bulge. Moderate bilateral facet arthropathy. Bilateral uncovertebral degenerative changes. Severe left and moderate-severe right foraminal stenosis. 3. At C5-6 there is a broad-based disc bulge. Moderate bilateral facet arthropathy with bilateral uncovertebral degenerative changes. Severe left foraminal stenosis. Moderate right foraminal stenosis. 4. At C6-7 there is a broad-based disc osteophyte complex. Left uncovertebral degenerative changes. Severe left foraminal stenosis. She Electronically Signed   By: Kathreen Devoid   On: 12/11/2019 19:51    EKG: Independently reviewed. NSR w/o acute changes.  Assessment/Plan Active Problems:   Falls frequently   Anxiety disorder   Idiopathic Parkinson's disease ([place])   Detached retina   Chronic myofascial pain   Psychosis ([facility])   SDH (subdural hematoma) ([facility])  (please populate well all problems here in Problem List. (For example, if patient is on BP meds at home and you resume or decide to hold them, it is a problem that needs to be her. Same for CAD, COPD, HLD and so on)   1. Frequent falls with injury - rather acute onset over the past week. The patient's husband associates this change with restarting Seroquel - which can cause imbalance, orthostatic hypotension. Other medications that may contribute to her symptoms include zanaflex, klonopin Plan Med-surg admit  PT/OT eval  Stop Seroquel (stopped Saturday by husband), stop klonopin, reduce prn Zanaflex to 2 mg    Tid.   Hydrate - D5 1/2NS @ 100 cc/hr x 12 hours and reassess  Orthostatic vitals q shift  2. SDH - small SDH unlikely to be symptomatic. Patient is not on blood thinner and will use SCDs Plan For neuro changes f/u MRI  3. Parkinson's disease -  Plan Continue current medications except as noted in #1  May need neuro  consult - Dr. Melrose Nakayama or associates at [place] [facility]  4. Chronic myofascial pain - will continue home meds except as noted in #1  5. Psychiatric - Patient with Parkinson's demenita with hallucinations and paranoia. She has been intolerant of seroquel Plan Clozaril 25mg  bid, then titrate up to 50 mg bid  Aricept 5 mg daily  Haloperidol 1mg  po q 4 prn severe agitation  May need psychiatric consult  6. Sleep disorder - stopping klonipin as possible cause of orthostatic hypotension Plan Melatonin 5 mg qHS  7. Disposition - may need short term SNF/Rehab based on evaluations above. TOC consult placed  8.  EoL care - patient has living will: DNR/DNI, currently OK for artificial nutrition and hydration Plan  Spoke with husband about the value of MOST form.  DVT prophylaxis: SCDs  Code Status: DNR/DNI  Family Communication: Mr. Mcmahill present at exam. Understands dx and tx plan and agrees. Disposition Plan: TBD  Consults called: none  Admission status: inpatient    Adella Hare MD [facility] Pager [phone]  If 7PM-7AM, please contact night-coverage [email or web address] Password [facility]  12/11/2019, 8:36 PM

## 2019-12-11 NOTE — ED Notes (Signed)
First Nurse Note: Pt to ED with husband who states that pts medication was recently changed by neurologist. Pt has fall 25 times since Thursday. Pt has slurred speech, states that she is dizzy. Pt is having trouble holding her head up and appears to be altered. Charge RN called for a room and pt was taken to room 18 to be triaged.

## 2019-12-11 NOTE — Consult Note (Signed)
Full note to follow.  This patient is well known to me - I performed spine surgery on her in the past.  Per discussion with Dr. Joan Mayans, Sarah Donaldson presented with increasing falls, and was brought to the ER feeling off balance.  She has also had change in her speech.  She has hit her head, but no LOC.  I have reviewed the imaging, which shows a very small R subdural hemat that is T1 bright and T2 bright, most consistent with subacute (23-75 weeks of age) collection.    Based on this imaging finding, she should be admitted for observation.  I do not think further imaging is indicated unless she has a change in status.  - No AEDs due to risk for additional AMS - Would consider neurology consultation given known Parkinsons Disease, as the C spine MRI and Brain MRI do not explain her falls - No surgical intervention necessary.  Meade Maw MD

## 2019-12-11 NOTE — ED Notes (Signed)
Pt transported to MRI 

## 2019-12-11 NOTE — ED Triage Notes (Signed)
Pt here from home with c/o falling 22+ times since Thursday, husband states she hit her head last night on the corner of the refrigerator, denies blood thinner. Pt has parkinson's, but husband states the falls have gotten considerably worse since Thursday. Pt is answering all questions appropriately, however, speech is garbled-states "I feel very drunk." Husband states she hasn't been drinking water like she is supposed to, oxygen has been in the low 90's the past month, pt states she has been feeling nauseated since last night. Pt started acting "drunk" after waking up this am. NAD. Husband at bedside.

## 2019-12-11 NOTE — ED Notes (Signed)
Husband called out and asked if pt can have her 4:00pm medications and that he brought them with him. Dr. Joan Mayans, MD made aware and approved pt having her meds. Husband made aware.

## 2019-12-12 ENCOUNTER — Encounter: Payer: Self-pay | Admitting: Internal Medicine

## 2019-12-12 DIAGNOSIS — M7918 Myalgia, other site: Secondary | ICD-10-CM

## 2019-12-12 DIAGNOSIS — W19XXXA Unspecified fall, initial encounter: Secondary | ICD-10-CM

## 2019-12-12 DIAGNOSIS — G8929 Other chronic pain: Secondary | ICD-10-CM

## 2019-12-12 DIAGNOSIS — S065X9A Traumatic subdural hemorrhage with loss of consciousness of unspecified duration, initial encounter: Secondary | ICD-10-CM

## 2019-12-12 LAB — URINALYSIS, COMPLETE (UACMP) WITH MICROSCOPIC
Bacteria, UA: NONE SEEN
Specific Gravity, Urine: 1.037 — ABNORMAL HIGH (ref 1.005–1.030)

## 2019-12-12 NOTE — Evaluation (Signed)
Occupational Therapy Evaluation Patient Details Name: Sarah Donaldson MRN: [id number] DOB: 22-Aug-1950 Today's Date: 12/12/2019    History of Present Illness Pt admitted for subdural hematoma. Pt with complaints of multiple falls (>25 since last week) and slurred speech. Also complains of dizziness, but orthostatics negative. History includes Parkinsons, HLD, and GERD.   Clinical Impression   Hanora Scruton was seen for OT evaluation this date. Prior to hospital admission, pt was reciving assistance from her spouse for bathing and dressing, using a RW, WC, or power scooter for functional mobility. Pt lives in a 1 level house with 3 steps to enter and R handrail, with spouse available to provide 24/7 assist. Currently pt demonstrates impairments as described below (See OT problem list) which functionally limit their ability to perform ADL/self-care tasks. Pt currently requires close CGA for functional mobility and standing grooming tasks at sink this date as well as MIN A for LB ADL management including bathing and dressing in a seated position. Pt educated on falls prevention strategies, safe use of AE/DME for ADL management, and routines modifications to support safety and fxl independence upon hospital DC. Pt return verbalizes understanding of education provided, and would benefit benefit from fruther skilled OT to address noted impairments and functional limitations (see below for any additional details) in order to maximize safety and independence while minimizing falls risk and caregiver burden. Upon hospital discharge, recommend HHOT to maximize pt safety and return to functional independence during meaningful occupations of daily life.      Follow Up Recommendations  Home health OT;Supervision - Intermittent    Equipment Recommendations  3 in 1 bedside commode    Recommendations for Other Services       Precautions / Restrictions Precautions Precautions: Fall Restrictions Weight  Bearing Restrictions: No      Mobility Bed Mobility Overal bed mobility: Needs Assistance Bed Mobility: Sit to Supine     Supine to sit: Min guard Sit to supine: Modified independent (Device/Increase time)   General bed mobility comments: ease of transfer despite having severe tremors. Tremors appear to improve with movement and worsen at rest  Transfers Overall transfer level: Needs assistance Equipment used: Rolling walker (2 wheeled) Transfers: Sit to/from Stand Sit to Stand: Min guard         General transfer comment: safe technique with upright posture.     Balance Overall balance assessment: Needs assistance;History of Falls Sitting-balance support: Feet supported;Single extremity supported Sitting balance-Leahy Scale: Good Sitting balance - Comments: Steady static/dynamic stting. No LOB appreciated during weight shift for functional tasks including peri-care this date.   Standing balance support: During functional activity;Single extremity supported;Bilateral upper extremity supported Standing balance-Leahy Scale: Fair Standing balance comment: Pt noted to generally maintain at least 1 UE for support including during functional tasks such as oral care and grooming.                           ADL either performed or assessed with clinical judgement   ADL Overall ADL's : Needs assistance/impaired     Grooming: Min guard;Wash/dry face;Oral care;Standing Grooming Details (indicate cue type and reason): Pt uses RW to stand at sink for grooming tasks. Close min guard provided for safety. Upper Body Bathing: Set up;Supervision/ safety;Sitting   Lower Body Bathing: Minimal assistance;With adaptive equipment;Cueing for sequencing;Cueing for safety;Sitting/lateral leans   Upper Body Dressing : Set up;Supervision/safety;Sitting   Lower Body Dressing: Minimal assistance;Sit to/from stand;Cueing for safety;With adaptive equipment;Cueing for  sequencing   Toilet  Transfer: BSC;Min guard;RW;Ambulation   Toileting- Clothing Manipulation and Hygiene: Sit to/from stand;Min guard       Functional mobility during ADLs: Min guard;Rolling walker;Cueing for safety;Cueing for sequencing       Vision Baseline Vision/History: Wears glasses(Contact in R eye only) Wears Glasses: At all times Patient Visual Report: No change from baseline       Perception     Praxis      Pertinent Vitals/Pain Pain Assessment: 0-10 Pain Score: 5  Pain Location: Low back as well as generalized pain from falls. Pain Descriptors / Indicators: Sore;Guarding Pain Intervention(s): Limited activity within patient's tolerance;Monitored during session;Repositioned     Hand Dominance Right   Extremity/Trunk Assessment Upper Extremity Assessment Upper Extremity Assessment: Generalized weakness(Grossly 3+/5 t/o. Grip and [place] decreased but WFLs.)   Lower Extremity Assessment Lower Extremity Assessment: Generalized weakness       Communication Communication Communication: (Speech notably slurred. Pt states this is normal)   Cognition Arousal/Alertness: Awake/alert Behavior During Therapy: WFL for tasks assessed/performed Overall Cognitive Status: Within Functional Limits for tasks assessed                                     General Comments       Exercises Exercises: Other exercises Other Exercises Other Exercises: Pt able to ambulated over to [facility]. Follows commands well for sequencing. Is limited due to tremors (pt reports she takes meds Q2 Hr for symptoms). Able to perform hygiene safely. Does need min assist for balance during standing. Other Exercises: OT engages pt in functional tasks including toileting, oral care, hand hygiene, and face washing. Pt educated in safe transfer techniques and falls prevention strategies throughout. See ADL section for additional information regarding occupational performance.   Shoulder Instructions      Home  Living Family/patient expects to be discharged to:: Private residence Living Arrangements: Spouse/significant other Available Help at Discharge: Family Type of Home: House Home Access: Stairs to enter Technical brewer of Steps: 1 Entrance Stairs-Rails: Right Home Layout: One level     Bathroom Shower/Tub: Occupational psychologist: [place]: Grab bars - tub/shower;Grab bars - toilet;Walker - 2 wheels;Electric scooter;Wheelchair - manual;Bedside commode          Prior Functioning/Environment Level of Independence: Needs assistance  Gait / Transfers Assistance Needed: reports she was using RW primarily, however has been using WC for long distances. Has electric scooter, however battery dead ADL's / Homemaking Assistance Needed: Pt reports husband helps with bathing and dressing            OT Problem List: Decreased strength;Decreased coordination;Decreased activity tolerance;Decreased safety awareness;Impaired balance (sitting and/or standing);Decreased knowledge of use of DME or AE;Impaired vision/perception      OT Treatment/Interventions: Self-care/ADL training;Therapeutic exercise;Therapeutic activities;DME and/or AE instruction;Patient/family education;Balance training;Neuromuscular education;Energy conservation    OT Goals(Current goals can be found in the care plan section) Acute Rehab OT Goals Patient Stated Goal: to get stronger OT Goal Formulation: With patient Time For Goal Achievement: 12/26/19 Potential to Achieve Goals: Good ADL Goals Pt Will Perform Grooming: sitting;with set-up;with supervision;with caregiver independent in assisting Pt Will Perform Upper Body Dressing: with set-up;with supervision;sitting;with caregiver independent in assisting Pt Will Perform Lower Body Dressing: sit to/from stand;with set-up;with supervision;with caregiver independent in assisting;with adaptive equipment(LRAD PRN for improved safety and fxl  independence.) Pt Will Transfer to  Toilet: ambulating;bedside commode;with supervision;with set-up(LRAD PRN for improved safety and fxl independence.)  OT Frequency: Min 1X/week   Barriers to D/C:            Co-evaluation              AM-PAC OT "6 Clicks" Daily Activity     Outcome Measure Help from another person eating meals?: None Help from another person taking care of personal grooming?: A Little Help from another person toileting, which includes using toliet, bedpan, or urinal?: A Little Help from another person bathing (including washing, rinsing, drying)?: A Little Help from another person to put on and taking off regular upper body clothing?: A Little Help from another person to put on and taking off regular lower body clothing?: A Little 6 Click Score: 19   End of Session Equipment Utilized During Treatment: Gait belt;Rolling walker Nurse Communication: Mobility status  Activity Tolerance: Patient tolerated treatment well Patient left: in bed;with call bell/phone within reach;with bed alarm set  OT Visit Diagnosis: Repeated falls (R29.6);Other abnormalities of gait and mobility (R26.89);Muscle weakness (generalized) (M62.81)                Time: [id number] OT Time Calculation (min): 20 min Charges:  OT General Charges $OT Visit: 1 Visit OT Evaluation $OT Eval Moderate Complexity: 1 Mod OT Treatments $Self Care/Home Management : 8-22 mins  Shara Blazing, M.S., OTR/L Ascom: [phone] 12/12/19, 12:53 PM

## 2019-12-12 NOTE — Progress Notes (Signed)
Progress Note    Sarah Donaldson  [id number] DOB: 18-Apr-1951  DOA: 12/11/2019 PCP: Cletis Athens, MD      Brief Narrative:    Medical records reviewed and are as summarized below:  Sarah Donaldson is an 69 y.o. adult with medical history significant of parkinson's, HLD, GERD, presents to the emergency department for recurrent falls. Patient said since Thursday (12/07/2019) she has fallen greater than 10 times, husband states up to ~25. She states she willbe standing like normal, when all of a sudden she feels off balance and falls to the groundwithout warning.No associated syncope.  The day prior to admission, she fell in her kitchen and hit the back of her head against a refrigerator.  Work-up in the hospital revealed small right subdural hematoma.  She was on aspirin and diclofenac but these have been discontinued.  She was seen in consultation by neurosurgeon who recommended conservative management and no surgery was warranted.  She has also been evaluated by neurologist.  PT and OT recommend discharge to SNF.      Assessment/Plan:   Active Problems:   Anxiety disorder   Idiopathic Parkinson's disease (HCC)   Detached retina   Chronic myofascial pain   Psychosis (HCC)   SDH (subdural hematoma) (HCC)   Falls frequently   1. Frequent falls with injury - rather acute onset over the past week. The patient's husband associates this change with restarting Seroquel - which can cause imbalance, orthostatic hypotension. Other medications that may contribute to her symptoms include zanaflex, klonopin Plan     Med-surg admit             PT/OT eval             Seroquel and Klonopin have been discontinued.  Zanaflex has been decreased to to 2 mg 3 times daily (from 4 mg 3 times daily)                         Tid.                2. SDH - small SDH.  Patient has been evaluated by neurosurgeon and neurologist.  No need for surgery or antiepileptics.  Conservative management.   Aspirin and diclofenac have been discontinued.  This was discussed with the patient.  3. Parkinson's disease -  Plan     Continue antiparkinsonian medications   4. Chronic myofascial pain -continue analgesics.  5. Psychiatric - Patient with Parkinson's demenita with hallucinations and paranoia. She has been intolerant of seroquel Plan     Clozaril 25mg  bid, then titrate up to 50 mg bid             Aricept 5 mg daily             Haloperidol 1mg  po q 4 prn severe agitation         6. Sleep disorder - stopping klonipin as possible cause of orthostatic hypotension Plan     Melatonin 5 mg qHS     Body mass index is 25.02 kg/m.   Family Communication/Anticipated D/C date and plan/Code Status   DVT prophylaxis: SCDs Code Status: Full code Family Communication: Plan discussed with patient Disposition Plan:    Status is: Inpatient  Remains inpatient appropriate because:Unsafe d/c plan   Dispo: The patient is from: Home              Anticipated d/c is to: SNF  Anticipated d/c date is: 1 day              Patient currently is not medically stable to d/c.  Patient has had recurrent falls at home.  PT recommends placement to SNF which needs to be arranged prior to discharge.           Subjective:   No complaints.  No headache or dizziness.  Objective:    Vitals:   12/11/19 2223 12/12/19 0351 12/12/19 0816 12/12/19 1527  BP: 130/76 (!) 147/84 139/80 113/68  Pulse: 69 65 69 78  Resp:  17  18  Temp:  98.1 F (36.7 C) 97.7 F (36.5 C) 98.3 F (36.8 C)  TempSrc:   Oral Oral  SpO2:  97% 96%   Weight:      Height:       Orthostatic VS for the past 24 hrs:  BP- Lying Pulse- Lying BP- Sitting Pulse- Sitting BP- Standing at 0 minutes Pulse- Standing at 0 minutes  12/11/19 2223 130/76 69 121/84 73 125/74 75     Intake/Output Summary (Last 24 hours) at 12/12/2019 1826 Last data filed at 12/12/2019 1418 Gross per 24 hour  Intake 480 ml  Output --   Net 480 ml   Filed Weights   12/11/19 1519  Weight: 70.3 kg    Exam:  GEN: NAD SKIN: No rash EYES: EOMI ENT: MMM CV: RRR PULM: CTA B ABD: soft, ND, NT, +BS CNS: AAO x 3, non focal, tremors of bilateral hands, mild slurred speech EXT: No edema or tenderness   Data Reviewed:   I have personally reviewed following labs and imaging studies:  Labs: Labs show the following:   Basic Metabolic Panel: Recent Labs  Lab 12/11/19 1540  NA 144  K 3.5  CL 111  CO2 26  GLUCOSE 98  BUN 30*  CREATININE 0.48  CALCIUM 8.6*   GFR Estimated Creatinine Clearance (by C-G formula based on SCr of 0.48 mg/dL) Female: 60.4 mL/min Female: 76.4 mL/min Liver Function Tests: Recent Labs  Lab 12/11/19 1540  AST 32  ALT 6  ALKPHOS 86  BILITOT 1.2  PROT 6.5  ALBUMIN 4.1   No results for input(s): LIPASE, AMYLASE in the last 168 hours. No results for input(s): AMMONIA in the last 168 hours. Coagulation profile No results for input(s): INR, PROTIME in the last 168 hours.  CBC: Recent Labs  Lab 12/11/19 1540  WBC 5.4  NEUTROABS 3.5  HGB 12.6  HCT 38.7  MCV 88.0  PLT 184   Cardiac Enzymes: No results for input(s): CKTOTAL, CKMB, CKMBINDEX, TROPONINI in the last 168 hours. BNP (last 3 results) No results for input(s): PROBNP in the last 8760 hours. CBG: No results for input(s): GLUCAP in the last 168 hours. D-Dimer: No results for input(s): DDIMER in the last 72 hours. Hgb A1c: No results for input(s): HGBA1C in the last 72 hours. Lipid Profile: No results for input(s): CHOL, HDL, LDLCALC, TRIG, CHOLHDL, LDLDIRECT in the last 72 hours. Thyroid function studies: No results for input(s): TSH, T4TOTAL, T3FREE, THYROIDAB in the last 72 hours.  Invalid input(s): FREET3 Anemia work up: No results for input(s): VITAMINB12, FOLATE, FERRITIN, TIBC, IRON, RETICCTPCT in the last 72 hours. Sepsis Labs: Recent Labs  Lab 12/11/19 1540  WBC 5.4    Microbiology Recent  Results (from the past 240 hour(s))  Respiratory Panel by RT PCR (Flu A&B, Covid) - Nasopharyngeal Swab     Status: None   Collection Time: 12/11/19  6:29 PM   Specimen: Nasopharyngeal Swab  Result Value Ref Range Status   SARS Coronavirus 2 by RT PCR NEGATIVE NEGATIVE Final    Comment: (NOTE) SARS-CoV-2 target nucleic acids are NOT DETECTED. The SARS-CoV-2 RNA is generally detectable in upper respiratoy specimens during the acute phase of infection. The lowest concentration of SARS-CoV-2 viral copies this assay can detect is 131 copies/mL. A negative result does not preclude SARS-Cov-2 infection and should not be used as the sole basis for treatment or other patient management decisions. A negative result may occur with  improper specimen collection/handling, submission of specimen other than nasopharyngeal swab, presence of viral mutation(s) within the areas targeted by this assay, and inadequate number of viral copies (<131 copies/mL). A negative result must be combined with clinical observations, patient history, and epidemiological information. The expected result is Negative. Fact Sheet for Patients:  PinkCheek.be Fact Sheet for Healthcare Providers:  GravelBags.it This test is not yet ap proved or cleared by the [place] FDA and  has been authorized for detection and/or diagnosis of SARS-CoV-2 by FDA under an Emergency Use Authorization (EUA). This EUA will remain  in effect (meaning this test can be used) for the duration of the COVID-19 declaration under Section 564(b)(1) of the Act, 21 U.S.C. section 360bbb-3(b)(1), unless the authorization is terminated or revoked sooner.    Influenza A by PCR NEGATIVE NEGATIVE Final   Influenza B by PCR NEGATIVE NEGATIVE Final    Comment: (NOTE) The Xpert Xpress SARS-CoV-2/FLU/RSV assay is intended as an aid in  the diagnosis of influenza from Nasopharyngeal swab specimens  and  should not be used as a sole basis for treatment. Nasal washings and  aspirates are unacceptable for Xpert Xpress SARS-CoV-2/FLU/RSV  testing. Fact Sheet for Patients: PinkCheek.be Fact Sheet for Healthcare Providers: GravelBags.it This test is not yet approved or cleared by the [place] FDA and  has been authorized for detection and/or diagnosis of SARS-CoV-2 by  FDA under an Emergency Use Authorization (EUA). This EUA will remain  in effect (meaning this test can be used) for the duration of the  Covid-19 declaration under Section 564(b)(1) of the Act, 21  U.S.C. section 360bbb-3(b)(1), unless the authorization is  terminated or revoked. Performed at [facility], [address] [place], [place] [address]     Procedures and diagnostic studies:  CT Head Wo Contrast  Result Date: 12/11/2019 CLINICAL DATA:  Pain status post fall EXAM: CT HEAD WITHOUT CONTRAST CT CERVICAL SPINE WITHOUT CONTRAST TECHNIQUE: Multidetector CT imaging of the head and cervical spine was performed following the standard protocol without intravenous contrast. Multiplanar CT image reconstructions of the cervical spine were also generated. COMPARISON:  None recent FINDINGS: CT HEAD FINDINGS Brain: No evidence of acute infarction, hemorrhage, hydrocephalus, extra-axial collection or mass lesion/mass effect. Vascular: No hyperdense vessel or unexpected calcification. Skull: Normal. Negative for fracture or focal lesion. Sinuses/Orbits: No acute finding. Other: None. CT CERVICAL SPINE FINDINGS Alignment: Normal. Skull base and vertebrae: No acute fracture. No primary bone lesion or focal pathologic process. Soft tissues and spinal canal: No prevertebral fluid or swelling. No visible canal hematoma. Disc levels: Multilevel degenerative changes are noted throughout the cervical spine. Upper chest: Negative. Other: None IMPRESSION: 1. Unremarkable CT  evaluation of the head. 2. No evidence for acute fracture or subluxation of the cervical spine. 3. Multilevel degenerative changes of the cervical spine. Electronically Signed   By: Constance Holster M.D.   On: 12/11/2019 15:59   CT Cervical  Spine Wo Contrast  Result Date: 12/11/2019 CLINICAL DATA:  Pain status post fall EXAM: CT HEAD WITHOUT CONTRAST CT CERVICAL SPINE WITHOUT CONTRAST TECHNIQUE: Multidetector CT imaging of the head and cervical spine was performed following the standard protocol without intravenous contrast. Multiplanar CT image reconstructions of the cervical spine were also generated. COMPARISON:  None recent FINDINGS: CT HEAD FINDINGS Brain: No evidence of acute infarction, hemorrhage, hydrocephalus, extra-axial collection or mass lesion/mass effect. Vascular: No hyperdense vessel or unexpected calcification. Skull: Normal. Negative for fracture or focal lesion. Sinuses/Orbits: No acute finding. Other: None. CT CERVICAL SPINE FINDINGS Alignment: Normal. Skull base and vertebrae: No acute fracture. No primary bone lesion or focal pathologic process. Soft tissues and spinal canal: No prevertebral fluid or swelling. No visible canal hematoma. Disc levels: Multilevel degenerative changes are noted throughout the cervical spine. Upper chest: Negative. Other: None IMPRESSION: 1. Unremarkable CT evaluation of the head. 2. No evidence for acute fracture or subluxation of the cervical spine. 3. Multilevel degenerative changes of the cervical spine. Electronically Signed   By: Constance Holster M.D.   On: 12/11/2019 15:59   MR Brain Wo Contrast (neuro protocol)  Result Date: 12/11/2019 CLINICAL DATA:  Increasing frequency of falls with slurred speech. EXAM: MRI HEAD WITHOUT CONTRAST TECHNIQUE: Multiplanar, multiecho pulse sequences of the brain and surrounding structures were obtained without intravenous contrast. COMPARISON:  Head CT 12/11/2019 and MRI 03/07/2009 FINDINGS: Brain: A thin subdural  hematoma over the right cerebral convexity and along the posterior aspect of the falx measures up to 3 mm in thickness, and there is no significant associated mass effect. No hyperdense blood products are evident on today's CT, and this hematoma may be nonacute. No acute infarct, mass, or midline shift is present. No significant white matter disease is seen for age. The ventricles are normal in size. Vascular: Major intracranial vascular flow voids are preserved. Skull and upper cervical spine: No suspicious marrow lesion. Sinuses/Orbits: Left cataract extraction. Minimal left maxillary sinus mucosal thickening. Clear mastoid air cells. Other: None. IMPRESSION: 1. Thin subdural hematoma over the right cerebral convexity and falx without significant mass effect. 2. Otherwise unremarkable appearance of the brain for age. Critical Value/emergent results were called by telephone at the time of interpretation on 12/11/2019 at 6:09 pm to Dr. Derrell Lolling , who verbally acknowledged these results. Electronically Signed   By: Logan Bores M.D.   On: 12/11/2019 18:09   MR Cervical Spine Wo Contrast  Result Date: 12/11/2019 CLINICAL DATA:  Patient's fall along greater than 10 times. History of Parkinson's disease. EXAM: MRI CERVICAL SPINE WITHOUT CONTRAST TECHNIQUE: Multiplanar, multisequence MR imaging of the cervical spine was performed. No intravenous contrast was administered. COMPARISON:  None. FINDINGS: Alignment: Physiologic. Vertebrae: No fracture, evidence of discitis, or bone lesion. Cord: Normal signal and morphology. Posterior Fossa, vertebral arteries, paraspinal tissues: Posterior fossa demonstrates no focal abnormality. Vertebral artery flow voids are maintained. Paraspinal soft tissues are unremarkable. Disc levels: Discs: Degenerative disease with disc height loss C5-6, C6-7, C7-T1, T1-2 and T2-3. C2-3: No significant disc bulge. Severe left facet arthropathy. Mild left foraminal stenosis. No central canal  stenosis. C3-4: Mild broad-based disc bulge. Severe bilateral facet arthropathy. Bilateral uncovertebral degenerative changes. Severe bilateral foraminal stenosis. No central canal stenosis. C4-5: Minimal broad-based disc bulge. Moderate bilateral facet arthropathy. Bilateral uncovertebral degenerative changes. Severe left and moderate-severe right foraminal stenosis. No central canal stenosis. C5-6: Broad-based disc bulge. Moderate bilateral facet arthropathy with bilateral uncovertebral degenerative changes. Severe left foraminal stenosis. Moderate  right foraminal stenosis. No central canal stenosis. C6-7: Broad-based disc osteophyte complex. Left uncovertebral degenerative changes. Severe left foraminal stenosis. No right foraminal stenosis. No central canal stenosis. C7-T1: Broad-based disc bulge. Mild bilateral facet arthropathy. No neural foraminal stenosis. No central canal stenosis. T1-2: Mild broad-based disc bulge. Moderate left foraminal stenosis. No right foraminal stenosis. IMPRESSION: 1. At C3-4 there is a mild broad-based disc bulge. Severe bilateral facet arthropathy. Bilateral uncovertebral degenerative changes. Severe bilateral foraminal stenosis. 2. At C4-5 there is a minimal broad-based disc bulge. Moderate bilateral facet arthropathy. Bilateral uncovertebral degenerative changes. Severe left and moderate-severe right foraminal stenosis. 3. At C5-6 there is a broad-based disc bulge. Moderate bilateral facet arthropathy with bilateral uncovertebral degenerative changes. Severe left foraminal stenosis. Moderate right foraminal stenosis. 4. At C6-7 there is a broad-based disc osteophyte complex. Left uncovertebral degenerative changes. Severe left foraminal stenosis. She Electronically Signed   By: Kathreen Devoid   On: 12/11/2019 19:51    Medications:   . carbidopa-levodopa  2 tablet Oral 7 times per day  . donepezil  5 mg Oral QHS  . entacapone  200 mg Oral 7 times per day  . escitalopram  10  mg Oral Daily  . melatonin  5 mg Oral QHS  . neomycin-polymyxin b-dexamethasone  1 application Left Eye QHS  . pantoprazole  40 mg Oral Daily  . prednisoLONE acetate  1 drop Left Eye QHS  . pregabalin  100 mg Oral TID  . senna  1 tablet Oral BID  . sodium chloride flush  3 mL Intravenous Q12H   Continuous Infusions:   LOS: 1 day   Sakai Heinle  [facility]     12/12/2019, 6:26 PM

## 2019-12-12 NOTE — Consult Note (Addendum)
Referring Physician:  No referring provider defined for this encounter.  Primary Physician:  Cletis Athens, MD  Chief Complaint:  Subdural fluid collection s/p fall  History of Present Illness: Sarah Donaldson is a 69 y.o. adult with PMH of Parkinson's, prior spine surgery by Dr Izora Ribas in  [employer], GERD, and frequent falls. Per chart review, and confirmed by the patient, this past week she has fallen greater than 10 times.  Most recently she was walking in her kitchen and fell and hit the back of her head against the refrigerator.  She denied any loss of consciousness.  He was brought to the ED for further evaluation, and a head CT showed a small R subdural hematoma that is T1 bright and T2 bright, most consistent with subacute (25-48 weeks of age) collection.  A cervical spine CT was also completed which was negative for fracture.  Per chart review she does take aspirin 81 mg daily.  Her coagulation studies are normal, platelets are 184. Neurosurgery is consulted given the small subdural hematoma.  On exam, she is neurologically intact.  She does endorse some slurred speech which has been present for the last few months which she attributes to medication.  No worsening or change since the most recent fall.  Sarah Donaldson has no symptoms of cervical myelopathy.   Review of Systems:  A 10 point review of systems is negative, except for the pertinent positives and negatives detailed in the HPI.  Past Medical History: Past Medical History:  Diagnosis Date  . Cancer ([place])    Non-hodgkin's lymphoma (in remission)  . GERD (gastroesophageal reflux disease)    well-controlled w/ omeprazole  . Hyperlipidemia   . Knee joint cyst, left 02/07/2018  . Parkinson disease ([place])   . Parkinson disease [facility])     Past Surgical History: Past Surgical History:  Procedure Laterality Date  . BACK SURGERY    . BUNIONECTOMY    . NASAL SEPTUM SURGERY    . RETINAL DETACHMENT SURGERY    . TOTAL KNEE  ARTHROPLASTY Left 06/07/2019   Procedure: TOTAL KNEE ARTHROPLASTY;  Surgeon: Lovell Sheehan, MD;  Location: [facility] ORS;  Service: Orthopedics;  Laterality: Left;    Allergies: Allergies as of 12/11/2019 - Review Complete 12/11/2019  Allergen Reaction Noted  . Baclofen  05/25/2019  . Codeine sulfate Nausea And Vomiting 08/16/2014  . Gabapentin Swelling 11/17/2017  . Terazosin  04/04/2019    Medications:  Current Facility-Administered Medications:  .  acetaminophen (TYLENOL) tablet 650 mg, 650 mg, Oral, Q8H PRN, Norins, Heinz Knuckles, MD, 650 mg at 12/11/19 2327 .  carbidopa-levodopa (SINEMET IR) 25-100 MG per tablet immediate release 2 tablet, 2 tablet, Oral, 7 times per day, Norins, Heinz Knuckles, MD, 2 tablet at 12/12/19 [phone] .  diclofenac (VOLTAREN) EC tablet 75 mg, 75 mg, Oral, BID, Norins, Heinz Knuckles, MD, 75 mg at 12/11/19 2327 .  donepezil (ARICEPT) tablet 5 mg, 5 mg, Oral, QHS, Norins, Heinz Knuckles, MD, 5 mg at 12/11/19 2327 .  entacapone (COMTAN) tablet 200 mg, 200 mg, Oral, 7 times per day, Norins, Heinz Knuckles, MD, 200 mg at 12/12/19 0819 .  escitalopram (LEXAPRO) tablet 10 mg, 10 mg, Oral, Daily, Norins, Heinz Knuckles, MD, 10 mg at 12/11/19 2151 .  haloperidol (HALDOL) tablet 1 mg, 1 mg, Oral, Q4H PRN **OR** haloperidol lactate (HALDOL) injection 1 mg, 1 mg, Intramuscular, Q4H PRN, Norins, Heinz Knuckles, MD .  ibuprofen (ADVIL) tablet 400 mg, 400 mg, Oral, Q6H PRN, Adella Hare  E, MD .  melatonin tablet 5 mg, 5 mg, Oral, QHS, Norins, Heinz Knuckles, MD, 5 mg at 12/11/19 2327 .  neomycin-polymyxin b-dexamethasone (MAXITROL) ophthalmic ointment 1 application, 1 application, Left Eye, QHS, Norins, Heinz Knuckles, MD, 1 application at [id number] .  pantoprazole (PROTONIX) EC tablet 40 mg, 40 mg, Oral, Daily, Norins, Heinz Knuckles, MD, 40 mg at 12/11/19 2154 .  prednisoLONE acetate (PRED FORTE) 1 % ophthalmic suspension 1 drop, 1 drop, Left Eye, QHS, Norins, Michael E, MD .  pregabalin (LYRICA) capsule 100 mg, 100  mg, Oral, TID, Norins, Heinz Knuckles, MD, 100 mg at 12/11/19 2327 .  senna (SENOKOT) tablet 8.6 mg, 1 tablet, Oral, BID, Norins, Heinz Knuckles, MD, 8.6 mg at 12/11/19 2326 .  sodium chloride flush (NS) 0.9 % injection 3 mL, 3 mL, Intravenous, Q12H, Norins, Heinz Knuckles, MD, 3 mL at 12/11/19 2330 .  sodium chloride flush (NS) 0.9 % injection 3 mL, 3 mL, Intravenous, PRN, Norins, Heinz Knuckles, MD .  tiZANidine (ZANAFLEX) tablet 2 mg, 2 mg, Oral, TID PRN, Norins, Heinz Knuckles, MD   Social History: Social History   Tobacco Use  . Smoking status: Former Research scientist (life sciences)  . Smokeless tobacco: Never Used  Substance Use Topics  . Alcohol use: Yes    Alcohol/week: 0.0 standard drinks  . Drug use: No    Family Medical History: Family History  Problem Relation Age of Onset  . Heart disease Mother   . Alcohol abuse Father   . Breast cancer Neg Hx   . Mental illness Neg Hx     Physical Examination: Vitals:   12/12/19 0351 12/12/19 0816  BP: (!) 147/84 139/80  Pulse: 65 69  Resp: 17   Temp: 98.1 F (36.7 C) 97.7 F (36.5 C)  SpO2: 97% 96%     General: Patient is well developed, well nourished, calm, collected, and in no apparent distress.  Psychiatric: Patient is non-anxious.  Head:  R eye: pupil round, reactive to light. L eye: ovoid pupil from prior retinal detachment (pt states this is her normal pupil shape, the L eye is "dead")   ENT:  Oral mucosa appears well hydrated.  Neck:   Supple. No TTP   Respiratory: Patient is breathing without any difficulty.  Extremities: No edema.  Vascular: Palpable pulses in dorsal pedal vessels.  Skin:   On exposed skin, there are no abnormal skin lesions.  NEUROLOGICAL:  General: In no acute distress.   Awake, alert, oriented to person, place, and time.  R pupil round and reactive to light (L as above). EOMI to R eye, left with difficulty w/ EOM however pt reports that this is normal for her due to previous eye condition.  Visual fields full.  Facial tone  is symmetric.  Tongue protrusion is midline.  There is no pronator drift. Able to name x 3, repeat, and comprehend. SILT throughout all extremities.    ROM of spine: Full.  Palpation of spine: nontender over cervical spine and throughout entire spine.   Strength: Moving extremities equal bilaterally, no weakness or drift noted.   Strength: Side Biceps Triceps Deltoid Grip  R 5 5 5 5   L 5 5 5 5    Lower extremity strength is pain and effort dependent but strength intact and moving legs spontaneously and to command.                                                                                                                                                             Marland Kitchen  Side Iliopsoas Quads Hamstring PF DF  R 5 5 5 5 5   L 5 5 5 5 5   Hoffman's is absent.  Imaging: CT Head and cervical spine 12/11/2019: 1. Unremarkable CT evaluation of the head. 2. No evidence for acute fracture or subluxation of the cervical spine. 3. Multilevel degenerative changes of the cervical spine.  Assessment and Plan: Ms. Mancinelli is a pleasant 69 y.o. adult with small R subdural hematoma that is T1 bright and T2 bright, most consistent with subacute (40-46 weeks of age) collection.    As her neurologic exam appears intact and stable, there is no surgical intervention necessary at this time. No indication for repeat head CT currently unless neurological decline. Recommend discontinuing diclofenac and ibuprofen in the setting of frequent falls and SDH. Unclear why she is taking aspirin-if no concrete indication for this would hold.  Agree with admission to medicine for evaluation of frequent falls.  It may be worthwhile to have a neurology consult given her history of Parkinson's disease. No AEDs are indicated.    We will have her follow-up in clinic in 3 months with a repeat head CT at that time.   Lonell Face, NP

## 2019-12-12 NOTE — TOC Initial Note (Signed)
Transition of Care [facility]) - Initial/Assessment Note    Patient Details  Name: Sarah Donaldson MRN: [id number] Date of Birth: 09-01-1950  Transition of Care [facility]) CM/SW Contact:    Shelbie Hutching, RN Phone Number: 12/12/2019, 3:23 PM  Clinical Narrative:                 Patient admitted with subdural hematoma likely from 1-3 weeks ago per neurosurgery.  Patient's husband reports that patient has been falling at home and falling multiple times.  Patient believes that she is falling because of medication changes at her last neurology appointment. PT has evaluated and recommends skilled nursing for rehab, patient does not want to do this.  Patient prefers to go home with home health services.  Patient has all needed equipment at home.   RNCM suggested that patient and husband think about SNF overnight and we can discuss discharge planning further tomorrow.    Expected Discharge Plan: [place] Barriers to Discharge: Continued Medical Work up   Patient Goals and CMS Choice Patient states their goals for this hospitalization and ongoing recovery are:: patient wants to go home and doesn't understand why she can't go home today CMS Medicare.gov Compare Post Acute Care list provided to:: Patient Choice offered to / list presented to : Patient  Expected Discharge Plan and Services Expected Discharge Plan: [place]   Discharge Planning Services: CM Consult Post Acute Care Choice: [place] arrangements for the past 2 months: Single Family Home                                      Prior Living Arrangements/Services Living arrangements for the past 2 months: Single Family Home Lives with:: Spouse Patient language and need for interpreter reviewed:: Yes Do you feel safe going back to the place where you live?: Yes      Need for Family Participation in Patient Care: Yes (Comment)(parkinson's and frequent falls) Care giver support system in  place?: Yes (comment) Current home services: DME(walker, shower chair, bedside commode, wheelchair) Criminal Activity/Legal Involvement Pertinent to Current Situation/Hospitalization: No - Comment as needed  Activities of Daily Living Home Assistive Devices/Equipment: Environmental consultant (specify type), Wheelchair ADL Screening (condition at time of admission) Patient's cognitive ability adequate to safely complete daily activities?: Yes Is the patient deaf or have difficulty hearing?: No Does the patient have difficulty seeing, even when wearing glasses/contacts?: No Does the patient have difficulty concentrating, remembering, or making decisions?: No Patient able to express need for assistance with ADLs?: Yes Does the patient have difficulty dressing or bathing?: Yes Independently performs ADLs?: No Communication: Independent Dressing (OT): Needs assistance Is this a change from baseline?: Change from baseline, expected to last <3days Grooming: Needs assistance Is this a change from baseline?: Change from baseline, expected to last <3 days Feeding: Independent Bathing: Dependent Is this a change from baseline?: Pre-admission baseline Toileting: Needs assistance Is this a change from baseline?: Pre-admission baseline In/Out Bed: Needs assistance Is this a change from baseline?: Change from baseline, expected to last <3 days Walks in Home: Needs assistance Is this a change from baseline?: Pre-admission baseline Does the patient have difficulty walking or climbing stairs?: Yes Weakness of Legs: Both Weakness of Arms/Hands: None  Permission Sought/Granted Permission sought to share information with : Case Manager, Family Supports Permission granted to share information with : Yes, Verbal Permission  Granted        Permission granted to share info w Relationship: Husband     Emotional Assessment Appearance:: Appears stated age Attitude/Demeanor/Rapport: Engaged Affect (typically observed):  Frustrated Orientation: : Oriented to Self, Oriented to Place, Oriented to  Time, Oriented to Situation Alcohol / Substance Use: Not Applicable Psych Involvement: No (comment)  Admission diagnosis:  Subdural hematoma (HCC) [S06.5X9A] SDH (subdural hematoma) ([place]) [S06.5X9A] Frequent falls [R29.6] Unsteadiness [R26.81] Fall, initial encounter [W19.XXXA] Patient Active Problem List   Diagnosis Date Noted  . SDH (subdural hematoma) ([place]) 12/11/2019  . Falls frequently 12/11/2019  . S/P total knee replacement, left 06/07/2019  . Osteoarthritis of knee 04/27/2019  . Panic attacks 04/07/2019  . Psychosis ([place]) 04/04/2019  . Numbness 03/09/2018  . Lumbar spondylosis 03/07/2018  . Chronic pain of left knee 03/07/2018  . Chronic upper back pain 12/08/2017  . Benign neoplasm of hand 10/20/2017  . Chronic pain syndrome 10/20/2017  . Chronic pain of right lower extremity 10/20/2017  . Chronic myofascial pain 10/20/2017  . Numbness and tingling 07/12/2017  . Sleep difficulties 07/12/2017  . Low back pain 01/18/2017  . Sleep behavior disorder, REM 01/18/2017  . Lumbar radiculopathy 11/01/2016  . Follicular lymphoma of extranodal and solid organ sites [facility]) 05/01/2016  . Lymphoma ([place]) 04/30/2016  . Hallux limitus 07/27/2015  . Low back pain with sciatica 02/13/2015  . Bilateral low back pain with sciatica 02/13/2015  . H/O adenomatous polyp of colon 04/06/2014  . Hx of adenomatous colonic polyps 04/06/2014  . Back ache 01/25/2014  . Adiposity 01/25/2014  . REM sleep behavior disorder 01/25/2014  . Disordered sleep 01/25/2014  . Has a tremor 01/25/2014  . Obesity, unspecified 01/25/2014  . Sleep disorder 01/25/2014  . Backache 01/25/2014  . Tremor 01/25/2014  . Obesity 01/25/2014  . Difficulty hearing 06/08/2012  . Hearing loss 06/08/2012  . Anxiety disorder 06/06/2012  . Colon polyp 06/06/2012  . Clinical depression 06/06/2012  . Hypercholesteremia 06/06/2012  . Idiopathic  Parkinson's disease ([place]) 06/06/2012  . Detached retina 06/06/2012  . Depressive disorder 06/06/2012  . Retinal detachment 06/06/2012  . Parkinson's disease ([place]) 06/06/2012   PCP:  Cletis Athens, MD Pharmacy:   [id number] [place], [place] - [address] [address] Phone: [phone] Fax: [phone]  [facility] [place], [place] - [address] [place] [address] Phone: [phone] Fax: [phone]  [id number] [place], [place] - [address] AT [facility] [address] [place] [address] Phone: [phone] Fax: [phone]     Social Determinants of Health (SDOH) Interventions    Readmission Risk Interventions No flowsheet data found.

## 2019-12-12 NOTE — Evaluation (Signed)
Physical Therapy Evaluation Patient Details Name: Sarah Donaldson MRN: [id number] DOB: 09-Jul-1951 Today's Date: 12/12/2019   History of Present Illness  Pt admitted for subdural hematoma. Pt with complaints of multiple falls (>25 since last week) and slurred speech. Also complains of dizziness, but orthostatics negative. History includes Parkinsons, HLD, and GERD.  Clinical Impression  Pt is a pleasant 69 year old female who was admitted for subdural hematoma from a fall. Pt performs bed mobility with cga,  transfers with min assist, and ambulation with cga. Pt with increased tremors during session, making it unsafe for further progression of ambulation. Pt demonstrates deficits with strength/mobility/balance. Pt with multiple falls and continues to be high risk for falls. Currently not at baseline level. Would benefit from skilled PT to address above deficits and promote optimal return to PLOF; recommend transition to STR upon discharge from acute hospitalization.     Follow Up Recommendations SNF    Equipment Recommendations  None recommended by PT    Recommendations for Other Services       Precautions / Restrictions Precautions Precautions: Fall Restrictions Weight Bearing Restrictions: No      Mobility  Bed Mobility Overal bed mobility: Needs Assistance Bed Mobility: Supine to Sit     Supine to sit: Min guard     General bed mobility comments: ease of transfer despite having severe tremors. Tremors appear to improve with movement and worsen at rest  Transfers Overall transfer level: Needs assistance Equipment used: Rolling walker (2 wheeled) Transfers: Sit to/from Stand Sit to Stand: Min assist         General transfer comment: safe technique with upright posture.   Ambulation/Gait Ambulation/Gait assistance: Min guard Gait Distance (Feet): 8 Feet Assistive device: Rolling walker (2 wheeled) Gait Pattern/deviations: Step-to pattern     General Gait Details:  ambulated from bed->BSC->recliner including retro ambulation. Pt with small step length and needs cues for directions. Doesn't have contacts in, and it appears to limit vision. Not safe to progress to further ambulation at this time  Stairs            Wheelchair Mobility    Modified Rankin (Stroke Patients Only)       Balance Overall balance assessment: Needs assistance;History of Falls Sitting-balance support: Feet supported Sitting balance-Leahy Scale: Fair Sitting balance - Comments: slight post leaning, tremors noted   Standing balance support: Bilateral upper extremity supported Standing balance-Leahy Scale: Fair Standing balance comment: able to take hands off RW for donning/doffing underwear, needs min assist to maintain                             Pertinent Vitals/Pain Pain Assessment: No/denies pain    Home Living Family/patient expects to be discharged to:: Private residence Living Arrangements: Spouse/significant other Available Help at Discharge: Family Type of Home: House Home Access: Stairs to enter Entrance Stairs-Rails: Right Entrance Stairs-Number of Steps: 1 Home Layout: One level Home Equipment: Grab bars - tub/shower;Grab bars - toilet;Walker - 2 wheels;Electric scooter;Wheelchair - manual;Bedside commode      Prior Function Level of Independence: Needs assistance   Gait / Transfers Assistance Needed: reports she was using RW primarily, however has been using WC for long distances. Has electric scooter, however battery dead  ADL's / Homemaking Assistance Needed: Pt reports husband helps with bathing and dressing        Hand Dominance        Extremity/Trunk Assessment   Upper  Extremity Assessment Upper Extremity Assessment: Overall WFL for tasks assessed    Lower Extremity Assessment Lower Extremity Assessment: Generalized weakness(B LE grossly 4/5 increased resting tremors)       Communication   Communication: No  difficulties  Cognition Arousal/Alertness: Awake/alert Behavior During Therapy: WFL for tasks assessed/performed Overall Cognitive Status: Within Functional Limits for tasks assessed                                        General Comments      Exercises Other Exercises Other Exercises: Pt able to ambulated over to [facility]. Follows commands well for sequencing. Is limited due to tremors (pt reports she takes meds Q2 Hr for symptoms). Able to perform hygiene safely. Does need min assist for balance during standing.   Assessment/Plan    PT Assessment Patient needs continued PT services  PT Problem List Decreased strength;Decreased activity tolerance;Decreased balance;Decreased mobility       PT Treatment Interventions Gait training;DME instruction;Therapeutic exercise;Balance training    PT Goals (Current goals can be found in the Care Plan section)  Acute Rehab PT Goals Patient Stated Goal: to get stronger PT Goal Formulation: With patient Time For Goal Achievement: 12/26/19 Potential to Achieve Goals: Good    Frequency Min 2X/week   Barriers to discharge        Co-evaluation               AM-PAC PT "6 Clicks" Mobility  Outcome Measure Help needed turning from your back to your side while in a flat bed without using bedrails?: A Little Help needed moving from lying on your back to sitting on the side of a flat bed without using bedrails?: A Little Help needed moving to and from a bed to a chair (including a wheelchair)?: A Little Help needed standing up from a chair using your arms (e.g., wheelchair or bedside chair)?: A Little Help needed to walk in hospital room?: A Little Help needed climbing 3-5 steps with a railing? : A Little 6 Click Score: 18    End of Session Equipment Utilized During Treatment: Gait belt Activity Tolerance: Patient tolerated treatment well Patient left: in chair;with chair alarm set Nurse Communication: Mobility status PT  Visit Diagnosis: Unsteadiness on feet (R26.81);Repeated falls (R29.6);Muscle weakness (generalized) (M62.81);Difficulty in walking, not elsewhere classified (R26.2)    Time: [id number] PT Time Calculation (min) (ACUTE ONLY): 26 min   Charges:   PT Evaluation $PT Eval Moderate Complexity: 1 Mod PT Treatments $Therapeutic Activity: 8-22 mins        Greggory Stallion, PT, DPT [phone]   Sarah Donaldson 12/12/2019, 11:01 AM

## 2019-12-12 NOTE — Consult Note (Signed)
Reason for Consult: falls  Requesting Physician: Dr. Mal Misty   CC: falls   HPI: Sarah Donaldson is an 69 y.o. adult with medical history significant of parkinson's, HLD, GERD, presents to the emergency department for recurrent falls as per pt and family. She states she willbe standing like normal, when all of a sudden she feels off balance and falls to the groundwithout warning.No associate syncope, she is alert and oriented throughout and knows that she has fall.Yesterday this happened while walking in her kitchen, she fell and hit the back of her head against the refrigerator. No loss of consciousness. Not on any blood thinning medications. Recently seen by Neurology Dr. Melrose Nakayama.  MRI reviewed with small SDH.   Past Medical History:  Diagnosis Date  . Cancer ([place])    Non-hodgkin's lymphoma (in remission)  . GERD (gastroesophageal reflux disease)    well-controlled w/ omeprazole  . Hyperlipidemia   . Knee joint cyst, left 02/07/2018  . Parkinson disease ([place])   . Parkinson disease [facility])     Past Surgical History:  Procedure Laterality Date  . BACK SURGERY    . BUNIONECTOMY    . NASAL SEPTUM SURGERY    . RETINAL DETACHMENT SURGERY    . TOTAL KNEE ARTHROPLASTY Left 06/07/2019   Procedure: TOTAL KNEE ARTHROPLASTY;  Surgeon: Lovell Sheehan, MD;  Location: [facility] ORS;  Service: Orthopedics;  Laterality: Left;    Family History  Problem Relation Age of Onset  . Heart disease Mother   . Alcohol abuse Father   . Breast cancer Neg Hx   . Mental illness Neg Hx     Social History:  reports that she has quit smoking. She has never used smokeless tobacco. She reports current alcohol use. She reports that she does not use drugs.  Allergies  Allergen Reactions  . Baclofen     Caused numbness in mouth   . Codeine Sulfate Nausea And Vomiting  . Gabapentin Swelling    Mouth swelling   . Terazosin     Medications: I have reviewed the patient's current  medications.  ROS: History obtained from the patient  General ROS: negative for - chills, fatigue, fever, night sweats, weight gain or weight loss Psychological ROS: negative for - behavioral disorder, hallucinations, memory difficulties, mood swings or suicidal ideation Ophthalmic ROS: negative for - blurry vision, double vision, eye pain or loss of vision ENT ROS: negative for - epistaxis, nasal discharge, oral lesions, sore throat, tinnitus or vertigo Allergy and Immunology ROS: negative for - hives or itchy/watery eyes Hematological and Lymphatic ROS: negative for - bleeding problems, bruising or swollen lymph nodes Endocrine ROS: negative for - galactorrhea, hair pattern changes, polydipsia/polyuria or temperature intolerance Respiratory ROS: negative for - cough, hemoptysis, shortness of breath or wheezing Cardiovascular ROS: negative for - chest pain, dyspnea on exertion, edema or irregular heartbeat Gastrointestinal ROS: negative for - abdominal pain, diarrhea, hematemesis, nausea/vomiting or stool incontinence Genito-Urinary ROS: negative for - dysuria, hematuria, incontinence or urinary frequency/urgency Musculoskeletal ROS: negative for - joint swelling or muscular weakness Neurological ROS: as noted in HPI Dermatological ROS: negative for rash and skin lesion changes  Physical Examination: Blood pressure 139/80, pulse 69, temperature 97.7 F (36.5 C), temperature source Oral, resp. rate 17, height 5\' 6"  (1.676 m), weight 70.3 kg, SpO2 96 %.    Neurological Examination   Mental Status: Alert, oriented, thought content appropriate.  Speech fluent without evidence of aphasia.  Able to follow 3 step commands without difficulty. Cranial  Nerves: II: Discs flat bilaterally; Visual fields grossly normal, pupils equal, round, reactive to light and accommodation III,IV, VI: ptosis not present, extra-ocular motions intact bilaterally V,VII: smile symmetric, facial light touch  sensation normal bilaterally XI: bilateral shoulder shrug XII: midline tongue extension Motor: Generalized weakness Sensory: Pinprick and light touch intact throughout, bilaterally Deep Tendon Reflexes: 2+ and symmetric throughout Plantars: Right: downgoing   Left: downgoing Cerebellar: normal finger-to-nose, normal rapid alternating movements and normal heel-to-shin test Gait: normal gait and station      Laboratory Studies:   Basic Metabolic Panel: Recent Labs  Lab 12/11/19 1540  NA 144  K 3.5  CL 111  CO2 26  GLUCOSE 98  BUN 30*  CREATININE 0.48  CALCIUM 8.6*    Liver Function Tests: Recent Labs  Lab 12/11/19 1540  AST 32  ALT 6  ALKPHOS 86  BILITOT 1.2  PROT 6.5  ALBUMIN 4.1   No results for input(s): LIPASE, AMYLASE in the last 168 hours. No results for input(s): AMMONIA in the last 168 hours.  CBC: Recent Labs  Lab 12/11/19 1540  WBC 5.4  NEUTROABS 3.5  HGB 12.6  HCT 38.7  MCV 88.0  PLT 184    Cardiac Enzymes: No results for input(s): CKTOTAL, CKMB, CKMBINDEX, TROPONINI in the last 168 hours.  BNP: Invalid input(s): POCBNP  CBG: No results for input(s): GLUCAP in the last 168 hours.  Microbiology: Results for orders placed or performed during the hospital encounter of 12/11/19  Respiratory Panel by RT PCR (Flu A&B, Covid) - Nasopharyngeal Swab     Status: None   Collection Time: 12/11/19  6:29 PM   Specimen: Nasopharyngeal Swab  Result Value Ref Range Status   SARS Coronavirus 2 by RT PCR NEGATIVE NEGATIVE Final    Comment: (NOTE) SARS-CoV-2 target nucleic acids are NOT DETECTED. The SARS-CoV-2 RNA is generally detectable in upper respiratoy specimens during the acute phase of infection. The lowest concentration of SARS-CoV-2 viral copies this assay can detect is 131 copies/mL. A negative result does not preclude SARS-Cov-2 infection and should not be used as the sole basis for treatment or other patient management decisions. A  negative result may occur with  improper specimen collection/handling, submission of specimen other than nasopharyngeal swab, presence of viral mutation(s) within the areas targeted by this assay, and inadequate number of viral copies (<131 copies/mL). A negative result must be combined with clinical observations, patient history, and epidemiological information. The expected result is Negative. Fact Sheet for Patients:  PinkCheek.be Fact Sheet for Healthcare Providers:  GravelBags.it This test is not yet ap proved or cleared by the [place] FDA and  has been authorized for detection and/or diagnosis of SARS-CoV-2 by FDA under an Emergency Use Authorization (EUA). This EUA will remain  in effect (meaning this test can be used) for the duration of the COVID-19 declaration under Section 564(b)(1) of the Act, 21 U.S.C. section 360bbb-3(b)(1), unless the authorization is terminated or revoked sooner.    Influenza A by PCR NEGATIVE NEGATIVE Final   Influenza B by PCR NEGATIVE NEGATIVE Final    Comment: (NOTE) The Xpert Xpress SARS-CoV-2/FLU/RSV assay is intended as an aid in  the diagnosis of influenza from Nasopharyngeal swab specimens and  should not be used as a sole basis for treatment. Nasal washings and  aspirates are unacceptable for Xpert Xpress SARS-CoV-2/FLU/RSV  testing. Fact Sheet for Patients: PinkCheek.be Fact Sheet for Healthcare Providers: GravelBags.it This test is not yet approved or cleared by the [place]  States FDA and  has been authorized for detection and/or diagnosis of SARS-CoV-2 by  FDA under an Emergency Use Authorization (EUA). This EUA will remain  in effect (meaning this test can be used) for the duration of the  Covid-19 declaration under Section 564(b)(1) of the Act, 21  U.S.C. section 360bbb-3(b)(1), unless the authorization is   terminated or revoked. Performed at [facility], [address] [place], [place] [address]     Coagulation Studies: No results for input(s): LABPROT, INR in the last 72 hours.  Urinalysis:  Recent Labs  Lab 12/11/19 2231  COLORURINE ORANGE*  LABSPEC 1.037*  PHURINE TEST NOT REPORTED DUE TO COLOR INTERFERENCE OF URINE PIGMENT  GLUCOSEU TEST NOT REPORTED DUE TO COLOR INTERFERENCE OF URINE PIGMENT*  HGBUR TEST NOT REPORTED DUE TO COLOR INTERFERENCE OF URINE PIGMENT*  BILIRUBINUR TEST NOT REPORTED DUE TO COLOR INTERFERENCE OF URINE PIGMENT*  KETONESUR TEST NOT REPORTED DUE TO COLOR INTERFERENCE OF URINE PIGMENT*  PROTEINUR TEST NOT REPORTED DUE TO COLOR INTERFERENCE OF URINE PIGMENT*  NITRITE TEST NOT REPORTED DUE TO COLOR INTERFERENCE OF URINE PIGMENT*  LEUKOCYTESUR TEST NOT REPORTED DUE TO COLOR INTERFERENCE OF URINE PIGMENT*    Lipid Panel:  No results found for: CHOL, TRIG, HDL, CHOLHDL, VLDL, LDLCALC  HgbA1C: No results found for: HGBA1C  Urine Drug Screen:  No results found for: LABOPIA, COCAINSCRNUR, LABBENZ, AMPHETMU, THCU, LABBARB  Alcohol Level: No results for input(s): ETH in the last 168 hours.  Other results: EKG: normal EKG, normal sinus rhythm, unchanged from previous tracings.  Imaging: CT Head Wo Contrast  Result Date: 12/11/2019 CLINICAL DATA:  Pain status post fall EXAM: CT HEAD WITHOUT CONTRAST CT CERVICAL SPINE WITHOUT CONTRAST TECHNIQUE: Multidetector CT imaging of the head and cervical spine was performed following the standard protocol without intravenous contrast. Multiplanar CT image reconstructions of the cervical spine were also generated. COMPARISON:  None recent FINDINGS: CT HEAD FINDINGS Brain: No evidence of acute infarction, hemorrhage, hydrocephalus, extra-axial collection or mass lesion/mass effect. Vascular: No hyperdense vessel or unexpected calcification. Skull: Normal. Negative for fracture or focal lesion. Sinuses/Orbits: No  acute finding. Other: None. CT CERVICAL SPINE FINDINGS Alignment: Normal. Skull base and vertebrae: No acute fracture. No primary bone lesion or focal pathologic process. Soft tissues and spinal canal: No prevertebral fluid or swelling. No visible canal hematoma. Disc levels: Multilevel degenerative changes are noted throughout the cervical spine. Upper chest: Negative. Other: None IMPRESSION: 1. Unremarkable CT evaluation of the head. 2. No evidence for acute fracture or subluxation of the cervical spine. 3. Multilevel degenerative changes of the cervical spine. Electronically Signed   By: Constance Holster M.D.   On: 12/11/2019 15:59   CT Cervical Spine Wo Contrast  Result Date: 12/11/2019 CLINICAL DATA:  Pain status post fall EXAM: CT HEAD WITHOUT CONTRAST CT CERVICAL SPINE WITHOUT CONTRAST TECHNIQUE: Multidetector CT imaging of the head and cervical spine was performed following the standard protocol without intravenous contrast. Multiplanar CT image reconstructions of the cervical spine were also generated. COMPARISON:  None recent FINDINGS: CT HEAD FINDINGS Brain: No evidence of acute infarction, hemorrhage, hydrocephalus, extra-axial collection or mass lesion/mass effect. Vascular: No hyperdense vessel or unexpected calcification. Skull: Normal. Negative for fracture or focal lesion. Sinuses/Orbits: No acute finding. Other: None. CT CERVICAL SPINE FINDINGS Alignment: Normal. Skull base and vertebrae: No acute fracture. No primary bone lesion or focal pathologic process. Soft tissues and spinal canal: No prevertebral fluid or swelling. No visible canal hematoma. Disc levels: Multilevel  degenerative changes are noted throughout the cervical spine. Upper chest: Negative. Other: None IMPRESSION: 1. Unremarkable CT evaluation of the head. 2. No evidence for acute fracture or subluxation of the cervical spine. 3. Multilevel degenerative changes of the cervical spine. Electronically Signed   By: Constance Holster M.D.   On: 12/11/2019 15:59   MR Brain Wo Contrast (neuro protocol)  Result Date: 12/11/2019 CLINICAL DATA:  Increasing frequency of falls with slurred speech. EXAM: MRI HEAD WITHOUT CONTRAST TECHNIQUE: Multiplanar, multiecho pulse sequences of the brain and surrounding structures were obtained without intravenous contrast. COMPARISON:  Head CT 12/11/2019 and MRI 03/07/2009 FINDINGS: Brain: A thin subdural hematoma over the right cerebral convexity and along the posterior aspect of the falx measures up to 3 mm in thickness, and there is no significant associated mass effect. No hyperdense blood products are evident on today's CT, and this hematoma may be nonacute. No acute infarct, mass, or midline shift is present. No significant white matter disease is seen for age. The ventricles are normal in size. Vascular: Major intracranial vascular flow voids are preserved. Skull and upper cervical spine: No suspicious marrow lesion. Sinuses/Orbits: Left cataract extraction. Minimal left maxillary sinus mucosal thickening. Clear mastoid air cells. Other: None. IMPRESSION: 1. Thin subdural hematoma over the right cerebral convexity and falx without significant mass effect. 2. Otherwise unremarkable appearance of the brain for age. Critical Value/emergent results were called by telephone at the time of interpretation on 12/11/2019 at 6:09 pm to Dr. Derrell Lolling , who verbally acknowledged these results. Electronically Signed   By: Logan Bores M.D.   On: 12/11/2019 18:09   MR Cervical Spine Wo Contrast  Result Date: 12/11/2019 CLINICAL DATA:  Patient's fall along greater than 10 times. History of Parkinson's disease. EXAM: MRI CERVICAL SPINE WITHOUT CONTRAST TECHNIQUE: Multiplanar, multisequence MR imaging of the cervical spine was performed. No intravenous contrast was administered. COMPARISON:  None. FINDINGS: Alignment: Physiologic. Vertebrae: No fracture, evidence of discitis, or bone lesion. Cord: Normal signal  and morphology. Posterior Fossa, vertebral arteries, paraspinal tissues: Posterior fossa demonstrates no focal abnormality. Vertebral artery flow voids are maintained. Paraspinal soft tissues are unremarkable. Disc levels: Discs: Degenerative disease with disc height loss C5-6, C6-7, C7-T1, T1-2 and T2-3. C2-3: No significant disc bulge. Severe left facet arthropathy. Mild left foraminal stenosis. No central canal stenosis. C3-4: Mild broad-based disc bulge. Severe bilateral facet arthropathy. Bilateral uncovertebral degenerative changes. Severe bilateral foraminal stenosis. No central canal stenosis. C4-5: Minimal broad-based disc bulge. Moderate bilateral facet arthropathy. Bilateral uncovertebral degenerative changes. Severe left and moderate-severe right foraminal stenosis. No central canal stenosis. C5-6: Broad-based disc bulge. Moderate bilateral facet arthropathy with bilateral uncovertebral degenerative changes. Severe left foraminal stenosis. Moderate right foraminal stenosis. No central canal stenosis. C6-7: Broad-based disc osteophyte complex. Left uncovertebral degenerative changes. Severe left foraminal stenosis. No right foraminal stenosis. No central canal stenosis. C7-T1: Broad-based disc bulge. Mild bilateral facet arthropathy. No neural foraminal stenosis. No central canal stenosis. T1-2: Mild broad-based disc bulge. Moderate left foraminal stenosis. No right foraminal stenosis. IMPRESSION: 1. At C3-4 there is a mild broad-based disc bulge. Severe bilateral facet arthropathy. Bilateral uncovertebral degenerative changes. Severe bilateral foraminal stenosis. 2. At C4-5 there is a minimal broad-based disc bulge. Moderate bilateral facet arthropathy. Bilateral uncovertebral degenerative changes. Severe left and moderate-severe right foraminal stenosis. 3. At C5-6 there is a broad-based disc bulge. Moderate bilateral facet arthropathy with bilateral uncovertebral degenerative changes. Severe left  foraminal stenosis. Moderate right foraminal stenosis. 4. At C6-7  there is a broad-based disc osteophyte complex. Left uncovertebral degenerative changes. Severe left foraminal stenosis. She Electronically Signed   By: Kathreen Devoid   On: 12/11/2019 19:51     Assessment/Plan:  69 y.o. adult with medical history significant of parkinson's, HLD, GERD, presents to the emergency department for recurrent falls as per pt and family. She states she willbe standing like normal, when all of a sudden she feels off balance and falls to the groundwithout warning.No associate syncope, she is alert and oriented throughout and knows that she has fall.Yesterday this happened while walking in her kitchen, she fell and hit the back of her head against the refrigerator. No loss of consciousness. Not on any blood thinning medications. Recently seen by Neurology Dr. Melrose Nakayama.  MRI reviewed with small SDH.   - SDH small, no intervention. No need for anti epileptics will resolve on its own - significant dose of Sinemet at home that I don't want to change on d/c as I am not very familiar with her "on/off" events and she is managed by Dr. Melrose Nakayama - so much dopamine activity can cause significant postural hypotension - would obtain BP in different positions to see if she is orthostatic.  - if BP is low would consider midodrine  - no further repeat imaging from neurological stand point 12/12/2019, 12:18 PM

## 2019-12-13 LAB — GLUCOSE, CAPILLARY: Glucose-Capillary: 111 mg/dL — ABNORMAL HIGH (ref 70–99)

## 2019-12-13 MED ORDER — TIZANIDINE HCL 2 MG PO TABS: 2.0000 mg | ORAL_TABLET | Freq: Three times a day (TID) | ORAL | 0 refills | Status: DC | PRN

## 2019-12-13 MED ORDER — DONEPEZIL HCL 5 MG PO TABS: 5.0000 mg | ORAL_TABLET | Freq: Every day | ORAL | 0 refills | Status: DC

## 2019-12-13 MED ORDER — HALOPERIDOL 1 MG PO TABS: 1.0000 mg | ORAL_TABLET | ORAL | 0 refills | Status: DC | PRN

## 2019-12-13 MED ORDER — MELATONIN 5 MG PO TABS: 5.0000 mg | ORAL_TABLET | Freq: Every day | ORAL | 0 refills | Status: DC

## 2019-12-13 NOTE — Progress Notes (Signed)
Subjective: Much more awake and comfortable. Husband at bedside.   Past Medical History:  Diagnosis Date  . Cancer ([place])    Non-hodgkin's lymphoma (in remission)  . GERD (gastroesophageal reflux disease)    well-controlled w/ omeprazole  . Hyperlipidemia   . Knee joint cyst, left 02/07/2018  . Parkinson disease ([place])   . Parkinson disease [facility])     Past Surgical History:  Procedure Laterality Date  . BACK SURGERY    . BUNIONECTOMY    . NASAL SEPTUM SURGERY    . RETINAL DETACHMENT SURGERY    . TOTAL KNEE ARTHROPLASTY Left 06/07/2019   Procedure: TOTAL KNEE ARTHROPLASTY;  Surgeon: Lovell Sheehan, MD;  Location: [facility] ORS;  Service: Orthopedics;  Laterality: Left;    Family History  Problem Relation Age of Onset  . Heart disease Mother   . Alcohol abuse Father   . Breast cancer Neg Hx   . Mental illness Neg Hx     Social History:  reports that she has quit smoking. She has never used smokeless tobacco. She reports current alcohol use. She reports that she does not use drugs.  Allergies  Allergen Reactions  . Baclofen     Caused numbness in mouth   . Codeine Sulfate Nausea And Vomiting  . Gabapentin Swelling    Mouth swelling   . Terazosin     Medications: I have reviewed the patient's current medications.    Physical Examination: Blood pressure 138/74, pulse 81, temperature 97.9 F (36.6 C), temperature source Oral, resp. rate 15, height 5\' 6"  (1.676 m), weight 70.3 kg, SpO2 98 %.    Neurological Examination   Mental Status: Alert, oriented, thought content appropriate.  Speech fluent without evidence of aphasia.  Able to follow 3 step commands without difficulty. Cranial Nerves: II: Discs flat bilaterally; Visual fields grossly normal, pupils equal, round, reactive to light and accommodation III,IV, VI: ptosis not present, extra-ocular motions intact bilaterally V,VII: smile symmetric, facial light touch sensation normal bilaterally XI: bilateral shoulder  shrug XII: midline tongue extension Motor: Generalized weakness Sensory: Pinprick and light touch intact throughout, bilaterally Deep Tendon Reflexes: 2+ and symmetric throughout Plantars: Right: downgoing   Left: downgoing Cerebellar: normal finger-to-nose, normal rapid alternating movements and normal heel-to-shin test Gait: not tested      Laboratory Studies:   Basic Metabolic Panel: Recent Labs  Lab 12/11/19 1540  NA 144  K 3.5  CL 111  CO2 26  GLUCOSE 98  BUN 30*  CREATININE 0.48  CALCIUM 8.6*    Liver Function Tests: Recent Labs  Lab 12/11/19 1540  AST 32  ALT 6  ALKPHOS 86  BILITOT 1.2  PROT 6.5  ALBUMIN 4.1   No results for input(s): LIPASE, AMYLASE in the last 168 hours. No results for input(s): AMMONIA in the last 168 hours.  CBC: Recent Labs  Lab 12/11/19 1540  WBC 5.4  NEUTROABS 3.5  HGB 12.6  HCT 38.7  MCV 88.0  PLT 184    Cardiac Enzymes: No results for input(s): CKTOTAL, CKMB, CKMBINDEX, TROPONINI in the last 168 hours.  BNP: Invalid input(s): POCBNP  CBG: Recent Labs  Lab 12/13/19 0452  GLUCAP 111*    Microbiology: Results for orders placed or performed during the hospital encounter of 12/11/19  Respiratory Panel by RT PCR (Flu A&B, Covid) - Nasopharyngeal Swab     Status: None   Collection Time: 12/11/19  6:29 PM   Specimen: Nasopharyngeal Swab  Result Value Ref Range Status  SARS Coronavirus 2 by RT PCR NEGATIVE NEGATIVE Final    Comment: (NOTE) SARS-CoV-2 target nucleic acids are NOT DETECTED. The SARS-CoV-2 RNA is generally detectable in upper respiratoy specimens during the acute phase of infection. The lowest concentration of SARS-CoV-2 viral copies this assay can detect is 131 copies/mL. A negative result does not preclude SARS-Cov-2 infection and should not be used as the sole basis for treatment or other patient management decisions. A negative result may occur with  improper specimen collection/handling,  submission of specimen other than nasopharyngeal swab, presence of viral mutation(s) within the areas targeted by this assay, and inadequate number of viral copies (<131 copies/mL). A negative result must be combined with clinical observations, patient history, and epidemiological information. The expected result is Negative. Fact Sheet for Patients:  PinkCheek.be Fact Sheet for Healthcare Providers:  GravelBags.it This test is not yet ap proved or cleared by the [place] FDA and  has been authorized for detection and/or diagnosis of SARS-CoV-2 by FDA under an Emergency Use Authorization (EUA). This EUA will remain  in effect (meaning this test can be used) for the duration of the COVID-19 declaration under Section 564(b)(1) of the Act, 21 U.S.C. section 360bbb-3(b)(1), unless the authorization is terminated or revoked sooner.    Influenza A by PCR NEGATIVE NEGATIVE Final   Influenza B by PCR NEGATIVE NEGATIVE Final    Comment: (NOTE) The Xpert Xpress SARS-CoV-2/FLU/RSV assay is intended as an aid in  the diagnosis of influenza from Nasopharyngeal swab specimens and  should not be used as a sole basis for treatment. Nasal washings and  aspirates are unacceptable for Xpert Xpress SARS-CoV-2/FLU/RSV  testing. Fact Sheet for Patients: PinkCheek.be Fact Sheet for Healthcare Providers: GravelBags.it This test is not yet approved or cleared by the [place] FDA and  has been authorized for detection and/or diagnosis of SARS-CoV-2 by  FDA under an Emergency Use Authorization (EUA). This EUA will remain  in effect (meaning this test can be used) for the duration of the  Covid-19 declaration under Section 564(b)(1) of the Act, 21  U.S.C. section 360bbb-3(b)(1), unless the authorization is  terminated or revoked. Performed at [facility], [address] [place], [place] [address]     Coagulation Studies: No results for input(s): LABPROT, INR in the last 72 hours.  Urinalysis:  Recent Labs  Lab 12/11/19 2231  COLORURINE ORANGE*  LABSPEC 1.037*  PHURINE TEST NOT REPORTED DUE TO COLOR INTERFERENCE OF URINE PIGMENT  GLUCOSEU TEST NOT REPORTED DUE TO COLOR INTERFERENCE OF URINE PIGMENT*  HGBUR TEST NOT REPORTED DUE TO COLOR INTERFERENCE OF URINE PIGMENT*  BILIRUBINUR TEST NOT REPORTED DUE TO COLOR INTERFERENCE OF URINE PIGMENT*  KETONESUR TEST NOT REPORTED DUE TO COLOR INTERFERENCE OF URINE PIGMENT*  PROTEINUR TEST NOT REPORTED DUE TO COLOR INTERFERENCE OF URINE PIGMENT*  NITRITE TEST NOT REPORTED DUE TO COLOR INTERFERENCE OF URINE PIGMENT*  LEUKOCYTESUR TEST NOT REPORTED DUE TO COLOR INTERFERENCE OF URINE PIGMENT*    Lipid Panel:  No results found for: CHOL, TRIG, HDL, CHOLHDL, VLDL, LDLCALC  HgbA1C: No results found for: HGBA1C  Urine Drug Screen:  No results found for: LABOPIA, COCAINSCRNUR, LABBENZ, AMPHETMU, THCU, LABBARB  Alcohol Level: No results for input(s): ETH in the last 168 hours.  Other results: EKG: normal EKG, normal sinus rhythm, unchanged from previous tracings.  Imaging: CT Head Wo Contrast  Result Date: 12/11/2019 CLINICAL DATA:  Pain status post fall EXAM: CT HEAD WITHOUT CONTRAST CT CERVICAL SPINE WITHOUT CONTRAST  TECHNIQUE: Multidetector CT imaging of the head and cervical spine was performed following the standard protocol without intravenous contrast. Multiplanar CT image reconstructions of the cervical spine were also generated. COMPARISON:  None recent FINDINGS: CT HEAD FINDINGS Brain: No evidence of acute infarction, hemorrhage, hydrocephalus, extra-axial collection or mass lesion/mass effect. Vascular: No hyperdense vessel or unexpected calcification. Skull: Normal. Negative for fracture or focal lesion. Sinuses/Orbits: No acute finding. Other: None. CT CERVICAL SPINE FINDINGS Alignment: Normal. Skull base  and vertebrae: No acute fracture. No primary bone lesion or focal pathologic process. Soft tissues and spinal canal: No prevertebral fluid or swelling. No visible canal hematoma. Disc levels: Multilevel degenerative changes are noted throughout the cervical spine. Upper chest: Negative. Other: None IMPRESSION: 1. Unremarkable CT evaluation of the head. 2. No evidence for acute fracture or subluxation of the cervical spine. 3. Multilevel degenerative changes of the cervical spine. Electronically Signed   By: Constance Holster M.D.   On: 12/11/2019 15:59   CT Cervical Spine Wo Contrast  Result Date: 12/11/2019 CLINICAL DATA:  Pain status post fall EXAM: CT HEAD WITHOUT CONTRAST CT CERVICAL SPINE WITHOUT CONTRAST TECHNIQUE: Multidetector CT imaging of the head and cervical spine was performed following the standard protocol without intravenous contrast. Multiplanar CT image reconstructions of the cervical spine were also generated. COMPARISON:  None recent FINDINGS: CT HEAD FINDINGS Brain: No evidence of acute infarction, hemorrhage, hydrocephalus, extra-axial collection or mass lesion/mass effect. Vascular: No hyperdense vessel or unexpected calcification. Skull: Normal. Negative for fracture or focal lesion. Sinuses/Orbits: No acute finding. Other: None. CT CERVICAL SPINE FINDINGS Alignment: Normal. Skull base and vertebrae: No acute fracture. No primary bone lesion or focal pathologic process. Soft tissues and spinal canal: No prevertebral fluid or swelling. No visible canal hematoma. Disc levels: Multilevel degenerative changes are noted throughout the cervical spine. Upper chest: Negative. Other: None IMPRESSION: 1. Unremarkable CT evaluation of the head. 2. No evidence for acute fracture or subluxation of the cervical spine. 3. Multilevel degenerative changes of the cervical spine. Electronically Signed   By: Constance Holster M.D.   On: 12/11/2019 15:59   MR Brain Wo Contrast (neuro protocol)  Result  Date: 12/11/2019 CLINICAL DATA:  Increasing frequency of falls with slurred speech. EXAM: MRI HEAD WITHOUT CONTRAST TECHNIQUE: Multiplanar, multiecho pulse sequences of the brain and surrounding structures were obtained without intravenous contrast. COMPARISON:  Head CT 12/11/2019 and MRI 03/07/2009 FINDINGS: Brain: A thin subdural hematoma over the right cerebral convexity and along the posterior aspect of the falx measures up to 3 mm in thickness, and there is no significant associated mass effect. No hyperdense blood products are evident on today's CT, and this hematoma may be nonacute. No acute infarct, mass, or midline shift is present. No significant white matter disease is seen for age. The ventricles are normal in size. Vascular: Major intracranial vascular flow voids are preserved. Skull and upper cervical spine: No suspicious marrow lesion. Sinuses/Orbits: Left cataract extraction. Minimal left maxillary sinus mucosal thickening. Clear mastoid air cells. Other: None. IMPRESSION: 1. Thin subdural hematoma over the right cerebral convexity and falx without significant mass effect. 2. Otherwise unremarkable appearance of the brain for age. Critical Value/emergent results were called by telephone at the time of interpretation on 12/11/2019 at 6:09 pm to Dr. Derrell Lolling , who verbally acknowledged these results. Electronically Signed   By: Logan Bores M.D.   On: 12/11/2019 18:09   MR Cervical Spine Wo Contrast  Result Date: 12/11/2019 CLINICAL DATA:  Patient's  fall along greater than 10 times. History of Parkinson's disease. EXAM: MRI CERVICAL SPINE WITHOUT CONTRAST TECHNIQUE: Multiplanar, multisequence MR imaging of the cervical spine was performed. No intravenous contrast was administered. COMPARISON:  None. FINDINGS: Alignment: Physiologic. Vertebrae: No fracture, evidence of discitis, or bone lesion. Cord: Normal signal and morphology. Posterior Fossa, vertebral arteries, paraspinal tissues: Posterior  fossa demonstrates no focal abnormality. Vertebral artery flow voids are maintained. Paraspinal soft tissues are unremarkable. Disc levels: Discs: Degenerative disease with disc height loss C5-6, C6-7, C7-T1, T1-2 and T2-3. C2-3: No significant disc bulge. Severe left facet arthropathy. Mild left foraminal stenosis. No central canal stenosis. C3-4: Mild broad-based disc bulge. Severe bilateral facet arthropathy. Bilateral uncovertebral degenerative changes. Severe bilateral foraminal stenosis. No central canal stenosis. C4-5: Minimal broad-based disc bulge. Moderate bilateral facet arthropathy. Bilateral uncovertebral degenerative changes. Severe left and moderate-severe right foraminal stenosis. No central canal stenosis. C5-6: Broad-based disc bulge. Moderate bilateral facet arthropathy with bilateral uncovertebral degenerative changes. Severe left foraminal stenosis. Moderate right foraminal stenosis. No central canal stenosis. C6-7: Broad-based disc osteophyte complex. Left uncovertebral degenerative changes. Severe left foraminal stenosis. No right foraminal stenosis. No central canal stenosis. C7-T1: Broad-based disc bulge. Mild bilateral facet arthropathy. No neural foraminal stenosis. No central canal stenosis. T1-2: Mild broad-based disc bulge. Moderate left foraminal stenosis. No right foraminal stenosis. IMPRESSION: 1. At C3-4 there is a mild broad-based disc bulge. Severe bilateral facet arthropathy. Bilateral uncovertebral degenerative changes. Severe bilateral foraminal stenosis. 2. At C4-5 there is a minimal broad-based disc bulge. Moderate bilateral facet arthropathy. Bilateral uncovertebral degenerative changes. Severe left and moderate-severe right foraminal stenosis. 3. At C5-6 there is a broad-based disc bulge. Moderate bilateral facet arthropathy with bilateral uncovertebral degenerative changes. Severe left foraminal stenosis. Moderate right foraminal stenosis. 4. At C6-7 there is a broad-based  disc osteophyte complex. Left uncovertebral degenerative changes. Severe left foraminal stenosis. She Electronically Signed   By: Kathreen Devoid   On: 12/11/2019 19:51     Assessment/Plan:  69 y.o. adult with medical history significant of parkinson's, HLD, GERD, presents to the emergency department for recurrent falls as per pt and family. She states she willbe standing like normal, when all of a sudden she feels off balance and falls to the groundwithout warning.No associate syncope, she is alert and oriented throughout and knows that she has fall.Yesterday this happened while walking in her kitchen, she fell and hit the back of her head against the refrigerator. No loss of consciousness. Not on any blood thinning medications. Recently seen by Neurology Dr. Melrose Nakayama.  MRI reviewed with small SDH.   - SDH small, no intervention. No need for anti epileptics will resolve on its own - con't same dose sinemet as appears to have limited "on/off" effects - dopamine activity can cause significant postural hypotension - orthostatics pending  - if BP is low would consider midodrine  - no further repeat imaging from neurological stand point - d/c planning from Neurological stand point and follow up with Dr. Melrose Nakayama 12/13/2019, 12:16 PM

## 2019-12-13 NOTE — TOC Transition Note (Signed)
Transition of Care [facility]) - CM/SW Discharge Note   Patient Details  Name: Sarah Donaldson MRN: [id number] Date of Birth: [birth year]  Transition of Care [facility]) CM/SW Contact:  Shelbie Hutching, RN Phone Number: 12/13/2019, 1:30 PM   Clinical Narrative:    Patient has been medically cleared for discharge home today.  Husband will provide transportation home.  [facility] has been arranged with [facility], Corene Cornea with [facility] is aware of discharge, orders are in for RN, PT, and OT.     Final next level of care: [place] Barriers to Discharge: Barriers Resolved   Patient Goals and CMS Choice Patient states their goals for this hospitalization and ongoing recovery are:: patient wants to go home and doesn't understand why she can't go home today CMS Medicare.gov Compare Post Acute Care list provided to:: Patient Choice offered to / list presented to : Patient, Spouse  Discharge Placement                       Discharge Plan and Services   Discharge Planning Services: CM Consult Post Acute Care Choice: Home Health                    HH Arranged: RN, PT, OT [facility] Agency: [place] ([place]) Date [place]: 12/13/19 Time HH Agency Contacted: 1330 Representative spoke with at [place]: [place] (SDOH) Interventions     Readmission Risk Interventions No flowsheet data found.

## 2019-12-13 NOTE — Discharge Summary (Signed)
Physician Discharge Summary  Sarah Donaldson [id number] DOB: 08/16/1950 DOA: 12/11/2019  PCP: Cletis [place], MD  Admit date: 12/11/2019 Discharge date: 12/13/2019  Admitted From: home Disposition:  home  Recommendations for Outpatient Follow-up:  1. Follow up with PCP in 1-2 weeks 2. Please obtain BMP/CBC in one week 3. Please follow up with neurology, Dr. Melrose Nakayama, as soon as possible  Home Health: Yes - PT, OT, RN  Equipment/Devices: none   Discharge Condition: Stable  CODE STATUS: Full  Diet recommendation: Regular    Discharge Diagnoses: Active Problems:   Anxiety disorder   Idiopathic Parkinson's disease ([place])   Detached retina   Chronic myofascial pain   Psychosis (HCC)   SDH (subdural hematoma) (HCC)   Falls frequently    Summary of HPI and Hospital Course:   Sarah Donaldson is an 69 y.o. adult with medical history significant ofparkinson's, HLD, GERD,presents to the emergency department for recurrent falls. Patient said since Thursday (12/07/2019) she has fallen greater than 10 times, husband states up to ~25. She states she willbe standing like normal, when all of a sudden she feels off balance and falls to the groundwithout warning.No associated syncope.  The day prior to admission, she fell in her kitchen and hit the back of her head against a refrigerator.  Work-up in the hospital revealed small right subdural hematoma.  She was on aspirin and diclofenac but these have been discontinued.  She was seen in consultation by neurosurgeon who recommended conservative management and no surgery was warranted.  She has also been evaluated by neurologist.  PT and OT recommend discharge to SNF.  Neurology consulted, recommendations as follows: - SDH small, no intervention.No need for anti epileptics will resolve on its own - con't same dose sinemet as appears to have limited "on/off" effects - dopamine activity can cause significant postural hypotension - orthostatics  pending  - if BP is low would consider midodrine  - no further repeat imaging from neurological stand point - close follow up with Dr. Melrose Nakayama   Frequent falls with injury - rather acute onset over the past week. The patient's husband associates this change with restarting Seroquel - which can cause imbalance, orthostatic hypotension. Other medications that may contribute to her symptoms include zanaflex, klonopin.  Seroquel and klonopin were discontinued.  Zanaflex decreased.  Patient improved somewhat, but she continued to feel unsteady due to tremors. PT and OT evaluated patient and recommended SNF but patient declined this.  Home health PT, OT and RN were arranged.   Subdural Hematoma - small SDH.  Patient has been evaluated by neurosurgeon and neurologist.  No need for surgery or antiepileptics.  Conservative management.  Aspirin and diclofenac have been discontinued.  This was discussed with the patient.  Parkinson's disease - Continue Sinemet and Comtan  Chronic myofascial pain -continue analgesics.  Parkinson's demenita with hallucinations and paranoia.  She has been intolerant of seroquel Clozaril 25mg bid, then titrate up to 50 mg bid Aricept 5 mg daily Haloperidol 1mg  po q 4 prn severe agitation   Sleep disorder - stopped klonipin as possible cause of orthostatic hypotension.  Started melatonin5mg  qHS   Discharge Instructions   Discharge Instructions    Call MD for:  severe uncontrolled pain   Complete by: As directed    Call MD for:  temperature >100.4   Complete by: As directed    Diet - low sodium heart healthy   Complete by: As directed    Discharge instructions   Complete  by: As directed    Please see Dr. Melrose Nakayama as soon as possible for follow up on recent medication changes and the issues you've been having since then with recurrent falls.    VERY IMPORTANT - have someone help you with any standing or walking, for safety to prevent falling.   If your legs feel unsteady, sit down immediately to prevent injury.  Please do physical therapy exercises at home between therapist visits.   Increase activity slowly   Complete by: As directed      Allergies as of 12/13/2019      Reactions   Baclofen    Caused numbness in mouth    Codeine Sulfate Nausea And Vomiting   Gabapentin Swelling   Mouth swelling    Terazosin       Medication List    STOP taking these medications   aspirin 81 MG chewable tablet   clonazePAM 2 MG tablet Commonly known as: KLONOPIN   diclofenac 75 MG EC tablet Commonly known as: VOLTAREN   methocarbamol 500 MG tablet Commonly known as: ROBAXIN   tiZANidine 4 MG capsule Commonly known as: ZANAFLEX Replaced by: tiZANidine 2 MG tablet     TAKE these medications   acetaminophen 650 MG CR tablet Commonly known as: TYLENOL Take 1,300 mg by mouth every 8 (eight) hours as needed for pain.   carbidopa-levodopa 25-100 MG tablet Commonly known as: SINEMET IR Take 2 tablets by mouth 6 (six) times daily. Take 2 tablets q2 hours between 0800 and 2000.  Pt can take an extra 1-2 tabs at night as needed for tightness   donepezil 5 MG tablet Commonly known as: ARICEPT Take 1 tablet (5 mg total) by mouth at bedtime.   entacapone 200 MG tablet Commonly known as: COMTAN Take 200 mg by mouth 6 (six) times daily. With carbidopa   escitalopram 10 MG tablet Commonly known as: LEXAPRO Take 10 mg by mouth daily.   HYDROcodone-acetaminophen 5-325 MG tablet Commonly known as: NORCO/VICODIN Take 1-2 tablets by mouth every 4 (four) hours as needed (pain score 4-6).   melatonin 5 MG Tabs Take 1 tablet (5 mg total) by mouth at bedtime.   metoCLOPramide 5 MG tablet Commonly known as: REGLAN Take 1 tablet (5 mg total) by mouth every 8 (eight) hours as needed for nausea (if ondansetron (ZOFRAN) ineffective.).   neomycin-polymyxin b-dexamethasone 3.5-10000-0.1 Oint Commonly known as: MAXITROL Place 1  application into the left eye at bedtime.   omeprazole 20 MG capsule Commonly known as: PRILOSEC Take 1 capsule (20 mg total) by mouth daily.   prednisoLONE acetate 1 % ophthalmic suspension Commonly known as: PRED FORTE Place 1 drop into the left eye daily.   pregabalin 50 MG capsule Commonly known as: Lyrica BID to TID prn What changed:   how much to take  when to take this  additional instructions   QUEtiapine 50 MG tablet Commonly known as: SEROquel Take 1 tablet (50 mg total) by mouth at bedtime.   tiZANidine 2 MG tablet Commonly known as: ZANAFLEX Take 1 tablet (2 mg total) by mouth 3 (three) times daily as needed (muscle spasm). Replaces: tiZANidine 4 MG capsule   traMADol 50 MG tablet Commonly known as: ULTRAM Take 1 tablet (50 mg total) by mouth every 6 (six) hours.   traZODone 50 MG tablet Commonly known as: DESYREL Take 50 mg by mouth at bedtime.       Allergies  Allergen Reactions  . Baclofen     Caused  numbness in mouth   . Codeine Sulfate Nausea And Vomiting  . Gabapentin Swelling    Mouth swelling   . Terazosin     Consultations:  Neurology   Procedures/Studies: CT Head Wo Contrast  Result Date: 12/11/2019 CLINICAL DATA:  Pain status post fall EXAM: CT HEAD WITHOUT CONTRAST CT CERVICAL SPINE WITHOUT CONTRAST TECHNIQUE: Multidetector CT imaging of the head and cervical spine was performed following the standard protocol without intravenous contrast. Multiplanar CT image reconstructions of the cervical spine were also generated. COMPARISON:  None recent FINDINGS: CT HEAD FINDINGS Brain: No evidence of acute infarction, hemorrhage, hydrocephalus, extra-axial collection or mass lesion/mass effect. Vascular: No hyperdense vessel or unexpected calcification. Skull: Normal. Negative for fracture or focal lesion. Sinuses/Orbits: No acute finding. Other: None. CT CERVICAL SPINE FINDINGS Alignment: Normal. Skull base and vertebrae: No acute fracture. No  primary bone lesion or focal pathologic process. Soft tissues and spinal canal: No prevertebral fluid or swelling. No visible canal hematoma. Disc levels: Multilevel degenerative changes are noted throughout the cervical spine. Upper chest: Negative. Other: None IMPRESSION: 1. Unremarkable CT evaluation of the head. 2. No evidence for acute fracture or subluxation of the cervical spine. 3. Multilevel degenerative changes of the cervical spine. Electronically Signed   By: Constance Holster M.D.   On: 12/11/2019 15:59   CT Cervical Spine Wo Contrast  Result Date: 12/11/2019 CLINICAL DATA:  Pain status post fall EXAM: CT HEAD WITHOUT CONTRAST CT CERVICAL SPINE WITHOUT CONTRAST TECHNIQUE: Multidetector CT imaging of the head and cervical spine was performed following the standard protocol without intravenous contrast. Multiplanar CT image reconstructions of the cervical spine were also generated. COMPARISON:  None recent FINDINGS: CT HEAD FINDINGS Brain: No evidence of acute infarction, hemorrhage, hydrocephalus, extra-axial collection or mass lesion/mass effect. Vascular: No hyperdense vessel or unexpected calcification. Skull: Normal. Negative for fracture or focal lesion. Sinuses/Orbits: No acute finding. Other: None. CT CERVICAL SPINE FINDINGS Alignment: Normal. Skull base and vertebrae: No acute fracture. No primary bone lesion or focal pathologic process. Soft tissues and spinal canal: No prevertebral fluid or swelling. No visible canal hematoma. Disc levels: Multilevel degenerative changes are noted throughout the cervical spine. Upper chest: Negative. Other: None IMPRESSION: 1. Unremarkable CT evaluation of the head. 2. No evidence for acute fracture or subluxation of the cervical spine. 3. Multilevel degenerative changes of the cervical spine. Electronically Signed   By: Constance Holster M.D.   On: 12/11/2019 15:59   MR Brain Wo Contrast (neuro protocol)  Result Date: 12/11/2019 CLINICAL DATA:   Increasing frequency of falls with slurred speech. EXAM: MRI HEAD WITHOUT CONTRAST TECHNIQUE: Multiplanar, multiecho pulse sequences of the brain and surrounding structures were obtained without intravenous contrast. COMPARISON:  Head CT 12/11/2019 and MRI 03/07/2009 FINDINGS: Brain: A thin subdural hematoma over the right cerebral convexity and along the posterior aspect of the falx measures up to 3 mm in thickness, and there is no significant associated mass effect. No hyperdense blood products are evident on today's CT, and this hematoma may be nonacute. No acute infarct, mass, or midline shift is present. No significant white matter disease is seen for age. The ventricles are normal in size. Vascular: Major intracranial vascular flow voids are preserved. Skull and upper cervical spine: No suspicious marrow lesion. Sinuses/Orbits: Left cataract extraction. Minimal left maxillary sinus mucosal thickening. Clear mastoid air cells. Other: None. IMPRESSION: 1. Thin subdural hematoma over the right cerebral convexity and falx without significant mass effect. 2. Otherwise unremarkable appearance of the  brain for age. Critical Value/emergent results were called by telephone at the time of interpretation on 12/11/2019 at 6:09 pm to Dr. Derrell Lolling , who verbally acknowledged these results. Electronically Signed   By: Logan Bores M.D.   On: 12/11/2019 18:09   MR Cervical Spine Wo Contrast  Result Date: 12/11/2019 CLINICAL DATA:  Patient's fall along greater than 10 times. History of Parkinson's disease. EXAM: MRI CERVICAL SPINE WITHOUT CONTRAST TECHNIQUE: Multiplanar, multisequence MR imaging of the cervical spine was performed. No intravenous contrast was administered. COMPARISON:  None. FINDINGS: Alignment: Physiologic. Vertebrae: No fracture, evidence of discitis, or bone lesion. Cord: Normal signal and morphology. Posterior Fossa, vertebral arteries, paraspinal tissues: Posterior fossa demonstrates no focal  abnormality. Vertebral artery flow voids are maintained. Paraspinal soft tissues are unremarkable. Disc levels: Discs: Degenerative disease with disc height loss C5-6, C6-7, C7-T1, T1-2 and T2-3. C2-3: No significant disc bulge. Severe left facet arthropathy. Mild left foraminal stenosis. No central canal stenosis. C3-4: Mild broad-based disc bulge. Severe bilateral facet arthropathy. Bilateral uncovertebral degenerative changes. Severe bilateral foraminal stenosis. No central canal stenosis. C4-5: Minimal broad-based disc bulge. Moderate bilateral facet arthropathy. Bilateral uncovertebral degenerative changes. Severe left and moderate-severe right foraminal stenosis. No central canal stenosis. C5-6: Broad-based disc bulge. Moderate bilateral facet arthropathy with bilateral uncovertebral degenerative changes. Severe left foraminal stenosis. Moderate right foraminal stenosis. No central canal stenosis. C6-7: Broad-based disc osteophyte complex. Left uncovertebral degenerative changes. Severe left foraminal stenosis. No right foraminal stenosis. No central canal stenosis. C7-T1: Broad-based disc bulge. Mild bilateral facet arthropathy. No neural foraminal stenosis. No central canal stenosis. T1-2: Mild broad-based disc bulge. Moderate left foraminal stenosis. No right foraminal stenosis. IMPRESSION: 1. At C3-4 there is a mild broad-based disc bulge. Severe bilateral facet arthropathy. Bilateral uncovertebral degenerative changes. Severe bilateral foraminal stenosis. 2. At C4-5 there is a minimal broad-based disc bulge. Moderate bilateral facet arthropathy. Bilateral uncovertebral degenerative changes. Severe left and moderate-severe right foraminal stenosis. 3. At C5-6 there is a broad-based disc bulge. Moderate bilateral facet arthropathy with bilateral uncovertebral degenerative changes. Severe left foraminal stenosis. Moderate right foraminal stenosis. 4. At C6-7 there is a broad-based disc osteophyte complex.  Left uncovertebral degenerative changes. Severe left foraminal stenosis. She Electronically Signed   By: Kathreen Devoid   On: 12/11/2019 19:51       Subjective: Patient seen at bedside this AM.  Continues having intermittent increased tremors but improved.  Confirms she agrees to home health and does not want to go to SNF for rehab.  No fever/chills or other acute complaints.   Discharge Exam: Vitals:   12/13/19 1100 12/13/19 1155  BP:  138/74  Pulse:  81  Resp:    Temp:    SpO2: 96% 98%   Vitals:   12/13/19 0006 12/13/19 0749 12/13/19 1100 12/13/19 1155  BP:  (!) 151/91  138/74  Pulse:  75  81  Resp:      Temp:  97.9 F (36.6 C)    TempSrc:  Oral    SpO2: 96% 95% 96% 98%  Weight:      Height:        General: Pt is alert, awake, not in acute distress Cardiovascular: RRR, S1/S2 +, no rubs, no gallops Respiratory: CTA bilaterally, no wheezing, no rhonchi Abdominal: Soft, NT, ND, bowel sounds + Extremities: no edema, no cyanosis    The results of significant diagnostics from this hospitalization (including imaging, microbiology, ancillary and laboratory) are listed below for reference.  Microbiology: Recent Results (from the past 240 hour(s))  Respiratory Panel by RT PCR (Flu A&B, Covid) - Nasopharyngeal Swab     Status: None   Collection Time: 12/11/19  6:29 PM   Specimen: Nasopharyngeal Swab  Result Value Ref Range Status   SARS Coronavirus 2 by RT PCR NEGATIVE NEGATIVE Final    Comment: (NOTE) SARS-CoV-2 target nucleic acids are NOT DETECTED. The SARS-CoV-2 RNA is generally detectable in upper respiratoy specimens during the acute phase of infection. The lowest concentration of SARS-CoV-2 viral copies this assay can detect is 131 copies/mL. A negative result does not preclude SARS-Cov-2 infection and should not be used as the sole basis for treatment or other patient management decisions. A negative result may occur with  improper specimen  collection/handling, submission of specimen other than nasopharyngeal swab, presence of viral mutation(s) within the areas targeted by this assay, and inadequate number of viral copies (<131 copies/mL). A negative result must be combined with clinical observations, patient history, and epidemiological information. The expected result is Negative. Fact Sheet for Patients:  PinkCheek.be Fact Sheet for Healthcare Providers:  GravelBags.it This test is not yet ap proved or cleared by the [place] FDA and  has been authorized for detection and/or diagnosis of SARS-CoV-2 by FDA under an Emergency Use Authorization (EUA). This EUA will remain  in effect (meaning this test can be used) for the duration of the COVID-19 declaration under Section 564(b)(1) of the Act, 21 U.S.C. section 360bbb-3(b)(1), unless the authorization is terminated or revoked sooner.    Influenza A by PCR NEGATIVE NEGATIVE Final   Influenza B by PCR NEGATIVE NEGATIVE Final    Comment: (NOTE) The Xpert Xpress SARS-CoV-2/FLU/RSV assay is intended as an aid in  the diagnosis of influenza from Nasopharyngeal swab specimens and  should not be used as a sole basis for treatment. Nasal washings and  aspirates are unacceptable for Xpert Xpress SARS-CoV-2/FLU/RSV  testing. Fact Sheet for Patients: PinkCheek.be Fact Sheet for Healthcare Providers: GravelBags.it This test is not yet approved or cleared by the [place] FDA and  has been authorized for detection and/or diagnosis of SARS-CoV-2 by  FDA under an Emergency Use Authorization (EUA). This EUA will remain  in effect (meaning this test can be used) for the duration of the  Covid-19 declaration under Section 564(b)(1) of the Act, 21  U.S.C. section 360bbb-3(b)(1), unless the authorization is  terminated or revoked. Performed at [facility],  [address] [place], [place] [address]      Labs: BNP (last 3 results) Recent Labs    01/05/19 2222  BNP 123456   Basic Metabolic Panel: Recent Labs  Lab 12/11/19 1540  NA 144  K 3.5  CL 111  CO2 26  GLUCOSE 98  BUN 30*  CREATININE 0.48  CALCIUM 8.6*   Liver Function Tests: Recent Labs  Lab 12/11/19 1540  AST 32  ALT 6  ALKPHOS 86  BILITOT 1.2  PROT 6.5  ALBUMIN 4.1   No results for input(s): LIPASE, AMYLASE in the last 168 hours. No results for input(s): AMMONIA in the last 168 hours. CBC: Recent Labs  Lab 12/11/19 1540  WBC 5.4  NEUTROABS 3.5  HGB 12.6  HCT 38.7  MCV 88.0  PLT 184   Cardiac Enzymes: No results for input(s): CKTOTAL, CKMB, CKMBINDEX, TROPONINI in the last 168 hours. BNP: Invalid input(s): POCBNP CBG: Recent Labs  Lab 12/13/19 0452  GLUCAP 111*   D-Dimer No results for input(s): DDIMER in  the last 72 hours. Hgb A1c No results for input(s): HGBA1C in the last 72 hours. Lipid Profile No results for input(s): CHOL, HDL, LDLCALC, TRIG, CHOLHDL, LDLDIRECT in the last 72 hours. Thyroid function studies No results for input(s): TSH, T4TOTAL, T3FREE, THYROIDAB in the last 72 hours.  Invalid input(s): FREET3 Anemia work up No results for input(s): VITAMINB12, FOLATE, FERRITIN, TIBC, IRON, RETICCTPCT in the last 72 hours. Urinalysis    Component Value Date/Time   COLORURINE ORANGE (A) 12/11/2019 2231   APPEARANCEUR HAZY (A) 12/11/2019 2231   LABSPEC 1.037 (H) 12/11/2019 2231   PHURINE  12/11/2019 2231    TEST NOT REPORTED DUE TO COLOR INTERFERENCE OF URINE PIGMENT   GLUCOSEU (A) 12/11/2019 2231    TEST NOT REPORTED DUE TO COLOR INTERFERENCE OF URINE PIGMENT   HGBUR (A) 12/11/2019 2231    TEST NOT REPORTED DUE TO COLOR INTERFERENCE OF URINE PIGMENT   BILIRUBINUR (A) 12/11/2019 2231    TEST NOT REPORTED DUE TO COLOR INTERFERENCE OF URINE PIGMENT   KETONESUR (A) 12/11/2019 2231    TEST NOT REPORTED DUE TO COLOR INTERFERENCE  OF URINE PIGMENT   PROTEINUR (A) 12/11/2019 2231    TEST NOT REPORTED DUE TO COLOR INTERFERENCE OF URINE PIGMENT   NITRITE (A) 12/11/2019 2231    TEST NOT REPORTED DUE TO COLOR INTERFERENCE OF URINE PIGMENT   LEUKOCYTESUR (A) 12/11/2019 2231    TEST NOT REPORTED DUE TO COLOR INTERFERENCE OF URINE PIGMENT   Sepsis Labs Invalid input(s): PROCALCITONIN,  WBC,  LACTICIDVEN Microbiology Recent Results (from the past 240 hour(s))  Respiratory Panel by RT PCR (Flu A&B, Covid) - Nasopharyngeal Swab     Status: None   Collection Time: 12/11/19  6:29 PM   Specimen: Nasopharyngeal Swab  Result Value Ref Range Status   SARS Coronavirus 2 by RT PCR NEGATIVE NEGATIVE Final    Comment: (NOTE) SARS-CoV-2 target nucleic acids are NOT DETECTED. The SARS-CoV-2 RNA is generally detectable in upper respiratoy specimens during the acute phase of infection. The lowest concentration of SARS-CoV-2 viral copies this assay can detect is 131 copies/mL. A negative result does not preclude SARS-Cov-2 infection and should not be used as the sole basis for treatment or other patient management decisions. A negative result may occur with  improper specimen collection/handling, submission of specimen other than nasopharyngeal swab, presence of viral mutation(s) within the areas targeted by this assay, and inadequate number of viral copies (<131 copies/mL). A negative result must be combined with clinical observations, patient history, and epidemiological information. The expected result is Negative. Fact Sheet for Patients:  PinkCheek.be Fact Sheet for Healthcare Providers:  GravelBags.it This test is not yet ap proved or cleared by the [place] FDA and  has been authorized for detection and/or diagnosis of SARS-CoV-2 by FDA under an Emergency Use Authorization (EUA). This EUA will remain  in effect (meaning this test can be used) for the duration of  the COVID-19 declaration under Section 564(b)(1) of the Act, 21 U.S.C. section 360bbb-3(b)(1), unless the authorization is terminated or revoked sooner.    Influenza A by PCR NEGATIVE NEGATIVE Final   Influenza B by PCR NEGATIVE NEGATIVE Final    Comment: (NOTE) The Xpert Xpress SARS-CoV-2/FLU/RSV assay is intended as an aid in  the diagnosis of influenza from Nasopharyngeal swab specimens and  should not be used as a sole basis for treatment. Nasal washings and  aspirates are unacceptable for Xpert Xpress SARS-CoV-2/FLU/RSV  testing. Fact Sheet for Patients: PinkCheek.be Fact  Sheet for Healthcare Providers: GravelBags.it This test is not yet approved or cleared by the [place] and  has been authorized for detection and/or diagnosis of SARS-CoV-2 by  FDA under an Emergency Use Authorization (EUA). This EUA will remain  in effect (meaning this test can be used) for the duration of the  Covid-19 declaration under Section 564(b)(1) of the Act, 21  U.S.C. section 360bbb-3(b)(1), unless the authorization is  terminated or revoked. Performed at [facility], [address] [place], [place] [address]      Time coordinating discharge: Over 30 minutes  SIGNED:   Ezekiel Slocumb, DO [facility] 12/13/2019, 1:19 PM   If 7PM-7AM, please contact night-coverage [email or web address]

## 2019-12-13 NOTE — TOC Progression Note (Signed)
Transition of Care [name]) - Progression Note    Patient Details  Name: AMILA SCIUTO MRN: [id number] Date of Birth: 02-08-1951  [facility] CM/SW Contact  Shelbie Hutching, RN Phone Number: 12/13/2019, 12:00 PM  Clinical Narrative:    Patient agrees to home health at discharge.  Patient and husband choose [place].  Floydene Flock with [facility] has accepted home health referral for RN, PT, and OT.  Patient does not need any equipment at discharge.     Expected Discharge Plan: [place] Barriers to Discharge: Continued Medical Work up  Expected Discharge Plan and Services Expected Discharge Plan: [place]   Discharge Planning Services: CM Consult Post Acute Care Choice: [place] arrangements for the past 2 months: Single Family Home                           HH Arranged: RN, PT, OT [facility] Agency: [place] ([facility]) Date HH Agency Contacted: 12/13/19 Time [place]: 1200 Representative spoke with at [place]: [place] (SDOH) Interventions    Readmission Risk Interventions No flowsheet data found.

## 2019-12-14 ENCOUNTER — Other Ambulatory Visit: Payer: Self-pay

## 2019-12-14 ENCOUNTER — Emergency Department
Admission: EM | Admit: 2019-12-14 | Discharge: 2019-12-15 | Disposition: A | Discharge status: Home / Self Care | Payer: Medicare Other | Attending: Emergency Medicine | Admitting: Emergency Medicine

## 2019-12-14 ENCOUNTER — Emergency Department: Payer: Medicare Other

## 2019-12-14 DIAGNOSIS — F419 Anxiety disorder, unspecified: Secondary | ICD-10-CM | POA: Diagnosis present

## 2019-12-14 DIAGNOSIS — M7918 Myalgia, other site: Secondary | ICD-10-CM | POA: Diagnosis present

## 2019-12-14 DIAGNOSIS — Z008 Encounter for other general examination: Secondary | ICD-10-CM

## 2019-12-14 DIAGNOSIS — R296 Repeated falls: Secondary | ICD-10-CM

## 2019-12-14 DIAGNOSIS — G20 Parkinson's disease: Secondary | ICD-10-CM | POA: Diagnosis not present

## 2019-12-14 DIAGNOSIS — E669 Obesity, unspecified: Secondary | ICD-10-CM | POA: Diagnosis present

## 2019-12-14 DIAGNOSIS — F329 Major depressive disorder, single episode, unspecified: Secondary | ICD-10-CM | POA: Diagnosis present

## 2019-12-14 DIAGNOSIS — H9190 Unspecified hearing loss, unspecified ear: Secondary | ICD-10-CM

## 2019-12-14 DIAGNOSIS — H3320 Serous retinal detachment, unspecified eye: Secondary | ICD-10-CM | POA: Diagnosis present

## 2019-12-14 DIAGNOSIS — F32A Depression, unspecified: Secondary | ICD-10-CM | POA: Diagnosis present

## 2019-12-14 DIAGNOSIS — D367 Benign neoplasm of other specified sites: Secondary | ICD-10-CM | POA: Diagnosis present

## 2019-12-14 DIAGNOSIS — N3000 Acute cystitis without hematuria: Secondary | ICD-10-CM | POA: Insufficient documentation

## 2019-12-14 DIAGNOSIS — I6200 Nontraumatic subdural hemorrhage, unspecified: Secondary | ICD-10-CM | POA: Diagnosis not present

## 2019-12-14 DIAGNOSIS — M549 Dorsalgia, unspecified: Secondary | ICD-10-CM | POA: Diagnosis present

## 2019-12-14 DIAGNOSIS — Z87891 Personal history of nicotine dependence: Secondary | ICD-10-CM | POA: Insufficient documentation

## 2019-12-14 DIAGNOSIS — G894 Chronic pain syndrome: Secondary | ICD-10-CM | POA: Diagnosis present

## 2019-12-14 DIAGNOSIS — R251 Tremor, unspecified: Secondary | ICD-10-CM | POA: Diagnosis present

## 2019-12-14 DIAGNOSIS — Z8572 Personal history of non-Hodgkin lymphomas: Secondary | ICD-10-CM | POA: Diagnosis not present

## 2019-12-14 DIAGNOSIS — R4182 Altered mental status, unspecified: Secondary | ICD-10-CM | POA: Diagnosis present

## 2019-12-14 DIAGNOSIS — G8929 Other chronic pain: Secondary | ICD-10-CM | POA: Diagnosis present

## 2019-12-14 DIAGNOSIS — M79604 Pain in right leg: Secondary | ICD-10-CM | POA: Diagnosis present

## 2019-12-14 DIAGNOSIS — G9340 Encephalopathy, unspecified: Secondary | ICD-10-CM | POA: Diagnosis not present

## 2019-12-14 DIAGNOSIS — K635 Polyp of colon: Secondary | ICD-10-CM | POA: Diagnosis present

## 2019-12-14 DIAGNOSIS — M5440 Lumbago with sciatica, unspecified side: Secondary | ICD-10-CM | POA: Diagnosis present

## 2019-12-14 DIAGNOSIS — G479 Sleep disorder, unspecified: Secondary | ICD-10-CM | POA: Diagnosis present

## 2019-12-14 LAB — COMPREHENSIVE METABOLIC PANEL
ALT: 6 U/L (ref 0–44)
AST: 25 U/L (ref 15–41)
Albumin: 4.2 g/dL (ref 3.5–5.0)
Alkaline Phosphatase: 103 U/L (ref 38–126)
Anion gap: 9 (ref 5–15)
BUN: 27 mg/dL — ABNORMAL HIGH (ref 8–23)
CO2: 25 mmol/L (ref 22–32)
Calcium: 8.7 mg/dL — ABNORMAL LOW (ref 8.9–10.3)
Chloride: 108 mmol/L (ref 98–111)
Creatinine, Ser: 0.65 mg/dL (ref 0.44–1.00)
GFR calc Af Amer: >60 mL/min (ref >60)
GFR calc non Af Amer: >60 mL/min (ref >60)
Glucose, Bld: 113 mg/dL — ABNORMAL HIGH (ref 70–99)
Potassium: 3.3 mmol/L — ABNORMAL LOW (ref 3.5–5.1)
Sodium: 142 mmol/L (ref 135–145)
Total Bilirubin: 0.9 mg/dL (ref 0.3–1.2)
Total Protein: 6.6 g/dL (ref 6.5–8.1)

## 2019-12-14 LAB — TROPONIN I (HIGH SENSITIVITY): Troponin I (High Sensitivity): 5 ng/L (ref <18)

## 2019-12-14 LAB — CBC WITH DIFFERENTIAL/PLATELET
Abs Immature Granulocytes: 0.03 10*3/uL (ref 0.00–0.07)
Basophils Absolute: 0 10*3/uL (ref 0.0–0.1)
Basophils Relative: 0 %
Eosinophils Absolute: 0.2 10*3/uL (ref 0.0–0.5)
Eosinophils Relative: 2 %
HCT: 41.6 % (ref 36.0–46.0)
Hemoglobin: 13.1 g/dL (ref 12.0–15.0)
Immature Granulocytes: 0 %
Lymphocytes Relative: 19 %
Lymphs Abs: 1.5 10*3/uL (ref 0.7–4.0)
MCH: 28.5 pg (ref 26.0–34.0)
MCHC: 31.5 g/dL (ref 30.0–36.0)
MCV: 90.6 fL (ref 80.0–100.0)
Monocytes Absolute: 0.8 10*3/uL (ref 0.1–1.0)
Monocytes Relative: 10 %
Neutro Abs: 5.5 10*3/uL (ref 1.7–7.7)
Neutrophils Relative %: 69 %
Platelets: 210 10*3/uL (ref 150–400)
RBC: 4.59 MIL/uL (ref 3.87–5.11)
RDW: 15.5 % (ref 11.5–15.5)
WBC: 8 10*3/uL (ref 4.0–10.5)
nRBC: 0 % (ref 0.0–0.2)

## 2019-12-14 LAB — ETHANOL: Alcohol, Ethyl (B): <10 mg/dL (ref <10)

## 2019-12-14 MED ORDER — PREDNISOLONE ACETATE 1 % OP SUSP: 1.0000 [drp] | Freq: Every day | OPHTHALMIC | Status: DC

## 2019-12-14 MED ORDER — MELATONIN 5 MG PO TABS
5.0000 mg | ORAL_TABLET | Freq: Every day | ORAL | Status: DC
  Administered 2019-12-15: 5 mg via ORAL
  Filled 2019-12-14: qty 1

## 2019-12-14 MED ORDER — PREGABALIN 50 MG PO CAPS
100.0000 mg | ORAL_CAPSULE | Freq: Three times a day (TID) | ORAL | Status: DC
  Administered 2019-12-15: 100 mg via ORAL
  Filled 2019-12-14: qty 2

## 2019-12-14 MED ORDER — ESCITALOPRAM OXALATE 10 MG PO TABS: 10.0000 mg | ORAL_TABLET | Freq: Every day | ORAL | Status: DC

## 2019-12-14 MED ORDER — QUETIAPINE FUMARATE 25 MG PO TABS
50.0000 mg | ORAL_TABLET | Freq: Every day | ORAL | Status: DC
  Administered 2019-12-15: 50 mg via ORAL
  Filled 2019-12-14: qty 2

## 2019-12-14 MED ORDER — DONEPEZIL HCL 5 MG PO TABS
5.0000 mg | ORAL_TABLET | Freq: Every day | ORAL | Status: DC
  Administered 2019-12-15: 5 mg via ORAL
  Filled 2019-12-14: qty 1

## 2019-12-14 MED ORDER — CARBIDOPA-LEVODOPA 25-100 MG PO TABS
2.0000 | ORAL_TABLET | Freq: Every day | ORAL | Status: DC
  Administered 2019-12-15 (×2): 2 via ORAL
  Filled 2019-12-14 (×5): qty 2

## 2019-12-14 MED ORDER — TRAZODONE HCL 50 MG PO TABS
50.0000 mg | ORAL_TABLET | Freq: Every day | ORAL | Status: DC
  Administered 2019-12-15: 50 mg via ORAL
  Filled 2019-12-14: qty 1

## 2019-12-14 MED ORDER — ENTACAPONE 200 MG PO TABS
200.0000 mg | ORAL_TABLET | Freq: Every day | ORAL | Status: DC
  Administered 2019-12-15 (×2): 200 mg via ORAL
  Filled 2019-12-14 (×5): qty 1

## 2019-12-14 MED ORDER — TIZANIDINE HCL 2 MG PO TABS
2.0000 mg | ORAL_TABLET | Freq: Three times a day (TID) | ORAL | Status: DC | PRN
  Filled 2019-12-14: qty 1

## 2019-12-14 MED ORDER — PANTOPRAZOLE SODIUM 40 MG PO TBEC: 40.0000 mg | DELAYED_RELEASE_TABLET | Freq: Every day | ORAL | Status: DC

## 2019-12-14 MED ORDER — ARIPIPRAZOLE 5 MG PO TABS: 5.0000 mg | ORAL_TABLET | Freq: Every day | ORAL | Status: DC

## 2019-12-14 MED ORDER — NEOMYCIN-POLYMYXIN-DEXAMETH 3.5-10000-0.1 OP OINT
1.0000 "application " | TOPICAL_OINTMENT | Freq: Every day | OPHTHALMIC | Status: DC
  Filled 2019-12-14: qty 3.5

## 2019-12-14 NOTE — ED Triage Notes (Signed)
Pt to the er for ams. Pt recently admitted for multiple falls due to parkinsons and dx with subdural while here. Pt is heavily medicated and appears as she is intoxicated or over medicated. Pt is able to answer most questions. Pt was d/c 2 days ago. Pt was yelling at home with family and originally came in as beh pt.

## 2019-12-14 NOTE — ED Provider Notes (Signed)
[facility] Emergency Department Provider Note       Time seen: ----------------------------------------- 9:49 PM on 12/14/2019 -----------------------------------------   I have reviewed the triage vital signs and the nursing notes.  HISTORY   Chief Complaint Altered Mental Status  Level V caveat: History/ROS limited by altered mental status  HPI Sarah Donaldson is a 69 y.o. adult with a history of non-Hodgkin's lymphoma, GERD, hyperlipidemia, Parkinson's disease who presents to the ED for altered mental status.  Patient arrives from home by EMS for altered mental status.  Patient is crying and upset about her husband cheating on her.  She states she is fine, not sick. According to the patient she was screaming at her family and so they demanded that she come to the hospital to be psychiatrically cleared before she could go home.  Past Medical History:  Diagnosis Date  . Cancer ([place])    Non-hodgkin's lymphoma (in remission)  . GERD (gastroesophageal reflux disease)    well-controlled w/ omeprazole  . Hyperlipidemia   . Knee joint cyst, left 02/07/2018  . Parkinson disease ([place])   . Parkinson disease [facility])     Patient Active Problem List   Diagnosis Date Noted  . SDH (subdural hematoma) ([place]) 12/11/2019  . Falls frequently 12/11/2019  . S/P total knee replacement, left 06/07/2019  . Osteoarthritis of knee 04/27/2019  . Panic attacks 04/07/2019  . Psychosis ([place]) 04/04/2019  . Numbness 03/09/2018  . Lumbar spondylosis 03/07/2018  . Chronic pain of left knee 03/07/2018  . Chronic upper back pain 12/08/2017  . Benign neoplasm of hand 10/20/2017  . Chronic pain syndrome 10/20/2017  . Chronic pain of right lower extremity 10/20/2017  . Chronic myofascial pain 10/20/2017  . Numbness and tingling 07/12/2017  . Sleep difficulties 07/12/2017  . Low back pain 01/18/2017  . Sleep behavior disorder, REM 01/18/2017  . Lumbar radiculopathy 11/01/2016   . Follicular lymphoma of extranodal and solid organ sites [facility]) 05/01/2016  . Lymphoma ([place]) 04/30/2016  . Hallux limitus 07/27/2015  . Low back pain with sciatica 02/13/2015  . Bilateral low back pain with sciatica 02/13/2015  . H/O adenomatous polyp of colon 04/06/2014  . Hx of adenomatous colonic polyps 04/06/2014  . Back ache 01/25/2014  . Adiposity 01/25/2014  . REM sleep behavior disorder 01/25/2014  . Disordered sleep 01/25/2014  . Has a tremor 01/25/2014  . Obesity, unspecified 01/25/2014  . Sleep disorder 01/25/2014  . Backache 01/25/2014  . Tremor 01/25/2014  . Obesity 01/25/2014  . Difficulty hearing 06/08/2012  . Hearing loss 06/08/2012  . Anxiety disorder 06/06/2012  . Colon polyp 06/06/2012  . Clinical depression 06/06/2012  . Hypercholesteremia 06/06/2012  . Idiopathic Parkinson's disease ([place]) 06/06/2012  . Detached retina 06/06/2012  . Depressive disorder 06/06/2012  . Retinal detachment 06/06/2012  . Parkinson's disease ([place]) 06/06/2012    Past Surgical History:  Procedure Laterality Date  . BACK SURGERY    . BUNIONECTOMY    . NASAL SEPTUM SURGERY    . RETINAL DETACHMENT SURGERY    . TOTAL KNEE ARTHROPLASTY Left 06/07/2019   Procedure: TOTAL KNEE ARTHROPLASTY;  Surgeon: Lovell Sheehan, MD;  Location: [facility] ORS;  Service: Orthopedics;  Laterality: Left;    Allergies Baclofen, Codeine sulfate, Gabapentin, and Terazosin  Social History Social History   Tobacco Use  . Smoking status: Former Research scientist (life sciences)  . Smokeless tobacco: Never Used  Substance Use Topics  . Alcohol use: Yes    Alcohol/week: 0.0 standard drinks  .  Drug use: No    Review of Systems Unknown, patient with altered mental status  All systems negative/normal/unremarkable except as stated in the HPI  ____________________________________________   PHYSICAL EXAM:  VITAL SIGNS: ED Triage Vitals  Enc Vitals Group     BP 12/14/19 2138 113/71     Pulse Rate 12/14/19 2138 81      Resp 12/14/19 2138 18     Temp 12/14/19 2138 98.4 F (36.9 C)     Temp Source 12/14/19 2138 Oral     SpO2 12/14/19 2138 98 %     Weight 12/14/19 2148 154 lb 15.7 oz (70.3 kg)     Height 12/14/19 2148 5\' 6"  (1.676 m)     Head Circumference --      Peak Flow --      Pain Score 12/14/19 2148 0     Pain Loc --      Pain Edu? --      Excl. in [place]? --     Constitutional: Alert but disoriented, anxious, mild distress Eyes: Conjunctivae are injected ENT      Head: Normocephalic and atraumatic.      Nose: No congestion/rhinnorhea.      Mouth/Throat: Mucous membranes are moist.      Neck: No stridor. Cardiovascular: Normal rate, regular rhythm. No murmurs, rubs, or gallops. Respiratory: Normal respiratory effort without tachypnea nor retractions. Breath sounds are clear and equal bilaterally. No wheezes/rales/rhonchi. Gastrointestinal: Soft and nontender. Normal bowel sounds Musculoskeletal: Nontender with normal range of motion in extremities. No lower extremity tenderness nor edema. Neurologic:  No gross focal neurologic deficits are appreciated.  Spasticity is noted Skin:  Skin is warm, dry and intact. No rash noted. Psychiatric: Depressed and tearful, somewhat anxious appearing ____________________________________________  ED COURSE:  As part of my medical decision making, I reviewed the following data within the [place] History obtained from family if available, nursing notes, old chart and ekg, as well as notes from prior ED visits. Patient presented for altered mental status, we will assess with labs and imaging as indicated at this time.   Procedures  Sarah Donaldson was evaluated in Emergency Department on 12/14/2019 for the symptoms described in the history of present illness. She was evaluated in the context of the global COVID-19 pandemic, which necessitated consideration that the patient might be at risk for infection with the SARS-CoV-2 virus that causes  COVID-19. Institutional protocols and algorithms that pertain to the evaluation of patients at risk for COVID-19 are in a state of rapid change based on information released by regulatory bodies including the CDC and federal and state organizations. These policies and algorithms were followed during the patient's care in the ED.  ____________________________________________   LABS (pertinent positives/negatives)  Labs Reviewed  COMPREHENSIVE METABOLIC PANEL - Abnormal; Notable for the following components:      Result Value   Potassium 3.3 (*)    Glucose, Bld 113 (*)    BUN 27 (*)    Calcium 8.7 (*)    All other components within normal limits  CBC WITH DIFFERENTIAL/PLATELET  ETHANOL  URINALYSIS, COMPLETE (UACMP) WITH MICROSCOPIC  URINE DRUG SCREEN, QUALITATIVE ([facility] ONLY)  TROPONIN I (HIGH SENSITIVITY)    RADIOLOGY Images were viewed by me  CT head IMPRESSION: Thin crescentic subdural hematoma over the right cerebral convexity and posterior falx is much more convincingly demonstrated on recent MRI. This is stable to decreased in size from comparison. No new areas of hemorrhage. No associated  mass effect. ____________________________________________   DIFFERENTIAL DIAGNOSIS   Intoxication, subdural, overdose, dehydration, electrolyte abnormality, sepsis  FINAL ASSESSMENT AND PLAN  Altered mental status, subacute subdural hematoma   Plan: The patient had presented for altered mental status. Patient's labs have not revealed any acute process. Patient's imaging thus far has been reassuring.  She is pending psychiatric evaluation.   Laurence Aly, MD    Note: This note was generated in part or whole with voice recognition software. Voice recognition is usually quite accurate but there are transcription errors that can and very often do occur. I apologize for any typographical errors that were not detected and corrected.     Earleen [place], MD 12/14/19  [id number]

## 2019-12-14 NOTE — ED Notes (Signed)
RN collected blood via a straight stick to the left AC. Bleeding controlled post stick. Pt was unable to collect urine.

## 2019-12-15 DIAGNOSIS — N3000 Acute cystitis without hematuria: Secondary | ICD-10-CM | POA: Diagnosis not present

## 2019-12-15 LAB — URINALYSIS, COMPLETE (UACMP) WITH MICROSCOPIC
Bacteria, UA: NONE SEEN
Glucose, UA: NEGATIVE mg/dL
Hgb urine dipstick: NEGATIVE
Ketones, ur: 20 mg/dL — AB
Nitrite: NEGATIVE
Protein, ur: 30 mg/dL — AB
Specific Gravity, Urine: 1.038 — ABNORMAL HIGH (ref 1.005–1.030)
WBC, UA: >50 WBC/hpf — ABNORMAL HIGH (ref 0–5)
pH: 5 (ref 5.0–8.0)

## 2019-12-15 LAB — URINE DRUG SCREEN, QUALITATIVE (ARMC ONLY)
Amphetamines, Ur Screen: NOT DETECTED
Barbiturates, Ur Screen: NOT DETECTED
Benzodiazepine, Ur Scrn: POSITIVE — AB
Cannabinoid 50 Ng, Ur ~~LOC~~: NOT DETECTED
Cocaine Metabolite,Ur ~~LOC~~: NOT DETECTED
MDMA (Ecstasy)Ur Screen: NOT DETECTED
Methadone Scn, Ur: NOT DETECTED
Opiate, Ur Screen: NOT DETECTED
Phencyclidine (PCP) Ur S: NOT DETECTED
Tricyclic, Ur Screen: NOT DETECTED

## 2019-12-15 MED ORDER — CEFUROXIME AXETIL 250 MG PO TABS: 250.0000 mg | ORAL_TABLET | Freq: Two times a day (BID) | ORAL | 0 refills | Status: AC

## 2019-12-15 NOTE — ED Notes (Signed)
Pt continues resting in bed. NAD noted.

## 2019-12-15 NOTE — ED Provider Notes (Signed)
Emergency Medicine Observation Re-evaluation Note  Sarah Donaldson is a 69 y.o. adult, seen on rounds today.  Pt initially presented to the ED for complaints of Altered Mental Status Currently, the patient is no acute distress  The patient has been placed in psychiatric observation due to the need to provide a safe environment for the patient while obtaining psychiatric consultation and evaluation, as well as ongoing medical and medication management to treat the patient's condition.  The patient has not been placed under full IVC at this time.   Physical Exam  BP 113/71 (BP Location: Left Arm)   Pulse 81   Temp 98.4 F (36.9 C) (Oral)   Resp 18   Ht 5\' 6"  (1.676 m)   Wt 70.3 kg   SpO2 98%   BMI 25.01 kg/m  Physical Exam  ED Course / MDM  EKG:    I have reviewed the labs performed to date as well as medications administered while in observation.  Plan  Current plan is for psych eval Patient is not under full IVC at this time.   Vanessa Clarence, MD 12/15/19 [phone]

## 2019-12-15 NOTE — ED Provider Notes (Signed)
Patient evaluated by the psychiatric team who cleared patient for discharge home at 3 AM.  Given the timing will wait for discharge until the morning.  I reevaluate patient at 7 AM.  Patient alert and oriented x3.  Patient states that she feels comfortable going home denies any SI or any other pain at this time.  Patient states that her husband can pick her up and she feels safe and comfortable with that plan.  Labs are concerning for possible UTI so we will send culture and start some antibiotics.  Patient expressed understanding and can follow-up with her PCP  I discussed the provisional nature of ED diagnosis, the treatment so far, the ongoing plan of care, follow up appointments and return precautions with the patient and any family or support people present. They expressed understanding and agreed with the plan, discharged home.       Vanessa [place], MD 12/15/19 [phone]

## 2019-12-15 NOTE — ED Notes (Signed)
Psychiatry at bedside.

## 2019-12-15 NOTE — ED Notes (Signed)
Pt continues resting in the bed. NAD noted at this time.c Call bell remains in reach.

## 2019-12-15 NOTE — Discharge Instructions (Signed)
Patient CT imaging was stable.  Her urine was concerning for UTI so we started her on antibiotic.  Patient was seen by the psychiatric team and felt safe for discharge home.  She not meet IVC criteria.  She should follow-up with her PCP in 2 days for the urine culture results

## 2019-12-15 NOTE — ED Notes (Signed)
Pt urinated using bedpan. Pt requested this RN to call husband to pick pt up.

## 2019-12-15 NOTE — ED Notes (Signed)
This RN asked pt if they had someone to call for a ride home, pt stated to call husband.

## 2019-12-15 NOTE — Consult Note (Signed)
[facility] Consult   Reason for Consult: Altered Mental Status Referring Physician: Dr. Jimmye Donaldson Patient Identification: Sarah Donaldson MRN:  [id number] Principal Diagnosis: <principal problem not specified> Diagnosis:  Active Problems:   Anxiety disorder   Back ache   Colon polyp   Clinical depression   Difficulty hearing   Adiposity   Detached retina   Disordered sleep   Benign neoplasm of hand   Sleep difficulties   Chronic pain syndrome   Chronic pain of right lower extremity   Chronic myofascial pain   Sleep disorder   Backache   Bilateral low back pain with sciatica   Depressive disorder   Tremor   Chronic upper back pain   Chronic pain of left knee   Falls frequently   Total Time spent with patient: 30 minutes  Subjective: "I am mad at my son and my husband and I do not want you to talk to them." Sarah Donaldson is a 69 y.o. adult patient presented to [facility] ED via EMS voluntarily. Per the ED triage nursing note, the patient is here due to altered mental status (AMS).  The patient was recently admitted for multiple falls due to Parkinson's and diagnosis with subdural while here from her numerous falls.  It was reported that the patient presents heavily medicated and appears as she is intoxicated overmedicated.  The patient was discharged two days ago.  Post-discharge, it was reported that she was yelling at family requested that she come in as a behavioral health patient. The patient says she was screaming at her family, so their demand that she come to the hospital to be psychiatrically cleared before she could go home was absurd. "I am furious at them. I was here this morning and was discharged, and I am back again." The patient was seen face-to-face by this provider; chart reviewed and consulted with Dr. Jimmye Donaldson on 12/15/2019 due to the patient's care. It was discussed with the EDP that the patient does not meet the criteria to be admitted to the  psychiatric inpatient unit.  On evaluation, the patient is alert and oriented x 4, irritated, cooperative, and mood-congruent with affect.  The patient does not appear to be responding to internal or external stimuli. Neither is the patient presenting with any delusional thinking. The patient denies auditory or visual hallucinations. The patient denies any suicidal, homicidal, or self-harm ideations. The patient is not presenting with any psychotic or paranoid behaviors. During an encounter with the patient, he/she was able to answer questions appropriately. Collateral was not obtained because the patient refused to have this writer contact her son and her husband.  The patient voice her husband is cheating on her, and her son was "mean" to her. Plan: The patient is not a safety risk to self or others and does not require psychiatric inpatient admission for stabilization and treatment. HPI: Per Dr. Jimmye Donaldson: Sarah Donaldson is a 69 y.o. adult with a history of non-Hodgkin's lymphoma, GERD, hyperlipidemia, Parkinson's disease who presents to the ED for altered mental status.  Patient arrives from home by EMS for altered mental status.  Patient is crying and upset about her husband cheating on her.  She states she is fine, not sick. According to the patient she was screaming at her family and so they demanded that she come to the hospital to be psychiatrically cleared before she could go home.  Past Psychiatric History:  Parkinson disease ([place])  Risk to Self:  No Risk to Others:  No Prior Inpatient Therapy:  No Prior Outpatient Therapy:  No  Past Medical History:  Past Medical History:  Diagnosis Date  . Cancer ([place])    Non-hodgkin's lymphoma (in remission)  . GERD (gastroesophageal reflux disease)    well-controlled w/ omeprazole  . Hyperlipidemia   . Knee joint cyst, left 02/07/2018  . Parkinson disease ([place])   . Parkinson disease [facility])     Past Surgical History:  Procedure Laterality  Date  . BACK SURGERY    . BUNIONECTOMY    . NASAL SEPTUM SURGERY    . RETINAL DETACHMENT SURGERY    . TOTAL KNEE ARTHROPLASTY Left 06/07/2019   Procedure: TOTAL KNEE ARTHROPLASTY;  Surgeon: Lovell Sheehan, MD;  Location: [facility] ORS;  Service: Orthopedics;  Laterality: Left;   Family History:  Family History  Problem Relation Age of Onset  . Heart disease Mother   . Alcohol abuse Father   . Breast cancer Neg Hx   . Mental illness Neg Hx    Family Psychiatric  History:  Social History:  Social History   Substance and Sexual Activity  Alcohol Use Yes  . Alcohol/week: 0.0 standard drinks     Social History   Substance and Sexual Activity  Drug Use No    Social History   Socioeconomic History  . Marital status: Married    Spouse name: Sarah Donaldson  . Number of children: 4  . Years of education: Not on file  . Highest education level: Not on file  Occupational History    Comment: retired  Tobacco Use  . Smoking status: Former Research scientist (life sciences)  . Smokeless tobacco: Never Used  Substance and Sexual Activity  . Alcohol use: Yes    Alcohol/week: 0.0 standard drinks  . Drug use: No  . Sexual activity: Not Currently  Other Topics Concern  . Not on file  Social History Narrative  . Not on file   Social Determinants of Health   Financial Resource Strain: Low Risk   . Difficulty of Paying Living Expenses: Not hard at all  Food Insecurity: No Food Insecurity  . Worried About Charity fundraiser in the Last Year: Never true  . Ran Out of Food in the Last Year: Never true  Transportation Needs: No Transportation Needs  . Lack of Transportation (Medical): No  . Lack of Transportation (Non-Medical): No  Physical Activity: Inactive  . Days of Exercise per Week: 0 days  . Minutes of Exercise per Session: 0 min  Stress: Stress Concern Present  . Feeling of Stress : To some extent  Social Connections: Unknown  . Frequency of Communication with Friends and Family: Not on file  .  Frequency of Social Gatherings with Friends and Family: Not on file  . Attends Religious Services: More than 4 times per year  . Active Member of Clubs or Organizations: No  . Attends Archivist Meetings: Never  . Marital Status: Married   Additional Social History:    Allergies:   Allergies  Allergen Reactions  . Baclofen     Caused numbness in mouth   . Codeine Sulfate Nausea And Vomiting  . Gabapentin Swelling    Mouth swelling   . Terazosin     Labs:  Results for orders placed or performed during the hospital encounter of 12/14/19 (from the past 48 hour(s))  CBC with Differential     Status: None   Collection Time: 12/14/19  9:48 PM  Result Value Ref Range   WBC  8.0 4.0 - 10.5 K/uL   RBC 4.59 3.87 - 5.11 MIL/uL   Hemoglobin 13.1 12.0 - 15.0 g/dL   HCT 41.6 36.0 - 46.0 %   MCV 90.6 80.0 - 100.0 fL   MCH 28.5 26.0 - 34.0 pg   MCHC 31.5 30.0 - 36.0 g/dL   RDW 15.5 11.5 - 15.5 %   Platelets 210 150 - 400 K/uL   nRBC 0.0 0.0 - 0.2 %   Neutrophils Relative % 69 %   Neutro Abs 5.5 1.7 - 7.7 K/uL   Lymphocytes Relative 19 %   Lymphs Abs 1.5 0.7 - 4.0 K/uL   Monocytes Relative 10 %   Monocytes Absolute 0.8 0.1 - 1.0 K/uL   Eosinophils Relative 2 %   Eosinophils Absolute 0.2 0.0 - 0.5 K/uL   Basophils Relative 0 %   Basophils Absolute 0.0 0.0 - 0.1 K/uL   Immature Granulocytes 0 %   Abs Immature Granulocytes 0.03 0.00 - 0.07 K/uL    Comment: Performed at [facility], [address] [place], [place] [address]  Comprehensive metabolic panel     Status: Abnormal   Collection Time: 12/14/19  9:48 PM  Result Value Ref Range   Sodium 142 135 - 145 mmol/L   Potassium 3.3 (L) 3.5 - 5.1 mmol/L   Chloride 108 98 - 111 mmol/L   CO2 25 22 - 32 mmol/L   Glucose, Bld 113 (H) 70 - 99 mg/dL    Comment: Glucose reference range applies only to samples taken after fasting for at least 8 hours.   BUN 27 (H) 8 - 23 mg/dL   Creatinine, Ser 0.65 0.44 - 1.00 mg/dL    Calcium 8.7 (L) 8.9 - 10.3 mg/dL   Total Protein 6.6 6.5 - 8.1 g/dL   Albumin 4.2 3.5 - 5.0 g/dL   AST 25 15 - 41 U/L   ALT 6 0 - 44 U/L   Alkaline Phosphatase 103 38 - 126 U/L   Total Bilirubin 0.9 0.3 - 1.2 mg/dL   GFR calc non Af Amer >60 >60 mL/min   GFR calc Af Amer >60 >60 mL/min   Anion gap 9 5 - 15    Comment: Performed at [facility], [place], [place] [address]  Troponin I (High Sensitivity)     Status: None   Collection Time: 12/14/19  9:48 PM  Result Value Ref Range   Troponin I (High Sensitivity) 5 <18 ng/L    Comment: (NOTE) Elevated high sensitivity troponin I (hsTnI) values and significant  changes across serial measurements may suggest ACS but many other  chronic and acute conditions are known to elevate hsTnI results.  Refer to the "Links" section for chest pain algorithms and additional  guidance. Performed at [facility], [address] [place], [place] [address]   Ethanol     Status: None   Collection Time: 12/14/19  9:48 PM  Result Value Ref Range   Alcohol, Ethyl (B) <10 <10 mg/dL    Comment: (NOTE) Lowest detectable limit for serum alcohol is 10 mg/dL. For medical purposes only. Performed at [facility], [address] [place], [place] [address]   Urinalysis, Complete w Microscopic     Status: Abnormal   Collection Time: 12/15/19 12:16 AM  Result Value Ref Range   Color, Urine AMBER (A) YELLOW    Comment: BIOCHEMICALS MAY BE AFFECTED BY COLOR   APPearance HAZY (A) CLEAR   Specific Gravity, Urine 1.038 (  H) 1.005 - 1.030   pH 5.0 5.0 - 8.0   Glucose, UA NEGATIVE NEGATIVE mg/dL   Hgb urine dipstick NEGATIVE NEGATIVE   Bilirubin Urine SMALL (A) NEGATIVE   Ketones, ur 20 (A) NEGATIVE mg/dL   Protein, ur 30 (A) NEGATIVE mg/dL   Nitrite NEGATIVE NEGATIVE   Leukocytes,Ua LARGE (A) NEGATIVE   RBC / HPF 6-10 0 - 5 RBC/hpf   WBC, UA >50 (H) 0 - 5 WBC/hpf   Bacteria, UA NONE SEEN NONE SEEN   Squamous  Epithelial / LPF 0-5 0 - 5   Mucus PRESENT    Hyaline Casts, UA PRESENT    Ca Oxalate Crys, UA PRESENT     Comment: Performed at [facility], [address] [place], [place] [address]  Urine Drug Screen, Qualitative ([facility] only)     Status: Abnormal   Collection Time: 12/15/19 12:16 AM  Result Value Ref Range   Tricyclic, Ur Screen NONE DETECTED NONE DETECTED   Amphetamines, Ur Screen NONE DETECTED NONE DETECTED   MDMA (Ecstasy)Ur Screen NONE DETECTED NONE DETECTED   Cocaine Metabolite,Ur [place] NONE DETECTED NONE DETECTED   Opiate, Ur Screen NONE DETECTED NONE DETECTED   Phencyclidine (PCP) Ur S NONE DETECTED NONE DETECTED   Cannabinoid 50 Ng, Ur [place] NONE DETECTED NONE DETECTED   Barbiturates, Ur Screen NONE DETECTED NONE DETECTED   Benzodiazepine, Ur Scrn POSITIVE (A) NONE DETECTED   Methadone Scn, Ur NONE DETECTED NONE DETECTED    Comment: (NOTE) Tricyclics + metabolites, urine    Cutoff 1000 ng/mL Amphetamines + metabolites, urine  Cutoff 1000 ng/mL MDMA (Ecstasy), urine              Cutoff 500 ng/mL Cocaine Metabolite, urine          Cutoff 300 ng/mL Opiate + metabolites, urine        Cutoff 300 ng/mL Phencyclidine (PCP), urine         Cutoff 25 ng/mL Cannabinoid, urine                 Cutoff 50 ng/mL Barbiturates + metabolites, urine  Cutoff 200 ng/mL Benzodiazepine, urine              Cutoff 200 ng/mL Methadone, urine                   Cutoff 300 ng/mL The urine drug screen provides only a preliminary, unconfirmed analytical test result and should not be used for non-medical purposes. Clinical consideration and professional judgment should be applied to any positive drug screen result due to possible interfering substances. A more specific alternate chemical method must be used in order to obtain a confirmed analytical result. Gas chromatography / mass spectrometry (GC/MS) is the preferred confirmat ory method. Performed at [facility], [address], [place], [place] [address]     Current Facility-Administered Medications  Medication Dose Route Frequency Provider Last Rate Last Admin  . ARIPiprazole (ABILIFY) tablet 5 mg  5 mg Oral Daily Vanessa Powell, MD      . carbidopa-levodopa (SINEMET IR) 25-100 MG per tablet immediate release 2 tablet  2 tablet Oral 6 X Daily Vanessa Hartland, MD   2 tablet at 12/15/19 0025  . donepezil (ARICEPT) tablet 5 mg  5 mg Oral QHS Vanessa [place], MD   5 mg at 12/15/19 0024  . entacapone (COMTAN) tablet 200 mg  200 mg Oral 6 X Daily Vanessa [place], MD   200  mg at 12/15/19 0025  . escitalopram (LEXAPRO) tablet 10 mg  10 mg Oral Daily Vanessa Marble, MD      . melatonin tablet 5 mg  5 mg Oral QHS Vanessa [place], MD   5 mg at 12/15/19 0024  . neomycin-polymyxin b-dexamethasone (MAXITROL) ophthalmic ointment 1 application  1 application Left Eye QHS Vanessa [place], MD      . pantoprazole (PROTONIX) EC tablet 40 mg  40 mg Oral Daily Vanessa Casa, MD      . prednisoLONE acetate (PRED FORTE) 1 % ophthalmic suspension 1 drop  1 drop Left Eye Daily Vanessa Frontier, MD      . pregabalin (LYRICA) capsule 100 mg  100 mg Oral TID Vanessa [place], MD   100 mg at 12/15/19 0024  . QUEtiapine (SEROQUEL) tablet 50 mg  50 mg Oral QHS Vanessa [place], MD   50 mg at 12/15/19 0024  . tiZANidine (ZANAFLEX) tablet 2 mg  2 mg Oral TID PRN Vanessa [place], MD      . traZODone (DESYREL) tablet 50 mg  50 mg Oral QHS Vanessa [place], MD   50 mg at 12/15/19 [id number]   Current Outpatient Medications  Medication Sig Dispense Refill  . acetaminophen (TYLENOL) 650 MG CR tablet Take 1,300 mg by mouth every 8 (eight) hours as needed for pain.     . ARIPiprazole (ABILIFY) 5 MG tablet Take 5 mg by mouth daily.    . carbidopa-levodopa (SINEMET IR) 25-100 MG tablet Take 2 tablets by mouth 6 (six) times daily. Take 2 tablets q2 hours between 0800 and 2000.  Pt can take an extra 1-2 tabs at night as needed for tightness    . donepezil (ARICEPT) 5 MG tablet Take 1  tablet (5 mg total) by mouth at bedtime. 30 tablet 0  . entacapone (COMTAN) 200 MG tablet Take 200 mg by mouth 6 (six) times daily. With carbidopa    . escitalopram (LEXAPRO) 10 MG tablet Take 10 mg by mouth daily.    . melatonin 5 MG TABS Take 1 tablet (5 mg total) by mouth at bedtime.  0  . neomycin-polymyxin b-dexamethasone (MAXITROL) 3.5-10000-0.1 OINT Place 1 application into the left eye at bedtime.     Marland Kitchen omeprazole (PRILOSEC) 20 MG capsule Take 1 capsule (20 mg total) by mouth daily. 90 capsule 0  . prednisoLONE acetate (PRED FORTE) 1 % ophthalmic suspension Place 1 drop into the left eye daily.  5  . pregabalin (LYRICA) 50 MG capsule BID to TID prn (Patient taking differently: 100 mg 3 (three) times daily. ) 90 capsule 2  . QUEtiapine (SEROQUEL) 50 MG tablet Take 1 tablet (50 mg total) by mouth at bedtime. 30 tablet 1  . tiZANidine (ZANAFLEX) 2 MG tablet Take 1 tablet (2 mg total) by mouth 3 (three) times daily as needed (muscle spasm). 30 tablet 0  . traZODone (DESYREL) 50 MG tablet Take 50 mg by mouth at bedtime.     Marland Kitchen HYDROcodone-acetaminophen (NORCO/VICODIN) 5-325 MG tablet Take 1-2 tablets by mouth every 4 (four) hours as needed (pain score 4-6). (Patient not taking: Reported on 12/11/2019) 60 tablet 0  . metoCLOPramide (REGLAN) 5 MG tablet Take 1 tablet (5 mg total) by mouth every 8 (eight) hours as needed for nausea (if ondansetron (ZOFRAN) ineffective.). (Patient not taking: Reported on 12/11/2019) 30 tablet 0  . traMADol (ULTRAM) 50 MG tablet Take 1 tablet (50 mg total) by mouth every 6 (six)  hours. (Patient not taking: Reported on 12/11/2019) 30 tablet 1    Musculoskeletal: Strength & Muscle Tone: decreased Gait & Station: unsteady Patient leans: N/A  Psychiatric Specialty Exam: Physical Exam  Nursing note and vitals reviewed. Constitutional: She is oriented to person, place, and time.  Musculoskeletal:        General: Tenderness present.     Cervical back: Normal range of  motion and neck supple.  Neurological: She is alert and oriented to person, place, and time.  Psychiatric: Her behavior is normal. Thought content normal.    Review of Systems  Psychiatric/Behavioral: The patient is nervous/anxious.   All other systems reviewed and are negative.   Blood pressure 113/71, pulse 65, temperature 98.4 F (36.9 C), temperature source Oral, resp. rate 18, height 5\' 6"  (1.676 m), weight 70.3 kg, SpO2 96 %.Body mass index is 25.01 kg/m.  General Appearance: Casual  Eye Contact:  Good  Speech:  Clear and Coherent  Volume:  Decreased  Mood:  Anxious, Depressed and Irritable  Affect:  Congruent, Depressed and Flat  Thought Process:  Coherent  Orientation:  Full (Time, Place, and Person)  Thought Content:  Logical  Suicidal Thoughts:  No  Homicidal Thoughts:  No  Memory:  Immediate;   Fair Recent;   Fair Remote;   Fair  Judgement:  Fair  Insight:  Fair  Psychomotor Activity:  Normal  Concentration:  Concentration: Fair and Attention Span: Fair  Recall:  [employer] of Knowledge:  Fair  Language:  Fair  Akathisia:  Negative  Handed:  Right  AIMS (if indicated):     Assets:  Communication Skills Desire for Improvement Physical Health Resilience Social Support  ADL's:  Intact  Cognition:  WNL  Sleep:    Well in my own bed.     Treatment Plan Summary: Plan Patient does not meet criteria for psychiatric inpatient admission.  Disposition: No evidence of imminent risk to self or others at present.   Patient does not meet criteria for psychiatric inpatient admission. Supportive therapy provided about ongoing stressors. Discussed crisis plan, support from social network, calling 911, coming to the Emergency Department, and calling Suicide Hotline.  Caroline Sauger, NP 12/15/2019 2:49 AM

## 2019-12-15 NOTE — ED Notes (Signed)
Pt laying in bed in NAD. Pt upset to be in the room alone and has been asking for husband to come to room. Pt updated multiple times that husband has gone home for the night and wanted to be updated from home.

## 2019-12-16 LAB — URINE CULTURE

## 2019-12-18 ENCOUNTER — Other Ambulatory Visit: Payer: Self-pay | Admitting: Nurse Practitioner

## 2019-12-18 DIAGNOSIS — F22 Delusional disorders: Secondary | ICD-10-CM | POA: Diagnosis not present

## 2019-12-18 DIAGNOSIS — T50905A Adverse effect of unspecified drugs, medicaments and biological substances, initial encounter: Secondary | ICD-10-CM | POA: Diagnosis not present

## 2019-12-18 DIAGNOSIS — S065X9A Traumatic subdural hemorrhage with loss of consciousness of unspecified duration, initial encounter: Secondary | ICD-10-CM

## 2019-12-18 DIAGNOSIS — R441 Visual hallucinations: Secondary | ICD-10-CM | POA: Diagnosis not present

## 2019-12-18 DIAGNOSIS — F060 Psychotic disorder with hallucinations due to known physiological condition: Secondary | ICD-10-CM | POA: Diagnosis not present

## 2019-12-18 DIAGNOSIS — S065XAA Traumatic subdural hemorrhage with loss of consciousness status unknown, initial encounter: Secondary | ICD-10-CM

## 2019-12-18 DIAGNOSIS — G20 Parkinson's disease: Secondary | ICD-10-CM | POA: Diagnosis not present

## 2019-12-19 DIAGNOSIS — Z96652 Presence of left artificial knee joint: Secondary | ICD-10-CM | POA: Diagnosis not present

## 2019-12-26 DIAGNOSIS — F060 Psychotic disorder with hallucinations due to known physiological condition: Secondary | ICD-10-CM | POA: Diagnosis not present

## 2019-12-26 DIAGNOSIS — F22 Delusional disorders: Secondary | ICD-10-CM | POA: Diagnosis not present

## 2019-12-26 DIAGNOSIS — G20 Parkinson's disease: Secondary | ICD-10-CM | POA: Diagnosis not present

## 2019-12-26 DIAGNOSIS — R441 Visual hallucinations: Secondary | ICD-10-CM | POA: Diagnosis not present

## 2019-12-27 ENCOUNTER — Inpatient Hospital Stay: Payer: Medicare Other | Admitting: Internal Medicine

## 2019-12-27 ENCOUNTER — Other Ambulatory Visit: Payer: Self-pay | Admitting: Internal Medicine

## 2019-12-27 ENCOUNTER — Emergency Department: Payer: Medicare Other

## 2019-12-27 ENCOUNTER — Emergency Department
Admission: EM | Admit: 2019-12-27 | Discharge: 2019-12-29 | Disposition: A | Discharge status: Home / Self Care | Payer: Medicare Other | Attending: Emergency Medicine | Admitting: Emergency Medicine

## 2019-12-27 ENCOUNTER — Other Ambulatory Visit: Payer: Self-pay

## 2019-12-27 DIAGNOSIS — Z8572 Personal history of non-Hodgkin lymphomas: Secondary | ICD-10-CM | POA: Diagnosis not present

## 2019-12-27 DIAGNOSIS — F02818 Parkinson's disease without dyskinesia, without mention of fluctuations: Secondary | ICD-10-CM

## 2019-12-27 DIAGNOSIS — N390 Urinary tract infection, site not specified: Secondary | ICD-10-CM

## 2019-12-27 DIAGNOSIS — R443 Hallucinations, unspecified: Secondary | ICD-10-CM

## 2019-12-27 DIAGNOSIS — R441 Visual hallucinations: Secondary | ICD-10-CM | POA: Diagnosis not present

## 2019-12-27 DIAGNOSIS — D2360 Other benign neoplasm of skin of unspecified upper limb, including shoulder: Secondary | ICD-10-CM | POA: Insufficient documentation

## 2019-12-27 DIAGNOSIS — Z79899 Other long term (current) drug therapy: Secondary | ICD-10-CM | POA: Diagnosis not present

## 2019-12-27 DIAGNOSIS — R000 Tachycardia, unspecified: Secondary | ICD-10-CM | POA: Diagnosis not present

## 2019-12-27 DIAGNOSIS — Z046 Encounter for general psychiatric examination, requested by authority: Secondary | ICD-10-CM | POA: Insufficient documentation

## 2019-12-27 DIAGNOSIS — F0281 Dementia in other diseases classified elsewhere with behavioral disturbance: Secondary | ICD-10-CM | POA: Insufficient documentation

## 2019-12-27 DIAGNOSIS — Z96652 Presence of left artificial knee joint: Secondary | ICD-10-CM | POA: Insufficient documentation

## 2019-12-27 DIAGNOSIS — R4182 Altered mental status, unspecified: Secondary | ICD-10-CM | POA: Diagnosis present

## 2019-12-27 DIAGNOSIS — Z87891 Personal history of nicotine dependence: Secondary | ICD-10-CM | POA: Insufficient documentation

## 2019-12-27 DIAGNOSIS — R449 Unspecified symptoms and signs involving general sensations and perceptions: Secondary | ICD-10-CM | POA: Diagnosis not present

## 2019-12-27 DIAGNOSIS — G20 Parkinson's disease: Secondary | ICD-10-CM | POA: Diagnosis not present

## 2019-12-27 LAB — COMPREHENSIVE METABOLIC PANEL
ALT: <5 U/L (ref 0–44)
AST: 17 U/L (ref 15–41)
Albumin: 4.6 g/dL (ref 3.5–5.0)
Alkaline Phosphatase: 110 U/L (ref 38–126)
Anion gap: 12 (ref 5–15)
BUN: 36 mg/dL — ABNORMAL HIGH (ref 8–23)
CO2: 23 mmol/L (ref 22–32)
Calcium: 9.3 mg/dL (ref 8.9–10.3)
Chloride: 109 mmol/L (ref 98–111)
Creatinine, Ser: 0.44 mg/dL (ref 0.44–1.00)
GFR calc Af Amer: >60 mL/min (ref >60)
GFR calc non Af Amer: >60 mL/min (ref >60)
Glucose, Bld: 118 mg/dL — ABNORMAL HIGH (ref 70–99)
Potassium: 3.9 mmol/L (ref 3.5–5.1)
Sodium: 144 mmol/L (ref 135–145)
Total Bilirubin: 0.9 mg/dL (ref 0.3–1.2)
Total Protein: 7.1 g/dL (ref 6.5–8.1)

## 2019-12-27 LAB — CBC WITH DIFFERENTIAL/PLATELET
Abs Immature Granulocytes: 0.04 10*3/uL (ref 0.00–0.07)
Basophils Absolute: 0 10*3/uL (ref 0.0–0.1)
Basophils Relative: 0 %
Eosinophils Absolute: 0 10*3/uL (ref 0.0–0.5)
Eosinophils Relative: 0 %
HCT: 43.7 % (ref 36.0–46.0)
Hemoglobin: 13.9 g/dL (ref 12.0–15.0)
Immature Granulocytes: 0 %
Lymphocytes Relative: 18 %
Lymphs Abs: 1.6 10*3/uL (ref 0.7–4.0)
MCH: 28.7 pg (ref 26.0–34.0)
MCHC: 31.8 g/dL (ref 30.0–36.0)
MCV: 90.3 fL (ref 80.0–100.0)
Monocytes Absolute: 0.9 10*3/uL (ref 0.1–1.0)
Monocytes Relative: 9 %
Neutro Abs: 6.6 10*3/uL (ref 1.7–7.7)
Neutrophils Relative %: 73 %
Platelets: 270 10*3/uL (ref 150–400)
RBC: 4.84 MIL/uL (ref 3.87–5.11)
RDW: 14.7 % (ref 11.5–15.5)
WBC: 9.2 10*3/uL (ref 4.0–10.5)
nRBC: 0 % (ref 0.0–0.2)

## 2019-12-27 LAB — URINALYSIS, COMPLETE (UACMP) WITH MICROSCOPIC
Bacteria, UA: NONE SEEN
Bilirubin Urine: NEGATIVE
Glucose, UA: NEGATIVE mg/dL
Hgb urine dipstick: NEGATIVE
Ketones, ur: 20 mg/dL — AB
Nitrite: NEGATIVE
Protein, ur: 30 mg/dL — AB
Specific Gravity, Urine: 1.034 — ABNORMAL HIGH (ref 1.005–1.030)
pH: 5 (ref 5.0–8.0)

## 2019-12-27 LAB — SALICYLATE LEVEL: Salicylate Lvl: <7 mg/dL — ABNORMAL LOW (ref 7.0–30.0)

## 2019-12-27 LAB — URINE DRUG SCREEN, QUALITATIVE (ARMC ONLY)
Amphetamines, Ur Screen: NOT DETECTED
Barbiturates, Ur Screen: NOT DETECTED
Benzodiazepine, Ur Scrn: NOT DETECTED
Cannabinoid 50 Ng, Ur ~~LOC~~: NOT DETECTED
Cocaine Metabolite,Ur ~~LOC~~: NOT DETECTED
MDMA (Ecstasy)Ur Screen: NOT DETECTED
Methadone Scn, Ur: NOT DETECTED
Opiate, Ur Screen: NOT DETECTED
Phencyclidine (PCP) Ur S: NOT DETECTED
Tricyclic, Ur Screen: NOT DETECTED

## 2019-12-27 LAB — TROPONIN I (HIGH SENSITIVITY)
Troponin I (High Sensitivity): 5 ng/L (ref <18)
Troponin I (High Sensitivity): 6 ng/L (ref <18)

## 2019-12-27 LAB — ACETAMINOPHEN LEVEL: Acetaminophen (Tylenol), Serum: <10 ug/mL — ABNORMAL LOW (ref 10–30)

## 2019-12-27 LAB — ETHANOL: Alcohol, Ethyl (B): <10 mg/dL (ref <10)

## 2019-12-27 MED ORDER — PREGABALIN 50 MG PO CAPS
100.0000 mg | ORAL_CAPSULE | Freq: Three times a day (TID) | ORAL | Status: DC
  Administered 2019-12-27: 100 mg via ORAL
  Filled 2019-12-27 (×2): qty 2

## 2019-12-27 MED ORDER — QUETIAPINE FUMARATE 25 MG PO TABS: 50.0000 mg | ORAL_TABLET | Freq: Every day | ORAL | Status: DC

## 2019-12-27 MED ORDER — CARBIDOPA-LEVODOPA 25-250 MG PO TABS
1.0000 | ORAL_TABLET | ORAL | Status: DC
  Administered 2019-12-27 (×4): 1 via ORAL
  Filled 2019-12-27 (×9): qty 1

## 2019-12-27 MED ORDER — OLANZAPINE 2.5 MG PO TABS: 1.2500 mg | ORAL_TABLET | Freq: Two times a day (BID) | ORAL | Status: DC

## 2019-12-27 MED ORDER — DICLOFENAC SODIUM 75 MG PO TBEC
75.0000 mg | DELAYED_RELEASE_TABLET | Freq: Two times a day (BID) | ORAL | Status: DC
  Administered 2019-12-27 – 2019-12-29 (×4): 75 mg via ORAL
  Filled 2019-12-27 (×7): qty 1

## 2019-12-27 MED ORDER — NEOMYCIN-POLYMYXIN-DEXAMETH 3.5-10000-0.1 OP OINT
1.0000 "application " | TOPICAL_OINTMENT | Freq: Every day | OPHTHALMIC | Status: DC
  Filled 2019-12-27: qty 3.5

## 2019-12-27 MED ORDER — PREGABALIN 50 MG PO CAPS
100.0000 mg | ORAL_CAPSULE | Freq: Three times a day (TID) | ORAL | Status: DC
  Administered 2019-12-27 – 2019-12-29 (×6): 100 mg via ORAL
  Filled 2019-12-27 (×8): qty 2

## 2019-12-27 MED ORDER — FUROSEMIDE 40 MG PO TABS
40.0000 mg | ORAL_TABLET | Freq: Every day | ORAL | Status: DC
  Administered 2019-12-28 – 2019-12-29 (×2): 40 mg via ORAL
  Filled 2019-12-27 (×3): qty 1

## 2019-12-27 MED ORDER — ESCITALOPRAM OXALATE 10 MG PO TABS
10.0000 mg | ORAL_TABLET | Freq: Every day | ORAL | Status: DC
  Administered 2019-12-28 – 2019-12-29 (×2): 10 mg via ORAL
  Filled 2019-12-27 (×4): qty 1

## 2019-12-27 MED ORDER — DONEPEZIL HCL 5 MG PO TABS
5.0000 mg | ORAL_TABLET | Freq: Every day | ORAL | Status: DC
  Administered 2019-12-27 – 2019-12-28 (×2): 5 mg via ORAL
  Filled 2019-12-27 (×2): qty 1

## 2019-12-27 MED ORDER — PANTOPRAZOLE SODIUM 40 MG PO TBEC
40.0000 mg | DELAYED_RELEASE_TABLET | Freq: Every day | ORAL | Status: DC
  Administered 2019-12-28 – 2019-12-29 (×2): 40 mg via ORAL
  Filled 2019-12-27 (×3): qty 1

## 2019-12-27 MED ORDER — SODIUM CHLORIDE 0.9 % IV SOLN
1.0000 g | Freq: Once | INTRAVENOUS | Status: AC
  Administered 2019-12-27: 1 g via INTRAVENOUS
  Filled 2019-12-27: qty 10

## 2019-12-27 MED ORDER — CARBIDOPA-LEVODOPA 25-250 MG PO TABS: 1.0000 | ORAL_TABLET | ORAL | Status: DC

## 2019-12-27 MED ORDER — MELATONIN 5 MG PO TABS
5.0000 mg | ORAL_TABLET | Freq: Every day | ORAL | Status: DC
  Administered 2019-12-27 – 2019-12-28 (×2): 5 mg via ORAL
  Filled 2019-12-27 (×3): qty 1

## 2019-12-27 MED ORDER — ENTACAPONE 200 MG PO TABS
200.0000 mg | ORAL_TABLET | Freq: Every day | ORAL | Status: DC
  Administered 2019-12-27 – 2019-12-29 (×12): 200 mg via ORAL
  Filled 2019-12-27 (×22): qty 1

## 2019-12-27 MED ORDER — LORAZEPAM 2 MG/ML IJ SOLN
2.0000 mg | Freq: Once | INTRAMUSCULAR | Status: AC
  Administered 2019-12-27: 2 mg via INTRAMUSCULAR
  Filled 2019-12-27: qty 1

## 2019-12-27 MED ORDER — CEPHALEXIN 250 MG PO CAPS
250.0000 mg | ORAL_CAPSULE | Freq: Three times a day (TID) | ORAL | Status: DC
  Administered 2019-12-27 – 2019-12-29 (×8): 250 mg via ORAL
  Filled 2019-12-27 (×7): qty 1

## 2019-12-27 MED ORDER — TIZANIDINE HCL 4 MG PO TABS
4.0000 mg | ORAL_TABLET | Freq: Three times a day (TID) | ORAL | Status: DC | PRN
  Administered 2019-12-29: 4 mg via ORAL
  Filled 2019-12-27 (×3): qty 1

## 2019-12-27 MED ORDER — OLANZAPINE 2.5 MG PO TABS
1.2500 mg | ORAL_TABLET | Freq: Every day | ORAL | Status: DC
  Administered 2019-12-28 – 2019-12-29 (×2): 1.25 mg via ORAL
  Filled 2019-12-27 (×2): qty 1

## 2019-12-27 MED ORDER — PREDNISOLONE ACETATE 1 % OP SUSP
1.0000 [drp] | Freq: Every day | OPHTHALMIC | Status: DC
  Administered 2019-12-27 – 2019-12-29 (×3): 1 [drp] via OPHTHALMIC
  Filled 2019-12-27 (×5): qty 5

## 2019-12-27 MED ORDER — CARBIDOPA-LEVODOPA 25-100 MG PO TABS
2.0000 | ORAL_TABLET | Freq: Every day | ORAL | Status: DC
  Filled 2019-12-27 (×3): qty 2

## 2019-12-27 MED ORDER — OLANZAPINE 5 MG PO TABS
2.5000 mg | ORAL_TABLET | Freq: Every day | ORAL | Status: DC
  Administered 2019-12-27 – 2019-12-28 (×2): 2.5 mg via ORAL
  Filled 2019-12-27 (×2): qty 1

## 2019-12-27 MED ORDER — HALOPERIDOL LACTATE 5 MG/ML IJ SOLN
10.0000 mg | Freq: Once | INTRAMUSCULAR | Status: AC
  Administered 2019-12-27: 10 mg via INTRAMUSCULAR
  Filled 2019-12-27: qty 2

## 2019-12-27 MED ORDER — CARBIDOPA-LEVODOPA 25-250 MG PO TABS
1.0000 | ORAL_TABLET | Freq: Once | ORAL | Status: AC
  Administered 2019-12-27: 1 via ORAL
  Filled 2019-12-27: qty 1

## 2019-12-27 MED ORDER — TRAZODONE HCL 50 MG PO TABS
50.0000 mg | ORAL_TABLET | Freq: Every day | ORAL | Status: DC
  Administered 2019-12-27 – 2019-12-28 (×2): 50 mg via ORAL
  Filled 2019-12-27 (×2): qty 1

## 2019-12-27 MED ORDER — NEOMYCIN-POLYMYXIN-DEXAMETH 3.5-10000-0.1 OP SUSP
1.0000 [drp] | Freq: Four times a day (QID) | OPHTHALMIC | Status: DC
  Administered 2019-12-27 – 2019-12-29 (×6): 1 [drp] via OPHTHALMIC
  Filled 2019-12-27 (×2): qty 5

## 2019-12-27 NOTE — ED Notes (Signed)
VOL, unable to assess, to be reassessed in the a.m.

## 2019-12-27 NOTE — BH Assessment (Signed)
TTS and Psyc NP attempted to assess patient, patient continues to have difficulty speaking, understanding questions and is not oriented. Will attempt assessment at a later time.

## 2019-12-27 NOTE — BH Assessment (Addendum)
Assessment Note Sarah Donaldson is an 69 y.o. adult who presented voluntarily to [facility] ED for treatment. Per triage note Pt brought in by EMS from home, husband called out for AMS. Husband states her mentation was normal earlier, states became altered and confused. Hx of the same and also aggressive behavior.   During TTS assessment pt was unable to be assessed due to her current presentation (lying in bed, eyes closed with slight fluttering and abrupt body shakes). Pt's husband Sarah Donaldson) agreed to provide assessment information. Pt husband confirmed the information provided to triage RN. Sarah Donaldson confirmed pt diagnosis of Parkinson's in late 92's and her symptoms worsening over the last two months. Sarah Donaldson reported pt being admitted last Monday to neurology, being discharged on Wednesday and returning on Thursday due to a decline in her mentation. Sarah Donaldson described pt's presentation recently changing from baseline (Parkinson) to a manic state (AH/VH, pasting the floor and gagging) after having dinner with family last night. Sarah Donaldson reports pt communicating some persecutory delusions (you plan to leave me to be with your grilfriend, have fun without me). Sarah Donaldson confirmed pt completing a visit to Sarah Donaldson yesterday and shared recent changes made to her medications with Sarah Donaldson. Sarah Donaldson reports a decrease in pt's sleeping habits the last few days and denied pt sleeping any last night. Sarah Donaldson describes pt's AH and VH to be (relatives that live in [place]). Sarah Donaldson denies pt endorsing SI/HI or SA. Sarah Donaldson reports to be unaware of any recent triggers and denies any previous or current psychiatric inpatient or outpatient treatment.   Per Sarah Donaldson pt is recommended for re-admission to neurology   Diagnosis: Parkinsons  Past Medical History:  Past Medical History:  Diagnosis Date  . Cancer ([place])    Non-hodgkin's lymphoma (in remission)  . GERD (gastroesophageal reflux disease)    well-controlled w/ omeprazole  . Hyperlipidemia   . Knee  joint cyst, left 02/07/2018  . Parkinson disease ([place])   . Parkinson disease [facility])     Past Surgical History:  Procedure Laterality Date  . BACK SURGERY    . BUNIONECTOMY    . NASAL SEPTUM SURGERY    . RETINAL DETACHMENT SURGERY    . TOTAL KNEE ARTHROPLASTY Left 06/07/2019   Procedure: TOTAL KNEE ARTHROPLASTY;  Surgeon: Lovell Sheehan, MD;  Location: [facility] ORS;  Service: Orthopedics;  Laterality: Left;    Family History:  Family History  Problem Relation Age of Onset  . Heart disease Mother   . Alcohol abuse Father   . Breast cancer Neg Hx   . Mental illness Neg Hx     Social History:  reports that she has quit smoking. She has never used smokeless tobacco. She reports current alcohol use. She reports that she does not use drugs.  Additional Social History:  Alcohol / Drug Use Pain Medications: see mar Prescriptions: see  mar Over the Counter: see mar History of alcohol / drug use?: No history of alcohol / drug abuse  CIWA: CIWA-Ar BP: 138/90 Pulse Rate: 94 COWS:    Allergies:  Allergies  Allergen Reactions  . Percocet [Oxycodone-Acetaminophen]     Hallucination  . Baclofen     Caused numbness in mouth   . Codeine Sulfate Nausea And Vomiting  . Gabapentin Swelling    Mouth swelling   . Terazosin   . Tramadol Hcl     Interferes with Serotonin levels which affects her Parkinsons    Home Medications: (Not in a hospital admission)  OB/GYN Status:  No LMP recorded. Patient is postmenopausal.  General Assessment Data Location of Assessment: [facility] ED TTS Assessment: In system Is this a Tele or Face-to-Face Assessment?: Face-to-Face Is this an Initial Assessment or a Re-assessment for this encounter?: Initial Assessment Patient Accompanied by:: Adult(husband ) Permission Given to speak with another: Yes Name, Relationship and Phone Number: Sarah Donaldson [phone] Language Other than English: No Living Arrangements: Other (Comment)(Private home) What gender  do you identify as?: Female Marital status: Married [place] name: unknown Pregnancy Status: No Living Arrangements: Spouse/significant other Can pt return to current living arrangement?: Yes Admission Status: Voluntary Is patient capable of signing voluntary admission?: No(Not currently ) Referral Source: Self/Family/Friend Insurance type: Medicare  Medical Screening Exam ([place]) Medical Exam completed: Yes  Crisis Care Plan Living Arrangements: Spouse/significant other Legal Guardian: (N/A) Name of Psychiatrist: Dr. Truman Donaldson Name of Therapist: (None reported )  Education Status Is patient currently in school?: No Is the patient employed, unemployed or receiving disability?: Receiving disability income  Risk to self with the past 6 months Suicidal Ideation: No(Husband reports none) Has patient been a risk to self within the past 6 months prior to admission? : Yes Suicidal Intent: No Has patient had any suicidal intent within the past 6 months prior to admission? : No(Husband reports none) Is patient at risk for suicide?: No, but patient needs Medical Clearance Suicidal Plan?: No(husband report none ) Has patient had any suicidal plan within the past 6 months prior to admission? : No Access to Means: No(husband report none) What has been your use of drugs/alcohol within the last 12 months?: Alcohol (1 glass of wine a few months ago) Previous Attempts/Gestures: No How many times?: 0 Other Self Harm Risks: None reported  Triggers for Past Attempts: Unknown Intentional Self Injurious Behavior: None Family Suicide History: No(husband report none ) Recent stressful life event(s): (None reported ) Persecutory voices/beliefs?: Yes Depression: Yes Depression Symptoms: Insomnia, Loss of interest in usual pleasures Substance abuse history and/or treatment for substance abuse?: No(husband report none ) Suicide prevention information given to non-admitted patients: Yes  Risk to  Others within the past 6 months Homicidal Ideation: No Does patient have any lifetime risk of violence toward others beyond the six months prior to admission? : No Thoughts of Harm to Others: No Current Homicidal Intent: No Current Homicidal Plan: No Access to Homicidal Means: No Identified Victim: N/A History of harm to others?: No Assessment of Violence: None Noted Violent Behavior Description: (None reported ) Does patient have access to weapons?: No Criminal Charges Pending?: No Does patient have a court date: No Is patient on probation?: No  Psychosis Hallucinations: Auditory, Visual Delusions: Persecutory  Mental Status Report Appearance/Hygiene: In scrubs Eye Contact: Unable to Assess Motor Activity: Unable to assess(pt was unable to speak due to current mental state ) Speech: Unable to assess Level of Consciousness: Other (Comment)(pt laying in bed with eyes closed, aburpt body shakes) Mood: Other (Comment)(UTA) Affect: Unable to Assess Anxiety Level: (UTA) Thought Processes: Unable to Assess Judgement: Unable to Assess Orientation: Unable to assess Obsessive Compulsive Thoughts/Behaviors: Unable to Assess  Cognitive Functioning Concentration: Unable to Assess Memory: Unable to Assess Is patient IDD: No Insight: Unable to Assess Impulse Control: Unable to Assess Appetite: Good(husband report no concerns) Have you had any weight changes? : No Change Sleep: Decreased Total Hours of Sleep: 4(husband report little to no sleep last few days) Vegetative Symptoms: None  ADLScreening [facility] Assessment Services) Patient's cognitive ability adequate to  safely complete daily activities?: No(husband reports decline last two months) Patient able to express need for assistance with ADLs?: No(husband report decline last 2 months) Independently performs ADLs?: No(husband reports decline last 2 months )  Prior Inpatient Therapy Prior Inpatient Therapy: No  Prior Outpatient  Therapy Prior Outpatient Therapy: No  ADL Screening (condition at time of admission) Patient's cognitive ability adequate to safely complete daily activities?: No(husband reports decline last two months) Is the patient deaf or have difficulty hearing?: No Does the patient have difficulty seeing, even when wearing glasses/contacts?: No Does the patient have difficulty concentrating, remembering, or making decisions?: Yes Patient able to express need for assistance with ADLs?: No(husband report decline last 2 months) Does the patient have difficulty dressing or bathing?: No Independently performs ADLs?: No(husband reports decline last 2 months ) Does the patient have difficulty walking or climbing stairs?: Yes Weakness of Legs: None Weakness of Arms/Hands: None  Home Assistive Devices/Equipment Home Assistive Devices/Equipment: None  Therapy Consults (therapy consults require a physician order) PT Evaluation Needed: No OT Evalulation Needed: No SLP Evaluation Needed: No Abuse/Neglect Assessment (Assessment to be complete while patient is alone) Abuse/Neglect Assessment Can Be Completed: Unable to assess, patient is non-responsive or altered mental status Values / Beliefs Cultural Requests During Hospitalization: None Spiritual Requests During Hospitalization: None Consults Spiritual Care Consult Needed: No Transition of Care Team Consult Needed: No Advance Directives (For Healthcare) Does Patient Have a Medical Advance Directive?: Yes Type of Advance Directive: Healthcare Power of Attorney          Disposition:  Disposition Initial Assessment Completed for this Encounter: Yes Patient referred to: Other (Comment)  On Site Evaluation by:   Reviewed with Physician:    Shanon Ace 12/27/2019 1:43 PM

## 2019-12-27 NOTE — ED Notes (Signed)
Husband leaving bedside at this time. Pt asleep in bed, waiting on neuro consult in AM

## 2019-12-27 NOTE — Consult Note (Signed)
[facility] Consult   Reason for Consult:  Psych symptoms in face of advanced Parkinson's disease, possible dementia issues  Referring Physician:   ED  Patient Identification: Sarah Donaldson MRN:  [id number]   Principal Diagnosis:  Psychosis due to parkinson's disease / possible dementia    Diagnosis:   Same  Total Time spent with patient:  40 plus 30 min   Subjective:   Sarah Donaldson is a 69 y.o. adult patient admitted with worsening cognitive and psychiatric symptoms at least for two months.  Recently inpatient now back in  Hospital after having one or two days of decreased sleep, strange and odd behaviors, olfactory hallucinations, multiple illogical statements and behaviors --turning lights on and off, wanting to wander away.  Making statements that do not make sense.  She is having visual and auditory hallucinations where she is seeing her dead relatives and hearing them speak to her.  She is paranoid and fearful at times and often agitated.  Currently she is sedated so most of history is from her Husband.  Marland Kitchen  HPI:  As above  She has had a downhill course ---over last several months.  Her Carbidopa recently was increased by Neurology however psychiatry symptoms were developing before this.  Patient has been having some memory loss and changes as well but it is too vague to discern at this time and patient is sedated      Past Psychiatric History:  None  No previous ETOH drugs or substance, no previous psych meds or admits   Only received Seroquel with unclear effects and it made her somewhat dizzy and clouded --did not help with the increasing psychosis.    Risk to Self:  She has not alluded to SI and plans  She has not harmed others and has not become violent and OOC to others    Risk to Others: Homicidal Ideation: No Thoughts of Harm to Others: No Current Homicidal Intent: No Current Homicidal Plan: No Access to Homicidal Means: No Identified Victim:  N/A History of harm to others?: No Assessment of Violence: None Noted Violent Behavior Description: (None reported ) Does patient have access to weapons?: No Criminal Charges Pending?: No Does patient have a court date: No Prior Inpatient Therapy: Prior Inpatient Therapy: No Prior Outpatient Therapy: Prior Outpatient Therapy: No  Past Medical History:  Past Medical History:  Diagnosis Date  . Cancer ([place])    Non-hodgkin's lymphoma (in remission)  . GERD (gastroesophageal reflux disease)    well-controlled w/ omeprazole  . Hyperlipidemia   . Knee joint cyst, left 02/07/2018  . Parkinson disease ([place])   . Parkinson disease [facility])     Past Surgical History:  Procedure Laterality Date  . BACK SURGERY    . BUNIONECTOMY    . NASAL SEPTUM SURGERY    . RETINAL DETACHMENT SURGERY    . TOTAL KNEE ARTHROPLASTY Left 06/07/2019   Procedure: TOTAL KNEE ARTHROPLASTY;  Surgeon: Lovell Sheehan, MD;  Location: [facility] ORS;  Service: Orthopedics;  Laterality: Left;   Family History:  Family History  Problem Relation Age of Onset  . Heart disease Mother   . Alcohol abuse Father   . Breast cancer Neg Hx   . Mental illness Neg Hx    Family Psychiatric  History: none  Social History:  Lives with husband, grown kids out of  House and are supportive --life time homemaker  Social History   Substance and Sexual Activity  Alcohol Use Yes  .  Alcohol/week: 0.0 standard drinks     Social History   Substance and Sexual Activity  Drug Use No    Social History   Socioeconomic History  . Marital status: Married    Spouse name: Viya Hackbarth  . Number of children: 4  . Years of education: Not on file  . Highest education level: Not on file  Occupational History    Comment: retired  Tobacco Use  . Smoking status: Former Research scientist (life sciences)  . Smokeless tobacco: Never Used  Substance and Sexual Activity  . Alcohol use: Yes    Alcohol/week: 0.0 standard drinks  . Drug use: No  . Sexual activity: Not  Currently  Other Topics Concern  . Not on file  Social History Narrative  . Not on file   Social Determinants of Health   Financial Resource Strain: Low Risk   . Difficulty of Paying Living Expenses: Not hard at all  Food Insecurity: No Food Insecurity  . Worried About Charity fundraiser in the Last Year: Never true  . Ran Out of Food in the Last Year: Never true  Transportation Needs: No Transportation Needs  . Lack of Transportation (Medical): No  . Lack of Transportation (Non-Medical): No  Physical Activity: Inactive  . Days of Exercise per Week: 0 days  . Minutes of Exercise per Session: 0 min  Stress: Stress Concern Present  . Feeling of Stress : To some extent  Social Connections: Unknown  . Frequency of Communication with Friends and Family: Not on file  . Frequency of Social Gatherings with Friends and Family: Not on file  . Attends Religious Services: More than 4 times per year  . Active Member of Clubs or Organizations: No  . Attends Archivist Meetings: Never  . Marital Status: Married   Additional Social History:    Allergies:   Allergies  Allergen Reactions  . Percocet [Oxycodone-Acetaminophen]     Hallucination  . Baclofen     Caused numbness in mouth   . Codeine Sulfate Nausea And Vomiting  . Gabapentin Swelling    Mouth swelling   . Terazosin   . Tramadol Hcl     Interferes with Serotonin levels which affects her Parkinsons    Labs:  Results for orders placed or performed during the hospital encounter of 12/27/19 (from the past 48 hour(s))  CBC with Differential     Status: None   Collection Time: 12/27/19  1:44 AM  Result Value Ref Range   WBC 9.2 4.0 - 10.5 K/uL   RBC 4.84 3.87 - 5.11 MIL/uL   Hemoglobin 13.9 12.0 - 15.0 g/dL   HCT 43.7 36.0 - 46.0 %   MCV 90.3 80.0 - 100.0 fL   MCH 28.7 26.0 - 34.0 pg   MCHC 31.8 30.0 - 36.0 g/dL   RDW 14.7 11.5 - 15.5 %   Platelets 270 150 - 400 K/uL   nRBC 0.0 0.0 - 0.2 %   Neutrophils  Relative % 73 %   Neutro Abs 6.6 1.7 - 7.7 K/uL   Lymphocytes Relative 18 %   Lymphs Abs 1.6 0.7 - 4.0 K/uL   Monocytes Relative 9 %   Monocytes Absolute 0.9 0.1 - 1.0 K/uL   Eosinophils Relative 0 %   Eosinophils Absolute 0.0 0.0 - 0.5 K/uL   Basophils Relative 0 %   Basophils Absolute 0.0 0.0 - 0.1 K/uL   Immature Granulocytes 0 %   Abs Immature Granulocytes 0.04 0.00 - 0.07 K/uL  Comment: Performed at [facility], [address] [place], [place] [address]  Comprehensive metabolic panel     Status: Abnormal   Collection Time: 12/27/19  1:44 AM  Result Value Ref Range   Sodium 144 135 - 145 mmol/L   Potassium 3.9 3.5 - 5.1 mmol/L   Chloride 109 98 - 111 mmol/L   CO2 23 22 - 32 mmol/L   Glucose, Bld 118 (H) 70 - 99 mg/dL    Comment: Glucose reference range applies only to samples taken after fasting for at least 8 hours.   BUN 36 (H) 8 - 23 mg/dL   Creatinine, Ser 0.44 0.44 - 1.00 mg/dL   Calcium 9.3 8.9 - 10.3 mg/dL   Total Protein 7.1 6.5 - 8.1 g/dL   Albumin 4.6 3.5 - 5.0 g/dL   AST 17 15 - 41 U/L   ALT <5 0 - 44 U/L   Alkaline Phosphatase 110 38 - 126 U/L   Total Bilirubin 0.9 0.3 - 1.2 mg/dL   GFR calc non Af Amer >60 >60 mL/min   GFR calc Af Amer >60 >60 mL/min   Anion gap 12 5 - 15    Comment: Performed at [facility], [address] [place], [place] [address]  Ethanol     Status: None   Collection Time: 12/27/19  1:44 AM  Result Value Ref Range   Alcohol, Ethyl (B) <10 <10 mg/dL    Comment: (NOTE) Lowest detectable limit for serum alcohol is 10 mg/dL. For medical purposes only. Performed at [facility], [place], [place] [address]   Acetaminophen level     Status: Abnormal   Collection Time: 12/27/19  1:44 AM  Result Value Ref Range   Acetaminophen (Tylenol), Serum <10 (L) 10 - 30 ug/mL    Comment: (NOTE) Therapeutic concentrations vary significantly. A range of 10-30 ug/mL  may be an effective  concentration for many patients. However, some  are best treated at concentrations outside of this range. Acetaminophen concentrations >150 ug/mL at 4 hours after ingestion  and >50 ug/mL at 12 hours after ingestion are often associated with  toxic reactions. Performed at [facility], [address] [place], [place] [id number]   Salicylate level     Status: Abnormal   Collection Time: 12/27/19  1:44 AM  Result Value Ref Range   Salicylate Lvl Q000111Q (L) 7.0 - 30.0 mg/dL    Comment: Performed at [facility], [place], [place] [address]  Troponin I (High Sensitivity)     Status: None   Collection Time: 12/27/19  1:44 AM  Result Value Ref Range   Troponin I (High Sensitivity) 5 <18 ng/L    Comment: (NOTE) Elevated high sensitivity troponin I (hsTnI) values and significant  changes across serial measurements may suggest ACS but many other  chronic and acute conditions are known to elevate hsTnI results.  Refer to the "Links" section for chest pain algorithms and additional  guidance. Performed at [facility], [place],  [address]   Troponin I (High Sensitivity)     Status: None   Collection Time: 12/27/19  3:35 AM  Result Value Ref Range   Troponin I (High Sensitivity) 6 <18 ng/L    Comment: (NOTE) Elevated high sensitivity troponin I (hsTnI) values and significant  changes across serial measurements may suggest ACS but many other  chronic and acute conditions are known to elevate hsTnI results.  Refer to  the "Links" section for chest pain algorithms and additional  guidance. Performed at [facility], [address] [place], [place] [address]   Urinalysis, Complete w Microscopic     Status: Abnormal   Collection Time: 12/27/19  5:23 AM  Result Value Ref Range   Color, Urine AMBER (A) YELLOW    Comment: BIOCHEMICALS MAY BE AFFECTED BY COLOR   APPearance HAZY (A) CLEAR   Specific Gravity, Urine  1.034 (H) 1.005 - 1.030   pH 5.0 5.0 - 8.0   Glucose, UA NEGATIVE NEGATIVE mg/dL   Hgb urine dipstick NEGATIVE NEGATIVE   Bilirubin Urine NEGATIVE NEGATIVE   Ketones, ur 20 (A) NEGATIVE mg/dL   Protein, ur 30 (A) NEGATIVE mg/dL   Nitrite NEGATIVE NEGATIVE   Leukocytes,Ua MODERATE (A) NEGATIVE   RBC / HPF 0-5 0 - 5 RBC/hpf   WBC, UA 6-10 0 - 5 WBC/hpf   Bacteria, UA NONE SEEN NONE SEEN   Squamous Epithelial / LPF 0-5 0 - 5   Mucus PRESENT     Comment: Performed at [facility], [address], [place], [place] [address]  Urine Drug Screen, Qualitative     Status: None   Collection Time: 12/27/19  5:23 AM  Result Value Ref Range   Tricyclic, Ur Screen NONE DETECTED NONE DETECTED   Amphetamines, Ur Screen NONE DETECTED NONE DETECTED   MDMA (Ecstasy)Ur Screen NONE DETECTED NONE DETECTED   Cocaine Metabolite,Ur [place] NONE DETECTED NONE DETECTED   Opiate, Ur Screen NONE DETECTED NONE DETECTED   Phencyclidine (PCP) Ur S NONE DETECTED NONE DETECTED   Cannabinoid 50 Ng, Ur [place] NONE DETECTED NONE DETECTED   Barbiturates, Ur Screen NONE DETECTED NONE DETECTED   Benzodiazepine, Ur Scrn NONE DETECTED NONE DETECTED   Methadone Scn, Ur NONE DETECTED NONE DETECTED    Comment: (NOTE) Tricyclics + metabolites, urine    Cutoff 1000 ng/mL Amphetamines + metabolites, urine  Cutoff 1000 ng/mL MDMA (Ecstasy), urine              Cutoff 500 ng/mL Cocaine Metabolite, urine          Cutoff 300 ng/mL Opiate + metabolites, urine        Cutoff 300 ng/mL Phencyclidine (PCP), urine         Cutoff 25 ng/mL Cannabinoid, urine                 Cutoff 50 ng/mL Barbiturates + metabolites, urine  Cutoff 200 ng/mL Benzodiazepine, urine              Cutoff 200 ng/mL Methadone, urine                   Cutoff 300 ng/mL The urine drug screen provides only a preliminary, unconfirmed analytical test result and should not be used for non-medical purposes. Clinical consideration and professional judgment should be  applied to any positive drug screen result due to possible interfering substances. A more specific alternate chemical method must be used in order to obtain a confirmed analytical result. Gas chromatography / mass spectrometry (GC/MS) is the preferred confirmat ory method. Performed at [facility], [address] [place], [place] [address]     Current Facility-Administered Medications  Medication Dose Route Frequency Provider Last Rate Last Admin  . carbidopa-levodopa (SINEMET IR) 25-250 MG per tablet immediate release 1 tablet  1 tablet Oral 8 times per day Paulette Blanch, MD   1 tablet at 12/27/19 1401  . cephALEXin (KEFLEX)  capsule 250 mg  250 mg Oral Q8H Paulette Blanch, MD   250 mg at 12/27/19 1403  . diclofenac (VOLTAREN) EC tablet 75 mg  75 mg Oral BID Paulette Blanch, MD   Stopped at 12/27/19 [phone]  . donepezil (ARICEPT) tablet 5 mg  5 mg Oral QHS Paulette Blanch, MD      . entacapone (COMTAN) tablet 200 mg  200 mg Oral 6 X Daily Paulette Blanch, MD   200 mg at 12/27/19 1401  . escitalopram (LEXAPRO) tablet 10 mg  10 mg Oral Daily Paulette Blanch, MD   Stopped at 12/27/19 [phone]  . furosemide (LASIX) tablet 40 mg  40 mg Oral Daily Paulette Blanch, MD   Stopped at 12/27/19 [phone]  . melatonin tablet 5 mg  5 mg Oral QHS Paulette Blanch, MD      . neomycin-polymyxin b-dexamethasone (MAXITROL) ophthalmic suspension 1 drop  1 drop Both Eyes QID Paulette Blanch, MD   Stopped at 12/27/19 0957  . pantoprazole (PROTONIX) EC tablet 40 mg  40 mg Oral Daily Paulette Blanch, MD   Stopped at 12/27/19 1001  . prednisoLONE acetate (PRED FORTE) 1 % ophthalmic suspension 1 drop  1 drop Left Eye Daily Paulette Blanch, MD   1 drop at 12/27/19 1157  . pregabalin (LYRICA) capsule 100 mg  100 mg Oral TID Paulette Blanch, MD   Stopped at 12/27/19 1002  . pregabalin (LYRICA) capsule 100 mg  100 mg Oral TID Paulette Blanch, MD   100 mg at 12/27/19 1402  . QUEtiapine (SEROQUEL) tablet 50 mg  50 mg Oral QHS Paulette Blanch, MD      .  tiZANidine (ZANAFLEX) tablet 4 mg  4 mg Oral Q8H PRN Paulette Blanch, MD      . traZODone (DESYREL) tablet 50 mg  50 mg Oral QHS Paulette Blanch, MD       Current Outpatient Medications  Medication Sig Dispense Refill  . acetaminophen (TYLENOL) 500 MG tablet Take 500 mg by mouth every 6 (six) hours as needed.    . carbidopa-levodopa (SINEMET IR) 25-250 MG tablet Take by mouth See admin instructions. Take 1 tablet every 2 hours eight times daily.    . diclofenac (VOLTAREN) 50 MG EC tablet Take 75 mg by mouth 2 (two) times daily.    . entacapone (COMTAN) 200 MG tablet Take 200 mg by mouth 6 (six) times daily. With carbidopa    . escitalopram (LEXAPRO) 10 MG tablet Take 10 mg by mouth daily.    . furosemide (LASIX) 40 MG tablet Take 40 mg by mouth daily.    Marland Kitchen neomycin-polymyxin-dexamethasone (MAXITROL) 0.1 % ophthalmic suspension Place 1 drop into both eyes 4 (four) times daily.    Marland Kitchen omeprazole (PRILOSEC) 20 MG capsule Take 1 capsule (20 mg total) by mouth daily. 90 capsule 0  . Pimavanserin Tartrate (NUPLAZID) 34 MG CAPS Take 34 mg by mouth daily.    . pregabalin (LYRICA) 100 MG capsule Take 100 mg by mouth 3 (three) times daily.    . QUEtiapine (SEROQUEL) 50 MG tablet Take 1 tablet (50 mg total) by mouth at bedtime. (Patient taking differently: Take 25-50 mg by mouth at bedtime. ) 30 tablet 1  . tiZANidine (ZANAFLEX) 4 MG tablet Take 4 mg by mouth every 8 (eight) hours as needed.    . traZODone (DESYREL) 50 MG tablet Take 50 mg by mouth at bedtime.     Marland Kitchen  donepezil (ARICEPT) 5 MG tablet Take 1 tablet (5 mg total) by mouth at bedtime. (Patient not taking: Reported on 12/27/2019) 30 tablet 0  . melatonin 5 MG TABS Take 1 tablet (5 mg total) by mouth at bedtime. (Patient not taking: Reported on 12/27/2019)  0    Musculoskeletal: Strength & Muscle Tone: abnormal Gait & Station: unsteady Patient leans: N/A  Psychiatric Specialty Exam: Physical Exam  Review of Systems  Blood pressure (!) 140/118, pulse  97, temperature 98.6 F (37 C), temperature source Oral, resp. rate (!) 27, height 5\' 7"  (1.702 m), weight 54.4 kg, SpO2 92 %.Body mass index is 18.79 kg/m.  General Appearance: currently sedated   Eye contact rapport --asleep   Speech --quiet due to sleep     Mood:  Reportedly somewhat depressed   Affect:  Somewhat reportedly depressed   Thought Process:  See above by history   Orientation:  Errors made due to psychosis and possible dementia   Thought Content:   delusions hallucinations  Suicidal Thoughts:  Not in history   Homicidal Thoughts:  none  Sedated   Judgement:  Worsening by report   Insight:  becoming poor   Psychomotor Activity: accelerated at home   Concentration:  Poor   Recall:  Poor   Fund of Knowledge:  Reducing   Language:  Preserved but illogical   Akathisia:  None   Handed:    AIMS (if indicated):     Stable family ---assets  ADL's lowering   Cognition:  Pending details but reportedly declining   Sleep:        Treatment Plan Summary:  Caucasian female with advanced parkinson's now with increasing cognitive change and psychosis ---Currently Seroquel not working and causing more sleepiness and all.   Begin low dose Zyprexa as it has less cholinergic and less parkinsonian risk and all but has more antipsychotic properties than Seroquel as well   LExapro as is for now   Disposition:   Need discussion with ED neuro and Hospitalist --depends on issues of dementia and mgt from neuro and psych point of view  Eulas Post, MD 12/27/2019 3:26 PM

## 2019-12-27 NOTE — ED Notes (Signed)
It is difficult to converse with patient. Pt changes her mind frequently, and has trouble forming clear words/sentences.   Pt seems to at least mostly be able to understand this RN, as she will respond appropriately to pain assessment questions  Per husband, her tremors make her right knee hurt worse. Pt with constant tremors at this time

## 2019-12-27 NOTE — ED Provider Notes (Addendum)
Discussed case with psychiatry, who recommends Neurology consultation for definitive evaluation of whether or not patient formally carries a diagnosis of dementia, as this will affect her disposition planning.  Consult order placed.  Seen last here by Dr. Irish Elders during her most recent admission. However, her primary neurologist is Dr. Curtis Sites, with [facility].    Lilia Pro., MD 12/27/19 1612    Lilia Pro., MD 12/27/19 2132

## 2019-12-27 NOTE — ED Notes (Signed)
Assumed care of patient. Patient confused reaching for thisd not there. Very figity and unable to control leg movements.patient able to follow commands when directed.patient incontinent of bladder. Peri care provided. Clean dry clothes and linen changed. Patient able to swallow her am pills with coaching. meds given as ordered. Pure whick to suction. saferty maintained. S/o at bedside. Will monitor.
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
## 2019-12-27 NOTE — ED Notes (Signed)
Pt continues to scream and yell. Unable to comfort pt at this time.

## 2019-12-27 NOTE — ED Notes (Signed)
pts husband at bedside, pt asleep in bed at this time
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
## 2019-12-27 NOTE — ED Notes (Signed)
Pharmacy consulted for med rec per pt recent consult to neurology with updated med list

## 2019-12-27 NOTE — ED Provider Notes (Signed)
Patient has been very agitated and has required IM Ativan.  This does not resulted in satisfactory sedation.  She continues to yell out "fire" constantly.  We will give IM Haldol.   Earleen Newport, MD 12/27/19 1038

## 2019-12-27 NOTE — ED Notes (Addendum)
S/o aware that patient awaiting bed status. Will be going home. Would like an update to any changes and if patients moves from ed to floor. S/o took all patients belongings home with him. Patient presently sleeping safety maintained.

## 2019-12-27 NOTE — ED Notes (Signed)
Pt notedly able to tell time on the analog wall clock. This RN is able to communicate with pt at this time. Pt able to express needs/desires

## 2019-12-27 NOTE — ED Provider Notes (Addendum)
Patient's husband called and states that he would like the patient to be transferred to [facility] since that is where her Neurology care is based.  Discussed with [facility] transfer center, who is currently at full capacity at all their facilities, but will discuss for potential wait list placement.  Discussed with [facility] Dr. Pearletha Forge who does accept the patient for transfer; confirmed with transfer center that they are still at capacity, but will put patient on the wait list.  We will continue to care for her here, and continue with neurology consultation tomorrow morning to assist with her care while she awaits transfer.   Lilia Pro., MD 12/27/19 [id number]    Lilia Pro., MD 12/28/19 1124

## 2019-12-27 NOTE — ED Triage Notes (Signed)
Pt brought in by EMS from home, husband called out for AMS. Husband states her mentation was normal earlier, states became altered and confused. Hx of the same and also aggressive behavior.

## 2019-12-27 NOTE — ED Notes (Signed)
Pt assisted to and from bedpan.

## 2019-12-27 NOTE — ED Notes (Signed)
S/o back to visit with patient. Patient continues to sleep. Vss. Safety maintained. Will monitor.

## 2019-12-27 NOTE — ED Notes (Signed)
Pt screaming and yelling uncontrollably. Attempted to redirect pt with no success. Pt given blanket and then asked for it to be removed.

## 2019-12-28 DIAGNOSIS — G20 Parkinson's disease: Secondary | ICD-10-CM

## 2019-12-28 DIAGNOSIS — F0281 Dementia in other diseases classified elsewhere with behavioral disturbance: Secondary | ICD-10-CM

## 2019-12-28 LAB — URINE CULTURE

## 2019-12-28 MED ORDER — CARBIDOPA-LEVODOPA 25-250 MG PO TABS
1.0000 | ORAL_TABLET | ORAL | Status: DC
  Administered 2019-12-28 – 2019-12-29 (×11): 1 via ORAL
  Filled 2019-12-28 (×19): qty 1

## 2019-12-28 MED ORDER — PIMAVANSERIN TARTRATE 34 MG PO CAPS
34.0000 mg | ORAL_CAPSULE | Freq: Every day | ORAL | Status: DC
  Administered 2019-12-28 – 2019-12-29 (×2): 34 mg via ORAL
  Filled 2019-12-28 (×3): qty 1

## 2019-12-28 NOTE — ED Notes (Signed)
Pt finished w/ lunch. Family sitting at bedside. Covered pt's legs/feet w/ blanket.

## 2019-12-28 NOTE — Consult Note (Signed)
Reason for Consult: PD and hallucinations  Requesting Physician: Dr. Ellender Hose   CC: Hallucinations and PD  HPI: Sarah Donaldson is an 69 y.o. adult  with medical history significant ofparkinson's, HLD, GERD, seen by Neurology at [facility] on 5/4-5//5 at which time shepresented to the emergency department for recurrent falls. She was found to have small SDH.  Patient has progressive PD and is managed by Neurology at [facility]. Coming in today with progressive confusion, hallucinations, decreased sleep. She is on Comptan and significant dose of sinemet that she can take every 2 hours.    Past Medical History:  Diagnosis Date  . Cancer ([place])    Non-hodgkin's lymphoma (in remission)  . GERD (gastroesophageal reflux disease)    well-controlled w/ omeprazole  . Hyperlipidemia   . Knee joint cyst, left 02/07/2018  . Parkinson disease ([place])   . Parkinson disease [facility])     Past Surgical History:  Procedure Laterality Date  . BACK SURGERY    . BUNIONECTOMY    . NASAL SEPTUM SURGERY    . RETINAL DETACHMENT SURGERY    . TOTAL KNEE ARTHROPLASTY Left 06/07/2019   Procedure: TOTAL KNEE ARTHROPLASTY;  Surgeon: Lovell Sheehan, MD;  Location: [facility] ORS;  Service: Orthopedics;  Laterality: Left;    Family History  Problem Relation Age of Onset  . Heart disease Mother   . Alcohol abuse Father   . Breast cancer Neg Hx   . Mental illness Neg Hx     Social History:  reports that she has quit smoking. She has never used smokeless tobacco. She reports current alcohol use. She reports that she does not use drugs.  Allergies  Allergen Reactions  . Percocet [Oxycodone-Acetaminophen]     Hallucination  . Baclofen     Caused numbness in mouth   . Codeine Sulfate Nausea And Vomiting  . Gabapentin Swelling    Mouth swelling   . Terazosin   . Tramadol Hcl     Interferes with Serotonin levels which affects her Parkinsons    Medications: I have reviewed the patient's current medications.  ROS: Unable to  obtain due to confusion  Physical Examination: Blood pressure 119/84, pulse 78, temperature 98.6 F (37 C), temperature source Oral, resp. rate (!) 27, height 5\' 7"  (1.702 m), weight 54.4 kg, SpO2 92 %.    Neurological Examination   Mental Status: Alert to name and occasionally location Cranial Nerves: II: Discs flat bilaterally; Visual fields grossly normal III,IV, VI: ptosis not present, extra-ocular motions intact bilaterally V,VII: smile symmetric, facial light touch sensation normal bilaterally VIII: hearing normal bilaterally Motor: Generalized weakness bilater Tone and bulk slight increase LE>UE Sensory: Pinprick and light touch intact throughout, bilaterally    Laboratory Studies:   Basic Metabolic Panel: Recent Labs  Lab 12/27/19 0144  NA 144  K 3.9  CL 109  CO2 23  GLUCOSE 118*  BUN 36*  CREATININE 0.44  CALCIUM 9.3    Liver Function Tests: Recent Labs  Lab 12/27/19 0144  AST 17  ALT <5  ALKPHOS 110  BILITOT 0.9  PROT 7.1  ALBUMIN 4.6   No results for input(s): LIPASE, AMYLASE in the last 168 hours. No results for input(s): AMMONIA in the last 168 hours.  CBC: Recent Labs  Lab 12/27/19 0144  WBC 9.2  NEUTROABS 6.6  HGB 13.9  HCT 43.7  MCV 90.3  PLT 270    Cardiac Enzymes: No results for input(s): CKTOTAL, CKMB, CKMBINDEX, TROPONINI in the last 168 hours.  BNP: Invalid input(s): POCBNP  CBG: No results for input(s): GLUCAP in the last 168 hours.  Microbiology: Results for orders placed or performed during the hospital encounter of 12/27/19  Urine culture     Status: Abnormal   Collection Time: 12/27/19  5:23 AM   Specimen: Urine, Random  Result Value Ref Range Status   Specimen Description   Final    URINE, RANDOM Performed at [facility], [address] [place], [place] [address]    Special Requests   Final    NONE Performed at [facility], [address] [place], [place] [address]    Culture  MULTIPLE SPECIES PRESENT, SUGGEST RECOLLECTION (A)  Final   Report Status 12/28/2019 FINAL  Final    Coagulation Studies: No results for input(s): LABPROT, INR in the last 72 hours.  Urinalysis:  Recent Labs  Lab 12/27/19 0523  COLORURINE AMBER*  LABSPEC 1.034*  PHURINE 5.0  GLUCOSEU NEGATIVE  HGBUR NEGATIVE  BILIRUBINUR NEGATIVE  KETONESUR 20*  PROTEINUR 30*  NITRITE NEGATIVE  LEUKOCYTESUR MODERATE*    Lipid Panel:  No results found for: CHOL, TRIG, HDL, CHOLHDL, VLDL, LDLCALC  HgbA1C: No results found for: HGBA1C  Urine Drug Screen:      Component Value Date/Time   LABOPIA NONE DETECTED 12/27/2019 0523   COCAINSCRNUR NONE DETECTED 12/27/2019 0523   LABBENZ NONE DETECTED 12/27/2019 0523   AMPHETMU NONE DETECTED 12/27/2019 0523   THCU NONE DETECTED 12/27/2019 0523   LABBARB NONE DETECTED 12/27/2019 0523    Alcohol Level:  Recent Labs  Lab 12/27/19 0144  ETH <10    Other results: EKG: normal EKG, normal sinus rhythm, unchanged from previous tracings.  Imaging: CT Head Wo Contrast  Result Date: 12/27/2019 CLINICAL DATA:  Hallucinations. EXAM: CT HEAD WITHOUT CONTRAST TECHNIQUE: Contiguous axial images were obtained from the base of the skull through the vertex without intravenous contrast. COMPARISON:  CT head dated Dec 14, 2019 FINDINGS: Brain: No evidence of acute infarction, hemorrhage, hydrocephalus, extra-axial collection or mass lesion/mass effect. Atrophy and chronic microvascular ischemic changes are noted. Vascular: No hyperdense vessel or unexpected calcification. Skull: Normal. Negative for fracture or focal lesion. Sinuses/Orbits: No acute finding. Other: None. IMPRESSION: No acute intracranial abnormality. Electronically Signed   By: Constance Holster M.D.   On: 12/27/2019 02:59   DG Chest Port 1 View  Result Date: 12/27/2019 CLINICAL DATA:  Hallucination, altered mental status EXAM: PORTABLE CHEST 1 VIEW COMPARISON:  09/25/2019 FINDINGS: The heart  size and mediastinal contours are within normal limits. Both lungs are clear. The visualized skeletal structures are unremarkable. IMPRESSION: No active disease. Electronically Signed   By: Donavan Foil M.D.   On: 12/27/2019 02:20     Assessment/Plan:  69 y.o. adult  with medical history significant ofparkinson's, HLD, GERD, seen by Neurology at [facility] on 5/4-5//5 at which time shepresented to the emergency department for recurrent falls. She was found to have small SDH.  Patient has progressive PD and is managed by Neurology at [facility]. Coming in today with progressive confusion, hallucinations, decreased sleep. She is on Comptan and significant dose of sinemet that she can take every 2 hours.    - Suspect condition could be medications induced but at same time don't know patient well enough to start adjusting her either antipsychotic medications or Parkinsonian ones.  - She is waiting for a bed at [facility] where she follows up for her Parkinson's disease 12/28/2019, 10:19 AM

## 2019-12-28 NOTE — ED Provider Notes (Signed)
Husband was able to get patient's new medication filled, Pimavanserin (Nuplazid) 34 mg.   This is a new medication for her, prescribed by her [place] specifically for Parkinson's psychosis. They were planning to initiate her on this before they presented to the ER.   Discussed this with [facility], Dr. Pearletha Forge, who confirms it is okay to start dispensing this to her.   Discussed this with pharmacy who will assist in ordering, to be taken once daily.     Lilia Pro., MD 12/28/19 2011

## 2019-12-28 NOTE — ED Provider Notes (Signed)
Briefly, the patient is here with increasing psychiatric disturbance, seeking inpatient psychiatric care.  However, she has a very complicated history including severe Parkinson's on high-dose Sinemet.  Patient has been seen by neurology in the past.  Patient has been accepted to [facility] for neuro and psychiatric evaluation, but psychiatry is requesting neurology evaluation regarding medication management and assessment here in the ED.  Consult placed.   Duffy Bruce, MD 12/28/19 [phone]

## 2019-12-28 NOTE — ED Notes (Signed)
VOL/Psych Consult completed/ Accepted to [facility] wait list

## 2019-12-28 NOTE — ED Notes (Signed)
Vol [facility] called for update [id number], patient on waiting list for bed

## 2019-12-28 NOTE — ED Notes (Signed)
Pt given meal tray from dietary at this time.

## 2019-12-28 NOTE — ED Notes (Signed)
shauna RN at [facility] transfer center given update regarding pt status at this time

## 2019-12-28 NOTE — Discharge Instructions (Addendum)
Please seek medical attention for any high fevers, chest pain, shortness of breath, change in behavior, persistent vomiting, bloody stool or any other new or concerning symptoms.  

## 2019-12-28 NOTE — ED Notes (Signed)
Pharmacy contacted to obtain missing dose of entacapone. Pharmacy states they are sending med at this time

## 2019-12-29 DIAGNOSIS — G20 Parkinson's disease: Secondary | ICD-10-CM | POA: Diagnosis not present

## 2019-12-29 MED ORDER — ACETAMINOPHEN 325 MG PO TABS
650.0000 mg | ORAL_TABLET | Freq: Once | ORAL | Status: AC
  Administered 2019-12-29: 650 mg via ORAL
  Filled 2019-12-29: qty 2

## 2019-12-29 MED ORDER — CEPHALEXIN 250 MG PO CAPS: 250.0000 mg | ORAL_CAPSULE | Freq: Three times a day (TID) | ORAL | 0 refills | Status: AC

## 2019-12-29 NOTE — ED Notes (Signed)
Pt cleaned and changed by this RN and Jeb. NT. New bed sheets and gown applied. New purewick and brief placed on patient at this time. Husband is now at bedside with patient. This RN sent pharmacy message about missing medication, patient and family updated about wait for medications.

## 2019-12-29 NOTE — ED Notes (Signed)
E-signature not working at this time. Pt and husband verbalized understanding of D/C instructions, prescriptions and follow up care with no further questions at this time. Pt in NAD and ambulatory at time of D/C. Medication from pharmacy returned to patient at time of discharge with 12 tablets left. Pt belongings sent with patient's husband at this time. Pt and husband verbalized no further concerns. Pt appears stable at time of D/C.

## 2019-12-29 NOTE — ED Notes (Signed)
Lunch tray and coke given to patient.  Family and patient would like to discuss the possibility of going home and following up from home. MD Kinner at bedside now.

## 2019-12-29 NOTE — ED Notes (Signed)
Pt adjusted in bed by this RN and Amy, RN.

## 2020-01-03 DIAGNOSIS — R202 Paresthesia of skin: Secondary | ICD-10-CM | POA: Diagnosis not present

## 2020-01-03 DIAGNOSIS — T50905A Adverse effect of unspecified drugs, medicaments and biological substances, initial encounter: Secondary | ICD-10-CM | POA: Diagnosis not present

## 2020-01-03 DIAGNOSIS — F060 Psychotic disorder with hallucinations due to known physiological condition: Secondary | ICD-10-CM | POA: Diagnosis not present

## 2020-01-03 DIAGNOSIS — G20 Parkinson's disease: Secondary | ICD-10-CM | POA: Diagnosis not present

## 2020-01-03 DIAGNOSIS — R42 Dizziness and giddiness: Secondary | ICD-10-CM | POA: Diagnosis not present

## 2020-01-03 DIAGNOSIS — G4752 REM sleep behavior disorder: Secondary | ICD-10-CM | POA: Diagnosis not present

## 2020-01-03 DIAGNOSIS — R441 Visual hallucinations: Secondary | ICD-10-CM | POA: Diagnosis not present

## 2020-01-03 DIAGNOSIS — R200 Anesthesia of skin: Secondary | ICD-10-CM | POA: Diagnosis not present

## 2020-01-05 ENCOUNTER — Other Ambulatory Visit: Payer: Self-pay

## 2020-01-05 ENCOUNTER — Other Ambulatory Visit (INDEPENDENT_AMBULATORY_CARE_PROVIDER_SITE_OTHER): Payer: Medicare Other | Admitting: Internal Medicine

## 2020-01-05 DIAGNOSIS — R300 Dysuria: Secondary | ICD-10-CM

## 2020-01-05 LAB — POCT URINALYSIS DIPSTICK
Blood, UA: NEGATIVE
Glucose, UA: NEGATIVE
Nitrite, UA: POSITIVE
Protein, UA: POSITIVE — AB
Spec Grav, UA: >=1.03 — AB (ref 1.010–1.025)
Urobilinogen, UA: 4 E.U./dL — AB
pH, UA: 6 (ref 5.0–8.0)

## 2020-01-05 MED ORDER — NITROFURANTOIN MACROCRYSTAL 50 MG PO CAPS
50.0000 mg | ORAL_CAPSULE | Freq: Every day | ORAL | 0 refills | Status: DC
Start: 2020-01-05 — End: 2020-03-28

## 2020-01-05 NOTE — Progress Notes (Signed)
Patient came in for a urine check due to pain when she urinates. Please review UA results.

## 2020-01-08 ENCOUNTER — Other Ambulatory Visit: Payer: Self-pay

## 2020-01-08 ENCOUNTER — Emergency Department: Payer: Medicare Other

## 2020-01-08 DIAGNOSIS — G20 Parkinson's disease: Secondary | ICD-10-CM | POA: Diagnosis not present

## 2020-01-08 DIAGNOSIS — Z79899 Other long term (current) drug therapy: Secondary | ICD-10-CM | POA: Insufficient documentation

## 2020-01-08 DIAGNOSIS — Y92099 Unspecified place in other non-institutional residence as the place of occurrence of the external cause: Secondary | ICD-10-CM | POA: Diagnosis not present

## 2020-01-08 DIAGNOSIS — S0990XA Unspecified injury of head, initial encounter: Secondary | ICD-10-CM | POA: Diagnosis not present

## 2020-01-08 DIAGNOSIS — S80212A Abrasion, left knee, initial encounter: Secondary | ICD-10-CM | POA: Diagnosis not present

## 2020-01-08 DIAGNOSIS — W1839XA Other fall on same level, initial encounter: Secondary | ICD-10-CM | POA: Diagnosis not present

## 2020-01-08 DIAGNOSIS — Y93H2 Activity, gardening and landscaping: Secondary | ICD-10-CM | POA: Insufficient documentation

## 2020-01-08 DIAGNOSIS — S0003XA Contusion of scalp, initial encounter: Secondary | ICD-10-CM | POA: Diagnosis not present

## 2020-01-08 DIAGNOSIS — Y999 Unspecified external cause status: Secondary | ICD-10-CM | POA: Insufficient documentation

## 2020-01-08 NOTE — ED Triage Notes (Signed)
Pt comes from home due to a fall that occurred prior to arrival. Pt states she walks with a walker due to Parkinson and that the walker went out from under her and she fell and hit her head on the ground and her buttock leg. Pt has hematoma/abraison on back of head and abrasion on left knee.   Pt is complaining of pain all over at this time.

## 2020-01-09 ENCOUNTER — Emergency Department
Admission: EM | Admit: 2020-01-09 | Discharge: 2020-01-09 | Disposition: A | Discharge status: Home / Self Care | Payer: Medicare Other | Attending: Emergency Medicine | Admitting: Emergency Medicine

## 2020-01-09 DIAGNOSIS — W19XXXA Unspecified fall, initial encounter: Secondary | ICD-10-CM

## 2020-01-09 DIAGNOSIS — S0990XA Unspecified injury of head, initial encounter: Secondary | ICD-10-CM

## 2020-01-09 DIAGNOSIS — S0003XA Contusion of scalp, initial encounter: Secondary | ICD-10-CM

## 2020-01-09 DIAGNOSIS — S80212A Abrasion, left knee, initial encounter: Secondary | ICD-10-CM

## 2020-01-09 NOTE — Discharge Instructions (Addendum)
You have been seen in the Emergency Department (ED) today for a fall.  Your work up does not show any concerning injuries.  Please take over-the-counter ibuprofen and/or Tylenol as needed for your pain (unless you have an allergy or your doctor as told you not to take them), or take any prescribed medication as instructed.  Keep your abrasion(s) clean and dry, and you may use a bit of antibiotic ointment as needed.  Take your regular medications (including your Parkinson's meds) as soon as you get home.  Please follow up with your doctor regarding today's Emergency Department (ED) visit and your recent fall.    Return to the ED if you have any headache, confusion, slurred speech, weakness/numbness of any arm or leg, or any increased pain.

## 2020-01-09 NOTE — ED Provider Notes (Signed)
[facility] Emergency Department Provider Note  ____________________________________________   First MD Initiated Contact with Patient 01/09/20 [phone]     (approximate)  I have reviewed the triage vital signs and the nursing notes.   HISTORY  Chief Complaint Fall    HPI Sarah Donaldson is a 69 y.o. adult with medical history as listed below  which notably includes Parkinson's disease for which she walks with a walker.  She presents tonight for evaluation after a fall at home.  She reports that she was outside and tried to bend over to pick up something or pull some weeds when she became of her balance and fell, striking the back of her head on the concrete.  She says she did not lose consciousness.  She also has an abrasion on her left knee.  She initially was reporting that the pain was severe but she was more comfortable and actually sleeping when I went to evaluate her.  She currently says her pain is mild.  She has not yet had her evening medications.  She did not sustain any other injuries.  The onset was acute and the episode was severe and nothing in particular made it better or worse.        Past Medical History:  Diagnosis Date  . Cancer ([place])    Non-hodgkin's lymphoma (in remission)  . GERD (gastroesophageal reflux disease)    well-controlled w/ omeprazole  . Hyperlipidemia   . Knee joint cyst, left 02/07/2018  . Parkinson disease ([place])   . Parkinson disease [facility])     Patient Active Problem List   Diagnosis Date Noted  . SDH (subdural hematoma) ([place]) 12/11/2019  . Falls frequently 12/11/2019  . S/P total knee replacement, left 06/07/2019  . Osteoarthritis of knee 04/27/2019  . Panic attacks 04/07/2019  . Psychosis ([place]) 04/04/2019  . Numbness 03/09/2018  . Lumbar spondylosis 03/07/2018  . Chronic pain of left knee 03/07/2018  . Chronic upper back pain 12/08/2017  . Benign neoplasm of hand 10/20/2017  . Chronic pain syndrome  10/20/2017  . Chronic pain of right lower extremity 10/20/2017  . Chronic myofascial pain 10/20/2017  . Numbness and tingling 07/12/2017  . Sleep difficulties 07/12/2017  . Low back pain 01/18/2017  . Sleep behavior disorder, REM 01/18/2017  . Lumbar radiculopathy 11/01/2016  . Follicular lymphoma of extranodal and solid organ sites [facility]) 05/01/2016  . Lymphoma ([place]) 04/30/2016  . Hallux limitus 07/27/2015  . Low back pain with sciatica 02/13/2015  . Bilateral low back pain with sciatica 02/13/2015  . H/O adenomatous polyp of colon 04/06/2014  . Hx of adenomatous colonic polyps 04/06/2014  . Back ache 01/25/2014  . Adiposity 01/25/2014  . REM sleep behavior disorder 01/25/2014  . Disordered sleep 01/25/2014  . Has a tremor 01/25/2014  . Obesity, unspecified 01/25/2014  . Sleep disorder 01/25/2014  . Backache 01/25/2014  . Tremor 01/25/2014  . Obesity 01/25/2014  . Difficulty hearing 06/08/2012  . Hearing loss 06/08/2012  . Anxiety disorder 06/06/2012  . Colon polyp 06/06/2012  . Clinical depression 06/06/2012  . Hypercholesteremia 06/06/2012  . Idiopathic Parkinson's disease ([place]) 06/06/2012  . Detached retina 06/06/2012  . Depressive disorder 06/06/2012  . Retinal detachment 06/06/2012  . Parkinson's disease ([place]) 06/06/2012    Past Surgical History:  Procedure Laterality Date  . BACK SURGERY    . BUNIONECTOMY    . NASAL SEPTUM SURGERY    . RETINAL DETACHMENT SURGERY    . TOTAL KNEE ARTHROPLASTY  Left 06/07/2019   Procedure: TOTAL KNEE ARTHROPLASTY;  Surgeon: Lovell Sheehan, MD;  Location: [facility] ORS;  Service: Orthopedics;  Laterality: Left;    Prior to Admission medications   Medication Sig Start Date End Date Taking? Authorizing Provider  acetaminophen (TYLENOL) 500 MG tablet Take 500 mg by mouth every 6 (six) hours as needed.    [provider]  carbidopa-levodopa (SINEMET IR) 25-250 MG tablet Take by mouth See admin instructions. Take 1 tablet every 2  hours eight times daily. 12/18/19   [provider]  diclofenac (VOLTAREN) 50 MG EC tablet Take 75 mg by mouth 2 (two) times daily.    [provider]  donepezil (ARICEPT) 5 MG tablet Take 1 tablet (5 mg total) by mouth at bedtime. Patient not taking: Reported on 12/27/2019 12/13/19   Nicole Kindred A, DO  entacapone (COMTAN) 200 MG tablet Take 200 mg by mouth 6 (six) times daily. With carbidopa    [provider]  escitalopram (LEXAPRO) 10 MG tablet Take 10 mg by mouth daily.    [provider]  furosemide (LASIX) 40 MG tablet Take 40 mg by mouth daily.    [provider]  melatonin 5 MG TABS Take 1 tablet (5 mg total) by mouth at bedtime. Patient not taking: Reported on 12/27/2019 12/13/19   Ezekiel Slocumb, DO  neomycin-polymyxin-dexamethasone (MAXITROL) 0.1 % ophthalmic suspension Place 1 drop into both eyes 4 (four) times daily.    [provider]  nitrofurantoin (MACRODANTIN) 50 MG capsule Take 1 capsule (50 mg total) by mouth at bedtime. 01/05/20   Cletis Athens, MD  omeprazole (PRILOSEC) 20 MG capsule Take 1 capsule (20 mg total) by mouth daily. 03/07/18 11/22/24  Vevelyn Francois, NP  Pimavanserin Tartrate (NUPLAZID) 34 MG CAPS Take 34 mg by mouth daily.    [provider]  pregabalin (LYRICA) 100 MG capsule Take 100 mg by mouth 3 (three) times daily. 11/18/19   [provider]  QUEtiapine (SEROQUEL) 50 MG tablet Take 1 tablet (50 mg total) by mouth at bedtime. Patient taking differently: Take 25-50 mg by mouth at bedtime.  04/04/19   Ursula Alert, MD  tiZANidine (ZANAFLEX) 4 MG tablet Take 4 mg by mouth every 8 (eight) hours as needed. 10/21/19   [provider]  traZODone (DESYREL) 50 MG tablet Take 50 mg by mouth at bedtime.  10/04/18   [provider]    Allergies Percocet [oxycodone-acetaminophen], Baclofen, Codeine sulfate, Gabapentin, Terazosin, and Tramadol hcl  Family History  Problem Relation  Age of Onset  . Heart disease Mother   . Alcohol abuse Father   . Breast cancer Neg Hx   . Mental illness Neg Hx     Social History Social History   Tobacco Use  . Smoking status: Former Research scientist (life sciences)  . Smokeless tobacco: Never Used  Substance Use Topics  . Alcohol use: Yes    Alcohol/week: 0.0 standard drinks  . Drug use: No    Review of Systems Constitutional: No fever/chills Eyes: No visual changes. ENT: No sore throat. Cardiovascular: Denies chest pain. Respiratory: Denies shortness of breath. Gastrointestinal: No abdominal pain.  No nausea, no vomiting.  No diarrhea.  No constipation. Genitourinary: Negative for dysuria. Musculoskeletal: Head injury with swelling to the back of the head.  Left knee abrasion.  Negative for neck pain.  Negative for back pain. Integumentary: Negative for rash. Neurological: Negative for headaches, focal weakness or numbness.   ____________________________________________   PHYSICAL EXAM:  VITAL  SIGNS: ED Triage Vitals  Enc Vitals Group     BP 01/08/20 2221 128/69     Pulse Rate 01/08/20 2221 88     Resp 01/08/20 2221 20     Temp 01/08/20 2221 98.3 F (36.8 C)     Temp Source 01/08/20 2221 Oral     SpO2 01/08/20 2221 97 %     Weight 01/08/20 2223 65.8 kg (145 lb)     Height 01/08/20 2223 1.676 m (5\' 6" )     Head Circumference --      Peak Flow --      Pain Score 01/08/20 2222 9     Pain Loc --      Pain Edu? --      Excl. in [place]? --     Constitutional: Alert and oriented.  No acute distress.  Sleeping when I checked on her but awakened easily to the light voice. Eyes: Conjunctivae are normal.  Left pupil has an abnormal appearance and she reports that she has had surgery on this side previously. Head: Hematoma on the occiput, no laceration.  No other visible injury, no battle sign and no raccoon eyes. Nose: No congestion/rhinnorhea. Mouth/Throat: Patient is wearing a mask. Neck: No stridor.  No meningeal signs.  No tenderness  to palpation of the cervical spine and no pain or tenderness with flexion, extension, nor rotation side to side. Cardiovascular: Normal rate, regular rhythm. Good peripheral circulation. Grossly normal heart sounds. Respiratory: Normal respiratory effort.  No retractions. Gastrointestinal: Soft and nontender. No distention.  Musculoskeletal: No lower extremity tenderness nor edema. No gross deformities of extremities. Neurologic: Mild resting tremor in her extremities.  Normal speech and language. No gross focal neurologic deficits are appreciated.  Skin:  Skin is warm, dry and intact. Psychiatric: Mood and affect are normal. Speech and behavior are normal.  ____________________________________________   LABS (all labs ordered are listed, but only abnormal results are displayed)  Labs Reviewed - No data to display ____________________________________________  EKG  No indication for emergent EKG ____________________________________________  RADIOLOGY I, Hinda Kehr, personally viewed and evaluated these images (plain radiographs) as part of my medical decision making, as well as reviewing the written report by the radiologist.  ED MD interpretation: No acute abnormality identified on head CT nor cervical spine CT.  Official radiology report(s): CT Head Wo Contrast  Result Date: 01/08/2020 CLINICAL DATA:  69 year old female with head trauma. EXAM: CT HEAD WITHOUT CONTRAST CT CERVICAL SPINE WITHOUT CONTRAST TECHNIQUE: Multidetector CT imaging of the head and cervical spine was performed following the standard protocol without intravenous contrast. Multiplanar CT image reconstructions of the cervical spine were also generated. COMPARISON:  Head CT dated 12/27/2019. FINDINGS: CT HEAD FINDINGS Brain: The ventricles and sulci appropriate size for patient's age. The gray-white matter discrimination is preserved. There is no acute intracranial hemorrhage. No mass effect or midline shift no  extra-axial fluid collection. Vascular: No hyperdense vessel or unexpected calcification. Skull: Normal. Negative for fracture or focal lesion. Sinuses/Orbits: No acute finding. Left scleral band. Other: None CT CERVICAL SPINE FINDINGS Alignment: No acute subluxation. Skull base and vertebrae: No acute fracture Soft tissues and spinal canal: No prevertebral fluid or swelling. No visible canal hematoma. Disc levels:  Multilevel degenerative changes. Upper chest: Negative. Other: Heterogeneous thyroid with left nodule. Stability for greater than 5 years implies benignity; no biopsy or followup indicated (ref: J Am Coll Radiol. 2015 Feb;12(2): 143-50).The reported stability is based on thyroid ultrasound report of 07/27/2019. Clinical correlation  is recommended. IMPRESSION: 1. No acute intracranial pathology. 2. No acute/traumatic cervical spine pathology. Electronically Signed   By: Anner Crete M.D.   On: 01/08/2020 23:02   CT Cervical Spine Wo Contrast  Result Date: 01/08/2020 CLINICAL DATA:  69 year old female with head trauma. EXAM: CT HEAD WITHOUT CONTRAST CT CERVICAL SPINE WITHOUT CONTRAST TECHNIQUE: Multidetector CT imaging of the head and cervical spine was performed following the standard protocol without intravenous contrast. Multiplanar CT image reconstructions of the cervical spine were also generated. COMPARISON:  Head CT dated 12/27/2019. FINDINGS: CT HEAD FINDINGS Brain: The ventricles and sulci appropriate size for patient's age. The gray-white matter discrimination is preserved. There is no acute intracranial hemorrhage. No mass effect or midline shift no extra-axial fluid collection. Vascular: No hyperdense vessel or unexpected calcification. Skull: Normal. Negative for fracture or focal lesion. Sinuses/Orbits: No acute finding. Left scleral band. Other: None CT CERVICAL SPINE FINDINGS Alignment: No acute subluxation. Skull base and vertebrae: No acute fracture Soft tissues and spinal canal:  No prevertebral fluid or swelling. No visible canal hematoma. Disc levels:  Multilevel degenerative changes. Upper chest: Negative. Other: Heterogeneous thyroid with left nodule. Stability for greater than 5 years implies benignity; no biopsy or followup indicated (ref: J Am Coll Radiol. 2015 Feb;12(2): 143-50).The reported stability is based on thyroid ultrasound report of 07/27/2019. Clinical correlation is recommended. IMPRESSION: 1. No acute intracranial pathology. 2. No acute/traumatic cervical spine pathology. Electronically Signed   By: Anner Crete M.D.   On: 01/08/2020 23:02    ____________________________________________   PROCEDURES   Procedure(s) performed (including Critical Care):  Procedures   ____________________________________________   INITIAL IMPRESSION / MDM / [place] / ED COURSE  As part of my medical decision making, I reviewed the following data within the [place] notes reviewed and incorporated, Old chart reviewed, Notes from prior ED visits and [place] Controlled Substance Database   Differential diagnosis includes, but is not limited to, mechanical fall, syncope, acute intracranial bleeding, skull fracture, hematoma, contusion, medication or drug side effect, ongoing Parkinson's disease.  The patient is unsteady at baseline uses a walker due to her Parkinson's disease.  She reports that it was a mechanical fall where she became over balance.  She has been in our emergency department for more than 3 hours and has been stable, and in fact was able to get some rest while awaiting evaluation.  Vital signs are stable within normal limits.  She has no other complaints or concerns and no evidence or concern for infectious process.  She is worried about getting her evening medications and I explained that given her reassuring physical exam and CT scans that demonstrate no evidence of an emergent findings such as intracranial bleeding  or skull or cervical spine fracture, would be able to discharge her home and she can take her medications as soon as possible if she gets home.  I gave my usual and customary return precautions.           ____________________________________________  FINAL CLINICAL IMPRESSION(S) / ED DIAGNOSES  Final diagnoses:  Fall, initial encounter  Hematoma of scalp, initial encounter  Minor head injury, initial encounter  Abrasion of left knee, initial encounter     MEDICATIONS GIVEN DURING THIS VISIT:  Medications - No data to display   ED Discharge Orders    None      *Please note:  Sarah Donaldson was evaluated in Emergency Department on 01/09/2020 for the symptoms described in  the history of present illness. She was evaluated in the context of the global COVID-19 pandemic, which necessitated consideration that the patient might be at risk for infection with the SARS-CoV-2 virus that causes COVID-19. Institutional protocols and algorithms that pertain to the evaluation of patients at risk for COVID-19 are in a state of rapid change based on information released by regulatory bodies including the CDC and federal and state organizations. These policies and algorithms were followed during the patient's care in the ED.  Some ED evaluations and interventions may be delayed as a result of limited staffing during the pandemic.*  Note:  This document was prepared using Dragon voice recognition software and may include unintentional dictation errors.   Hinda Kehr, MD 01/09/20 0127

## 2020-01-12 ENCOUNTER — Ambulatory Visit: Payer: Medicare Other | Attending: Nurse Practitioner

## 2020-01-16 ENCOUNTER — Other Ambulatory Visit: Payer: Self-pay

## 2020-01-16 ENCOUNTER — Ambulatory Visit
Admission: RE | Admit: 2020-01-16 | Discharge: 2020-01-16 | Disposition: A | Discharge status: Home / Self Care | Payer: Medicare Other | Source: Ambulatory Visit | Attending: Nurse Practitioner | Admitting: Nurse Practitioner

## 2020-01-16 DIAGNOSIS — S065XAA Traumatic subdural hemorrhage with loss of consciousness status unknown, initial encounter: Secondary | ICD-10-CM

## 2020-01-16 DIAGNOSIS — S065X9A Traumatic subdural hemorrhage with loss of consciousness of unspecified duration, initial encounter: Secondary | ICD-10-CM | POA: Diagnosis not present

## 2020-01-16 DIAGNOSIS — Z96652 Presence of left artificial knee joint: Secondary | ICD-10-CM | POA: Diagnosis not present

## 2020-01-16 DIAGNOSIS — Z471 Aftercare following joint replacement surgery: Secondary | ICD-10-CM | POA: Diagnosis not present

## 2020-01-17 DIAGNOSIS — F419 Anxiety disorder, unspecified: Secondary | ICD-10-CM | POA: Diagnosis not present

## 2020-01-17 DIAGNOSIS — E785 Hyperlipidemia, unspecified: Secondary | ICD-10-CM | POA: Diagnosis not present

## 2020-01-17 DIAGNOSIS — Z9181 History of falling: Secondary | ICD-10-CM | POA: Diagnosis not present

## 2020-01-17 DIAGNOSIS — G479 Sleep disorder, unspecified: Secondary | ICD-10-CM | POA: Diagnosis not present

## 2020-01-17 DIAGNOSIS — G20 Parkinson's disease: Secondary | ICD-10-CM | POA: Diagnosis not present

## 2020-01-17 DIAGNOSIS — F22 Delusional disorders: Secondary | ICD-10-CM | POA: Diagnosis not present

## 2020-01-17 DIAGNOSIS — R443 Hallucinations, unspecified: Secondary | ICD-10-CM | POA: Diagnosis not present

## 2020-01-17 DIAGNOSIS — M7918 Myalgia, other site: Secondary | ICD-10-CM | POA: Diagnosis not present

## 2020-01-17 DIAGNOSIS — Z87891 Personal history of nicotine dependence: Secondary | ICD-10-CM | POA: Diagnosis not present

## 2020-01-17 DIAGNOSIS — H3320 Serous retinal detachment, unspecified eye: Secondary | ICD-10-CM | POA: Diagnosis not present

## 2020-01-17 DIAGNOSIS — K219 Gastro-esophageal reflux disease without esophagitis: Secondary | ICD-10-CM | POA: Diagnosis not present

## 2020-01-17 DIAGNOSIS — R296 Repeated falls: Secondary | ICD-10-CM | POA: Diagnosis not present

## 2020-01-17 DIAGNOSIS — S065X0D Traumatic subdural hemorrhage without loss of consciousness, subsequent encounter: Secondary | ICD-10-CM | POA: Diagnosis not present

## 2020-01-17 DIAGNOSIS — Z8572 Personal history of non-Hodgkin lymphomas: Secondary | ICD-10-CM | POA: Diagnosis not present

## 2020-01-17 DIAGNOSIS — F0281 Dementia in other diseases classified elsewhere with behavioral disturbance: Secondary | ICD-10-CM | POA: Diagnosis not present

## 2020-02-10 DIAGNOSIS — R451 Restlessness and agitation: Secondary | ICD-10-CM | POA: Diagnosis not present

## 2020-02-10 DIAGNOSIS — R4182 Altered mental status, unspecified: Secondary | ICD-10-CM | POA: Diagnosis not present

## 2020-02-10 DIAGNOSIS — G20 Parkinson's disease: Secondary | ICD-10-CM | POA: Diagnosis not present

## 2020-02-12 DIAGNOSIS — R531 Weakness: Secondary | ICD-10-CM | POA: Diagnosis not present

## 2020-02-12 DIAGNOSIS — G20 Parkinson's disease: Secondary | ICD-10-CM | POA: Diagnosis not present

## 2020-02-12 DIAGNOSIS — R451 Restlessness and agitation: Secondary | ICD-10-CM | POA: Diagnosis not present

## 2020-02-12 DIAGNOSIS — Z743 Need for continuous supervision: Secondary | ICD-10-CM | POA: Diagnosis not present

## 2020-02-16 ENCOUNTER — Emergency Department
Admission: EM | Admit: 2020-02-16 | Discharge: 2020-02-18 | Disposition: A | Discharge status: Other Acute Inpatient Hospital | Payer: Medicare Other | Attending: Emergency Medicine | Admitting: Emergency Medicine

## 2020-02-16 ENCOUNTER — Other Ambulatory Visit: Payer: Self-pay

## 2020-02-16 ENCOUNTER — Emergency Department: Payer: Medicare Other

## 2020-02-16 DIAGNOSIS — F0281 Dementia in other diseases classified elsewhere with behavioral disturbance: Secondary | ICD-10-CM | POA: Diagnosis not present

## 2020-02-16 DIAGNOSIS — Z79899 Other long term (current) drug therapy: Secondary | ICD-10-CM | POA: Diagnosis not present

## 2020-02-16 DIAGNOSIS — R0602 Shortness of breath: Secondary | ICD-10-CM | POA: Diagnosis not present

## 2020-02-16 DIAGNOSIS — F29 Unspecified psychosis not due to a substance or known physiological condition: Secondary | ICD-10-CM | POA: Insufficient documentation

## 2020-02-16 DIAGNOSIS — G20 Parkinson's disease: Secondary | ICD-10-CM | POA: Insufficient documentation

## 2020-02-16 DIAGNOSIS — C8590 Non-Hodgkin lymphoma, unspecified, unspecified site: Secondary | ICD-10-CM | POA: Diagnosis not present

## 2020-02-16 DIAGNOSIS — R4182 Altered mental status, unspecified: Secondary | ICD-10-CM | POA: Diagnosis not present

## 2020-02-16 DIAGNOSIS — N390 Urinary tract infection, site not specified: Secondary | ICD-10-CM | POA: Diagnosis not present

## 2020-02-16 DIAGNOSIS — Z20822 Contact with and (suspected) exposure to covid-19: Secondary | ICD-10-CM | POA: Diagnosis not present

## 2020-02-16 DIAGNOSIS — S0990XA Unspecified injury of head, initial encounter: Secondary | ICD-10-CM | POA: Diagnosis not present

## 2020-02-16 DIAGNOSIS — F69 Unspecified disorder of adult personality and behavior: Secondary | ICD-10-CM | POA: Diagnosis not present

## 2020-02-16 DIAGNOSIS — Z87891 Personal history of nicotine dependence: Secondary | ICD-10-CM | POA: Insufficient documentation

## 2020-02-16 DIAGNOSIS — E860 Dehydration: Secondary | ICD-10-CM | POA: Insufficient documentation

## 2020-02-16 DIAGNOSIS — G3183 Dementia with Lewy bodies: Secondary | ICD-10-CM | POA: Diagnosis not present

## 2020-02-16 DIAGNOSIS — I10 Essential (primary) hypertension: Secondary | ICD-10-CM | POA: Diagnosis not present

## 2020-02-16 LAB — COMPREHENSIVE METABOLIC PANEL
ALT: <5 U/L (ref 0–44)
AST: 11 U/L — ABNORMAL LOW (ref 15–41)
Albumin: 4 g/dL (ref 3.5–5.0)
Alkaline Phosphatase: 99 U/L (ref 38–126)
Anion gap: 7 (ref 5–15)
BUN: 29 mg/dL — ABNORMAL HIGH (ref 8–23)
CO2: 29 mmol/L (ref 22–32)
Calcium: 8.9 mg/dL (ref 8.9–10.3)
Chloride: 107 mmol/L (ref 98–111)
Creatinine, Ser: 0.44 mg/dL (ref 0.44–1.00)
GFR calc Af Amer: >60 mL/min (ref >60)
GFR calc non Af Amer: >60 mL/min (ref >60)
Glucose, Bld: 102 mg/dL — ABNORMAL HIGH (ref 70–99)
Potassium: 3.3 mmol/L — ABNORMAL LOW (ref 3.5–5.1)
Sodium: 143 mmol/L (ref 135–145)
Total Bilirubin: 0.9 mg/dL (ref 0.3–1.2)
Total Protein: 6.2 g/dL — ABNORMAL LOW (ref 6.5–8.1)

## 2020-02-16 LAB — CBC WITH DIFFERENTIAL/PLATELET
Abs Immature Granulocytes: 0.02 10*3/uL (ref 0.00–0.07)
Basophils Absolute: 0 10*3/uL (ref 0.0–0.1)
Basophils Relative: 0 %
Eosinophils Absolute: 0.1 10*3/uL (ref 0.0–0.5)
Eosinophils Relative: 3 %
HCT: 39.8 % (ref 36.0–46.0)
Hemoglobin: 12.7 g/dL (ref 12.0–15.0)
Immature Granulocytes: 0 %
Lymphocytes Relative: 23 %
Lymphs Abs: 1.2 10*3/uL (ref 0.7–4.0)
MCH: 28.7 pg (ref 26.0–34.0)
MCHC: 31.9 g/dL (ref 30.0–36.0)
MCV: 89.8 fL (ref 80.0–100.0)
Monocytes Absolute: 0.5 10*3/uL (ref 0.1–1.0)
Monocytes Relative: 10 %
Neutro Abs: 3.3 10*3/uL (ref 1.7–7.7)
Neutrophils Relative %: 64 %
Platelets: 143 10*3/uL — ABNORMAL LOW (ref 150–400)
RBC: 4.43 MIL/uL (ref 3.87–5.11)
RDW: 13.4 % (ref 11.5–15.5)
WBC: 5.1 10*3/uL (ref 4.0–10.5)
nRBC: 0 % (ref 0.0–0.2)

## 2020-02-16 LAB — URINALYSIS, COMPLETE (UACMP) WITH MICROSCOPIC
Bacteria, UA: NONE SEEN
Glucose, UA: NEGATIVE mg/dL
Hgb urine dipstick: NEGATIVE
Ketones, ur: 20 mg/dL — AB
Nitrite: NEGATIVE
Protein, ur: 30 mg/dL — AB
Specific Gravity, Urine: 1.039 — ABNORMAL HIGH (ref 1.005–1.030)
pH: 5 (ref 5.0–8.0)

## 2020-02-16 LAB — ETHANOL: Alcohol, Ethyl (B): <10 mg/dL (ref <10)

## 2020-02-16 MED ORDER — DONEPEZIL HCL 5 MG PO TABS
5.0000 mg | ORAL_TABLET | Freq: Every day | ORAL | Status: DC
  Administered 2020-02-17: 5 mg via ORAL
  Filled 2020-02-16 (×2): qty 1

## 2020-02-16 MED ORDER — PIMAVANSERIN TARTRATE 34 MG PO CAPS: 34.0000 mg | ORAL_CAPSULE | Freq: Every day | ORAL | Status: DC

## 2020-02-16 MED ORDER — CLONAZEPAM 0.5 MG PO TABS
1.0000 mg | ORAL_TABLET | Freq: Four times a day (QID) | ORAL | Status: DC
  Administered 2020-02-17 – 2020-02-18 (×5): 1 mg via ORAL
  Filled 2020-02-16 (×6): qty 2

## 2020-02-16 MED ORDER — TRAZODONE HCL 50 MG PO TABS
25.0000 mg | ORAL_TABLET | Freq: Every day | ORAL | Status: DC
  Administered 2020-02-17: 50 mg via ORAL
  Filled 2020-02-16: qty 1

## 2020-02-16 MED ORDER — QUETIAPINE FUMARATE 25 MG PO TABS
25.0000 mg | ORAL_TABLET | Freq: Two times a day (BID) | ORAL | Status: DC
  Administered 2020-02-17: 75 mg via ORAL
  Administered 2020-02-17: 25 mg via ORAL
  Administered 2020-02-18: 75 mg via ORAL
  Filled 2020-02-16 (×2): qty 3
  Filled 2020-02-16: qty 1

## 2020-02-16 MED ORDER — PREGABALIN 50 MG PO CAPS
100.0000 mg | ORAL_CAPSULE | Freq: Three times a day (TID) | ORAL | Status: DC
  Administered 2020-02-17 – 2020-02-18 (×4): 100 mg via ORAL
  Filled 2020-02-16 (×5): qty 2

## 2020-02-16 MED ORDER — ENTACAPONE 200 MG PO TABS
200.0000 mg | ORAL_TABLET | ORAL | Status: DC
  Administered 2020-02-17 – 2020-02-18 (×7): 200 mg via ORAL
  Filled 2020-02-16 (×12): qty 1

## 2020-02-16 MED ORDER — CEPHALEXIN 500 MG PO CAPS
500.0000 mg | ORAL_CAPSULE | Freq: Two times a day (BID) | ORAL | Status: DC
Start: 2020-02-16 — End: 2020-02-18
  Administered 2020-02-17 – 2020-02-18 (×4): 500 mg via ORAL
  Filled 2020-02-16 (×4): qty 1

## 2020-02-16 MED ORDER — LACTATED RINGERS IV BOLUS
1000.0000 mL | Freq: Once | INTRAVENOUS | Status: AC
  Administered 2020-02-16: 1000 mL via INTRAVENOUS

## 2020-02-16 MED ORDER — CARBIDOPA-LEVODOPA 25-250 MG PO TABS
2.5000 | ORAL_TABLET | ORAL | Status: DC
  Administered 2020-02-17 – 2020-02-18 (×9): 2.5 via ORAL
  Filled 2020-02-16 (×16): qty 3

## 2020-02-16 MED ORDER — ESCITALOPRAM OXALATE 10 MG PO TABS
10.0000 mg | ORAL_TABLET | Freq: Every day | ORAL | Status: DC
  Administered 2020-02-17 – 2020-02-18 (×2): 10 mg via ORAL
  Filled 2020-02-16 (×2): qty 1

## 2020-02-16 NOTE — ED Notes (Signed)
Patient transported to CT 

## 2020-02-16 NOTE — ED Notes (Signed)
IV placed and blood sent to lab. Pt's husband bedside.

## 2020-02-16 NOTE — ED Notes (Addendum)
Spouse at bedside administering night time medications with ok from [facility]. Pt taking Sinemet 25-250 mg, Trazodone 50 mg, Melatonin 5 mg, and Seroquel 50 mg.

## 2020-02-16 NOTE — ED Triage Notes (Addendum)
Pt from home via [facility]. Pt's husband called d/t pt "rolling around outside in the dirt" when pt states she was "weeding the garden". Pt denies falling today but states she hit her head last night.  Pt has hx Parkinson's and dementia.  EMS VS: 162/108, P 68.  Pt is in stretcher, slurring words, denies ETOH use, denies pain. NAD noted.

## 2020-02-16 NOTE — ED Provider Notes (Signed)
[facility] Emergency Department Provider Note   ____________________________________________   First MD Initiated Contact with Patient 02/16/20 2226     (approximate)  I have reviewed the triage vital signs and the nursing notes.   HISTORY  Chief Complaint Altered Mental Status    HPI Sarah Donaldson is a 69 y.o. adult with past medical history of Parkinson disease and associated dementia with behavioral disturbances, hyperlipidemia, GERD, and frequent falls who presents to the ED for altered mental status. 90 of history is obtained from patient's husband due to her dementia. He states that she has been acting increasingly erratically over the past couple of days and not taking her medications as regularly. She had been more argumentative throughout the day today and he found her this evening rolling around in the dirt in the garden. Patient insists that she was gardening, however husband had found her face down in the dirt. He is not sure whether she could have fell and hit her head. Patient currently denies any complaints, states "I am fine".        Past Medical History:  Diagnosis Date  . Cancer ([place])    Non-hodgkin's lymphoma (in remission)  . GERD (gastroesophageal reflux disease)    well-controlled w/ omeprazole  . Hyperlipidemia   . Knee joint cyst, left 02/07/2018  . Parkinson disease ([place])   . Parkinson disease [facility])     Patient Active Problem List   Diagnosis Date Noted  . SDH (subdural hematoma) ([place]) 12/11/2019  . Falls frequently 12/11/2019  . S/P total knee replacement, left 06/07/2019  . Osteoarthritis of knee 04/27/2019  . Panic attacks 04/07/2019  . Psychosis ([place]) 04/04/2019  . Numbness 03/09/2018  . Lumbar spondylosis 03/07/2018  . Chronic pain of left knee 03/07/2018  . Chronic upper back pain 12/08/2017  . Benign neoplasm of hand 10/20/2017  . Chronic pain syndrome 10/20/2017  . Chronic pain of right lower  extremity 10/20/2017  . Chronic myofascial pain 10/20/2017  . Numbness and tingling 07/12/2017  . Sleep difficulties 07/12/2017  . Low back pain 01/18/2017  . Sleep behavior disorder, REM 01/18/2017  . Lumbar radiculopathy 11/01/2016  . Follicular lymphoma of extranodal and solid organ [facility]) 05/01/2016  . Lymphoma ([place]) 04/30/2016  . Hallux limitus 07/27/2015  . Low back pain with sciatica 02/13/2015  . Bilateral low back pain with sciatica 02/13/2015  . H/O adenomatous polyp of colon 04/06/2014  . Hx of adenomatous colonic polyps 04/06/2014  . Back ache 01/25/2014  . Adiposity 01/25/2014  . REM sleep behavior disorder 01/25/2014  . Disordered sleep 01/25/2014  . Has a tremor 01/25/2014  . Obesity, unspecified 01/25/2014  . Sleep disorder 01/25/2014  . Backache 01/25/2014  . Tremor 01/25/2014  . Obesity 01/25/2014  . Difficulty hearing 06/08/2012  . Hearing loss 06/08/2012  . Anxiety disorder 06/06/2012  . Colon polyp 06/06/2012  . Clinical depression 06/06/2012  . Hypercholesteremia 06/06/2012  . Idiopathic Parkinson's disease ([place]) 06/06/2012  . Detached retina 06/06/2012  . Depressive disorder 06/06/2012  . Retinal detachment 06/06/2012  . Parkinson's disease ([place]) 06/06/2012    Past Surgical History:  Procedure Laterality Date  . BACK SURGERY    . BUNIONECTOMY    . NASAL SEPTUM SURGERY    . RETINAL DETACHMENT SURGERY    . TOTAL KNEE ARTHROPLASTY Left 06/07/2019   Procedure: TOTAL KNEE ARTHROPLASTY;  Surgeon: Lovell Sheehan, MD;  Location: [facility] ORS;  Service: Orthopedics;  Laterality: Left;    Prior  to Admission medications   Medication Sig Start Date End Date Taking? Authorizing Provider  acetaminophen (TYLENOL) 500 MG tablet Take 500 mg by mouth 2 (two) times daily.    Yes [provider]  carbidopa-levodopa (SINEMET IR) 25-250 MG tablet Take 2 tablets by mouth See admin instructions. Take 2 and 1/2 tablets every 2 hours eight times daily.  12/18/19  Yes [provider]  clonazePAM (KLONOPIN) 2 MG tablet Take 1 mg by mouth in the morning, at noon, in the evening, and at bedtime.  02/16/20  Yes [provider]  diclofenac (VOLTAREN) 50 MG EC tablet Take 75 mg by mouth 2 (two) times daily.   Yes [provider]  entacapone (COMTAN) 200 MG tablet Take 200 mg by mouth 6 (six) times daily. With carbidopa   Yes [provider]  escitalopram (LEXAPRO) 10 MG tablet Take 10 mg by mouth daily.   Yes [provider]  neomycin-polymyxin-dexamethasone (MAXITROL) 0.1 % ophthalmic suspension Place 1 drop into both eyes 4 (four) times daily.   Yes [provider]  omeprazole (PRILOSEC) 20 MG capsule Take 1 capsule (20 mg total) by mouth daily. 03/07/18 11/22/24 Yes Vevelyn Francois, NP  prednisoLONE acetate (PRED FORTE) 1 % ophthalmic suspension Place 1 drop into the left eye 4 (four) times daily.   Yes [provider]  pregabalin (LYRICA) 100 MG capsule Take 100 mg by mouth 3 (three) times daily. 11/18/19  Yes [provider]  QUEtiapine (SEROQUEL) 50 MG tablet Take 1 tablet (50 mg total) by mouth at bedtime. Patient taking differently: Take 25-75 mg by mouth 2 (two) times daily. 3 tablets (75 mg) daily and 2 tablets (50 mg) at bedtime 04/04/19  Yes Eappen, Ria Clock, MD  traZODone (DESYREL) 50 MG tablet Take 25-50 mg by mouth at bedtime.  10/04/18  Yes [provider]  donepezil (ARICEPT) 5 MG tablet Take 1 tablet (5 mg total) by mouth at bedtime. Patient not taking: Reported on 12/27/2019 12/13/19   Nicole Kindred A, DO  furosemide (LASIX) 40 MG tablet Take 40 mg by mouth daily. Patient not taking: Reported on 02/16/2020    [provider]  melatonin 5 MG TABS Take 1 tablet (5 mg total) by mouth at bedtime. Patient not taking: Reported on 12/27/2019 12/13/19   Ezekiel Slocumb, DO  nitrofurantoin (MACRODANTIN) 50 MG capsule Take 1 capsule (50 mg total) by mouth at  bedtime. Patient not taking: Reported on 02/16/2020 01/05/20   Cletis Athens, MD  Pimavanserin Tartrate (NUPLAZID) 34 MG CAPS Take 34 mg by mouth daily.    [provider]  tiZANidine (ZANAFLEX) 4 MG tablet Take 4 mg by mouth every 8 (eight) hours as needed. Patient not taking: Reported on 02/16/2020 10/21/19   [provider]    Allergies Percocet [oxycodone-acetaminophen], Baclofen, Codeine sulfate, Gabapentin, Terazosin, and Tramadol hcl  Family History  Problem Relation Age of Onset  . Heart disease Mother   . Alcohol abuse Father   . Breast cancer Neg Hx   . Mental illness Neg Hx     Social History Social History   Tobacco Use  . Smoking status: Former Research scientist (life sciences)  . Smokeless tobacco: Never Used  Vaping Use  . Vaping Use: Never used  Substance Use Topics  . Alcohol use: Yes    Alcohol/week: 0.0 standard drinks  . Drug use: No    Review of Systems Unable to obtain secondary to dementia  ____________________________________________   PHYSICAL EXAM:  VITAL SIGNS:  ED Triage Vitals  Enc Vitals Group     BP 02/16/20 2208 (!) 126/107     Pulse Rate 02/16/20 2208 80     Resp 02/16/20 2208 18     Temp 02/16/20 2208 98.5 F (36.9 C)     Temp Source 02/16/20 2208 Oral     SpO2 02/16/20 2208 97 %     Weight 02/16/20 2207 145 lb 1 oz (65.8 kg)     Height 02/16/20 2207 5\' 6"  (1.676 m)     Head Circumference --      Peak Flow --      Pain Score 02/16/20 2207 0     Pain Loc --      Pain Edu? --      Excl. in [place]? --     Constitutional: Awake and alert. Eyes: Conjunctivae are normal. Head: Atraumatic. Nose: No congestion/rhinnorhea. Mouth/Throat: Mucous membranes are dry. Neck: Normal ROM Cardiovascular: Normal rate, regular rhythm. Grossly normal heart sounds. Respiratory: Normal respiratory effort.  No retractions. Lungs CTAB. Gastrointestinal: Soft and nontender. No distention. Genitourinary: deferred Musculoskeletal: No lower extremity tenderness  nor edema. Neurologic:  Normal speech and language. No gross focal neurologic deficits are appreciated. Skin:  Skin is warm, dry and intact. No rash noted. Psychiatric: Mood and affect are normal. Speech and behavior are normal.  ____________________________________________   LABS (all labs ordered are listed, but only abnormal results are displayed)  Labs Reviewed  CBC WITH DIFFERENTIAL/PLATELET - Abnormal; Notable for the following components:      Result Value   Platelets 143 (*)    All other components within normal limits  COMPREHENSIVE METABOLIC PANEL - Abnormal; Notable for the following components:   Potassium 3.3 (*)    Glucose, Bld 102 (*)    BUN 29 (*)    Total Protein 6.2 (*)    AST 11 (*)    All other components within normal limits  URINALYSIS, COMPLETE (UACMP) WITH MICROSCOPIC - Abnormal; Notable for the following components:   Color, Urine AMBER (*)    APPearance HAZY (*)    Specific Gravity, Urine 1.039 (*)    Bilirubin Urine MODERATE (*)    Ketones, ur 20 (*)    Protein, ur 30 (*)    Leukocytes,Ua MODERATE (*)    All other components within normal limits  URINE CULTURE  ETHANOL  URINE DRUG SCREEN, QUALITATIVE ([facility] ONLY)  SALICYLATE LEVEL  ACETAMINOPHEN LEVEL    PROCEDURES  Procedure(s) performed (including Critical Care):  Procedures   ____________________________________________   INITIAL IMPRESSION / ASSESSMENT AND PLAN / ED COURSE       69 year old female with past medical history of Parkinson disease and associated dementia with behavioral disturbances presents to the ED due to increasingly erratic behavior over the past couple of days and being found by her husband facedown in the dirt earlier this evening. She is awake and alert on arrival to the ED, acting more erratically and argumentative per husband but does not currently appear aggressive. Given unclear history of trauma, CT head was performed and negative for acute process. We will  screen labs and hydrate with IV fluids as patient appears clinically dehydrated. If lab work is unremarkable, she would likely benefit from psychiatric consultation for any medication adjustments.  Patient turned over to oncoming provider pending lab results and reassessment.      ____________________________________________   FINAL CLINICAL IMPRESSION(S) / ED DIAGNOSES  Final diagnoses:  Altered mental status, unspecified altered mental status type  Dementia due to Parkinson's disease with behavioral disturbance ([place])  Urinary tract infection without hematuria, site unspecified  Dehydration     ED Discharge Orders    None       Note:  This document was prepared using Dragon voice recognition software and may include unintentional dictation errors.   Blake Divine, MD 02/16/20 [phone]

## 2020-02-16 NOTE — ED Notes (Signed)
Pt up to bathroom with this RN. Pt back to bed after urinating.

## 2020-02-16 NOTE — ED Provider Notes (Signed)
-----------------------------------------   11:01 PM on 02/16/2020 -----------------------------------------  Assuming care from Dr. Charna Archer.  In short, Sarah Donaldson is a 69 y.o. adult with a chief complaint of AMS.  Refer to the original H&P for additional details.  The current plan of care is to evaluate medically.  If no acute medical abnormality is identified, will need psychiatric evaluation and disposition.   ----------------------------------------- 11:38 PM on 02/16/2020 -----------------------------------------  Patient's labs are generally reassuring.  The patient appears little bit dry and is receiving 1 L normal saline IV fluid bolus.  There are a few ketones in her urine and an equivocal UTI.  I am treating empirically with Keflex 500 mg p.o. twice daily x7 days and a urine culture is pending, but I anticipate that she will require psychiatric disposition because the is relatively minimal findings do not represent a substantial enough reason to explain her behavior and psychiatric issues.  Dr. Charna Archer has placed the patient under involuntary commitment given that she does not appear to have the capacity to make her own decisions and may try to leave the emergency department which could place herself or others at risk.  I have placed a consult for psychiatry.  The patient has been placed in psychiatric observation due to the need to provide a safe environment for the patient while obtaining psychiatric consultation and evaluation, as well as ongoing medical and medication management to treat the patient's condition.  The patient has been placed under full IVC at this time.   ----------------------------------------- 1:39 AM on 02/17/2020 -----------------------------------------  Discussed case in person with Rashaun from Psychiatry.    She evaluated the patient and they will be working on finding an appropriate geropsych facility to which the patient can be transferred.  She  remains under psych observation hold.   Hinda Kehr, MD 02/17/20 [phone]

## 2020-02-17 ENCOUNTER — Emergency Department: Payer: Medicare Other

## 2020-02-17 DIAGNOSIS — F29 Unspecified psychosis not due to a substance or known physiological condition: Secondary | ICD-10-CM | POA: Diagnosis not present

## 2020-02-17 DIAGNOSIS — R4182 Altered mental status, unspecified: Secondary | ICD-10-CM

## 2020-02-17 DIAGNOSIS — F0280 Dementia in other diseases classified elsewhere without behavioral disturbance: Secondary | ICD-10-CM | POA: Insufficient documentation

## 2020-02-17 DIAGNOSIS — R0602 Shortness of breath: Secondary | ICD-10-CM | POA: Diagnosis not present

## 2020-02-17 DIAGNOSIS — G20 Parkinson's disease: Secondary | ICD-10-CM

## 2020-02-17 DIAGNOSIS — F0281 Dementia in other diseases classified elsewhere with behavioral disturbance: Secondary | ICD-10-CM

## 2020-02-17 LAB — SARS CORONAVIRUS 2 BY RT PCR (HOSPITAL ORDER, PERFORMED IN ~~LOC~~ HOSPITAL LAB): SARS Coronavirus 2: NEGATIVE

## 2020-02-17 LAB — URINE DRUG SCREEN, QUALITATIVE (ARMC ONLY)
Amphetamines, Ur Screen: NOT DETECTED
Barbiturates, Ur Screen: NOT DETECTED
Benzodiazepine, Ur Scrn: POSITIVE — AB
Cannabinoid 50 Ng, Ur ~~LOC~~: NOT DETECTED
Cocaine Metabolite,Ur ~~LOC~~: NOT DETECTED
MDMA (Ecstasy)Ur Screen: NOT DETECTED
Methadone Scn, Ur: NOT DETECTED
Opiate, Ur Screen: NOT DETECTED
Phencyclidine (PCP) Ur S: NOT DETECTED
Tricyclic, Ur Screen: POSITIVE — AB

## 2020-02-17 LAB — SALICYLATE LEVEL: Salicylate Lvl: <7 mg/dL — ABNORMAL LOW (ref 7.0–30.0)

## 2020-02-17 LAB — ACETAMINOPHEN LEVEL: Acetaminophen (Tylenol), Serum: <10 ug/mL — ABNORMAL LOW (ref 10–30)

## 2020-02-17 NOTE — BH Assessment (Signed)
It was communicated during day shift that patient has a potential bed for tomorrow 02/18/20 but [place] intake staff did not call back with bed assignment or transfer information. This Probation officer contacted [facility] [phone]) to obtain bed assignment and transfer information but there are no intake staff available tonight. Staff reports to contact back in the morning to obtain transfer information. Will communicate this to dayshift TTS.

## 2020-02-17 NOTE — ED Notes (Signed)
Pt placed on bedpan at this time.

## 2020-02-17 NOTE — ED Notes (Signed)
Pt dressed out from blue shorts, orange shirt, and gray bra into hospital provided burgundy scrubs.

## 2020-02-17 NOTE — BH Assessment (Signed)
Assessment Note  Sarah Donaldson is an 69 y.o. adult presenting to [facility] ED initially voluntary but has since been IVC'd by attending ER doctor. Per triage note Pt from home via [facility]. Pt's husband called d/t pt "rolling around outside in the dirt" when pt states she was "weeding the garden". Pt denies falling today but states she hit her head last night. During assessment patient was alert and oriented x4, calm and cooperative, somewhat anxious and irritable and presenting with her husband. When asked why patient was presenting to ED patient reported "because he said I was rolling in the dirt, but I wasn't I was planting plants." Patient's husband was able to show psychiatric team a picture of patient laying in the dirt. Patient continues to deny that she was rolling around. Patient does report experiencing VH and reports "I see people walking around in my back yard, I tell them to go away." Patient reports experiencing these [place] for "a couple of months." Patient denies seeing any VH tonight and reports she last saw her VH "a couple of days ago." Patient is currently prescribed psychiatric medication but gets her medications through her Neurologist and primary care doctor, patient does not currently have a psychiatrist or therapist. Patient denies SI/HI/VH and does not appear to be responding to any internal or external stimuli.  Per patient's husband Mireille Lacombe he reports "our doctor told us that she needs inpatient treatment for Geriatrics and he told me to call [name], I called there but [facility] doesn't have a psychiatric Geri unit but they gave me a list."   Per Psyc NP patient is recommended for Geriatric Inpatient Hospitalization   Diagnosis: Acute Psychosis  Past Medical History:  Past Medical History:  Diagnosis Date  . Cancer ([place])    Non-hodgkin's lymphoma (in remission)  . GERD (gastroesophageal reflux disease)    well-controlled w/ omeprazole  . Hyperlipidemia   . Knee joint cyst,  left 02/07/2018  . Parkinson disease ([place])   . Parkinson disease [facility])     Past Surgical History:  Procedure Laterality Date  . BACK SURGERY    . BUNIONECTOMY    . NASAL SEPTUM SURGERY    . RETINAL DETACHMENT SURGERY    . TOTAL KNEE ARTHROPLASTY Left 06/07/2019   Procedure: TOTAL KNEE ARTHROPLASTY;  Surgeon: Lovell Sheehan, MD;  Location: [facility] ORS;  Service: Orthopedics;  Laterality: Left;    Family History:  Family History  Problem Relation Age of Onset  . Heart disease Mother   . Alcohol abuse Father   . Breast cancer Neg Hx   . Mental illness Neg Hx     Social History:  reports that she has quit smoking. She has never used smokeless tobacco. She reports current alcohol use. She reports that she does not use drugs.  Additional Social History:  Alcohol / Drug Use Pain Medications: See MAR Prescriptions: See MAR Over the Counter: See MAR History of alcohol / drug use?: No history of alcohol / drug abuse  CIWA: CIWA-Ar BP: 108/72 Pulse Rate: 82 COWS:    Allergies:  Allergies  Allergen Reactions  . Percocet [Oxycodone-Acetaminophen]     Hallucination  . Baclofen     Caused numbness in mouth   . Codeine Sulfate Nausea And Vomiting  . Gabapentin Swelling    Mouth swelling   . Terazosin   . Tramadol Hcl     Interferes with Serotonin levels which affects her Parkinsons    Home Medications: (Not in a  hospital admission)   OB/GYN Status:  No LMP recorded. Patient is postmenopausal.  General Assessment Data Location of Assessment: [facility] ED TTS Assessment: In system Is this a Tele or Face-to-Face Assessment?: Face-to-Face Is this an Initial Assessment or a Re-assessment for this encounter?: Initial Assessment Patient Accompanied by:: Adult (Husband) Permission Given to speak with another: Yes Name, Relationship and Phone Number: Sarah Donaldson [phone] Language Other than English: No Living Arrangements: Other (Comment) ([place]) What gender do you  identify as?: Female Marital status: Married Pregnancy Status: No Living Arrangements: Spouse/significant other Can pt return to current living arrangement?: Yes Admission Status: Involuntary Petitioner: ED Attending Is patient capable of signing voluntary admission?: No Referral Source: Self/Family/Friend Insurance type: Medicare  Medical Screening Exam ([place]) Medical Exam completed: Yes  Crisis Care Plan Living Arrangements: Spouse/significant other Legal Guardian: Other: (Self) Name of Psychiatrist: None Name of Therapist: None  Education Status Is patient currently in school?: No Is the patient employed, unemployed or receiving disability?: Receiving disability income  Risk to self with the past 6 months Suicidal Ideation: No Has patient been a risk to self within the past 6 months prior to admission? : No Suicidal Intent: No Has patient had any suicidal intent within the past 6 months prior to admission? : No Is patient at risk for suicide?: No Suicidal Plan?: No Has patient had any suicidal plan within the past 6 months prior to admission? : No Access to Means: No What has been your use of drugs/alcohol within the last 12 months?: None Previous Attempts/Gestures: No How many times?: 0 Other Self Harm Risks: None Triggers for Past Attempts: None known Intentional Self Injurious Behavior: None Family Suicide History: No Recent stressful life event(s): Other (Comment) (None reported) Persecutory voices/beliefs?: No Depression: Yes Depression Symptoms: Loss of interest in usual pleasures, Feeling worthless/self pity, Feeling angry/irritable Substance abuse history and/or treatment for substance abuse?: No Suicide prevention information given to non-admitted patients: Not applicable  Risk to Others within the past 6 months Homicidal Ideation: No Does patient have any lifetime risk of violence toward others beyond the six months prior to admission? :  No Thoughts of Harm to Others: No Current Homicidal Intent: No Current Homicidal Plan: No Access to Homicidal Means: No Identified Victim: None History of harm to others?: No Assessment of Violence: None Noted Violent Behavior Description: None Does patient have access to weapons?: No Criminal Charges Pending?: No Does patient have a court date: No Is patient on probation?: No  Psychosis Hallucinations: Visual Delusions: Persecutory  Mental Status Report Appearance/Hygiene: In scrubs Eye Contact: Fair Motor Activity: Freedom of movement Speech: Pressured Level of Consciousness: Alert Mood: Anxious, Irritable Affect: Appropriate to circumstance Anxiety Level: Moderate Thought Processes: Coherent Judgement: Unimpaired Orientation: Person, Place, Time, Situation Obsessive Compulsive Thoughts/Behaviors: None  Cognitive Functioning Concentration: Normal Memory: Recent Intact, Remote Intact Is patient IDD: No Insight: Poor Impulse Control: Fair Appetite: Good Have you had any weight changes? : No Change Sleep: No Change Total Hours of Sleep: 8 Vegetative Symptoms: None  ADLScreening [facility] Assessment Services) Patient's cognitive ability adequate to safely complete daily activities?: Yes Patient able to express need for assistance with ADLs?: Yes Independently performs ADLs?: Yes (appropriate for developmental age)  Prior Inpatient Therapy Prior Inpatient Therapy: No  Prior Outpatient Therapy Prior Outpatient Therapy: No Does patient have an ACCT team?: No Does patient have Intensive In-House Services?  : No Does patient have [facility] services? : No Does patient have P4CC services?: No  ADL Screening (condition at time of admission) Patient's cognitive ability adequate to safely complete daily activities?: Yes Is the patient deaf or have difficulty hearing?: No Does the patient have difficulty seeing, even when wearing glasses/contacts?: No Does the patient have  difficulty concentrating, remembering, or making decisions?: No Patient able to express need for assistance with ADLs?: Yes Does the patient have difficulty dressing or bathing?: No Independently performs ADLs?: Yes (appropriate for developmental age) Does the patient have difficulty walking or climbing stairs?: No Weakness of Legs: None Weakness of Arms/Hands: None  Home Assistive Devices/Equipment Home Assistive Devices/Equipment: Environmental consultant (specify type)  Therapy Consults (therapy consults require a physician order) PT Evaluation Needed: No OT Evalulation Needed: No SLP Evaluation Needed: No Abuse/Neglect Assessment (Assessment to be complete while patient is alone) Abuse/Neglect Assessment Can Be Completed: Yes Physical Abuse: Denies Verbal Abuse: Denies Sexual Abuse: Denies Exploitation of patient/patient's resources: Denies Self-Neglect: Denies Values / Beliefs Cultural Requests During Hospitalization: None Spiritual Requests During Hospitalization: None Consults Spiritual Care Consult Needed: No Transition of Care Team Consult Needed: No Advance Directives (For Healthcare) Does Patient Have a Medical Advance Directive?: Unable to assess, patient is non-responsive or altered mental status          Disposition: Per Psyc NP patient is recommended for Geriatric Inpatient Hospitalization  Disposition Initial Assessment Completed for this Encounter: Yes  On Site Evaluation by:   Reviewed with Physician:    Leonie Douglas MS [place] 02/17/2020 2:05 AM

## 2020-02-17 NOTE — BH Assessment (Addendum)
Referral information for Psychiatric Hospitalization faxed to;   [facility] [phone]),   . [facility] [phone]- [phone]) [phone]  . [facility] ([phone]),  . [facility] [phone], [phone], [phone] or [phone]),   . [facility] [phone] -[phone]), Denied due to Dementia  . [facility] [phone]),   . [facility] [phone] or [phone])  . [place] [phone] or [phone]),

## 2020-02-17 NOTE — BH Assessment (Addendum)
Referral check for Psychiatric Hospitalization:   [facility] ([phone]- [phone]),    [facility] [phone]) [phone] Denied due to high fall risk status.    [facility] Hoes [phone]) Rep requested refax, task completed 9:36am.   [facility] [phone], [phone], [phone] or [phone]),    [facility] [phone]- [phone]), Denied due to [place] [phone]),    [facility] [phone] or [phone])   [facility] [phone] or [phone]),  Rep requested covid results, chest xray, ekg, and updated vitals. Task completed as of 3pm.

## 2020-02-17 NOTE — ED Notes (Signed)
Pt given dinner tray.

## 2020-02-17 NOTE — Consult Note (Signed)
[facility] Consult   Reason for Consult:  Psych evaluation  Referring Physician:  Dr. Karma Greaser Patient Identification: Sarah Donaldson MRN:  [id number] Principal Diagnosis: <principal problem not specified> Diagnosis:  Active Problems:   * No active hospital problems. *   Total Time spent with patient: 45 minutes  Subjective:  "I'm here because my husband said I was rolling in the dirt"   HPI:  Per TTS: Sarah Donaldson is an 69 y.o. adult presenting to [facility] ED initially voluntary but has since been IVC'd by attending ER doctor. Per triage note Pt from home via [facility]. Pt's husband called d/t pt "rolling around outside in the dirt" when pt states she was "weeding the garden".Pt denies falling today but states she hit her head last night. During assessment patient was alert and oriented x4, calm and cooperative, somewhat anxious and irritable and presenting with her husband. When asked why patient was presenting to ED patient reported "because he said I was rolling in the dirt, but I wasn't I was planting plants." Patient's husband was able to show psychiatric team a picture of patient laying in the dirt. Patient continues to deny that she was rolling around. Patient does report experiencing VH and reports "I see people walking around in my back yard, I tell them to go away." Patient reports experiencing these [place] for "a couple of months." Patient denies seeing any VH tonight and reports she last saw her VH "a couple of days ago." Patient is currently prescribed psychiatric medication but gets her medications through her Neurologist and primary care doctor, patient does not currently have a psychiatrist or therapist. Patient denies SI/HI/VH and does not appear to be responding to any internal or external stimuli.  Per patient's husband [name] he reports "our doctor told Sarah Donaldson that she needs inpatient treatment for Geriatrics and he told me to call [facility], I called there but  [facility] doesn't have a psychiatric Geri unit but they gave me a list."   Patient denies SI and HI. She is alert and oriented x4. Per her husband, she goes in and out of her "episodes".   Past Psychiatric History: Acute Psychosis   Risk to Self: Suicidal Ideation: No Suicidal Intent: No Is patient at risk for suicide?: No Suicidal Plan?: No Access to Means: No What has been your use of drugs/alcohol within the last 12 months?: None How many times?: 0 Other Self Harm Risks: None Triggers for Past Attempts: None known Intentional Self Injurious Behavior: None Risk to Others: Homicidal Ideation: No Thoughts of Harm to Others: No Current Homicidal Intent: No Current Homicidal Plan: No Access to Homicidal Means: No Identified Victim: None History of harm to others?: No Assessment of Violence: None Noted Violent Behavior Description: None Does patient have access to weapons?: No Criminal Charges Pending?: No Does patient have a court date: No Prior Inpatient Therapy: Prior Inpatient Therapy: No Prior Outpatient Therapy: Prior Outpatient Therapy: No Does patient have an ACCT team?: No Does patient have Intensive In-House Services?  : No Does patient have [facility] services? : No Does patient have P4CC services?: No  Past Medical History:  Past Medical History:  Diagnosis Date  . Cancer ([place])    Non-hodgkin's lymphoma (in remission)  . GERD (gastroesophageal reflux disease)    well-controlled w/ omeprazole  . Hyperlipidemia   . Knee joint cyst, left 02/07/2018  . Parkinson disease ([place])   . Parkinson disease [facility])     Past Surgical History:  Procedure Laterality Date  . BACK SURGERY    . BUNIONECTOMY    . NASAL SEPTUM SURGERY    . RETINAL DETACHMENT SURGERY    . TOTAL KNEE ARTHROPLASTY Left 06/07/2019   Procedure: TOTAL KNEE ARTHROPLASTY;  Surgeon: Lovell Sheehan, MD;  Location: [facility] ORS;  Service: Orthopedics;  Laterality: Left;   Family History:  Family History  Problem  Relation Age of Onset  . Heart disease Mother   . Alcohol abuse Father   . Breast cancer Neg Hx   . Mental illness Neg Hx    Family Psychiatric  History: unknown Social History:  Social History   Substance and Sexual Activity  Alcohol Use Yes  . Alcohol/week: 0.0 standard drinks     Social History   Substance and Sexual Activity  Drug Use No    Social History   Socioeconomic History  . Marital status: Married    Spouse name: Cherisse Carrell  . Number of children: 4  . Years of education: Not on file  . Highest education level: Not on file  Occupational History    Comment: retired  Tobacco Use  . Smoking status: Former Research scientist (life sciences)  . Smokeless tobacco: Never Used  Vaping Use  . Vaping Use: Never used  Substance and Sexual Activity  . Alcohol use: Yes    Alcohol/week: 0.0 standard drinks  . Drug use: No  . Sexual activity: Not Currently  Other Topics Concern  . Not on file  Social History Narrative  . Not on file   Social Determinants of Health   Financial Resource Strain: Low Risk   . Difficulty of Paying Living Expenses: Not hard at all  Food Insecurity: No Food Insecurity  . Worried About Charity fundraiser in the Last Year: Never true  . Ran Out of Food in the Last Year: Never true  Transportation Needs: No Transportation Needs  . Lack of Transportation (Medical): No  . Lack of Transportation (Non-Medical): No  Physical Activity: Inactive  . Days of Exercise per Week: 0 days  . Minutes of Exercise per Session: 0 min  Stress: Stress Concern Present  . Feeling of Stress : To some extent  Social Connections: Unknown  . Frequency of Communication with Friends and Family: Not on file  . Frequency of Social Gatherings with Friends and Family: Not on file  . Attends Religious Services: More than 4 times per year  . Active Member of Clubs or Organizations: No  . Attends Archivist Meetings: Never  . Marital Status: Married   Additional Social History:     Allergies:   Allergies  Allergen Reactions  . Percocet [Oxycodone-Acetaminophen]     Hallucination  . Baclofen     Caused numbness in mouth   . Codeine Sulfate Nausea And Vomiting  . Gabapentin Swelling    Mouth swelling   . Terazosin   . Tramadol Hcl     Interferes with Serotonin levels which affects her Parkinsons    Labs:  Results for orders placed or performed during the hospital encounter of 02/16/20 (from the past 48 hour(s))  CBC with Differential     Status: Abnormal   Collection Time: 02/16/20 10:55 PM  Result Value Ref Range   WBC 5.1 4.0 - 10.5 K/uL   RBC 4.43 3.87 - 5.11 MIL/uL   Hemoglobin 12.7 12.0 - 15.0 g/dL   HCT 39.8 36 - 46 %   MCV 89.8 80.0 - 100.0 fL   MCH 28.7 26.0 -  34.0 pg   MCHC 31.9 30.0 - 36.0 g/dL   RDW 13.4 11.5 - 15.5 %   Platelets 143 (L) 150 - 400 K/uL   nRBC 0.0 0.0 - 0.2 %   Neutrophils Relative % 64 %   Neutro Abs 3.3 1.7 - 7.7 K/uL   Lymphocytes Relative 23 %   Lymphs Abs 1.2 0.7 - 4.0 K/uL   Monocytes Relative 10 %   Monocytes Absolute 0.5 0 - 1 K/uL   Eosinophils Relative 3 %   Eosinophils Absolute 0.1 0 - 0 K/uL   Basophils Relative 0 %   Basophils Absolute 0.0 0 - 0 K/uL   Immature Granulocytes 0 %   Abs Immature Granulocytes 0.02 0.00 - 0.07 K/uL    Comment: Performed at [facility], [address] [place], [place] [address]  Comprehensive metabolic panel     Status: Abnormal   Collection Time: 02/16/20 10:55 PM  Result Value Ref Range   Sodium 143 135 - 145 mmol/L   Potassium 3.3 (L) 3.5 - 5.1 mmol/L   Chloride 107 98 - 111 mmol/L   CO2 29 22 - 32 mmol/L   Glucose, Bld 102 (H) 70 - 99 mg/dL    Comment: Glucose reference range applies only to samples taken after fasting for at least 8 hours.   BUN 29 (H) 8 - 23 mg/dL   Creatinine, Ser 0.44 0.44 - 1.00 mg/dL   Calcium 8.9 8.9 - 10.3 mg/dL   Total Protein 6.2 (L) 6.5 - 8.1 g/dL   Albumin 4.0 3.5 - 5.0 g/dL   AST 11 (L) 15 - 41 U/L   ALT <5 0 - 44 U/L    Alkaline Phosphatase 99 38 - 126 U/L   Total Bilirubin 0.9 0.3 - 1.2 mg/dL   GFR calc non Af Amer >60 >60 mL/min   GFR calc Af Amer >60 >60 mL/min   Anion gap 7 5 - 15    Comment: Performed at [facility], [address] [place], [place] [address]  Urinalysis, Complete w Microscopic     Status: Abnormal   Collection Time: 02/16/20 10:55 PM  Result Value Ref Range   Color, Urine AMBER (A) YELLOW    Comment: BIOCHEMICALS MAY BE AFFECTED BY COLOR   APPearance HAZY (A) CLEAR   Specific Gravity, Urine 1.039 (H) 1.005 - 1.030   pH 5.0 5.0 - 8.0   Glucose, UA NEGATIVE NEGATIVE mg/dL   Hgb urine dipstick NEGATIVE NEGATIVE   Bilirubin Urine MODERATE (A) NEGATIVE   Ketones, ur 20 (A) NEGATIVE mg/dL   Protein, ur 30 (A) NEGATIVE mg/dL   Nitrite NEGATIVE NEGATIVE   Leukocytes,Ua MODERATE (A) NEGATIVE   RBC / HPF 0-5 0 - 5 RBC/hpf   WBC, UA 11-20 0 - 5 WBC/hpf   Bacteria, UA NONE SEEN NONE SEEN   Squamous Epithelial / LPF 0-5 0 - 5   Mucus PRESENT    Ca Oxalate Crys, UA PRESENT     Comment: Performed at [facility], [address] [place],  [address]  Urine Drug Screen, Qualitative     Status: Abnormal   Collection Time: 02/16/20 10:55 PM  Result Value Ref Range   Tricyclic, Ur Screen POSITIVE (A) NONE DETECTED   Amphetamines, Ur Screen NONE DETECTED NONE DETECTED   MDMA (Ecstasy)Ur Screen NONE DETECTED NONE DETECTED   Cocaine Metabolite,Ur [place] NONE DETECTED NONE DETECTED   Opiate, Ur Screen NONE DETECTED NONE DETECTED   Phencyclidine (PCP) Ur  S NONE DETECTED NONE DETECTED   Cannabinoid 50 Ng, Ur [place] NONE DETECTED NONE DETECTED   Barbiturates, Ur Screen NONE DETECTED NONE DETECTED   Benzodiazepine, Ur Scrn POSITIVE (A) NONE DETECTED   Methadone Scn, Ur NONE DETECTED NONE DETECTED    Comment: (NOTE) Tricyclics + metabolites, urine    Cutoff 1000 ng/mL Amphetamines + metabolites, urine  Cutoff 1000 ng/mL MDMA (Ecstasy), urine              Cutoff 500  ng/mL Cocaine Metabolite, urine          Cutoff 300 ng/mL Opiate + metabolites, urine        Cutoff 300 ng/mL Phencyclidine (PCP), urine         Cutoff 25 ng/mL Cannabinoid, urine                 Cutoff 50 ng/mL Barbiturates + metabolites, urine  Cutoff 200 ng/mL Benzodiazepine, urine              Cutoff 200 ng/mL Methadone, urine                   Cutoff 300 ng/mL  The urine drug screen provides only a preliminary, unconfirmed analytical test result and should not be used for non-medical purposes. Clinical consideration and professional judgment should be applied to any positive drug screen result due to possible interfering substances. A more specific alternate chemical method must be used in order to obtain a confirmed analytical result. Gas chromatography / mass spectrometry (GC/MS) is the preferred confirm atory method. Performed at [facility], [address] [place], [place] [address]   Ethanol     Status: None   Collection Time: 02/16/20 10:55 PM  Result Value Ref Range   Alcohol, Ethyl (B) <10 <10 mg/dL    Comment: (NOTE) Lowest detectable limit for serum alcohol is 10 mg/dL.  For medical purposes only. Performed at [facility], [address] [place], [place] [address]   Salicylate level     Status: Abnormal   Collection Time: 02/16/20 10:55 PM  Result Value Ref Range   Salicylate Lvl <0.3 (L) 7.0 - 30.0 mg/dL    Comment: Performed at [facility], [address] [place], [place] [address]  Acetaminophen level     Status: Abnormal   Collection Time: 02/16/20 10:55 PM  Result Value Ref Range   Acetaminophen (Tylenol), Serum <10 (L) 10 - 30 ug/mL    Comment: (NOTE) Therapeutic concentrations vary significantly. A range of 10-30 ug/mL  may be an effective concentration for many patients. However, some  are best treated at concentrations outside of this range. Acetaminophen concentrations >150 ug/mL at 4 hours after ingestion  and  >50 ug/mL at 12 hours after ingestion are often associated with  toxic reactions.  Performed at [facility], [address], [place], [place] [address]     Current Facility-Administered Medications  Medication Dose Route Frequency Provider Last Rate Last Admin  . carbidopa-levodopa (SINEMET IR) 25-250 MG per tablet immediate release 2 tablet  2 tablet Oral See admin instructions Blake Divine, MD      . cephALEXin [facility]) capsule 500 mg  500 mg Oral Q12H Hinda Kehr, MD   500 mg at 02/17/20 0010  . clonazePAM (KLONOPIN) tablet 1 mg  1 mg Oral QID Blake Divine, MD      . donepezil (ARICEPT) tablet 5 mg  5 mg Oral QHS Blake Divine, MD      .  entacapone (COMTAN) tablet 200 mg  200 mg Oral 6 X Daily Blake Divine, MD      . escitalopram (LEXAPRO) tablet 10 mg  10 mg Oral Daily Blake Divine, MD      . Pimavanserin Tartrate CAPS 34 mg  34 mg Oral Daily Blake Divine, MD      . pregabalin (LYRICA) capsule 100 mg  100 mg Oral TID Blake Divine, MD      . QUEtiapine (SEROQUEL) tablet 25-75 mg  25-75 mg Oral BID Blake Divine, MD      . traZODone (DESYREL) tablet 25-50 mg  25-50 mg Oral QHS Blake Divine, MD       Current Outpatient Medications  Medication Sig Dispense Refill  . acetaminophen (TYLENOL) 500 MG tablet Take 500 mg by mouth 2 (two) times daily.     . carbidopa-levodopa (SINEMET IR) 25-250 MG tablet Take 2 tablets by mouth See admin instructions. Take 2 and 1/2 tablets every 2 hours eight times daily.    . clonazePAM (KLONOPIN) 2 MG tablet Take 1 mg by mouth in the morning, at noon, in the evening, and at bedtime.     . diclofenac (VOLTAREN) 50 MG EC tablet Take 75 mg by mouth 2 (two) times daily.    . entacapone (COMTAN) 200 MG tablet Take 200 mg by mouth 6 (six) times daily. With carbidopa    . escitalopram (LEXAPRO) 10 MG tablet Take 10 mg by mouth daily.    Marland Kitchen neomycin-polymyxin-dexamethasone (MAXITROL) 0.1 % ophthalmic suspension Place 1 drop into  both eyes 4 (four) times daily.    Marland Kitchen omeprazole (PRILOSEC) 20 MG capsule Take 1 capsule (20 mg total) by mouth daily. 90 capsule 0  . prednisoLONE acetate (PRED FORTE) 1 % ophthalmic suspension Place 1 drop into the left eye 4 (four) times daily.    . pregabalin (LYRICA) 100 MG capsule Take 100 mg by mouth 3 (three) times daily.    . QUEtiapine (SEROQUEL) 50 MG tablet Take 1 tablet (50 mg total) by mouth at bedtime. (Patient taking differently: Take 25-75 mg by mouth 2 (two) times daily. 3 tablets (75 mg) daily and 2 tablets (50 mg) at bedtime) 30 tablet 1  . traZODone (DESYREL) 50 MG tablet Take 25-50 mg by mouth at bedtime.     . donepezil (ARICEPT) 5 MG tablet Take 1 tablet (5 mg total) by mouth at bedtime. (Patient not taking: Reported on 12/27/2019) 30 tablet 0  . furosemide (LASIX) 40 MG tablet Take 40 mg by mouth daily. (Patient not taking: Reported on 02/16/2020)    . melatonin 5 MG TABS Take 1 tablet (5 mg total) by mouth at bedtime. (Patient not taking: Reported on 12/27/2019)  0  . nitrofurantoin (MACRODANTIN) 50 MG capsule Take 1 capsule (50 mg total) by mouth at bedtime. (Patient not taking: Reported on 02/16/2020) 10 capsule 0  . Pimavanserin Tartrate (NUPLAZID) 34 MG CAPS Take 34 mg by mouth daily.    Marland Kitchen tiZANidine (ZANAFLEX) 4 MG tablet Take 4 mg by mouth every 8 (eight) hours as needed. (Patient not taking: Reported on 02/16/2020)      Musculoskeletal: Strength & Muscle Tone: within normal limits Gait & Station: normal Patient leans: N/A  Psychiatric Specialty Exam: Physical Exam  Review of Systems  Blood pressure 117/73, pulse 64, temperature 98.5 F (36.9 C), temperature source Oral, resp. rate 18, height 5\' 6"  (1.676 m), weight 65.8 kg, SpO2 95 %.Body mass index is 23.41 kg/m.  General Appearance: Casual  Eye  Contact:  Minimal  Speech:  Clear and Coherent  Volume:  Normal  Mood:  Anxious and Irritable  Affect:  Congruent  Thought Process:  Coherent and Descriptions of  Associations: Loose  Orientation:  Full (Time, Place, and Person)  Thought Content:  WDL  Suicidal Thoughts:  No  Homicidal Thoughts:  No  Memory:  Immediate;   Fair  Judgement:  Fair  Insight:  Lacking  Psychomotor Activity:  Normal  Concentration:  Concentration: Fair  Recall:  [employer] of Knowledge:  Fair  Language:  Fair  Akathisia:  NA  Handed:  Right  AIMS (if indicated):     Assets:  Communication Skills Desire for Improvement Social Support  ADL's:  Intact  Cognition:  Impaired,  Moderate  Sleep:        Treatment Plan Summary: Daily contact with patient to assess and evaluate symptoms and progress in treatment and Medication management  Disposition: Recommend psychiatric Inpatient admission when medically cleared. Supportive therapy provided about ongoing stressors.  Deloria Lair, NP 02/17/2020 2:37 AM

## 2020-02-17 NOTE — ED Notes (Signed)
Pt attempting to got out of bed through open areas in bed rail. Seizure pads placed on bed rails for pt safety. Pt redirected to not get out of bed multiple times by this RN and Jeb NT.  Light in room is on, pt placed on bed alarms, sitter outside pt door.

## 2020-02-17 NOTE — ED Notes (Signed)
Report received from [place], Therapist, sports. Pt is currently sleeping in ED stretcher. Pt ED stretcher locked and lowest position. Respirations equal and unlabored at this time. Pt is NAD. Call light within reach.

## 2020-02-17 NOTE — BH Assessment (Signed)
Patient has a potential bed offer at [name]. Pt unable to be transferred until the AM per [facility].

## 2020-02-17 NOTE — ED Provider Notes (Signed)
EKG read interpreted by me shows normal sinus rhythm at 69 normal axis essentially normal EKG  Chest x-ray read by radiology reviewed by me is negative   Nena Polio, MD 02/17/20 1537

## 2020-02-17 NOTE — ED Notes (Signed)
Husband at bedside.  

## 2020-02-17 NOTE — ED Notes (Addendum)
NOTE MADE IN ERROR BY THIS RN

## 2020-02-17 NOTE — ED Notes (Signed)
Pt assisted to the bathroom with walker and 1-person assistance.

## 2020-02-18 DIAGNOSIS — Z973 Presence of spectacles and contact lenses: Secondary | ICD-10-CM | POA: Diagnosis not present

## 2020-02-18 DIAGNOSIS — R4182 Altered mental status, unspecified: Secondary | ICD-10-CM | POA: Diagnosis not present

## 2020-02-18 DIAGNOSIS — E860 Dehydration: Secondary | ICD-10-CM | POA: Diagnosis not present

## 2020-02-18 DIAGNOSIS — N3000 Acute cystitis without hematuria: Secondary | ICD-10-CM | POA: Diagnosis present

## 2020-02-18 DIAGNOSIS — G309 Alzheimer's disease, unspecified: Secondary | ICD-10-CM | POA: Diagnosis not present

## 2020-02-18 DIAGNOSIS — R296 Repeated falls: Secondary | ICD-10-CM | POA: Diagnosis not present

## 2020-02-18 DIAGNOSIS — F29 Unspecified psychosis not due to a substance or known physiological condition: Secondary | ICD-10-CM | POA: Diagnosis not present

## 2020-02-18 DIAGNOSIS — Z87891 Personal history of nicotine dependence: Secondary | ICD-10-CM | POA: Diagnosis not present

## 2020-02-18 DIAGNOSIS — G894 Chronic pain syndrome: Secondary | ICD-10-CM | POA: Diagnosis not present

## 2020-02-18 DIAGNOSIS — N390 Urinary tract infection, site not specified: Secondary | ICD-10-CM | POA: Diagnosis not present

## 2020-02-18 DIAGNOSIS — I2510 Atherosclerotic heart disease of native coronary artery without angina pectoris: Secondary | ICD-10-CM | POA: Diagnosis present

## 2020-02-18 DIAGNOSIS — F0391 Unspecified dementia with behavioral disturbance: Secondary | ICD-10-CM | POA: Diagnosis not present

## 2020-02-18 DIAGNOSIS — Z20822 Contact with and (suspected) exposure to covid-19: Secondary | ICD-10-CM | POA: Diagnosis not present

## 2020-02-18 DIAGNOSIS — C8590 Non-Hodgkin lymphoma, unspecified, unspecified site: Secondary | ICD-10-CM | POA: Diagnosis not present

## 2020-02-18 DIAGNOSIS — F0281 Dementia in other diseases classified elsewhere with behavioral disturbance: Secondary | ICD-10-CM | POA: Diagnosis present

## 2020-02-18 DIAGNOSIS — Z9181 History of falling: Secondary | ICD-10-CM | POA: Diagnosis not present

## 2020-02-18 DIAGNOSIS — M1990 Unspecified osteoarthritis, unspecified site: Secondary | ICD-10-CM | POA: Diagnosis present

## 2020-02-18 DIAGNOSIS — E876 Hypokalemia: Secondary | ICD-10-CM | POA: Diagnosis not present

## 2020-02-18 DIAGNOSIS — G8929 Other chronic pain: Secondary | ICD-10-CM | POA: Diagnosis present

## 2020-02-18 DIAGNOSIS — G20 Parkinson's disease: Secondary | ICD-10-CM | POA: Diagnosis present

## 2020-02-18 DIAGNOSIS — Z8572 Personal history of non-Hodgkin lymphomas: Secondary | ICD-10-CM | POA: Diagnosis not present

## 2020-02-18 DIAGNOSIS — I679 Cerebrovascular disease, unspecified: Secondary | ICD-10-CM | POA: Diagnosis not present

## 2020-02-18 DIAGNOSIS — K219 Gastro-esophageal reflux disease without esophagitis: Secondary | ICD-10-CM | POA: Diagnosis present

## 2020-02-18 DIAGNOSIS — Z96652 Presence of left artificial knee joint: Secondary | ICD-10-CM | POA: Diagnosis present

## 2020-02-18 DIAGNOSIS — Z79899 Other long term (current) drug therapy: Secondary | ICD-10-CM | POA: Diagnosis not present

## 2020-02-18 DIAGNOSIS — E785 Hyperlipidemia, unspecified: Secondary | ICD-10-CM | POA: Diagnosis present

## 2020-02-18 LAB — URINE CULTURE
Culture: <10000 — AB
Special Requests: NORMAL

## 2020-02-18 MED ORDER — HALOPERIDOL LACTATE 5 MG/ML IJ SOLN: 3.0000 mg | Freq: Once | INTRAMUSCULAR | Status: DC

## 2020-02-18 MED ORDER — LORAZEPAM 2 MG PO TABS
2.0000 mg | ORAL_TABLET | Freq: Once | ORAL | Status: AC
  Administered 2020-02-18: 2 mg via ORAL
  Filled 2020-02-18: qty 1

## 2020-02-18 MED ORDER — OLANZAPINE 10 MG IM SOLR
2.5000 mg | Freq: Once | INTRAMUSCULAR | Status: DC
  Filled 2020-02-18: qty 10

## 2020-02-18 MED ORDER — DIPHENHYDRAMINE HCL 50 MG/ML IJ SOLN
25.0000 mg | Freq: Once | INTRAMUSCULAR | Status: AC
  Administered 2020-02-18: 25 mg via INTRAMUSCULAR

## 2020-02-18 MED ORDER — OLANZAPINE 10 MG IM SOLR
2.5000 mg | Freq: Once | INTRAMUSCULAR | Status: AC
  Administered 2020-02-18: 2.5 mg via INTRAMUSCULAR
  Filled 2020-02-18 (×2): qty 10

## 2020-02-18 MED ORDER — HALOPERIDOL LACTATE 5 MG/ML IJ SOLN
2.0000 mg | Freq: Once | INTRAMUSCULAR | Status: DC
  Filled 2020-02-18: qty 1

## 2020-02-18 MED ORDER — HALOPERIDOL LACTATE 5 MG/ML IJ SOLN
2.0000 mg | Freq: Once | INTRAMUSCULAR | Status: AC
  Administered 2020-02-18: 2 mg via INTRAMUSCULAR

## 2020-02-18 MED ORDER — DIPHENHYDRAMINE HCL 50 MG/ML IJ SOLN
25.0000 mg | Freq: Once | INTRAMUSCULAR | Status: DC
  Filled 2020-02-18: qty 1

## 2020-02-18 NOTE — ED Notes (Signed)
Pt attempting to get out of bed. Pt informed she needs to stay in bed. Pt was slide up in bed by this RN and Edison Nasuti, EDT.

## 2020-02-18 NOTE — ED Notes (Signed)
Pt given meal tray.

## 2020-02-18 NOTE — ED Notes (Signed)
Verbal orders from Dr. Janese Banks placed. Duplicate orders d/c. One dose of benadryl and one dose of zyprexa given.

## 2020-02-18 NOTE — ED Notes (Signed)
Pt a two person assist with her walker to the toilet, pt able to ambulate using a shuffling gait and encouragement

## 2020-02-18 NOTE — ED Notes (Signed)
Pt returned from CT. Pt is noted to be resting in stretcher, rise and fall of chest noted.

## 2020-02-18 NOTE — ED Provider Notes (Signed)
Emergency Medicine Observation Re-evaluation Note  Sarah Donaldson is a 69 y.o. adult, seen on rounds today.  Pt initially presented to the ED for complaints of Altered Mental Status Currently, the patient is awaiting placement.  Physical Exam  BP 118/74 (BP Location: Left Arm)   Pulse 68   Temp (!) 97.5 F (36.4 C) (Oral)   Resp 18   Ht 5\' 6"  (1.676 m)   Wt 65.8 kg   SpO2 98%   BMI 23.41 kg/m  Physical Exam  Patient sleeping in bed comfortably. Constitutional patient in no distress resting comfortably in bed Respiratory: Patient breathing normally no retractions looks well  ED Course / MDM  EKG:    I have reviewed the labs performed to date as well as medications administered while in observation.  \ Plan  Patient awaiting placement is under involuntary commitment as noted above patient's urinalysis done on the ninth showed her urine to be somewhat concentrated.  Will encourage p.o. fluids.  Monitor for any symptoms of UTI.   Nena Polio, MD 02/18/20 [phone]

## 2020-02-18 NOTE — ED Notes (Signed)
Unable to get temp orally or axillary due to patient thrashing around and screaming

## 2020-02-18 NOTE — ED Notes (Signed)
This RN at bedside with pt in attempt to keep pt in the bed and safe, AC called for additional staffing support due to  Multiple pt's needing safety sitters, no additional sitters available at this time, this RN will stay with pt

## 2020-02-18 NOTE — ED Notes (Signed)
Pt placed on bedpan at this time.

## 2020-02-18 NOTE — BH Assessment (Addendum)
Patient has been accepted to [facility].  Patient assigned to: Bed [place] is Dr. Ananias Pilgrim.  Call report to [phone] Representative was [facility].    ER Staff is aware of it:  [facility] ER Fredonia Highland, ER MD  Levada Dy Patient's Nurse     Patient's Family/Support System (Spouse Lanae Crumbly), [phone]) have been updated as well.   Stanton Kidney is requesting for report to be given after transportation is in transit

## 2020-02-18 NOTE — ED Provider Notes (Signed)
Patient woke up and began crying inconsolably about something that happened with her husband earlier.  She is unable to stop crying.  We will try some Ativan to see if we can help her.   Nena Polio, MD 02/18/20 [phone]

## 2020-02-18 NOTE — ED Notes (Signed)
RN in room, pt crying uncontrollably. Pt taking about an incident with her husband. Pt unable to be consoled at this time. Will speak with MD about orders to help calm pt down.

## 2020-02-18 NOTE — ED Notes (Signed)
Pt unable to sign for transport due to AMS 

## 2020-02-18 NOTE — ED Notes (Addendum)
Pt attempting to get OOB via top of stretcher to "get something on the ground", pt redirected by this RN and Kadijah NT multiple times. Pt continues behavior even with redirecting.  Pt cleaned of incontinent episode, dried and placed clean sheets, and pad, and brief under pt by this RN and NT.  Pt's glasses placed in bag and placed on counter in pt's room.

## 2020-02-18 NOTE — ED Notes (Signed)
Pt having visual hallucinations. Pt reports that there are cats in her room. Pt was reassured that there are no cats in her room.

## 2020-02-18 NOTE — ED Notes (Signed)
Report given to [facility] at [facility]

## 2020-02-18 NOTE — ED Notes (Signed)
Patient transported to CT 

## 2020-02-18 NOTE — ED Notes (Signed)
Pt assisted with using bedpan.

## 2020-02-18 NOTE — ED Notes (Signed)
Pt attempted to jump out of the bed. Pt was assisted back into bed by this RN, Yong Channel, RN and Edison Nasuti, EDT. Charge RN informed that pt will need a Air cabin crew due to trying to get out of bed. Pt is very confused and is a high fall risk

## 2020-02-18 NOTE — ED Notes (Signed)
Pt urinated on bedpan, pt cleaned and dried by this RN.

## 2020-03-07 ENCOUNTER — Ambulatory Visit: Payer: No Typology Code available for payment source | Admitting: Internal Medicine

## 2020-03-07 ENCOUNTER — Encounter: Payer: Self-pay | Admitting: Internal Medicine

## 2020-03-07 ENCOUNTER — Other Ambulatory Visit: Payer: Self-pay

## 2020-03-07 ENCOUNTER — Ambulatory Visit (INDEPENDENT_AMBULATORY_CARE_PROVIDER_SITE_OTHER): Payer: Medicare Other | Admitting: Internal Medicine

## 2020-03-07 VITALS — BP 100/64 | HR 97

## 2020-03-07 DIAGNOSIS — R251 Tremor, unspecified: Secondary | ICD-10-CM | POA: Diagnosis not present

## 2020-03-07 DIAGNOSIS — F064 Anxiety disorder due to known physiological condition: Secondary | ICD-10-CM | POA: Diagnosis not present

## 2020-03-07 DIAGNOSIS — G4752 REM sleep behavior disorder: Secondary | ICD-10-CM | POA: Diagnosis not present

## 2020-03-07 DIAGNOSIS — F0281 Dementia in other diseases classified elsewhere with behavioral disturbance: Secondary | ICD-10-CM

## 2020-03-07 DIAGNOSIS — G20 Parkinson's disease: Secondary | ICD-10-CM

## 2020-03-07 DIAGNOSIS — M5416 Radiculopathy, lumbar region: Secondary | ICD-10-CM | POA: Diagnosis not present

## 2020-03-07 DIAGNOSIS — F02818 Dementia in other diseases classified elsewhere, unspecified severity, with other behavioral disturbance: Secondary | ICD-10-CM

## 2020-03-07 NOTE — Assessment & Plan Note (Signed)
Pt has parkinsonism with dementia. She was recently admited in the hospital for confusion which was worsening and innapproriate behavior with impaired judgement. She was seen by neurology and psychology and her dosage was adjusted. Pt is in baseline mental status right now.

## 2020-03-07 NOTE — Assessment & Plan Note (Signed)
Anxiety and depression is part of the parkinsonism syndrome. She does not have any hallucinations or delusion
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
## 2020-03-07 NOTE — Assessment & Plan Note (Signed)
Stable now. 

## 2020-03-07 NOTE — Assessment & Plan Note (Signed)
Parkinsonism disease is gradually getting worse. She is wheel chair bound. She does not have any dysphagia or aspiration problem.

## 2020-03-08 DIAGNOSIS — L89312 Pressure ulcer of right buttock, stage 2: Secondary | ICD-10-CM | POA: Diagnosis not present

## 2020-03-08 DIAGNOSIS — Z87891 Personal history of nicotine dependence: Secondary | ICD-10-CM | POA: Diagnosis not present

## 2020-03-08 DIAGNOSIS — F0281 Dementia in other diseases classified elsewhere with behavioral disturbance: Secondary | ICD-10-CM | POA: Diagnosis not present

## 2020-03-08 DIAGNOSIS — I679 Cerebrovascular disease, unspecified: Secondary | ICD-10-CM | POA: Diagnosis not present

## 2020-03-08 DIAGNOSIS — G629 Polyneuropathy, unspecified: Secondary | ICD-10-CM | POA: Diagnosis not present

## 2020-03-08 DIAGNOSIS — M810 Age-related osteoporosis without current pathological fracture: Secondary | ICD-10-CM | POA: Diagnosis not present

## 2020-03-08 DIAGNOSIS — F411 Generalized anxiety disorder: Secondary | ICD-10-CM | POA: Diagnosis not present

## 2020-03-08 DIAGNOSIS — Z4800 Encounter for change or removal of nonsurgical wound dressing: Secondary | ICD-10-CM | POA: Diagnosis not present

## 2020-03-08 DIAGNOSIS — G20 Parkinson's disease: Secondary | ICD-10-CM | POA: Diagnosis not present

## 2020-03-08 DIAGNOSIS — M1990 Unspecified osteoarthritis, unspecified site: Secondary | ICD-10-CM | POA: Diagnosis not present

## 2020-03-08 DIAGNOSIS — E785 Hyperlipidemia, unspecified: Secondary | ICD-10-CM | POA: Diagnosis not present

## 2020-03-08 DIAGNOSIS — K219 Gastro-esophageal reflux disease without esophagitis: Secondary | ICD-10-CM | POA: Diagnosis not present

## 2020-03-08 DIAGNOSIS — R296 Repeated falls: Secondary | ICD-10-CM | POA: Diagnosis not present

## 2020-03-08 DIAGNOSIS — G8929 Other chronic pain: Secondary | ICD-10-CM | POA: Diagnosis not present

## 2020-03-08 DIAGNOSIS — Z87440 Personal history of urinary (tract) infections: Secondary | ICD-10-CM | POA: Diagnosis not present

## 2020-03-08 DIAGNOSIS — Z8572 Personal history of non-Hodgkin lymphomas: Secondary | ICD-10-CM | POA: Diagnosis not present

## 2020-03-08 DIAGNOSIS — Z9181 History of falling: Secondary | ICD-10-CM | POA: Diagnosis not present

## 2020-03-08 NOTE — Addendum Note (Signed)
Addended by: Cletis Athens on: 03/08/2020 03:51 PM   Modules accepted: Level of Service

## 2020-03-12 DIAGNOSIS — G4752 REM sleep behavior disorder: Secondary | ICD-10-CM | POA: Diagnosis not present

## 2020-03-12 DIAGNOSIS — G20 Parkinson's disease: Secondary | ICD-10-CM | POA: Diagnosis not present

## 2020-03-12 DIAGNOSIS — R202 Paresthesia of skin: Secondary | ICD-10-CM | POA: Diagnosis not present

## 2020-03-12 DIAGNOSIS — R441 Visual hallucinations: Secondary | ICD-10-CM | POA: Diagnosis not present

## 2020-03-12 DIAGNOSIS — R200 Anesthesia of skin: Secondary | ICD-10-CM | POA: Diagnosis not present

## 2020-03-12 DIAGNOSIS — R42 Dizziness and giddiness: Secondary | ICD-10-CM | POA: Diagnosis not present

## 2020-03-13 DIAGNOSIS — G629 Polyneuropathy, unspecified: Secondary | ICD-10-CM | POA: Diagnosis not present

## 2020-03-13 DIAGNOSIS — G20 Parkinson's disease: Secondary | ICD-10-CM | POA: Diagnosis not present

## 2020-03-13 DIAGNOSIS — F0281 Dementia in other diseases classified elsewhere with behavioral disturbance: Secondary | ICD-10-CM | POA: Diagnosis not present

## 2020-03-13 DIAGNOSIS — L89312 Pressure ulcer of right buttock, stage 2: Secondary | ICD-10-CM | POA: Diagnosis not present

## 2020-03-13 DIAGNOSIS — M1990 Unspecified osteoarthritis, unspecified site: Secondary | ICD-10-CM | POA: Diagnosis not present

## 2020-03-13 DIAGNOSIS — G8929 Other chronic pain: Secondary | ICD-10-CM | POA: Diagnosis not present

## 2020-03-14 ENCOUNTER — Encounter: Payer: Self-pay | Admitting: Emergency Medicine

## 2020-03-14 ENCOUNTER — Emergency Department: Payer: Medicare Other

## 2020-03-14 DIAGNOSIS — Z859 Personal history of malignant neoplasm, unspecified: Secondary | ICD-10-CM | POA: Diagnosis not present

## 2020-03-14 DIAGNOSIS — S0990XA Unspecified injury of head, initial encounter: Secondary | ICD-10-CM | POA: Diagnosis not present

## 2020-03-14 DIAGNOSIS — Y939 Activity, unspecified: Secondary | ICD-10-CM | POA: Diagnosis not present

## 2020-03-14 DIAGNOSIS — R42 Dizziness and giddiness: Secondary | ICD-10-CM | POA: Diagnosis not present

## 2020-03-14 DIAGNOSIS — R296 Repeated falls: Secondary | ICD-10-CM | POA: Diagnosis not present

## 2020-03-14 DIAGNOSIS — Y999 Unspecified external cause status: Secondary | ICD-10-CM | POA: Diagnosis not present

## 2020-03-14 DIAGNOSIS — G20 Parkinson's disease: Secondary | ICD-10-CM | POA: Insufficient documentation

## 2020-03-14 DIAGNOSIS — M47812 Spondylosis without myelopathy or radiculopathy, cervical region: Secondary | ICD-10-CM | POA: Diagnosis not present

## 2020-03-14 DIAGNOSIS — M25562 Pain in left knee: Secondary | ICD-10-CM | POA: Diagnosis not present

## 2020-03-14 DIAGNOSIS — F0390 Unspecified dementia without behavioral disturbance: Secondary | ICD-10-CM | POA: Diagnosis not present

## 2020-03-14 DIAGNOSIS — Z96652 Presence of left artificial knee joint: Secondary | ICD-10-CM | POA: Insufficient documentation

## 2020-03-14 DIAGNOSIS — Y9289 Other specified places as the place of occurrence of the external cause: Secondary | ICD-10-CM | POA: Insufficient documentation

## 2020-03-14 DIAGNOSIS — G319 Degenerative disease of nervous system, unspecified: Secondary | ICD-10-CM | POA: Diagnosis not present

## 2020-03-14 DIAGNOSIS — W228XXA Striking against or struck by other objects, initial encounter: Secondary | ICD-10-CM | POA: Diagnosis not present

## 2020-03-14 DIAGNOSIS — Z87891 Personal history of nicotine dependence: Secondary | ICD-10-CM | POA: Insufficient documentation

## 2020-03-14 DIAGNOSIS — R220 Localized swelling, mass and lump, head: Secondary | ICD-10-CM | POA: Diagnosis not present

## 2020-03-14 DIAGNOSIS — S199XXA Unspecified injury of neck, initial encounter: Secondary | ICD-10-CM | POA: Diagnosis not present

## 2020-03-14 DIAGNOSIS — E049 Nontoxic goiter, unspecified: Secondary | ICD-10-CM | POA: Diagnosis not present

## 2020-03-14 LAB — BASIC METABOLIC PANEL
Anion gap: 10 (ref 5–15)
BUN: 24 mg/dL — ABNORMAL HIGH (ref 8–23)
CO2: 27 mmol/L (ref 22–32)
Calcium: 8.5 mg/dL — ABNORMAL LOW (ref 8.9–10.3)
Chloride: 107 mmol/L (ref 98–111)
Creatinine, Ser: 0.8 mg/dL (ref 0.44–1.00)
GFR calc Af Amer: >60 mL/min (ref >60)
GFR calc non Af Amer: >60 mL/min (ref >60)
Glucose, Bld: 115 mg/dL — ABNORMAL HIGH (ref 70–99)
Potassium: 3.6 mmol/L (ref 3.5–5.1)
Sodium: 144 mmol/L (ref 135–145)

## 2020-03-14 LAB — CBC
HCT: 38.5 % (ref 36.0–46.0)
Hemoglobin: 12.4 g/dL (ref 12.0–15.0)
MCH: 28.6 pg (ref 26.0–34.0)
MCHC: 32.2 g/dL (ref 30.0–36.0)
MCV: 88.9 fL (ref 80.0–100.0)
Platelets: 210 10*3/uL (ref 150–400)
RBC: 4.33 MIL/uL (ref 3.87–5.11)
RDW: 13.7 % (ref 11.5–15.5)
WBC: 9.9 10*3/uL (ref 4.0–10.5)
nRBC: 0 % (ref 0.0–0.2)

## 2020-03-14 NOTE — ED Triage Notes (Signed)
Pt with husband who is pts caretaker. Husband reports pt falls "a lot" due to her parkinson's and tonight she fell with walker and her head went through sheet rock wall. Posterior head has swelling noted. Husband reports pt has been lethargic and had nausea. Pt able to answer questions appropriately.

## 2020-03-15 ENCOUNTER — Emergency Department
Admission: EM | Admit: 2020-03-15 | Discharge: 2020-03-15 | Disposition: A | Discharge status: Home / Self Care | Payer: Medicare Other | Attending: Emergency Medicine | Admitting: Emergency Medicine

## 2020-03-15 ENCOUNTER — Other Ambulatory Visit: Payer: Self-pay

## 2020-03-15 DIAGNOSIS — M1990 Unspecified osteoarthritis, unspecified site: Secondary | ICD-10-CM | POA: Diagnosis not present

## 2020-03-15 DIAGNOSIS — G8929 Other chronic pain: Secondary | ICD-10-CM | POA: Diagnosis not present

## 2020-03-15 DIAGNOSIS — W19XXXA Unspecified fall, initial encounter: Secondary | ICD-10-CM

## 2020-03-15 DIAGNOSIS — L89312 Pressure ulcer of right buttock, stage 2: Secondary | ICD-10-CM | POA: Diagnosis not present

## 2020-03-15 DIAGNOSIS — G629 Polyneuropathy, unspecified: Secondary | ICD-10-CM | POA: Diagnosis not present

## 2020-03-15 DIAGNOSIS — S0990XA Unspecified injury of head, initial encounter: Secondary | ICD-10-CM

## 2020-03-15 DIAGNOSIS — G20 Parkinson's disease: Secondary | ICD-10-CM | POA: Diagnosis not present

## 2020-03-15 DIAGNOSIS — F0281 Dementia in other diseases classified elsewhere with behavioral disturbance: Secondary | ICD-10-CM | POA: Diagnosis not present

## 2020-03-15 LAB — URINALYSIS, COMPLETE (UACMP) WITH MICROSCOPIC
Bacteria, UA: NONE SEEN
Bilirubin Urine: NEGATIVE
Glucose, UA: NEGATIVE mg/dL
Hgb urine dipstick: NEGATIVE
Ketones, ur: 5 mg/dL — AB
Nitrite: NEGATIVE
Protein, ur: NEGATIVE mg/dL
Specific Gravity, Urine: 1.028 (ref 1.005–1.030)
pH: 5 (ref 5.0–8.0)

## 2020-03-15 LAB — TROPONIN I (HIGH SENSITIVITY): Troponin I (High Sensitivity): 4 ng/L (ref <18)

## 2020-03-15 NOTE — ED Provider Notes (Signed)
[facility] Emergency Department Provider Note   ____________________________________________    I have reviewed the triage vital signs and the nursing notes.   HISTORY  Chief Complaint Fall  With history of dementia and Parkinson's disease   HPI Sarah Donaldson is a 69 y.o. adult with a history of Parkinson's disease who presents after a fall. Husband reports patient lost her balance and fell hitting her head against the wall, she did break the drywall. She is not on blood thinners. Felt dizzy and mildly nauseated after the injury however now reports she feels fine and wants to go home. No neuro deficits. No abdominal pain, no chest pain, no other injuries reported  Past Medical History:  Diagnosis Date  . Cancer ([place])    Non-hodgkin's lymphoma (in remission)  . GERD (gastroesophageal reflux disease)    well-controlled w/ omeprazole  . Hyperlipidemia   . Knee joint cyst, left 02/07/2018  . Parkinson disease ([place])   . Parkinson disease [facility])     Patient Active Problem List   Diagnosis Date Noted  . Altered mental status   . Dementia due to Parkinson's disease with behavioral disturbance ([place])   . SDH (subdural hematoma) ([place]) 12/11/2019  . Falls frequently 12/11/2019  . S/P total knee replacement, left 06/07/2019  . Osteoarthritis of knee 04/27/2019  . Panic attacks 04/07/2019  . Psychosis ([place]) 04/04/2019  . Numbness 03/09/2018  . Lumbar spondylosis 03/07/2018  . Chronic pain of left knee 03/07/2018  . Chronic upper back pain 12/08/2017  . Benign neoplasm of hand 10/20/2017  . Chronic pain syndrome 10/20/2017  . Chronic pain of right lower extremity 10/20/2017  . Chronic myofascial pain 10/20/2017  . Numbness and tingling 07/12/2017  . Sleep difficulties 07/12/2017  . Low back pain 01/18/2017  . Sleep behavior disorder, REM 01/18/2017  . Lumbar radiculopathy 11/01/2016  . Follicular lymphoma of extranodal and solid organ sites  [facility]) 05/01/2016  . Lymphoma ([place]) 04/30/2016  . Hallux limitus 07/27/2015  . Low back pain with sciatica 02/13/2015  . Bilateral low back pain with sciatica 02/13/2015  . H/O adenomatous polyp of colon 04/06/2014  . Hx of adenomatous colonic polyps 04/06/2014  . Back ache 01/25/2014  . Adiposity 01/25/2014  . REM sleep behavior disorder 01/25/2014  . Disordered sleep 01/25/2014  . Has a tremor 01/25/2014  . Obesity, unspecified 01/25/2014  . Sleep disorder 01/25/2014  . Backache 01/25/2014  . Tremor 01/25/2014  . Obesity 01/25/2014  . Difficulty hearing 06/08/2012  . Hearing loss 06/08/2012  . Anxiety disorder 06/06/2012  . Colon polyp 06/06/2012  . Clinical depression 06/06/2012  . Hypercholesteremia 06/06/2012  . Idiopathic Parkinson's disease ([place]) 06/06/2012  . Detached retina 06/06/2012  . Depressive disorder 06/06/2012  . Retinal detachment 06/06/2012  . Parkinson's disease ([place]) 06/06/2012    Past Surgical History:  Procedure Laterality Date  . BACK SURGERY    . BUNIONECTOMY    . NASAL SEPTUM SURGERY    . RETINAL DETACHMENT SURGERY    . TOTAL KNEE ARTHROPLASTY Left 06/07/2019   Procedure: TOTAL KNEE ARTHROPLASTY;  Surgeon: Lovell Sheehan, MD;  Location: [facility] ORS;  Service: Orthopedics;  Laterality: Left;    Prior to Admission medications   Medication Sig Start Date End Date Taking? Authorizing Provider  acetaminophen (TYLENOL) 500 MG tablet Take 500 mg by mouth 2 (two) times daily.     [provider]  carbidopa-levodopa (SINEMET IR) 25-250 MG tablet Take 2 tablets by mouth See  admin instructions. Take 2 and 1/2 tablets every 2 hours eight times daily. 12/18/19   [provider]  clonazePAM (KLONOPIN) 2 MG tablet Take 1 mg by mouth in the morning, at noon, in the evening, and at bedtime.  02/16/20   [provider]  diclofenac (VOLTAREN) 50 MG EC tablet Take 75 mg by mouth 2 (two) times daily.    [provider]  donepezil  (ARICEPT) 5 MG tablet Take 1 tablet (5 mg total) by mouth at bedtime. 12/13/19   Ezekiel Slocumb, DO  entacapone (COMTAN) 200 MG tablet Take 200 mg by mouth 6 (six) times daily. With carbidopa    [provider]  escitalopram (LEXAPRO) 10 MG tablet Take 10 mg by mouth daily.    [provider]  furosemide (LASIX) 40 MG tablet Take 40 mg by mouth daily.     [provider]  melatonin 5 MG TABS Take 1 tablet (5 mg total) by mouth at bedtime. 12/13/19   Ezekiel Slocumb, DO  neomycin-polymyxin-dexamethasone (MAXITROL) 0.1 % ophthalmic suspension Place 1 drop into both eyes 4 (four) times daily.    [provider]  nitrofurantoin (MACRODANTIN) 50 MG capsule Take 1 capsule (50 mg total) by mouth at bedtime. 01/05/20   Cletis Athens, MD  omeprazole (PRILOSEC) 20 MG capsule Take 1 capsule (20 mg total) by mouth daily. 03/07/18 11/22/24  Vevelyn Francois, NP  Pimavanserin Tartrate (NUPLAZID) 34 MG CAPS Take 34 mg by mouth daily.    [provider]  prednisoLONE acetate (PRED FORTE) 1 % ophthalmic suspension Place 1 drop into the left eye 4 (four) times daily.    [provider]  pregabalin (LYRICA) 100 MG capsule Take 100 mg by mouth 3 (three) times daily. 11/18/19   [provider]  QUEtiapine (SEROQUEL) 50 MG tablet Take 1 tablet (50 mg total) by mouth at bedtime. Patient taking differently: Take 25-75 mg by mouth 2 (two) times daily. 3 tablets (75 mg) daily and 2 tablets (50 mg) at bedtime 04/04/19   Ursula Alert, MD  tiZANidine (ZANAFLEX) 4 MG tablet Take 4 mg by mouth every 8 (eight) hours as needed.  10/21/19   [provider]  traZODone (DESYREL) 50 MG tablet Take 25-50 mg by mouth at bedtime.  10/04/18   [provider]     Allergies Percocet [oxycodone-acetaminophen], Baclofen, Codeine sulfate, Gabapentin, Terazosin, and Tramadol hcl  Family History  Problem Relation Age of Onset  . Heart disease Mother   . Alcohol  abuse Father   . Breast cancer Neg Hx   . Mental illness Neg Hx     Social History Social History   Tobacco Use  . Smoking status: Former Research scientist (life sciences)  . Smokeless tobacco: Never Used  Vaping Use  . Vaping Use: Never used  Substance Use Topics  . Alcohol use: Yes    Alcohol/week: 0.0 standard drinks  . Drug use: No    Review of Systems  Constitutional: No fever/chills Eyes: No visual changes.  ENT: No nasal injury Cardiovascular: Denies chest wall pain Respiratory: Denies shortness of breath. Gastrointestinal: No abdominal pain.   Genitourinary: Negative for dysuria. Musculoskeletal: Negative for back pain. Skin: Negative for rash. Neurological: As above   ____________________________________________   PHYSICAL EXAM:  VITAL SIGNS: ED Triage Vitals  Enc Vitals Group     BP 03/14/20 2254 128/78     Pulse Rate 03/14/20 2254 93     Resp 03/14/20 2254 18  Temp 03/14/20 2254 98.2 F (36.8 C)     Temp Source 03/14/20 2254 Oral     SpO2 03/14/20 2254 97 %     Weight --      Height --      Head Circumference --      Peak Flow --      Pain Score 03/15/20 0908 0     Pain Loc --      Pain Edu? --      Excl. in [place]? --     Constitutional: Alert . No acute distress.  Eyes: Conjunctivae are normal.  Head: Mild hematoma posterior scalp Nose: No congestion/rhinnorhea. Mouth/Throat: Mucous membranes are moist.   Neck:  Painless ROM, no vertebral nodes palpation, no pain with axial load Cardiovascular: Normal rate, regular rhythm. Grossly normal heart sounds.  Good peripheral circulation. No chest wall tenderness palpation Respiratory: Normal respiratory effort.  No retractions.  Gastrointestinal: Soft and nontender. No distention.    Musculoskeletal: Normal range of motion of all extremities, no pain with axial load on both hips Neurologic:  Normal speech and language. No gross focal neurologic deficits are appreciated.  Skin:  Skin is warm, dry and intact. No rash  noted. Psychiatric: Mood and affect are normal. Speech and behavior are normal.  ____________________________________________   LABS (all labs ordered are listed, but only abnormal results are displayed)  Labs Reviewed  BASIC METABOLIC PANEL - Abnormal; Notable for the following components:      Result Value   Glucose, Bld 115 (*)    BUN 24 (*)    Calcium 8.5 (*)    All other components within normal limits  URINALYSIS, COMPLETE (UACMP) WITH MICROSCOPIC - Abnormal; Notable for the following components:   Color, Urine AMBER (*)    APPearance CLEAR (*)    Ketones, ur 5 (*)    Leukocytes,Ua TRACE (*)    All other components within normal limits  CBC  TROPONIN I (HIGH SENSITIVITY)   ____________________________________________  EKG  ED ECG REPORT I, Lavonia Drafts, the attending physician, personally viewed and interpreted this ECG.  Date: 03/15/2020  Rhythm: normal sinus rhythm QRS Axis: normal Intervals: normal ST/T Wave abnormalities: normal Narrative Interpretation: no evidence of acute ischemia  ____________________________________________  RADIOLOGY  CT head and cervical spine without acute abnormality ____________________________________________   PROCEDURES  Procedure(s) performed: No  Procedures   Critical Care performed: No ____________________________________________   INITIAL IMPRESSION / ASSESSMENT AND PLAN / ED COURSE  Pertinent labs & imaging results that were available during my care of the patient were reviewed by me and considered in my medical decision making (see chart for details).  Patient presents after a fall, mechanical with head injury. Is feeling well now, no neuro deficits, no concerning symptoms at this time. Imaging is reassuring.  Lab work today demonstrates mild elevation in BUN consistent with mild dehydration otherwise reassuring.  Appropriate for discharge with outpatient follow-up as needed      ____________________________________________   FINAL CLINICAL IMPRESSION(S) / ED DIAGNOSES  Final diagnoses:  Fall, initial encounter  Injury of head, initial encounter        Note:  This document was prepared using Dragon voice recognition software and may include unintentional dictation errors.   Lavonia Drafts, MD 03/15/20 [id number]

## 2020-03-19 DIAGNOSIS — M1990 Unspecified osteoarthritis, unspecified site: Secondary | ICD-10-CM | POA: Diagnosis not present

## 2020-03-19 DIAGNOSIS — F0281 Dementia in other diseases classified elsewhere with behavioral disturbance: Secondary | ICD-10-CM | POA: Diagnosis not present

## 2020-03-19 DIAGNOSIS — G20 Parkinson's disease: Secondary | ICD-10-CM | POA: Diagnosis not present

## 2020-03-19 DIAGNOSIS — G8929 Other chronic pain: Secondary | ICD-10-CM | POA: Diagnosis not present

## 2020-03-19 DIAGNOSIS — G629 Polyneuropathy, unspecified: Secondary | ICD-10-CM | POA: Diagnosis not present

## 2020-03-19 DIAGNOSIS — L89312 Pressure ulcer of right buttock, stage 2: Secondary | ICD-10-CM | POA: Diagnosis not present

## 2020-03-21 DIAGNOSIS — F0281 Dementia in other diseases classified elsewhere with behavioral disturbance: Secondary | ICD-10-CM | POA: Diagnosis not present

## 2020-03-21 DIAGNOSIS — G629 Polyneuropathy, unspecified: Secondary | ICD-10-CM | POA: Diagnosis not present

## 2020-03-21 DIAGNOSIS — G20 Parkinson's disease: Secondary | ICD-10-CM | POA: Diagnosis not present

## 2020-03-21 DIAGNOSIS — L89312 Pressure ulcer of right buttock, stage 2: Secondary | ICD-10-CM | POA: Diagnosis not present

## 2020-03-21 DIAGNOSIS — G8929 Other chronic pain: Secondary | ICD-10-CM | POA: Diagnosis not present

## 2020-03-21 DIAGNOSIS — M1990 Unspecified osteoarthritis, unspecified site: Secondary | ICD-10-CM | POA: Diagnosis not present

## 2020-03-25 DIAGNOSIS — L89312 Pressure ulcer of right buttock, stage 2: Secondary | ICD-10-CM | POA: Diagnosis not present

## 2020-03-25 DIAGNOSIS — G20 Parkinson's disease: Secondary | ICD-10-CM | POA: Diagnosis not present

## 2020-03-25 DIAGNOSIS — G629 Polyneuropathy, unspecified: Secondary | ICD-10-CM | POA: Diagnosis not present

## 2020-03-25 DIAGNOSIS — F0281 Dementia in other diseases classified elsewhere with behavioral disturbance: Secondary | ICD-10-CM | POA: Diagnosis not present

## 2020-03-25 DIAGNOSIS — M1990 Unspecified osteoarthritis, unspecified site: Secondary | ICD-10-CM | POA: Diagnosis not present

## 2020-03-25 DIAGNOSIS — G8929 Other chronic pain: Secondary | ICD-10-CM | POA: Diagnosis not present

## 2020-03-26 DIAGNOSIS — G8929 Other chronic pain: Secondary | ICD-10-CM | POA: Diagnosis not present

## 2020-03-26 DIAGNOSIS — G20 Parkinson's disease: Secondary | ICD-10-CM | POA: Diagnosis not present

## 2020-03-26 DIAGNOSIS — G629 Polyneuropathy, unspecified: Secondary | ICD-10-CM | POA: Diagnosis not present

## 2020-03-26 DIAGNOSIS — F0281 Dementia in other diseases classified elsewhere with behavioral disturbance: Secondary | ICD-10-CM | POA: Diagnosis not present

## 2020-03-26 DIAGNOSIS — L89312 Pressure ulcer of right buttock, stage 2: Secondary | ICD-10-CM | POA: Diagnosis not present

## 2020-03-26 DIAGNOSIS — M1990 Unspecified osteoarthritis, unspecified site: Secondary | ICD-10-CM | POA: Diagnosis not present

## 2020-03-27 ENCOUNTER — Encounter: Payer: Self-pay | Admitting: Emergency Medicine

## 2020-03-27 ENCOUNTER — Emergency Department: Payer: Medicare Other

## 2020-03-27 ENCOUNTER — Other Ambulatory Visit: Payer: Self-pay

## 2020-03-27 DIAGNOSIS — F329 Major depressive disorder, single episode, unspecified: Secondary | ICD-10-CM | POA: Diagnosis not present

## 2020-03-27 DIAGNOSIS — F29 Unspecified psychosis not due to a substance or known physiological condition: Secondary | ICD-10-CM | POA: Diagnosis not present

## 2020-03-27 DIAGNOSIS — F419 Anxiety disorder, unspecified: Secondary | ICD-10-CM | POA: Insufficient documentation

## 2020-03-27 DIAGNOSIS — G3183 Dementia with Lewy bodies: Secondary | ICD-10-CM | POA: Insufficient documentation

## 2020-03-27 DIAGNOSIS — C8590 Non-Hodgkin lymphoma, unspecified, unspecified site: Secondary | ICD-10-CM | POA: Diagnosis not present

## 2020-03-27 DIAGNOSIS — G9340 Encephalopathy, unspecified: Secondary | ICD-10-CM | POA: Diagnosis not present

## 2020-03-27 DIAGNOSIS — F4320 Adjustment disorder, unspecified: Secondary | ICD-10-CM | POA: Insufficient documentation

## 2020-03-27 DIAGNOSIS — F0281 Dementia in other diseases classified elsewhere with behavioral disturbance: Secondary | ICD-10-CM | POA: Diagnosis not present

## 2020-03-27 DIAGNOSIS — M1990 Unspecified osteoarthritis, unspecified site: Secondary | ICD-10-CM | POA: Diagnosis not present

## 2020-03-27 DIAGNOSIS — G20 Parkinson's disease: Secondary | ICD-10-CM | POA: Diagnosis not present

## 2020-03-27 DIAGNOSIS — Z87891 Personal history of nicotine dependence: Secondary | ICD-10-CM | POA: Insufficient documentation

## 2020-03-27 DIAGNOSIS — R531 Weakness: Secondary | ICD-10-CM | POA: Diagnosis not present

## 2020-03-27 DIAGNOSIS — L89312 Pressure ulcer of right buttock, stage 2: Secondary | ICD-10-CM | POA: Diagnosis not present

## 2020-03-27 DIAGNOSIS — G8929 Other chronic pain: Secondary | ICD-10-CM | POA: Diagnosis not present

## 2020-03-27 DIAGNOSIS — G629 Polyneuropathy, unspecified: Secondary | ICD-10-CM | POA: Diagnosis not present

## 2020-03-27 DIAGNOSIS — Z96652 Presence of left artificial knee joint: Secondary | ICD-10-CM | POA: Diagnosis not present

## 2020-03-27 DIAGNOSIS — R0902 Hypoxemia: Secondary | ICD-10-CM | POA: Diagnosis not present

## 2020-03-27 LAB — CBC WITH DIFFERENTIAL/PLATELET
Abs Immature Granulocytes: 0.02 10*3/uL (ref 0.00–0.07)
Basophils Absolute: 0 10*3/uL (ref 0.0–0.1)
Basophils Relative: 0 %
Eosinophils Absolute: 0.1 10*3/uL (ref 0.0–0.5)
Eosinophils Relative: 2 %
HCT: 36 % (ref 36.0–46.0)
Hemoglobin: 11.2 g/dL — ABNORMAL LOW (ref 12.0–15.0)
Immature Granulocytes: 0 %
Lymphocytes Relative: 25 %
Lymphs Abs: 1.4 10*3/uL (ref 0.7–4.0)
MCH: 28.1 pg (ref 26.0–34.0)
MCHC: 31.1 g/dL (ref 30.0–36.0)
MCV: 90.2 fL (ref 80.0–100.0)
Monocytes Absolute: 0.5 10*3/uL (ref 0.1–1.0)
Monocytes Relative: 10 %
Neutro Abs: 3.5 10*3/uL (ref 1.7–7.7)
Neutrophils Relative %: 63 %
Platelets: 195 10*3/uL (ref 150–400)
RBC: 3.99 MIL/uL (ref 3.87–5.11)
RDW: 13.4 % (ref 11.5–15.5)
WBC: 5.6 10*3/uL (ref 4.0–10.5)
nRBC: 0 % (ref 0.0–0.2)

## 2020-03-27 NOTE — ED Notes (Signed)
Pt to BR and assisted to stand and pivot; pt able to stand with assistance but when sitting, noted pt with poor upper body control; husband reports that this is not an acute onset but has been occurring since last visit here; pt able to void; assisted into gown and back to recliner with warm blanket for comfort

## 2020-03-27 NOTE — ED Triage Notes (Signed)
Pt to triage via recliner, brought in by EMS from home; pt with hx dementia & parkinson's & is accomp by husb for c/o "anxiety & combativeness" x 3 days; pt with slurred speech, alert to self

## 2020-03-28 ENCOUNTER — Emergency Department
Admission: EM | Admit: 2020-03-28 | Discharge: 2020-03-30 | Disposition: A | Discharge status: Home / Self Care | Payer: Medicare Other | Attending: Emergency Medicine | Admitting: Emergency Medicine

## 2020-03-28 DIAGNOSIS — F0281 Dementia in other diseases classified elsewhere with behavioral disturbance: Secondary | ICD-10-CM

## 2020-03-28 DIAGNOSIS — G3183 Dementia with Lewy bodies: Secondary | ICD-10-CM | POA: Diagnosis not present

## 2020-03-28 DIAGNOSIS — G20 Parkinson's disease: Secondary | ICD-10-CM

## 2020-03-28 DIAGNOSIS — F0280 Dementia in other diseases classified elsewhere without behavioral disturbance: Secondary | ICD-10-CM | POA: Diagnosis present

## 2020-03-28 DIAGNOSIS — G9340 Encephalopathy, unspecified: Secondary | ICD-10-CM | POA: Diagnosis not present

## 2020-03-28 LAB — COMPREHENSIVE METABOLIC PANEL
ALT: <5 U/L (ref 0–44)
AST: 10 U/L — ABNORMAL LOW (ref 15–41)
Albumin: 3.7 g/dL (ref 3.5–5.0)
Alkaline Phosphatase: 104 U/L (ref 38–126)
Anion gap: 10 (ref 5–15)
BUN: 21 mg/dL (ref 8–23)
CO2: 26 mmol/L (ref 22–32)
Calcium: 8.6 mg/dL — ABNORMAL LOW (ref 8.9–10.3)
Chloride: 107 mmol/L (ref 98–111)
Creatinine, Ser: 0.4 mg/dL — ABNORMAL LOW (ref 0.44–1.00)
GFR calc Af Amer: >60 mL/min (ref >60)
GFR calc non Af Amer: >60 mL/min (ref >60)
Glucose, Bld: 104 mg/dL — ABNORMAL HIGH (ref 70–99)
Potassium: 3.4 mmol/L — ABNORMAL LOW (ref 3.5–5.1)
Sodium: 143 mmol/L (ref 135–145)
Total Bilirubin: 0.9 mg/dL (ref 0.3–1.2)
Total Protein: 5.9 g/dL — ABNORMAL LOW (ref 6.5–8.1)

## 2020-03-28 LAB — URINALYSIS, COMPLETE (UACMP) WITH MICROSCOPIC
Bacteria, UA: NONE SEEN
Bilirubin Urine: NEGATIVE
Glucose, UA: NEGATIVE mg/dL
Hgb urine dipstick: NEGATIVE
Ketones, ur: 20 mg/dL — AB
Nitrite: NEGATIVE
Protein, ur: 30 mg/dL — AB
Specific Gravity, Urine: 1.033 — ABNORMAL HIGH (ref 1.005–1.030)
WBC, UA: NONE SEEN WBC/hpf (ref 0–5)
pH: 5 (ref 5.0–8.0)

## 2020-03-28 LAB — TROPONIN I (HIGH SENSITIVITY): Troponin I (High Sensitivity): <2 ng/L (ref <18)

## 2020-03-28 MED ORDER — ENTACAPONE 200 MG PO TABS
200.0000 mg | ORAL_TABLET | Freq: Four times a day (QID) | ORAL | Status: DC
  Administered 2020-03-28 – 2020-03-30 (×6): 200 mg via ORAL
  Filled 2020-03-28 (×10): qty 1

## 2020-03-28 MED ORDER — QUETIAPINE FUMARATE 25 MG PO TABS
50.0000 mg | ORAL_TABLET | Freq: Three times a day (TID) | ORAL | Status: DC
  Administered 2020-03-29 – 2020-03-30 (×4): 50 mg via ORAL
  Filled 2020-03-28 (×5): qty 2

## 2020-03-28 MED ORDER — ESCITALOPRAM OXALATE 10 MG PO TABS
10.0000 mg | ORAL_TABLET | Freq: Every day | ORAL | Status: DC
  Administered 2020-03-29 – 2020-03-30 (×2): 10 mg via ORAL
  Filled 2020-03-28 (×3): qty 1

## 2020-03-28 MED ORDER — CARBIDOPA-LEVODOPA 25-100 MG PO TABS
1.0000 | ORAL_TABLET | ORAL | Status: DC
  Administered 2020-03-28 – 2020-03-30 (×14): 1 via ORAL
  Filled 2020-03-28 (×21): qty 1

## 2020-03-28 MED ORDER — PANTOPRAZOLE SODIUM 20 MG PO TBEC
20.0000 mg | DELAYED_RELEASE_TABLET | Freq: Every day | ORAL | Status: DC
  Administered 2020-03-29 – 2020-03-30 (×2): 20 mg via ORAL
  Filled 2020-03-28 (×2): qty 1

## 2020-03-28 MED ORDER — TRAZODONE HCL 50 MG PO TABS
50.0000 mg | ORAL_TABLET | Freq: Every day | ORAL | Status: DC
  Administered 2020-03-28 – 2020-03-29 (×2): 50 mg via ORAL
  Filled 2020-03-28 (×2): qty 1

## 2020-03-28 MED ORDER — PREGABALIN 50 MG PO CAPS
100.0000 mg | ORAL_CAPSULE | Freq: Three times a day (TID) | ORAL | Status: DC
  Administered 2020-03-29 – 2020-03-30 (×5): 100 mg via ORAL
  Filled 2020-03-28 (×6): qty 2

## 2020-03-28 MED ORDER — QUETIAPINE FUMARATE 25 MG PO TABS
100.0000 mg | ORAL_TABLET | Freq: Every day | ORAL | Status: DC
  Administered 2020-03-28 – 2020-03-29 (×2): 100 mg via ORAL
  Filled 2020-03-28 (×2): qty 4

## 2020-03-28 NOTE — BH Assessment (Signed)
Assessment Note  Sarah Donaldson is an 69 y.o. adult who presents to the ER via her husband due to her current behaviors in the home. Per the report of the patient, her husband is overbearing and at frustrates her at times. She also reports, when she is in the middle of doing something, she doesnt like to stop and get upset with the husband because he wants her to do things. Patient admits its things she needs to do, such as take her medications, eat food and or go to bed. Per the husband [name]) the past three days, the patient has been increasingly agitated and non-complaint. It takes him several hours to get her to do things. Shes going to bed at later times but continues to get her proper sleep, which is approximately twelve hours. He also shared, he has had to contact the patients sisters to speak with her to help calm her down, which is a new behavior for her. Husband further shared the patient was recently told by Physical Therapy, she can no longer use her walker but will need to use the wheelchair. However, the patient continues to walk and uses the wheelchair as a walker. She was falling approximately twenty times a day. One of the previous falls resulted in her hitting her head and having a brain bleed, which is why she is to no longer use the walker. Physical therapy made the recommendation last Thursday (03/21/2020). Since then the patients behaviors have worsened. Husband reports, its usually around 5:00pm when she starts to get agitated. This around the time she starts to fully wake up. Now that she goes to bed at later times, she get up latter. She gets her medications and take several naps, around 5pm is when shes fully up and alert. This is the times when shes busy doing thing and when shes preoccupied with them, she doesnt like to stop and thats when the patient become irritable and frustrated with the husband. He have her to take medications and or other things, such as  eating.  While speaking with the patient and her husband, it was shared each time a medical provider restricts what she can do, because of the Parkinson, she has a difficult time adjusting. It results in her being irritable and easily agitated. With this change, the patient admits, this is the worse shes been and she doesnt like it. I can do things for myself. He (husband) always telling me to do this and that. Ill get to when I do Patient reports, her husband isnt abusive but shes annoyed with him because he tells her to do things. She also shared, she knows he does it because he cares but she still doesnt like it. They also discussed how the husband is trying to make sure the patient remains in the home and receives care and not go to a facility. He recently started working with an attorney through eldercare to ensure paperwork is in place to ensure she stay in the home and receive care, in the event something happens to him. Husband stated, the patient has told him several times, he was trying to put her in a (nursing) home but he denies it. He stated, I even had the lawyer to talk with her about it but she still doesnt believe it. He believes this has something to do with her current behaviors as well.  During the interview, the patient was calm, cooperative and pleasant. She was able to provide appropriate answers to the  questions. Throughout the interview, she denied SI/HI and AV/H.   When asked what they hope to accomplished with this ER visit, the patient stated nothing and she was ready to leave. The husband shared, he was concerned about the increase agitation over the last three days, because shes never been like this before.  Diagnosis: Adjustment Disorder  Past Medical History:  Past Medical History:  Diagnosis Date   Cancer ([place])    Non-hodgkin's lymphoma (in remission)   GERD (gastroesophageal reflux disease)    well-controlled w/ omeprazole   Hyperlipidemia     Knee joint cyst, left 02/07/2018   Parkinson disease ([place])    Parkinson disease ([place])     Past Surgical History:  Procedure Laterality Date   BACK SURGERY     BUNIONECTOMY     NASAL SEPTUM SURGERY     RETINAL DETACHMENT SURGERY     TOTAL KNEE ARTHROPLASTY Left 06/07/2019   Procedure: TOTAL KNEE ARTHROPLASTY;  Surgeon: Lovell Sheehan, MD;  Location: [facility] ORS;  Service: Orthopedics;  Laterality: Left;    Family History:  Family History  Problem Relation Age of Onset   Heart disease Mother    Alcohol abuse Father    Breast cancer Neg Hx    Mental illness Neg Hx     Social History:  reports that she has quit smoking. She has never used smokeless tobacco. She reports current alcohol use. She reports that she does not use drugs.  Additional Social History:  Alcohol / Drug Use Pain Medications: See PTA Prescriptions: See PTA Over the Counter: See PTA History of alcohol / drug use?: No history of alcohol / drug abuse Longest period of sobriety (when/how long): n/a  CIWA: CIWA-Ar BP: 101/66 Pulse Rate: 75 COWS:    Allergies:  Allergies  Allergen Reactions   Percocet [Oxycodone-Acetaminophen]     Hallucination   Baclofen     Caused numbness in mouth    Codeine Sulfate Nausea And Vomiting   Gabapentin Swelling    Mouth swelling    Terazosin    Tramadol Hcl     Interferes with Serotonin levels which affects her Parkinsons    Home Medications: (Not in a hospital admission)   OB/GYN Status:  No LMP recorded. Patient is postmenopausal.  General Assessment Data Location of Assessment: [facility] ED TTS Assessment: In system Is this a Tele or Face-to-Face Assessment?: Face-to-Face Is this an Initial Assessment or a Re-assessment for this encounter?: Initial Assessment Patient Accompanied by:: Other (Husband) Permission Given to speak with another: Yes Name, Relationship and Phone Number: Husband-He is in the room Language Other than English: No Living  Arrangements:  (Private Home) What gender do you identify as?: Female Date Telepsych consult ordered in [facility]: 03/28/20 Time Telepsych consult ordered in [facility]: 1611 Marital status: Married Pregnancy Status: No Living Arrangements: Spouse/significant other Can pt return to current living arrangement?: Yes Admission Status: Voluntary Is patient capable of signing voluntary admission?: Yes Referral Source: Self/Family/Friend Insurance type: Medicare A&B  Medical Screening Exam [facility] Walk-in ONLY) Medical Exam completed: Yes  Crisis Care Plan Living Arrangements: Spouse/significant other Legal Guardian: Other: (Self) Name of Psychiatrist: Reports of none Name of Therapist: Reports of none  Education Status Is patient currently in school?: No Is the patient employed, unemployed or receiving disability?: Unemployed, Receiving disability income  Risk to self with the past 6 months Suicidal Ideation: No Has patient been a risk to self within the past 6 months prior to admission? : No Suicidal Intent:  No Has patient had any suicidal intent within the past 6 months prior to admission? : No Is patient at risk for suicide?: No Suicidal Plan?: No Has patient had any suicidal plan within the past 6 months prior to admission? : No Access to Means: No What has been your use of drugs/alcohol within the last 12 months?: Reports of none Previous Attempts/Gestures: No Other Self Harm Risks: Reports of none Triggers for Past Attempts: None known Intentional Self Injurious Behavior: None Family Suicide History: No Recent stressful life event(s): Other (Comment), Recent negative physical changes Persecutory voices/beliefs?: No Depression: Yes Depression Symptoms: Feeling angry/irritable Substance abuse history and/or treatment for substance abuse?: No Suicide prevention information given to non-admitted patients: Not applicable  Risk to Others within the past 6 months Homicidal Ideation:  No Does patient have any lifetime risk of violence toward others beyond the six months prior to admission? : No Thoughts of Harm to Others: No Current Homicidal Intent: No Current Homicidal Plan: No Access to Homicidal Means: No Identified Victim: Reports of none History of harm to others?: No Assessment of Violence: None Noted Violent Behavior Description: Reports of none Does patient have access to weapons?: No Criminal Charges Pending?: No Does patient have a court date: No Is patient on probation?: No  Psychosis Hallucinations: None noted Delusions: None noted  Mental Status Report Appearance/Hygiene: Unremarkable, In scrubs Eye Contact: Fair Motor Activity: Unable to assess Speech: Pressured, Soft Level of Consciousness: Alert Mood: Pleasant Affect: Appropriate to circumstance Anxiety Level: None Thought Processes: Coherent, Relevant Judgement: Unimpaired Orientation: Person, Place, Time, Situation, Appropriate for developmental age Obsessive Compulsive Thoughts/Behaviors: None  Cognitive Functioning Concentration: Normal Memory: Recent Intact, Remote Intact Is patient IDD: No Insight: Fair Impulse Control: Fair Appetite: Poor Have you had any weight changes? : No Change Sleep: No Change Total Hours of Sleep: 12 Vegetative Symptoms: None  ADLScreening [facility]) Patient's cognitive ability adequate to safely complete daily activities?: Yes Patient able to express need for assistance with ADLs?: Yes Independently performs ADLs?: No  Prior Inpatient Therapy Prior Inpatient Therapy: Yes Prior Therapy Dates: 02/2020 Prior Therapy Facilty/Provider(s): [facility] Reason for Treatment: Dementia due to Parkinson's disease w/behavioral disturbance  Prior Outpatient Therapy Prior Outpatient Therapy: No Does patient have an ACCT team?: No Does patient have Intensive In-House Services?  : No Does patient have [facility] services? :  No Does patient have P4CC services?: No  ADL Screening (condition at time of admission) Patient's cognitive ability adequate to safely complete daily activities?: Yes Is the patient deaf or have difficulty hearing?: No Does the patient have difficulty seeing, even when wearing glasses/contacts?: No Does the patient have difficulty concentrating, remembering, or making decisions?: No Patient able to express need for assistance with ADLs?: Yes Does the patient have difficulty dressing or bathing?: Yes Independently performs ADLs?: No Does the patient have difficulty walking or climbing stairs?: Yes Weakness of Legs: Both Weakness of Arms/Hands: Both     Therapy Consults (therapy consults require a physician order) PT Evaluation Needed: No OT Evalulation Needed: No SLP Evaluation Needed: No Abuse/Neglect Assessment (Assessment to be complete while patient is alone) Abuse/Neglect Assessment Can Be Completed: Yes Physical Abuse: Denies Verbal Abuse: Denies Sexual Abuse: Denies Exploitation of patient/patient's resources: Denies Self-Neglect: Denies Values / Beliefs Cultural Requests During Hospitalization: None Spiritual Requests During Hospitalization: None Consults Spiritual Care Consult Needed: No Transition of Care Team Consult Needed: No Advance Directives (For Healthcare) Does Patient Have a Medical Advance Directive?: Yes  Does patient want to make changes to medical advance directive?: No - Patient declined Type of Advance Directive: Healthcare Power of Attorney, Living will [place] in Chart?: No - copy requested Copy of Living Will in Chart?: No - copy requested Would patient like information on creating a medical advance directive?: No - Patient declined  Disposition:  Disposition Initial Assessment Completed for this Encounter: Yes  On Site Evaluation by:   Reviewed with Physician:    Gunnar Fusi MS, LCAS, [facility],  [place] Therapeutic Triage Specialist 03/28/2020 7:20 PM

## 2020-03-28 NOTE — ED Notes (Signed)
Pharmacy to send up medications and verify

## 2020-03-28 NOTE — ED Notes (Signed)
Medications given by by husband at 4pm next mediations due at 6pm. pharmacy updated and informed to review medications and update correct times.

## 2020-03-28 NOTE — ED Notes (Signed)
Pt up to toilet 

## 2020-03-28 NOTE — ED Notes (Signed)
Pt assisted to toilet 

## 2020-03-28 NOTE — ED Notes (Signed)
Family at bedside. 

## 2020-03-28 NOTE — ED Provider Notes (Signed)
[facility] Emergency Department Provider Note ____________________________________________   First MD Initiated Contact with Patient 03/28/20 1524     (approximate)  I have reviewed the triage vital signs and the nursing notes.   HISTORY  Chief Complaint Weakness  Level 5 caveat: History present illness limited due to dementia  HPI Sarah Donaldson is a 69 y.o. adult with PMH as noted below including history of Parkinson's disease and dementia who presents with 2 main complaints.  Her husband is the primary historian.  He reports that the patient has had increased generalized weakness and more frequent falls over the last several weeks, including multiple falls within the last few days.  The patient herself denies any specific pain.  The other primary complaint is behavioral; the husband reports significantly increased agitation, aggressive behavior, and hallucinations over the last several weeks.  The patient's neurologist Dr. Melrose Nakayama has adjusted her medications, and the husband reports that she has been sleeping well after taking Seroquel, but is frequently hallucinating and aggressive during the day.   Past Medical History:  Diagnosis Date  . Cancer ([place])    Non-hodgkin's lymphoma (in remission)  . GERD (gastroesophageal reflux disease)    well-controlled w/ omeprazole  . Hyperlipidemia   . Knee joint cyst, left 02/07/2018  . Parkinson disease ([place])   . Parkinson disease [facility])     Patient Active Problem List   Diagnosis Date Noted  . Altered mental status   . Dementia due to Parkinson's disease with behavioral disturbance ([place])   . SDH (subdural hematoma) ([place]) 12/11/2019  . Falls frequently 12/11/2019  . S/P total knee replacement, left 06/07/2019  . Osteoarthritis of knee 04/27/2019  . Panic attacks 04/07/2019  . Psychosis ([place]) 04/04/2019  . Numbness 03/09/2018  . Lumbar spondylosis 03/07/2018  . Chronic pain of left knee 03/07/2018  .  Chronic upper back pain 12/08/2017  . Benign neoplasm of hand 10/20/2017  . Chronic pain syndrome 10/20/2017  . Chronic pain of right lower extremity 10/20/2017  . Chronic myofascial pain 10/20/2017  . Numbness and tingling 07/12/2017  . Sleep difficulties 07/12/2017  . Low back pain 01/18/2017  . Sleep behavior disorder, REM 01/18/2017  . Lumbar radiculopathy 11/01/2016  . Follicular lymphoma of extranodal and solid organ sites [facility]) 05/01/2016  . Lymphoma ([place]) 04/30/2016  . Hallux limitus 07/27/2015  . Low back pain with sciatica 02/13/2015  . Bilateral low back pain with sciatica 02/13/2015  . H/O adenomatous polyp of colon 04/06/2014  . Hx of adenomatous colonic polyps 04/06/2014  . Back ache 01/25/2014  . Adiposity 01/25/2014  . REM sleep behavior disorder 01/25/2014  . Disordered sleep 01/25/2014  . Has a tremor 01/25/2014  . Obesity, unspecified 01/25/2014  . Sleep disorder 01/25/2014  . Backache 01/25/2014  . Tremor 01/25/2014  . Obesity 01/25/2014  . Difficulty hearing 06/08/2012  . Hearing loss 06/08/2012  . Anxiety disorder 06/06/2012  . Colon polyp 06/06/2012  . Clinical depression 06/06/2012  . Hypercholesteremia 06/06/2012  . Idiopathic Parkinson's disease ([place]) 06/06/2012  . Detached retina 06/06/2012  . Depressive disorder 06/06/2012  . Retinal detachment 06/06/2012  . Parkinson's disease ([place]) 06/06/2012    Past Surgical History:  Procedure Laterality Date  . BACK SURGERY    . BUNIONECTOMY    . NASAL SEPTUM SURGERY    . RETINAL DETACHMENT SURGERY    . TOTAL KNEE ARTHROPLASTY Left 06/07/2019   Procedure: TOTAL KNEE ARTHROPLASTY;  Surgeon: Lovell Sheehan, MD;  Location: [facility]  ORS;  Service: Orthopedics;  Laterality: Left;    Prior to Admission medications   Medication Sig Start Date End Date Taking? Authorizing Provider  acetaminophen (TYLENOL) 500 MG tablet Take 500 mg by mouth 2 (two) times daily.    Yes [provider]   Carbidopa-Levodopa ER (SINEMET CR) 25-100 MG tablet controlled release TAKE 2 AND 1/2 TABLETS BY MOUTH EVERY 2 HOURS   Yes [provider]  clonazePAM (KLONOPIN) 1 MG tablet Take 1 mg by mouth 4 (four) times daily.   Yes [provider]  entacapone (COMTAN) 200 MG tablet Take 200 mg by mouth 6 (six) times daily. With carbidopa   Yes [provider]  escitalopram (LEXAPRO) 10 MG tablet Take 10 mg by mouth daily.   Yes [provider]  melatonin 5 MG TABS Take 1 tablet (5 mg total) by mouth at bedtime. 12/13/19  Yes Ezekiel Slocumb, DO  neomycin-polymyxin-dexamethasone (MAXITROL) 0.1 % ophthalmic suspension Place 1 drop into the left eye daily.    Yes [provider]  omeprazole (PRILOSEC) 20 MG capsule Take 1 capsule (20 mg total) by mouth daily. 03/07/18 11/22/24 Yes Vevelyn Francois, NP  prednisoLONE acetate (PRED FORTE) 1 % ophthalmic suspension Place 1 drop into the left eye 4 (four) times daily.    Yes [provider]  pregabalin (LYRICA) 100 MG capsule Take 100 mg by mouth 3 (three) times daily. 11/18/19  Yes [provider]  QUEtiapine (SEROQUEL) 100 MG tablet Take 100 mg by mouth at bedtime.   Yes [provider]  QUEtiapine (SEROQUEL) 50 MG tablet Take 50 mg by mouth 3 (three) times daily.   Yes [provider]  traZODone (DESYREL) 50 MG tablet Take 25-50 mg by mouth at bedtime.  10/04/18  Yes [provider]    Allergies Percocet [oxycodone-acetaminophen], Baclofen, Codeine sulfate, Gabapentin, Terazosin, and Tramadol hcl  Family History  Problem Relation Age of Onset  . Heart disease Mother   . Alcohol abuse Father   . Breast cancer Neg Hx   . Mental illness Neg Hx     Social History Social History   Tobacco Use  . Smoking status: Former Research scientist (life sciences)  . Smokeless tobacco: Never Used  Vaping Use  . Vaping Use: Never used  Substance Use Topics  . Alcohol use: Yes    Alcohol/week: 0.0 standard  drinks  . Drug use: No    Review of Systems Level 5 caveat: Unable to obtain review of systems due to dementia    ____________________________________________   PHYSICAL EXAM:  VITAL SIGNS: ED Triage Vitals  Enc Vitals Group     BP 03/27/20 2320 (!) 174/100     Pulse Rate 03/27/20 2320 69     Resp 03/27/20 2320 18     Temp 03/27/20 2320 97.7 F (36.5 C)     Temp Source 03/27/20 2320 Oral     SpO2 03/27/20 2320 96 %     Weight 03/27/20 2324 150 lb (68 kg)     Height 03/27/20 2324 5\' 6"  (1.676 m)     Head Circumference --      Peak Flow --      Pain Score 03/27/20 2323 0     Pain Loc --      Pain Edu? --      Excl. in [place]? --     Constitutional: Alert, mildly confused.  Relatively well appearing and in no acute distress. Eyes: Conjunctivae are normal.  EOMI. Head: Atraumatic.  Nose: No congestion/rhinnorhea. Mouth/Throat: Mucous membranes are dry.   Neck: Normal range of motion.  Cardiovascular: Normal rate, regular rhythm. Grossly normal heart sounds.  Good peripheral circulation. Respiratory: Normal respiratory effort.  No retractions. Lungs CTAB. Gastrointestinal: Soft and nontender. No distention.  Genitourinary: No flank tenderness. Musculoskeletal: No lower extremity edema.  Extremities warm and well perfused.  Full range of motion to bilateral upper and lower extremities.  No focal bony tenderness. Neurologic: Slightly slurred speech.  Motor intact in all extremities. Skin:  Skin is warm and dry. No rash noted. Psychiatric: Calm and cooperative.  ____________________________________________   LABS (all labs ordered are listed, but only abnormal results are displayed)  Labs Reviewed  CBC WITH DIFFERENTIAL/PLATELET - Abnormal; Notable for the following components:      Result Value   Hemoglobin 11.2 (*)    All other components within normal limits  COMPREHENSIVE METABOLIC PANEL - Abnormal; Notable for the following components:   Potassium 3.4 (*)     Glucose, Bld 104 (*)    Creatinine, Ser 0.40 (*)    Calcium 8.6 (*)    Total Protein 5.9 (*)    AST 10 (*)    All other components within normal limits  URINALYSIS, COMPLETE (UACMP) WITH MICROSCOPIC - Abnormal; Notable for the following components:   Color, Urine AMBER (*)    APPearance CLOUDY (*)    Specific Gravity, Urine 1.033 (*)    Ketones, ur 20 (*)    Protein, ur 30 (*)    Leukocytes,Ua LARGE (*)    All other components within normal limits  TROPONIN I (HIGH SENSITIVITY)   ____________________________________________  EKG  ED ECG REPORT I, Arta Silence, the attending physician, personally viewed and interpreted this ECG.  Date: 03/28/2020 EKG Time: 2321 Rate: 70 Rhythm: normal sinus rhythm QRS Axis: normal Intervals: normal ST/T Wave abnormalities: normal Narrative Interpretation: no evidence of acute ischemia  ____________________________________________  RADIOLOGY  CT head: No acute abnormality  ____________________________________________   PROCEDURES  Procedure(s) performed: No  Procedures  Critical Care performed: No ____________________________________________   INITIAL IMPRESSION / ASSESSMENT AND PLAN / ED COURSE  Pertinent labs & imaging results that were available during my care of the patient were reviewed by me and considered in my medical decision making (see chart for details).  69 year old female with PMH as noted above including Parkinson's disease and dementia presents due to increased generalized weakness and frequent falls over the last few weeks, as well as worsening aggression and hallucinations reported by her husband.  I reviewed the past medical records in [place].  The patient was most recently seen in the ED on 8/6 after a fall with negative work-up.  She was seen in July for increasing erratic behavior and was observed by psychiatry for several days before being admitted to [facility].  Of note, the ED is  currently extremely busy with very long wait times and the patient waited for over 16 hours prior to being placed in an exam room.  On exam, she is alert and appears mildly confused.  She has slightly slurred speech although it is unclear if this is her baseline.  Neurologic exam is nonfocal.  There is no visible trauma.  She has good range of motion to all extremities and no bony tenderness.  The remainder of the exam is as described above.  Lab work-up obtained late last night after the patient arrived is within normal limits for her.  Urinalysis is negative.  CT head shows no  acute findings.  Overall presentation is consistent with symptoms related to ongoing Parkinson's dementia.  1.  Behavioral disturbance: The patient recently had some medication changes.  She may need readmission to [facility] or possible medication adjustments.  I have consulted psychiatry and TTS to evaluate her.  2.  Worsening weakness and falls: This is also likely related to Parkinson's.  If she is cleared by psychiatry I will obtain PT and social work evaluations for possible placement, as based on the husband's report it does not appear that she is safe at home on her current level of care.  ----------------------------------------- 10:46 PM on 03/28/2020 -----------------------------------------  The patient was evaluated by the TTS provider during the late afternoon.  I just touched base with the evening TTS provider, and the plan will be for observation overnight and in person assessment by the psychiatry MD in the morning.  I updated the patient and husband on the plan of care.  If the patient is cleared by psychiatry she will need PT and social work evaluation.  I will sign the patient out to the oncoming ED physician at 11 PM.  _________________________  The patient has been placed in psychiatric observation due to the need to provide a safe environment for the patient while obtaining psychiatric  consultation and evaluation, as well as ongoing medical and medication management to treat the patient's condition.  The patient has not been placed under full IVC at this time. ____________________________________________   FINAL CLINICAL IMPRESSION(S) / ED DIAGNOSES  Final diagnoses:  Dementia due to Parkinson's disease with behavioral disturbance ([place])      NEW MEDICATIONS STARTED DURING THIS VISIT:  New Prescriptions   No medications on file     Note:  This document was prepared using Dragon voice recognition software and may include unintentional dictation errors.    Arta Silence, MD 03/28/20 [phone]

## 2020-03-28 NOTE — TOC Initial Note (Signed)
Transition of Care [facility]) - CM/SW Discharge Note   Patient Details  Name: Sarah Donaldson MRN: [id number] Date of Birth: 06-28-1951  Transition of Care [facility]) CM/SW Contact:  Adelene Amas, [place] Phone Number: 03/28/2020, 12:58 PM   Clinical Narrative:     Patient active with [place] RN, PT, OT.         Patient Goals and CMS Choice        Discharge Placement                       Discharge Plan and Services                                     Social Determinants of Health (SDOH) Interventions     Readmission Risk Interventions No flowsheet data found.

## 2020-03-28 NOTE — BH Assessment (Addendum)
Per EDP Dr. Charlynn Court plan is for patient to be observed overnight and assessed by Psyc Provider tomorrow 03/29/20, potential Social Work consult if patient is Psyc cleared

## 2020-03-28 NOTE — ED Notes (Signed)
Pt given meal tray.

## 2020-03-29 DIAGNOSIS — G3183 Dementia with Lewy bodies: Secondary | ICD-10-CM | POA: Diagnosis not present

## 2020-03-29 MED ORDER — TRIFLUOPERAZINE HCL 2 MG PO TABS
1.0000 mg | ORAL_TABLET | Freq: Two times a day (BID) | ORAL | Status: DC
  Administered 2020-03-29 – 2020-03-30 (×4): 1 mg via ORAL
  Filled 2020-03-29 (×4): qty 1

## 2020-03-29 MED ORDER — CLONAZEPAM 0.5 MG PO TABS
1.0000 mg | ORAL_TABLET | Freq: Four times a day (QID) | ORAL | Status: DC
  Administered 2020-03-29 – 2020-03-30 (×3): 1 mg via ORAL
  Filled 2020-03-29 (×3): qty 2

## 2020-03-29 NOTE — Consult Note (Addendum)
[facility] Consult   Reason for Consult:   Psych symptoms of Parkinson's disease  Referring Physician:  ER MD  Patient Identification: Sarah Donaldson MRN:  [id number] Principal Diagnosis: <principal problem not specified> Diagnosis:  Active Problems:   * No active hospital problems. 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 adult patient admitted with  Above symptoms associated with Parkinson's disease  Seen before by Psych   HPI:   As above -brought in by  Husband for worsening delusions, hallucinations and anxiety restlessness agitation and lack of sleep  Patient often known to minimize and not recall events and issues.     Past Psychiatric History:  Followed by neurology no recent psych admits    Risk to Self: Suicidal Ideation: No Suicidal Intent: No Is patient at risk for suicide?: No Suicidal Plan?: No Access to Means: No What has been your use of drugs/alcohol within the last 12 months?: Reports of none Other Self Harm Risks: Reports of none Triggers for Past Attempts: None known Intentional Self Injurious Behavior: None Risk to Others: Homicidal Ideation: No Thoughts of Harm to Others: No Current Homicidal Intent: No Current Homicidal Plan: No Access to Homicidal Means: No Identified Victim: Reports of none History of harm to others?: No Assessment of Violence: None Noted Violent Behavior Description: Reports of none Does patient have access to weapons?: No Criminal Charges Pending?: No Does patient have a court date: No Prior Inpatient Therapy: Prior Inpatient Therapy: Yes Prior Therapy Dates: 02/2020 Prior Therapy Facilty/Provider(s): [facility] Reason for Treatment: 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  : No Does patient have [facility] services? : No Does patient have P4CC services?: No  Past Medical History:  Past Medical History:  Diagnosis Date   Cancer ([place])    Non-hodgkin's lymphoma (in remission)   GERD (gastroesophageal reflux disease)    well-controlled w/ omeprazole   Hyperlipidemia    Knee joint cyst, left 02/07/2018   Parkinson disease ([place])    Parkinson disease ([place])     Past Surgical History:  Procedure Laterality Date   BACK SURGERY     BUNIONECTOMY     NASAL SEPTUM SURGERY     RETINAL DETACHMENT SURGERY     TOTAL KNEE ARTHROPLASTY Left 06/07/2019   Procedure: TOTAL KNEE ARTHROPLASTY;  Surgeon: Lovell Sheehan, MD;  Location: [facility] ORS;  Service: Orthopedics;  Laterality: Left;   Family History:  Family History  Problem Relation Age of Onset   Heart disease Mother    Alcohol abuse Father    Breast cancer Neg Hx    Mental illness Neg Hx    Family Psychiatric  History: previously noted  Social History:  Social History   Substance and Sexual Activity  Alcohol Use Yes   Alcohol/week: 0.0 standard drinks     Social History   Substance and Sexual Activity  Drug Use No    Social History   Socioeconomic History   Marital status: Married    Spouse name: Haddie Bruhl   Number of children: 4   Years of education: Not on file   Highest education level: Not on file  Occupational History    Comment: retired  Tobacco Use   Smoking status: Former Smoker  Smokeless tobacco: Never Used  Vaping Use   Vaping Use: Never used  Substance and Sexual Activity   Alcohol use: Yes    Alcohol/week: 0.0 standard drinks   Drug use: No   Sexual activity: Not Currently  Other Topics Concern   Not on file  Social History Narrative   Not on file   Social Determinants of Health   Financial Resource Strain: Low Risk    Difficulty of Paying Living Expenses: Not hard at all  Food  Insecurity: No Food Insecurity   Worried About Charity fundraiser in the Last Year: Never true   [place] in the Last Year: Never true  Transportation Needs: No Transportation Needs   Lack of Transportation (Medical): No   Lack of Transportation (Non-Medical): No  Physical Activity: Inactive   Days of Exercise per Week: 0 days   Minutes of Exercise per Session: 0 min  Stress: Stress Concern Present   Feeling of Stress : To some extent  Social Connections: Unknown   Frequency of Communication with Friends and Family: Not on file   Frequency of Social Gatherings with Friends and Family: Not on file   Attends Religious Services: More than 4 times per year   Active Member of [employer] or Organizations: No   Attends Archivist Meetings: Never   Marital Status: Married   Additional Social History:    Allergies:   Allergies  Allergen Reactions   Percocet [Oxycodone-Acetaminophen]     Hallucination   Baclofen     Caused numbness in mouth    Codeine Sulfate Nausea And Vomiting   Gabapentin Swelling    Mouth swelling    Terazosin    Tramadol Hcl     Interferes with Serotonin levels which affects her Parkinsons    Labs:  Results for orders placed or performed during the hospital encounter of 03/28/20 (from the past 48 hour(s))  CBC with Differential     Status: Abnormal   Collection Time: 03/27/20 11:27 PM  Result Value Ref Range   WBC 5.6 4.0 - 10.5 K/uL   RBC 3.99 3.87 - 5.11 MIL/uL   Hemoglobin 11.2 (L) 12.0 - 15.0 g/dL   HCT 36.0 36 - 46 %   MCV 90.2 80.0 - 100.0 fL   MCH 28.1 26.0 - 34.0 pg   MCHC 31.1 30.0 - 36.0 g/dL   RDW 13.4 11.5 - 15.5 %   Platelets 195 150 - 400 K/uL   nRBC 0.0 0.0 - 0.2 %   Neutrophils Relative % 63 %   Neutro Abs 3.5 1.7 - 7.7 K/uL   Lymphocytes Relative 25 %   Lymphs Abs 1.4 0.7 - 4.0 K/uL   Monocytes Relative 10 %   Monocytes Absolute 0.5 0 - 1 K/uL   Eosinophils Relative 2 %   Eosinophils Absolute 0.1 0  - 0 K/uL   Basophils Relative 0 %   Basophils Absolute 0.0 0 - 0 K/uL   Immature Granulocytes 0 %   Abs Immature Granulocytes 0.02 0.00 - 0.07 K/uL    Comment: Performed at [facility], [address] [place], [place] [address]  Comprehensive metabolic panel     Status: Abnormal   Collection Time: 03/27/20 11:27 PM  Result Value Ref Range   Sodium 143 135 - 145 mmol/L   Potassium 3.4 (L) 3.5 - 5.1 mmol/L   Chloride 107 98 - 111 mmol/L   CO2 26 22 - 32 mmol/L   Glucose,  Bld 104 (H) 70 - 99 mg/dL    Comment: Glucose reference range applies only to samples taken after fasting for at least 8 hours.   BUN 21 8 - 23 mg/dL   Creatinine, Ser 0.40 (L) 0.44 - 1.00 mg/dL   Calcium 8.6 (L) 8.9 - 10.3 mg/dL   Total Protein 5.9 (L) 6.5 - 8.1 g/dL   Albumin 3.7 3.5 - 5.0 g/dL   AST 10 (L) 15 - 41 U/L   ALT <5 0 - 44 U/L   Alkaline Phosphatase 104 38 - 126 U/L   Total Bilirubin 0.9 0.3 - 1.2 mg/dL   GFR calc non Af Amer >60 >60 mL/min   GFR calc Af Amer >60 >60 mL/min   Anion gap 10 5 - 15    Comment: Performed at [facility], [place], [place] [address]  Troponin I (High Sensitivity)     Status: None   Collection Time: 03/27/20 11:27 PM  Result Value Ref Range   Troponin I (High Sensitivity) <2 <18 ng/L    Comment: (NOTE) Elevated high sensitivity troponin I (hsTnI) values and significant  changes across serial measurements may suggest ACS but many other  chronic and acute conditions are known to elevate hsTnI results.  Refer to the "Links" section for chest pain algorithms and additional  guidance. Performed at [facility], [address] [place], [place] [address]   Urinalysis, Complete w Microscopic     Status: Abnormal   Collection Time: 03/27/20 11:42 PM  Result Value Ref Range   Color, Urine AMBER (A) YELLOW    Comment: BIOCHEMICALS MAY BE AFFECTED BY COLOR   APPearance CLOUDY (A) CLEAR   Specific Gravity, Urine 1.033 (H) 1.005 -  1.030   pH 5.0 5.0 - 8.0   Glucose, UA NEGATIVE NEGATIVE mg/dL   Hgb urine dipstick NEGATIVE NEGATIVE   Bilirubin Urine NEGATIVE NEGATIVE   Ketones, ur 20 (A) NEGATIVE mg/dL   Protein, ur 30 (A) NEGATIVE mg/dL   Nitrite NEGATIVE NEGATIVE   Leukocytes,Ua LARGE (A) NEGATIVE   RBC / HPF 0-5 0 - 5 RBC/hpf   WBC, UA NONE SEEN 0 - 5 WBC/hpf   Bacteria, UA NONE SEEN NONE SEEN   Squamous Epithelial / LPF 0-5 0 - 5   Mucus PRESENT    Ca Oxalate Crys, UA PRESENT     Comment: Performed at [facility], [address] [place], [place] [address]    Current Facility-Administered Medications  Medication Dose Route Frequency Provider Last Rate Last Admin   carbidopa-levodopa (SINEMET IR) 25-100 MG per tablet immediate release 1 tablet  1 tablet Oral Q2H while awake Arta Silence, MD   1 tablet at 03/29/20 1243   entacapone (COMTAN) tablet 200 mg  200 mg Oral QID Arta Silence, MD   200 mg at 03/29/20 1000   escitalopram (LEXAPRO) tablet 10 mg  10 mg Oral Daily Arta Silence, MD   10 mg at 03/29/20 1000   pantoprazole (PROTONIX) EC tablet 20 mg  20 mg Oral Daily Arta Silence, MD   20 mg at 03/29/20 1001   pregabalin (LYRICA) capsule 100 mg  100 mg Oral TID Arta Silence, MD   100 mg at 03/29/20 1001   QUEtiapine (SEROQUEL) tablet 100 mg  100 mg Oral QHS Arta Silence, MD   100 mg at 03/28/20 2227   QUEtiapine (SEROQUEL) tablet 50 mg  50 mg Oral TID Arta Silence, MD   50 mg at 03/29/20  1000   traZODone (DESYREL) tablet 50 mg  50 mg Oral QHS Arta Silence, MD   50 mg at 03/28/20 2227   Current Outpatient Medications  Medication Sig Dispense Refill   acetaminophen (TYLENOL) 500 MG tablet Take 500 mg by mouth 2 (two) times daily.      Carbidopa-Levodopa ER (SINEMET CR) 25-100 MG tablet controlled release TAKE 2 AND 1/2 TABLETS BY MOUTH EVERY 2 HOURS     clonazePAM (KLONOPIN) 1 MG tablet Take 1 mg by mouth 4 (four) times daily.      entacapone (COMTAN) 200 MG tablet Take 200 mg by mouth 6 (six) times daily. With carbidopa     escitalopram (LEXAPRO) 10 MG tablet Take 10 mg by mouth daily.     melatonin 5 MG TABS Take 1 tablet (5 mg total) by mouth at bedtime.  0   neomycin-polymyxin-dexamethasone (MAXITROL) 0.1 % ophthalmic suspension Place 1 drop into the left eye daily.      omeprazole (PRILOSEC) 20 MG capsule Take 1 capsule (20 mg total) by mouth daily. 90 capsule 0   prednisoLONE acetate (PRED FORTE) 1 % ophthalmic suspension Place 1 drop into the left eye 4 (four) times daily.      pregabalin (LYRICA) 100 MG capsule Take 100 mg by mouth 3 (three) times daily.     QUEtiapine (SEROQUEL) 100 MG tablet Take 100 mg by mouth at bedtime.     QUEtiapine (SEROQUEL) 50 MG tablet Take 50 mg by mouth 3 (three) times daily.     traZODone (DESYREL) 50 MG tablet Take 25-50 mg by mouth at bedtime.       Musculoskeletal: Strength & Muscle Tone: limited  Gait & Station:  Limited needs assistance  Patient leans: both   Cognition impaired Recall impaired ADL's affected in general Aims not done Akathisia --not present Handedness not known Language normal  Sleep erratic  Psychomotor activity agitated at times Assets supportive family    Thin sickly gaunt forlorn more verbal this time around Oriented to name and part of date Sleep irritable edgy frustrated Wants to go home  Mood and affect about the same as above No active SI HI and all Thought process and content --husband reports delusions and hallucinations and anxiety  Cannot cooperate with memory  Judgement insight reliability intelligence fund of knowledge all lowered or impaired Abstraction --more concrete in general  Consciousness somewhat clouded Concentration and attention fair to poor  Speech somewhat low   A/p   Ongoing chronic manifestations of late parkinson's disease also meds can cause as well.   Spoke with husband who says she has problems  mostly at five pm with some form of sundowning and also throughout the day   Will have to give a moderate antipsychotic that has less anticholinergic and parkinson effect Such as stellazine or loxitane   Will see if this helps and see if she can either go home or be observed for one day  Prior  Klonopin restarted because she has been on this over two years at high doses and so we cannot risk withdrawal on top of her complexities   Not clear if family is willing to consider NH placement and or what or where their thoughts and feelings are in this arena       Psychiatric Specialty Exam: Physical Exam  Review of Systems  Blood pressure 107/73, pulse 71, temperature (!) 97.5 F (36.4 C), temperature source Oral, resp. rate 18, height 5\' 6"  (1.676 m), weight 68 kg,  SpO2 95 %.Body mass index is 24.21 kg/m.   Treatment Plan Summary: Medication management  Disposition: Supportive therapy provided about ongoing stressors.  Eulas Post, MD 03/29/2020 2:10 PM

## 2020-03-29 NOTE — ED Notes (Signed)
Room darkened for patient's comfort. Overhead light turned on. Patient is calm and cooperative. NAD.

## 2020-03-29 NOTE — ED Notes (Signed)
PT at bedside.

## 2020-03-29 NOTE — ED Notes (Signed)
Patient ambulated with slow, shuffling gait and use of walker to and from room commode.

## 2020-03-29 NOTE — ED Notes (Signed)
Hourly rounding reveals patient sleeping in room. No complaints, stable, in no acute distress.

## 2020-03-29 NOTE — Evaluation (Signed)
Physical Therapy Evaluation Patient Details Name: Sarah Donaldson MRN: [id number] DOB: Oct 25, 1950 Today's Date: 03/29/2020   History of Present Illness  Pt is a 69 y.o. female with PMH that includes Parkinson's disease, non-hodgkin's lymphoma (in remission), GERD, back surgery, L TKA  Oct 2020, falls, and dementia.  MD assessment includes: behavioral disturbances, weakness, and falls.   Clinical Impression  Pt was pleasant and motivated to participate and overall performed well with good effort during the session.  Pt required no physical assistance with any functional task and was able to ambulate 44' with a RW without LOB and with SpO2 and HR WNL.  Pt volunteered that she uses a w/c frequently at home to avoid falling but that she felt like she would benefit from walking more.  During this PT evaluation pt quite easily was able to ambulate household distances and would be safe to return home with supervision for all transfers and ambulation secondary to recent history of falling.  Pt will also benefit from continued HHPT services to safely address deficits listed in patient problem list for decreased caregiver assistance, decreased risk of further functional decline, and decreased risk of future falls.        Follow Up Recommendations Home health PT;Other (comment);Supervision for mobility/OOB (Pt currently receiving HHPT services)    Equipment Recommendations  None recommended by PT    Recommendations for Other Services       Precautions / Restrictions Precautions Precautions: Fall Restrictions Weight Bearing Restrictions: No      Mobility  Bed Mobility Overal bed mobility: Modified Independent             General bed mobility comments: Extra time and effort but no physical assist required during sup to/from sit  Transfers Overall transfer level: Needs assistance Equipment used: Rolling walker (2 wheeled) Transfers: Sit to/from Stand Sit to Stand: Min guard          General transfer comment: Good eccentric and concentric control and stability  Ambulation/Gait Ambulation/Gait assistance: Min guard Gait Distance (Feet): 60 Feet Assistive device: Rolling walker (2 wheeled) Gait Pattern/deviations: Step-through pattern;Decreased step length - right;Decreased step length - left Gait velocity: decreased   General Gait Details: Slow cadence with short B step length but steady without LOB  Stairs            Wheelchair Mobility    Modified Rankin (Stroke Patients Only)       Balance Overall balance assessment: Needs assistance   Sitting balance-Leahy Scale: Good     Standing balance support: Bilateral upper extremity supported;During functional activity Standing balance-Leahy Scale: Good                               Pertinent Vitals/Pain Pain Assessment: No/denies pain    Home Living Family/patient expects to be discharged to:: Private residence Living Arrangements: Spouse/significant other Available Help at Discharge: Family Type of Home: House Home Access: Stairs to enter Entrance Stairs-Rails: Right Entrance Stairs-Number of Steps: 1 Home Layout: One level Home Equipment: Grab bars - tub/shower;Grab bars - toilet;Walker - 2 wheels;Electric scooter;Wheelchair - manual;Bedside commode      Prior Function Level of Independence: Needs assistance   Gait / Transfers Assistance Needed: Pt reports being able to ambulate household distances with a RW, h/o frequent falls secondary to "lose my balance because of my Parkinson's"  ADL's / Homemaking Assistance Needed: Pt reports husband helps with bathing and dressing  Comments: Pt  reports currently receiving HHPT services     Hand Dominance        Extremity/Trunk Assessment   Upper Extremity Assessment Upper Extremity Assessment: Generalized weakness    Lower Extremity Assessment Lower Extremity Assessment: Generalized weakness       Communication    Communication: No difficulties  Cognition Arousal/Alertness: Awake/alert Behavior During Therapy: WFL for tasks assessed/performed Overall Cognitive Status: Within Functional Limits for tasks assessed                                 General Comments: Pt A&O x 3 and able to follow commands with no extra time or cuing required      General Comments      Exercises Total Joint Exercises Ankle Circles/Pumps: AROM;Strengthening;Both;10 reps;5 reps Quad Sets: Strengthening;Both;10 reps Gluteal Sets: Strengthening;Both;10 reps Towel Squeeze: Strengthening;Both;10 reps Heel Slides: AROM;Strengthening;Both;5 reps Hip ABduction/ADduction: AROM;AAROM;Both;10 reps Straight Leg Raises: AROM;AAROM;Both;10 reps Long Arc Quad: AROM;Strengthening;Both;10 reps Marching in Standing: AROM;Both;5 reps;Standing   Assessment/Plan    PT Assessment Patient needs continued PT services  PT Problem List Decreased strength;Decreased activity tolerance;Decreased balance;Decreased mobility       PT Treatment Interventions DME instruction;Gait training;Stair training;Functional mobility training;Therapeutic activities;Therapeutic exercise;Balance training;Patient/family education    PT Goals (Current goals can be found in the Care Plan section)  Acute Rehab PT Goals Patient Stated Goal: To walk better PT Goal Formulation: With patient Time For Goal Achievement: 04/11/20 Potential to Achieve Goals: Good    Frequency Min 2X/week   Barriers to discharge        Co-evaluation               AM-PAC PT "6 Clicks" Mobility  Outcome Measure Help needed turning from your back to your side while in a flat bed without using bedrails?: None Help needed moving from lying on your back to sitting on the side of a flat bed without using bedrails?: None Help needed moving to and from a bed to a chair (including a wheelchair)?: A Little Help needed standing up from a chair using your arms (e.g.,  wheelchair or bedside chair)?: A Little Help needed to walk in hospital room?: A Little Help needed climbing 3-5 steps with a railing? : A Little 6 Click Score: 20    End of Session Equipment Utilized During Treatment: Gait belt Activity Tolerance: Patient tolerated treatment well Patient left: in bed;with nursing/sitter in room;with call bell/phone within reach Nurse Communication: Mobility status PT Visit Diagnosis: Difficulty in walking, not elsewhere classified (R26.2);Muscle weakness (generalized) (M62.81);History of falling (Z91.81)    Time: 1550-1616 PT Time Calculation (min) (ACUTE ONLY): 26 min   Charges:   PT Evaluation $PT Eval Moderate Complexity: 1 Mod PT Treatments $Therapeutic Exercise: 8-22 mins       D. Royetta Asal PT, DPT 03/29/20, 4:48 PM

## 2020-03-29 NOTE — ED Notes (Signed)
Patient ambulated with slow, halting gait to and from room commode with use of walker. Patient is oriented to self, patient is cooperative, but unsure of why she is here.

## 2020-03-29 NOTE — ED Notes (Signed)
Husband at bedside.  

## 2020-03-29 NOTE — ED Notes (Signed)
Patient ambulated to and from room commode with slow, shuffling gait and use of walker. STand-by assist. Report given to April B RN

## 2020-03-30 DIAGNOSIS — G3183 Dementia with Lewy bodies: Secondary | ICD-10-CM | POA: Diagnosis not present

## 2020-03-30 DIAGNOSIS — G20 Parkinson's disease: Secondary | ICD-10-CM | POA: Diagnosis not present

## 2020-03-30 DIAGNOSIS — F0281 Dementia in other diseases classified elsewhere with behavioral disturbance: Secondary | ICD-10-CM | POA: Diagnosis not present

## 2020-03-30 MED ORDER — ENTACAPONE 200 MG PO TABS: 200.0000 mg | ORAL_TABLET | Freq: Four times a day (QID) | ORAL | 0 refills | Status: DC

## 2020-03-30 MED ORDER — PANTOPRAZOLE SODIUM 20 MG PO TBEC
20.0000 mg | DELAYED_RELEASE_TABLET | Freq: Every day | ORAL | 1 refills | Status: DC
Start: 2020-03-31 — End: 2020-05-28

## 2020-03-30 MED ORDER — TRIFLUOPERAZINE HCL 1 MG PO TABS: 1.0000 mg | ORAL_TABLET | Freq: Two times a day (BID) | ORAL | 1 refills | Status: DC

## 2020-03-30 NOTE — ED Notes (Signed)
Pt sleeping. 

## 2020-03-30 NOTE — ED Notes (Signed)
Pt voided in toilet. States "walking better" than usual. Pt requested both side rails up.

## 2020-03-30 NOTE — ED Notes (Signed)
Pt continues to sleep. resps unlabored.

## 2020-03-30 NOTE — ED Notes (Addendum)
Pt assisted up to commode for urination, hands washed , sheets changed and pt back to bed. Po fluids provided. Pt taking sips of po fluids.

## 2020-03-30 NOTE — Consult Note (Signed)
[facility] Psych ED Discharge  03/30/2020 10:58 AM Sarah Donaldson  MRN:  [id number] Principal Problem: Dementia due to Parkinson's disease with behavioral disturbance Sarah Donaldson) Discharge Diagnoses: Principal Problem:   Dementia due to Parkinson's disease with behavioral disturbance ([place])  Subjective: "I'm doing fine."  Patient seen and evaluated in person by this provider and TTS, Sarah Donaldson.  She is calmly eating her breakfast, no agitation or behavioral issues.  Denies hallucinations and not responding to internal stimuli.  No suicidal/homicidal ideations, mania, or other concerning psychiatric issues.  Her husband desires for her to return home, does not want her to go to a geriatric psychiatric [facility].  Dr. Dwyane Dee reviewed this patient and concurs with the plan for her to return home with medication changes that are in place.  Psychiatrically stable for discharge.  HPI per [facility] Specialist Sarah Donaldson on 8/19: Sarah Donaldson is an 69 y.o. adult who presents to the ER via her husband due to her current behaviors in the home. Per the report of the patient, her husband is overbearing and at frustrates her at times. She also reports, when she is in the middle of doing something, she doesn't like to stop and get upset with the husband because he wants her to do things. Patient admits its things she needs to do, such as take her medications, eat food and or go to bed. Per the husband Sarah Donaldson) the past three days, the patient has been increasingly agitated and non-complaint. It takes him several hours to get her to do things. She's going to bed at later times but continues to get her proper sleep, which is approximately twelve hours. He also shared, he has had to contact the patient's sisters to speak with her to help calm her down, which is a new behavior for her. Husband further shared the patient was recently told by Sarah Donaldson, she can no longer use her walker but will need to use the wheelchair.  However, the patient continues to walk and uses the wheelchair as a walker. She was falling approximately twenty times a day. One of the previous falls resulted in her hitting her head and having a brain bleed, which is why she is to no longer use the walker. Sarah Donaldson made the recommendation last Thursday (03/21/2020). Since then the patient's behaviors have worsened. Husband reports, it's usually around 5:00pm when she starts to get agitated. This around the time she starts to fully wake up. Now that she goes to bed at later times, she get up latter. She gets her medications and take several naps, around 5pm is when she's fully up and alert. This is the times when she's busy doing thing and when she's preoccupied with them, she doesn't like to stop and that's when the patient become irritable and frustrated with the husband. He have her to take medications and or other things, such as eating.  While speaking with the patient and her husband, it was shared each time a medical provider restricts what she can do, because of the Parkinson, she has a difficult time adjusting. It results in her being irritable and easily agitated. With this change, the patient admits, this is the worse she's been and she doesn't like it. "I can do things for myself. He (husband) always telling me to do this and that. I'll get to when I do." Patient reports, her husband isn't abusive but she's annoyed with him because he tells her to do things. She also shared, she knows  he does it because he cares but she still doesn't like it. They also discussed how the husband is trying to make sure the patient remains in the home and receives care and not go to a facility. He recently started working with an attorney through "[employer]" to ensure paperwork is in place to ensure she stay in the home and receive care, in the event something happens to him. Husband stated, the patient has told him several times, he was trying to put her "in a  (nursing) home" but he denies it. He stated, "I even had the lawyer to talk with her about it but she still doesn't believe it." He believes this has something to do with her current behaviors as well.  During the interview, the patient was calm, cooperative and pleasant. She was able to provide appropriate answers to the questions. Throughout the interview, she denied SI/HI and AV/H.   When asked what they hope to accomplished with this ER visit, the patient stated nothing and she was ready to leave. The husband shared, he was concerned about the increase agitation over the last three days, because she's never been like this before.  Total Time spent with patient: 45 minutes  Past Psychiatric History: parkinson's dementia  Past Medical History:  Past Medical History:  Diagnosis Date  . Cancer ([place])    Non-hodgkin's lymphoma (in remission)  . GERD (gastroesophageal reflux disease)    well-controlled w/ omeprazole  . Hyperlipidemia   . Knee joint cyst, left 02/07/2018  . Parkinson disease ([place])   . Parkinson disease [facility])     Past Surgical History:  Procedure Laterality Date  . BACK SURGERY    . BUNIONECTOMY    . NASAL SEPTUM SURGERY    . RETINAL DETACHMENT SURGERY    . TOTAL KNEE ARTHROPLASTY Left 06/07/2019   Procedure: TOTAL KNEE ARTHROPLASTY;  Surgeon: Lovell Sheehan, MD;  Location: [facility] ORS;  Service: Orthopedics;  Laterality: Left;   Family History:  Family History  Problem Relation Age of Onset  . Heart disease Mother   . Alcohol abuse Father   . Breast cancer Neg Hx   . Mental illness Neg Hx    Family Psychiatric  History: see above Social History:  Social History   Substance and Sexual Activity  Alcohol Use Yes  . Alcohol/week: 0.0 standard drinks     Social History   Substance and Sexual Activity  Drug Use No    Social History   Socioeconomic History  . Marital status: Married    Spouse name: [name]  . Number of children: 4  . Years of  education: Not on file  . Highest education level: Not on file  Occupational History    Comment: retired  Tobacco Use  . Smoking status: Former Research scientist (life sciences)  . Smokeless tobacco: Never Used  Vaping Use  . Vaping Use: Never used  Substance and Sexual Activity  . Alcohol use: Yes    Alcohol/week: 0.0 standard drinks  . Drug use: No  . Sexual activity: Not Currently  Other Topics Concern  . Not on file  Social History Narrative  . Not on file   Social Determinants of Health   Financial Resource Strain: Low Risk   . Difficulty of Paying Living Expenses: Not hard at all  Food Insecurity: No Food Insecurity  . Worried About Charity fundraiser in the Last Year: Never true  . Ran Out of Food in the Last Year: Never true  Transportation  Needs: No Transportation Needs  . Lack of Transportation (Medical): No  . Lack of Transportation (Non-Medical): No  Sarah Activity: Inactive  . Days of Exercise per Week: 0 days  . Minutes of Exercise per Session: 0 min  Stress: Stress Concern Present  . Feeling of Stress : To some extent  Social Connections: Unknown  . Frequency of Communication with Friends and Family: Not on file  . Frequency of Social Gatherings with Friends and Family: Not on file  . Attends Religious Services: More than 4 times per year  . Active Member of Clubs or Organizations: No  . Attends Archivist Meetings: Never  . Marital Status: Married    Has this patient used any form of tobacco in the last 30 days? (Cigarettes, Smokeless Tobacco, Cigars, and/or Pipes) NA  Current Medications: Current Facility-Administered Medications  Medication Dose Route Frequency Provider Last Rate Last Admin  . carbidopa-levodopa (SINEMET IR) 25-100 MG per tablet immediate release 1 tablet  1 tablet Oral Q2H while awake Arta Silence, MD   1 tablet at 03/30/20 1000  . clonazePAM (KLONOPIN) tablet 1 mg  1 mg Oral QID Eulas Post, MD   1 mg at 03/30/20 7824  . entacapone  (COMTAN) tablet 200 mg  200 mg Oral QID Arta Silence, MD   200 mg at 03/30/20 2353  . escitalopram (LEXAPRO) tablet 10 mg  10 mg Oral Daily Arta Silence, MD   10 mg at 03/30/20 1000  . pantoprazole (PROTONIX) EC tablet 20 mg  20 mg Oral Daily Arta Silence, MD   20 mg at 03/30/20 1000  . pregabalin (LYRICA) capsule 100 mg  100 mg Oral TID Arta Silence, MD   100 mg at 03/30/20 0959  . QUEtiapine (SEROQUEL) tablet 100 mg  100 mg Oral QHS Arta Silence, MD   100 mg at 03/29/20 2322  . QUEtiapine (SEROQUEL) tablet 50 mg  50 mg Oral TID Arta Silence, MD   50 mg at 03/30/20 1000  . traZODone (DESYREL) tablet 50 mg  50 mg Oral QHS Arta Silence, MD   50 mg at 03/29/20 2321  . trifluoperazine (STELAZINE) tablet 1 mg  1 mg Oral BID Eulas Post, MD   1 mg at 03/30/20 1001   Current Outpatient Medications  Medication Sig Dispense Refill  . acetaminophen (TYLENOL) 500 MG tablet Take 500 mg by mouth 2 (two) times daily.     . Carbidopa-Levodopa ER (SINEMET CR) 25-100 MG tablet controlled release TAKE 2 AND 1/2 TABLETS BY MOUTH EVERY 2 HOURS    . clonazePAM (KLONOPIN) 1 MG tablet Take 1 mg by mouth 4 (four) times daily.    . entacapone (COMTAN) 200 MG tablet Take 200 mg by mouth 6 (six) times daily. With carbidopa    . escitalopram (LEXAPRO) 10 MG tablet Take 10 mg by mouth daily.    . melatonin 5 MG TABS Take 1 tablet (5 mg total) by mouth at bedtime.  0  . neomycin-polymyxin-dexamethasone (MAXITROL) 0.1 % ophthalmic suspension Place 1 drop into the left eye daily.     Marland Kitchen omeprazole (PRILOSEC) 20 MG capsule Take 1 capsule (20 mg total) by mouth daily. 90 capsule 0  . prednisoLONE acetate (PRED FORTE) 1 % ophthalmic suspension Place 1 drop into the left eye 4 (four) times daily.     . pregabalin (LYRICA) 100 MG capsule Take 100 mg by mouth 3 (three) times daily.    . QUEtiapine (SEROQUEL) 100 MG tablet Take 100 mg by mouth  at bedtime.    Marland Kitchen QUEtiapine  (SEROQUEL) 50 MG tablet Take 50 mg by mouth 3 (three) times daily.    . traZODone (DESYREL) 50 MG tablet Take 25-50 mg by mouth at bedtime.      PTA Medications: (Not in a [facility] admission)   Musculoskeletal: Strength & Muscle Tone: within normal limits Gait & Station: did not witness Patient leans: N/A  Psychiatric Specialty Exam: Sarah Exam Vitals and nursing note reviewed.  Constitutional:      Appearance: Normal appearance.  HENT:     Head: Normocephalic.     Nose: Nose normal.  Pulmonary:     Effort: Pulmonary effort is normal.  Musculoskeletal:        General: Normal range of motion.     Cervical back: Normal range of motion.  Neurological:     General: No focal deficit present.     Mental Status: She is alert and oriented to person, place, and time.  Psychiatric:        Attention and Perception: Attention normal.        Mood and Affect: Affect is blunt.        Speech: Speech normal.        Behavior: Behavior normal. Behavior is cooperative.        Thought Content: Thought content normal.        Cognition and Memory: Cognition is impaired. Memory is impaired.        Judgment: Judgment is impulsive.     Review of Systems  Psychiatric/Behavioral: Positive for behavioral problems.  All other systems reviewed and are negative.   Blood pressure 133/86, pulse 80, temperature 97.8 F (36.6 C), temperature source Oral, resp. rate 16, height 5\' 6"  (1.676 m), weight 68 kg, SpO2 92 %.Body mass index is 24.21 kg/m.  General Appearance: Casual  Eye Contact:  Good  Speech:  Normal Rate  Volume:  Normal  Mood:  Euthymic  Affect:  Blunt  Thought Process:  Coherent and Descriptions of Associations: Intact  Orientation:  Full (Time, Place, and Person)  Thought Content:  WDL and Logical  Suicidal Thoughts:  No  Homicidal Thoughts:  No  Memory:  Immediate;   Fair Recent;   Poor Remote;   Fair  Judgement:  Fair  Insight:  Fair  Psychomotor Activity:  Normal   Concentration:  Concentration: Fair and Attention Span: Fair  Recall:  [employer] of Knowledge:  Fair  Language:  Good  Akathisia:  No  Handed:  Right  AIMS (if indicated):     Assets:  Housing Leisure Time Resilience Social Support  ADL's:  Intact  Cognition:  Impaired,  Mild  Sleep:        Demographic Factors:  Age 29 or older and Caucasian  Loss Factors: NA  Historical Factors: Impulsivity  Risk Reduction Factors:   Sense of responsibility to family, Living with another person, especially a relative, Positive social support and Positive therapeutic relationship  Continued Clinical Symptoms:  None  Cognitive Features That Contribute To Risk:  None    Suicide Risk:  Minimal: No identifiable suicidal ideation.  Patients presenting with no risk factors but with morbid ruminations; may be classified as minimal risk based on the severity of the depressive symptoms   Follow-up Information    Cletis Athens, MD.   Specialty: Cardiology Why: As needed Contact information: [address] [place] [address] [phone]  Plan Of Care/Follow-up recommendations:  Dementia related to Parkinson's Disease with behavioral disturbances: -continue Sinemet every 2 hours while awake -continue Comtan 200 mg QID -continue Seroquel 50 mg TID and 100 mg at bedtime -continue stelazine 1 mg BId -continue Klonopin 1 mg QID  Depression: -continue Lexapro 10 mg daily  Insomnia: -continue Trazodone 50 mg at bedtime Activity:  as tolerated Diet:  heart healthy diet  Disposition: discharge home  Waylan Boga, NP 03/30/2020, 10:58 AM

## 2020-03-30 NOTE — BH Assessment (Addendum)
Writer spoke with the patient to complete an updated/reassessment. Patient denies SI/HI and AV/H. Patient was alert and oriented. Have no complaints. Spoke with patient's husband Jenny Reichmann "Jack"[phone]), updated him patient is psychiatrically cleared. Updated ER MD (Dr. Corky Downs).

## 2020-03-30 NOTE — ED Notes (Signed)
Pt sleeping, resps unlabored, call bell at right side.

## 2020-03-30 NOTE — ED Notes (Signed)
Pt up to void, clothes changed, brushed teeth, waiting for husband to arrive to d/c to home.

## 2020-03-30 NOTE — ED Notes (Signed)
Spoke with pts husband, Jenny Reichmann, he will come pick patient up

## 2020-03-30 NOTE — Discharge Instructions (Signed)
Follow up with outpatient neurologist    Parkinson's Disease Parkinson's disease causes problems with movements. It is a long-term condition. It gets worse over time (is progressive). It affects each person in different ways. It makes it harder for you to:  Control how your body moves.  Move your body normally. The condition can range from mild to very bad (advanced). What are the causes? This condition results from a loss of brain cells called neurons. These brain cells make a chemical called dopamine, which is needed to control body movement. As the condition gets worse, the brain cells make less dopamine. This makes it hard to move or control your movements. The exact cause of this condition is not known. What increases the risk?  Being female.  Being age 50 or older.  Having family members who had Parkinson's disease.  Having had an injury to the brain.  Being very sad (depressed).  Being around things that are harmful or poisonous. What are the signs or symptoms? Symptoms of this condition can vary. The main symptoms have to do with movement. These include:  A tremor or shaking while you are resting that you cannot control.  Stiffness in your neck, arms, and legs.  Slowing of movement. This may include: ? Losing expressions of the face. ? Having trouble making small movements that are needed to button your clothing or brush your teeth.  Walking in a way that is not normal. You may walk with short, shuffling steps.  Loss of balance when standing. You may sway, fall backward, or have trouble making turns. Other symptoms include:  Being very sad, worried, or confused.  Seeing or hearing things that are not real.  Losing thinking abilities (dementia).  Trouble speaking or swallowing.  Having a hard time pooping (constipation).  Needing to pee right away, peeing often, or not being able to control when you pee or poop.  Sleep problems. How is this treated? There  is no cure. The goal of treatment is to manage your symptoms. Treatment may include:  Medicines.  Therapy to help with talking or movement.  Surgery to reduce shaking and other movements that you cannot control. Follow these instructions at home: Medicines  Take over-the-counter and prescription medicines only as told by your doctor.  Avoid taking pain or sleeping medicines. Eating and drinking  Follow instructions from your doctor about what you cannot eat or drink.  Do not drink alcohol. Activity  Talk with your doctor about if it is safe for you to drive.  Do exercises as told by your doctor. Lifestyle      Put in grab bars and railings in your home. These help to prevent falls.  Do not use any products that contain nicotine or tobacco, such as cigarettes, e-cigarettes, and chewing tobacco. If you need help quitting, ask your doctor.  Join a support group. General instructions  Talk with your doctor about what you need help with and what your safety needs are.  Keep all follow-up visits as told by your doctor, including any therapy visits to help with talking or moving. This is important. Contact a doctor if:  Medicines do not help your symptoms.  You feel off-balance.  You fall at home.  You need more help at home.  You have trouble swallowing.  You have a very hard time pooping.  You have a lot of side effects from your medicines.  You feel very sad, worried, or confused. Get help right away if:  You  were hurt in a fall.  You see or hear things that are not real.  You cannot swallow without choking.  You have chest pain or trouble breathing.  You do not feel safe at home.  You have thoughts about hurting yourself or others. If you ever feel like you may hurt yourself or others, or have thoughts about taking your own life, get help right away. You can go to your nearest emergency department or call:  Your local emergency services (911 in the  [place]  A suicide crisis helpline, such as the [place] at 513-742-1593. This is open 24 hours a day. Summary  This condition causes problems with movements.  It is a long-term condition. It gets worse over time.  There is no cure. Treatment focuses on managing your symptoms.  Talk with your doctor about what you need help with and what your safety needs are.  Keep all follow-up visits as told by your doctor. This is important. This information is not intended to replace advice given to you by your health care provider. Make sure you discuss any questions you have with your health care provider. Document Revised: 10/13/2018 Document Reviewed: 10/13/2018 Elsevier Patient Education  [place] Disease Parkinson's disease causes problems with movements. It is a long-term condition. It gets worse over time (is progressive). It affects each person in different ways. It makes it harder for you to:  Control how your body moves.  Move your body normally. The condition can range from mild to very bad (advanced). What are the causes? This condition results from a loss of brain cells called neurons. These brain cells make a chemical called dopamine, which is needed to control body movement. As the condition gets worse, the brain cells make less dopamine. This makes it hard to move or control your movements. The exact cause of this condition is not known. What increases the risk?  Being female.  Being age 28 or older.  Having family members who had Parkinson's disease.  Having had an injury to the brain.  Being very sad (depressed).  Being around things that are harmful or poisonous. What are the signs or symptoms? Symptoms of this condition can vary. The main symptoms have to do with movement. These include:  A tremor or shaking while you are resting that you cannot control.  Stiffness in your neck, arms, and legs.  Slowing of  movement. This may include: ? Losing expressions of the face. ? Having trouble making small movements that are needed to button your clothing or brush your teeth.  Walking in a way that is not normal. You may walk with short, shuffling steps.  Loss of balance when standing. You may sway, fall backward, or have trouble making turns. Other symptoms include:  Being very sad, worried, or confused.  Seeing or hearing things that are not real.  Losing thinking abilities (dementia).  Trouble speaking or swallowing.  Having a hard time pooping (constipation).  Needing to pee right away, peeing often, or not being able to control when you pee or poop.  Sleep problems. How is this treated? There is no cure. The goal of treatment is to manage your symptoms. Treatment may include:  Medicines.  Therapy to help with talking or movement.  Surgery to reduce shaking and other movements that you cannot control. Follow these instructions at home: Medicines  Take over-the-counter and prescription medicines only as told by your doctor.  Avoid taking pain  or sleeping medicines. Eating and drinking  Follow instructions from your doctor about what you cannot eat or drink.  Do not drink alcohol. Activity  Talk with your doctor about if it is safe for you to drive.  Do exercises as told by your doctor. Lifestyle      Put in grab bars and railings in your home. These help to prevent falls.  Do not use any products that contain nicotine or tobacco, such as cigarettes, e-cigarettes, and chewing tobacco. If you need help quitting, ask your doctor.  Join a support group. General instructions  Talk with your doctor about what you need help with and what your safety needs are.  Keep all follow-up visits as told by your doctor, including any therapy visits to help with talking or moving. This is important. Contact a doctor if:  Medicines do not help your symptoms.  You feel  off-balance.  You fall at home.  You need more help at home.  You have trouble swallowing.  You have a very hard time pooping.  You have a lot of side effects from your medicines.  You feel very sad, worried, or confused. Get help right away if:  You were hurt in a fall.  You see or hear things that are not real.  You cannot swallow without choking.  You have chest pain or trouble breathing.  You do not feel safe at home.  You have thoughts about hurting yourself or others. If you ever feel like you may hurt yourself or others, or have thoughts about taking your own life, get help right away. You can go to your nearest emergency department or call:  Your local emergency services (911 in the [place]  A suicide crisis helpline, such as the [place] at 463-727-1165. This is open 24 hours a day. Summary  This condition causes problems with movements.  It is a long-term condition. It gets worse over time.  There is no cure. Treatment focuses on managing your symptoms.  Talk with your doctor about what you need help with and what your safety needs are.  Keep all follow-up visits as told by your doctor. This is important. This information is not intended to replace advice given to you by your health care provider. Make sure you discuss any questions you have with your health care provider. Document Revised: 10/13/2018 Document Reviewed: 10/13/2018 Elsevier Patient Education  [place].

## 2020-03-30 NOTE — ED Notes (Signed)
Report to angela, rn. 

## 2020-03-30 NOTE — ED Notes (Signed)
Breakfast tray brought to pt.

## 2020-03-30 NOTE — ED Notes (Signed)
Pt continues to sleep. Call bell at right side.

## 2020-03-30 NOTE — ED Provider Notes (Signed)
Patient seen by physical therapist, psychiatry.  Cleared for outpatient rehab, psychiatry has cleared as well, appropriate for discharge at this time.   Lavonia Drafts, MD 03/30/20 1054

## 2020-04-04 DIAGNOSIS — G20 Parkinson's disease: Secondary | ICD-10-CM | POA: Diagnosis not present

## 2020-04-04 DIAGNOSIS — F0281 Dementia in other diseases classified elsewhere with behavioral disturbance: Secondary | ICD-10-CM | POA: Diagnosis not present

## 2020-04-04 DIAGNOSIS — G8929 Other chronic pain: Secondary | ICD-10-CM | POA: Diagnosis not present

## 2020-04-04 DIAGNOSIS — G629 Polyneuropathy, unspecified: Secondary | ICD-10-CM | POA: Diagnosis not present

## 2020-04-04 DIAGNOSIS — L89312 Pressure ulcer of right buttock, stage 2: Secondary | ICD-10-CM | POA: Diagnosis not present

## 2020-04-04 DIAGNOSIS — M1990 Unspecified osteoarthritis, unspecified site: Secondary | ICD-10-CM | POA: Diagnosis not present

## 2020-04-08 ENCOUNTER — Ambulatory Visit (INDEPENDENT_AMBULATORY_CARE_PROVIDER_SITE_OTHER): Payer: Medicare Other | Admitting: Internal Medicine

## 2020-04-08 ENCOUNTER — Encounter: Payer: Self-pay | Admitting: Internal Medicine

## 2020-04-08 ENCOUNTER — Other Ambulatory Visit: Payer: Self-pay

## 2020-04-08 VITALS — BP 115/76 | HR 72 | Ht 63.0 in

## 2020-04-08 DIAGNOSIS — R410 Disorientation, unspecified: Secondary | ICD-10-CM | POA: Diagnosis not present

## 2020-04-08 DIAGNOSIS — Z0289 Encounter for other administrative examinations: Secondary | ICD-10-CM | POA: Diagnosis not present

## 2020-04-08 DIAGNOSIS — Z0000 Encounter for general adult medical examination without abnormal findings: Secondary | ICD-10-CM

## 2020-04-08 DIAGNOSIS — G20 Parkinson's disease: Secondary | ICD-10-CM

## 2020-04-08 DIAGNOSIS — E876 Hypokalemia: Secondary | ICD-10-CM

## 2020-04-08 DIAGNOSIS — F0281 Dementia in other diseases classified elsewhere with behavioral disturbance: Secondary | ICD-10-CM | POA: Diagnosis not present

## 2020-04-08 DIAGNOSIS — G4752 REM sleep behavior disorder: Secondary | ICD-10-CM

## 2020-04-08 MED ORDER — POTASSIUM CHLORIDE ER 10 MEQ PO TBCR: 10.0000 meq | EXTENDED_RELEASE_TABLET | Freq: Every day | ORAL | 1 refills | Status: DC

## 2020-04-08 NOTE — Assessment & Plan Note (Signed)
Dementia due to parkinsonian disease

## 2020-04-08 NOTE — Assessment & Plan Note (Signed)
She has been slowly getting worse.  She is very restless and moves both hands rapidly sometimes try to get up from the bed even if is not needed.  Patient is not incontinent of stool or urine according to her husband. She yells at her husband and blames him for infidelity. There is no evidence of self injury or inflicted injury by somebody else.  She does not have any bedsores. She is incapable of giving history so most of the information was obtained from the husband.

## 2020-04-08 NOTE — Progress Notes (Signed)
Established Patient Office Visit  SUBJECTIVE:  Subjective  Patient ID: Sarah Donaldson, adult    DOB: 1950-11-23  Age: 69 y.o. MRN: [id number]  CC:  Chief Complaint  Patient presents with  . Annual Exam    HPI Sarah Donaldson is a 69 y.o. adult presenting today for an annual exam.   She is having a lot of anxiety. She is sleeping well at night, but she does have a lot of restlessness at night.   Her husband is working to get her some help at home via hospice.   Past Medical History:  Diagnosis Date  . Cancer ([place])    Non-hodgkin's lymphoma (in remission)  . GERD (gastroesophageal reflux disease)    well-controlled w/ omeprazole  . Hyperlipidemia   . Knee joint cyst, left 02/07/2018  . Parkinson disease ([place])   . Parkinson disease [name])     Past Surgical History:  Procedure Laterality Date  . BACK SURGERY    . BUNIONECTOMY    . NASAL SEPTUM SURGERY    . RETINAL DETACHMENT SURGERY    . TOTAL KNEE ARTHROPLASTY Left 06/07/2019   Procedure: TOTAL KNEE ARTHROPLASTY;  Surgeon: Lovell Sheehan, MD;  Location: [facility] ORS;  Service: Orthopedics;  Laterality: Left;    Family History  Problem Relation Age of Onset  . Heart disease Mother   . Alcohol abuse Father   . Breast cancer Neg Hx   . Mental illness Neg Hx     Social History   Socioeconomic History  . Marital status: Married    Spouse name: Eufelia Veno  . Number of children: 4  . Years of education: Not on file  . Highest education level: Not on file  Occupational History    Comment: retired  Tobacco Use  . Smoking status: Former Research scientist (life sciences)  . Smokeless tobacco: Never Used  Vaping Use  . Vaping Use: Never used  Substance and Sexual Activity  . Alcohol use: Yes    Alcohol/week: 0.0 standard drinks  . Drug use: No  . Sexual activity: Not Currently  Other Topics Concern  . Not on file  Social History Narrative  . Not on file   Social Determinants of Health   Financial Resource Strain:   . Difficulty of  Paying Living Expenses: Not on file  Food Insecurity:   . Worried About Charity fundraiser in the Last Year: Not on file  . Ran Out of Food in the Last Year: Not on file  Transportation Needs:   . Lack of Transportation (Medical): Not on file  . Lack of Transportation (Non-Medical): Not on file  Physical Activity:   . Days of Exercise per Week: Not on file  . Minutes of Exercise per Session: Not on file  Stress:   . Feeling of Stress : Not on file  Social Connections:   . Frequency of Communication with Friends and Family: Not on file  . Frequency of Social Gatherings with Friends and Family: Not on file  . Attends Religious Services: Not on file  . Active Member of Clubs or Organizations: Not on file  . Attends Archivist Meetings: Not on file  . Marital Status: Not on file  Intimate Partner Violence:   . Fear of Current or Ex-Partner: Not on file  . Emotionally Abused: Not on file  . Physically Abused: Not on file  . Sexually Abused: Not on file     Current Outpatient Medications:  .  acetaminophen (TYLENOL)  500 MG tablet, Take 500 mg by mouth 2 (two) times daily. , Disp: , Rfl:  .  Carbidopa-Levodopa ER (SINEMET CR) 25-100 MG tablet controlled release, TAKE 2 AND 1/2 TABLETS BY MOUTH EVERY 2 HOURS, Disp: , Rfl:  .  entacapone (COMTAN) 200 MG tablet, Take 1 tablet (200 mg total) by mouth 4 (four) times daily., Disp: 120 tablet, Rfl: 0 .  escitalopram (LEXAPRO) 10 MG tablet, Take 10 mg by mouth daily., Disp: , Rfl:  .  melatonin 5 MG TABS, Take 1 tablet (5 mg total) by mouth at bedtime., Disp: , Rfl: 0 .  neomycin-polymyxin-dexamethasone (MAXITROL) 0.1 % ophthalmic suspension, Place 1 drop into the left eye daily. , Disp: , Rfl:  .  omeprazole (PRILOSEC) 20 MG capsule, Take 1 capsule (20 mg total) by mouth daily., Disp: 90 capsule, Rfl: 0 .  pantoprazole (PROTONIX) 20 MG tablet, Take 1 tablet (20 mg total) by mouth daily., Disp: 30 tablet, Rfl: 1 .  prednisoLONE  acetate (PRED FORTE) 1 % ophthalmic suspension, Place 1 drop into the left eye 4 (four) times daily. , Disp: , Rfl:  .  pregabalin (LYRICA) 100 MG capsule, Take 100 mg by mouth 3 (three) times daily., Disp: , Rfl:  .  QUEtiapine (SEROQUEL) 100 MG tablet, Take 100 mg by mouth at bedtime., Disp: , Rfl:  .  QUEtiapine (SEROQUEL) 50 MG tablet, Take 50 mg by mouth 3 (three) times daily., Disp: , Rfl:  .  traZODone (DESYREL) 50 MG tablet, Take 25-50 mg by mouth at bedtime. , Disp: , Rfl:  .  trifluoperazine (STELAZINE) 1 MG tablet, Take 1 tablet (1 mg total) by mouth 2 (two) times daily., Disp: 30 tablet, Rfl: 1   Allergies  Allergen Reactions  . Percocet [Oxycodone-Acetaminophen]     Hallucination  . Baclofen     Caused numbness in mouth   . Codeine Sulfate Nausea And Vomiting  . Gabapentin Swelling    Mouth swelling   . Terazosin   . Tramadol Hcl     Interferes with Serotonin levels which affects her Parkinsons    ROS Review of Systems  Unable to perform ROS: Dementia (Information obtained from pt's husband)  Constitutional: Negative.   HENT: Negative.   Eyes: Negative.   Respiratory: Negative.   Cardiovascular: Negative.   Gastrointestinal: Negative.   Endocrine: Negative.   Genitourinary: Negative.   Musculoskeletal: Negative.   Skin: Negative.   Allergic/Immunologic: Negative.   Neurological: Negative.   Hematological: Negative.   Psychiatric/Behavioral: Positive for sleep disturbance. Negative for agitation, behavioral problems and hallucinations. The patient is nervous/anxious.        Pt is restless and appears that she is not aware of her surroundings. Most of the history was obtained for the husband because she is unable to answer in an inteligible way due to her dementia.   All other systems reviewed and are negative.    OBJECTIVE:    Physical Exam Vitals reviewed.  Constitutional:      Appearance: Normal appearance.  HENT:     Mouth/Throat:     Mouth: Mucous  membranes are moist.  Eyes:     Pupils: Pupils are equal, round, and reactive to light.  Neck:     Vascular: No carotid bruit.  Cardiovascular:     Rate and Rhythm: Normal rate and regular rhythm.     Pulses: Normal pulses.     Heart sounds: Normal heart sounds.  Pulmonary:     Effort: Pulmonary effort is  normal.     Breath sounds: Normal breath sounds.  Abdominal:     General: Bowel sounds are normal.     Palpations: Abdomen is soft. There is no hepatomegaly, splenomegaly or mass.     Tenderness: There is no abdominal tenderness.     Hernia: No hernia is present.  Musculoskeletal:        General: No tenderness.     Cervical back: Neck supple.     Right lower leg: No edema.     Left lower leg: No edema.  Skin:    Findings: No rash.  Neurological:     Mental Status: She is alert. She is disoriented.     Motor: Weakness and atrophy present. No seizure activity.     Coordination: Coordination abnormal.     Comments: Parkinson's  Psychiatric:        Mood and Affect: Mood is anxious.        Speech: Speech is rapid and pressured.        Behavior: Behavior is not agitated, aggressive or combative.        Thought Content: Thought content does not include suicidal ideation.        Cognition and Memory: Cognition is impaired. Memory is impaired.        Judgment: Judgment is inappropriate.     BP 115/76   Pulse 72   Ht 5\' 3"  (1.6 m)   BMI 26.57 kg/m  Wt Readings from Last 3 Encounters:  03/28/20 150 lb (68 kg)  02/16/20 145 lb 1 oz (65.8 kg)  01/08/20 145 lb (65.8 kg)    Health Maintenance Due  Topic Date Due  . Hepatitis C Screening  Never done  . TETANUS/TDAP  Never done  . COLONOSCOPY  Never done  . DEXA SCAN  Never done  . PNA vac Low Risk Adult (1 of 2 - PCV13) Never done  . INFLUENZA VACCINE  03/10/2020    There are no preventive care reminders to display for this patient.  CBC Latest Ref Rng & Units 03/27/2020 03/14/2020 02/16/2020  WBC 4.0 - 10.5 K/uL 5.6 9.9  5.1  Hemoglobin 12.0 - 15.0 g/dL 11.2(L) 12.4 12.7  Hematocrit 36 - 46 % 36.0 38.5 39.8  Platelets 150 - 400 K/uL 195 210 143(L)   CMP Latest Ref Rng & Units 03/27/2020 03/14/2020 02/16/2020  Glucose 70 - 99 mg/dL 104(H) 115(H) 102(H)  BUN 8 - 23 mg/dL 21 24(H) 29(H)  Creatinine 0.44 - 1.00 mg/dL 0.40(L) 0.80 0.44  Sodium 135 - 145 mmol/L 143 144 143  Potassium 3.5 - 5.1 mmol/L 3.4(L) 3.6 3.3(L)  Chloride 98 - 111 mmol/L 107 107 107  CO2 22 - 32 mmol/L 26 27 29   Calcium 8.9 - 10.3 mg/dL 8.6(L) 8.5(L) 8.9  Total Protein 6.5 - 8.1 g/dL 5.9(L) - 6.2(L)  Total Bilirubin 0.3 - 1.2 mg/dL 0.9 - 0.9  Alkaline Phos 38 - 126 U/L 104 - 99  AST 15 - 41 U/L 10(L) - 11(L)  ALT 0 - 44 U/L <5 - <5    Lab Results  Component Value Date   TSH 1.778 09/24/2019   Lab Results  Component Value Date   ALBUMIN 3.7 03/27/2020   ANIONGAP 10 03/27/2020   No results found for: CHOL, HDL, LDLCALC, CHOLHDL No results found for: TRIG No results found for: HGBA1C    ASSESSMENT & PLAN:   Problem List Items Addressed This Visit    None      No  orders of the defined types were placed in this encounter.   Follow-up: No follow-ups on file.    Dr. Jane Canary [facility] [address], [place], [place] [address]   By signing my name below, I, [employer], attest that this documentation has been prepared under the direction and in the presence of Cletis Athens, MD. Electronically Signed: Franchot Erichsen 04/08/20, 3:07 PM   I personally performed the services described in this documentation, which was SCRIBED in my presence. The recorded information has been reviewed and considered accurate. It has been edited as necessary during review. Cletis Athens, MD

## 2020-04-08 NOTE — Assessment & Plan Note (Signed)
She takes trazodone for that.

## 2020-04-08 NOTE — Assessment & Plan Note (Signed)
On physical examination her heart is regular chest is clear there is no pedal edema abdomen is soft.  Neurological examination as described above.

## 2020-04-10 ENCOUNTER — Emergency Department
Admission: EM | Admit: 2020-04-10 | Discharge: 2020-04-12 | Disposition: A | Discharge status: Home / Self Care | Payer: Medicare Other | Attending: Emergency Medicine | Admitting: Emergency Medicine

## 2020-04-10 ENCOUNTER — Other Ambulatory Visit: Payer: Self-pay

## 2020-04-10 DIAGNOSIS — Z96652 Presence of left artificial knee joint: Secondary | ICD-10-CM | POA: Insufficient documentation

## 2020-04-10 DIAGNOSIS — G20 Parkinson's disease: Secondary | ICD-10-CM | POA: Diagnosis not present

## 2020-04-10 DIAGNOSIS — E162 Hypoglycemia, unspecified: Secondary | ICD-10-CM | POA: Diagnosis not present

## 2020-04-10 DIAGNOSIS — F0390 Unspecified dementia without behavioral disturbance: Secondary | ICD-10-CM | POA: Insufficient documentation

## 2020-04-10 DIAGNOSIS — Z87891 Personal history of nicotine dependence: Secondary | ICD-10-CM | POA: Diagnosis not present

## 2020-04-10 DIAGNOSIS — R4182 Altered mental status, unspecified: Secondary | ICD-10-CM | POA: Diagnosis not present

## 2020-04-10 DIAGNOSIS — Z8572 Personal history of non-Hodgkin lymphomas: Secondary | ICD-10-CM | POA: Insufficient documentation

## 2020-04-10 DIAGNOSIS — F29 Unspecified psychosis not due to a substance or known physiological condition: Secondary | ICD-10-CM | POA: Diagnosis not present

## 2020-04-10 DIAGNOSIS — Z20822 Contact with and (suspected) exposure to covid-19: Secondary | ICD-10-CM | POA: Insufficient documentation

## 2020-04-10 DIAGNOSIS — Z79899 Other long term (current) drug therapy: Secondary | ICD-10-CM | POA: Diagnosis not present

## 2020-04-10 DIAGNOSIS — K449 Diaphragmatic hernia without obstruction or gangrene: Secondary | ICD-10-CM | POA: Diagnosis not present

## 2020-04-10 DIAGNOSIS — F0281 Dementia in other diseases classified elsewhere with behavioral disturbance: Secondary | ICD-10-CM | POA: Diagnosis not present

## 2020-04-10 DIAGNOSIS — Z86010 Personal history of colonic polyps: Secondary | ICD-10-CM | POA: Insufficient documentation

## 2020-04-10 DIAGNOSIS — Z86018 Personal history of other benign neoplasm: Secondary | ICD-10-CM | POA: Diagnosis not present

## 2020-04-10 DIAGNOSIS — F0391 Unspecified dementia with behavioral disturbance: Secondary | ICD-10-CM

## 2020-04-10 DIAGNOSIS — R404 Transient alteration of awareness: Secondary | ICD-10-CM | POA: Diagnosis not present

## 2020-04-10 DIAGNOSIS — F0280 Dementia in other diseases classified elsewhere without behavioral disturbance: Secondary | ICD-10-CM | POA: Diagnosis present

## 2020-04-10 DIAGNOSIS — E161 Other hypoglycemia: Secondary | ICD-10-CM | POA: Diagnosis not present

## 2020-04-10 LAB — CBC
HCT: 36.2 % (ref 36.0–46.0)
Hemoglobin: 11.6 g/dL — ABNORMAL LOW (ref 12.0–15.0)
MCH: 28.2 pg (ref 26.0–34.0)
MCHC: 32 g/dL (ref 30.0–36.0)
MCV: 87.9 fL (ref 80.0–100.0)
Platelets: 174 10*3/uL (ref 150–400)
RBC: 4.12 MIL/uL (ref 3.87–5.11)
RDW: 13.8 % (ref 11.5–15.5)
WBC: 7.4 10*3/uL (ref 4.0–10.5)
nRBC: 0 % (ref 0.0–0.2)

## 2020-04-10 LAB — SALICYLATE LEVEL: Salicylate Lvl: <7 mg/dL — ABNORMAL LOW (ref 7.0–30.0)

## 2020-04-10 LAB — ETHANOL: Alcohol, Ethyl (B): <10 mg/dL (ref <10)

## 2020-04-10 LAB — ACETAMINOPHEN LEVEL: Acetaminophen (Tylenol), Serum: <10 ug/mL — ABNORMAL LOW (ref 10–30)

## 2020-04-10 NOTE — ED Notes (Signed)
Pt continuing to yell out cursing and telling staff "it's not true, it's not true" pt has been asked multiple times to calm down.

## 2020-04-10 NOTE — ED Notes (Signed)
Pt is incoherent, poor historian. Pt denies SIB, SI, HI, AVH. She presents with a flat affect.

## 2020-04-10 NOTE — ED Notes (Signed)
Pt laying on t

## 2020-04-10 NOTE — ED Triage Notes (Signed)
Pt comes EMS from home with husband stating increased agitation, thrashing about, refusing medications for about 3 days. Xanax added to meds recently. Pt yelling nonsense, minorly cooperative. VSS. EKG normal with EMS.

## 2020-04-11 ENCOUNTER — Emergency Department: Payer: Medicare Other

## 2020-04-11 DIAGNOSIS — F0281 Dementia in other diseases classified elsewhere with behavioral disturbance: Secondary | ICD-10-CM

## 2020-04-11 DIAGNOSIS — G20 Parkinson's disease: Secondary | ICD-10-CM

## 2020-04-11 DIAGNOSIS — K449 Diaphragmatic hernia without obstruction or gangrene: Secondary | ICD-10-CM | POA: Diagnosis not present

## 2020-04-11 DIAGNOSIS — F0390 Unspecified dementia without behavioral disturbance: Secondary | ICD-10-CM | POA: Diagnosis not present

## 2020-04-11 LAB — COMPREHENSIVE METABOLIC PANEL
ALT: <5 U/L (ref 0–44)
AST: 13 U/L — ABNORMAL LOW (ref 15–41)
Albumin: 3.9 g/dL (ref 3.5–5.0)
Alkaline Phosphatase: 89 U/L (ref 38–126)
Anion gap: 11 (ref 5–15)
BUN: 23 mg/dL (ref 8–23)
CO2: 24 mmol/L (ref 22–32)
Calcium: 8.7 mg/dL — ABNORMAL LOW (ref 8.9–10.3)
Chloride: 105 mmol/L (ref 98–111)
Creatinine, Ser: 0.58 mg/dL (ref 0.44–1.00)
GFR calc Af Amer: >60 mL/min (ref >60)
GFR calc non Af Amer: >60 mL/min (ref >60)
Glucose, Bld: 102 mg/dL — ABNORMAL HIGH (ref 70–99)
Potassium: 3.4 mmol/L — ABNORMAL LOW (ref 3.5–5.1)
Sodium: 140 mmol/L (ref 135–145)
Total Bilirubin: 1.4 mg/dL — ABNORMAL HIGH (ref 0.3–1.2)
Total Protein: 6.1 g/dL — ABNORMAL LOW (ref 6.5–8.1)

## 2020-04-11 LAB — URINE DRUG SCREEN, QUALITATIVE (ARMC ONLY)
Amphetamines, Ur Screen: NOT DETECTED
Barbiturates, Ur Screen: NOT DETECTED
Benzodiazepine, Ur Scrn: POSITIVE — AB
Cannabinoid 50 Ng, Ur ~~LOC~~: NOT DETECTED
Cocaine Metabolite,Ur ~~LOC~~: NOT DETECTED
MDMA (Ecstasy)Ur Screen: NOT DETECTED
Methadone Scn, Ur: NOT DETECTED
Opiate, Ur Screen: NOT DETECTED
Phencyclidine (PCP) Ur S: NOT DETECTED
Tricyclic, Ur Screen: POSITIVE — AB

## 2020-04-11 LAB — SARS CORONAVIRUS 2 BY RT PCR (HOSPITAL ORDER, PERFORMED IN ~~LOC~~ HOSPITAL LAB): SARS Coronavirus 2: NEGATIVE

## 2020-04-11 MED ORDER — QUETIAPINE FUMARATE 25 MG PO TABS
12.5000 mg | ORAL_TABLET | Freq: Two times a day (BID) | ORAL | Status: DC
  Administered 2020-04-11 – 2020-04-12 (×3): 12.5 mg via ORAL
  Filled 2020-04-11 (×3): qty 1

## 2020-04-11 MED ORDER — ESCITALOPRAM OXALATE 10 MG PO TABS
10.0000 mg | ORAL_TABLET | Freq: Once | ORAL | Status: AC
  Administered 2020-04-11: 10 mg via ORAL
  Filled 2020-04-11: qty 1

## 2020-04-11 MED ORDER — ESCITALOPRAM OXALATE 10 MG PO TABS
10.0000 mg | ORAL_TABLET | Freq: Every day | ORAL | Status: DC
  Administered 2020-04-12: 10 mg via ORAL
  Filled 2020-04-11: qty 1

## 2020-04-11 MED ORDER — CARBIDOPA-LEVODOPA 25-100 MG PO TABS
1.0000 | ORAL_TABLET | Freq: Three times a day (TID) | ORAL | Status: DC
  Administered 2020-04-11 – 2020-04-12 (×4): 1 via ORAL
  Filled 2020-04-11 (×7): qty 1

## 2020-04-11 MED ORDER — TRIFLUOPERAZINE HCL 2 MG PO TABS
1.0000 mg | ORAL_TABLET | Freq: Once | ORAL | Status: DC
  Filled 2020-04-11: qty 1

## 2020-04-11 MED ORDER — QUETIAPINE FUMARATE 25 MG PO TABS
100.0000 mg | ORAL_TABLET | Freq: Every day | ORAL | Status: DC
  Administered 2020-04-11 (×2): 100 mg via ORAL
  Filled 2020-04-11 (×2): qty 4

## 2020-04-11 MED ORDER — ENTACAPONE 200 MG PO TABS
200.0000 mg | ORAL_TABLET | Freq: Four times a day (QID) | ORAL | Status: DC
  Administered 2020-04-11 – 2020-04-12 (×4): 200 mg via ORAL
  Filled 2020-04-11 (×6): qty 1

## 2020-04-11 MED ORDER — POTASSIUM CHLORIDE ER 10 MEQ PO TBCR
10.0000 meq | EXTENDED_RELEASE_TABLET | Freq: Every day | ORAL | Status: DC
  Administered 2020-04-11: 10 meq via ORAL
  Filled 2020-04-11 (×2): qty 1

## 2020-04-11 MED ORDER — TRAZODONE HCL 50 MG PO TABS
50.0000 mg | ORAL_TABLET | Freq: Every day | ORAL | Status: DC
  Administered 2020-04-11 (×2): 50 mg via ORAL
  Filled 2020-04-11 (×3): qty 1

## 2020-04-11 MED ORDER — PANTOPRAZOLE SODIUM 20 MG PO TBEC
20.0000 mg | DELAYED_RELEASE_TABLET | Freq: Every day | ORAL | Status: DC
  Administered 2020-04-11: 20 mg via ORAL
  Filled 2020-04-11 (×2): qty 1

## 2020-04-11 MED ORDER — CLONAZEPAM 0.5 MG PO TABS
1.0000 mg | ORAL_TABLET | Freq: Three times a day (TID) | ORAL | Status: DC
  Administered 2020-04-11 – 2020-04-12 (×4): 1 mg via ORAL
  Filled 2020-04-11 (×4): qty 2

## 2020-04-11 NOTE — ED Notes (Signed)
Hourly rounding reveals patient in room. No complaints, stable, in no acute distress. Q15 minute rounds and monitoring via Security Cameras to continue. 

## 2020-04-11 NOTE — ED Provider Notes (Signed)
[facility] Emergency Department Provider Note  ____________________________________________   First MD Initiated Contact with Patient 04/10/20 2321     (approximate)  I have reviewed the triage vital signs and the nursing notes.  Level 5 caveat history review of system limited secondary to dementia HISTORY  Chief Complaint Psychiatric Evaluation    HPI Sarah Donaldson is a 69 y.o. female with below list of previous medical conditions including dementia with mood disorder, Parkinson's presents to the emergency departmentWith her husband who states that she has had increased agitation, thrashing about refusing to take medications over the past 3 days.  Patient's husband states that Xanax was recently added to her medication list.  Patient yelling nonsensical words on arrival.  However at present patient resting comfortably answering questions.  Patient agreed to take her medications.        Past Medical History:  Diagnosis Date  . Cancer ([place])    Non-hodgkin's lymphoma (in remission)  . GERD (gastroesophageal reflux disease)    well-controlled w/ omeprazole  . Hyperlipidemia   . Knee joint cyst, left 02/07/2018  . Parkinson disease ([place])   . Parkinson disease [facility])     Patient Active Problem List   Diagnosis Date Noted  . Encounter for annual health check of caregiver 04/08/2020  . Altered mental status   . Dementia due to Parkinson's disease with behavioral disturbance ([place])   . SDH (subdural hematoma) ([place]) 12/11/2019  . Falls frequently 12/11/2019  . S/P total knee replacement, left 06/07/2019  . Osteoarthritis of knee 04/27/2019  . Panic attacks 04/07/2019  . Psychosis ([place]) 04/04/2019  . Numbness 03/09/2018  . Lumbar spondylosis 03/07/2018  . Chronic pain of left knee 03/07/2018  . Chronic upper back pain 12/08/2017  . Benign neoplasm of hand 10/20/2017  . Chronic pain syndrome 10/20/2017  . Chronic pain of right lower extremity  10/20/2017  . Chronic myofascial pain 10/20/2017  . Numbness and tingling 07/12/2017  . Sleep difficulties 07/12/2017  . Low back pain 01/18/2017  . Sleep behavior disorder, REM 01/18/2017  . Lumbar radiculopathy 11/01/2016  . Follicular lymphoma of extranodal and solid organ sites [facility]) 05/01/2016  . Lymphoma ([place]) 04/30/2016  . Hallux limitus 07/27/2015  . Low back pain with sciatica 02/13/2015  . Bilateral low back pain with sciatica 02/13/2015  . H/O adenomatous polyp of colon 04/06/2014  . Hx of adenomatous colonic polyps 04/06/2014  . Back ache 01/25/2014  . Adiposity 01/25/2014  . REM sleep behavior disorder 01/25/2014  . Disordered sleep 01/25/2014  . Has a tremor 01/25/2014  . Obesity, unspecified 01/25/2014  . Sleep disorder 01/25/2014  . Backache 01/25/2014  . Tremor 01/25/2014  . Obesity 01/25/2014  . Difficulty hearing 06/08/2012  . Hearing loss 06/08/2012  . Anxiety disorder 06/06/2012  . Colon polyp 06/06/2012  . Clinical depression 06/06/2012  . Hypercholesteremia 06/06/2012  . Idiopathic Parkinson's disease ([place]) 06/06/2012  . Detached retina 06/06/2012  . Depressive disorder 06/06/2012  . Retinal detachment 06/06/2012  . Parkinson's disease ([place]) 06/06/2012    Past Surgical History:  Procedure Laterality Date  . BACK SURGERY    . BUNIONECTOMY    . NASAL SEPTUM SURGERY    . RETINAL DETACHMENT SURGERY    . TOTAL KNEE ARTHROPLASTY Left 06/07/2019   Procedure: TOTAL KNEE ARTHROPLASTY;  Surgeon: Lovell Sheehan, MD;  Location: [facility] ORS;  Service: Orthopedics;  Laterality: Left;    Prior to Admission medications   Medication Sig Start Date End Date  Taking? Authorizing Provider  acetaminophen (TYLENOL) 500 MG tablet Take 500 mg by mouth 2 (two) times daily.    Yes [provider]  ALPRAZolam (XANAX) 0.25 MG tablet Take 0.25 mg by mouth 2 (two) times daily as needed. 04/01/20  Yes [provider]  Carbidopa-Levodopa ER (SINEMET CR) 25-100  MG tablet controlled release TAKE 2 EVERY 2 HOURS TAKE AN EXTRA 1-2 TABLETS AT NIGHT FOR CHEST TIGHTNESS   Yes [provider]  clonazePAM (KLONOPIN) 2 MG tablet Take 1 mg by mouth 4 (four) times daily. 04/03/20  Yes [provider]  entacapone (COMTAN) 200 MG tablet Take 1 tablet (200 mg total) by mouth 4 (four) times daily. 03/30/20  Yes Patrecia Pour, NP  escitalopram (LEXAPRO) 10 MG tablet Take 10 mg by mouth daily.   Yes [provider]  melatonin 5 MG TABS Take 1 tablet (5 mg total) by mouth at bedtime. 12/13/19  Yes Ezekiel Slocumb, DO  neomycin-polymyxin-dexamethasone (MAXITROL) 0.1 % ophthalmic suspension Place 1 drop into the left eye daily.    Yes [provider]  omeprazole (PRILOSEC) 20 MG capsule Take 1 capsule (20 mg total) by mouth daily. 03/07/18 11/22/24 Yes King, Diona Foley, NP  pantoprazole (PROTONIX) 20 MG tablet Take 1 tablet (20 mg total) by mouth daily. 03/31/20  Yes Lord, Asa Saunas, NP  potassium chloride (KLOR-CON) 10 MEQ tablet Take 1 tablet (10 mEq total) by mouth daily. 04/08/20  Yes Masoud, Viann Shove, MD  prednisoLONE acetate (PRED FORTE) 1 % ophthalmic suspension Place 1 drop into the left eye 4 (four) times daily.    Yes [provider]  pregabalin (LYRICA) 100 MG capsule Take 100 mg by mouth 3 (three) times daily. 11/18/19  Yes [provider]  QUEtiapine (SEROQUEL) 100 MG tablet Take 100 mg by mouth at bedtime.   Yes [provider]  QUEtiapine (SEROQUEL) 50 MG tablet Take 50 mg by mouth 3 (three) times daily.   Yes [provider]  traZODone (DESYREL) 50 MG tablet Take 25-50 mg by mouth at bedtime.  10/04/18  Yes [provider]  trifluoperazine (STELAZINE) 1 MG tablet Take 1 tablet (1 mg total) by mouth 2 (two) times daily. 03/30/20  Yes Patrecia Pour, NP  HYDROcodone-acetaminophen (NORCO/VICODIN) 5-325 MG tablet Take by mouth. Patient not taking: Reported on 04/10/2020 04/02/20   [provider]    Allergies Percocet [oxycodone-acetaminophen], Baclofen, Codeine sulfate, Gabapentin, Terazosin, and Tramadol hcl  Family History  Problem Relation Age of Onset  . Heart disease Mother   . Alcohol abuse Father   . Breast cancer Neg Hx   . Mental illness Neg Hx     Social History Social History   Tobacco Use  . Smoking status: Former Research scientist (life sciences)  . Smokeless tobacco: Never Used  Vaping Use  . Vaping Use: Never used  Substance Use Topics  . Alcohol use: Yes    Alcohol/week: 0.0 standard drinks  . Drug use: No    Review of Systems Constitutional: No fever/chills Eyes: No visual changes. ENT: No sore throat. Cardiovascular: Denies chest pain. Respiratory: Denies shortness of breath. Gastrointestinal: No abdominal pain.  No nausea, no vomiting.  No diarrhea.  No constipation. Genitourinary: Negative for dysuria. Musculoskeletal: Negative for neck pain.  Negative for back pain. Integumentary: Negative for rash. Neurological: Negative for headaches, focal weakness or numbness. Psychiatric:    ____________________________________________   PHYSICAL EXAM:  VITAL SIGNS: ED Triage Vitals  Enc Vitals Group  BP 04/10/20 2230 107/60     Pulse Rate 04/10/20 2230 95     Resp 04/10/20 2230 18     Temp 04/10/20 2230 98.6 F (37 C)     Temp Source 04/10/20 2230 Oral     SpO2 04/10/20 2230 97 %     Weight 04/10/20 2229 68 kg (149 lb 14.6 oz)     Height 04/10/20 2229 1.6 m (5\' 3" )     Head Circumference --      Peak Flow --      Pain Score 04/10/20 2228 0     Pain Loc --      Pain Edu? --      Excl. in [place]? --     Constitutional: Alert, no apparent distress Eyes: Conjunctivae are normal.  Head: Atraumatic. Mouth/Throat: Patient is wearing a mask. Neck: No stridor.  No meningeal signs.   Cardiovascular: Normal rate, regular rhythm. Good peripheral circulation. Grossly normal heart sounds. Respiratory: Normal respiratory effort.  No  retractions. Gastrointestinal: Soft and nontender. No distention.  Musculoskeletal: No lower extremity tenderness nor edema. No gross deformities of extremities. Neurologic: No gross focal neurologic deficits are appreciated.  Skin:  Skin is warm, dry and intact. Psychiatric: Resting comfortably no agitation at present. ____________________________________________   LABS (all labs ordered are listed, but only abnormal results are displayed)  Labs Reviewed  COMPREHENSIVE METABOLIC PANEL - Abnormal; Notable for the following components:      Result Value   Potassium 3.4 (*)    Glucose, Bld 102 (*)    Calcium 8.7 (*)    Total Protein 6.1 (*)    AST 13 (*)    Total Bilirubin 1.4 (*)    All other components within normal limits  SALICYLATE LEVEL - Abnormal; Notable for the following components:   Salicylate Lvl <7.6 (*)    All other components within normal limits  ACETAMINOPHEN LEVEL - Abnormal; Notable for the following components:   Acetaminophen (Tylenol), Serum <10 (*)    All other components within normal limits  CBC - Abnormal; Notable for the following components:   Hemoglobin 11.6 (*)    All other components within normal limits  ETHANOL  URINE DRUG SCREEN, QUALITATIVE ([facility] ONLY)     Procedures   ____________________________________________   INITIAL IMPRESSION / MDM / ASSESSMENT AND PLAN / ED COURSE  As part of my medical decision making, I reviewed the following data within the electronic MEDICAL RECORD NUMBER  69 year old female presented with above-stated history and physical exam.  Patient agreed to take her medications when I asked her to in the emergency department.  She was given her nighttime meds.  Psychiatry consultation disposition pending. ____________________________________________  FINAL CLINICAL IMPRESSION(S) / ED DIAGNOSES  Final diagnoses:  Dementia with behavioral disturbance, unspecified dementia type ([place])     MEDICATIONS GIVEN DURING THIS  VISIT:  Medications - No data to display   ED Discharge Orders    None      *Please note:  Fletcher A Topham was evaluated in Emergency Department on 04/11/2020 for the symptoms described in the history of present illness. She was evaluated in the context of the global COVID-19 pandemic, which necessitated consideration that the patient might be at risk for infection with the SARS-CoV-2 virus that causes COVID-19. Institutional protocols and algorithms that pertain to the evaluation of patients at risk for COVID-19 are in a state of rapid change based on information released by regulatory bodies including the CDC and federal and  state organizations. These policies and algorithms were followed during the patient's care in the ED.  Some ED evaluations and interventions may be delayed as a result of limited staffing during and after the pandemic.*  Note:  This document was prepared using Dragon voice recognition software and may include unintentional dictation errors.   Gregor Hams, MD 04/11/20 [phone]
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
## 2020-04-11 NOTE — ED Notes (Signed)
VOl, psych consult complete, pend placement

## 2020-04-11 NOTE — ED Notes (Signed)
Report to include Situation, Background, Assessment, and Recommendations received from [facility]. Patient alert and oriented, warm and dry, in no acute distress. Unable to assess SI, HI, AVH and pain. Patient made aware of Q15 minute rounds and Engineer, drilling presence for their safety. Patient instructed to come to me with needs or concerns.

## 2020-04-11 NOTE — ED Notes (Signed)
Hourly rounding reveals patient in room. No complaints, stable, in no acute distress. Q15 minute rounds and monitoring via Rover and Officer to continue.   

## 2020-04-11 NOTE — BH Assessment (Signed)
Assessment Note  Sarah Donaldson is an 69 y.o. female. Per triage note: Pt comes EMS from home with husband stating increased agitation, thrashing about, refusing medications for about 3 days. Xanax added to meds recently. Pt yelling nonsense, minorly cooperative. VSS. EKG normal with EMS.   Upon this writer's arrival patient was awake but not oriented. Patient was calm and did not appear agitated. Pt was disheveled and her speech was incoherent as she had no voice. The patient attempted to ramble; however, she was unable to effectively communicate. The patient was seemingly disorganized.   Collateral contact Ahsha Hinsley (Husband) [phone]: Jenny Reichmann reported that the patient has been significantly agitated for the last 3 days and seemingly out of her mind. [name] explained that the patient was screaming belligerently which is why she'd lost her voice. [name] reported that the patient attempts to run out of the house and is not receptive to redirection. [name] explained that her behavior is due to her Parkinson's diagnosis and feels that the patient is just declining. [name] reported that the patient escalates when she's told not to do things.   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 Cancer ([place])    Non-hodgkin's lymphoma (in remission)  . GERD (gastroesophageal reflux disease)    well-controlled w/ omeprazole  . Hyperlipidemia   . Knee joint cyst, left 02/07/2018  . Parkinson disease ([place])   . Parkinson disease [facility])     Past Surgical History:  Procedure Laterality Date  . BACK SURGERY    . BUNIONECTOMY    . NASAL SEPTUM SURGERY    . RETINAL DETACHMENT SURGERY    . TOTAL KNEE ARTHROPLASTY Left 06/07/2019   Procedure: TOTAL KNEE ARTHROPLASTY;  Surgeon: Lovell Sheehan, MD;  Location: [facility] ORS;  Service: Orthopedics;  Laterality: Left;    Family History:  Family History   Problem Relation Age of Onset  . Heart disease Mother   . Alcohol abuse Father   . Breast cancer Neg Hx   . Mental illness Neg Hx     Social History:  reports that she has quit smoking. She has never used smokeless tobacco. She reports current alcohol use. She reports that she does not use drugs.  Additional Social History:  Alcohol / Drug Use Pain Medications: See PTA Prescriptions: See PTA History of alcohol / drug use?: No history of alcohol / drug abuse  CIWA: CIWA-Ar BP: 107/60 Pulse Rate: 95 COWS:    Allergies:  Allergies  Allergen Reactions  . Percocet [Oxycodone-Acetaminophen]     Hallucination  . Baclofen     Caused numbness in mouth   . Codeine Sulfate Nausea And Vomiting  . Gabapentin Swelling    Mouth swelling   . Terazosin   . Tramadol Hcl     Interferes with Serotonin levels which affects her Parkinsons    Home Medications: (Not in a hospital admission)   OB/GYN Status:  No LMP recorded. Patient is postmenopausal.  General Assessment Data Assessment unable to be completed: Yes Reason for Not Completing Assessment: Patient's speech is incoherent as she had no voice.  Admission Status: Involuntary                    Mental Status Report Motor Activity: Freedom of movement     ADLScreening [facility] Assessment Services) Patient's cognitive ability adequate to safely complete daily activities?: Yes Patient able to express need  for assistance with ADLs?: Yes Independently performs ADLs?: Yes (appropriate for developmental age)        ADL Screening (condition at time of admission) Patient's cognitive ability adequate to safely complete daily activities?: Yes Is the patient deaf or have difficulty hearing?: No Does the patient have difficulty seeing, even when wearing glasses/contacts?: No Does the patient have difficulty concentrating, remembering, or making decisions?: No Patient able to express need for assistance with ADLs?: Yes Does  the patient have difficulty dressing or bathing?: No Independently performs ADLs?: Yes (appropriate for developmental age) Does the patient have difficulty walking or climbing stairs?: No Weakness of Legs: None Weakness of Arms/Hands: None  Home Assistive Devices/Equipment Home Assistive Devices/Equipment: None  Therapy Consults (therapy consults require a physician order) OT Evalulation Needed: No SLP Evaluation Needed: No Abuse/Neglect Assessment (Assessment to be complete while patient is alone) Abuse/Neglect Assessment Can Be Completed: Unable to assess, patient is non-responsive or altered mental status Values / Beliefs Cultural Requests During Hospitalization: None Spiritual Requests During Hospitalization: None Consults Spiritual Care Consult Needed: No Transition of Care Team Consult Needed: No Advance Directives (For Healthcare) Does Patient Have a Medical Advance Directive?: No          Disposition: Per NP Jamie L, pt is recommended for inpatient treatment.     On Site Evaluation by:   Reviewed with Physician:    Kathi Ludwig 04/11/2020 2:04 PM

## 2020-04-11 NOTE — BH Assessment (Addendum)
Referral information for Psychiatric Hospitalization faxed to;   Marland Kitchen [facility] [phone]),   . [facility] [phone] -or- [phone]) [phone]   . [facility] ([phone]),   . [facility] [phone], [phone], [phone] or [phone]),   . [facility] [phone] or [phone])   . [place] [phone] or [phone]),    [facility] [phone] -or- [phone]),

## 2020-04-12 DIAGNOSIS — Z973 Presence of spectacles and contact lenses: Secondary | ICD-10-CM | POA: Diagnosis not present

## 2020-04-12 DIAGNOSIS — E785 Hyperlipidemia, unspecified: Secondary | ICD-10-CM | POA: Diagnosis present

## 2020-04-12 DIAGNOSIS — C8590 Non-Hodgkin lymphoma, unspecified, unspecified site: Secondary | ICD-10-CM | POA: Diagnosis not present

## 2020-04-12 DIAGNOSIS — M1990 Unspecified osteoarthritis, unspecified site: Secondary | ICD-10-CM | POA: Diagnosis not present

## 2020-04-12 DIAGNOSIS — Z8572 Personal history of non-Hodgkin lymphomas: Secondary | ICD-10-CM | POA: Diagnosis not present

## 2020-04-12 DIAGNOSIS — Z20822 Contact with and (suspected) exposure to covid-19: Secondary | ICD-10-CM | POA: Diagnosis not present

## 2020-04-12 DIAGNOSIS — Z7189 Other specified counseling: Secondary | ICD-10-CM | POA: Diagnosis not present

## 2020-04-12 DIAGNOSIS — G894 Chronic pain syndrome: Secondary | ICD-10-CM | POA: Diagnosis not present

## 2020-04-12 DIAGNOSIS — R4182 Altered mental status, unspecified: Secondary | ICD-10-CM | POA: Diagnosis not present

## 2020-04-12 DIAGNOSIS — H5702 Anisocoria: Secondary | ICD-10-CM | POA: Diagnosis present

## 2020-04-12 DIAGNOSIS — K449 Diaphragmatic hernia without obstruction or gangrene: Secondary | ICD-10-CM | POA: Diagnosis not present

## 2020-04-12 DIAGNOSIS — Z9181 History of falling: Secondary | ICD-10-CM | POA: Diagnosis not present

## 2020-04-12 DIAGNOSIS — G20 Parkinson's disease: Secondary | ICD-10-CM | POA: Diagnosis present

## 2020-04-12 DIAGNOSIS — Z86010 Personal history of colonic polyps: Secondary | ICD-10-CM | POA: Diagnosis not present

## 2020-04-12 DIAGNOSIS — Z515 Encounter for palliative care: Secondary | ICD-10-CM | POA: Diagnosis not present

## 2020-04-12 DIAGNOSIS — S069X9A Unspecified intracranial injury with loss of consciousness of unspecified duration, initial encounter: Secondary | ICD-10-CM | POA: Diagnosis not present

## 2020-04-12 DIAGNOSIS — F0390 Unspecified dementia without behavioral disturbance: Secondary | ICD-10-CM | POA: Diagnosis not present

## 2020-04-12 DIAGNOSIS — R8290 Unspecified abnormal findings in urine: Secondary | ICD-10-CM | POA: Diagnosis not present

## 2020-04-12 DIAGNOSIS — R296 Repeated falls: Secondary | ICD-10-CM | POA: Diagnosis not present

## 2020-04-12 DIAGNOSIS — H9193 Unspecified hearing loss, bilateral: Secondary | ICD-10-CM | POA: Diagnosis present

## 2020-04-12 DIAGNOSIS — E782 Mixed hyperlipidemia: Secondary | ICD-10-CM | POA: Diagnosis not present

## 2020-04-12 DIAGNOSIS — K219 Gastro-esophageal reflux disease without esophagitis: Secondary | ICD-10-CM | POA: Diagnosis present

## 2020-04-12 DIAGNOSIS — K5900 Constipation, unspecified: Secondary | ICD-10-CM | POA: Diagnosis not present

## 2020-04-12 DIAGNOSIS — F0391 Unspecified dementia with behavioral disturbance: Secondary | ICD-10-CM | POA: Diagnosis not present

## 2020-04-12 DIAGNOSIS — G8929 Other chronic pain: Secondary | ICD-10-CM | POA: Diagnosis present

## 2020-04-12 DIAGNOSIS — Z23 Encounter for immunization: Secondary | ICD-10-CM | POA: Diagnosis not present

## 2020-04-12 DIAGNOSIS — Z79899 Other long term (current) drug therapy: Secondary | ICD-10-CM | POA: Diagnosis not present

## 2020-04-12 DIAGNOSIS — Z66 Do not resuscitate: Secondary | ICD-10-CM | POA: Diagnosis present

## 2020-04-12 DIAGNOSIS — Z87891 Personal history of nicotine dependence: Secondary | ICD-10-CM | POA: Diagnosis not present

## 2020-04-12 DIAGNOSIS — R634 Abnormal weight loss: Secondary | ICD-10-CM | POA: Diagnosis present

## 2020-04-12 DIAGNOSIS — Z96652 Presence of left artificial knee joint: Secondary | ICD-10-CM | POA: Diagnosis not present

## 2020-04-12 DIAGNOSIS — Z8679 Personal history of other diseases of the circulatory system: Secondary | ICD-10-CM | POA: Diagnosis not present

## 2020-04-12 DIAGNOSIS — Z6821 Body mass index (BMI) 21.0-21.9, adult: Secondary | ICD-10-CM | POA: Diagnosis not present

## 2020-04-12 DIAGNOSIS — F0281 Dementia in other diseases classified elsewhere with behavioral disturbance: Secondary | ICD-10-CM | POA: Diagnosis not present

## 2020-04-12 LAB — URINALYSIS, COMPLETE (UACMP) WITH MICROSCOPIC
Bilirubin Urine: NEGATIVE
Glucose, UA: NEGATIVE mg/dL
Hgb urine dipstick: NEGATIVE
Ketones, ur: 5 mg/dL — AB
Nitrite: NEGATIVE
Protein, ur: NEGATIVE mg/dL
Specific Gravity, Urine: 1.031 — ABNORMAL HIGH (ref 1.005–1.030)
pH: 5 (ref 5.0–8.0)

## 2020-04-12 MED ORDER — FOSFOMYCIN TROMETHAMINE 3 G PO PACK
3.0000 g | PACK | Freq: Once | ORAL | Status: AC
  Administered 2020-04-12: 3 g via ORAL
  Filled 2020-04-12: qty 3

## 2020-04-12 MED ORDER — MIDAZOLAM HCL 2 MG/2ML IJ SOLN
1.0000 mg | Freq: Once | INTRAMUSCULAR | Status: DC
  Filled 2020-04-12: qty 2

## 2020-04-12 MED ORDER — MIDAZOLAM HCL 2 MG/2ML IJ SOLN
1.0000 mg | Freq: Once | INTRAMUSCULAR | Status: AC
  Administered 2020-04-12: 1 mg via INTRAMUSCULAR
  Filled 2020-04-12: qty 2

## 2020-04-12 MED ORDER — CARBIDOPA-LEVODOPA 25-100 MG PO TABS
1.0000 | ORAL_TABLET | Freq: Four times a day (QID) | ORAL | Status: DC
  Administered 2020-04-12: 1 via ORAL
  Filled 2020-04-12 (×3): qty 1

## 2020-04-12 MED ORDER — MIDAZOLAM HCL 2 MG/2ML IJ SOLN
1.0000 mg | Freq: Once | INTRAMUSCULAR | Status: AC
  Administered 2020-04-12: 1 mg via INTRAMUSCULAR

## 2020-04-12 NOTE — ED Notes (Signed)
Hourly rounding reveals patient in room. No complaints, stable, in no acute distress. Q15 minute rounds and monitoring via Rover and Officer to continue.   

## 2020-04-12 NOTE — ED Notes (Signed)
Called pt husband and phone given to pt to speak with him.

## 2020-04-12 NOTE — ED Notes (Signed)
Pt showing increased agitation again and starting to keep wandering into hall.  Attempting to keep redirecting.  Dr Tamala Julian notified.

## 2020-04-12 NOTE — ED Provider Notes (Signed)
Emergency Medicine Observation Re-evaluation Note  Sarah Donaldson is a 69 y.o. female, seen on rounds today.  Pt initially presented to the ED for complaints of Psychiatric Evaluation Currently, the patient is resting without complaint.  Physical Exam  BP 133/64 (BP Location: Right Arm)   Pulse (!) 103   Temp 98.1 F (36.7 C) (Oral)   Resp 20   Ht 5\' 3"  (1.6 m)   Wt 68 kg   SpO2 99%   BMI 26.56 kg/m  Physical Exam General: Sitting up in no acute distress Cardiac: No cyanosis Lungs: Equal rise and fall Psych: Occasional agitation which is verbally redirectable by staff  ED Course / MDM  EKG:    I have reviewed the labs performed to date as well as medications administered while in observation.  Recent changes in the last 24 hours include no acute changes.  Plan  Current plan is for TTS has referred patient to a number of facilities. Patient is not under full IVC at this time.   Paulette Blanch, MD 04/12/20 [phone]

## 2020-04-12 NOTE — ED Notes (Signed)
Pt still intermittently yelling out but is more calm than before.

## 2020-04-12 NOTE — BH Assessment (Signed)
Patient has been accepted to [facility].  Patient assigned to Geriatric Unit Accepting physician is Dr. Ronnald Ramp.  Call report to [phone].  Representative was [employer].   ER Staff is aware of it:  Holley Raring, ER Starling Manns, Patient's Nurse

## 2020-04-12 NOTE — ED Notes (Signed)
Pt resting with eyes closed, unlabored respirations.NAD

## 2020-04-12 NOTE — BH Assessment (Signed)
Referral information for Psychiatric Hospitalization faxed to;    [facility] ([phone]- [phone]), Jenny Reichmann denies not accepting females at this time due to high acuity on the unit   [facility] [phone]- [phone]) [phone] No intake staff available until 7:30am   [facility] ([phone]), No intake staff available until day shift, left a [employer] [phone], [phone], [phone] or [phone]), No intake staff available until day shift   [facility] [phone] or [phone]) Horris Latino reports patient denied due to frequent falls and Parkinson's disease   [place] [phone] or [phone]), No answer no voicemail available   [place] [phone] -or- [phone]),Denied due dementia

## 2020-04-12 NOTE — ED Notes (Signed)
Updated husband.  Informed him at this time pt would probably do better if he did not visit during the afternoon visiting hours as when he comes and leaves and she cannot go with him it will likely set her off again.  Husband very understanding and will call back for further update this afternoon.

## 2020-04-12 NOTE — ED Notes (Signed)
Pt did not sign consent for transfer, IVC.  All belongings given to husband who is aware of transfer.  Sheriff here for pt.

## 2020-04-12 NOTE — ED Notes (Signed)
Attempting to change pt clothes and clean pt up but getting combative.  Will leave pt in current clothing at this time. Refusing all daily personal care.

## 2020-04-12 NOTE — ED Notes (Signed)
Pt agitated at this time and refusing bath and hygiene care.

## 2020-04-12 NOTE — ED Notes (Signed)
Pt up with NT to use bathroom

## 2020-04-12 NOTE — ED Notes (Signed)
Pt folding wash cloths.

## 2020-04-12 NOTE — ED Notes (Signed)
Pt doing well after medication. Still somewhat agitated but is calmer than before. Will continue to monitor. NT within visual of pt.

## 2020-04-12 NOTE — ED Notes (Signed)
Pt husband, [name], at bedside

## 2020-04-12 NOTE — ED Notes (Signed)
Given lunch tray.

## 2020-04-12 NOTE — ED Notes (Signed)
MD given note requesting update of one of pt meds.

## 2020-04-12 NOTE — ED Notes (Signed)
Pt screaming and getting up. Trying to leave.  Will not take her home meds.  Very agitated.  Dr Tamala Julian at bedside.

## 2020-04-12 NOTE — ED Notes (Signed)
Pt given dinner tray.

## 2020-04-13 DIAGNOSIS — F0281 Dementia in other diseases classified elsewhere with behavioral disturbance: Secondary | ICD-10-CM | POA: Diagnosis not present

## 2020-04-13 DIAGNOSIS — G894 Chronic pain syndrome: Secondary | ICD-10-CM | POA: Diagnosis not present

## 2020-04-13 DIAGNOSIS — C8590 Non-Hodgkin lymphoma, unspecified, unspecified site: Secondary | ICD-10-CM | POA: Diagnosis not present

## 2020-04-13 DIAGNOSIS — G20 Parkinson's disease: Secondary | ICD-10-CM | POA: Diagnosis not present

## 2020-04-13 DIAGNOSIS — E782 Mixed hyperlipidemia: Secondary | ICD-10-CM | POA: Diagnosis not present

## 2020-04-13 DIAGNOSIS — Z23 Encounter for immunization: Secondary | ICD-10-CM | POA: Diagnosis not present

## 2020-04-13 DIAGNOSIS — R8290 Unspecified abnormal findings in urine: Secondary | ICD-10-CM | POA: Diagnosis not present

## 2020-04-13 DIAGNOSIS — K219 Gastro-esophageal reflux disease without esophagitis: Secondary | ICD-10-CM | POA: Diagnosis not present

## 2020-04-13 DIAGNOSIS — Z8679 Personal history of other diseases of the circulatory system: Secondary | ICD-10-CM | POA: Diagnosis not present

## 2020-04-13 DIAGNOSIS — R296 Repeated falls: Secondary | ICD-10-CM | POA: Diagnosis not present

## 2020-04-13 DIAGNOSIS — K449 Diaphragmatic hernia without obstruction or gangrene: Secondary | ICD-10-CM | POA: Diagnosis not present

## 2020-04-13 DIAGNOSIS — H9193 Unspecified hearing loss, bilateral: Secondary | ICD-10-CM | POA: Diagnosis not present

## 2020-04-13 DIAGNOSIS — R634 Abnormal weight loss: Secondary | ICD-10-CM | POA: Diagnosis not present

## 2020-04-14 DIAGNOSIS — R8290 Unspecified abnormal findings in urine: Secondary | ICD-10-CM | POA: Diagnosis not present

## 2020-04-14 DIAGNOSIS — G20 Parkinson's disease: Secondary | ICD-10-CM | POA: Diagnosis not present

## 2020-04-14 DIAGNOSIS — C8590 Non-Hodgkin lymphoma, unspecified, unspecified site: Secondary | ICD-10-CM | POA: Diagnosis not present

## 2020-04-14 DIAGNOSIS — R296 Repeated falls: Secondary | ICD-10-CM | POA: Diagnosis not present

## 2020-04-14 DIAGNOSIS — S069X9A Unspecified intracranial injury with loss of consciousness of unspecified duration, initial encounter: Secondary | ICD-10-CM | POA: Diagnosis not present

## 2020-04-14 DIAGNOSIS — F0281 Dementia in other diseases classified elsewhere with behavioral disturbance: Secondary | ICD-10-CM | POA: Diagnosis not present

## 2020-04-14 DIAGNOSIS — Z8679 Personal history of other diseases of the circulatory system: Secondary | ICD-10-CM | POA: Diagnosis not present

## 2020-04-14 DIAGNOSIS — R634 Abnormal weight loss: Secondary | ICD-10-CM | POA: Diagnosis not present

## 2020-04-14 LAB — URINE CULTURE: Culture: 40000 — AB

## 2020-04-15 DIAGNOSIS — G20 Parkinson's disease: Secondary | ICD-10-CM | POA: Diagnosis not present

## 2020-04-15 DIAGNOSIS — F0281 Dementia in other diseases classified elsewhere with behavioral disturbance: Secondary | ICD-10-CM | POA: Diagnosis not present

## 2020-04-16 DIAGNOSIS — F0281 Dementia in other diseases classified elsewhere with behavioral disturbance: Secondary | ICD-10-CM | POA: Diagnosis not present

## 2020-04-16 DIAGNOSIS — Z8679 Personal history of other diseases of the circulatory system: Secondary | ICD-10-CM | POA: Diagnosis not present

## 2020-04-16 DIAGNOSIS — Z973 Presence of spectacles and contact lenses: Secondary | ICD-10-CM | POA: Diagnosis not present

## 2020-04-16 DIAGNOSIS — R296 Repeated falls: Secondary | ICD-10-CM | POA: Diagnosis not present

## 2020-04-16 DIAGNOSIS — E782 Mixed hyperlipidemia: Secondary | ICD-10-CM | POA: Diagnosis not present

## 2020-04-16 DIAGNOSIS — G20 Parkinson's disease: Secondary | ICD-10-CM | POA: Diagnosis not present

## 2020-04-16 DIAGNOSIS — Z7189 Other specified counseling: Secondary | ICD-10-CM | POA: Diagnosis not present

## 2020-04-16 DIAGNOSIS — R634 Abnormal weight loss: Secondary | ICD-10-CM | POA: Diagnosis not present

## 2020-04-16 DIAGNOSIS — C8590 Non-Hodgkin lymphoma, unspecified, unspecified site: Secondary | ICD-10-CM | POA: Diagnosis not present

## 2020-04-16 DIAGNOSIS — G894 Chronic pain syndrome: Secondary | ICD-10-CM | POA: Diagnosis not present

## 2020-04-16 DIAGNOSIS — M1990 Unspecified osteoarthritis, unspecified site: Secondary | ICD-10-CM | POA: Diagnosis not present

## 2020-04-16 DIAGNOSIS — Z515 Encounter for palliative care: Secondary | ICD-10-CM | POA: Diagnosis not present

## 2020-04-16 DIAGNOSIS — K449 Diaphragmatic hernia without obstruction or gangrene: Secondary | ICD-10-CM | POA: Diagnosis not present

## 2020-04-16 DIAGNOSIS — K219 Gastro-esophageal reflux disease without esophagitis: Secondary | ICD-10-CM | POA: Diagnosis not present

## 2020-04-17 DIAGNOSIS — F0281 Dementia in other diseases classified elsewhere with behavioral disturbance: Secondary | ICD-10-CM | POA: Diagnosis not present

## 2020-04-17 DIAGNOSIS — G20 Parkinson's disease: Secondary | ICD-10-CM | POA: Diagnosis not present

## 2020-04-18 DIAGNOSIS — G894 Chronic pain syndrome: Secondary | ICD-10-CM | POA: Diagnosis not present

## 2020-04-18 DIAGNOSIS — M1990 Unspecified osteoarthritis, unspecified site: Secondary | ICD-10-CM | POA: Diagnosis not present

## 2020-04-18 DIAGNOSIS — Z973 Presence of spectacles and contact lenses: Secondary | ICD-10-CM | POA: Diagnosis not present

## 2020-04-18 DIAGNOSIS — Z8679 Personal history of other diseases of the circulatory system: Secondary | ICD-10-CM | POA: Diagnosis not present

## 2020-04-18 DIAGNOSIS — K219 Gastro-esophageal reflux disease without esophagitis: Secondary | ICD-10-CM | POA: Diagnosis not present

## 2020-04-18 DIAGNOSIS — R296 Repeated falls: Secondary | ICD-10-CM | POA: Diagnosis not present

## 2020-04-18 DIAGNOSIS — G20 Parkinson's disease: Secondary | ICD-10-CM | POA: Diagnosis not present

## 2020-04-18 DIAGNOSIS — R634 Abnormal weight loss: Secondary | ICD-10-CM | POA: Diagnosis not present

## 2020-04-18 DIAGNOSIS — F0281 Dementia in other diseases classified elsewhere with behavioral disturbance: Secondary | ICD-10-CM | POA: Diagnosis not present

## 2020-04-18 DIAGNOSIS — K449 Diaphragmatic hernia without obstruction or gangrene: Secondary | ICD-10-CM | POA: Diagnosis not present

## 2020-04-18 DIAGNOSIS — C8590 Non-Hodgkin lymphoma, unspecified, unspecified site: Secondary | ICD-10-CM | POA: Diagnosis not present

## 2020-04-18 DIAGNOSIS — E782 Mixed hyperlipidemia: Secondary | ICD-10-CM | POA: Diagnosis not present

## 2020-04-19 DIAGNOSIS — F0281 Dementia in other diseases classified elsewhere with behavioral disturbance: Secondary | ICD-10-CM | POA: Diagnosis not present

## 2020-04-19 DIAGNOSIS — G20 Parkinson's disease: Secondary | ICD-10-CM | POA: Diagnosis not present

## 2020-04-20 DIAGNOSIS — M1990 Unspecified osteoarthritis, unspecified site: Secondary | ICD-10-CM | POA: Diagnosis not present

## 2020-04-20 DIAGNOSIS — R296 Repeated falls: Secondary | ICD-10-CM | POA: Diagnosis not present

## 2020-04-20 DIAGNOSIS — E782 Mixed hyperlipidemia: Secondary | ICD-10-CM | POA: Diagnosis not present

## 2020-04-20 DIAGNOSIS — F0281 Dementia in other diseases classified elsewhere with behavioral disturbance: Secondary | ICD-10-CM | POA: Diagnosis not present

## 2020-04-20 DIAGNOSIS — R634 Abnormal weight loss: Secondary | ICD-10-CM | POA: Diagnosis not present

## 2020-04-20 DIAGNOSIS — Z973 Presence of spectacles and contact lenses: Secondary | ICD-10-CM | POA: Diagnosis not present

## 2020-04-20 DIAGNOSIS — G20 Parkinson's disease: Secondary | ICD-10-CM | POA: Diagnosis not present

## 2020-04-20 DIAGNOSIS — Z8679 Personal history of other diseases of the circulatory system: Secondary | ICD-10-CM | POA: Diagnosis not present

## 2020-04-20 DIAGNOSIS — K219 Gastro-esophageal reflux disease without esophagitis: Secondary | ICD-10-CM | POA: Diagnosis not present

## 2020-04-20 DIAGNOSIS — G894 Chronic pain syndrome: Secondary | ICD-10-CM | POA: Diagnosis not present

## 2020-04-20 DIAGNOSIS — C8590 Non-Hodgkin lymphoma, unspecified, unspecified site: Secondary | ICD-10-CM | POA: Diagnosis not present

## 2020-04-21 DIAGNOSIS — G20 Parkinson's disease: Secondary | ICD-10-CM | POA: Diagnosis not present

## 2020-04-21 DIAGNOSIS — F0281 Dementia in other diseases classified elsewhere with behavioral disturbance: Secondary | ICD-10-CM | POA: Diagnosis not present

## 2020-04-22 DIAGNOSIS — R634 Abnormal weight loss: Secondary | ICD-10-CM | POA: Diagnosis not present

## 2020-04-22 DIAGNOSIS — R296 Repeated falls: Secondary | ICD-10-CM | POA: Diagnosis not present

## 2020-04-22 DIAGNOSIS — R8290 Unspecified abnormal findings in urine: Secondary | ICD-10-CM | POA: Diagnosis not present

## 2020-04-22 DIAGNOSIS — F0281 Dementia in other diseases classified elsewhere with behavioral disturbance: Secondary | ICD-10-CM | POA: Diagnosis not present

## 2020-04-22 DIAGNOSIS — G20 Parkinson's disease: Secondary | ICD-10-CM | POA: Diagnosis not present

## 2020-04-23 ENCOUNTER — Other Ambulatory Visit (HOSPITAL_COMMUNITY): Payer: Self-pay | Admitting: Psychiatry

## 2020-04-23 DIAGNOSIS — G20 Parkinson's disease: Secondary | ICD-10-CM | POA: Diagnosis not present

## 2020-04-23 DIAGNOSIS — F0281 Dementia in other diseases classified elsewhere with behavioral disturbance: Secondary | ICD-10-CM | POA: Diagnosis not present

## 2020-04-24 DIAGNOSIS — F0281 Dementia in other diseases classified elsewhere with behavioral disturbance: Secondary | ICD-10-CM | POA: Diagnosis not present

## 2020-04-24 DIAGNOSIS — G20 Parkinson's disease: Secondary | ICD-10-CM | POA: Diagnosis not present

## 2020-04-25 DIAGNOSIS — F0281 Dementia in other diseases classified elsewhere with behavioral disturbance: Secondary | ICD-10-CM | POA: Diagnosis not present

## 2020-04-25 DIAGNOSIS — G20 Parkinson's disease: Secondary | ICD-10-CM | POA: Diagnosis not present

## 2020-04-26 ENCOUNTER — Telehealth: Payer: Self-pay | Admitting: Internal Medicine

## 2020-04-26 DIAGNOSIS — F0281 Dementia in other diseases classified elsewhere with behavioral disturbance: Secondary | ICD-10-CM | POA: Diagnosis not present

## 2020-04-26 DIAGNOSIS — G20 Parkinson's disease: Secondary | ICD-10-CM | POA: Diagnosis not present

## 2020-04-26 NOTE — Telephone Encounter (Signed)
Patient's husband phoned and stated that patient was in a hospital in [place] and needed to cancel patient's 04-30-20 appt. Appt cancelled per husband's request. Husband is unsure when patient will discharge and is to call [place] and reschedule close to discharge.

## 2020-04-27 DIAGNOSIS — G20 Parkinson's disease: Secondary | ICD-10-CM | POA: Diagnosis not present

## 2020-04-27 DIAGNOSIS — F0281 Dementia in other diseases classified elsewhere with behavioral disturbance: Secondary | ICD-10-CM | POA: Diagnosis not present

## 2020-04-28 DIAGNOSIS — F0281 Dementia in other diseases classified elsewhere with behavioral disturbance: Secondary | ICD-10-CM | POA: Diagnosis not present

## 2020-04-28 DIAGNOSIS — G20 Parkinson's disease: Secondary | ICD-10-CM | POA: Diagnosis not present

## 2020-04-29 DIAGNOSIS — F0391 Unspecified dementia with behavioral disturbance: Secondary | ICD-10-CM | POA: Diagnosis not present

## 2020-04-30 ENCOUNTER — Inpatient Hospital Stay: Payer: Medicare Other

## 2020-04-30 ENCOUNTER — Inpatient Hospital Stay: Payer: Medicare Other | Admitting: Internal Medicine

## 2020-04-30 DIAGNOSIS — Z515 Encounter for palliative care: Secondary | ICD-10-CM | POA: Diagnosis not present

## 2020-04-30 DIAGNOSIS — Z8679 Personal history of other diseases of the circulatory system: Secondary | ICD-10-CM | POA: Diagnosis not present

## 2020-04-30 DIAGNOSIS — F0281 Dementia in other diseases classified elsewhere with behavioral disturbance: Secondary | ICD-10-CM | POA: Diagnosis not present

## 2020-04-30 DIAGNOSIS — Z23 Encounter for immunization: Secondary | ICD-10-CM | POA: Diagnosis not present

## 2020-04-30 DIAGNOSIS — G20 Parkinson's disease: Secondary | ICD-10-CM | POA: Diagnosis not present

## 2020-04-30 DIAGNOSIS — F0391 Unspecified dementia with behavioral disturbance: Secondary | ICD-10-CM | POA: Diagnosis not present

## 2020-04-30 DIAGNOSIS — K219 Gastro-esophageal reflux disease without esophagitis: Secondary | ICD-10-CM | POA: Diagnosis not present

## 2020-04-30 DIAGNOSIS — R634 Abnormal weight loss: Secondary | ICD-10-CM | POA: Diagnosis not present

## 2020-04-30 DIAGNOSIS — M1990 Unspecified osteoarthritis, unspecified site: Secondary | ICD-10-CM | POA: Diagnosis not present

## 2020-04-30 DIAGNOSIS — Z973 Presence of spectacles and contact lenses: Secondary | ICD-10-CM | POA: Diagnosis not present

## 2020-04-30 DIAGNOSIS — C8590 Non-Hodgkin lymphoma, unspecified, unspecified site: Secondary | ICD-10-CM | POA: Diagnosis not present

## 2020-04-30 DIAGNOSIS — K449 Diaphragmatic hernia without obstruction or gangrene: Secondary | ICD-10-CM | POA: Diagnosis not present

## 2020-04-30 DIAGNOSIS — G894 Chronic pain syndrome: Secondary | ICD-10-CM | POA: Diagnosis not present

## 2020-04-30 DIAGNOSIS — R296 Repeated falls: Secondary | ICD-10-CM | POA: Diagnosis not present

## 2020-04-30 DIAGNOSIS — E782 Mixed hyperlipidemia: Secondary | ICD-10-CM | POA: Diagnosis not present

## 2020-05-01 DIAGNOSIS — F0391 Unspecified dementia with behavioral disturbance: Secondary | ICD-10-CM | POA: Diagnosis not present

## 2020-05-02 DIAGNOSIS — E782 Mixed hyperlipidemia: Secondary | ICD-10-CM | POA: Diagnosis not present

## 2020-05-02 DIAGNOSIS — Z8679 Personal history of other diseases of the circulatory system: Secondary | ICD-10-CM | POA: Diagnosis not present

## 2020-05-02 DIAGNOSIS — R296 Repeated falls: Secondary | ICD-10-CM | POA: Diagnosis not present

## 2020-05-02 DIAGNOSIS — R634 Abnormal weight loss: Secondary | ICD-10-CM | POA: Diagnosis not present

## 2020-05-02 DIAGNOSIS — F0391 Unspecified dementia with behavioral disturbance: Secondary | ICD-10-CM | POA: Diagnosis not present

## 2020-05-02 DIAGNOSIS — G894 Chronic pain syndrome: Secondary | ICD-10-CM | POA: Diagnosis not present

## 2020-05-02 DIAGNOSIS — M1990 Unspecified osteoarthritis, unspecified site: Secondary | ICD-10-CM | POA: Diagnosis not present

## 2020-05-02 DIAGNOSIS — K449 Diaphragmatic hernia without obstruction or gangrene: Secondary | ICD-10-CM | POA: Diagnosis not present

## 2020-05-02 DIAGNOSIS — G20 Parkinson's disease: Secondary | ICD-10-CM | POA: Diagnosis not present

## 2020-05-02 DIAGNOSIS — K219 Gastro-esophageal reflux disease without esophagitis: Secondary | ICD-10-CM | POA: Diagnosis not present

## 2020-05-02 DIAGNOSIS — F0281 Dementia in other diseases classified elsewhere with behavioral disturbance: Secondary | ICD-10-CM | POA: Diagnosis not present

## 2020-05-02 DIAGNOSIS — C8590 Non-Hodgkin lymphoma, unspecified, unspecified site: Secondary | ICD-10-CM | POA: Diagnosis not present

## 2020-05-02 DIAGNOSIS — Z973 Presence of spectacles and contact lenses: Secondary | ICD-10-CM | POA: Diagnosis not present

## 2020-05-03 DIAGNOSIS — F0391 Unspecified dementia with behavioral disturbance: Secondary | ICD-10-CM | POA: Diagnosis not present

## 2020-05-04 ENCOUNTER — Other Ambulatory Visit: Payer: Self-pay | Admitting: Internal Medicine

## 2020-05-04 DIAGNOSIS — Z8679 Personal history of other diseases of the circulatory system: Secondary | ICD-10-CM | POA: Diagnosis not present

## 2020-05-04 DIAGNOSIS — E782 Mixed hyperlipidemia: Secondary | ICD-10-CM | POA: Diagnosis not present

## 2020-05-04 DIAGNOSIS — K219 Gastro-esophageal reflux disease without esophagitis: Secondary | ICD-10-CM | POA: Diagnosis not present

## 2020-05-04 DIAGNOSIS — C8590 Non-Hodgkin lymphoma, unspecified, unspecified site: Secondary | ICD-10-CM | POA: Diagnosis not present

## 2020-05-04 DIAGNOSIS — G894 Chronic pain syndrome: Secondary | ICD-10-CM | POA: Diagnosis not present

## 2020-05-04 DIAGNOSIS — Z973 Presence of spectacles and contact lenses: Secondary | ICD-10-CM | POA: Diagnosis not present

## 2020-05-04 DIAGNOSIS — M1990 Unspecified osteoarthritis, unspecified site: Secondary | ICD-10-CM | POA: Diagnosis not present

## 2020-05-04 DIAGNOSIS — F0391 Unspecified dementia with behavioral disturbance: Secondary | ICD-10-CM | POA: Diagnosis not present

## 2020-05-04 DIAGNOSIS — G20 Parkinson's disease: Secondary | ICD-10-CM | POA: Diagnosis not present

## 2020-05-04 DIAGNOSIS — F0281 Dementia in other diseases classified elsewhere with behavioral disturbance: Secondary | ICD-10-CM | POA: Diagnosis not present

## 2020-05-04 DIAGNOSIS — E876 Hypokalemia: Secondary | ICD-10-CM

## 2020-05-04 DIAGNOSIS — R634 Abnormal weight loss: Secondary | ICD-10-CM | POA: Diagnosis not present

## 2020-05-04 DIAGNOSIS — R296 Repeated falls: Secondary | ICD-10-CM | POA: Diagnosis not present

## 2020-05-05 DIAGNOSIS — F0391 Unspecified dementia with behavioral disturbance: Secondary | ICD-10-CM | POA: Diagnosis not present

## 2020-05-06 DIAGNOSIS — R634 Abnormal weight loss: Secondary | ICD-10-CM | POA: Diagnosis not present

## 2020-05-06 DIAGNOSIS — F0281 Dementia in other diseases classified elsewhere with behavioral disturbance: Secondary | ICD-10-CM | POA: Diagnosis not present

## 2020-05-06 DIAGNOSIS — G20 Parkinson's disease: Secondary | ICD-10-CM | POA: Diagnosis not present

## 2020-05-06 DIAGNOSIS — R296 Repeated falls: Secondary | ICD-10-CM | POA: Diagnosis not present

## 2020-05-07 DIAGNOSIS — F0281 Dementia in other diseases classified elsewhere with behavioral disturbance: Secondary | ICD-10-CM | POA: Diagnosis not present

## 2020-05-07 DIAGNOSIS — G20 Parkinson's disease: Secondary | ICD-10-CM | POA: Diagnosis not present

## 2020-05-08 DIAGNOSIS — R296 Repeated falls: Secondary | ICD-10-CM | POA: Diagnosis not present

## 2020-05-08 DIAGNOSIS — G894 Chronic pain syndrome: Secondary | ICD-10-CM | POA: Diagnosis not present

## 2020-05-08 DIAGNOSIS — G20 Parkinson's disease: Secondary | ICD-10-CM | POA: Diagnosis not present

## 2020-05-08 DIAGNOSIS — F0281 Dementia in other diseases classified elsewhere with behavioral disturbance: Secondary | ICD-10-CM | POA: Diagnosis not present

## 2020-05-08 DIAGNOSIS — R634 Abnormal weight loss: Secondary | ICD-10-CM | POA: Diagnosis not present

## 2020-05-08 DIAGNOSIS — K219 Gastro-esophageal reflux disease without esophagitis: Secondary | ICD-10-CM | POA: Diagnosis not present

## 2020-05-08 DIAGNOSIS — E782 Mixed hyperlipidemia: Secondary | ICD-10-CM | POA: Diagnosis not present

## 2020-05-08 DIAGNOSIS — Z973 Presence of spectacles and contact lenses: Secondary | ICD-10-CM | POA: Diagnosis not present

## 2020-05-08 DIAGNOSIS — Z8679 Personal history of other diseases of the circulatory system: Secondary | ICD-10-CM | POA: Diagnosis not present

## 2020-05-08 DIAGNOSIS — C8590 Non-Hodgkin lymphoma, unspecified, unspecified site: Secondary | ICD-10-CM | POA: Diagnosis not present

## 2020-05-08 DIAGNOSIS — R4182 Altered mental status, unspecified: Secondary | ICD-10-CM | POA: Diagnosis not present

## 2020-05-08 DIAGNOSIS — M1990 Unspecified osteoarthritis, unspecified site: Secondary | ICD-10-CM | POA: Diagnosis not present

## 2020-05-09 DIAGNOSIS — F0281 Dementia in other diseases classified elsewhere with behavioral disturbance: Secondary | ICD-10-CM | POA: Diagnosis not present

## 2020-05-09 DIAGNOSIS — G20 Parkinson's disease: Secondary | ICD-10-CM | POA: Diagnosis not present

## 2020-05-10 DIAGNOSIS — F0281 Dementia in other diseases classified elsewhere with behavioral disturbance: Secondary | ICD-10-CM | POA: Diagnosis not present

## 2020-05-10 DIAGNOSIS — G20 Parkinson's disease: Secondary | ICD-10-CM | POA: Diagnosis not present

## 2020-05-11 DIAGNOSIS — R296 Repeated falls: Secondary | ICD-10-CM | POA: Diagnosis not present

## 2020-05-11 DIAGNOSIS — R634 Abnormal weight loss: Secondary | ICD-10-CM | POA: Diagnosis not present

## 2020-05-13 DIAGNOSIS — G894 Chronic pain syndrome: Secondary | ICD-10-CM | POA: Diagnosis not present

## 2020-05-13 DIAGNOSIS — C8590 Non-Hodgkin lymphoma, unspecified, unspecified site: Secondary | ICD-10-CM | POA: Diagnosis not present

## 2020-05-13 DIAGNOSIS — Z973 Presence of spectacles and contact lenses: Secondary | ICD-10-CM | POA: Diagnosis not present

## 2020-05-13 DIAGNOSIS — R634 Abnormal weight loss: Secondary | ICD-10-CM | POA: Diagnosis not present

## 2020-05-13 DIAGNOSIS — K449 Diaphragmatic hernia without obstruction or gangrene: Secondary | ICD-10-CM | POA: Diagnosis not present

## 2020-05-13 DIAGNOSIS — Z8679 Personal history of other diseases of the circulatory system: Secondary | ICD-10-CM | POA: Diagnosis not present

## 2020-05-13 DIAGNOSIS — E782 Mixed hyperlipidemia: Secondary | ICD-10-CM | POA: Diagnosis not present

## 2020-05-13 DIAGNOSIS — K219 Gastro-esophageal reflux disease without esophagitis: Secondary | ICD-10-CM | POA: Diagnosis not present

## 2020-05-13 DIAGNOSIS — G20 Parkinson's disease: Secondary | ICD-10-CM | POA: Diagnosis not present

## 2020-05-13 DIAGNOSIS — F0281 Dementia in other diseases classified elsewhere with behavioral disturbance: Secondary | ICD-10-CM | POA: Diagnosis not present

## 2020-05-13 DIAGNOSIS — M1990 Unspecified osteoarthritis, unspecified site: Secondary | ICD-10-CM | POA: Diagnosis not present

## 2020-05-13 DIAGNOSIS — R296 Repeated falls: Secondary | ICD-10-CM | POA: Diagnosis not present

## 2020-05-15 DIAGNOSIS — R634 Abnormal weight loss: Secondary | ICD-10-CM | POA: Diagnosis not present

## 2020-05-15 DIAGNOSIS — Z973 Presence of spectacles and contact lenses: Secondary | ICD-10-CM | POA: Diagnosis not present

## 2020-05-15 DIAGNOSIS — R296 Repeated falls: Secondary | ICD-10-CM | POA: Diagnosis not present

## 2020-05-15 DIAGNOSIS — G20 Parkinson's disease: Secondary | ICD-10-CM | POA: Diagnosis not present

## 2020-05-15 DIAGNOSIS — E782 Mixed hyperlipidemia: Secondary | ICD-10-CM | POA: Diagnosis not present

## 2020-05-15 DIAGNOSIS — F0281 Dementia in other diseases classified elsewhere with behavioral disturbance: Secondary | ICD-10-CM | POA: Diagnosis not present

## 2020-05-15 DIAGNOSIS — K449 Diaphragmatic hernia without obstruction or gangrene: Secondary | ICD-10-CM | POA: Diagnosis not present

## 2020-05-15 DIAGNOSIS — G894 Chronic pain syndrome: Secondary | ICD-10-CM | POA: Diagnosis not present

## 2020-05-15 DIAGNOSIS — K219 Gastro-esophageal reflux disease without esophagitis: Secondary | ICD-10-CM | POA: Diagnosis not present

## 2020-05-15 DIAGNOSIS — C8590 Non-Hodgkin lymphoma, unspecified, unspecified site: Secondary | ICD-10-CM | POA: Diagnosis not present

## 2020-05-15 DIAGNOSIS — Z8679 Personal history of other diseases of the circulatory system: Secondary | ICD-10-CM | POA: Diagnosis not present

## 2020-05-15 DIAGNOSIS — M1990 Unspecified osteoarthritis, unspecified site: Secondary | ICD-10-CM | POA: Diagnosis not present

## 2020-05-16 DIAGNOSIS — F0281 Dementia in other diseases classified elsewhere with behavioral disturbance: Secondary | ICD-10-CM | POA: Diagnosis not present

## 2020-05-16 DIAGNOSIS — G20 Parkinson's disease: Secondary | ICD-10-CM | POA: Diagnosis not present

## 2020-05-17 DIAGNOSIS — G20 Parkinson's disease: Secondary | ICD-10-CM | POA: Diagnosis not present

## 2020-05-17 DIAGNOSIS — F0391 Unspecified dementia with behavioral disturbance: Secondary | ICD-10-CM | POA: Diagnosis not present

## 2020-05-17 DIAGNOSIS — F0281 Dementia in other diseases classified elsewhere with behavioral disturbance: Secondary | ICD-10-CM | POA: Diagnosis not present

## 2020-05-17 DIAGNOSIS — Z8679 Personal history of other diseases of the circulatory system: Secondary | ICD-10-CM | POA: Diagnosis not present

## 2020-05-17 DIAGNOSIS — M1990 Unspecified osteoarthritis, unspecified site: Secondary | ICD-10-CM | POA: Diagnosis not present

## 2020-05-17 DIAGNOSIS — R634 Abnormal weight loss: Secondary | ICD-10-CM | POA: Diagnosis not present

## 2020-05-17 DIAGNOSIS — C8590 Non-Hodgkin lymphoma, unspecified, unspecified site: Secondary | ICD-10-CM | POA: Diagnosis not present

## 2020-05-17 DIAGNOSIS — R296 Repeated falls: Secondary | ICD-10-CM | POA: Diagnosis not present

## 2020-05-17 DIAGNOSIS — K219 Gastro-esophageal reflux disease without esophagitis: Secondary | ICD-10-CM | POA: Diagnosis not present

## 2020-05-17 DIAGNOSIS — K5900 Constipation, unspecified: Secondary | ICD-10-CM | POA: Diagnosis not present

## 2020-05-17 DIAGNOSIS — E782 Mixed hyperlipidemia: Secondary | ICD-10-CM | POA: Diagnosis not present

## 2020-05-17 DIAGNOSIS — Z973 Presence of spectacles and contact lenses: Secondary | ICD-10-CM | POA: Diagnosis not present

## 2020-05-17 DIAGNOSIS — G894 Chronic pain syndrome: Secondary | ICD-10-CM | POA: Diagnosis not present

## 2020-05-18 DIAGNOSIS — R296 Repeated falls: Secondary | ICD-10-CM | POA: Diagnosis not present

## 2020-05-18 DIAGNOSIS — G8929 Other chronic pain: Secondary | ICD-10-CM | POA: Diagnosis not present

## 2020-05-18 DIAGNOSIS — G20 Parkinson's disease: Secondary | ICD-10-CM | POA: Diagnosis not present

## 2020-05-18 DIAGNOSIS — K219 Gastro-esophageal reflux disease without esophagitis: Secondary | ICD-10-CM | POA: Diagnosis not present

## 2020-05-18 DIAGNOSIS — Z87440 Personal history of urinary (tract) infections: Secondary | ICD-10-CM | POA: Diagnosis not present

## 2020-05-18 DIAGNOSIS — Z993 Dependence on wheelchair: Secondary | ICD-10-CM | POA: Diagnosis not present

## 2020-05-18 DIAGNOSIS — Z9181 History of falling: Secondary | ICD-10-CM | POA: Diagnosis not present

## 2020-05-18 DIAGNOSIS — Z87891 Personal history of nicotine dependence: Secondary | ICD-10-CM | POA: Diagnosis not present

## 2020-05-18 DIAGNOSIS — F0281 Dementia in other diseases classified elsewhere with behavioral disturbance: Secondary | ICD-10-CM | POA: Diagnosis not present

## 2020-05-18 DIAGNOSIS — E785 Hyperlipidemia, unspecified: Secondary | ICD-10-CM | POA: Diagnosis not present

## 2020-05-18 DIAGNOSIS — C8590 Non-Hodgkin lymphoma, unspecified, unspecified site: Secondary | ICD-10-CM | POA: Diagnosis not present

## 2020-05-18 DIAGNOSIS — H9193 Unspecified hearing loss, bilateral: Secondary | ICD-10-CM | POA: Diagnosis not present

## 2020-05-18 DIAGNOSIS — K449 Diaphragmatic hernia without obstruction or gangrene: Secondary | ICD-10-CM | POA: Diagnosis not present

## 2020-05-19 DIAGNOSIS — G8929 Other chronic pain: Secondary | ICD-10-CM | POA: Diagnosis not present

## 2020-05-19 DIAGNOSIS — G20 Parkinson's disease: Secondary | ICD-10-CM | POA: Diagnosis not present

## 2020-05-19 DIAGNOSIS — R296 Repeated falls: Secondary | ICD-10-CM | POA: Diagnosis not present

## 2020-05-19 DIAGNOSIS — F0281 Dementia in other diseases classified elsewhere with behavioral disturbance: Secondary | ICD-10-CM | POA: Diagnosis not present

## 2020-05-19 DIAGNOSIS — K219 Gastro-esophageal reflux disease without esophagitis: Secondary | ICD-10-CM | POA: Diagnosis not present

## 2020-05-19 DIAGNOSIS — C8590 Non-Hodgkin lymphoma, unspecified, unspecified site: Secondary | ICD-10-CM | POA: Diagnosis not present

## 2020-05-21 DIAGNOSIS — G8929 Other chronic pain: Secondary | ICD-10-CM | POA: Diagnosis not present

## 2020-05-21 DIAGNOSIS — R296 Repeated falls: Secondary | ICD-10-CM | POA: Diagnosis not present

## 2020-05-21 DIAGNOSIS — K219 Gastro-esophageal reflux disease without esophagitis: Secondary | ICD-10-CM | POA: Diagnosis not present

## 2020-05-21 DIAGNOSIS — G20 Parkinson's disease: Secondary | ICD-10-CM | POA: Diagnosis not present

## 2020-05-21 DIAGNOSIS — C8590 Non-Hodgkin lymphoma, unspecified, unspecified site: Secondary | ICD-10-CM | POA: Diagnosis not present

## 2020-05-21 DIAGNOSIS — F0281 Dementia in other diseases classified elsewhere with behavioral disturbance: Secondary | ICD-10-CM | POA: Diagnosis not present

## 2020-05-22 DIAGNOSIS — G20 Parkinson's disease: Secondary | ICD-10-CM | POA: Diagnosis not present

## 2020-05-22 DIAGNOSIS — G8929 Other chronic pain: Secondary | ICD-10-CM | POA: Diagnosis not present

## 2020-05-22 DIAGNOSIS — F0281 Dementia in other diseases classified elsewhere with behavioral disturbance: Secondary | ICD-10-CM | POA: Diagnosis not present

## 2020-05-22 DIAGNOSIS — C8590 Non-Hodgkin lymphoma, unspecified, unspecified site: Secondary | ICD-10-CM | POA: Diagnosis not present

## 2020-05-22 DIAGNOSIS — K219 Gastro-esophageal reflux disease without esophagitis: Secondary | ICD-10-CM | POA: Diagnosis not present

## 2020-05-22 DIAGNOSIS — R296 Repeated falls: Secondary | ICD-10-CM | POA: Diagnosis not present

## 2020-05-23 DIAGNOSIS — K219 Gastro-esophageal reflux disease without esophagitis: Secondary | ICD-10-CM | POA: Diagnosis not present

## 2020-05-23 DIAGNOSIS — R296 Repeated falls: Secondary | ICD-10-CM | POA: Diagnosis not present

## 2020-05-23 DIAGNOSIS — G20 Parkinson's disease: Secondary | ICD-10-CM | POA: Diagnosis not present

## 2020-05-23 DIAGNOSIS — C8590 Non-Hodgkin lymphoma, unspecified, unspecified site: Secondary | ICD-10-CM | POA: Diagnosis not present

## 2020-05-23 DIAGNOSIS — F0281 Dementia in other diseases classified elsewhere with behavioral disturbance: Secondary | ICD-10-CM | POA: Diagnosis not present

## 2020-05-23 DIAGNOSIS — G8929 Other chronic pain: Secondary | ICD-10-CM | POA: Diagnosis not present

## 2020-05-27 DIAGNOSIS — M5136 Other intervertebral disc degeneration, lumbar region: Secondary | ICD-10-CM | POA: Diagnosis not present

## 2020-05-27 DIAGNOSIS — Z96652 Presence of left artificial knee joint: Secondary | ICD-10-CM | POA: Diagnosis not present

## 2020-05-28 ENCOUNTER — Other Ambulatory Visit: Payer: Self-pay

## 2020-05-28 ENCOUNTER — Ambulatory Visit (INDEPENDENT_AMBULATORY_CARE_PROVIDER_SITE_OTHER): Payer: Medicare Other | Admitting: Internal Medicine

## 2020-05-28 DIAGNOSIS — M170 Bilateral primary osteoarthritis of knee: Secondary | ICD-10-CM | POA: Diagnosis not present

## 2020-05-28 DIAGNOSIS — K219 Gastro-esophageal reflux disease without esophagitis: Secondary | ICD-10-CM | POA: Diagnosis not present

## 2020-05-28 DIAGNOSIS — G20 Parkinson's disease: Secondary | ICD-10-CM | POA: Diagnosis not present

## 2020-05-28 DIAGNOSIS — I10 Essential (primary) hypertension: Secondary | ICD-10-CM | POA: Diagnosis not present

## 2020-05-28 DIAGNOSIS — R410 Disorientation, unspecified: Secondary | ICD-10-CM

## 2020-05-28 DIAGNOSIS — Z23 Encounter for immunization: Secondary | ICD-10-CM | POA: Diagnosis not present

## 2020-05-28 DIAGNOSIS — R296 Repeated falls: Secondary | ICD-10-CM | POA: Diagnosis not present

## 2020-05-28 DIAGNOSIS — G8929 Other chronic pain: Secondary | ICD-10-CM | POA: Diagnosis not present

## 2020-05-28 DIAGNOSIS — C8590 Non-Hodgkin lymphoma, unspecified, unspecified site: Secondary | ICD-10-CM | POA: Diagnosis not present

## 2020-05-28 DIAGNOSIS — F0281 Dementia in other diseases classified elsewhere with behavioral disturbance: Secondary | ICD-10-CM | POA: Diagnosis not present

## 2020-05-30 DIAGNOSIS — C8590 Non-Hodgkin lymphoma, unspecified, unspecified site: Secondary | ICD-10-CM | POA: Diagnosis not present

## 2020-05-30 DIAGNOSIS — F0281 Dementia in other diseases classified elsewhere with behavioral disturbance: Secondary | ICD-10-CM | POA: Diagnosis not present

## 2020-05-30 DIAGNOSIS — K219 Gastro-esophageal reflux disease without esophagitis: Secondary | ICD-10-CM | POA: Diagnosis not present

## 2020-05-30 DIAGNOSIS — R296 Repeated falls: Secondary | ICD-10-CM | POA: Diagnosis not present

## 2020-05-30 DIAGNOSIS — G20 Parkinson's disease: Secondary | ICD-10-CM | POA: Diagnosis not present

## 2020-05-30 DIAGNOSIS — G8929 Other chronic pain: Secondary | ICD-10-CM | POA: Diagnosis not present

## 2020-06-02 ENCOUNTER — Encounter: Payer: Self-pay | Admitting: Internal Medicine

## 2020-06-02 DIAGNOSIS — I10 Essential (primary) hypertension: Secondary | ICD-10-CM | POA: Insufficient documentation

## 2020-06-02 DIAGNOSIS — Z23 Encounter for immunization: Secondary | ICD-10-CM | POA: Insufficient documentation

## 2020-06-02 NOTE — Progress Notes (Signed)
Established Patient Office Visit  Subjective:  Patient ID: Sarah Donaldson, female    DOB: 07-Sep-1950  Age: 69 y.o. MRN: [id number]  CC:  Chief Complaint  Patient presents with  . Hospitalization Follow-up    HPI   Sarah Donaldson presents for for follow-up from the hospital.  Patient was admitted for more than 2 weeks.  Patient was taken care of by psychiatrist as well as by her neurologist.  She is known to have dementia mood disorder increasing lethargy.  She is known to have parkinsonian disease.  Patient has improved a lot on the inpatient management.  Past Medical History:  Diagnosis Date  . Cancer ([place])    Non-hodgkin's lymphoma (in remission)  . GERD (gastroesophageal reflux disease)    well-controlled w/ omeprazole  . Hyperlipidemia   . Knee joint cyst, left 02/07/2018  . Parkinson disease ([place])   . Parkinson disease [facility])     Past Surgical History:  Procedure Laterality Date  . BACK SURGERY    . BUNIONECTOMY    . NASAL SEPTUM SURGERY    . RETINAL DETACHMENT SURGERY    . TOTAL KNEE ARTHROPLASTY Left 06/07/2019   Procedure: TOTAL KNEE ARTHROPLASTY;  Surgeon: Lovell Sheehan, MD;  Location: [facility] ORS;  Service: Orthopedics;  Laterality: Left;    Family History  Problem Relation Age of Onset  . Heart disease Mother   . Alcohol abuse Father   . Breast cancer Neg Hx   . Mental illness Neg Hx     Social History   Socioeconomic History  . Marital status: Married    Spouse name: Lene Mckay  . Number of children: 4  . Years of education: Not on file  . Highest education level: Not on file  Occupational History    Comment: retired  Tobacco Use  . Smoking status: Former Research scientist (life sciences)  . Smokeless tobacco: Never Used  Vaping Use  . Vaping Use: Never used  Substance and Sexual Activity  . Alcohol use: Yes    Alcohol/week: 0.0 standard drinks  . Drug use: No  . Sexual activity: Not Currently  Other Topics Concern  . Not on file  Social History Narrative  .  Not on file   Social Determinants of Health   Financial Resource Strain:   . Difficulty of Paying Living Expenses: Not on file  Food Insecurity:   . Worried About Charity fundraiser in the Last Year: Not on file  . Ran Out of Food in the Last Year: Not on file  Transportation Needs:   . Lack of Transportation (Medical): Not on file  . Lack of Transportation (Non-Medical): Not on file  Physical Activity:   . Days of Exercise per Week: Not on file  . Minutes of Exercise per Session: Not on file  Stress:   . Feeling of Stress : Not on file  Social Connections:   . Frequency of Communication with Friends and Family: Not on file  . Frequency of Social Gatherings with Friends and Family: Not on file  . Attends Religious Services: Not on file  . Active Member of Clubs or Organizations: Not on file  . Attends Archivist Meetings: Not on file  . Marital Status: Not on file  Intimate Partner Violence:   . Fear of Current or Ex-Partner: Not on file  . Emotionally Abused: Not on file  . Physically Abused: Not on file  . Sexually Abused: Not on file     Current  Outpatient Medications:  .  acetaminophen (TYLENOL) 500 MG tablet, Take 500 mg by mouth 2 (two) times daily. , Disp: , Rfl:  .  ALPRAZolam (XANAX) 0.25 MG tablet, Take 0.25 mg by mouth 2 (two) times daily as needed., Disp: , Rfl:  .  Carbidopa-Levodopa ER (SINEMET CR) 25-100 MG tablet controlled release, TAKE 2 EVERY 2 HOURS TAKE AN EXTRA 1-2 TABLETS AT NIGHT FOR CHEST TIGHTNESS, Disp: , Rfl:  .  clonazePAM (KLONOPIN) 2 MG tablet, Take 1 mg by mouth 4 (four) times daily., Disp: , Rfl:  .  entacapone (COMTAN) 200 MG tablet, Take 1 tablet (200 mg total) by mouth 4 (four) times daily., Disp: 120 tablet, Rfl: 0 .  escitalopram (LEXAPRO) 10 MG tablet, Take 10 mg by mouth daily., Disp: , Rfl:  .  HYDROcodone-acetaminophen (NORCO/VICODIN) 5-325 MG tablet, Take by mouth. , Disp: , Rfl:  .  melatonin 5 MG TABS, Take 1 tablet (5 mg  total) by mouth at bedtime., Disp: , Rfl: 0 .  neomycin-polymyxin-dexamethasone (MAXITROL) 0.1 % ophthalmic suspension, Place 1 drop into the left eye daily. , Disp: , Rfl:  .  omeprazole (PRILOSEC) 20 MG capsule, Take 1 capsule (20 mg total) by mouth daily., Disp: 90 capsule, Rfl: 0 .  potassium chloride (KLOR-CON) 10 MEQ tablet, TAKE 1 TABLET BY MOUTH EVERY DAY, Disp: 30 tablet, Rfl: 1 .  prednisoLONE acetate (PRED FORTE) 1 % ophthalmic suspension, Place 1 drop into the left eye 4 (four) times daily. , Disp: , Rfl:  .  pregabalin (LYRICA) 100 MG capsule, Take 100 mg by mouth 3 (three) times daily., Disp: , Rfl:  .  QUEtiapine (SEROQUEL) 100 MG tablet, Take 100 mg by mouth at bedtime., Disp: , Rfl:  .  QUEtiapine (SEROQUEL) 50 MG tablet, Take 50 mg by mouth 3 (three) times daily., Disp: , Rfl:  .  traZODone (DESYREL) 50 MG tablet, Take 25-50 mg by mouth at bedtime. , Disp: , Rfl:  .  trifluoperazine (STELAZINE) 1 MG tablet, Take 1 tablet (1 mg total) by mouth 2 (two) times daily., Disp: 30 tablet, Rfl: 1 .  pantoprazole (PROTONIX) 20 MG tablet, TAKE 1 TABLET BY MOUTH EVERY DAY, Disp: 30 tablet, Rfl: 1   Allergies  Allergen Reactions  . Percocet [Oxycodone-Acetaminophen]     Hallucination  . Baclofen     Caused numbness in mouth   . Codeine Sulfate Nausea And Vomiting  . Gabapentin Swelling    Mouth swelling   . Terazosin   . Tramadol Hcl     Interferes with Serotonin levels which affects her Parkinsons    ROS Review of Systems  Constitutional: Positive for fatigue. Negative for appetite change and chills.  HENT: Negative for congestion, mouth sores and sinus pressure.   Eyes: Negative for itching.  Respiratory: Negative for chest tightness.   Cardiovascular: Negative for chest pain.  Gastrointestinal: Negative for blood in stool.  Genitourinary: Negative for frequency.  Musculoskeletal: Positive for gait problem. Negative for neck stiffness.  Neurological: Positive for speech  difficulty. Negative for light-headedness.  Psychiatric/Behavioral: Positive for behavioral problems, confusion, decreased concentration and self-injury. The patient is nervous/anxious and is hyperactive.       Objective:    Physical Exam Vitals reviewed.  Constitutional:      Appearance: She is ill-appearing.  HENT:     Mouth/Throat:     Mouth: Mucous membranes are moist.  Eyes:     Pupils: Pupils are equal, round, and reactive to light.  Neck:  Vascular: No carotid bruit.  Cardiovascular:     Rate and Rhythm: Normal rate and regular rhythm.     Pulses: Normal pulses.     Heart sounds: Normal heart sounds.  Pulmonary:     Effort: Pulmonary effort is normal.     Breath sounds: Normal breath sounds.  Abdominal:     General: Bowel sounds are normal.     Palpations: Abdomen is soft. There is no hepatomegaly, splenomegaly or mass.     Tenderness: There is no abdominal tenderness.     Hernia: No hernia is present.  Musculoskeletal:        General: No tenderness.     Cervical back: Neck supple.     Right lower leg: No edema.     Left lower leg: No edema.  Skin:    Findings: No rash.  Neurological:     Mental Status: Mental status is at baseline.     Motor: Weakness present.     Gait: Gait abnormal.  Psychiatric:        Attention and Perception: She is inattentive.        Mood and Affect: Mood is anxious. Affect is labile and inappropriate.        Speech: She is noncommunicative.        Behavior: Behavior is uncooperative, withdrawn and hyperactive. Behavior is not combative.        Thought Content: Thought content is paranoid.        Cognition and Memory: Cognition is impaired. Memory is impaired.        Judgment: Judgment is impulsive.     There were no vitals taken for this visit. Wt Readings from Last 3 Encounters:  04/10/20 149 lb 14.6 oz (68 kg)  03/28/20 150 lb (68 kg)  02/16/20 145 lb 1 oz (65.8 kg)     Health Maintenance Due  Topic Date Due  .  Hepatitis C Screening  Never done  . TETANUS/TDAP  Never done  . COLONOSCOPY  Never done  . DEXA SCAN  Never done  . PNA vac Low Risk Adult (1 of 2 - PCV13) Never done  . MAMMOGRAM  05/10/2020    There are no preventive care reminders to display for this patient.  Lab Results  Component Value Date   TSH 1.778 09/24/2019   Lab Results  Component Value Date   WBC 7.4 04/10/2020   HGB 11.6 (L) 04/10/2020   HCT 36.2 04/10/2020   MCV 87.9 04/10/2020   PLT 174 04/10/2020   Lab Results  Component Value Date   NA 140 04/10/2020   K 3.4 (L) 04/10/2020   CO2 24 04/10/2020   GLUCOSE 102 (H) 04/10/2020   BUN 23 04/10/2020   CREATININE 0.58 04/10/2020   BILITOT 1.4 (H) 04/10/2020   ALKPHOS 89 04/10/2020   AST 13 (L) 04/10/2020   ALT <5 04/10/2020   PROT 6.1 (L) 04/10/2020   ALBUMIN 3.9 04/10/2020   CALCIUM 8.7 (L) 04/10/2020   ANIONGAP 11 04/10/2020   No results found for: CHOL No results found for: HDL No results found for: LDLCALC No results found for: TRIG No results found for: CHOLHDL No results found for: HGBA1C    Assessment & Plan:   Problem List Items Addressed This Visit      Cardiovascular and Mediastinum   Essential hypertension    Patient blood pressure is under control on present medication.        Musculoskeletal and Integument   Osteoarthritis of  knee    Patient has a primary osteoarthritis of both knees which has got worse after frequent falls.  On today's examination she does not complain of any knee pain.  She is wheelchair-bound and quite restless but markedly improved according to her husband.        Other   Altered mental status    Patient has problems with restless dementia and delusions.  She is wheelchair-bound and needs a 24-hour care.Marland Kitchen  Her husband is only a caregiver.  I told him to try to see whether he can put her on a nursing home but she is not agreeable for that.      Need for influenza vaccination - Primary    Patient was  advised to have a flu shot.      Relevant Orders   Flu Vaccine QUAD High Dose(Fluad) (Completed)      No orders of the defined types were placed in this encounter.   Follow-up: No follow-ups on file.    Cletis Athens, MD

## 2020-06-02 NOTE — Assessment & Plan Note (Signed)
Patient blood pressure is under control on present medication.

## 2020-06-02 NOTE — Assessment & Plan Note (Signed)
Patient has a primary osteoarthritis of both knees which has got worse after frequent falls.  On today's examination she does not complain of any knee pain.  She is wheelchair-bound and quite restless but markedly improved according to her husband.

## 2020-06-02 NOTE — Assessment & Plan Note (Signed)
Patient has problems with restless dementia and delusions.  She is wheelchair-bound and needs a 24-hour care.Marland Kitchen  Her husband is only a caregiver.  I told him to try to see whether he can put her on a nursing home but she is not agreeable for that.

## 2020-06-02 NOTE — Assessment & Plan Note (Signed)
Patient was advised to have a flu shot.

## 2020-06-03 DIAGNOSIS — H2511 Age-related nuclear cataract, right eye: Secondary | ICD-10-CM | POA: Diagnosis not present

## 2020-06-11 DIAGNOSIS — R296 Repeated falls: Secondary | ICD-10-CM | POA: Diagnosis not present

## 2020-06-11 DIAGNOSIS — M545 Low back pain, unspecified: Secondary | ICD-10-CM | POA: Diagnosis not present

## 2020-06-11 DIAGNOSIS — R202 Paresthesia of skin: Secondary | ICD-10-CM | POA: Diagnosis not present

## 2020-06-11 DIAGNOSIS — C8590 Non-Hodgkin lymphoma, unspecified, unspecified site: Secondary | ICD-10-CM | POA: Diagnosis not present

## 2020-06-11 DIAGNOSIS — K219 Gastro-esophageal reflux disease without esophagitis: Secondary | ICD-10-CM | POA: Diagnosis not present

## 2020-06-11 DIAGNOSIS — F060 Psychotic disorder with hallucinations due to known physiological condition: Secondary | ICD-10-CM | POA: Diagnosis not present

## 2020-06-11 DIAGNOSIS — R200 Anesthesia of skin: Secondary | ICD-10-CM | POA: Diagnosis not present

## 2020-06-11 DIAGNOSIS — G20 Parkinson's disease: Secondary | ICD-10-CM | POA: Diagnosis not present

## 2020-06-11 DIAGNOSIS — G8929 Other chronic pain: Secondary | ICD-10-CM | POA: Diagnosis not present

## 2020-06-11 DIAGNOSIS — F0281 Dementia in other diseases classified elsewhere with behavioral disturbance: Secondary | ICD-10-CM | POA: Diagnosis not present

## 2020-06-11 DIAGNOSIS — R251 Tremor, unspecified: Secondary | ICD-10-CM | POA: Diagnosis not present

## 2020-06-11 DIAGNOSIS — G4752 REM sleep behavior disorder: Secondary | ICD-10-CM | POA: Diagnosis not present

## 2020-06-12 DIAGNOSIS — C8590 Non-Hodgkin lymphoma, unspecified, unspecified site: Secondary | ICD-10-CM | POA: Diagnosis not present

## 2020-06-12 DIAGNOSIS — G8929 Other chronic pain: Secondary | ICD-10-CM | POA: Diagnosis not present

## 2020-06-12 DIAGNOSIS — G20 Parkinson's disease: Secondary | ICD-10-CM | POA: Diagnosis not present

## 2020-06-12 DIAGNOSIS — F0281 Dementia in other diseases classified elsewhere with behavioral disturbance: Secondary | ICD-10-CM | POA: Diagnosis not present

## 2020-06-12 DIAGNOSIS — R296 Repeated falls: Secondary | ICD-10-CM | POA: Diagnosis not present

## 2020-06-12 DIAGNOSIS — K219 Gastro-esophageal reflux disease without esophagitis: Secondary | ICD-10-CM | POA: Diagnosis not present

## 2020-06-13 DIAGNOSIS — R296 Repeated falls: Secondary | ICD-10-CM | POA: Diagnosis not present

## 2020-06-13 DIAGNOSIS — G20 Parkinson's disease: Secondary | ICD-10-CM | POA: Diagnosis not present

## 2020-06-13 DIAGNOSIS — G8929 Other chronic pain: Secondary | ICD-10-CM | POA: Diagnosis not present

## 2020-06-13 DIAGNOSIS — C8590 Non-Hodgkin lymphoma, unspecified, unspecified site: Secondary | ICD-10-CM | POA: Diagnosis not present

## 2020-06-13 DIAGNOSIS — K219 Gastro-esophageal reflux disease without esophagitis: Secondary | ICD-10-CM | POA: Diagnosis not present

## 2020-06-13 DIAGNOSIS — F0281 Dementia in other diseases classified elsewhere with behavioral disturbance: Secondary | ICD-10-CM | POA: Diagnosis not present

## 2020-06-17 DIAGNOSIS — Z87891 Personal history of nicotine dependence: Secondary | ICD-10-CM | POA: Diagnosis not present

## 2020-06-17 DIAGNOSIS — Z9181 History of falling: Secondary | ICD-10-CM | POA: Diagnosis not present

## 2020-06-17 DIAGNOSIS — C8590 Non-Hodgkin lymphoma, unspecified, unspecified site: Secondary | ICD-10-CM | POA: Diagnosis not present

## 2020-06-17 DIAGNOSIS — Z993 Dependence on wheelchair: Secondary | ICD-10-CM | POA: Diagnosis not present

## 2020-06-17 DIAGNOSIS — F0281 Dementia in other diseases classified elsewhere with behavioral disturbance: Secondary | ICD-10-CM | POA: Diagnosis not present

## 2020-06-17 DIAGNOSIS — G20 Parkinson's disease: Secondary | ICD-10-CM | POA: Diagnosis not present

## 2020-06-17 DIAGNOSIS — K449 Diaphragmatic hernia without obstruction or gangrene: Secondary | ICD-10-CM | POA: Diagnosis not present

## 2020-06-17 DIAGNOSIS — K219 Gastro-esophageal reflux disease without esophagitis: Secondary | ICD-10-CM | POA: Diagnosis not present

## 2020-06-17 DIAGNOSIS — R296 Repeated falls: Secondary | ICD-10-CM | POA: Diagnosis not present

## 2020-06-17 DIAGNOSIS — Z87440 Personal history of urinary (tract) infections: Secondary | ICD-10-CM | POA: Diagnosis not present

## 2020-06-17 DIAGNOSIS — E785 Hyperlipidemia, unspecified: Secondary | ICD-10-CM | POA: Diagnosis not present

## 2020-06-17 DIAGNOSIS — G8929 Other chronic pain: Secondary | ICD-10-CM | POA: Diagnosis not present

## 2020-06-17 DIAGNOSIS — H9193 Unspecified hearing loss, bilateral: Secondary | ICD-10-CM | POA: Diagnosis not present

## 2020-06-18 DIAGNOSIS — K219 Gastro-esophageal reflux disease without esophagitis: Secondary | ICD-10-CM | POA: Diagnosis not present

## 2020-06-18 DIAGNOSIS — C8590 Non-Hodgkin lymphoma, unspecified, unspecified site: Secondary | ICD-10-CM | POA: Diagnosis not present

## 2020-06-18 DIAGNOSIS — R296 Repeated falls: Secondary | ICD-10-CM | POA: Diagnosis not present

## 2020-06-18 DIAGNOSIS — G20 Parkinson's disease: Secondary | ICD-10-CM | POA: Diagnosis not present

## 2020-06-18 DIAGNOSIS — F0281 Dementia in other diseases classified elsewhere with behavioral disturbance: Secondary | ICD-10-CM | POA: Diagnosis not present

## 2020-06-18 DIAGNOSIS — G8929 Other chronic pain: Secondary | ICD-10-CM | POA: Diagnosis not present

## 2020-06-21 DIAGNOSIS — G8929 Other chronic pain: Secondary | ICD-10-CM | POA: Diagnosis not present

## 2020-06-21 DIAGNOSIS — R296 Repeated falls: Secondary | ICD-10-CM | POA: Diagnosis not present

## 2020-06-21 DIAGNOSIS — K219 Gastro-esophageal reflux disease without esophagitis: Secondary | ICD-10-CM | POA: Diagnosis not present

## 2020-06-21 DIAGNOSIS — G20 Parkinson's disease: Secondary | ICD-10-CM | POA: Diagnosis not present

## 2020-06-21 DIAGNOSIS — C8590 Non-Hodgkin lymphoma, unspecified, unspecified site: Secondary | ICD-10-CM | POA: Diagnosis not present

## 2020-06-21 DIAGNOSIS — F0281 Dementia in other diseases classified elsewhere with behavioral disturbance: Secondary | ICD-10-CM | POA: Diagnosis not present

## 2020-06-25 DIAGNOSIS — C8590 Non-Hodgkin lymphoma, unspecified, unspecified site: Secondary | ICD-10-CM | POA: Diagnosis not present

## 2020-06-25 DIAGNOSIS — G8929 Other chronic pain: Secondary | ICD-10-CM | POA: Diagnosis not present

## 2020-06-25 DIAGNOSIS — F0281 Dementia in other diseases classified elsewhere with behavioral disturbance: Secondary | ICD-10-CM | POA: Diagnosis not present

## 2020-06-25 DIAGNOSIS — G20 Parkinson's disease: Secondary | ICD-10-CM | POA: Diagnosis not present

## 2020-06-25 DIAGNOSIS — K219 Gastro-esophageal reflux disease without esophagitis: Secondary | ICD-10-CM | POA: Diagnosis not present

## 2020-06-25 DIAGNOSIS — R296 Repeated falls: Secondary | ICD-10-CM | POA: Diagnosis not present

## 2020-06-27 DIAGNOSIS — K219 Gastro-esophageal reflux disease without esophagitis: Secondary | ICD-10-CM | POA: Diagnosis not present

## 2020-06-27 DIAGNOSIS — C8590 Non-Hodgkin lymphoma, unspecified, unspecified site: Secondary | ICD-10-CM | POA: Diagnosis not present

## 2020-06-27 DIAGNOSIS — G20 Parkinson's disease: Secondary | ICD-10-CM | POA: Diagnosis not present

## 2020-06-27 DIAGNOSIS — G8929 Other chronic pain: Secondary | ICD-10-CM | POA: Diagnosis not present

## 2020-06-27 DIAGNOSIS — R296 Repeated falls: Secondary | ICD-10-CM | POA: Diagnosis not present

## 2020-06-27 DIAGNOSIS — F0281 Dementia in other diseases classified elsewhere with behavioral disturbance: Secondary | ICD-10-CM | POA: Diagnosis not present

## 2020-07-02 ENCOUNTER — Ambulatory Visit: Payer: No Typology Code available for payment source | Admitting: Internal Medicine

## 2020-07-02 DIAGNOSIS — R200 Anesthesia of skin: Secondary | ICD-10-CM | POA: Diagnosis not present

## 2020-07-02 DIAGNOSIS — G20 Parkinson's disease: Secondary | ICD-10-CM | POA: Diagnosis not present

## 2020-07-02 DIAGNOSIS — G4752 REM sleep behavior disorder: Secondary | ICD-10-CM | POA: Diagnosis not present

## 2020-07-02 DIAGNOSIS — R251 Tremor, unspecified: Secondary | ICD-10-CM | POA: Diagnosis not present

## 2020-07-02 DIAGNOSIS — R440 Auditory hallucinations: Secondary | ICD-10-CM | POA: Diagnosis not present

## 2020-07-02 DIAGNOSIS — M545 Low back pain, unspecified: Secondary | ICD-10-CM | POA: Diagnosis not present

## 2020-07-02 DIAGNOSIS — R42 Dizziness and giddiness: Secondary | ICD-10-CM | POA: Diagnosis not present

## 2020-07-02 DIAGNOSIS — R202 Paresthesia of skin: Secondary | ICD-10-CM | POA: Diagnosis not present

## 2020-07-03 DIAGNOSIS — F0281 Dementia in other diseases classified elsewhere with behavioral disturbance: Secondary | ICD-10-CM | POA: Diagnosis not present

## 2020-07-03 DIAGNOSIS — K219 Gastro-esophageal reflux disease without esophagitis: Secondary | ICD-10-CM | POA: Diagnosis not present

## 2020-07-03 DIAGNOSIS — G20 Parkinson's disease: Secondary | ICD-10-CM | POA: Diagnosis not present

## 2020-07-03 DIAGNOSIS — G8929 Other chronic pain: Secondary | ICD-10-CM | POA: Diagnosis not present

## 2020-07-03 DIAGNOSIS — R296 Repeated falls: Secondary | ICD-10-CM | POA: Diagnosis not present

## 2020-07-03 DIAGNOSIS — C8590 Non-Hodgkin lymphoma, unspecified, unspecified site: Secondary | ICD-10-CM | POA: Diagnosis not present

## 2020-07-09 DIAGNOSIS — F0281 Dementia in other diseases classified elsewhere with behavioral disturbance: Secondary | ICD-10-CM | POA: Diagnosis not present

## 2020-07-09 DIAGNOSIS — G8929 Other chronic pain: Secondary | ICD-10-CM | POA: Diagnosis not present

## 2020-07-09 DIAGNOSIS — G20 Parkinson's disease: Secondary | ICD-10-CM | POA: Diagnosis not present

## 2020-07-09 DIAGNOSIS — K219 Gastro-esophageal reflux disease without esophagitis: Secondary | ICD-10-CM | POA: Diagnosis not present

## 2020-07-09 DIAGNOSIS — R296 Repeated falls: Secondary | ICD-10-CM | POA: Diagnosis not present

## 2020-07-09 DIAGNOSIS — C8590 Non-Hodgkin lymphoma, unspecified, unspecified site: Secondary | ICD-10-CM | POA: Diagnosis not present

## 2020-07-15 ENCOUNTER — Ambulatory Visit: Payer: Medicare Other | Attending: Internal Medicine

## 2020-07-15 DIAGNOSIS — Z23 Encounter for immunization: Secondary | ICD-10-CM

## 2020-07-15 NOTE — Progress Notes (Signed)
   Covid-19 Vaccination Clinic  Name:  ILINE BUCHINGER    MRN: [id number] DOB: Feb 11, 1951  07/15/2020  Ms. Vallo was observed post Covid-19 immunization for 15 minutes without incident. She was provided with Vaccine Information Sheet and instruction to access the V-Safe system.   Ms. Manahan was instructed to call 911 with any severe reactions post vaccine: Marland Kitchen Difficulty breathing  . Swelling of face and throat  . A fast heartbeat  . A bad rash all over body  . Dizziness and weakness   Immunizations Administered    No immunizations on file.

## 2020-07-16 ENCOUNTER — Other Ambulatory Visit: Payer: Self-pay

## 2020-07-16 ENCOUNTER — Ambulatory Visit (INDEPENDENT_AMBULATORY_CARE_PROVIDER_SITE_OTHER): Payer: Medicare Other | Admitting: Podiatry

## 2020-07-16 DIAGNOSIS — K219 Gastro-esophageal reflux disease without esophagitis: Secondary | ICD-10-CM | POA: Diagnosis not present

## 2020-07-16 DIAGNOSIS — G8929 Other chronic pain: Secondary | ICD-10-CM | POA: Diagnosis not present

## 2020-07-16 DIAGNOSIS — C8590 Non-Hodgkin lymphoma, unspecified, unspecified site: Secondary | ICD-10-CM | POA: Diagnosis not present

## 2020-07-16 DIAGNOSIS — B353 Tinea pedis: Secondary | ICD-10-CM | POA: Diagnosis not present

## 2020-07-16 DIAGNOSIS — M79675 Pain in left toe(s): Secondary | ICD-10-CM | POA: Diagnosis not present

## 2020-07-16 DIAGNOSIS — R296 Repeated falls: Secondary | ICD-10-CM | POA: Diagnosis not present

## 2020-07-16 DIAGNOSIS — B351 Tinea unguium: Secondary | ICD-10-CM | POA: Diagnosis not present

## 2020-07-16 DIAGNOSIS — M79674 Pain in right toe(s): Secondary | ICD-10-CM

## 2020-07-16 DIAGNOSIS — G20 Parkinson's disease: Secondary | ICD-10-CM | POA: Diagnosis not present

## 2020-07-16 DIAGNOSIS — F0281 Dementia in other diseases classified elsewhere with behavioral disturbance: Secondary | ICD-10-CM | POA: Diagnosis not present

## 2020-07-16 MED ORDER — CLOTRIMAZOLE-BETAMETHASONE 1-0.05 % EX CREA: 1.0000 "application " | TOPICAL_CREAM | Freq: Two times a day (BID) | CUTANEOUS | 3 refills | Status: DC

## 2020-07-16 NOTE — Progress Notes (Signed)
   HPI: 69 y.o. female presenting today for evaluation of onychomycosis to the bilateral toenails.  Patient also experiences itching and burning sensations to the bilateral feet.  This been ongoing for several years.  Currently she is applying tea tree oil to the nails to try to improve the symptoms.  No improvement.  She presents for further treatment evaluation with her husband today  Past Medical History:  Diagnosis Date  . Cancer ([place])    Non-hodgkin's lymphoma (in remission)  . GERD (gastroesophageal reflux disease)    well-controlled w/ omeprazole  . Hyperlipidemia   . Knee joint cyst, left 02/07/2018  . Parkinson disease ([place])   . Parkinson disease [facility])      Physical Exam: General: The patient is alert and oriented x3 in no acute distress.  Dermatology: Skin is warm, dry and supple bilateral lower extremities. Negative for open lesions or macerations.  Hyperkeratotic nails noted especially to the bilateral hallux nail plates.  Findings consistent with onychomycosis of the toenails.  There is also some dermatitis and erythema specially to the interdigital areas weightbearing surfaces of the feet bilateral.  Vascular: Palpable pedal pulses bilaterally. No edema or erythema noted. Capillary refill within normal limits.  Neurological: Epicritic and protective threshold grossly intact bilaterally.   Musculoskeletal Exam: Patient minimally ambulatory secondary to history of Parkinson's disease.  Muscle strength decreased  Assessment: 1.  Onychomycosis of toenails bilateral great toes 2.  Tinea pedis bilateral   Plan of Care:  1. Patient evaluated.  2.  The patient has had blood work, comprehensive metabolic panel, most recently 04/10/2020.  At this time results were abnormal.  Also prior to that on 03/27/2020 BMP abnormal. 3.  Do not recommend oral Lamisil as to potential hepatic impairment 4.  Prescription for Lotrisone cream apply 2 times daily 5. OTC tolcylen antifungal topical  dispensed.  Apply daily 6.  Return to clinic as needed  *I treat her husband as well      Edrick Kins, DPM [facility]  Dr. Edrick Kins, DPM    [address],  [address]                Office [phone]  Fax [phone]

## 2020-07-18 DIAGNOSIS — M47896 Other spondylosis, lumbar region: Secondary | ICD-10-CM | POA: Diagnosis not present

## 2020-07-18 DIAGNOSIS — H0011 Chalazion right upper eyelid: Secondary | ICD-10-CM | POA: Diagnosis not present

## 2020-07-18 DIAGNOSIS — Z96652 Presence of left artificial knee joint: Secondary | ICD-10-CM | POA: Diagnosis not present

## 2020-07-23 ENCOUNTER — Ambulatory Visit (INDEPENDENT_AMBULATORY_CARE_PROVIDER_SITE_OTHER): Payer: Medicare Other | Admitting: Internal Medicine

## 2020-07-23 ENCOUNTER — Other Ambulatory Visit: Payer: Self-pay

## 2020-07-23 ENCOUNTER — Encounter: Payer: Self-pay | Admitting: Internal Medicine

## 2020-07-23 VITALS — BP 104/66 | HR 70

## 2020-07-23 DIAGNOSIS — F0281 Dementia in other diseases classified elsewhere with behavioral disturbance: Secondary | ICD-10-CM

## 2020-07-23 DIAGNOSIS — F22 Delusional disorders: Secondary | ICD-10-CM | POA: Diagnosis not present

## 2020-07-23 DIAGNOSIS — F064 Anxiety disorder due to known physiological condition: Secondary | ICD-10-CM

## 2020-07-23 DIAGNOSIS — M170 Bilateral primary osteoarthritis of knee: Secondary | ICD-10-CM | POA: Diagnosis not present

## 2020-07-23 DIAGNOSIS — M47816 Spondylosis without myelopathy or radiculopathy, lumbar region: Secondary | ICD-10-CM | POA: Diagnosis not present

## 2020-07-23 DIAGNOSIS — G20 Parkinson's disease: Secondary | ICD-10-CM

## 2020-07-23 NOTE — Progress Notes (Signed)
Established Patient Office Visit  Subjective:  Patient ID: Sarah Donaldson, female    DOB: [birth year]  Age: 69 y.o. MRN: [id number]  CC:  Chief Complaint  Patient presents with  . Follow-up    HPI  Sarah Donaldson presents for check up patient is getting worse.  She is wheelchair-bound and very restless.  Her husband is only permanent caretaker.  I will try to get her hospice to help him take care of her.  Past Medical History:  Diagnosis Date  . Cancer ([place])    Non-hodgkin's lymphoma (in remission)  . GERD (gastroesophageal reflux disease)    well-controlled w/ omeprazole  . Hyperlipidemia   . Knee joint cyst, left 02/07/2018  . Parkinson disease ([place])   . Parkinson disease [facility])     Past Surgical History:  Procedure Laterality Date  . BACK SURGERY    . BUNIONECTOMY    . NASAL SEPTUM SURGERY    . RETINAL DETACHMENT SURGERY    . TOTAL KNEE ARTHROPLASTY Left 06/07/2019   Procedure: TOTAL KNEE ARTHROPLASTY;  Surgeon: Lovell Sheehan, MD;  Location: [facility] ORS;  Service: Orthopedics;  Laterality: Left;    Family History  Problem Relation Age of Onset  . Heart disease Mother   . Alcohol abuse Father   . Breast cancer Neg Hx   . Mental illness Neg Hx     Social History   Socioeconomic History  . Marital status: Married    Spouse name: Jaidynn Balster  . Number of children: 4  . Years of education: Not on file  . Highest education level: Not on file  Occupational History    Comment: retired  Tobacco Use  . Smoking status: Former Research scientist (life sciences)  . Smokeless tobacco: Never Used  Vaping Use  . Vaping Use: Never used  Substance and Sexual Activity  . Alcohol use: Yes    Alcohol/week: 0.0 standard drinks  . Drug use: No  . Sexual activity: Not Currently  Other Topics Concern  . Not on file  Social History Narrative  . Not on file   Social Determinants of Health   Financial Resource Strain: Not on file  Food Insecurity: Not on file  Transportation Needs: Not on file   Physical Activity: Not on file  Stress: Not on file  Social Connections: Not on file  Intimate Partner Violence: Not on file     Current Outpatient Medications:  .  acetaminophen (TYLENOL) 500 MG tablet, Take 500 mg by mouth 2 (two) times daily. , Disp: , Rfl:  .  ALPRAZolam (XANAX) 0.25 MG tablet, Take 0.25 mg by mouth 2 (two) times daily as needed., Disp: , Rfl:  .  Carbidopa-Levodopa ER (SINEMET CR) 25-100 MG tablet controlled release, TAKE 2 EVERY 2 HOURS TAKE AN EXTRA 1-2 TABLETS AT NIGHT FOR CHEST TIGHTNESS, Disp: , Rfl:  .  clonazePAM (KLONOPIN) 2 MG tablet, Take 1 mg by mouth 4 (four) times daily., Disp: , Rfl:  .  clotrimazole-betamethasone (LOTRISONE) cream, Apply 1 application topically 2 (two) times daily., Disp: 45 g, Rfl: 3 .  entacapone (COMTAN) 200 MG tablet, Take 1 tablet (200 mg total) by mouth 4 (four) times daily., Disp: 120 tablet, Rfl: 0 .  escitalopram (LEXAPRO) 10 MG tablet, Take 10 mg by mouth daily., Disp: , Rfl:  .  HYDROcodone-acetaminophen (NORCO/VICODIN) 5-325 MG tablet, Take by mouth. , Disp: , Rfl:  .  melatonin 5 MG TABS, Take 1 tablet (5 mg total) by mouth at bedtime., Disp: ,  Rfl: 0 .  neomycin-polymyxin-dexamethasone (MAXITROL) 0.1 % ophthalmic suspension, Place 1 drop into the left eye daily. , Disp: , Rfl:  .  omeprazole (PRILOSEC) 20 MG capsule, Take 1 capsule (20 mg total) by mouth daily., Disp: 90 capsule, Rfl: 0 .  pantoprazole (PROTONIX) 20 MG tablet, TAKE 1 TABLET BY MOUTH EVERY DAY, Disp: 30 tablet, Rfl: 1 .  potassium chloride (KLOR-CON) 10 MEQ tablet, TAKE 1 TABLET BY MOUTH EVERY DAY, Disp: 30 tablet, Rfl: 1 .  prednisoLONE acetate (PRED FORTE) 1 % ophthalmic suspension, Place 1 drop into the left eye 4 (four) times daily. , Disp: , Rfl:  .  pregabalin (LYRICA) 100 MG capsule, Take 100 mg by mouth 3 (three) times daily., Disp: , Rfl:  .  QUEtiapine (SEROQUEL) 100 MG tablet, Take 100 mg by mouth at bedtime., Disp: , Rfl:  .  QUEtiapine  (SEROQUEL) 50 MG tablet, Take 50 mg by mouth 3 (three) times daily., Disp: , Rfl:  .  traZODone (DESYREL) 50 MG tablet, Take 25-50 mg by mouth at bedtime. , Disp: , Rfl:  .  trifluoperazine (STELAZINE) 1 MG tablet, Take 1 tablet (1 mg total) by mouth 2 (two) times daily., Disp: 30 tablet, Rfl: 1   Allergies  Allergen Reactions  . Percocet [Oxycodone-Acetaminophen]     Hallucination  . Baclofen     Caused numbness in mouth   . Codeine Sulfate Nausea And Vomiting  . Gabapentin Swelling    Mouth swelling   . Terazosin   . Tramadol Hcl     Interferes with Serotonin levels which affects her Parkinsons    ROS Review of Systems  Constitutional: Negative.   HENT: Negative.   Eyes: Negative.   Respiratory: Negative.   Cardiovascular: Negative.   Gastrointestinal: Negative.   Endocrine: Negative.   Genitourinary: Positive for enuresis.  Musculoskeletal: Negative.   Skin: Negative.   Allergic/Immunologic: Negative.   Neurological: Positive for tremors, speech difficulty and weakness.  All other systems reviewed and are negative.     Objective:    Physical Exam Vitals reviewed.  Constitutional:      Appearance: Normal appearance.  HENT:     Mouth/Throat:     Mouth: Mucous membranes are moist.  Eyes:     Pupils: Pupils are equal, round, and reactive to light.  Neck:     Vascular: No carotid bruit.  Cardiovascular:     Rate and Rhythm: Normal rate and regular rhythm.     Pulses: Normal pulses.     Heart sounds: Normal heart sounds.  Pulmonary:     Effort: Pulmonary effort is normal.     Breath sounds: Normal breath sounds.  Abdominal:     General: Bowel sounds are normal.     Palpations: Abdomen is soft. There is no hepatomegaly, splenomegaly or mass.     Tenderness: There is no abdominal tenderness.     Hernia: No hernia is present.  Musculoskeletal:        General: No tenderness.     Cervical back: Neck supple.     Right lower leg: No edema.     Left lower leg: No  edema.  Skin:    Findings: No rash.  Neurological:     Mental Status: She is alert. She is disoriented.     Motor: Weakness present.     Coordination: Coordination abnormal.     Gait: Gait abnormal.  Psychiatric:        Mood and Affect: Affect normal.  Comments: Patient parkinsonian syndrome is deteriorating rapidly.     BP 104/66   Pulse 70  Wt Readings from Last 3 Encounters:  04/10/20 149 lb 14.6 oz (68 kg)  03/28/20 150 lb (68 kg)  02/16/20 145 lb 1 oz (65.8 kg)     Health Maintenance Due  Topic Date Due  . Hepatitis C Screening  Never done  . TETANUS/TDAP  Never done  . COLONOSCOPY  Never done  . DEXA SCAN  Never done  . PNA vac Low Risk Adult (1 of 2 - PCV13) Never done  . MAMMOGRAM  05/10/2020    There are no preventive care reminders to display for this patient.  Lab Results  Component Value Date   TSH 1.778 09/24/2019   Lab Results  Component Value Date   WBC 7.4 04/10/2020   HGB 11.6 (L) 04/10/2020   HCT 36.2 04/10/2020   MCV 87.9 04/10/2020   PLT 174 04/10/2020   Lab Results  Component Value Date   NA 140 04/10/2020   K 3.4 (L) 04/10/2020   CO2 24 04/10/2020   GLUCOSE 102 (H) 04/10/2020   BUN 23 04/10/2020   CREATININE 0.58 04/10/2020   BILITOT 1.4 (H) 04/10/2020   ALKPHOS 89 04/10/2020   AST 13 (L) 04/10/2020   ALT <5 04/10/2020   PROT 6.1 (L) 04/10/2020   ALBUMIN 3.9 04/10/2020   CALCIUM 8.7 (L) 04/10/2020   ANIONGAP 11 04/10/2020   No results found for: CHOL No results found for: HDL No results found for: LDLCALC No results found for: TRIG No results found for: CHOLHDL No results found for: HGBA1C    Assessment & Plan:   Problem List Items Addressed This Visit      Nervous and Auditory   Dementia due to Parkinson's disease with behavioral disturbance ([place]) - Primary     Musculoskeletal and Integument   Lumbar spondylosis (Chronic)    She has a chronic back back pain due to spondylosis      Osteoarthritis of knee     Patient has a chronic osteoarthritis of both knees.        Other   Anxiety disorder    Patient has severe anxiety problem cannot sleep good at night.      Psychosis [facility])    Patient has abnormal thoughts and abnormal behavior secondary to parkinsonian disease.  She is wheelchair-bound.  She is totally dependent on the care provided by her husband.  I will try to see whether we can get her with hospice.  This is a terminal illness and her lifespan is limited.         No orders of the defined types were placed in this encounter.   Follow-up: No follow-ups on file.    Cletis Athens, MD

## 2020-07-24 ENCOUNTER — Encounter: Payer: Self-pay | Admitting: Internal Medicine

## 2020-07-24 NOTE — Assessment & Plan Note (Signed)
Patient has severe anxiety problem cannot sleep good at night.

## 2020-07-24 NOTE — Assessment & Plan Note (Signed)
Patient has abnormal thoughts and abnormal behavior secondary to parkinsonian disease.  She is wheelchair-bound.  She is totally dependent on the care provided by her husband.  I will try to see whether we can get her with hospice.  This is a terminal illness and her lifespan is limited.

## 2020-07-24 NOTE — Assessment & Plan Note (Signed)
She has a chronic back back pain due to spondylosis

## 2020-07-24 NOTE — Assessment & Plan Note (Signed)
Patient has a chronic osteoarthritis of both knees.

## 2020-08-07 ENCOUNTER — Encounter: Payer: Self-pay | Admitting: Medical Oncology

## 2020-08-07 ENCOUNTER — Emergency Department: Payer: Medicare Other

## 2020-08-07 ENCOUNTER — Other Ambulatory Visit: Payer: Self-pay

## 2020-08-07 ENCOUNTER — Emergency Department
Admission: EM | Admit: 2020-08-07 | Discharge: 2020-08-08 | Disposition: A | Discharge status: Home / Self Care | Payer: Medicare Other | Attending: Emergency Medicine | Admitting: Emergency Medicine

## 2020-08-07 DIAGNOSIS — R404 Transient alteration of awareness: Secondary | ICD-10-CM | POA: Diagnosis not present

## 2020-08-07 DIAGNOSIS — Z8572 Personal history of non-Hodgkin lymphomas: Secondary | ICD-10-CM | POA: Insufficient documentation

## 2020-08-07 DIAGNOSIS — Z79899 Other long term (current) drug therapy: Secondary | ICD-10-CM | POA: Insufficient documentation

## 2020-08-07 DIAGNOSIS — I10 Essential (primary) hypertension: Secondary | ICD-10-CM | POA: Insufficient documentation

## 2020-08-07 DIAGNOSIS — R456 Violent behavior: Secondary | ICD-10-CM | POA: Diagnosis not present

## 2020-08-07 DIAGNOSIS — Z96652 Presence of left artificial knee joint: Secondary | ICD-10-CM | POA: Insufficient documentation

## 2020-08-07 DIAGNOSIS — G20 Parkinson's disease: Secondary | ICD-10-CM | POA: Diagnosis not present

## 2020-08-07 DIAGNOSIS — R4182 Altered mental status, unspecified: Secondary | ICD-10-CM | POA: Insufficient documentation

## 2020-08-07 DIAGNOSIS — F0390 Unspecified dementia without behavioral disturbance: Secondary | ICD-10-CM | POA: Insufficient documentation

## 2020-08-07 DIAGNOSIS — F0281 Dementia in other diseases classified elsewhere with behavioral disturbance: Secondary | ICD-10-CM | POA: Diagnosis not present

## 2020-08-07 DIAGNOSIS — Z20822 Contact with and (suspected) exposure to covid-19: Secondary | ICD-10-CM | POA: Diagnosis not present

## 2020-08-07 DIAGNOSIS — Z87891 Personal history of nicotine dependence: Secondary | ICD-10-CM | POA: Diagnosis not present

## 2020-08-07 DIAGNOSIS — F0280 Dementia in other diseases classified elsewhere without behavioral disturbance: Secondary | ICD-10-CM | POA: Diagnosis present

## 2020-08-07 DIAGNOSIS — R0902 Hypoxemia: Secondary | ICD-10-CM | POA: Diagnosis not present

## 2020-08-07 DIAGNOSIS — F29 Unspecified psychosis not due to a substance or known physiological condition: Secondary | ICD-10-CM | POA: Insufficient documentation

## 2020-08-07 LAB — CBC WITH DIFFERENTIAL/PLATELET
Abs Immature Granulocytes: 0.01 10*3/uL (ref 0.00–0.07)
Basophils Absolute: 0 10*3/uL (ref 0.0–0.1)
Basophils Relative: 0 %
Eosinophils Absolute: 0.1 10*3/uL (ref 0.0–0.5)
Eosinophils Relative: 2 %
HCT: 37.3 % (ref 36.0–46.0)
Hemoglobin: 11.9 g/dL — ABNORMAL LOW (ref 12.0–15.0)
Immature Granulocytes: 0 %
Lymphocytes Relative: 27 %
Lymphs Abs: 1.5 10*3/uL (ref 0.7–4.0)
MCH: 27.9 pg (ref 26.0–34.0)
MCHC: 31.9 g/dL (ref 30.0–36.0)
MCV: 87.4 fL (ref 80.0–100.0)
Monocytes Absolute: 0.5 10*3/uL (ref 0.1–1.0)
Monocytes Relative: 8 %
Neutro Abs: 3.6 10*3/uL (ref 1.7–7.7)
Neutrophils Relative %: 63 %
Platelets: 179 10*3/uL (ref 150–400)
RBC: 4.27 MIL/uL (ref 3.87–5.11)
RDW: 15.4 % (ref 11.5–15.5)
WBC: 5.7 10*3/uL (ref 4.0–10.5)
nRBC: 0 % (ref 0.0–0.2)

## 2020-08-07 LAB — RESP PANEL BY RT-PCR (FLU A&B, COVID) ARPGX2
Influenza A by PCR: NEGATIVE
Influenza B by PCR: NEGATIVE
SARS Coronavirus 2 by RT PCR: NEGATIVE

## 2020-08-07 LAB — COMPREHENSIVE METABOLIC PANEL
ALT: <5 U/L (ref 0–44)
AST: 17 U/L (ref 15–41)
Albumin: 4 g/dL (ref 3.5–5.0)
Alkaline Phosphatase: 84 U/L (ref 38–126)
Anion gap: 9 (ref 5–15)
BUN: 26 mg/dL — ABNORMAL HIGH (ref 8–23)
CO2: 24 mmol/L (ref 22–32)
Calcium: 8.7 mg/dL — ABNORMAL LOW (ref 8.9–10.3)
Chloride: 110 mmol/L (ref 98–111)
Creatinine, Ser: 0.57 mg/dL (ref 0.44–1.00)
GFR, Estimated: >60 mL/min (ref >60)
Glucose, Bld: 99 mg/dL (ref 70–99)
Potassium: 3.8 mmol/L (ref 3.5–5.1)
Sodium: 143 mmol/L (ref 135–145)
Total Bilirubin: 0.9 mg/dL (ref 0.3–1.2)
Total Protein: 6.2 g/dL — ABNORMAL LOW (ref 6.5–8.1)

## 2020-08-07 LAB — ACETAMINOPHEN LEVEL: Acetaminophen (Tylenol), Serum: <10 ug/mL — ABNORMAL LOW (ref 10–30)

## 2020-08-07 LAB — URINE DRUG SCREEN, QUALITATIVE (ARMC ONLY)
Barbiturates, Ur Screen: NOT DETECTED
Cocaine Metabolite,Ur ~~LOC~~: NOT DETECTED
MDMA (Ecstasy)Ur Screen: NOT DETECTED
Methadone Scn, Ur: NOT DETECTED
Tricyclic, Ur Screen: POSITIVE — AB

## 2020-08-07 LAB — URINALYSIS, COMPLETE (UACMP) WITH MICROSCOPIC
Bacteria, UA: NONE SEEN
Glucose, UA: NEGATIVE mg/dL
Ketones, ur: 5 mg/dL — AB
Leukocytes,Ua: NEGATIVE
Nitrite: NEGATIVE
Protein, ur: 30 mg/dL — AB
Specific Gravity, Urine: 1.039 — ABNORMAL HIGH (ref 1.005–1.030)
Squamous Epithelial / HPF: NONE SEEN (ref 0–5)
pH: 5 (ref 5.0–8.0)

## 2020-08-07 LAB — ETHANOL: Alcohol, Ethyl (B): <10 mg/dL (ref <10)

## 2020-08-07 LAB — SALICYLATE LEVEL: Salicylate Lvl: <7 mg/dL — ABNORMAL LOW (ref 7.0–30.0)

## 2020-08-07 MED ORDER — TRIFLUOPERAZINE HCL 2 MG PO TABS
1.0000 mg | ORAL_TABLET | Freq: Two times a day (BID) | ORAL | Status: DC
  Administered 2020-08-08: 1 mg via ORAL
  Filled 2020-08-07 (×3): qty 1

## 2020-08-07 MED ORDER — NEOMYCIN-POLYMYXIN-DEXAMETH 3.5-10000-0.1 OP SUSP
1.0000 [drp] | Freq: Every day | OPHTHALMIC | Status: DC
  Administered 2020-08-08: 1 [drp] via OPHTHALMIC
  Filled 2020-08-07 (×2): qty 5

## 2020-08-07 MED ORDER — HYDROCODONE-ACETAMINOPHEN 5-325 MG PO TABS: 1.0000 | ORAL_TABLET | Freq: Four times a day (QID) | ORAL | Status: DC | PRN

## 2020-08-07 MED ORDER — CARBIDOPA-LEVODOPA ER 25-100 MG PO TBCR: 2.0000 | EXTENDED_RELEASE_TABLET | ORAL | Status: DC

## 2020-08-07 MED ORDER — CLOTRIMAZOLE-BETAMETHASONE 1-0.05 % EX CREA
1.0000 "application " | TOPICAL_CREAM | Freq: Two times a day (BID) | CUTANEOUS | Status: DC
  Administered 2020-08-08: 1 via TOPICAL
  Filled 2020-08-07: qty 15

## 2020-08-07 MED ORDER — ACETAMINOPHEN 500 MG PO TABS
500.0000 mg | ORAL_TABLET | Freq: Two times a day (BID) | ORAL | Status: DC
  Administered 2020-08-07 – 2020-08-08 (×2): 500 mg via ORAL
  Filled 2020-08-07 (×2): qty 1

## 2020-08-07 MED ORDER — POTASSIUM CHLORIDE ER 10 MEQ PO TBCR
10.0000 meq | EXTENDED_RELEASE_TABLET | Freq: Every day | ORAL | Status: DC
  Administered 2020-08-08: 10 meq via ORAL
  Filled 2020-08-07: qty 1

## 2020-08-07 MED ORDER — QUETIAPINE FUMARATE 25 MG PO TABS
50.0000 mg | ORAL_TABLET | Freq: Three times a day (TID) | ORAL | Status: DC
  Administered 2020-08-07 – 2020-08-08 (×2): 50 mg via ORAL
  Filled 2020-08-07 (×2): qty 2

## 2020-08-07 MED ORDER — PANTOPRAZOLE SODIUM 20 MG PO TBEC
20.0000 mg | DELAYED_RELEASE_TABLET | Freq: Every day | ORAL | Status: DC
  Filled 2020-08-07: qty 1

## 2020-08-07 MED ORDER — CARBIDOPA-LEVODOPA ER 25-100 MG PO TBCR
1.0000 | EXTENDED_RELEASE_TABLET | Freq: Every evening | ORAL | Status: DC | PRN
  Filled 2020-08-07: qty 2

## 2020-08-07 MED ORDER — QUETIAPINE FUMARATE 25 MG PO TABS
100.0000 mg | ORAL_TABLET | Freq: Every day | ORAL | Status: DC
  Administered 2020-08-07: 100 mg via ORAL
  Filled 2020-08-07: qty 4

## 2020-08-07 MED ORDER — ESCITALOPRAM OXALATE 10 MG PO TABS
10.0000 mg | ORAL_TABLET | Freq: Every day | ORAL | Status: DC
  Administered 2020-08-08: 10 mg via ORAL
  Filled 2020-08-07: qty 1

## 2020-08-07 MED ORDER — TRAZODONE HCL 50 MG PO TABS
25.0000 mg | ORAL_TABLET | Freq: Every day | ORAL | Status: DC
  Administered 2020-08-07: 50 mg via ORAL
  Filled 2020-08-07: qty 1

## 2020-08-07 MED ORDER — PREDNISOLONE ACETATE 1 % OP SUSP
1.0000 [drp] | Freq: Four times a day (QID) | OPHTHALMIC | Status: DC
  Administered 2020-08-07 – 2020-08-08 (×2): 1 [drp] via OPHTHALMIC
  Filled 2020-08-07: qty 5

## 2020-08-07 MED ORDER — CLONAZEPAM 0.5 MG PO TABS
1.0000 mg | ORAL_TABLET | Freq: Four times a day (QID) | ORAL | Status: DC
  Administered 2020-08-07 – 2020-08-08 (×3): 1 mg via ORAL
  Filled 2020-08-07 (×3): qty 2

## 2020-08-07 MED ORDER — MELATONIN 5 MG PO TABS
5.0000 mg | ORAL_TABLET | Freq: Every day | ORAL | Status: DC
  Administered 2020-08-07: 5 mg via ORAL
  Filled 2020-08-07: qty 1

## 2020-08-07 MED ORDER — CARBIDOPA-LEVODOPA ER 25-100 MG PO TBCR
2.0000 | EXTENDED_RELEASE_TABLET | ORAL | Status: DC
  Administered 2020-08-07 – 2020-08-08 (×3): 2 via ORAL
  Filled 2020-08-07 (×12): qty 2

## 2020-08-07 MED ORDER — ALPRAZOLAM 0.5 MG PO TABS: 0.2500 mg | ORAL_TABLET | Freq: Two times a day (BID) | ORAL | Status: DC | PRN

## 2020-08-07 MED ORDER — ENTACAPONE 200 MG PO TABS
200.0000 mg | ORAL_TABLET | Freq: Four times a day (QID) | ORAL | Status: DC
  Administered 2020-08-07 – 2020-08-08 (×3): 200 mg via ORAL
  Filled 2020-08-07 (×5): qty 1

## 2020-08-07 MED ORDER — PREGABALIN 50 MG PO CAPS
100.0000 mg | ORAL_CAPSULE | Freq: Three times a day (TID) | ORAL | Status: DC
  Administered 2020-08-07 – 2020-08-08 (×2): 100 mg via ORAL
  Filled 2020-08-07 (×2): qty 2

## 2020-08-07 NOTE — ED Notes (Signed)
Report to include Situation, Background, Assessment, and Recommendations received from [facility]. Patient alert and oriented, warm and dry, in no acute distress. Patient denies SI, HI, AVH and pain. Patient made aware of Q15 minute rounds and Psychologist, counselling presence for their safety. Patient instructed to come to me with needs or concerns.

## 2020-08-07 NOTE — ED Provider Notes (Signed)
[facility] Emergency Department Provider Note   ____________________________________________   Event Date/Time   First MD Initiated Contact with Patient 08/07/20 1755     (approximate)  I have reviewed the triage vital signs and the nursing notes.   HISTORY  Chief Complaint Altered Mental Status   HPI Sarah Donaldson is a 69 y.o. female patient sent from home for being very combative and uncooperative.  Patient has dementia and Parkinson's.  She is yelling and screaming and not making much sense         Past Medical History:  Diagnosis Date  . Cancer ([place])    Non-hodgkin's lymphoma (in remission)  . GERD (gastroesophageal reflux disease)    well-controlled w/ omeprazole  . Hyperlipidemia   . Knee joint cyst, left 02/07/2018  . Parkinson disease ([place])   . Parkinson disease [facility])     Patient Active Problem List   Diagnosis Date Noted  . Essential hypertension 06/02/2020  . Need for influenza vaccination 06/02/2020  . Encounter for annual health check of caregiver 04/08/2020  . Altered mental status   . Dementia due to Parkinson's disease with behavioral disturbance ([place])   . SDH (subdural hematoma) ([place]) 12/11/2019  . Falls frequently 12/11/2019  . S/P total knee replacement, left 06/07/2019  . Osteoarthritis of knee 04/27/2019  . Panic attacks 04/07/2019  . Psychosis ([place]) 04/04/2019  . Numbness 03/09/2018  . Lumbar spondylosis 03/07/2018  . Chronic pain of left knee 03/07/2018  . Chronic upper back pain 12/08/2017  . Benign neoplasm of hand 10/20/2017  . Chronic pain syndrome 10/20/2017  . Chronic pain of right lower extremity 10/20/2017  . Chronic myofascial pain 10/20/2017  . Numbness and tingling 07/12/2017  . Sleep difficulties 07/12/2017  . Low back pain 01/18/2017  . Sleep behavior disorder, REM 01/18/2017  . Lumbar radiculopathy 11/01/2016  . Follicular lymphoma of extranodal and solid organ sites [facility]) 05/01/2016  .  Lymphoma ([place]) 04/30/2016  . Hallux limitus 07/27/2015  . Low back pain with sciatica 02/13/2015  . Bilateral low back pain with sciatica 02/13/2015  . H/O adenomatous polyp of colon 04/06/2014  . Hx of adenomatous colonic polyps 04/06/2014  . Back ache 01/25/2014  . Adiposity 01/25/2014  . REM sleep behavior disorder 01/25/2014  . Disordered sleep 01/25/2014  . Has a tremor 01/25/2014  . Obesity, unspecified 01/25/2014  . Sleep disorder 01/25/2014  . Backache 01/25/2014  . Tremor 01/25/2014  . Obesity 01/25/2014  . Difficulty hearing 06/08/2012  . Hearing loss 06/08/2012  . Anxiety disorder 06/06/2012  . Colon polyp 06/06/2012  . Clinical depression 06/06/2012  . Hypercholesteremia 06/06/2012  . Idiopathic Parkinson's disease ([place]) 06/06/2012  . Detached retina 06/06/2012  . Depressive disorder 06/06/2012  . Retinal detachment 06/06/2012  . Parkinson's disease ([place]) 06/06/2012    Past Surgical History:  Procedure Laterality Date  . BACK SURGERY    . BUNIONECTOMY    . NASAL SEPTUM SURGERY    . RETINAL DETACHMENT SURGERY    . TOTAL KNEE ARTHROPLASTY Left 06/07/2019   Procedure: TOTAL KNEE ARTHROPLASTY;  Surgeon: Lovell Sheehan, MD;  Location: [facility] ORS;  Service: Orthopedics;  Laterality: Left;    Prior to Admission medications   Medication Sig Start Date End Date Taking? Authorizing Provider  acetaminophen (TYLENOL) 500 MG tablet Take 500 mg by mouth 2 (two) times daily.     [provider]  ALPRAZolam Duanne Moron) 0.25 MG tablet Take 0.25 mg by mouth 2 (two) times daily  as needed. 04/01/20   [provider]  Carbidopa-Levodopa ER (SINEMET CR) 25-100 MG tablet controlled release TAKE 2 EVERY 2 HOURS TAKE AN EXTRA 1-2 TABLETS AT NIGHT FOR CHEST TIGHTNESS    [provider]  clonazePAM (KLONOPIN) 2 MG tablet Take 1 mg by mouth 4 (four) times daily. 04/03/20   [provider]  clotrimazole-betamethasone (LOTRISONE) cream Apply 1 application  topically 2 (two) times daily. 07/16/20   Felecia Shelling, DPM  entacapone (COMTAN) 200 MG tablet Take 1 tablet (200 mg total) by mouth 4 (four) times daily. 03/30/20   Charm Rings, NP  escitalopram (LEXAPRO) 10 MG tablet Take 10 mg by mouth daily.    [provider]  HYDROcodone-acetaminophen (NORCO/VICODIN) 5-325 MG tablet Take by mouth.  04/02/20   [provider]  melatonin 5 MG TABS Take 1 tablet (5 mg total) by mouth at bedtime. 12/13/19   Pennie Banter, DO  neomycin-polymyxin-dexamethasone (MAXITROL) 0.1 % ophthalmic suspension Place 1 drop into the left eye daily.     [provider]  pantoprazole (PROTONIX) 20 MG tablet TAKE 1 TABLET BY MOUTH EVERY DAY 05/28/20   Charm Rings, NP  potassium chloride (KLOR-CON) 10 MEQ tablet TAKE 1 TABLET BY MOUTH EVERY DAY 05/06/20   Corky Downs, MD  prednisoLONE acetate (PRED FORTE) 1 % ophthalmic suspension Place 1 drop into the left eye 4 (four) times daily.     [provider]  pregabalin (LYRICA) 100 MG capsule Take 100 mg by mouth 3 (three) times daily. 11/18/19   [provider]  QUEtiapine (SEROQUEL) 100 MG tablet Take 100 mg by mouth at bedtime.    [provider]  QUEtiapine (SEROQUEL) 50 MG tablet Take 50 mg by mouth 3 (three) times daily.    [provider]  traZODone (DESYREL) 50 MG tablet Take 25-50 mg by mouth at bedtime.  10/04/18   [provider]  trifluoperazine (STELAZINE) 1 MG tablet Take 1 tablet (1 mg total) by mouth 2 (two) times daily. 03/30/20   Charm Rings, NP    Allergies Percocet [oxycodone-acetaminophen], Baclofen, Codeine sulfate, Gabapentin, Terazosin, and Tramadol hcl  Family History  Problem Relation Age of Onset  . Heart disease Mother   . Alcohol abuse Father   . Breast cancer Neg Hx   . Mental illness Neg Hx     Social History Social History   Tobacco Use  . Smoking status: Former Games developer  . Smokeless tobacco: Never Used  Vaping  Use  . Vaping Use: Never used  Substance Use Topics  . Alcohol use: Yes    Alcohol/week: 0.0 standard drinks  . Drug use: No    Review of Systems Unable to obtain ____________________________________________   PHYSICAL EXAM:  VITAL SIGNS: ED Triage Vitals  Enc Vitals Group     BP 08/07/20 1749 123/60     Pulse Rate 08/07/20 1749 79     Resp 08/07/20 1749 20     Temp 08/07/20 1749 98.3 F (36.8 C)     Temp Source 08/07/20 1749 Oral     SpO2 08/07/20 1749 95 %     Weight 08/07/20 1751 150 lb (68 kg)     Height 08/07/20 1751 5\' 4"  (1.626 m)     Head Circumference --      Peak Flow --      Pain Score --      Pain Loc --      Pain Edu? --  Excl. in [place]? --    Constitutional: Patient is alert she is writhing on the bed when you ask her question she will begin answering it in the first few words will make sense and then she goes often to some tangent that does not make any sense and is very hard to understand speech is very slurry Eyes: Conjunctivae are normal. PER. EOMI. Head: Atraumatic. Nose: No congestion/rhinnorhea. Mouth/Throat: Mucous membranes are moist.  Oropharynx non-erythematous. Neck: No stridor.   Cardiovascular: Normal rate, regular rhythm. Grossly normal heart sounds.  Good peripheral circulation. Respiratory: Normal respiratory effort.  No retractions. Lungs CTAB. Gastrointestinal: Soft and nontender. No distention. Musculoskeletal: No lower extremity tenderness nor edema.   Neurologic: Patient is alert and awake I am not sure how oriented she is.  I cannot really get her to answer questions.  She is moving all extremities equally and well will not really follow commands at all.  I cannot find any evidence of focal neurological deficits except for the slurry speech that she has had for 6 weeks.   ____________________________________________   LABS (all labs ordered are listed, but only abnormal results are displayed)  Labs Reviewed  COMPREHENSIVE  METABOLIC PANEL - Abnormal; Notable for the following components:      Result Value   BUN 26 (*)    Calcium 8.7 (*)    Total Protein 6.2 (*)    All other components within normal limits  SALICYLATE LEVEL - Abnormal; Notable for the following components:   Salicylate Lvl [id number] (*)    All other components within normal limits  ACETAMINOPHEN LEVEL - Abnormal; Notable for the following components:   Acetaminophen (Tylenol), Serum <10 (*)    All other components within normal limits  URINE DRUG SCREEN, QUALITATIVE ([facility] ONLY) - Abnormal; Notable for the following components:   Tricyclic, Ur Screen POSITIVE (*)    Amphetamines, Ur Screen RESULTS UNAVAILABLE DUE TO INTERFERING SUBSTANCE (*)    Opiate, Ur Screen RESULTS UNAVAILABLE DUE TO INTERFERING SUBSTANCE (*)    Phencyclidine (PCP) Ur S RESULTS UNAVAILABLE DUE TO INTERFERING SUBSTANCE (*)    Cannabinoid 50 Ng, Ur [place] RESULTS UNAVAILABLE DUE TO INTERFERING SUBSTANCE (*)    Benzodiazepine, Ur Scrn RESULTS UNAVAILABLE DUE TO INTERFERING SUBSTANCE (*)    All other components within normal limits  URINALYSIS, COMPLETE (UACMP) WITH MICROSCOPIC - Abnormal; Notable for the following components:   Color, Urine AMBER (*)    APPearance CLOUDY (*)    Specific Gravity, Urine 1.039 (*)    Hgb urine dipstick SMALL (*)    Bilirubin Urine SMALL (*)    Ketones, ur 5 (*)    Protein, ur 30 (*)    All other components within normal limits  CBC WITH DIFFERENTIAL/PLATELET - Abnormal; Notable for the following components:   Hemoglobin 11.9 (*)    All other components within normal limits  RESP PANEL BY RT-PCR (FLU A&B, COVID) ARPGX2  ETHANOL  PROCALCITONIN   ____________________________________________  EKG EKG read interpreted by me shows normal sinus rhythm rate of 78 normal axis very irregular baseline but no obvious acute ST-T wave changes  ____________________________________________  RADIOLOGY Gertha Calkin, personally viewed and evaluated  these images (plain radiographs) as part of my medical decision making, as well as reviewing the written report by the radiologist.  ED MD interpretation: X-ray read by radiology reviewed by me does indeed show bibasilar patchy infiltrates which could be pneumonia or Covid.  Could also be atelectasis but doubt that.  Official radiology report(s): CT Head Wo Contrast  Result Date: 08/07/2020 CLINICAL DATA:  Slurred speech EXAM: CT HEAD WITHOUT CONTRAST TECHNIQUE: Contiguous axial images were obtained from the base of the skull through the vertex without intravenous contrast. COMPARISON:  CT brain 03/28/2020 FINDINGS: Brain: No acute territorial infarction, hemorrhage, or intracranial mass. The ventricles are nonenlarged. Vascular: No hyperdense vessels.  No unexpected calcification. Skull: Normal. Negative for fracture or focal lesion. Sinuses/Orbits: No acute finding. Other: None IMPRESSION: Negative non contrasted CT appearance of the brain. Electronically Signed   By: Donavan Foil M.D.   On: 08/07/2020 20:51   DG Chest Portable 1 View  Result Date: 08/07/2020 CLINICAL DATA:  Altered mental status. EXAM: PORTABLE CHEST 1 VIEW COMPARISON:  April 11, 2020. FINDINGS: The heart size and mediastinal contours are within normal limits. No pneumothorax or pleural effusion is noted. Patchy bibasilar opacities are noted which may represent atelectasis or possibly infiltrates. The visualized skeletal structures are unremarkable. IMPRESSION: Patchy bibasilar opacities are noted which may represent atelectasis or possibly infiltrates. Electronically Signed   By: Marijo Conception M.D.   On: 08/07/2020 18:24    ____________________________________________   PROCEDURES  Procedure(s) performed (including Critical Care):  Procedures   ____________________________________________   INITIAL IMPRESSION / ASSESSMENT AND PLAN / ED COURSE  I am signing this patient out to the oncoming provider      Patient seems to be improving in her mental status.  Possibly she is becoming less agitated.         ____________________________________________   FINAL CLINICAL IMPRESSION(S) / ED DIAGNOSES  Final diagnoses:  Altered mental status, unspecified altered mental status type     ED Discharge Orders    None      *Please note:  Sarah Donaldson was evaluated in Emergency Department on 08/07/2020 for the symptoms described in the history of present illness. She was evaluated in the context of the global COVID-19 pandemic, which necessitated consideration that the patient might be at risk for infection with the SARS-CoV-2 virus that causes COVID-19. Institutional protocols and algorithms that pertain to the evaluation of patients at risk for COVID-19 are in a state of rapid change based on information released by regulatory bodies including the CDC and federal and state organizations. These policies and algorithms were followed during the patient's care in the ED.  Some ED evaluations and interventions may be delayed as a result of limited staffing during and the pandemic.*   Note:  This document was prepared using Dragon voice recognition software and may include unintentional dictation errors.    Nena Polio, MD 08/07/20 2207

## 2020-08-07 NOTE — ED Triage Notes (Signed)
Pt to ED via [facility] with c/o AMS, per EMS pt with hx of dementia, upon arrival is noted to be combative, and uncooperative. Pt noted to be attempting to sit up on EMS stretcher and is yelling non-sense words and incoherent words upon arrival to ED.

## 2020-08-07 NOTE — ED Triage Notes (Signed)
Pt here via [facility] from home. Per EMS family called 911 d/t patient being altered today. Pt screaming on arrival.

## 2020-08-07 NOTE — ED Notes (Signed)
Pt's husband Willer Osorno contacted about patient [phone]

## 2020-08-07 NOTE — BH Assessment (Addendum)
Comprehensive Clinical Assessment (CCA) Note  08/07/2020 Sarah Donaldson [id number]  Chief Complaint: Patient is a 69 year old female presenting to [facility] ED voluntarily. Per triage note Pt to ED via [facility] with c/o AMS, per EMS pt with hx of dementia, upon arrival is noted to be combative, and uncooperative. Pt noted to be attempting to sit up on EMS stretcher and is yelling non-sense words and incoherent words upon arrival to ED. During assessment patient appears alert and oriented x4, calm and cooperative. When asked why patient was presenting to [facility] ED she reports 'I got into a argument with my husband, I'm fine, I was cleaning out the closet and I have Parkinson's my husband didn't like that so he told me to sit in the chair." Patient denies SI/HI/AH/VH and does not appear to be responding to any internal or external stimuli.  Collateral information was obtained from patient's husband Rudell Naumann [phone]) who reports "she has been on edge for a 1 1/2 weeks, her neurologist wanted me to bring her to the hospital, she has been making a mess, decorating things, making noises." "Today a hospice nurse came by for a interview, my wife didn't like that because the nurse was saying that I was incapable of taking care of her, so she got triggered." "She's been sleeping all day and up all night, I just want to get her calmed down." "The neurologist decreased her medication, Quetiapine from 25mg  and 50mg  night, she was taking more than that before and it helped her stay calm." "I tried to bring her back to her Neurologist but he is on vacation, so I've been trying to work with her at home." Patient's husband is unsure what he would like for patient, he reports he either wants her medication adjusted or Inpatient  Per Psyc NP Lindon Romp patient does not meet criteria for Inpatient Hospitalization, suggestion was made for SW consult for potential nursing home or alternative home health options Chief  Complaint  Patient presents with  . Altered Mental Status   Visit Diagnosis: Altered Mental Status    CCA Screening, Triage and Referral (STR)  Patient Reported Information How did you hear about [place]? Family/Friend  Referral name: No data recorded Referral phone number: No data recorded  Whom do you see for routine medical problems? Primary Care  Practice/Facility Name: [facility]  Practice/Facility Phone Number: No data recorded Name of Contact: No data recorded Contact Number: No data recorded Contact Fax Number: No data recorded Prescriber Name: No data recorded Prescriber Address (if known): No data recorded  What Is the Reason for Your Visit/Call Today? No data recorded How Long Has This Been Causing You Problems? > than 6 months  What Do You Feel Would Help You the Most Today? Medication; Other (Comment)   Have You Recently Been in Any Inpatient Treatment (Hospital/Detox/Crisis Center/28-Day Program)? Yes  Name/Location of [facility]  How Long Were You There? No data recorded When Were You Discharged? No data recorded  Have You Ever Received Services From [facility] Before? Yes  Who Do You See at [facility]? Internal medicine   Have You Recently Had Any Thoughts About Hurting Yourself? No  Are You Planning to Commit Suicide/Harm Yourself At This time? No   Have you Recently Had Thoughts About [place]? No  Explanation: No data recorded  Have You Used Any Alcohol or Drugs in the Past 24 Hours? No  How Long Ago Did You Use Drugs  or Alcohol? No data recorded What Did You Use and How Much? No data recorded  Do You Currently Have a Therapist/Psychiatrist? No  Name of Therapist/Psychiatrist: No data recorded  Have You Been Recently Discharged From Any Office Practice or Programs? No  Explanation of Discharge From Practice/Program: No data recorded    CCA Screening Triage Referral  Assessment Type of Contact: Face-to-Face  Is this Initial or Reassessment? No data recorded Date Telepsych consult ordered in [facility]:  03/28/2020  Time Telepsych consult ordered in [facility]:  1611   Patient Reported Information Reviewed? No data recorded Patient Left Without Being Seen? No data recorded Reason for Not Completing Assessment: Patient's speech is incoherent as she had no voice.    Collateral Involvement: Meghin Fontenot [id number]   Does Patient Have a Court Appointed Legal Guardian? No data recorded Name and Contact of Legal Guardian: Self  If Minor and Not Living with Parent(s), Who has Custody? No data recorded Is CPS involved or ever been involved? Never  Is APS involved or ever been involved? Never   Patient Determined To Be At Risk for Harm To Self or Others Based on Review of Patient Reported Information or Presenting Complaint? No  Method: No data recorded Availability of Means: No data recorded Intent: No data recorded Notification Required: No data recorded Additional Information for Danger to Others Potential: No data recorded Additional Comments for Danger to Others Potential: No data recorded Are There Guns or Other Weapons in Your Home? No data recorded Types of Guns/Weapons: No data recorded Are These Weapons Safely Secured?                            No data recorded Who Could Verify You Are Able To Have These Secured: No data recorded Do You Have any Outstanding Charges, Pending Court Dates, Parole/Probation? No data recorded Contacted To Inform of Risk of Harm To Self or Others: No data recorded  Location of Assessment: [facility] ED   Does Patient Present under Involuntary Commitment? No  IVC Papers Initial File Date: No data recorded  [place] of Residence: [place]   Patient Currently Receiving the Following Services: Medication Management   Determination of Need: Emergent (2 hours)   Options For Referral: No data recorded    CCA  Biopsychosocial Intake/Chief Complaint:  Altered mental status  Current Symptoms/Problems: Agitation   Patient Reported Schizophrenia/Schizoaffective Diagnosis in Past: No   Strengths: UTA  Preferences: UTA  Abilities: UTA   Type of Services Patient Feels are Needed: Unknown   Initial Clinical Notes/Concerns: None   Mental Health Symptoms Depression:  None   Duration of Depressive symptoms: No data recorded  Mania:  None   Anxiety:   Irritability   Psychosis:  None   Duration of Psychotic symptoms: No data recorded  Trauma:  None   Obsessions:  None   Compulsions:  None   Inattention:  Forgetful; Loses things; Poor follow-through on tasks   Hyperactivity/Impulsivity:  N/A   Oppositional/Defiant Behaviors:  None   Emotional Irregularity:  Intense/inappropriate anger   Other Mood/Personality Symptoms:  No data recorded   Mental Status Exam Appearance and self-care  Stature:  Average   Weight:  Average weight   Clothing:  Neat/clean   Grooming:  Normal   Cosmetic use:  None   Posture/gait:  Normal   Motor activity:  Agitated   Sensorium  Attention:  Normal   Concentration:  Normal   Orientation:  X5  Recall/memory:  Defective in Short-term   Affect and Mood  Affect:  Congruent   Mood:  Other (Comment) (Pleasant)   Relating  Eye contact:  Normal   Facial expression:  Responsive   Attitude toward examiner:  Cooperative   Thought and Language  Speech flow: Normal   Thought content:  Appropriate to Mood and Circumstances   Preoccupation:  None   Hallucinations:  None   Organization:  No data recorded  [employer] of Knowledge:  Average   Intelligence:  Average   Abstraction:  Concrete   Judgement:  Fair   Art therapist:  Realistic   Insight:  Fair   Decision Making:  Impulsive   Social Functioning  Social Maturity:  Responsible   Social Judgement:  Normal   Stress  Stressors:  Illness    Coping Ability:  Programme researcher, broadcasting/film/video Deficits:  None   Supports:  Family     Religion: Religion/Spirituality Are You A Religious Person?: No  Leisure/Recreation: Leisure / Recreation Do You Have Hobbies?: No  Exercise/Diet: Exercise/Diet Do You Exercise?: No Have You Gained or Lost A Significant Amount of Weight in the Past Six Months?: No Do You Follow a Special Diet?: No Do You Have Any Trouble Sleeping?: No   CCA Employment/Education Employment/Work Situation: Employment / Work Situation Employment situation: On disability Why is patient on disability: Parkinson's Disease Has patient ever been in the [employer]?: No  Education: Education Is Patient Currently Attending School?: No Did Teacher, adult education From [employer]?: Yes Did Physicist, medical?: No Did Heritage manager?: No Did You Have An Individualized Education Program (IIEP): No Did You Have Any Difficulty At [employer]?: No Patient's Education Has Been Impacted by Current Illness: No   CCA Family/Childhood History Family and Relationship History: Family history Marital status: Married What types of issues is patient dealing with in the relationship?: None Additional relationship information: None  Childhood History:  Childhood History Does patient have siblings?: No Did patient suffer any verbal/emotional/physical/sexual abuse as a child?: No Did patient suffer from severe childhood neglect?: No Has patient ever been sexually abused/assaulted/raped as an adolescent or adult?: No Was the patient ever a victim of a crime or a disaster?: No Witnessed domestic violence?: No Has patient been affected by domestic violence as an adult?: No  Child/Adolescent Assessment:     CCA Substance Use Alcohol/Drug Use: Alcohol / Drug Use Pain Medications: See MAR Prescriptions: See MAR Over the Counter: See MAR History of alcohol / drug use?: No history of alcohol / drug abuse                          ASAM's:  Six Dimensions of Multidimensional Assessment  Dimension 1:  Acute Intoxication and/or Withdrawal Potential:      Dimension 2:  Biomedical Conditions and Complications:      Dimension 3:  Emotional, Behavioral, or Cognitive Conditions and Complications:     Dimension 4:  Readiness to Change:     Dimension 5:  Relapse, Continued use, or Continued Problem Potential:     Dimension 6:  Recovery/Living Environment:     ASAM Severity Score:    ASAM Recommended Level of Treatment:     Substance use Disorder (SUD)    Recommendations for Services/Supports/Treatments:   Per Psyc NP Lindon Romp patient does not meet criteria for Inpatient Hospitalization, suggestion was made for SW consult for potential nursing home or alternative home health options  DSM5 Diagnoses: Patient Active Problem List   Diagnosis Date Noted  . Essential hypertension 06/02/2020  . Need for influenza vaccination 06/02/2020  . Encounter for annual health check of caregiver 04/08/2020  . Altered mental status   . Dementia due to Parkinson's disease with behavioral disturbance (HCC)   . SDH (subdural hematoma) (HCC) 12/11/2019  . Falls frequently 12/11/2019  . S/P total knee replacement, left 06/07/2019  . Osteoarthritis of knee 04/27/2019  . Panic attacks 04/07/2019  . Psychosis (HCC) 04/04/2019  . Numbness 03/09/2018  . Lumbar spondylosis 03/07/2018  . Chronic pain of left knee 03/07/2018  . Chronic upper back pain 12/08/2017  . Benign neoplasm of hand 10/20/2017  . Chronic pain syndrome 10/20/2017  . Chronic pain of right lower extremity 10/20/2017  . Chronic myofascial pain 10/20/2017  . Numbness and tingling 07/12/2017  . Sleep difficulties 07/12/2017  . Low back pain 01/18/2017  . Sleep behavior disorder, REM 01/18/2017  . Lumbar radiculopathy 11/01/2016  . Follicular lymphoma of extranodal and solid organ sites [facility]) 05/01/2016  . Lymphoma (HCC) 04/30/2016  . Hallux limitus  07/27/2015  . Low back pain with sciatica 02/13/2015  . Bilateral low back pain with sciatica 02/13/2015  . H/O adenomatous polyp of colon 04/06/2014  . Hx of adenomatous colonic polyps 04/06/2014  . Back ache 01/25/2014  . Adiposity 01/25/2014  . REM sleep behavior disorder 01/25/2014  . Disordered sleep 01/25/2014  . Has a tremor 01/25/2014  . Obesity, unspecified 01/25/2014  . Sleep disorder 01/25/2014  . Backache 01/25/2014  . Tremor 01/25/2014  . Obesity 01/25/2014  . Difficulty hearing 06/08/2012  . Hearing loss 06/08/2012  . Anxiety disorder 06/06/2012  . Colon polyp 06/06/2012  . Clinical depression 06/06/2012  . Hypercholesteremia 06/06/2012  . Idiopathic Parkinson's disease (HCC) 06/06/2012  . Detached retina 06/06/2012  . Depressive disorder 06/06/2012  . Retinal detachment 06/06/2012  . Parkinson's disease (HCC) 06/06/2012    Patient Centered Plan: Patient is on the following Treatment Plan(s):  Impulse Control, Altered Mental Status   Referrals to Alternative Service(s): Referred to Alternative Service(s):   Place:   Date:   Time:    Referred to Alternative Service(s):   Place:   Date:   Time:    Referred to Alternative Service(s):   Place:   Date:   Time:    Referred to Alternative Service(s):   Place:   Date:   Time:     Shonteria Abeln A Dequante Tremaine, LCAS-A

## 2020-08-08 DIAGNOSIS — F0281 Dementia in other diseases classified elsewhere with behavioral disturbance: Secondary | ICD-10-CM | POA: Diagnosis not present

## 2020-08-08 DIAGNOSIS — G20 Parkinson's disease: Secondary | ICD-10-CM

## 2020-08-08 DIAGNOSIS — R4182 Altered mental status, unspecified: Secondary | ICD-10-CM | POA: Diagnosis not present

## 2020-08-08 LAB — PROCALCITONIN: Procalcitonin: <0.1 ng/mL

## 2020-08-08 MED ORDER — QUETIAPINE FUMARATE 50 MG PO TABS: 50.0000 mg | ORAL_TABLET | Freq: Three times a day (TID) | ORAL | 1 refills | Status: AC

## 2020-08-08 MED ORDER — QUETIAPINE FUMARATE 100 MG PO TABS: 100.0000 mg | ORAL_TABLET | Freq: Every day | ORAL | 1 refills | Status: AC

## 2020-08-08 NOTE — ED Notes (Signed)
Hourly rounding reveals patient in room. No complaints, stable, in no acute distress. Q15 minute rounds and monitoring via Rover and Officer to continue.   

## 2020-08-08 NOTE — ED Provider Notes (Signed)
-----------------------------------------   12:33 PM on 08/08/2020 -----------------------------------------  Patient has been seen and evaluated by psychiatry.  They believe the patient safe for discharge home from psychiatric standpoint.  Has spoken to family who is agreeable to take the patient home as well.  Patient will be discharged once family arrives.  No significant findings on medical work-up.   Minna Antis, MD 08/08/20 1234

## 2020-08-08 NOTE — Consult Note (Signed)
[facility] Face-to-Face Psychiatry Consult   Reason for Consult: Consult for 69 year old woman with a history of Parkinson's disease with dementia and behavioral disturbance Referring Physician: Paduchowski Patient Identification: Sarah Donaldson MRN:  [id number] Principal Diagnosis: Dementia due to Parkinson's disease with behavioral disturbance ([place]) Diagnosis:  Principal Problem:   Dementia due to Parkinson's disease with behavioral disturbance ([place])   Total Time spent with patient: 45 minutes  Subjective:   Sarah Donaldson is a 69 y.o. female patient admitted with patient not able to give information.  HPI: Patient seen chart reviewed.  Went through some of the old notes from both primary care doctor and neurologist.  Spoke to husband on the telephone.  49 year old woman with multiple medical problems who has Parkinson's disease with dementia and behavior problems and mood instability that have been getting worse of late.  Recently had a hospitalization about a month ago.  Initially was doing better but has decompensated in the last week.  The husband believes that her behavior changes because her Seroquel dose was decreased.  He reports that she was taking 100 mg at night and 50 mg 3 times a day but that the patient had complained of drowsiness to the neurologist who then cut the doses in half.  Since that time patient has been more agitated.  Evidently yesterday there was a visit from someone from palliative care at the home and the patient became extremely agitated screaming and husband decided she needed to come to the ER.  This morning the patient is drowsy and sleepy.  Difficult to wake up.  Disoriented when she does.  Currently not aggressive.  Vitals stable.  Past Psychiatric History: Patient has a history of anxiety disorder and now dementia related to Parkinson's disease which has been treated with both benzodiazepines chronically and now with Seroquel.  Risk to Self:   Risk to Others:    Prior Inpatient Therapy:   Prior Outpatient Therapy:    Past Medical History:  Past Medical History:  Diagnosis Date  . Cancer ([place])    Non-hodgkin's lymphoma (in remission)  . GERD (gastroesophageal reflux disease)    well-controlled w/ omeprazole  . Hyperlipidemia   . Knee joint cyst, left 02/07/2018  . Parkinson disease ([place])   . Parkinson disease [facility])     Past Surgical History:  Procedure Laterality Date  . BACK SURGERY    . BUNIONECTOMY    . NASAL SEPTUM SURGERY    . RETINAL DETACHMENT SURGERY    . TOTAL KNEE ARTHROPLASTY Left 06/07/2019   Procedure: TOTAL KNEE ARTHROPLASTY;  Surgeon: Lovell Sheehan, MD;  Location: [facility] ORS;  Service: Orthopedics;  Laterality: Left;   Family History:  Family History  Problem Relation Age of Onset  . Heart disease Mother   . Alcohol abuse Father   . Breast cancer Neg Hx   . Mental illness Neg Hx    Family Psychiatric  History: None reported Social History:  Social History   Substance and Sexual Activity  Alcohol Use Yes  . Alcohol/week: 0.0 standard drinks     Social History   Substance and Sexual Activity  Drug Use No    Social History   Socioeconomic History  . Marital status: Married    Spouse name: Dazhane Lancon  . Number of children: 4  . Years of education: Not on file  . Highest education level: Not on file  Occupational History    Comment: retired  Tobacco Use  . Smoking status: Former Research scientist (life sciences)  .  Smokeless tobacco: Never Used  Vaping Use  . Vaping Use: Never used  Substance and Sexual Activity  . Alcohol use: Yes    Alcohol/week: 0.0 standard drinks  . Drug use: No  . Sexual activity: Not Currently  Other Topics Concern  . Not on file  Social History Narrative  . Not on file   Social Determinants of Health   Financial Resource Strain: Not on file  Food Insecurity: Not on file  Transportation Needs: Not on file  Physical Activity: Not on file  Stress: Not on file  Social Connections: Not on file    Additional Social History:    Allergies:   Allergies  Allergen Reactions  . Percocet [Oxycodone-Acetaminophen]     Hallucination  . Baclofen     Caused numbness in mouth   . Codeine Sulfate Nausea And Vomiting  . Gabapentin Swelling    Mouth swelling   . Terazosin   . Tramadol Hcl     Interferes with Serotonin levels which affects her Parkinsons    Labs:  Results for orders placed or performed during the hospital encounter of 08/07/20 (from the past 48 hour(s))  Comprehensive metabolic panel     Status: Abnormal   Collection Time: 08/07/20  5:27 PM  Result Value Ref Range   Sodium 143 135 - 145 mmol/L   Potassium 3.8 3.5 - 5.1 mmol/L   Chloride 110 98 - 111 mmol/L   CO2 24 22 - 32 mmol/L   Glucose, Bld 99 70 - 99 mg/dL    Comment: Glucose reference range applies only to samples taken after fasting for at least 8 hours.   BUN 26 (H) 8 - 23 mg/dL   Creatinine, Ser 0.57 0.44 - 1.00 mg/dL   Calcium 8.7 (L) 8.9 - 10.3 mg/dL   Total Protein 6.2 (L) 6.5 - 8.1 g/dL   Albumin 4.0 3.5 - 5.0 g/dL   AST 17 15 - 41 U/L   ALT <5 0 - 44 U/L   Alkaline Phosphatase 84 38 - 126 U/L   Total Bilirubin 0.9 0.3 - 1.2 mg/dL   GFR, Estimated >60 >60 mL/min    Comment: (NOTE) Calculated using the CKD-EPI Creatinine Equation (2021)    Anion gap 9 5 - 15    Comment: Performed at [facility], [address], [place], [place] [address]  Ethanol     Status: None   Collection Time: 08/07/20  5:27 PM  Result Value Ref Range   Alcohol, Ethyl (B) <10 <10 mg/dL    Comment: (NOTE) Lowest detectable limit for serum alcohol is 10 mg/dL.  For medical purposes only. Performed at [facility], [address] [place], [place] [id number]   Salicylate level     Status: Abnormal   Collection Time: 08/07/20  5:27 PM  Result Value Ref Range   Salicylate Lvl [id number] (L) 7.0 - 30.0 mg/dL    Comment: Performed at [facility], [address] [place], [place] [address]   Acetaminophen level     Status: Abnormal   Collection Time: 08/07/20  5:27 PM  Result Value Ref Range   Acetaminophen (Tylenol), Serum <10 (L) 10 - 30 ug/mL    Comment: (NOTE) Therapeutic concentrations vary significantly. A range of 10-30 ug/mL  may be an effective concentration for many patients. However, some  are best treated at concentrations outside of this range. Acetaminophen concentrations >150 ug/mL at 4 hours after ingestion  and >50 ug/mL at 12 hours after ingestion  are often associated with  toxic reactions.  Performed at [facility], [address] [place], [place] [address]   CBC with Differential     Status: Abnormal   Collection Time: 08/07/20  5:27 PM  Result Value Ref Range   WBC 5.7 4.0 - 10.5 K/uL   RBC 4.27 3.87 - 5.11 MIL/uL   Hemoglobin 11.9 (L) 12.0 - 15.0 g/dL   HCT 37.3 36.0 - 46.0 %   MCV 87.4 80.0 - 100.0 fL   MCH 27.9 26.0 - 34.0 pg   MCHC 31.9 30.0 - 36.0 g/dL   RDW 15.4 11.5 - 15.5 %   Platelets 179 150 - 400 K/uL   nRBC 0.0 0.0 - 0.2 %   Neutrophils Relative % 63 %   Neutro Abs 3.6 1.7 - 7.7 K/uL   Lymphocytes Relative 27 %   Lymphs Abs 1.5 0.7 - 4.0 K/uL   Monocytes Relative 8 %   Monocytes Absolute 0.5 0.1 - 1.0 K/uL   Eosinophils Relative 2 %   Eosinophils Absolute 0.1 0.0 - 0.5 K/uL   Basophils Relative 0 %   Basophils Absolute 0.0 0.0 - 0.1 K/uL   Immature Granulocytes 0 %   Abs Immature Granulocytes 0.01 0.00 - 0.07 K/uL    Comment: Performed at [facility], [address] [place], [place] [address]  Procalcitonin - Baseline     Status: None   Collection Time: 08/07/20  5:27 PM  Result Value Ref Range   Procalcitonin <0.10 ng/mL    Comment:        Interpretation: PCT (Procalcitonin) <= 0.5 ng/mL: Systemic infection (sepsis) is not likely. Local bacterial infection is possible. (NOTE)       Sepsis PCT Algorithm           Lower Respiratory Tract                                      Infection PCT  Algorithm    ----------------------------     ----------------------------         PCT < 0.25 ng/mL                PCT < 0.10 ng/mL          Strongly encourage             Strongly discourage   discontinuation of antibiotics    initiation of antibiotics    ----------------------------     -----------------------------       PCT 0.25 - 0.50 ng/mL            PCT 0.10 - 0.25 ng/mL               OR       >80% decrease in PCT            Discourage initiation of                                            antibiotics      Encourage discontinuation           of antibiotics    ----------------------------     -----------------------------         PCT >= 0.50 ng/mL  PCT 0.26 - 0.50 ng/mL               AND        <80% decrease in PCT             Encourage initiation of                                             antibiotics       Encourage continuation           of antibiotics    ----------------------------     -----------------------------        PCT >= 0.50 ng/mL                  PCT > 0.50 ng/mL               AND         increase in PCT                  Strongly encourage                                      initiation of antibiotics    Strongly encourage escalation           of antibiotics                                     -----------------------------                                           PCT <= 0.25 ng/mL                                                 OR                                        > 80% decrease in PCT                                      Discontinue / Do not initiate                                             antibiotics  Performed at [facility], [address] [place], [place] [address]   Urine Drug Screen, Qualitative     Status: Abnormal   Collection Time: 08/07/20  6:15 PM  Result Value Ref Range   Tricyclic, Ur Screen POSITIVE (A) NONE DETECTED   Amphetamines, Ur Screen RESULTS UNAVAILABLE DUE TO INTERFERING SUBSTANCE (A) NONE  DETECTED    Comment: RESULT CALLED TO, READ BACK BY AND VERIFIED WITH: HEWAN MEZGEVU 08/07/20 1922 KLW  MDMA (Ecstasy)Ur Screen NONE DETECTED NONE DETECTED   Cocaine Metabolite,Ur [place] NONE DETECTED NONE DETECTED   Opiate, Ur Screen RESULTS UNAVAILABLE DUE TO INTERFERING SUBSTANCE (A) NONE DETECTED    Comment: RESULT CALLED TO, READ BACK BY AND VERIFIED WITH: HEWAN MEZGEVU 08/07/20 1922 KLW    Phencyclidine (PCP) Ur S RESULTS UNAVAILABLE DUE TO INTERFERING SUBSTANCE (A) NONE DETECTED    Comment: RESULT CALLED TO, READ BACK BY AND VERIFIED WITH: HEWAN MEZGEVU 08/07/20 1922 KLW    Cannabinoid 50 Ng, Ur [place] RESULTS UNAVAILABLE DUE TO INTERFERING SUBSTANCE (A) NONE DETECTED    Comment: RESULT CALLED TO, READ BACK BY AND VERIFIED WITH: HEWAN MEZGEVU 08/07/20 1922 KLW    Barbiturates, Ur Screen NONE DETECTED NONE DETECTED   Benzodiazepine, Ur Scrn RESULTS UNAVAILABLE DUE TO INTERFERING SUBSTANCE (A) NONE DETECTED    Comment: RESULT CALLED TO, READ BACK BY AND VERIFIED WITH: HEWAN MEZGEVU 08/07/20 1922 KLW    Methadone Scn, Ur NONE DETECTED NONE DETECTED    Comment: (NOTE) Tricyclics + metabolites, urine    Cutoff 1000 ng/mL Amphetamines + metabolites, urine  Cutoff 1000 ng/mL MDMA (Ecstasy), urine              Cutoff 500 ng/mL Cocaine Metabolite, urine          Cutoff 300 ng/mL Opiate + metabolites, urine        Cutoff 300 ng/mL Phencyclidine (PCP), urine         Cutoff 25 ng/mL Cannabinoid, urine                 Cutoff 50 ng/mL Barbiturates + metabolites, urine  Cutoff 200 ng/mL Benzodiazepine, urine              Cutoff 200 ng/mL Methadone, urine                   Cutoff 300 ng/mL  The urine drug screen provides only a preliminary, unconfirmed analytical test result and should not be used for non-medical purposes. Clinical consideration and professional judgment should be applied to any positive drug screen result due to possible interfering substances. A more specific alternate  chemical method must be used in order to obtain a confirmed analytical result. Gas chromatography / mass spectrometry (GC/MS) is the preferred confirm atory method. Performed at [facility], [address] [place], [place] [address]   Urinalysis, Complete w Microscopic     Status: Abnormal   Collection Time: 08/07/20  6:15 PM  Result Value Ref Range   Color, Urine AMBER (A) YELLOW    Comment: BIOCHEMICALS MAY BE AFFECTED BY COLOR   APPearance CLOUDY (A) CLEAR   Specific Gravity, Urine 1.039 (H) 1.005 - 1.030   pH 5.0 5.0 - 8.0   Glucose, UA NEGATIVE NEGATIVE mg/dL   Hgb urine dipstick SMALL (A) NEGATIVE   Bilirubin Urine SMALL (A) NEGATIVE   Ketones, ur 5 (A) NEGATIVE mg/dL   Protein, ur 30 (A) NEGATIVE mg/dL   Nitrite NEGATIVE NEGATIVE   Leukocytes,Ua NEGATIVE NEGATIVE   WBC, UA 6-10 0 - 5 WBC/hpf   Bacteria, UA NONE SEEN NONE SEEN   Squamous Epithelial / LPF NONE SEEN 0 - 5   Mucus PRESENT     Comment: Performed at [facility], [address] [place], [place] [address]  Resp Panel by RT-PCR (Flu A&B, Covid) Nasopharyngeal Swab     Status: None   Collection Time: 08/07/20  8:52 PM   Specimen: Nasopharyngeal Swab; Nasopharyngeal(NP) swabs in  vial transport medium  Result Value Ref Range   SARS Coronavirus 2 by RT PCR NEGATIVE NEGATIVE    Comment: (NOTE) SARS-CoV-2 target nucleic acids are NOT DETECTED.  The SARS-CoV-2 RNA is generally detectable in upper respiratory specimens during the acute phase of infection. The lowest concentration of SARS-CoV-2 viral copies this assay can detect is 138 copies/mL. A negative result does not preclude SARS-Cov-2 infection and should not be used as the sole basis for treatment or other patient management decisions. A negative result may occur with  improper specimen collection/handling, submission of specimen other than nasopharyngeal swab, presence of viral mutation(s) within the areas targeted by this assay, and  inadequate number of viral copies(<138 copies/mL). A negative result must be combined with clinical observations, patient history, and epidemiological information. The expected result is Negative.  Fact Sheet for Patients:  EntrepreneurPulse.com.au  Fact Sheet for Healthcare Providers:  IncredibleEmployment.be  This test is no t yet approved or cleared by the [place] FDA and  has been authorized for detection and/or diagnosis of SARS-CoV-2 by FDA under an Emergency Use Authorization (EUA). This EUA will remain  in effect (meaning this test can be used) for the duration of the COVID-19 declaration under Section 564(b)(1) of the Act, 21 U.S.C.section 360bbb-3(b)(1), unless the authorization is terminated  or revoked sooner.       Influenza A by PCR NEGATIVE NEGATIVE   Influenza B by PCR NEGATIVE NEGATIVE    Comment: (NOTE) The Xpert Xpress SARS-CoV-2/FLU/RSV plus assay is intended as an aid in the diagnosis of influenza from Nasopharyngeal swab specimens and should not be used as a sole basis for treatment. Nasal washings and aspirates are unacceptable for Xpert Xpress SARS-CoV-2/FLU/RSV testing.  Fact Sheet for Patients: EntrepreneurPulse.com.au  Fact Sheet for Healthcare Providers: IncredibleEmployment.be  This test is not yet approved or cleared by the [place] FDA and has been authorized for detection and/or diagnosis of SARS-CoV-2 by FDA under an Emergency Use Authorization (EUA). This EUA will remain in effect (meaning this test can be used) for the duration of the COVID-19 declaration under Section 564(b)(1) of the Act, 21 U.S.C. section 360bbb-3(b)(1), unless the authorization is terminated or revoked.  Performed at [facility], [address], [place], [place] [address]     Current Facility-Administered Medications  Medication Dose Route Frequency Provider Last Rate  Last Admin  . acetaminophen (TYLENOL) tablet 500 mg  500 mg Oral BID Nena Polio, MD   500 mg at 08/08/20 [id number]  . Carbidopa-Levodopa ER (SINEMET CR) 25-100 MG tablet controlled release 2 tablet  2 tablet Oral Q2H Renda Rolls, RPH   2 tablet at 08/08/20 [id number]   And  . Carbidopa-Levodopa ER (SINEMET CR) 25-100 MG tablet controlled release 1-2 tablet  1-2 tablet Oral QHS PRN Renda Rolls, RPH      . clonazePAM (KLONOPIN) tablet 1 mg  1 mg Oral QID Nena Polio, MD   1 mg at 08/08/20 [id number]  . clotrimazole-betamethasone (LOTRISONE) cream 1 application  1 application Topical BID Nena Polio, MD   1 application at [id number] 0945  . entacapone (COMTAN) tablet 200 mg  200 mg Oral QID Nena Polio, MD   200 mg at 08/08/20 0944  . escitalopram (LEXAPRO) tablet 10 mg  10 mg Oral Daily Nena Polio, MD   10 mg at 08/08/20 [id number]  . melatonin tablet 5 mg  5 mg Oral QHS Nena Polio, MD   5 mg at  08/07/20 2306  . neomycin-polymyxin b-dexamethasone (MAXITROL) ophthalmic suspension 1 drop  1 drop Left Eye Daily Arnaldo NatalMalinda, Paul F, MD   1 drop at 08/08/20 0947  . pantoprazole (PROTONIX) EC tablet 20 mg  20 mg Oral Daily Arnaldo NatalMalinda, Paul F, MD      . potassium chloride (KLOR-CON) CR tablet 10 mEq  10 mEq Oral Daily Arnaldo NatalMalinda, Paul F, MD   10 mEq at 08/08/20 0944  . prednisoLONE acetate (PRED FORTE) 1 % ophthalmic suspension 1 drop  1 drop Left Eye QID Arnaldo NatalMalinda, Paul F, MD   1 drop at 08/08/20 0945  . pregabalin (LYRICA) capsule 100 mg  100 mg Oral TID Arnaldo NatalMalinda, Paul F, MD   100 mg at 08/08/20 0943  . QUEtiapine (SEROQUEL) tablet 100 mg  100 mg Oral QHS Arnaldo NatalMalinda, Paul F, MD   100 mg at 08/07/20 2305  . QUEtiapine (SEROQUEL) tablet 50 mg  50 mg Oral TID Arnaldo NatalMalinda, Paul F, MD   50 mg at 08/08/20 [id number]  . traZODone (DESYREL) tablet 25-50 mg  25-50 mg Oral QHS Arnaldo NatalMalinda, Paul F, MD   50 mg at 08/07/20 2307  . trifluoperazine (STELAZINE) tablet 1 mg  1 mg Oral BID Arnaldo NatalMalinda, Paul F, MD   1 mg at 08/08/20 [id number]    Current Outpatient Medications  Medication Sig Dispense Refill  . acetaminophen (TYLENOL) 500 MG tablet Take 500 mg by mouth 2 (two) times daily.     [place] Kitchen. ALPRAZolam (XANAX) 0.25 MG tablet Take 0.25 mg by mouth 2 (two) times daily as needed.    . Carbidopa-Levodopa ER (SINEMET CR) 25-100 MG tablet controlled release TAKE 2 EVERY 2 HOURS TAKE AN EXTRA 1-2 TABLETS AT NIGHT FOR CHEST TIGHTNESS    . clonazePAM (KLONOPIN) 2 MG tablet Take 1 mg by mouth 4 (four) times daily.    . clotrimazole-betamethasone (LOTRISONE) cream Apply 1 application topically 2 (two) times daily. 45 g 3  . entacapone (COMTAN) 200 MG tablet Take 1 tablet (200 mg total) by mouth 4 (four) times daily. 120 tablet 0  . escitalopram (LEXAPRO) 10 MG tablet Take 10 mg by mouth daily.    [place] Kitchen. HYDROcodone-acetaminophen (NORCO/VICODIN) 5-325 MG tablet Take by mouth.     . melatonin 5 MG TABS Take 1 tablet (5 mg total) by mouth at bedtime.  0  . neomycin-polymyxin-dexamethasone (MAXITROL) 0.1 % ophthalmic suspension Place 1 drop into the left eye daily.     . pantoprazole (PROTONIX) 20 MG tablet TAKE 1 TABLET BY MOUTH EVERY DAY 30 tablet 1  . potassium chloride (KLOR-CON) 10 MEQ tablet TAKE 1 TABLET BY MOUTH EVERY DAY 30 tablet 1  . prednisoLONE acetate (PRED FORTE) 1 % ophthalmic suspension Place 1 drop into the left eye 4 (four) times daily.     . pregabalin (LYRICA) 100 MG capsule Take 100 mg by mouth 3 (three) times daily.    . QUEtiapine (SEROQUEL) 100 MG tablet Take 1 tablet (100 mg total) by mouth at bedtime. 30 tablet 1  . QUEtiapine (SEROQUEL) 50 MG tablet Take 1 tablet (50 mg total) by mouth 3 (three) times daily. 90 tablet 1  . traZODone (DESYREL) 50 MG tablet Take 25-50 mg by mouth at bedtime.     [place] Kitchen. trifluoperazine (STELAZINE) 1 MG tablet Take 1 tablet (1 mg total) by mouth 2 (two) times daily. 30 tablet 1    Musculoskeletal: Strength & Muscle Tone: decreased Gait & Station: unsteady Patient leans: N/A  Psychiatric  Specialty Exam: Physical Exam Vitals and  nursing note reviewed.  Constitutional:      Appearance: She is well-developed and well-nourished.  HENT:     Head: Normocephalic and atraumatic.  Eyes:     Conjunctiva/sclera: Conjunctivae normal.     Pupils: Pupils are equal, round, and reactive to light.  Cardiovascular:     Heart sounds: Normal heart sounds.  Pulmonary:     Effort: Pulmonary effort is normal.  Abdominal:     Palpations: Abdomen is soft.  Musculoskeletal:        General: Normal range of motion.     Cervical back: Normal range of motion.  Skin:    General: Skin is warm and dry.  Neurological:     General: No focal deficit present.     Mental Status: She is alert.     Motor: Weakness present.  Psychiatric:        Attention and Perception: She is inattentive.        Mood and Affect: Affect is blunt.        Cognition and Memory: Cognition is impaired.     Review of Systems  Unable to perform ROS: Mental status change    Blood pressure 115/73, pulse 70, temperature 98.1 F (36.7 C), temperature source Oral, resp. rate 18, height 5\' 3"  (1.6 m), weight 68 kg, SpO2 96 %.Body mass index is 26.57 kg/m.  General Appearance: Casual  Eye Contact:  Minimal  Speech:  Slow  Volume:  Decreased  Mood:  Negative  Affect:  Negative  Thought Process:  Disorganized  Orientation:  Negative  Thought Content:  Negative  Suicidal Thoughts:  No  Homicidal Thoughts:  No  Memory:  Negative  Judgement:  Negative  Insight:  Negative  Psychomotor Activity:  Negative  Concentration:  Concentration: Poor  Recall:  Poor  Fund of Knowledge:  Poor  Language:  Poor  Akathisia:  No  Handed:  Right  AIMS (if indicated):     Assets:  Social Support  ADL's:  Impaired  Cognition:  Impaired,  Moderate  Sleep:        Treatment Plan Summary: Medication management and Plan Spoke with patient's husband.  Reviewed recommendations from nurse practitioner last night.  Patient does not  require inpatient hospitalization.  Husband is willing to have her come back home.  I was agreeable with his request that we increase the Seroquel dose back up.  I have put prescription orders into his local pharmacy for Seroquel 100 mg at night and 50 mg 3 times a day.  They are to follow-up with her primary care doctor and neurologist.  Case reviewed with emergency room physician.  Patient can be released from the ER.  She is not under commitment.  Disposition: No evidence of imminent risk to self or others at present.    Alethia Berthold, MD 08/08/2020 12:43 PM

## 2020-08-08 NOTE — ED Notes (Signed)
Pt assisted to bathroom. Clean brief put on.   Pt uses wheelchair and needed 2 person assist.
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
## 2020-08-08 NOTE — ED Notes (Signed)
Pt wanting to sleep at this time. VS will be taken once awake.

## 2020-08-08 NOTE — ED Notes (Signed)
Pt discharged home with husband. VS stable. All belongings returned to patient. Discharge instructions reviewed with pt's husband. Pt denies SI/HI.

## 2020-08-08 NOTE — ED Notes (Signed)
Pt assisted to bathroom with walker and this Clinical research associate. Pt dressed for discharge.

## 2020-08-08 NOTE — ED Notes (Signed)
Snack and beverage given. 

## 2020-08-18 ENCOUNTER — Other Ambulatory Visit: Payer: Self-pay

## 2020-08-18 ENCOUNTER — Emergency Department
Admission: EM | Admit: 2020-08-18 | Discharge: 2020-08-18 | Disposition: A | Discharge status: Home / Self Care | Payer: Medicare Other | Attending: Emergency Medicine | Admitting: Emergency Medicine

## 2020-08-18 ENCOUNTER — Emergency Department: Payer: Medicare Other

## 2020-08-18 DIAGNOSIS — G20 Parkinson's disease: Secondary | ICD-10-CM | POA: Insufficient documentation

## 2020-08-18 DIAGNOSIS — S0990XA Unspecified injury of head, initial encounter: Secondary | ICD-10-CM | POA: Diagnosis not present

## 2020-08-18 DIAGNOSIS — S199XXA Unspecified injury of neck, initial encounter: Secondary | ICD-10-CM | POA: Diagnosis present

## 2020-08-18 DIAGNOSIS — R296 Repeated falls: Secondary | ICD-10-CM | POA: Diagnosis not present

## 2020-08-18 DIAGNOSIS — Z96652 Presence of left artificial knee joint: Secondary | ICD-10-CM | POA: Diagnosis not present

## 2020-08-18 DIAGNOSIS — Y92009 Unspecified place in unspecified non-institutional (private) residence as the place of occurrence of the external cause: Secondary | ICD-10-CM | POA: Diagnosis not present

## 2020-08-18 DIAGNOSIS — Z87891 Personal history of nicotine dependence: Secondary | ICD-10-CM | POA: Diagnosis not present

## 2020-08-18 DIAGNOSIS — F0391 Unspecified dementia with behavioral disturbance: Secondary | ICD-10-CM | POA: Insufficient documentation

## 2020-08-18 DIAGNOSIS — S139XXA Sprain of joints and ligaments of unspecified parts of neck, initial encounter: Secondary | ICD-10-CM | POA: Diagnosis not present

## 2020-08-18 DIAGNOSIS — W19XXXA Unspecified fall, initial encounter: Secondary | ICD-10-CM

## 2020-08-18 DIAGNOSIS — W01198A Fall on same level from slipping, tripping and stumbling with subsequent striking against other object, initial encounter: Secondary | ICD-10-CM | POA: Diagnosis not present

## 2020-08-18 NOTE — ED Provider Notes (Signed)
[facility] Emergency Department Provider Note   ____________________________________________    I have reviewed the triage vital signs and the nursing notes.   HISTORY  Chief Complaint Fall and Back Pain     HPI Sarah Donaldson is a 70 y.o. female with history of Parkinson's who has frequent falls presents today after a fall.  Husband reports that she hit her head against a cast iron log holder by the fireplace.  Patient complains of mild neck pain, no LOC reported.  Did complain of low back paiN initially but not now.  Past Medical History:  Diagnosis Date  . Cancer ([place])    Non-hodgkin's lymphoma (in remission)  . GERD (gastroesophageal reflux disease)    well-controlled w/ omeprazole  . Hyperlipidemia   . Knee joint cyst, left 02/07/2018  . Parkinson disease ([place])   . Parkinson disease [facility])     Patient Active Problem List   Diagnosis Date Noted  . Essential hypertension 06/02/2020  . Need for influenza vaccination 06/02/2020  . Encounter for annual health check of caregiver 04/08/2020  . Altered mental status   . Dementia due to Parkinson's disease with behavioral disturbance ([place])   . SDH (subdural hematoma) ([place]) 12/11/2019  . Falls frequently 12/11/2019  . S/P total knee replacement, left 06/07/2019  . Osteoarthritis of knee 04/27/2019  . Panic attacks 04/07/2019  . Psychosis ([place]) 04/04/2019  . Numbness 03/09/2018  . Lumbar spondylosis 03/07/2018  . Chronic pain of left knee 03/07/2018  . Chronic upper back pain 12/08/2017  . Benign neoplasm of hand 10/20/2017  . Chronic pain syndrome 10/20/2017  . Chronic pain of right lower extremity 10/20/2017  . Chronic myofascial pain 10/20/2017  . Numbness and tingling 07/12/2017  . Sleep difficulties 07/12/2017  . Low back pain 01/18/2017  . Sleep behavior disorder, REM 01/18/2017  . Lumbar radiculopathy 11/01/2016  . Follicular lymphoma of extranodal and solid organ sites [facility])  05/01/2016  . Lymphoma ([place]) 04/30/2016  . Hallux limitus 07/27/2015  . Low back pain with sciatica 02/13/2015  . Bilateral low back pain with sciatica 02/13/2015  . H/O adenomatous polyp of colon 04/06/2014  . Hx of adenomatous colonic polyps 04/06/2014  . Back ache 01/25/2014  . Adiposity 01/25/2014  . REM sleep behavior disorder 01/25/2014  . Disordered sleep 01/25/2014  . Has a tremor 01/25/2014  . Obesity, unspecified 01/25/2014  . Sleep disorder 01/25/2014  . Backache 01/25/2014  . Tremor 01/25/2014  . Obesity 01/25/2014  . Difficulty hearing 06/08/2012  . Hearing loss 06/08/2012  . Anxiety disorder 06/06/2012  . Colon polyp 06/06/2012  . Clinical depression 06/06/2012  . Hypercholesteremia 06/06/2012  . Idiopathic Parkinson's disease ([place]) 06/06/2012  . Detached retina 06/06/2012  . Depressive disorder 06/06/2012  . Retinal detachment 06/06/2012  . Parkinson's disease ([place]) 06/06/2012    Past Surgical History:  Procedure Laterality Date  . BACK SURGERY    . BUNIONECTOMY    . NASAL SEPTUM SURGERY    . RETINAL DETACHMENT SURGERY    . TOTAL KNEE ARTHROPLASTY Left 06/07/2019   Procedure: TOTAL KNEE ARTHROPLASTY;  Surgeon: Lovell Sheehan, MD;  Location: [facility] ORS;  Service: Orthopedics;  Laterality: Left;    Prior to Admission medications   Medication Sig Start Date End Date Taking? Authorizing Provider  acetaminophen (TYLENOL) 500 MG tablet Take 500 mg by mouth 2 (two) times daily.     [provider]  ALPRAZolam Duanne Moron) 0.25 MG tablet Take 0.25 mg by mouth  2 (two) times daily as needed. 04/01/20   [provider]  Carbidopa-Levodopa ER (SINEMET CR) 25-100 MG tablet controlled release TAKE 2 EVERY 2 HOURS TAKE AN EXTRA 1-2 TABLETS AT NIGHT FOR CHEST TIGHTNESS    [provider]  clonazePAM (KLONOPIN) 2 MG tablet Take 1 mg by mouth 4 (four) times daily. 04/03/20   [provider]  clotrimazole-betamethasone (LOTRISONE) cream Apply 1  application topically 2 (two) times daily. 07/16/20   Edrick Kins, DPM  entacapone (COMTAN) 200 MG tablet Take 1 tablet (200 mg total) by mouth 4 (four) times daily. 03/30/20   Patrecia Pour, NP  escitalopram (LEXAPRO) 10 MG tablet Take 10 mg by mouth daily.    [provider]  HYDROcodone-acetaminophen (NORCO/VICODIN) 5-325 MG tablet Take by mouth.  04/02/20   [provider]  melatonin 5 MG TABS Take 1 tablet (5 mg total) by mouth at bedtime. 12/13/19   Ezekiel Slocumb, DO  neomycin-polymyxin-dexamethasone (MAXITROL) 0.1 % ophthalmic suspension Place 1 drop into the left eye daily.     [provider]  pantoprazole (PROTONIX) 20 MG tablet TAKE 1 TABLET BY MOUTH EVERY DAY 05/28/20   Patrecia Pour, NP  potassium chloride (KLOR-CON) 10 MEQ tablet TAKE 1 TABLET BY MOUTH EVERY DAY 05/06/20   Cletis [place], MD  prednisoLONE acetate (PRED FORTE) 1 % ophthalmic suspension Place 1 drop into the left eye 4 (four) times daily.     [provider]  pregabalin (LYRICA) 100 MG capsule Take 100 mg by mouth 3 (three) times daily. 11/18/19   [provider]  QUEtiapine (SEROQUEL) 100 MG tablet Take 1 tablet (100 mg total) by mouth at bedtime. 08/08/20   Clapacs, Madie Reno, MD  QUEtiapine (SEROQUEL) 50 MG tablet Take 1 tablet (50 mg total) by mouth 3 (three) times daily. 08/08/20   Clapacs, Madie Reno, MD  traZODone (DESYREL) 50 MG tablet Take 25-50 mg by mouth at bedtime.  10/04/18   [provider]  trifluoperazine (STELAZINE) 1 MG tablet Take 1 tablet (1 mg total) by mouth 2 (two) times daily. 03/30/20   Patrecia Pour, NP     Allergies Percocet [oxycodone-acetaminophen], Baclofen, Codeine sulfate, Gabapentin, Terazosin, and Tramadol hcl  Family History  Problem Relation Age of Onset  . Heart disease Mother   . Alcohol abuse Father   . Breast cancer Neg Hx   . Mental illness Neg Hx     Social History Social History   Tobacco Use  . Smoking status:  Former Research scientist (life sciences)  . Smokeless tobacco: Never Used  Vaping Use  . Vaping Use: Never used  Substance Use Topics  . Alcohol use: Yes    Alcohol/week: 0.0 standard drinks  . Drug use: No    Review of Systems  Constitutional: No fever/chills Eyes: No visual changes.  ENT: No facial injury Cardiovascular: Denies chest pain. Respiratory: Denies shortness of breath. Gastrointestinal: No abdominal pain.  No nausea, no vomiting.   Genitourinary: Negative for groin injury Musculoskeletal: As above Skin: Negative for laceration Neurological: No new weakness   ____________________________________________   PHYSICAL EXAM:  VITAL SIGNS: ED Triage Vitals  Enc Vitals Group     BP --      Pulse Rate 08/18/20 1957 73     Resp 08/18/20 1957 20     Temp 08/18/20 1957 98 F (36.7 C)     Temp Source 08/18/20 1957 Oral     SpO2 08/18/20 1957 98 %  Weight 08/18/20 1958 65.8 kg (145 lb)     Height 08/18/20 1958 1.651 m (5\' 5" )     Head Circumference --      Peak Flow --      Pain Score --      Pain Loc --      Pain Edu? --      Excl. in [place]? --     Constitutional: Alert No acute distress Eyes: Conjunctivae are normal.  Head: Atraumatic. Nose: No swelling epistaxis Mouth/Throat: Mucous membranes are moist.   Neck: Mild tenderness along lateral posterior neck, no vertebral tenderness palpation Cardiovascular: Normal rate, regular rhythm.  Good peripheral circulation.  No chest wall tenderness palpation Respiratory: Normal respiratory effort.  No retractions. Gastrointestinal: Soft and nontender. No distention. Musculoskeletal: Normal range of motion of all extremities warm and well perfused Neurologic:  Normal speech and language. No gross focal neurologic deficits are appreciated.  Skin:  Skin is warm, dry and intact. No rash noted. Psychiatric: Mood and affect are normal. Speech and behavior are normal.  ____________________________________________   LABS (all labs ordered are  listed, but only abnormal results are displayed)  Labs Reviewed - No data to display ____________________________________________  EKG  None ____________________________________________  RADIOLOGY  CT cervical spine and CT head unremarkable ____________________________________________   PROCEDURES  Procedure(s) performed: No  Procedures   Critical Care performed: No ____________________________________________   INITIAL IMPRESSION / ASSESSMENT AND PLAN / ED COURSE  Pertinent labs & imaging results that were available during my care of the patient were reviewed by me and considered in my medical decision making (see chart for details).  Patient presents after a mechanical fall as described above, CT scans are reassuring, exam is reassuring as well.  Encourage patient to be sure to use her walker at all times.  Appropriate for discharge at this time    ____________________________________________   FINAL CLINICAL IMPRESSION(S) / ED DIAGNOSES  Final diagnoses:  Fall, initial encounter  Injury of head, initial encounter  Cervical sprain, initial encounter        Note:  This document was prepared using Dragon voice recognition software and may include unintentional dictation errors.   Lavonia Drafts, MD 08/18/20 [phone]
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
## 2020-08-18 NOTE — ED Triage Notes (Signed)
Patient reports she fell.  Hit her head and has lower back pain.

## 2020-08-21 ENCOUNTER — Telehealth: Payer: Self-pay | Admitting: Nurse Practitioner

## 2020-08-21 NOTE — Telephone Encounter (Signed)
Spoke with patient's husband, Jenny Reichmann, regarding the Palliative referral/services and he was in agreement with scheduling a visit.  I have scheduled an In-person Consult for 08/22/20 @ 1 PM.

## 2020-08-22 ENCOUNTER — Other Ambulatory Visit: Payer: Medicare Other | Admitting: Nurse Practitioner

## 2020-08-22 ENCOUNTER — Other Ambulatory Visit: Payer: Self-pay

## 2020-08-22 ENCOUNTER — Encounter: Payer: Self-pay | Admitting: Nurse Practitioner

## 2020-08-22 DIAGNOSIS — Z515 Encounter for palliative care: Secondary | ICD-10-CM

## 2020-08-22 DIAGNOSIS — Z85828 Personal history of other malignant neoplasm of skin: Secondary | ICD-10-CM | POA: Insufficient documentation

## 2020-08-22 DIAGNOSIS — S0181XA Laceration without foreign body of other part of head, initial encounter: Secondary | ICD-10-CM | POA: Insufficient documentation

## 2020-08-22 DIAGNOSIS — W1839XA Other fall on same level, initial encounter: Secondary | ICD-10-CM | POA: Diagnosis not present

## 2020-08-22 DIAGNOSIS — G20 Parkinson's disease: Secondary | ICD-10-CM

## 2020-08-22 DIAGNOSIS — Z87891 Personal history of nicotine dependence: Secondary | ICD-10-CM | POA: Insufficient documentation

## 2020-08-22 DIAGNOSIS — S0990XA Unspecified injury of head, initial encounter: Secondary | ICD-10-CM | POA: Diagnosis present

## 2020-08-22 DIAGNOSIS — Z79899 Other long term (current) drug therapy: Secondary | ICD-10-CM | POA: Insufficient documentation

## 2020-08-22 DIAGNOSIS — I10 Essential (primary) hypertension: Secondary | ICD-10-CM | POA: Diagnosis not present

## 2020-08-22 DIAGNOSIS — F0390 Unspecified dementia without behavioral disturbance: Secondary | ICD-10-CM | POA: Insufficient documentation

## 2020-08-22 DIAGNOSIS — Z96652 Presence of left artificial knee joint: Secondary | ICD-10-CM | POA: Diagnosis not present

## 2020-08-22 NOTE — Progress Notes (Signed)
Therapist, [facility] Consult Note Telephone: [phone]  Fax: [phone]  PATIENT NAME: Sarah Donaldson DOB: 08/03/1951 MRN: [id number]  PRIMARY CARE PROVIDER:   Corky DownsMasoud, Javed, MD  REFERRING PROVIDER:  Corky DownsMasoud, Javed, MD [address] [place] [id number]  RESPONSIBLE PARTY:   Husband Lorine BearsJohn Servais [phone] or [phone]  I was asked to see Sarah. Dayton Donaldson for Palliative care consult for complex medical decision making by Dr Juel BurrowMasoud.   1. Advance Care Planning; DNR, completed and MOST form, placed in Vynca   2. Goals of Care: Goals include to maximize quality of life and symptom management. Our advance care planning conversation included a discussion about:     The value and importance of advance care planning   Exploration of personal, cultural or spiritual beliefs that might influence medical decisions   Exploration of goals of care in the event of a sudden injury or illness   Identification and preparation of a healthcare agent   Review and updating or creation of an  advance directive document.   3. Palliative care encounter; Palliative care encounter; Palliative medicine team will continue to support patient, patient's family, and medical team. Visit consisted of counseling and education dealing with the complex and emotionally intense issues of symptom management and palliative care in the setting of serious and potentially life-threatening illness   4. f/u 1 month for ongoing monitoring chronic disease progression, ongoing discussions complex medical decision making  I spent 90 minutes providing this consultation,  from 1:00pm to 2:30pm. More than 50% of the time in this consultation was spent coordinating communication.   HISTORY OF PRESENT ILLNESS:  Sarah Donaldson is a 70 y.o. year old female with multiple medical problems including Parkinson's, dementia secondary to Parkinson's disease with behavioral disturbances,  Non-Hodgkins Lymphoma, subdural hematoma, gerd, hyperlipidemia, obesity, panic attacks, lumbar spondylosis, back surgery, retinal detachment surgery, total knee arthroplasty. Sarah Donaldson lives at home with her husband Sarah Donaldson. They have been married over 50 years with four children. Sarah. Dayton Donaldson endorses that [place]. We talked about purpose of palliative care visit. Mr. and Sarah. Guadiana both in agreement. We talked about the last time Sarah. Dayton Donaldson was independent. Sarah Donaldson endorses she is independent though she does walk with a walker. Sarah. Dayton Donaldson does do her own fast but requires assistance with dressing. Sarah. Dayton Donaldson does feed herself and appetite has been good. Sarah. Dayton Donaldson endorses she has not lost anyway. Sarah. Dayton Donaldson endorses she has not had any trouble with swallowing, choking or coughing with eating. We talked at length about initial diagnosis of Parkinson's when she was in her forties due to a Tremor of her left hand that progressed to her right hand. The tremor progressed to her lower extremities. We talked about diagnosis of dementia. Mr Dayton Donaldson endorses she was diagnosed and has dementia. Sarah Donaldson became irritable with Mr. Dayton Donaldson and endorses she does not have dementia. We talked about symptoms of hallucinations. Mr Dayton Donaldson endorses she has had [facility]. We talked about events that led up to the hospitalization including behavior such as rolling in the grass in the yard in the rain. We talked about medications and recent medication changes. We talked about follow up outpatient Psychiatry. Mr Dayton Donaldson endorses they contacted multiple Psychiatrist that were recommended but have not heard back from anyone to schedule a follow-up appointment. Mr. Dayton Donaldson endorses Dr. Malvin JohnsPotter the neurologist  does follow. Recommendation when concerned for behaviors are to go to the emergency department. We talked about seeing if there is a Psychiatry  practice that does in-home visit. Sarah. Mcfayden endorses she prefers to have someone come to her home to see her. We talked about family dynamics says they have four adult children. Sarah Briones endorses she never worked outside the home. Mr. Jamroz was a retired Therapist, sports and then Arts administrator in [place] as well as Comptroller when they relocated. We talked about challenges at home with behaviors around 4pm daily. With recent medication changes it seems to have helped a little bit for Mr Warshaw. Mr Pellegrino endorses that Sarah Tedrick does become irritable often. Mr. Tolles endorses both times Sarah Boardley was discharged from [facility] she came home with physical therapy. Sarah Virden does continue to have falls. Sarah Salley walks with her walker. Sarah Conteh has been working with therapist that seem to have improved although that recently was stopped. We talked about symptoms of pain what she does exhibit and recently went to see Orthopedic. We talked about alternating hot and cold to the area in addition to Voltaren gel and extra strength Tylenol which seems to help. Mr Tout endorses Orthopedic just restarted hydrocodone. Sarah. Oertel endorses the hydrocodone does not seem to work as well as the extra strength Tylenol. We talked about medical goals of care. Mr Guardado endorses they have been working with an elder attorney to get their affairs in order including planning for if something were to happen to Mr. Dattilio who would care for Sarah. Hibberd and funds available to do so. We talked about medical goals of care including aggressive versus conservative versus comfort care. We talked about code status. We reviewed advance directives. Wishes are for DNR. [place] form completed. We talked about most form and reviewed. Completed most form to read wishes for DNR, limited intervention, IV fluids, antibiotics, no tube feedings. We talked about role of Palliative care and plan of care. Discuss that will follow up to  see if an in-home Psychiatrist can follow and will update Mr Venturini. We talked about follow up Palliative care visit in 4 weeks if needed or sooner should she declined. Mr and Sarah. Valere Dross both in agreement, appointment scheduled. There. Listening and emotional support provided. Contact information. Questions answered to satisfaction  Palliative Care was asked to help address goals of care.   CODE STATUS: DNR  PPS: 50% HOSPICE ELIGIBILITY/DIAGNOSIS: TBD  PAST MEDICAL HISTORY:  Past Medical History:  Diagnosis Date  . Cancer ([place])    Non-hodgkin's lymphoma (in remission)  . GERD (gastroesophageal reflux disease)    well-controlled w/ omeprazole  . Hyperlipidemia   . Knee joint cyst, left 02/07/2018  . Parkinson disease ([place])   . Parkinson disease ([place])     SOCIAL HX:  Social History   Tobacco Use  . Smoking status: Former Research scientist (life sciences)  . Smokeless tobacco: Never Used  Substance Use Topics  . Alcohol use: Yes    Alcohol/week: 0.0 standard drinks    ALLERGIES:  Allergies  Allergen Reactions  . Percocet [Oxycodone-Acetaminophen]     Hallucination  . Baclofen     Caused numbness in mouth   . Codeine Sulfate Nausea And Vomiting  . Gabapentin Swelling    Mouth swelling   . Terazosin   . Tramadol Hcl     Interferes with Serotonin levels which affects her Parkinsons     PERTINENT MEDICATIONS:  Outpatient Encounter Medications as  of 08/22/2020  Medication Sig  . acetaminophen (TYLENOL) 500 MG tablet Take 500 mg by mouth 2 (two) times daily.   Marland Kitchen ALPRAZolam (XANAX) 0.25 MG tablet Take 0.25 mg by mouth 2 (two) times daily as needed.  . Carbidopa-Levodopa ER (SINEMET CR) 25-100 MG tablet controlled release TAKE 2 EVERY 2 HOURS TAKE AN EXTRA 1-2 TABLETS AT NIGHT FOR CHEST TIGHTNESS  . clonazePAM (KLONOPIN) 2 MG tablet Take 1 mg by mouth 4 (four) times daily.  . clotrimazole-betamethasone (LOTRISONE) cream Apply 1 application topically 2 (two) times daily.  . entacapone (COMTAN) 200 MG  tablet Take 1 tablet (200 mg total) by mouth 4 (four) times daily.  Marland Kitchen escitalopram (LEXAPRO) 10 MG tablet Take 10 mg by mouth daily.  Marland Kitchen HYDROcodone-acetaminophen (NORCO/VICODIN) 5-325 MG tablet Take by mouth.   . melatonin 5 MG TABS Take 1 tablet (5 mg total) by mouth at bedtime.  Marland Kitchen neomycin-polymyxin-dexamethasone (MAXITROL) 0.1 % ophthalmic suspension Place 1 drop into the left eye daily.   . pantoprazole (PROTONIX) 20 MG tablet TAKE 1 TABLET BY MOUTH EVERY DAY  . potassium chloride (KLOR-CON) 10 MEQ tablet TAKE 1 TABLET BY MOUTH EVERY DAY  . prednisoLONE acetate (PRED FORTE) 1 % ophthalmic suspension Place 1 drop into the left eye 4 (four) times daily.   . pregabalin (LYRICA) 100 MG capsule Take 100 mg by mouth 3 (three) times daily.  . QUEtiapine (SEROQUEL) 100 MG tablet Take 1 tablet (100 mg total) by mouth at bedtime.  Marland Kitchen QUEtiapine (SEROQUEL) 50 MG tablet Take 1 tablet (50 mg total) by mouth 3 (three) times daily.  . traZODone (DESYREL) 50 MG tablet Take 25-50 mg by mouth at bedtime.   Marland Kitchen trifluoperazine (STELAZINE) 1 MG tablet Take 1 tablet (1 mg total) by mouth 2 (two) times daily.   No facility-administered encounter medications on file as of 08/22/2020.    PHYSICAL EXAM:   General: NAD, frail appearing, thin, debilitated female Cardiovascular: regular rate and rhythm Pulmonary: clear ant fields Neurological: shuffling gait, walks with walker  Christin Ihor Gully, NP

## 2020-08-23 ENCOUNTER — Other Ambulatory Visit: Payer: Self-pay

## 2020-08-23 ENCOUNTER — Emergency Department: Payer: Medicare Other

## 2020-08-23 ENCOUNTER — Emergency Department
Admission: EM | Admit: 2020-08-23 | Discharge: 2020-08-23 | Disposition: A | Discharge status: Home / Self Care | Payer: Medicare Other | Attending: Emergency Medicine | Admitting: Emergency Medicine

## 2020-08-23 DIAGNOSIS — S0083XA Contusion of other part of head, initial encounter: Secondary | ICD-10-CM

## 2020-08-23 DIAGNOSIS — S0181XA Laceration without foreign body of other part of head, initial encounter: Secondary | ICD-10-CM | POA: Diagnosis not present

## 2020-08-23 NOTE — ED Provider Notes (Signed)
[facility] Emergency Department Provider Note   ____________________________________________   Event Date/Time   First MD Initiated Contact with Patient 08/23/20 [phone]     (approximate)  I have reviewed the triage vital signs and the nursing notes.   HISTORY  Chief Complaint Fall    HPI Sarah Donaldson is a 70 y.o. female patient presents for contusion, hematoma, and laceration to the left supraorbital area.  Husband state patient fell when she attempted to ambulate without the use of her walker.  When questioned about the patient's drowsiness he states this is normal when she take her nighttime medications.  Husband states there was no loss of consciousness.  I was unable to obtain a pain scale.  Bleeding controlled with direct pressure.      Past Medical History:  Diagnosis Date  . Cancer ([place])    Non-hodgkin's lymphoma (in remission)  . GERD (gastroesophageal reflux disease)    well-controlled w/ omeprazole  . Hyperlipidemia   . Knee joint cyst, left 02/07/2018  . Parkinson disease ([place])   . Parkinson disease [facility])     Patient Active Problem List   Diagnosis Date Noted  . Essential hypertension 06/02/2020  . Need for influenza vaccination 06/02/2020  . Encounter for annual health check of caregiver 04/08/2020  . Altered mental status   . Dementia due to Parkinson's disease with behavioral disturbance ([place])   . SDH (subdural hematoma) ([place]) 12/11/2019  . Falls frequently 12/11/2019  . S/P total knee replacement, left 06/07/2019  . Osteoarthritis of knee 04/27/2019  . Panic attacks 04/07/2019  . Psychosis ([place]) 04/04/2019  . Numbness 03/09/2018  . Lumbar spondylosis 03/07/2018  . Chronic pain of left knee 03/07/2018  . Chronic upper back pain 12/08/2017  . Benign neoplasm of hand 10/20/2017  . Chronic pain syndrome 10/20/2017  . Chronic pain of right lower extremity 10/20/2017  . Chronic myofascial pain 10/20/2017  . Numbness and  tingling 07/12/2017  . Sleep difficulties 07/12/2017  . Low back pain 01/18/2017  . Sleep behavior disorder, REM 01/18/2017  . Lumbar radiculopathy 11/01/2016  . Follicular lymphoma of extranodal and solid organ sites [facility]) 05/01/2016  . Lymphoma ([place]) 04/30/2016  . Hallux limitus 07/27/2015  . Low back pain with sciatica 02/13/2015  . Bilateral low back pain with sciatica 02/13/2015  . H/O adenomatous polyp of colon 04/06/2014  . Hx of adenomatous colonic polyps 04/06/2014  . Back ache 01/25/2014  . Adiposity 01/25/2014  . REM sleep behavior disorder 01/25/2014  . Disordered sleep 01/25/2014  . Has a tremor 01/25/2014  . Obesity, unspecified 01/25/2014  . Sleep disorder 01/25/2014  . Backache 01/25/2014  . Tremor 01/25/2014  . Obesity 01/25/2014  . Difficulty hearing 06/08/2012  . Hearing loss 06/08/2012  . Anxiety disorder 06/06/2012  . Colon polyp 06/06/2012  . Clinical depression 06/06/2012  . Hypercholesteremia 06/06/2012  . Idiopathic Parkinson's disease ([place]) 06/06/2012  . Detached retina 06/06/2012  . Depressive disorder 06/06/2012  . Retinal detachment 06/06/2012  . Parkinson's disease ([place]) 06/06/2012    Past Surgical History:  Procedure Laterality Date  . BACK SURGERY    . BUNIONECTOMY    . NASAL SEPTUM SURGERY    . RETINAL DETACHMENT SURGERY    . TOTAL KNEE ARTHROPLASTY Left 06/07/2019   Procedure: TOTAL KNEE ARTHROPLASTY;  Surgeon: Lovell Sheehan, MD;  Location: [facility] ORS;  Service: Orthopedics;  Laterality: Left;    Prior to Admission medications   Medication Sig Start Date End Date Taking?  Authorizing Provider  acetaminophen (TYLENOL) 500 MG tablet Take 500 mg by mouth 2 (two) times daily.     [provider]  ALPRAZolam Duanne Moron) 0.25 MG tablet Take 0.25 mg by mouth 2 (two) times daily as needed. 04/01/20   [provider]  Carbidopa-Levodopa ER (SINEMET CR) 25-100 MG tablet controlled release TAKE 2 EVERY 2 HOURS TAKE AN EXTRA 1-2  TABLETS AT NIGHT FOR CHEST TIGHTNESS    [provider]  clonazePAM (KLONOPIN) 2 MG tablet Take 1 mg by mouth 4 (four) times daily. 04/03/20   [provider]  clotrimazole-betamethasone (LOTRISONE) cream Apply 1 application topically 2 (two) times daily. 07/16/20   Edrick Kins, DPM  entacapone (COMTAN) 200 MG tablet Take 1 tablet (200 mg total) by mouth 4 (four) times daily. 03/30/20   Patrecia Pour, NP  escitalopram (LEXAPRO) 10 MG tablet Take 10 mg by mouth daily.    [provider]  HYDROcodone-acetaminophen (NORCO/VICODIN) 5-325 MG tablet Take by mouth.  04/02/20   [provider]  melatonin 5 MG TABS Take 1 tablet (5 mg total) by mouth at bedtime. 12/13/19   Ezekiel Slocumb, DO  neomycin-polymyxin-dexamethasone (MAXITROL) 0.1 % ophthalmic suspension Place 1 drop into the left eye daily.     [provider]  pantoprazole (PROTONIX) 20 MG tablet TAKE 1 TABLET BY MOUTH EVERY DAY 05/28/20   Patrecia Pour, NP  potassium chloride (KLOR-CON) 10 MEQ tablet TAKE 1 TABLET BY MOUTH EVERY DAY 05/06/20   Cletis Athens, MD  prednisoLONE acetate (PRED FORTE) 1 % ophthalmic suspension Place 1 drop into the left eye 4 (four) times daily.     [provider]  pregabalin (LYRICA) 100 MG capsule Take 100 mg by mouth 3 (three) times daily. 11/18/19   [provider]  QUEtiapine (SEROQUEL) 100 MG tablet Take 1 tablet (100 mg total) by mouth at bedtime. 08/08/20   Clapacs, Madie Reno, MD  QUEtiapine (SEROQUEL) 50 MG tablet Take 1 tablet (50 mg total) by mouth 3 (three) times daily. 08/08/20   Clapacs, Madie Reno, MD  traZODone (DESYREL) 50 MG tablet Take 25-50 mg by mouth at bedtime.  10/04/18   [provider]  trifluoperazine (STELAZINE) 1 MG tablet Take 1 tablet (1 mg total) by mouth 2 (two) times daily. 03/30/20   Patrecia Pour, NP    Allergies Percocet [oxycodone-acetaminophen], Baclofen, Codeine sulfate, Gabapentin, Terazosin, and Tramadol  hcl  Family History  Problem Relation Age of Onset  . Heart disease Mother   . Alcohol abuse Father   . Breast cancer Neg Hx   . Mental illness Neg Hx     Social History Social History   Tobacco Use  . Smoking status: Former Research scientist (life sciences)  . Smokeless tobacco: Never Used  Vaping Use  . Vaping Use: Never used  Substance Use Topics  . Alcohol use: Yes    Alcohol/week: 0.0 standard drinks  . Drug use: No    Review of Systems  Constitutional: No fever/chills Eyes: No visual changes. ENT: No sore throat. Cardiovascular: Denies chest pain. Respiratory: Denies shortness of breath. Gastrointestinal: No abdominal pain.  No nausea, no vomiting.  No diarrhea.  No constipation. Genitourinary: Negative for dysuria. Musculoskeletal: Negative for back pain. Skin: Negative for rash. Neurological: Negative for headaches, focal weakness or numbness.  Parkinson. Psychiatric:  Anxiety and depression. Endocrine:  Hypertension.  Hematological/Lymphatic:  Allergic/Immunilogical: Percocet, Codeine, baclofen, gabapentin, and tramadol. ____________________________________________   PHYSICAL EXAM:  VITAL SIGNS: ED Triage Vitals [  08/23/20 0002]  Enc Vitals Group     BP 104/62     Pulse Rate 84     Resp 20     Temp 98 F (36.7 C)     Temp Source Oral     SpO2 93 %     Weight 145 lb (65.8 kg)     Height 5\' 7"  (1.702 m)     Head Circumference      Peak Flow      Pain Score 0     Pain Loc      Pain Edu?      Excl. in [place]?     Constitutional: Drowsiness.  No acute distress.  Eyes: Conjunctivae are normal. PERRL. EOMI. Head: Atraumatic. Nose: No congestion/rhinnorhea. Mouth/Throat: Mucous membranes are moist.  Oropharynx non-erythematous. Neck: No stridor.  . Cardiovascular: Normal rate, regular rhythm. Grossly normal heart sounds.  Good peripheral circulation. Respiratory: Normal respiratory effort.  No retractions. Lungs CTAB. Gastrointestinal: Soft and nontender. No distention. No  abdominal bruits. No CVA tenderness. Genitourinary: Deferred Neurologic:  Normal speech and language. No gross focal neurologic deficits are appreciated. No gait instability. Skin: Laceration and hematoma to the left supraorbital area.    ____________________________________________   LABS (all labs ordered are listed, but only abnormal results are displayed)  Labs Reviewed - No data to display ____________________________________________  EKG   ____________________________________________  RADIOLOGY I, Sable Feil, personally viewed and evaluated these images (plain radiographs) as part of my medical decision making, as well as reviewing the written report by the radiologist.  ED MD interpretation:    Official radiology report(s): CT Head Wo Contrast  Result Date: 08/23/2020 CLINICAL DATA:  Fall EXAM: CT HEAD WITHOUT CONTRAST TECHNIQUE: Contiguous axial images were obtained from the base of the skull through the vertex without intravenous contrast. COMPARISON:  08/18/2020 FINDINGS: Brain: No acute intracranial abnormality. Specifically, no hemorrhage, hydrocephalus, mass lesion, acute infarction, or significant intracranial injury. Vascular: No hyperdense vessel or unexpected calcification. Skull: No acute calvarial abnormality. Sinuses/Orbits: No acute findings Other: None IMPRESSION: No acute intracranial abnormality. Electronically Signed   By: Rolm Baptise M.D.   On: 08/23/2020 00:44   CT Cervical Spine Wo Contrast  Result Date: 08/23/2020 CLINICAL DATA:  Fall, neck trauma EXAM: CT CERVICAL SPINE WITHOUT CONTRAST TECHNIQUE: Multidetector CT imaging of the cervical spine was performed without intravenous contrast. Multiplanar CT image reconstructions were also generated. COMPARISON:  08/18/2020 FINDINGS: Alignment: Normal Skull base and vertebrae: No acute fracture. No primary bone lesion or focal pathologic process. Soft tissues and spinal canal: No prevertebral fluid or  swelling. No visible canal hematoma. Disc levels: Disc space narrowing and spurring at C5-6 and C6-7. Bilateral degenerative facet disease diffusely. Multilevel bilateral neural foraminal narrowing. Upper chest: No acute findings Other: None IMPRESSION: Advanced degenerative disc and facet disease. No acute bony abnormality. Electronically Signed   By: Rolm Baptise M.D.   On: 08/23/2020 00:45    ____________________________________________   PROCEDURES  Procedure(s) performed (including Critical Care):  Marland KitchenMarland KitchenLaceration Repair  Date/Time: 08/23/2020 8:16 AM Performed by: Sable Feil, PA-C Authorized by: Sable Feil, PA-C   Consent:    Consent obtained:  Verbal   Consent given by:  Patient   Risks, benefits, and alternatives were discussed: yes     Risks discussed:  Infection, pain and poor cosmetic result Universal protocol:    Procedure explained and questions answered to patient or proxy's satisfaction: yes     Relevant documents present and verified:  yes     Test results available: no     Imaging studies available: yes     Required blood products, implants, devices, and special equipment available: no     Site/side marked: no     Immediately prior to procedure, a time out was called: yes     Patient identity confirmed:  Verbally with patient and arm band Anesthesia:    Anesthesia method:  None Laceration details:    Location:  Face   Face location:  L eyebrow   Length (cm):  2.5   Depth (mm):  2 Pre-procedure details:    Preparation:  Patient was prepped and draped in usual sterile fashion Exploration:    Hemostasis achieved with:  Direct pressure   Contaminated: no   Treatment:    Area cleansed with:  Povidone-iodine and saline   Amount of cleaning:  Standard   Debridement:  None   Undermining:  None   Scar revision: no   Skin repair:    Repair method:  Tissue adhesive Approximation:    Approximation:  Close Repair type:    Repair type:   Simple Post-procedure details:    Dressing:  Open (no dressing)   Procedure completion:  Tolerated well, no immediate complications     ____________________________________________   INITIAL IMPRESSION / ASSESSMENT AND PLAN / ED COURSE  As part of my medical decision making, I reviewed the following data within the electronic MEDICAL RECORD NUMBER         Patient presents with a laceration hematoma to left supraorbital area.  Discussed no acute findings on CT of the head and neck with husband.  See procedure note for wound closure.  Given discharge care instruction advised Tylenol ibuprofen as needed for headache/pain.  Follow-up with PCP.  Return back to ED if his wound reopens for healing is complete.      ____________________________________________   FINAL CLINICAL IMPRESSION(S) / ED DIAGNOSES  Final diagnoses:  Contusion of face, initial encounter  Facial laceration, initial encounter     ED Discharge Orders    None      *Please note:  Sarah Donaldson was evaluated in Emergency Department on 08/23/2020 for the symptoms described in the history of present illness. She was evaluated in the context of the global COVID-19 pandemic, which necessitated consideration that the patient might be at risk for infection with the SARS-CoV-2 virus that causes COVID-19. Institutional protocols and algorithms that pertain to the evaluation of patients at risk for COVID-19 are in a state of rapid change based on information released by regulatory bodies including the CDC and federal and state organizations. These policies and algorithms were followed during the patient's care in the ED.  Some ED evaluations and interventions may be delayed as a result of limited staffing during and the pandemic.*   Note:  This document was prepared using Dragon voice recognition software and may include unintentional dictation errors.    Sable Feil, PA-C 08/23/20 [id number]    Lavonia Drafts,  MD 08/23/20 0900

## 2020-08-23 NOTE — ED Notes (Signed)
Pt signed physical discharge form and was wheeled to lobby by husband.

## 2020-08-23 NOTE — Discharge Instructions (Signed)
No acute findings on CT of the head and neck.  Follow discharge care instructions.  No creams or ointment to Dermabond area.  Advised over-the-counter Tylenol ibuprofen as needed for pain.  Return back to ED if wound reopens before healing is complete

## 2020-08-23 NOTE — ED Triage Notes (Signed)
Pt fell today states she got up without her walker. Lac noted to left eyebrow. Pt very sleepy and slurring words. Husband states this is her normal mentation after taking her nighttime meds. No loc or other concerns. Denies any neck or back.

## 2020-08-31 ENCOUNTER — Other Ambulatory Visit: Payer: Self-pay | Admitting: Psychiatry

## 2020-09-09 ENCOUNTER — Other Ambulatory Visit: Payer: Self-pay | Admitting: *Deleted

## 2020-09-09 DIAGNOSIS — G20 Parkinson's disease: Secondary | ICD-10-CM

## 2020-09-10 ENCOUNTER — Inpatient Hospital Stay: Payer: Medicare Other | Attending: Internal Medicine | Admitting: Internal Medicine

## 2020-09-10 ENCOUNTER — Inpatient Hospital Stay: Payer: Medicare Other

## 2020-09-10 ENCOUNTER — Encounter: Payer: Self-pay | Admitting: Internal Medicine

## 2020-09-10 DIAGNOSIS — E785 Hyperlipidemia, unspecified: Secondary | ICD-10-CM | POA: Insufficient documentation

## 2020-09-10 DIAGNOSIS — Z8572 Personal history of non-Hodgkin lymphomas: Secondary | ICD-10-CM | POA: Diagnosis present

## 2020-09-10 DIAGNOSIS — K219 Gastro-esophageal reflux disease without esophagitis: Secondary | ICD-10-CM | POA: Diagnosis not present

## 2020-09-10 DIAGNOSIS — Z7952 Long term (current) use of systemic steroids: Secondary | ICD-10-CM | POA: Insufficient documentation

## 2020-09-10 DIAGNOSIS — G20 Parkinson's disease: Secondary | ICD-10-CM | POA: Insufficient documentation

## 2020-09-10 DIAGNOSIS — C8299 Follicular lymphoma, unspecified, extranodal and solid organ sites: Secondary | ICD-10-CM

## 2020-09-10 DIAGNOSIS — Z87891 Personal history of nicotine dependence: Secondary | ICD-10-CM | POA: Diagnosis not present

## 2020-09-10 DIAGNOSIS — Z79899 Other long term (current) drug therapy: Secondary | ICD-10-CM | POA: Insufficient documentation

## 2020-09-10 DIAGNOSIS — Z8249 Family history of ischemic heart disease and other diseases of the circulatory system: Secondary | ICD-10-CM | POA: Insufficient documentation

## 2020-09-10 LAB — COMPREHENSIVE METABOLIC PANEL
ALT: <5 U/L (ref 0–44)
AST: 15 U/L (ref 15–41)
Albumin: 4 g/dL (ref 3.5–5.0)
Alkaline Phosphatase: 97 U/L (ref 38–126)
Anion gap: 11 (ref 5–15)
BUN: 21 mg/dL (ref 8–23)
CO2: 28 mmol/L (ref 22–32)
Calcium: 9 mg/dL (ref 8.9–10.3)
Chloride: 106 mmol/L (ref 98–111)
Creatinine, Ser: 0.51 mg/dL (ref 0.44–1.00)
GFR, Estimated: >60 mL/min (ref >60)
Glucose, Bld: 93 mg/dL (ref 70–99)
Potassium: 3.4 mmol/L — ABNORMAL LOW (ref 3.5–5.1)
Sodium: 145 mmol/L (ref 135–145)
Total Bilirubin: 0.8 mg/dL (ref 0.3–1.2)
Total Protein: 6.4 g/dL — ABNORMAL LOW (ref 6.5–8.1)

## 2020-09-10 LAB — CBC WITH DIFFERENTIAL/PLATELET
Abs Immature Granulocytes: 0.02 10*3/uL (ref 0.00–0.07)
Basophils Absolute: 0 10*3/uL (ref 0.0–0.1)
Basophils Relative: 1 %
Eosinophils Absolute: 0.1 10*3/uL (ref 0.0–0.5)
Eosinophils Relative: 3 %
HCT: 40.6 % (ref 36.0–46.0)
Hemoglobin: 12.9 g/dL (ref 12.0–15.0)
Immature Granulocytes: 1 %
Lymphocytes Relative: 27 %
Lymphs Abs: 1.2 10*3/uL (ref 0.7–4.0)
MCH: 28.2 pg (ref 26.0–34.0)
MCHC: 31.8 g/dL (ref 30.0–36.0)
MCV: 88.8 fL (ref 80.0–100.0)
Monocytes Absolute: 0.4 10*3/uL (ref 0.1–1.0)
Monocytes Relative: 10 %
Neutro Abs: 2.6 10*3/uL (ref 1.7–7.7)
Neutrophils Relative %: 58 %
Platelets: 154 10*3/uL (ref 150–400)
RBC: 4.57 MIL/uL (ref 3.87–5.11)
RDW: 14.9 % (ref 11.5–15.5)
WBC: 4.3 10*3/uL (ref 4.0–10.5)
nRBC: 0 % (ref 0.0–0.2)

## 2020-09-10 LAB — LACTATE DEHYDROGENASE: LDH: 180 U/L (ref 98–192)

## 2020-09-10 NOTE — Assessment & Plan Note (Addendum)
#  Stage IV low-grade follicular lymphoma; involving the spleen. Rituxan maintenance last December 2015.  No clinical evidence of recurrence; continue surveillance.  # Parkinsons/Pscyhiatry-defer to PCP neurology/psychiatry regarding prognosis/palliative care consult etc.   #I discussed with the patient's husband regarding the prognosis of low-grade lymphoma overall is good; especially if she has not had any treatment since 2015.  However patient's other medical issues seem to be dictating her prognosis, which I would defer to PCP/neurology/psychiatry.   I spoke at length with the patient's family/HUsband- regarding the patient's clinical status/plan of care.  Family agreement.   #  DISPOSITION: john- cmp # follow up in 12  months-MD/labs-CBC/CMP/LDH;Dr.B

## 2020-09-10 NOTE — Progress Notes (Signed)
Patient appears very restless today.

## 2020-09-10 NOTE — Progress Notes (Signed)
[facility] OFFICE PROGRESS NOTE  Patient Care Team: Cletis Athens, MD as PCP - General (Cardiology)  Cancer Staging No matching staging information was found for the patient.   Oncology History Overview Note  1. Superficial Protidectomy (right side) December 17/2013.  Consistent with low-grade CD10 and [id number] to follicular lymphoma grade 1-2.  Is positive for CD19 and cough of light chain.bone marrow aspiration and biopsy is negative .  PET scan was positive for some increased uptake in the spleen; Stage IVA disease PET scan is consistent with bilateral neck lymph node (December, 2013) 2. Weekly rituxan has been started.  August 24, 2012 3. Patient finished the last dose of Rituxan on September 14 2012  #  maintenance rituximab since April of 2014 patient has finished rituximabin December of 2015  # PET MARCH 2017 -Splenic recurrence; CT images stable. 4th October 2017- PET - NED.   # chronic back pain/PN/ Parkinsons [Dr.Potter] -----------------------------------------------   DIAGNOSIS: FOLLICULAR LYMPHOMA  STAGE:  IV       ;GOALS: control  CURRENT/MOST RECENT THERAPY: surveillaince    Follicular lymphoma of extranodal and solid organ sites [facility])      INTERVAL HISTORY:  Sarah Donaldson 70 y.o.  female pleasant patient above history of Low-grade non-Hodgkin's lymphoma is here for follow-up.  In the interim patient has been diagnosed with progressive/worsening Parkinson's disease.  Also diagnosed with psychiatric illness.  Patient was admitted to hospital for neurologic issues and [place].  Patient has been evaluated by palliative care.  Patient is currently awaiting home hospice visit.  Patient is being considered for assisted living placement.  No new lumps or bumps.  No weight loss.  Review of Systems  Constitutional: Positive for malaise/fatigue. Negative for chills, diaphoresis, fever and weight loss.  HENT: Negative for nosebleeds and  sore throat.   Eyes: Negative for double vision.  Respiratory: Negative for cough, hemoptysis, sputum production, shortness of breath and wheezing.   Cardiovascular: Negative for chest pain, palpitations, orthopnea and leg swelling.  Gastrointestinal: Negative for abdominal pain, blood in stool, constipation, diarrhea, heartburn, melena, nausea and vomiting.  Genitourinary: Negative for dysuria, frequency and urgency.  Musculoskeletal: Positive for back pain and joint pain.  Skin: Negative.  Negative for itching and rash.  Neurological: Positive for dizziness and tremors (Chronic and attributed to Parkinson's.). Negative for tingling, focal weakness, weakness and headaches.  Endo/Heme/Allergies: Does not bruise/bleed easily.  Psychiatric/Behavioral: Negative for depression. The patient is not nervous/anxious and does not have insomnia.      PAST MEDICAL HISTORY :  Past Medical History:  Diagnosis Date  . Cancer ([place])    Non-hodgkin's lymphoma (in remission)  . GERD (gastroesophageal reflux disease)    well-controlled w/ omeprazole  . Hyperlipidemia   . Knee joint cyst, left 02/07/2018  . Parkinson disease ([place])   . Parkinson disease ([place])     PAST SURGICAL HISTORY :   Past Surgical History:  Procedure Laterality Date  . BACK SURGERY    . BUNIONECTOMY    . NASAL SEPTUM SURGERY    . RETINAL DETACHMENT SURGERY    . TOTAL KNEE ARTHROPLASTY Left 06/07/2019   Procedure: TOTAL KNEE ARTHROPLASTY;  Surgeon: Lovell Sheehan, MD;  Location: [facility] ORS;  Service: Orthopedics;  Laterality: Left;    FAMILY HISTORY :   Family History  Problem Relation Age of Onset  . Heart disease Mother   . Alcohol abuse Father   . Breast cancer Neg Hx   . Mental illness  Neg Hx     SOCIAL HISTORY:   Social History   Tobacco Use  . Smoking status: Former Research scientist (life sciences)  . Smokeless tobacco: Never Used  Vaping Use  . Vaping Use: Never used  Substance Use Topics  . Alcohol use: Yes    Alcohol/week: 0.0  standard drinks  . Drug use: No    ALLERGIES:  is allergic to percocet [oxycodone-acetaminophen], baclofen, codeine sulfate, gabapentin, terazosin, and tramadol hcl.  MEDICATIONS:  Current Outpatient Medications  Medication Sig Dispense Refill  . acetaminophen (TYLENOL) 500 MG tablet Take 500 mg by mouth 2 (two) times daily.     Marland Kitchen ALPRAZolam (XANAX) 0.25 MG tablet Take 0.25 mg by mouth 2 (two) times daily as needed.    . Carbidopa-Levodopa ER (SINEMET CR) 25-100 MG tablet controlled release TAKE 2 EVERY 2 HOURS TAKE AN EXTRA 1-2 TABLETS AT NIGHT FOR CHEST TIGHTNESS    . clonazePAM (KLONOPIN) 2 MG tablet Take 1 mg by mouth 4 (four) times daily.    . clotrimazole-betamethasone (LOTRISONE) cream Apply 1 application topically 2 (two) times daily. 45 g 3  . entacapone (COMTAN) 200 MG tablet Take 1 tablet (200 mg total) by mouth 4 (four) times daily. 120 tablet 0  . escitalopram (LEXAPRO) 10 MG tablet Take 10 mg by mouth daily.    Marland Kitchen HYDROcodone-acetaminophen (NORCO/VICODIN) 5-325 MG tablet Take by mouth.     . neomycin-polymyxin-dexamethasone (MAXITROL) 0.1 % ophthalmic suspension Place 1 drop into the left eye daily.     . potassium chloride (KLOR-CON) 10 MEQ tablet TAKE 1 TABLET BY MOUTH EVERY DAY 30 tablet 1  . prednisoLONE acetate (PRED FORTE) 1 % ophthalmic suspension Place 1 drop into the left eye 4 (four) times daily.     . pregabalin (LYRICA) 100 MG capsule Take 100 mg by mouth 3 (three) times daily.    . QUEtiapine (SEROQUEL) 100 MG tablet Take 1 tablet (100 mg total) by mouth at bedtime. 30 tablet 1  . QUEtiapine (SEROQUEL) 50 MG tablet Take 1 tablet (50 mg total) by mouth 3 (three) times daily. 90 tablet 1  . traZODone (DESYREL) 50 MG tablet Take 25-50 mg by mouth at bedtime.      No current facility-administered medications for this visit.    PHYSICAL EXAMINATION: ECOG PERFORMANCE STATUS: 0 - Asymptomatic  BP 113/77 (BP Location: Right Arm, Patient Position: Sitting, Cuff Size:  Normal)   Pulse 72   Temp 97.8 F (36.6 C) (Tympanic)   Ht 5\' 7"  (1.702 m)   Wt 148 lb (67.1 kg)   SpO2 99%   BMI 23.18 kg/m   Filed Weights   09/10/20 1420  Weight: 148 lb (67.1 kg)    Physical Exam Constitutional:      Comments: Patient is in a wheelchair.  Accompanied by husband.  Patient is frail-appearing.  Patient appears fidgety/tremors.   HENT:     Head: Normocephalic and atraumatic.     Mouth/Throat:     Pharynx: No oropharyngeal exudate.  Eyes:     Pupils: Pupils are equal, round, and reactive to light.  Cardiovascular:     Rate and Rhythm: Normal rate and regular rhythm.  Pulmonary:     Effort: Pulmonary effort is normal. No respiratory distress.     Breath sounds: Normal breath sounds. No wheezing.  Abdominal:     General: Bowel sounds are normal. There is no distension.     Palpations: Abdomen is soft. There is no mass.     Tenderness: There  is no abdominal tenderness. There is no guarding or rebound.  Musculoskeletal:        General: No tenderness. Normal range of motion.     Cervical back: Normal range of motion and neck supple.  Skin:    General: Skin is warm.  Neurological:     Mental Status: She is alert and oriented to person, place, and time.  Psychiatric:        Mood and Affect: Affect normal.     LABORATORY DATA:  I have reviewed the data as listed    Component Value Date/Time   NA 143 08/07/2020 1727   NA 139 12/03/2014 1038   K 3.8 08/07/2020 1727   K 3.6 12/03/2014 1038   CL 110 08/07/2020 1727   CL 106 12/03/2014 1038   CO2 24 08/07/2020 1727   CO2 28 12/03/2014 1038   GLUCOSE 99 08/07/2020 1727   GLUCOSE 94 12/03/2014 1038   BUN 26 (H) 08/07/2020 1727   BUN 21 (H) 12/03/2014 1038   CREATININE 0.57 08/07/2020 1727   CREATININE 0.65 12/03/2014 1038   CALCIUM 8.7 (L) 08/07/2020 1727   CALCIUM 9.1 12/03/2014 1038   PROT 6.2 (L) 08/07/2020 1727   PROT 6.9 12/03/2014 1038   ALBUMIN 4.0 08/07/2020 1727   ALBUMIN 4.2 12/03/2014  1038   AST 17 08/07/2020 1727   AST 19 12/03/2014 1038   ALT <5 08/07/2020 1727   ALT < 5 (L) 12/03/2014 1038   ALKPHOS 84 08/07/2020 1727   ALKPHOS 89 12/03/2014 1038   BILITOT 0.9 08/07/2020 1727   BILITOT 0.8 12/03/2014 1038   GFRNONAA >60 08/07/2020 1727   GFRNONAA >60 12/03/2014 1038   GFRAA >60 04/10/2020 2233   GFRAA >60 12/03/2014 1038    No results found for: SPEP, UPEP  Lab Results  Component Value Date   WBC 4.3 09/10/2020   NEUTROABS 2.6 09/10/2020   HGB 12.9 09/10/2020   HCT 40.6 09/10/2020   MCV 88.8 09/10/2020   PLT 154 09/10/2020      Chemistry      Component Value Date/Time   NA 143 08/07/2020 1727   NA 139 12/03/2014 1038   K 3.8 08/07/2020 1727   K 3.6 12/03/2014 1038   CL 110 08/07/2020 1727   CL 106 12/03/2014 1038   CO2 24 08/07/2020 1727   CO2 28 12/03/2014 1038   BUN 26 (H) 08/07/2020 1727   BUN 21 (H) 12/03/2014 1038   CREATININE 0.57 08/07/2020 1727   CREATININE 0.65 12/03/2014 1038      Component Value Date/Time   CALCIUM 8.7 (L) 08/07/2020 1727   CALCIUM 9.1 12/03/2014 1038   ALKPHOS 84 08/07/2020 1727   ALKPHOS 89 12/03/2014 1038   AST 17 08/07/2020 1727   AST 19 12/03/2014 1038   ALT <5 08/07/2020 1727   ALT < 5 (L) 12/03/2014 1038   BILITOT 0.9 08/07/2020 1727   BILITOT 0.8 12/03/2014 1038      RADIOGRAPHIC STUDIES: I have personally reviewed the radiological images as listed and agreed with the findings in the report. No results found.   ASSESSMENT & PLAN:  Follicular lymphoma of extranodal and solid organ sites [facility](HCC) #Stage IV low-grade follicular lymphoma; involving the spleen. Rituxan maintenance last December 2015.  No clinical evidence of recurrence; continue surveillance.  # Parkinsons/Pscyhiatry-defer to PCP neurology/psychiatry regarding prognosis/palliative care consult etc.   #I discussed with the patient's husband regarding the prognosis of low-grade lymphoma overall is good; especially if she has not  had  any treatment since 2015.  However patient's other medical issues seem to be dictating her prognosis, which I would defer to PCP/neurology/psychiatry.   I spoke at length with the patient's family/HUsband- regarding the patient's clinical status/plan of care.  Family agreement.   #  DISPOSITION: john- cmp # follow up in 12  months-MD/labs-CBC/CMP/LDH;Dr.B     No orders of the defined types were placed in this encounter.  All questions were answered. The patient knows to call the clinic with any problems, questions or concerns.      Cammie Sickle, MD 09/10/2020 3:05 PM

## 2020-09-13 ENCOUNTER — Telehealth: Payer: Self-pay | Admitting: Nurse Practitioner

## 2020-09-13 NOTE — Telephone Encounter (Signed)
I called Sarah Donaldson to schedule follow-up Palliative care visit as Hospice found not eligible for services at this time. Appointment schedule. Sarah. Donaldson and I talked about Sarah. Donaldson possibly being placed at [facility]. Sarah Donaldson endorses the behaviors continue to worsen. List sent to Sarah. Donaldson with available resources for Psychiatry and Psychotherapy. Questions answer. Contact information provided.  Total time spent 20 minutes  Documentation 5 minutes  Phone discussion 15 minutes

## 2020-09-14 ENCOUNTER — Emergency Department: Payer: Medicare Other

## 2020-09-14 ENCOUNTER — Other Ambulatory Visit (HOSPITAL_COMMUNITY): Payer: Self-pay | Admitting: Psychiatry

## 2020-09-14 ENCOUNTER — Emergency Department
Admission: EM | Admit: 2020-09-14 | Discharge: 2020-09-15 | Disposition: A | Discharge status: Home / Self Care | Payer: Medicare Other | Attending: Emergency Medicine | Admitting: Emergency Medicine

## 2020-09-14 DIAGNOSIS — Z87891 Personal history of nicotine dependence: Secondary | ICD-10-CM | POA: Diagnosis not present

## 2020-09-14 DIAGNOSIS — I10 Essential (primary) hypertension: Secondary | ICD-10-CM | POA: Insufficient documentation

## 2020-09-14 DIAGNOSIS — Z96652 Presence of left artificial knee joint: Secondary | ICD-10-CM | POA: Insufficient documentation

## 2020-09-14 DIAGNOSIS — Z20822 Contact with and (suspected) exposure to covid-19: Secondary | ICD-10-CM | POA: Insufficient documentation

## 2020-09-14 DIAGNOSIS — Z8572 Personal history of non-Hodgkin lymphomas: Secondary | ICD-10-CM | POA: Insufficient documentation

## 2020-09-14 DIAGNOSIS — E876 Hypokalemia: Secondary | ICD-10-CM | POA: Diagnosis not present

## 2020-09-14 DIAGNOSIS — R456 Violent behavior: Secondary | ICD-10-CM | POA: Diagnosis present

## 2020-09-14 DIAGNOSIS — G20 Parkinson's disease: Secondary | ICD-10-CM | POA: Insufficient documentation

## 2020-09-14 LAB — BASIC METABOLIC PANEL
Anion gap: 8 (ref 5–15)
BUN: 22 mg/dL (ref 8–23)
CO2: 27 mmol/L (ref 22–32)
Calcium: 8.7 mg/dL — ABNORMAL LOW (ref 8.9–10.3)
Chloride: 108 mmol/L (ref 98–111)
Creatinine, Ser: 0.38 mg/dL — ABNORMAL LOW (ref 0.44–1.00)
GFR, Estimated: >60 mL/min (ref >60)
Glucose, Bld: 119 mg/dL — ABNORMAL HIGH (ref 70–99)
Potassium: 2.8 mmol/L — ABNORMAL LOW (ref 3.5–5.1)
Sodium: 143 mmol/L (ref 135–145)

## 2020-09-14 LAB — CBC WITH DIFFERENTIAL/PLATELET
Abs Immature Granulocytes: 0 10*3/uL (ref 0.00–0.07)
Basophils Absolute: 0 10*3/uL (ref 0.0–0.1)
Basophils Relative: 1 %
Eosinophils Absolute: 0.1 10*3/uL (ref 0.0–0.5)
Eosinophils Relative: 3 %
HCT: 38.2 % (ref 36.0–46.0)
Hemoglobin: 11.9 g/dL — ABNORMAL LOW (ref 12.0–15.0)
Immature Granulocytes: 0 %
Lymphocytes Relative: 23 %
Lymphs Abs: 1 10*3/uL (ref 0.7–4.0)
MCH: 27.2 pg (ref 26.0–34.0)
MCHC: 31.2 g/dL (ref 30.0–36.0)
MCV: 87.4 fL (ref 80.0–100.0)
Monocytes Absolute: 0.4 10*3/uL (ref 0.1–1.0)
Monocytes Relative: 9 %
Neutro Abs: 2.8 10*3/uL (ref 1.7–7.7)
Neutrophils Relative %: 64 %
Platelets: 135 10*3/uL — ABNORMAL LOW (ref 150–400)
RBC: 4.37 MIL/uL (ref 3.87–5.11)
RDW: 14.5 % (ref 11.5–15.5)
WBC: 4.3 10*3/uL (ref 4.0–10.5)
nRBC: 0 % (ref 0.0–0.2)

## 2020-09-14 LAB — URINALYSIS, COMPLETE (UACMP) WITH MICROSCOPIC
Bacteria, UA: NONE SEEN
Bilirubin Urine: NEGATIVE
Glucose, UA: NEGATIVE mg/dL
Hgb urine dipstick: NEGATIVE
Ketones, ur: 5 mg/dL — AB
Leukocytes,Ua: NEGATIVE
Nitrite: NEGATIVE
Protein, ur: NEGATIVE mg/dL
Specific Gravity, Urine: 1.024 (ref 1.005–1.030)
Squamous Epithelial / HPF: NONE SEEN (ref 0–5)
pH: 6 (ref 5.0–8.0)

## 2020-09-14 LAB — MAGNESIUM: Magnesium: 2.1 mg/dL (ref 1.7–2.4)

## 2020-09-14 MED ORDER — NEOMYCIN-POLYMYXIN-DEXAMETH 3.5-10000-0.1 OP SUSP
1.0000 [drp] | Freq: Every day | OPHTHALMIC | Status: DC
  Administered 2020-09-15: 1 [drp] via OPHTHALMIC
  Filled 2020-09-14: qty 5

## 2020-09-14 MED ORDER — ESCITALOPRAM OXALATE 10 MG PO TABS
10.0000 mg | ORAL_TABLET | Freq: Every day | ORAL | Status: DC
  Administered 2020-09-15: 10 mg via ORAL
  Filled 2020-09-14: qty 1

## 2020-09-14 MED ORDER — QUETIAPINE FUMARATE 25 MG PO TABS
25.0000 mg | ORAL_TABLET | Freq: Three times a day (TID) | ORAL | Status: DC
  Administered 2020-09-15 (×2): 25 mg via ORAL
  Filled 2020-09-14 (×2): qty 1

## 2020-09-14 MED ORDER — PREGABALIN 50 MG PO CAPS
100.0000 mg | ORAL_CAPSULE | Freq: Three times a day (TID) | ORAL | Status: DC
  Administered 2020-09-15 (×2): 100 mg via ORAL
  Filled 2020-09-14 (×2): qty 2

## 2020-09-14 MED ORDER — PREDNISOLONE ACETATE 1 % OP SUSP
1.0000 [drp] | Freq: Every day | OPHTHALMIC | Status: DC
  Administered 2020-09-15: 1 [drp] via OPHTHALMIC
  Filled 2020-09-14: qty 5

## 2020-09-14 MED ORDER — CARBIDOPA-LEVODOPA 25-100 MG PO TABS
2.0000 | ORAL_TABLET | ORAL | Status: DC
  Administered 2020-09-15 (×4): 2 via ORAL
  Filled 2020-09-14 (×12): qty 2

## 2020-09-14 MED ORDER — ENTACAPONE 200 MG PO TABS
200.0000 mg | ORAL_TABLET | Freq: Every day | ORAL | Status: DC
  Administered 2020-09-15: 200 mg via ORAL
  Filled 2020-09-14 (×6): qty 1

## 2020-09-14 MED ORDER — HYDROCODONE-ACETAMINOPHEN 5-325 MG PO TABS
1.0000 | ORAL_TABLET | Freq: Two times a day (BID) | ORAL | Status: DC
  Administered 2020-09-15 (×2): 1 via ORAL
  Filled 2020-09-14 (×2): qty 1

## 2020-09-14 MED ORDER — CLONAZEPAM 0.5 MG PO TABS: 1.0000 mg | ORAL_TABLET | Freq: Four times a day (QID) | ORAL | Status: DC

## 2020-09-14 MED ORDER — POTASSIUM CHLORIDE 10 MEQ/100ML IV SOLN
10.0000 meq | INTRAVENOUS | Status: AC
  Administered 2020-09-14 (×5): 10 meq via INTRAVENOUS
  Filled 2020-09-14 (×2): qty 100

## 2020-09-14 MED ORDER — TRAZODONE HCL 50 MG PO TABS
50.0000 mg | ORAL_TABLET | Freq: Every day | ORAL | Status: DC
  Administered 2020-09-15: 50 mg via ORAL
  Filled 2020-09-14: qty 1

## 2020-09-14 NOTE — ED Notes (Addendum)
Patient still sleeping, arousable to verbal stimuli, family at bedside states patient has not been sleeping/eating for several days. Patient has been accepted at [facility] but per husband she needs TB test and verification prior to be admitted. He was waiting until Monday to follow up.

## 2020-09-14 NOTE — ED Provider Notes (Signed)
[facility] Emergency Department Provider Note   ____________________________________________   I have reviewed the triage vital signs and the nursing notes.   HISTORY  Chief Complaint Altered Mental Status   History limited by: Altered Mental Status   HPI Sarah Donaldson is a 70 y.o. female who presents to the emergency department today because of concern for combativeness at home. The patient has a history of parkinsons. Per report that patient did not take a number of her medications last night. This morning she became upset and aggressive when it was discussed about her going to a nursing facility. When EMS arrived she continued to be significantly combative. They did give her versed and haldol which helped calm her down. The patient is unable to give any history at this time.   Records reviewed. Per medical record review patient has a history of parkison disease.  Past Medical History:  Diagnosis Date  . Cancer ([place])    Non-hodgkin's lymphoma (in remission)  . GERD (gastroesophageal reflux disease)    well-controlled w/ omeprazole  . Hyperlipidemia   . Knee joint cyst, left 02/07/2018  . Parkinson disease ([place])   . Parkinson disease [facility])     Patient Active Problem List   Diagnosis Date Noted  . Essential hypertension 06/02/2020  . Need for influenza vaccination 06/02/2020  . Encounter for annual health check of caregiver 04/08/2020  . Altered mental status   . Dementia due to Parkinson's disease with behavioral disturbance ([place])   . SDH (subdural hematoma) ([place]) 12/11/2019  . Falls frequently 12/11/2019  . S/P total knee replacement, left 06/07/2019  . Osteoarthritis of knee 04/27/2019  . Panic attacks 04/07/2019  . Psychosis ([place]) 04/04/2019  . Numbness 03/09/2018  . Lumbar spondylosis 03/07/2018  . Chronic pain of left knee 03/07/2018  . Chronic upper back pain 12/08/2017  . Benign neoplasm of hand 10/20/2017  . Chronic pain syndrome  10/20/2017  . Chronic pain of right lower extremity 10/20/2017  . Chronic myofascial pain 10/20/2017  . Numbness and tingling 07/12/2017  . Sleep difficulties 07/12/2017  . Low back pain 01/18/2017  . Sleep behavior disorder, REM 01/18/2017  . Lumbar radiculopathy 11/01/2016  . Follicular lymphoma of extranodal and solid organ sites [facility]) 05/01/2016  . Lymphoma ([place]) 04/30/2016  . Hallux limitus 07/27/2015  . Low back pain with sciatica 02/13/2015  . Bilateral low back pain with sciatica 02/13/2015  . H/O adenomatous polyp of colon 04/06/2014  . Hx of adenomatous colonic polyps 04/06/2014  . Back ache 01/25/2014  . Adiposity 01/25/2014  . REM sleep behavior disorder 01/25/2014  . Disordered sleep 01/25/2014  . Has a tremor 01/25/2014  . Obesity, unspecified 01/25/2014  . Sleep disorder 01/25/2014  . Backache 01/25/2014  . Tremor 01/25/2014  . Obesity 01/25/2014  . Difficulty hearing 06/08/2012  . Hearing loss 06/08/2012  . Anxiety disorder 06/06/2012  . Colon polyp 06/06/2012  . Clinical depression 06/06/2012  . Hypercholesteremia 06/06/2012  . Idiopathic Parkinson's disease ([place]) 06/06/2012  . Detached retina 06/06/2012  . Depressive disorder 06/06/2012  . Retinal detachment 06/06/2012  . Parkinson's disease ([place]) 06/06/2012    Past Surgical History:  Procedure Laterality Date  . BACK SURGERY    . BUNIONECTOMY    . NASAL SEPTUM SURGERY    . RETINAL DETACHMENT SURGERY    . TOTAL KNEE ARTHROPLASTY Left 06/07/2019   Procedure: TOTAL KNEE ARTHROPLASTY;  Surgeon: Lovell Sheehan, MD;  Location: [facility] ORS;  Service: Orthopedics;  Laterality: Left;  Prior to Admission medications   Medication Sig Start Date End Date Taking? Authorizing Provider  acetaminophen (TYLENOL) 500 MG tablet Take 500 mg by mouth 2 (two) times daily.     [provider]  ALPRAZolam Duanne Moron) 0.25 MG tablet Take 0.25 mg by mouth 2 (two) times daily as needed. 04/01/20   [provider]  Carbidopa-Levodopa ER (SINEMET CR) 25-100 MG tablet controlled release TAKE 2 EVERY 2 HOURS TAKE AN EXTRA 1-2 TABLETS AT NIGHT FOR CHEST TIGHTNESS    [provider]  clonazePAM (KLONOPIN) 2 MG tablet Take 1 mg by mouth 4 (four) times daily. 04/03/20   [provider]  clotrimazole-betamethasone (LOTRISONE) cream Apply 1 application topically 2 (two) times daily. 07/16/20   Edrick Kins, DPM  entacapone (COMTAN) 200 MG tablet Take 1 tablet (200 mg total) by mouth 4 (four) times daily. 03/30/20   Patrecia Pour, NP  escitalopram (LEXAPRO) 10 MG tablet Take 10 mg by mouth daily.    [provider]  HYDROcodone-acetaminophen (NORCO/VICODIN) 5-325 MG tablet Take by mouth.  04/02/20   [provider]  neomycin-polymyxin-dexamethasone (MAXITROL) 0.1 % ophthalmic suspension Place 1 drop into the left eye daily.     [provider]  potassium chloride (KLOR-CON) 10 MEQ tablet TAKE 1 TABLET BY MOUTH EVERY DAY 05/06/20   Cletis Athens, MD  prednisoLONE acetate (PRED FORTE) 1 % ophthalmic suspension Place 1 drop into the left eye 4 (four) times daily.     [provider]  pregabalin (LYRICA) 100 MG capsule Take 100 mg by mouth 3 (three) times daily. 11/18/19   [provider]  QUEtiapine (SEROQUEL) 100 MG tablet Take 1 tablet (100 mg total) by mouth at bedtime. 08/08/20   Clapacs, Madie Reno, MD  QUEtiapine (SEROQUEL) 50 MG tablet Take 1 tablet (50 mg total) by mouth 3 (three) times daily. 08/08/20   Clapacs, Madie Reno, MD  traZODone (DESYREL) 50 MG tablet Take 25-50 mg by mouth at bedtime.  10/04/18   [provider]    Allergies Percocet [oxycodone-acetaminophen], Baclofen, Codeine sulfate, Gabapentin, Terazosin, and Tramadol hcl  Family History  Problem Relation Age of Onset  . Heart disease Mother   . Alcohol abuse Father   . Breast cancer Neg Hx   . Mental illness Neg Hx     Social History Social History   Tobacco Use  .  Smoking status: Former Research scientist (life sciences)  . Smokeless tobacco: Never Used  Vaping Use  . Vaping Use: Never used  Substance Use Topics  . Alcohol use: Yes    Alcohol/week: 0.0 standard drinks  . Drug use: No    Review of Systems Unable to obtain secondary to dementia, medication  ____________________________________________   PHYSICAL EXAM:  VITAL SIGNS: ED Triage Vitals  Enc Vitals Group     BP 09/14/20 1615 117/67     Pulse Rate 09/14/20 1615 70     Resp 09/14/20 1615 16     Temp 09/14/20 1615 97.7 F (36.5 C)     Temp Source 09/14/20 1615 Oral     SpO2 09/14/20 1615 94 %     Weight 09/14/20 1612 147 lb 11.3 oz (67 kg)     Height 09/14/20 1612 5\' 7"  (1.702 m)     Head Circumference --      Peak Flow --      Pain Score 09/14/20 1612 Asleep   Constitutional: Somnolent, awakens to verbal stimuli.  Eyes: Conjunctivae are normal.  ENT  Head: Normocephalic and atraumatic.      Nose: No congestion/rhinnorhea.      Mouth/Throat: Mucous membranes are moist.      Neck: No stridor. Hematological/Lymphatic/Immunilogical: No cervical lymphadenopathy. Cardiovascular: Normal rate, regular rhythm.  No murmurs, rubs, or gallops.  Respiratory: Normal respiratory effort without tachypnea nor retractions. Breath sounds are clear and equal bilaterally. No wheezes/rales/rhonchi. Gastrointestinal: Soft and non tender. No rebound. No guarding.  Genitourinary: Deferred Musculoskeletal: Normal range of motion in all extremities. No lower extremity edema. Neurologic:  Somnolent, awakens to verbal stimuli. Minimally verbal. Moving all extremities.  Skin:  Skin is warm, dry and intact. No rash noted.  ____________________________________________    LABS (pertinent positives/negatives)  BMP na 143, k 2.8, glu 119, cr 0.38 CBC wbc 4.3, hgb 11.9, plt 135 Magnesium 2.1 UA clear, 0-5 RBC and  WBC ____________________________________________   EKG  None  ____________________________________________    RADIOLOGY  CT head No acute abnormality  ____________________________________________   PROCEDURES  Procedures  ____________________________________________   INITIAL IMPRESSION / ASSESSMENT AND PLAN / ED COURSE  Pertinent labs & imaging results that were available during my care of the patient were reviewed by me and considered in my medical decision making (see chart for details).   Patient presents to the emergency department today because of concerns for aggressive behavior.  Patient does have a history of Parkinson's.  Patient was given medication by EMS to help calm her down.  On exam here she is calm.  Blood work and urine without any obvious cause of the patient's change in symptoms.  I did have a discussion with the husband.  Do think that some of her behavior today might be due to missing medications.  He also stated that she was not happy when further discussion was had about placement.  It does sound like the husband is working on getting patient placed in assisted living.  They have been talking to the [facility] and her physician has filled out an FL 2.  Will plan on having our social work team evaluate the patient morning and help facilitate placement in assisted living facility.  ____________________________________________   FINAL CLINICAL IMPRESSION(S) / ED DIAGNOSES  Final diagnoses:  Parkinson's disease ([place])  Hypokalemia     Note: This dictation was prepared with Dragon dictation. Any transcriptional errors that result from this process are unintentional     Nance Pear, MD 09/14/20 [phone]

## 2020-09-14 NOTE — ED Notes (Signed)
Upon entering room, patient noted to have not eaten. Patient assisted with eating sandwich and chips, patient drank 1 cup of water, patient now eating graham crackers independantly.

## 2020-09-14 NOTE — ED Notes (Signed)
Patient waking up, still sleepy, patient requested food and drink, provided to patient.
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
## 2020-09-14 NOTE — ED Notes (Signed)
Patient continues to sleep, NADN, vitals stable, juice at bedside, requested pharmacy to complete medication rec so patients home medications can be restarted.
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
## 2020-09-14 NOTE — ED Notes (Signed)
ED Provider at bedside. 

## 2020-09-15 DIAGNOSIS — G20 Parkinson's disease: Secondary | ICD-10-CM | POA: Diagnosis not present

## 2020-09-15 LAB — RESP PANEL BY RT-PCR (FLU A&B, COVID) ARPGX2
Influenza A by PCR: NEGATIVE
Influenza B by PCR: NEGATIVE
SARS Coronavirus 2 by RT PCR: NEGATIVE

## 2020-09-15 MED ORDER — CLONAZEPAM 0.5 MG PO TABS
1.0000 mg | ORAL_TABLET | Freq: Four times a day (QID) | ORAL | Status: DC
  Administered 2020-09-15 (×2): 1 mg via ORAL
  Filled 2020-09-15 (×2): qty 2

## 2020-09-15 MED ORDER — TUBERCULIN PPD 5 UNIT/0.1ML ID SOLN
5.0000 [IU] | INTRADERMAL | Status: DC
  Administered 2020-09-15: 5 [IU] via INTRADERMAL
  Filled 2020-09-15: qty 0.1

## 2020-09-15 NOTE — ED Notes (Signed)
Medication Reconciliation Report  For Home History Technicians  HIGHLIGHTS:  1. The patient WAS NOT personally interviewed 2. If not, what was the main source used: PHARMACY RECORDS 3. Does the patient appear to take any anti-coagulation agents (e.g. warfarin, Eliquis or Xarelto): NO 4. Does the patient appear to take any anti-convulsant agents (e.g. divalproex, levetiracetam or phenytoin): NO 5. Does the patient appear to use any insulin products (e.g. Lantus, Novolin or Humalog): NO 6. Does the patient appear to take any "beta-blockers" (e.g. metoprolol, carvedilol or bisoprolol: NO  BARRIERS:  1. Were there any barriers that prevented or complicated the medication reconciliation process: YES 2. If yes, what was the primary barrier encountered: Other 3. Does the patient appear compliant with prescribed medications: UNABLE TO DETERMINE 4. Does the patient express any barriers with compliance: UNABLE TO DETERMINE 5. What is the primary barrier the patient reports: None   NOTES:[Include any concerns, remarks or complaints the patient expresses regarding medication therapy. Any observations or other information that might be useful to the treatment team can also be included. Immediate needs or concerns should be referred to the RN or appropriate member of the treatment team.]  Pharmacy dispense records used for medications and recent chart notes from [facility] used to detail directions.   Colen Darling, CPhT [place] at [facility] [place]. [place], [place] [address] [phone]  ** The above is intended solely for informational and/or communicative purposes. It should in no way be considered an endorsement of any specific treatment, therapy or action. **

## 2020-09-15 NOTE — ED Provider Notes (Signed)
Procedures     ----------------------------------------- 12:58 PM on 09/15/2020 -----------------------------------------   Advised by social work the patient has been arranged for placement at the [facility], and family is to take her home today and they will take her to the [facility] when bed is ready.  She is medically stable for discharge, normal vital signs.   Carrie Mew, MD 09/15/20 1258

## 2020-09-15 NOTE — ED Notes (Signed)
Husband Sarah Donaldson read and understood discharge instructions. Verbalized understanding.

## 2020-09-15 NOTE — ED Provider Notes (Signed)
-----------------------------------------   6:14 AM on 09/15/2020 -----------------------------------------   Blood pressure 114/70, pulse 77, temperature 97.7 F (36.5 C), temperature source Oral, resp. rate 12, height 5\' 7"  (1.702 m), weight 67 kg, SpO2 94 %.  The patient is calm and cooperative at this time.  There have been no acute events since the last update.  Awaiting disposition plan from Social Work team.   Paulette Blanch, MD 09/15/20 [phone]

## 2020-09-15 NOTE — Discharge Instructions (Addendum)
Placement at [facility] has been arranged. Please call them tomorrow to inquire about your planned arrival time for move-in.
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
## 2020-09-17 ENCOUNTER — Other Ambulatory Visit: Payer: Self-pay

## 2020-09-17 ENCOUNTER — Ambulatory Visit (INDEPENDENT_AMBULATORY_CARE_PROVIDER_SITE_OTHER): Payer: Medicare Other | Admitting: Podiatry

## 2020-09-17 ENCOUNTER — Ambulatory Visit: Payer: Medicare Other

## 2020-09-17 DIAGNOSIS — B351 Tinea unguium: Secondary | ICD-10-CM | POA: Diagnosis not present

## 2020-09-17 DIAGNOSIS — L989 Disorder of the skin and subcutaneous tissue, unspecified: Secondary | ICD-10-CM

## 2020-09-17 DIAGNOSIS — M79674 Pain in right toe(s): Secondary | ICD-10-CM

## 2020-09-17 DIAGNOSIS — M79675 Pain in left toe(s): Secondary | ICD-10-CM

## 2020-09-17 NOTE — Progress Notes (Signed)
   SUBJECTIVE Patient presents to office today complaining of elongated, thickened nails that cause pain while ambulating in shoes.  She is unable to trim her own nails. Patient is here for further evaluation and treatment.  Patient presents today with her husband  Past Medical History:  Diagnosis Date  . Cancer ([place])    Non-hodgkin's lymphoma (in remission)  . GERD (gastroesophageal reflux disease)    well-controlled w/ omeprazole  . Hyperlipidemia   . Knee joint cyst, left 02/07/2018  . Parkinson disease ([place])   . Parkinson disease ([place])     OBJECTIVE General Patient is awake, alert, and oriented x 3 and in no acute distress. Derm Skin is dry and supple bilateral. Negative open lesions or macerations. Remaining integument unremarkable. Nails are tender, long, thickened and dystrophic with subungual debris, consistent with onychomycosis, 1-5 bilateral. No signs of infection noted.  There is also some hyperkeratotic preulcerative callus tissue noted to the bilateral feet Vasc  DP and PT pedal pulses palpable bilaterally. Temperature gradient within normal limits.  Neuro Epicritic and protective threshold sensation grossly intact bilaterally.  Musculoskeletal Exam No symptomatic pedal deformities noted bilateral. Muscular strength within normal limits.  ASSESSMENT 1. Onychodystrophic nails 1-5 bilateral with hyperkeratosis of nails.  2. Onychomycosis of nail due to dermatophyte bilateral 3. Pain in foot bilateral 4.  Preulcerative callus tissue bilateral feet  PLAN OF CARE 1. Patient evaluated today.  2. Instructed to maintain good pedal hygiene and foot care.  3. Mechanical debridement of nails 1-5 bilaterally performed using a nail nipper. Filed with dremel without incident.  4.  Excisional debridement of the hyperkeratotic preulcerative callus tissue was performed using a tissue nipper without incident or bleeding  5.  Return to clinic in 3 mos.    Edrick Kins, DPM [facility]  Dr. Edrick Kins, [address]                                        [place], [place] [address]                Office [phone]  Fax [phone]

## 2020-09-19 ENCOUNTER — Other Ambulatory Visit: Payer: Medicare Other | Admitting: Nurse Practitioner

## 2020-09-20 ENCOUNTER — Emergency Department: Payer: Medicare Other

## 2020-09-20 ENCOUNTER — Other Ambulatory Visit: Payer: Self-pay

## 2020-09-20 ENCOUNTER — Emergency Department
Admission: EM | Admit: 2020-09-20 | Discharge: 2020-09-20 | Disposition: A | Discharge status: Home / Self Care | Payer: Medicare Other | Attending: Student in an Organized Health Care Education/Training Program | Admitting: Student in an Organized Health Care Education/Training Program

## 2020-09-20 DIAGNOSIS — Z20822 Contact with and (suspected) exposure to covid-19: Secondary | ICD-10-CM | POA: Diagnosis not present

## 2020-09-20 DIAGNOSIS — E860 Dehydration: Secondary | ICD-10-CM | POA: Insufficient documentation

## 2020-09-20 DIAGNOSIS — G3183 Dementia with Lewy bodies: Secondary | ICD-10-CM | POA: Insufficient documentation

## 2020-09-20 DIAGNOSIS — I10 Essential (primary) hypertension: Secondary | ICD-10-CM | POA: Diagnosis not present

## 2020-09-20 DIAGNOSIS — R4182 Altered mental status, unspecified: Secondary | ICD-10-CM

## 2020-09-20 DIAGNOSIS — Z8572 Personal history of non-Hodgkin lymphomas: Secondary | ICD-10-CM | POA: Insufficient documentation

## 2020-09-20 DIAGNOSIS — Z96652 Presence of left artificial knee joint: Secondary | ICD-10-CM | POA: Insufficient documentation

## 2020-09-20 DIAGNOSIS — Z87891 Personal history of nicotine dependence: Secondary | ICD-10-CM | POA: Insufficient documentation

## 2020-09-20 DIAGNOSIS — R410 Disorientation, unspecified: Secondary | ICD-10-CM | POA: Diagnosis present

## 2020-09-20 DIAGNOSIS — R451 Restlessness and agitation: Secondary | ICD-10-CM | POA: Insufficient documentation

## 2020-09-20 LAB — URINE DRUG SCREEN, QUALITATIVE (ARMC ONLY)
Amphetamines, Ur Screen: NOT DETECTED
Barbiturates, Ur Screen: NOT DETECTED
Benzodiazepine, Ur Scrn: POSITIVE — AB
Cannabinoid 50 Ng, Ur ~~LOC~~: NOT DETECTED
Cocaine Metabolite,Ur ~~LOC~~: NOT DETECTED
MDMA (Ecstasy)Ur Screen: NOT DETECTED
Methadone Scn, Ur: NOT DETECTED
Opiate, Ur Screen: POSITIVE — AB
Phencyclidine (PCP) Ur S: NOT DETECTED
Tricyclic, Ur Screen: POSITIVE — AB

## 2020-09-20 LAB — COMPREHENSIVE METABOLIC PANEL
ALT: <5 U/L (ref 0–44)
AST: 18 U/L (ref 15–41)
Albumin: 3.7 g/dL (ref 3.5–5.0)
Alkaline Phosphatase: 96 U/L (ref 38–126)
Anion gap: 11 (ref 5–15)
BUN: 17 mg/dL (ref 8–23)
CO2: 26 mmol/L (ref 22–32)
Calcium: 9 mg/dL (ref 8.9–10.3)
Chloride: 103 mmol/L (ref 98–111)
Creatinine, Ser: 0.66 mg/dL (ref 0.44–1.00)
GFR, Estimated: >60 mL/min (ref >60)
Glucose, Bld: 127 mg/dL — ABNORMAL HIGH (ref 70–99)
Potassium: 3.6 mmol/L (ref 3.5–5.1)
Sodium: 140 mmol/L (ref 135–145)
Total Bilirubin: 0.5 mg/dL (ref 0.3–1.2)
Total Protein: 5.9 g/dL — ABNORMAL LOW (ref 6.5–8.1)

## 2020-09-20 LAB — CBC WITH DIFFERENTIAL/PLATELET
Abs Immature Granulocytes: 0.01 10*3/uL (ref 0.00–0.07)
Basophils Absolute: 0 10*3/uL (ref 0.0–0.1)
Basophils Relative: 0 %
Eosinophils Absolute: 0.1 10*3/uL (ref 0.0–0.5)
Eosinophils Relative: 2 %
HCT: 38.6 % (ref 36.0–46.0)
Hemoglobin: 12.1 g/dL (ref 12.0–15.0)
Immature Granulocytes: 0 %
Lymphocytes Relative: 16 %
Lymphs Abs: 0.8 10*3/uL (ref 0.7–4.0)
MCH: 27.4 pg (ref 26.0–34.0)
MCHC: 31.3 g/dL (ref 30.0–36.0)
MCV: 87.5 fL (ref 80.0–100.0)
Monocytes Absolute: 0.4 10*3/uL (ref 0.1–1.0)
Monocytes Relative: 7 %
Neutro Abs: 3.7 10*3/uL (ref 1.7–7.7)
Neutrophils Relative %: 75 %
Platelets: 133 10*3/uL — ABNORMAL LOW (ref 150–400)
RBC: 4.41 MIL/uL (ref 3.87–5.11)
RDW: 14.6 % (ref 11.5–15.5)
WBC: 5 10*3/uL (ref 4.0–10.5)
nRBC: 0 % (ref 0.0–0.2)

## 2020-09-20 LAB — URINALYSIS, COMPLETE (UACMP) WITH MICROSCOPIC
Bacteria, UA: NONE SEEN
Bilirubin Urine: NEGATIVE
Glucose, UA: NEGATIVE mg/dL
Hgb urine dipstick: NEGATIVE
Ketones, ur: 5 mg/dL — AB
Leukocytes,Ua: NEGATIVE
Nitrite: NEGATIVE
Protein, ur: NEGATIVE mg/dL
Specific Gravity, Urine: 1.017 (ref 1.005–1.030)
pH: 6 (ref 5.0–8.0)

## 2020-09-20 LAB — APTT: aPTT: 29 seconds (ref 24–36)

## 2020-09-20 LAB — BLOOD GAS, VENOUS
Acid-Base Excess: 2.3 mmol/L — ABNORMAL HIGH (ref 0.0–2.0)
Bicarbonate: 28.3 mmol/L — ABNORMAL HIGH (ref 20.0–28.0)
O2 Saturation: 94.4 %
Patient temperature: 37
pCO2, Ven: 49 mmHg (ref 44.0–60.0)
pH, Ven: 7.37 (ref 7.250–7.430)
pO2, Ven: 75 mmHg — ABNORMAL HIGH (ref 32.0–45.0)

## 2020-09-20 LAB — RESP PANEL BY RT-PCR (FLU A&B, COVID) ARPGX2
Influenza A by PCR: NEGATIVE
Influenza B by PCR: NEGATIVE
SARS Coronavirus 2 by RT PCR: NEGATIVE

## 2020-09-20 LAB — TROPONIN I (HIGH SENSITIVITY): Troponin I (High Sensitivity): 4 ng/L (ref <18)

## 2020-09-20 LAB — PROTIME-INR
INR: 1 (ref 0.8–1.2)
Prothrombin Time: 12.6 seconds (ref 11.4–15.2)

## 2020-09-20 LAB — LACTIC ACID, PLASMA
Lactic Acid, Venous: 2 mmol/L (ref 0.5–1.9)
Lactic Acid, Venous: 1.5 mmol/L (ref 0.5–1.9)

## 2020-09-20 MED ORDER — SODIUM CHLORIDE 0.9 % IV BOLUS
1000.0000 mL | Freq: Once | INTRAVENOUS | Status: AC
  Administered 2020-09-20: 1000 mL via INTRAVENOUS

## 2020-09-20 MED ORDER — SODIUM CHLORIDE 0.9 % IV BOLUS (SEPSIS)
1000.0000 mL | Freq: Once | INTRAVENOUS | Status: AC
  Administered 2020-09-20: 1000 mL via INTRAVENOUS

## 2020-09-20 MED ORDER — SODIUM CHLORIDE 0.9 % IV BOLUS: 1000.0000 mL | Freq: Once | INTRAVENOUS | Status: DC

## 2020-09-20 MED ORDER — CARBIDOPA-LEVODOPA 25-100 MG PO TABS
2.0000 | ORAL_TABLET | ORAL | Status: DC
  Administered 2020-09-20: 2 via ORAL
  Filled 2020-09-20 (×3): qty 2

## 2020-09-20 NOTE — ED Notes (Signed)
Pt placed on 2L [place] d/t SpO2 90% Dr Quentin Cornwall aware

## 2020-09-20 NOTE — ED Notes (Signed)
Dr Quentin Cornwall notified of lactic 2.0. ordered to be placed as needed

## 2020-09-20 NOTE — ED Notes (Signed)
Pt husband called Network engineer and informed that pt had a fall last night hitting head and causing bleeding. Dr Quentin Cornwall notified

## 2020-09-20 NOTE — ED Notes (Signed)
In and out performed with Caitlyn NT Ria Comment RN

## 2020-09-20 NOTE — ED Triage Notes (Signed)
Pt to ED from home via [facility] for chief complaint of AMS, combative.  5 mg haldol and 2mg  versed IM given EMS PTA Not answering questions appropriately at this time Per ems pt supposed to go to [facility] yesterday

## 2020-09-20 NOTE — ED Provider Notes (Signed)
[facility] Emergency Department Provider Note    Event Date/Time   First MD Initiated Contact with Patient 09/20/20 1207     (approximate)  I have reviewed the triage vital signs and the nursing notes.   HISTORY  Chief Complaint Altered Mental Status    HPI Sarah Donaldson is a 70 y.o. female below listed past medical history presents to the ER via EMS after husband called EMS because the patient was combative towards family agitated and confused. Last seen normal was yesterday. Reportedly supposed to be going to assisted living facility today there is some vague report of abnormal electrolytes. Patient denies any pain but she was quite combative with EMS requiring Haldol as well as Versed in route. She is slightly drowsy but currently protecting her airway. Found to be hypotensive therefore IV fluids initiated. She has no specific complaints.    Past Medical History:  Diagnosis Date  . Cancer ([place])    Non-hodgkin's lymphoma (in remission)  . GERD (gastroesophageal reflux disease)    well-controlled w/ omeprazole  . Hyperlipidemia   . Knee joint cyst, left 02/07/2018  . Parkinson disease ([place])   . Parkinson disease ([place])    Family History  Problem Relation Age of Onset  . Heart disease Mother   . Alcohol abuse Father   . Breast cancer Neg Hx   . Mental illness Neg Hx    Past Surgical History:  Procedure Laterality Date  . BACK SURGERY    . BUNIONECTOMY    . NASAL SEPTUM SURGERY    . RETINAL DETACHMENT SURGERY    . TOTAL KNEE ARTHROPLASTY Left 06/07/2019   Procedure: TOTAL KNEE ARTHROPLASTY;  Surgeon: Lovell Sheehan, MD;  Location: [facility] ORS;  Service: Orthopedics;  Laterality: Left;   Patient Active Problem List   Diagnosis Date Noted  . Essential hypertension 06/02/2020  . Need for influenza vaccination 06/02/2020  . Encounter for annual health check of caregiver 04/08/2020  . Altered mental status   . Dementia due to Parkinson's  disease with behavioral disturbance ([place])   . SDH (subdural hematoma) ([place]) 12/11/2019  . Falls frequently 12/11/2019  . S/P total knee replacement, left 06/07/2019  . Osteoarthritis of knee 04/27/2019  . Panic attacks 04/07/2019  . Psychosis ([place]) 04/04/2019  . Numbness 03/09/2018  . Lumbar spondylosis 03/07/2018  . Chronic pain of left knee 03/07/2018  . Chronic upper back pain 12/08/2017  . Benign neoplasm of hand 10/20/2017  . Chronic pain syndrome 10/20/2017  . Chronic pain of right lower extremity 10/20/2017  . Chronic myofascial pain 10/20/2017  . Numbness and tingling 07/12/2017  . Sleep difficulties 07/12/2017  . Low back pain 01/18/2017  . Sleep behavior disorder, REM 01/18/2017  . Lumbar radiculopathy 11/01/2016  . Follicular lymphoma of extranodal and solid organ sites [facility]) 05/01/2016  . Lymphoma ([place]) 04/30/2016  . Hallux limitus 07/27/2015  . Low back pain with sciatica 02/13/2015  . Bilateral low back pain with sciatica 02/13/2015  . H/O adenomatous polyp of colon 04/06/2014  . Hx of adenomatous colonic polyps 04/06/2014  . Back ache 01/25/2014  . Adiposity 01/25/2014  . REM sleep behavior disorder 01/25/2014  . Disordered sleep 01/25/2014  . Has a tremor 01/25/2014  . Obesity, unspecified 01/25/2014  . Sleep disorder 01/25/2014  . Backache 01/25/2014  . Tremor 01/25/2014  . Obesity 01/25/2014  . Difficulty hearing 06/08/2012  . Hearing loss 06/08/2012  . Anxiety disorder 06/06/2012  . Colon polyp 06/06/2012  .  Clinical depression 06/06/2012  . Hypercholesteremia 06/06/2012  . Idiopathic Parkinson's disease ([place]) 06/06/2012  . Detached retina 06/06/2012  . Depressive disorder 06/06/2012  . Retinal detachment 06/06/2012  . Parkinson's disease ([place]) 06/06/2012      Prior to Admission medications   Medication Sig Start Date End Date Taking? Authorizing Provider  acetaminophen (TYLENOL) 650 MG CR tablet Take 650-1,300 mg by mouth in the morning and at  bedtime.    [provider]  ALPRAZolam Duanne Moron) 0.25 MG tablet Take 0.25-0.5 mg by mouth daily as needed for anxiety.    [provider]  carbidopa-levodopa (SINEMET IR) 25-100 MG tablet Take 2 tablets by mouth every 2 (two) hours. (between the hours of 8AM and 8PM)    [provider]  clonazePAM (KLONOPIN) 2 MG tablet Take 1 mg by mouth 4 (four) times daily. 04/03/20   [provider]  clotrimazole-betamethasone (LOTRISONE) cream Apply 1 application topically 2 (two) times daily. Patient not taking: Reported on 09/14/2020 07/16/20   Edrick Kins, DPM  entacapone (COMTAN) 200 MG tablet Take 200 mg by mouth 6 (six) times daily. (May take 1 additional tablet (200mg ) by mouth at 8PM if needed)    [provider]  escitalopram (LEXAPRO) 10 MG tablet Take 10 mg by mouth daily.    [provider]  HYDROcodone-acetaminophen (NORCO/VICODIN) 5-325 MG tablet Take 1 tablet by mouth 3 (three) times daily as needed for moderate pain.    [provider]  pregabalin (LYRICA) 100 MG capsule Take 100 mg by mouth 3 (three) times daily. 11/18/19   [provider]  QUEtiapine (SEROQUEL) 100 MG tablet Take 1 tablet (100 mg total) by mouth at bedtime. 08/08/20   Clapacs, Madie Reno, MD  QUEtiapine (SEROQUEL) 50 MG tablet Take 1 tablet (50 mg total) by mouth 3 (three) times daily. 08/08/20   Clapacs, Madie Reno, MD  traZODone (DESYREL) 50 MG tablet Take 50 mg by mouth at bedtime.    [provider]    Allergies Percocet [oxycodone-acetaminophen], Baclofen, Codeine sulfate, Gabapentin, Terazosin, and Tramadol hcl    Social History Social History   Tobacco Use  . Smoking status: Former Research scientist (life sciences)  . Smokeless tobacco: Never Used  Vaping Use  . Vaping Use: Never used  Substance Use Topics  . Alcohol use: Yes    Alcohol/week: 0.0 standard drinks  . Drug use: No    Review of Systems Patient denies headaches, rhinorrhea, blurry vision, numbness,  shortness of breath, chest pain, edema, cough, abdominal pain, nausea, vomiting, diarrhea, dysuria, fevers, rashes or hallucinations unless otherwise stated above in HPI. ____________________________________________   PHYSICAL EXAM:  VITAL SIGNS: Vitals:   09/20/20 1525 09/20/20 1704  BP: (!) 153/63 106/66  Pulse: 73 86  Resp: 11   Temp: 98.3 F (36.8 C)   SpO2: 96% 93%    Constitutional: Drowsy but oriented to person place and time after receiving Haldol and Versed. Eyes: Conjunctivae are normal.  Head: Atraumatic. Nose: No congestion/rhinnorhea. Mouth/Throat: Mucous membranes are dry.   Neck: No stridor. Painless ROM.  Cardiovascular: Normal rate, regular rhythm. Grossly normal heart sounds.  Good peripheral circulation. Respiratory: Normal respiratory effort.  No retractions. Lungs CTAB. Gastrointestinal: Soft and nontender. No distention. No abdominal bruits. No CVA tenderness. Genitourinary: deferred Musculoskeletal: No lower extremity tenderness nor edema.  No joint effusions. Neurologic:  Normal speech and language. No gross focal neurologic deficits are appreciated. No facial droop Skin:  Skin is warm, dry and intact. No rash noted. Psychiatric:  Mood and affect are normal. Speech and behavior are normal.  ____________________________________________   LABS (all labs ordered are listed, but only abnormal results are displayed)  Results for orders placed or performed during the hospital encounter of 09/20/20 (from the past 24 hour(s))  Lactic acid, plasma     Status: Abnormal   Collection Time: 09/20/20 12:16 PM  Result Value Ref Range   Lactic Acid, Venous 2.0 (HH) 0.5 - 1.9 mmol/L  Comprehensive metabolic panel     Status: Abnormal   Collection Time: 09/20/20 12:16 PM  Result Value Ref Range   Sodium 140 135 - 145 mmol/L   Potassium 3.6 3.5 - 5.1 mmol/L   Chloride 103 98 - 111 mmol/L   CO2 26 22 - 32 mmol/L   Glucose, Bld 127 (H) 70 - 99 mg/dL   BUN 17 8 - 23  mg/dL   Creatinine, Ser 0.66 0.44 - 1.00 mg/dL   Calcium 9.0 8.9 - 10.3 mg/dL   Total Protein 5.9 (L) 6.5 - 8.1 g/dL   Albumin 3.7 3.5 - 5.0 g/dL   AST 18 15 - 41 U/L   ALT <5 0 - 44 U/L   Alkaline Phosphatase 96 38 - 126 U/L   Total Bilirubin 0.5 0.3 - 1.2 mg/dL   GFR, Estimated >60 >60 mL/min   Anion gap 11 5 - 15  CBC WITH DIFFERENTIAL     Status: Abnormal   Collection Time: 09/20/20 12:16 PM  Result Value Ref Range   WBC 5.0 4.0 - 10.5 K/uL   RBC 4.41 3.87 - 5.11 MIL/uL   Hemoglobin 12.1 12.0 - 15.0 g/dL   HCT 38.6 36.0 - 46.0 %   MCV 87.5 80.0 - 100.0 fL   MCH 27.4 26.0 - 34.0 pg   MCHC 31.3 30.0 - 36.0 g/dL   RDW 14.6 11.5 - 15.5 %   Platelets 133 (L) 150 - 400 K/uL   nRBC 0.0 0.0 - 0.2 %   Neutrophils Relative % 75 %   Neutro Abs 3.7 1.7 - 7.7 K/uL   Lymphocytes Relative 16 %   Lymphs Abs 0.8 0.7 - 4.0 K/uL   Monocytes Relative 7 %   Monocytes Absolute 0.4 0.1 - 1.0 K/uL   Eosinophils Relative 2 %   Eosinophils Absolute 0.1 0.0 - 0.5 K/uL   Basophils Relative 0 %   Basophils Absolute 0.0 0.0 - 0.1 K/uL   Immature Granulocytes 0 %   Abs Immature Granulocytes 0.01 0.00 - 0.07 K/uL  Protime-INR     Status: None   Collection Time: 09/20/20 12:16 PM  Result Value Ref Range   Prothrombin Time 12.6 11.4 - 15.2 seconds   INR 1.0 0.8 - 1.2  APTT     Status: None   Collection Time: 09/20/20 12:16 PM  Result Value Ref Range   aPTT 29 24 - 36 seconds  Urinalysis, Complete w Microscopic Urine, Catheterized     Status: Abnormal   Collection Time: 09/20/20 12:16 PM  Result Value Ref Range   Color, Urine AMBER (A) YELLOW   APPearance CLEAR (A) CLEAR   Specific Gravity, Urine 1.017 1.005 - 1.030   pH 6.0 5.0 - 8.0   Glucose, UA NEGATIVE NEGATIVE mg/dL   Hgb urine dipstick NEGATIVE NEGATIVE   Bilirubin Urine NEGATIVE NEGATIVE   Ketones, ur 5 (A) NEGATIVE mg/dL   Protein, ur NEGATIVE NEGATIVE mg/dL   Nitrite NEGATIVE NEGATIVE   Leukocytes,Ua NEGATIVE NEGATIVE   RBC /  HPF 0-5 0 -  5 RBC/hpf   WBC, UA 0-5 0 - 5 WBC/hpf   Bacteria, UA NONE SEEN NONE SEEN   Squamous Epithelial / LPF 0-5 0 - 5   Mucus PRESENT   Urine Drug Screen, Qualitative ([facility] only)     Status: Abnormal   Collection Time: 09/20/20 12:16 PM  Result Value Ref Range   Tricyclic, Ur Screen POSITIVE (A) NONE DETECTED   Amphetamines, Ur Screen NONE DETECTED NONE DETECTED   MDMA (Ecstasy)Ur Screen NONE DETECTED NONE DETECTED   Cocaine Metabolite,Ur [place] NONE DETECTED NONE DETECTED   Opiate, Ur Screen POSITIVE (A) NONE DETECTED   Phencyclidine (PCP) Ur S NONE DETECTED NONE DETECTED   Cannabinoid 50 Ng, Ur [place] NONE DETECTED NONE DETECTED   Barbiturates, Ur Screen NONE DETECTED NONE DETECTED   Benzodiazepine, Ur Scrn POSITIVE (A) NONE DETECTED   Methadone Scn, Ur NONE DETECTED NONE DETECTED  Resp Panel by RT-PCR (Flu A&B, Covid) Nasopharyngeal Swab     Status: None   Collection Time: 09/20/20 12:16 PM   Specimen: Nasopharyngeal Swab; Nasopharyngeal(NP) swabs in vial transport medium  Result Value Ref Range   SARS Coronavirus 2 by RT PCR NEGATIVE NEGATIVE   Influenza A by PCR NEGATIVE NEGATIVE   Influenza B by PCR NEGATIVE NEGATIVE  Troponin I (High Sensitivity)     Status: None   Collection Time: 09/20/20 12:16 PM  Result Value Ref Range   Troponin I (High Sensitivity) 4 <18 ng/L  Blood gas, venous     Status: Abnormal   Collection Time: 09/20/20 12:20 PM  Result Value Ref Range   pH, Ven 7.37 7.250 - 7.430   pCO2, Ven 49 44.0 - 60.0 mmHg   pO2, Ven 75.0 (H) 32.0 - 45.0 mmHg   Bicarbonate 28.3 (H) 20.0 - 28.0 mmol/L   Acid-Base Excess 2.3 (H) 0.0 - 2.0 mmol/L   O2 Saturation 94.4 %   Patient temperature 37.0    Collection site VENOUS    Sample type VENOUS   Lactic acid, plasma     Status: None   Collection Time: 09/20/20  2:05 PM  Result Value Ref Range   Lactic Acid, Venous 1.5 0.5 - 1.9 mmol/L   ____________________________________________  EKG My review and personal  interpretation at Time: 12:11   Indication: weakness  Rate: 80  Rhythm: sinus Axis: normal Other: nonspecific t wave abn ____________________________________________  RADIOLOGY  I personally reviewed all radiographic images ordered to evaluate for the above acute complaints and reviewed radiology reports and findings.  These findings were personally discussed with the patient.  Please see medical record for radiology report.  ____________________________________________   PROCEDURES  Procedure(s) performed:  Procedures    Critical Care performed: no ____________________________________________   INITIAL IMPRESSION / ASSESSMENT AND PLAN / ED COURSE  Pertinent labs & imaging results that were available during my care of the patient were reviewed by me and considered in my medical decision making (see chart for details).   DDX: Dehydration, sepsis, pna, uti, hypoglycemia, cva, drug effect, withdrawal, encephalitis   Allyssa A Meinhart is a 70 y.o. who presents to the ED with altered mental status agitation combativeness with evidence of hypotension no fever. Does appear clinically very dehydrated. Currently maintaining her airway. Blood work sent for the above differential. Will give IV fluids for volume resuscitation. Imaging ordered for the but differential.  Clinical Course as of 09/20/20 1723  Fri Sep 20, 2020  1232 Blood pressure is improving after IV fluids.  Map greater than 65. [PR]  1429 Lactate normal.  I suspect patient does have a orthostasis either secondary to Parkinson's disease may be component of dehydration but is much better after IV fluids she is calm and cooperative.  Trending reach out to family. [PR]  1523 Patient calm and cooperative.  Discussed case with the patient's husband states that she did the same thing after getting agitated about discussion of her moving to assisted living facility.  At this point she appears hemodynamically stable well perfused no  acute distress with reassuring work-up.  I do not see indication for hospitalization. [PR]  1713 Patient reassessed.  Feeling much improved.  The husband brought her Sinemet now she has much better function.  Appears to be at her baseline.  At this point stable and appropriate for discharge home. [PR]    Clinical Course User Index [PR] Merlyn Lot, MD    The patient was evaluated in Emergency Department today for the symptoms described in the history of present illness. He/she was evaluated in the context of the global COVID-19 pandemic, which necessitated consideration that the patient might be at risk for infection with the SARS-CoV-2 virus that causes COVID-19. Institutional protocols and algorithms that pertain to the evaluation of patients at risk for COVID-19 are in a state of rapid change based on information released by regulatory bodies including the CDC and federal and state organizations. These policies and algorithms were followed during the patient's care in the ED.  As part of my medical decision making, I reviewed the following data within the [place] notes reviewed and incorporated, Labs reviewed, notes from prior ED visits and [place] Controlled Substance Database   ____________________________________________   FINAL CLINICAL IMPRESSION(S) / ED DIAGNOSES  Final diagnoses:  Altered mental status, unspecified altered mental status type  Dehydration      NEW MEDICATIONS STARTED DURING THIS VISIT:  New Prescriptions   No medications on file     Note:  This document was prepared using Dragon voice recognition software and may include unintentional dictation errors.    Merlyn Lot, MD 09/20/20 1723

## 2020-09-22 LAB — URINE CULTURE: Culture: >=100000 — AB

## 2020-09-23 NOTE — Progress Notes (Signed)
ED Antimicrobial Stewardship Positive Culture Follow Up   Sarah Donaldson is an 70 y.o. female who presented to [facility] on 09/20/2020 with a chief complaint of  Chief Complaint  Patient presents with  . Altered Mental Status    Recent Results (from the past 720 hour(s))  Resp Panel by RT-PCR (Flu A&B, Covid) Nasopharyngeal Swab     Status: None   Collection Time: 09/15/20 12:20 AM   Specimen: Nasopharyngeal Swab; Nasopharyngeal(NP) swabs in vial transport medium  Result Value Ref Range Status   SARS Coronavirus 2 by RT PCR NEGATIVE NEGATIVE Final    Comment: (NOTE) SARS-CoV-2 target nucleic acids are NOT DETECTED.  The SARS-CoV-2 RNA is generally detectable in upper respiratory specimens during the acute phase of infection. The lowest concentration of SARS-CoV-2 viral copies this assay can detect is 138 copies/mL. A negative result does not preclude SARS-Cov-2 infection and should not be used as the sole basis for treatment or other patient management decisions. A negative result may occur with  improper specimen collection/handling, submission of specimen other than nasopharyngeal swab, presence of viral mutation(s) within the areas targeted by this assay, and inadequate number of viral copies(<138 copies/mL). A negative result must be combined with clinical observations, patient history, and epidemiological information. The expected result is Negative.  Fact Sheet for Patients:  EntrepreneurPulse.com.au  Fact Sheet for Healthcare Providers:  IncredibleEmployment.be  This test is no t yet approved or cleared by the [place] FDA and  has been authorized for detection and/or diagnosis of SARS-CoV-2 by FDA under an Emergency Use Authorization (EUA). This EUA will remain  in effect (meaning this test can be used) for the duration of the COVID-19 declaration under Section 564(b)(1) of the Act, 21 U.S.C.section 360bbb-3(b)(1), unless the  authorization is terminated  or revoked sooner.       Influenza A by PCR NEGATIVE NEGATIVE Final   Influenza B by PCR NEGATIVE NEGATIVE Final    Comment: (NOTE) The Xpert Xpress SARS-CoV-2/FLU/RSV plus assay is intended as an aid in the diagnosis of influenza from Nasopharyngeal swab specimens and should not be used as a sole basis for treatment. Nasal washings and aspirates are unacceptable for Xpert Xpress SARS-CoV-2/FLU/RSV testing.  Fact Sheet for Patients: EntrepreneurPulse.com.au  Fact Sheet for Healthcare Providers: IncredibleEmployment.be  This test is not yet approved or cleared by the [place] FDA and has been authorized for detection and/or diagnosis of SARS-CoV-2 by FDA under an Emergency Use Authorization (EUA). This EUA will remain in effect (meaning this test can be used) for the duration of the COVID-19 declaration under Section 564(b)(1) of the Act, 21 U.S.C. section 360bbb-3(b)(1), unless the authorization is terminated or revoked.  Performed at [facility], [address] [place], [place] [address]   Blood culture (routine single)     Status: None (Preliminary result)   Collection Time: 09/20/20 12:16 PM   Specimen: BLOOD  Result Value Ref Range Status   Specimen Description BLOOD LEFT AC  Final   Special Requests   Final    BOTTLES DRAWN AEROBIC AND ANAEROBIC Blood Culture results may not be optimal due to an inadequate volume of blood received in culture bottles   Culture   Final    NO GROWTH 3 DAYS Performed at [facility], [address] [place], [place] [address]    Report Status PENDING  Incomplete  Urine culture     Status: Abnormal   Collection Time: 09/20/20 12:16 PM   Specimen: In/Out  Cath Urine  Result Value Ref Range Status   Specimen Description   Final    IN/OUT CATH URINE Performed at [facility], [address] [place], [place] [address]    Special Requests    Final    NONE Performed at [facility], [address] [place], [place] [address]    Culture >=100,000 COLONIES/mL STAPHYLOCOCCUS AUREUS (A)  Final   Report Status 09/22/2020 FINAL  Final   Organism ID, Bacteria STAPHYLOCOCCUS AUREUS (A)  Final      Susceptibility   Staphylococcus aureus - MIC*    CIPROFLOXACIN <=0.5 SENSITIVE Sensitive     GENTAMICIN <=0.5 SENSITIVE Sensitive     NITROFURANTOIN <=16 SENSITIVE Sensitive     OXACILLIN <=0.25 SENSITIVE Sensitive     TETRACYCLINE <=1 SENSITIVE Sensitive     VANCOMYCIN <=0.5 SENSITIVE Sensitive     TRIMETH/SULFA <=10 SENSITIVE Sensitive     CLINDAMYCIN RESISTANT Resistant     RIFAMPIN <=0.5 SENSITIVE Sensitive     Inducible Clindamycin POSITIVE Resistant     * >=100,000 COLONIES/mL STAPHYLOCOCCUS AUREUS  Resp Panel by RT-PCR (Flu A&B, Covid) Nasopharyngeal Swab     Status: None   Collection Time: 09/20/20 12:16 PM   Specimen: Nasopharyngeal Swab; Nasopharyngeal(NP) swabs in vial transport medium  Result Value Ref Range Status   SARS Coronavirus 2 by RT PCR NEGATIVE NEGATIVE Final    Comment: (NOTE) SARS-CoV-2 target nucleic acids are NOT DETECTED.  The SARS-CoV-2 RNA is generally detectable in upper respiratory specimens during the acute phase of infection. The lowest concentration of SARS-CoV-2 viral copies this assay can detect is 138 copies/mL. A negative result does not preclude SARS-Cov-2 infection and should not be used as the sole basis for treatment or other patient management decisions. A negative result may occur with  improper specimen collection/handling, submission of specimen other than nasopharyngeal swab, presence of viral mutation(s) within the areas targeted by this assay, and inadequate number of viral copies(<138 copies/mL). A negative result must be combined with clinical observations, patient history, and epidemiological information. The expected result is Negative.  Fact Sheet for Patients:   EntrepreneurPulse.com.au  Fact Sheet for Healthcare Providers:  IncredibleEmployment.be  This test is no t yet approved or cleared by the [place] FDA and  has been authorized for detection and/or diagnosis of SARS-CoV-2 by FDA under an Emergency Use Authorization (EUA). This EUA will remain  in effect (meaning this test can be used) for the duration of the COVID-19 declaration under Section 564(b)(1) of the Act, 21 U.S.C.section 360bbb-3(b)(1), unless the authorization is terminated  or revoked sooner.       Influenza A by PCR NEGATIVE NEGATIVE Final   Influenza B by PCR NEGATIVE NEGATIVE Final    Comment: (NOTE) The Xpert Xpress SARS-CoV-2/FLU/RSV plus assay is intended as an aid in the diagnosis of influenza from Nasopharyngeal swab specimens and should not be used as a sole basis for treatment. Nasal washings and aspirates are unacceptable for Xpert Xpress SARS-CoV-2/FLU/RSV testing.  Fact Sheet for Patients: EntrepreneurPulse.com.au  Fact Sheet for Healthcare Providers: IncredibleEmployment.be  This test is not yet approved or cleared by the [place] FDA and has been authorized for detection and/or diagnosis of SARS-CoV-2 by FDA under an Emergency Use Authorization (EUA). This EUA will remain in effect (meaning this test can be used) for the duration of the COVID-19 declaration under Section 564(b)(1) of the Act, 21 U.S.C. section 360bbb-3(b)(1), unless the authorization is terminated or revoked.  Performed at [facility], [address] [place], [place] [address]   Blood culture (single)     Status: None (Preliminary result)   Collection Time: 09/20/20  3:22 PM   Specimen: Left Antecubital; Blood  Result Value Ref Range Status   Specimen Description LEFT ANTECUBITAL  Final   Special Requests   Final    BOTTLES DRAWN AEROBIC AND ANAEROBIC Blood Culture adequate volume    Culture   Final    NO GROWTH 2 DAYS Performed at [facility], [address], [place], [place] [address]    Report Status PENDING  Incomplete   70 year old female presenting with AMS, hypotension, and dehydration. Pt denied dysuria. BCx NGx3d.  [x]  Patient discharged originally without antimicrobial agent and treatment is now indicated  New antibiotic prescription: Keflex 500 mg q6h x 5d  ED Provider: Alita Chyle, PharmD Pharmacy Resident  09/23/2020 1:18 PM

## 2020-09-24 ENCOUNTER — Emergency Department: Payer: Medicare Other

## 2020-09-24 ENCOUNTER — Other Ambulatory Visit: Payer: Self-pay

## 2020-09-24 ENCOUNTER — Emergency Department
Admission: EM | Admit: 2020-09-24 | Discharge: 2020-09-25 | Disposition: A | Discharge status: Home / Self Care | Payer: Medicare Other | Attending: Emergency Medicine | Admitting: Emergency Medicine

## 2020-09-24 ENCOUNTER — Ambulatory Visit: Payer: No Typology Code available for payment source | Admitting: Internal Medicine

## 2020-09-24 DIAGNOSIS — Z8572 Personal history of non-Hodgkin lymphomas: Secondary | ICD-10-CM | POA: Diagnosis not present

## 2020-09-24 DIAGNOSIS — R4182 Altered mental status, unspecified: Secondary | ICD-10-CM | POA: Insufficient documentation

## 2020-09-24 DIAGNOSIS — Z86018 Personal history of other benign neoplasm: Secondary | ICD-10-CM | POA: Insufficient documentation

## 2020-09-24 DIAGNOSIS — R451 Restlessness and agitation: Secondary | ICD-10-CM | POA: Insufficient documentation

## 2020-09-24 DIAGNOSIS — I10 Essential (primary) hypertension: Secondary | ICD-10-CM | POA: Diagnosis not present

## 2020-09-24 DIAGNOSIS — G20 Parkinson's disease: Secondary | ICD-10-CM | POA: Insufficient documentation

## 2020-09-24 DIAGNOSIS — F0281 Dementia in other diseases classified elsewhere with behavioral disturbance: Secondary | ICD-10-CM | POA: Diagnosis not present

## 2020-09-24 DIAGNOSIS — Z96652 Presence of left artificial knee joint: Secondary | ICD-10-CM | POA: Diagnosis not present

## 2020-09-24 DIAGNOSIS — Z87891 Personal history of nicotine dependence: Secondary | ICD-10-CM | POA: Diagnosis not present

## 2020-09-24 MED ORDER — SODIUM CHLORIDE 0.9 % IV BOLUS (SEPSIS)
1000.0000 mL | Freq: Once | INTRAVENOUS | Status: AC
  Administered 2020-09-24: 1000 mL via INTRAVENOUS

## 2020-09-24 MED ORDER — LORAZEPAM 2 MG/ML IJ SOLN: 0.5000 mg | Freq: Once | INTRAMUSCULAR | Status: DC

## 2020-09-24 NOTE — ED Triage Notes (Signed)
PT brought in by [facility] from the [facility] for combative behavior and aggression.

## 2020-09-24 NOTE — ED Provider Notes (Incomplete)
[facility] Emergency Department Provider Note  ____________________________________________   Event Date/Time   First MD Initiated Contact with Patient 09/24/20 2345     (approximate)  I have reviewed the triage vital signs and the nursing notes.   HISTORY  Chief Complaint Altered Mental Status    HPI Sarah Donaldson is a 70 y.o. female with history of Parkinson's disease, dementia who presents to the emergency department EMS from [facility] for combative behavior.  EMS reports when they arrived she was rolling around on the floor.  It appears on review of her records, patient is here in the emergency department frequently for the same.  Spoke with staff at the [facility].  She is a new resident at the [facility]. Arrived 4 days ago.  Was fine last night but was "having fits" today.  She was cursing, violent.  They said she was going to strike a resident.         Past Medical History:  Diagnosis Date  . Cancer ([place])    Non-hodgkin's lymphoma (in remission)  . GERD (gastroesophageal reflux disease)    well-controlled w/ omeprazole  . Hyperlipidemia   . Knee joint cyst, left 02/07/2018  . Parkinson disease ([place])   . Parkinson disease [facility])     Patient Active Problem List   Diagnosis Date Noted  . Essential hypertension 06/02/2020  . Need for influenza vaccination 06/02/2020  . Encounter for annual health check of caregiver 04/08/2020  . Altered mental status   . Dementia due to Parkinson's disease with behavioral disturbance ([place])   . SDH (subdural hematoma) ([place]) 12/11/2019  . Falls frequently 12/11/2019  . S/P total knee replacement, left 06/07/2019  . Osteoarthritis of knee 04/27/2019  . Panic attacks 04/07/2019  . Psychosis ([place]) 04/04/2019  . Numbness 03/09/2018  . Lumbar spondylosis 03/07/2018  . Chronic pain of left knee 03/07/2018  . Chronic upper back pain 12/08/2017  . Benign neoplasm of hand 10/20/2017  . Chronic pain syndrome 10/20/2017  .  Chronic pain of right lower extremity 10/20/2017  . Chronic myofascial pain 10/20/2017  . Numbness and tingling 07/12/2017  . Sleep difficulties 07/12/2017  . Low back pain 01/18/2017  . Sleep behavior disorder, REM 01/18/2017  . Lumbar radiculopathy 11/01/2016  . Follicular lymphoma of extranodal and solid organ sites [facility]) 05/01/2016  . Lymphoma ([place]) 04/30/2016  . Hallux limitus 07/27/2015  . Low back pain with sciatica 02/13/2015  . Bilateral low back pain with sciatica 02/13/2015  . H/O adenomatous polyp of colon 04/06/2014  . Hx of adenomatous colonic polyps 04/06/2014  . Back ache 01/25/2014  . Adiposity 01/25/2014  . REM sleep behavior disorder 01/25/2014  . Disordered sleep 01/25/2014  . Has a tremor 01/25/2014  . Obesity, unspecified 01/25/2014  . Sleep disorder 01/25/2014  . Backache 01/25/2014  . Tremor 01/25/2014  . Obesity 01/25/2014  . Difficulty hearing 06/08/2012  . Hearing loss 06/08/2012  . Anxiety disorder 06/06/2012  . Colon polyp 06/06/2012  . Clinical depression 06/06/2012  . Hypercholesteremia 06/06/2012  . Idiopathic Parkinson's disease ([place]) 06/06/2012  . Detached retina 06/06/2012  . Depressive disorder 06/06/2012  . Retinal detachment 06/06/2012  . Parkinson's disease ([place]) 06/06/2012    Past Surgical History:  Procedure Laterality Date  . BACK SURGERY    . BUNIONECTOMY    . NASAL SEPTUM SURGERY    . RETINAL DETACHMENT SURGERY    . TOTAL KNEE ARTHROPLASTY Left 06/07/2019   Procedure: TOTAL KNEE ARTHROPLASTY;  Surgeon: Harlow Mares,  Elyn Aquas, MD;  Location: [facility] ORS;  Service: Orthopedics;  Laterality: Left;    Prior to Admission medications   Medication Sig Start Date End Date Taking? Authorizing Provider  acetaminophen (TYLENOL) 650 MG CR tablet Take 650-1,300 mg by mouth in the morning and at bedtime.    [provider]  ALPRAZolam Duanne Moron) 0.25 MG tablet Take 0.25-0.5 mg by mouth daily as needed for anxiety.    [provider]   carbidopa-levodopa (SINEMET IR) 25-100 MG tablet Take 2 tablets by mouth every 2 (two) hours. (between the hours of 8AM and 8PM)    [provider]  clonazePAM (KLONOPIN) 2 MG tablet Take 1 mg by mouth 4 (four) times daily. 04/03/20   [provider]  clotrimazole-betamethasone (LOTRISONE) cream Apply 1 application topically 2 (two) times daily. Patient not taking: Reported on 09/14/2020 07/16/20   Edrick Kins, DPM  entacapone (COMTAN) 200 MG tablet Take 200 mg by mouth 6 (six) times daily. (May take 1 additional tablet (200mg ) by mouth at 8PM if needed)    [provider]  escitalopram (LEXAPRO) 10 MG tablet Take 10 mg by mouth daily.    [provider]  HYDROcodone-acetaminophen (NORCO/VICODIN) 5-325 MG tablet Take 1 tablet by mouth 3 (three) times daily as needed for moderate pain.    [provider]  pregabalin (LYRICA) 100 MG capsule Take 100 mg by mouth 3 (three) times daily. 11/18/19   [provider]  QUEtiapine (SEROQUEL) 100 MG tablet Take 1 tablet (100 mg total) by mouth at bedtime. 08/08/20   Clapacs, Madie Reno, MD  QUEtiapine (SEROQUEL) 50 MG tablet Take 1 tablet (50 mg total) by mouth 3 (three) times daily. 08/08/20   Clapacs, Madie Reno, MD  traZODone (DESYREL) 50 MG tablet Take 50 mg by mouth at bedtime.    [provider]    Allergies Percocet [oxycodone-acetaminophen], Baclofen, Codeine sulfate, Gabapentin, Terazosin, and Tramadol hcl  Family History  Problem Relation Age of Onset  . Heart disease Mother   . Alcohol abuse Father   . Breast cancer Neg Hx   . Mental illness Neg Hx     Social History Social History   Tobacco Use  . Smoking status: Former Research scientist (life sciences)  . Smokeless tobacco: Never Used  Vaping Use  . Vaping Use: Never used  Substance Use Topics  . Alcohol use: Yes    Alcohol/week: 0.0 standard drinks  . Drug use: No    Review of Systems -level 5 caveat secondary to confusion,  dementia  ____________________________________________   PHYSICAL EXAM:  VITAL SIGNS: ED Triage Vitals  Enc Vitals Group     BP 09/24/20 2348 (!) 153/84     Pulse Rate 09/24/20 2348 60     Resp 09/24/20 2348 20     Temp 09/24/20 2348 97.6 F (36.4 C)     Temp Source 09/24/20 2348 Oral     SpO2 09/24/20 2348 95 %     Weight 09/24/20 2349 147 lb 11.3 oz (67 kg)     Height 09/24/20 2349 5\' 7"  (1.702 m)     Head Circumference --      Peak Flow --      Pain Score 09/24/20 2349 0     Pain Loc --      Pain Edu? --      Excl. in [place]? --    CONSTITUTIONAL: Alert and oriented and responds appropriately to questions.  Elderly HEAD: Normocephalic, appears atraumatic, has old bruising noted  around both eyes and nose EYES: Conjunctivae clear, pupils appear equal, EOM appear intact ENT: normal nose; moist mucous membranes NECK: Supple, normal ROM, no midline step-off or deformity CARD: RRR; S1 and S2 appreciated; no murmurs, no clicks, no rubs, no gallops RESP: Normal chest excursion without splinting or tachypnea; breath sounds clear and equal bilaterally; no wheezes, no rhonchi, no rales, no hypoxia or respiratory distress, speaking full sentences ABD/GI: Normal bowel sounds; non-distended; soft, non-tender, no rebound, no guarding, no peritoneal signs, no hepatosplenomegaly BACK: The back appears normal EXT: Normal ROM in all joints; no deformity noted, no edema; no cyanosis SKIN: Normal color for age and race; warm; no rash on exposed skin NEURO: Moves all extremities equally, no facial asymmetry, speech is difficult to understand PSYCH: Agitated.  ____________________________________________   LABS (all labs ordered are listed, but only abnormal results are displayed)  Labs Reviewed  CBC WITH DIFFERENTIAL/PLATELET  COMPREHENSIVE METABOLIC PANEL  URINALYSIS, ROUTINE W REFLEX MICROSCOPIC  CBG MONITORING, ED    ____________________________________________  EKG  *** ____________________________________________  RADIOLOGY I, Kristen Ward, personally viewed and evaluated these images (plain radiographs) as part of my medical decision making, as well as reviewing the written report by the radiologist.  ED MD interpretation:  ***  Official radiology report(s): No results found.  ____________________________________________   PROCEDURES  Procedure(s) performed (including Critical Care):  Procedures  CRITICAL CARE Performed by: Pryor Curia   Total critical care time: *** minutes  Critical care time was exclusive of separately billable procedures and treating other patients.  Critical care was necessary to treat or prevent imminent or life-threatening deterioration.  Critical care was time spent personally by me on the following activities: development of treatment plan with patient and/or surrogate as well as nursing, discussions with consultants, evaluation of patient's response to treatment, examination of patient, obtaining history from patient or surrogate, ordering and performing treatments and interventions, ordering and review of laboratory studies, ordering and review of radiographic studies, pulse oximetry and re-evaluation of patient's condition.  ____________________________________________   INITIAL IMPRESSION / ASSESSMENT AND PLAN / ED COURSE  As part of my medical decision making, I reviewed the following data within the [facility] {[id number]::"Notes from prior ED visits","[place] Controlled Substance Database"}         ***      ____________________________________________   FINAL CLINICAL IMPRESSION(S) / ED DIAGNOSES  Final diagnoses:  None     ED Discharge Orders    None      *Please note:  Aoife A Caroll was evaluated in Emergency Department on 09/24/2020 for the symptoms described in the history of present illness. She was  evaluated in the context of the global COVID-19 pandemic, which necessitated consideration that the patient might be at risk for infection with the SARS-CoV-2 virus that causes COVID-19. Institutional protocols and algorithms that pertain to the evaluation of patients at risk for COVID-19 are in a state of rapid change based on information released by regulatory bodies including the CDC and federal and state organizations. These policies and algorithms were followed during the patient's care in the ED.  Some ED evaluations and interventions may be delayed as a result of limited staffing during and the pandemic.*   Note:  This document was prepared using Dragon voice recognition software and may include unintentional dictation errors.

## 2020-09-24 NOTE — ED Provider Notes (Signed)
[facility] Emergency Department Provider Note  ____________________________________________   Event Date/Time   First MD Initiated Contact with Patient 09/24/20 2345     (approximate)  I have reviewed the triage vital signs and the nursing notes.   HISTORY  Chief Complaint Altered Mental Status    HPI Sarah Donaldson is a 70 y.o. female with history of Parkinson's disease, dementia who presents to the emergency department EMS from [facility] for combative behavior.  EMS reports when they arrived she was rolling around on the floor.  It appears on review of her records, patient is here in the emergency department frequently for the same.  Spoke with staff at the [facility].  She is a new resident at the [facility]. Arrived 4 days ago on Saturday, February 12.  Was fine last night but was "having fits" today.  She was cursing, violent.  They said she was going to strike a resident.      Spoke to patient's husband.  He reports that these episodes are not abnormal for her.  He states he last saw her tonight at 8 PM and she was doing fine.  He states that these episodes is why she is no longer living at home and was transition to a nursing facility.  He states it is normal to have difficulty understanding her speech with these "episodes of anxiety".  He is hoping that medications that were started recently will start to help her.  Past Medical History:  Diagnosis Date  . Cancer ([place])    Non-hodgkin's lymphoma (in remission)  . GERD (gastroesophageal reflux disease)    well-controlled w/ omeprazole  . Hyperlipidemia   . Knee joint cyst, left 02/07/2018  . Parkinson disease ([place])   . Parkinson disease [facility])     Patient Active Problem List   Diagnosis Date Noted  . Essential hypertension 06/02/2020  . Need for influenza vaccination 06/02/2020  . Encounter for annual health check of caregiver 04/08/2020  . Altered mental status   . Dementia due to Parkinson's disease with  behavioral disturbance ([place])   . SDH (subdural hematoma) ([place]) 12/11/2019  . Falls frequently 12/11/2019  . S/P total knee replacement, left 06/07/2019  . Osteoarthritis of knee 04/27/2019  . Panic attacks 04/07/2019  . Psychosis ([place]) 04/04/2019  . Numbness 03/09/2018  . Lumbar spondylosis 03/07/2018  . Chronic pain of left knee 03/07/2018  . Chronic upper back pain 12/08/2017  . Benign neoplasm of hand 10/20/2017  . Chronic pain syndrome 10/20/2017  . Chronic pain of right lower extremity 10/20/2017  . Chronic myofascial pain 10/20/2017  . Numbness and tingling 07/12/2017  . Sleep difficulties 07/12/2017  . Low back pain 01/18/2017  . Sleep behavior disorder, REM 01/18/2017  . Lumbar radiculopathy 11/01/2016  . Follicular lymphoma of extranodal and solid organ sites [facility]) 05/01/2016  . Lymphoma ([place]) 04/30/2016  . Hallux limitus 07/27/2015  . Low back pain with sciatica 02/13/2015  . Bilateral low back pain with sciatica 02/13/2015  . H/O adenomatous polyp of colon 04/06/2014  . Hx of adenomatous colonic polyps 04/06/2014  . Back ache 01/25/2014  . Adiposity 01/25/2014  . REM sleep behavior disorder 01/25/2014  . Disordered sleep 01/25/2014  . Has a tremor 01/25/2014  . Obesity, unspecified 01/25/2014  . Sleep disorder 01/25/2014  . Backache 01/25/2014  . Tremor 01/25/2014  . Obesity 01/25/2014  . Difficulty hearing 06/08/2012  . Hearing loss 06/08/2012  . Anxiety disorder 06/06/2012  . Colon polyp 06/06/2012  .  Clinical depression 06/06/2012  . Hypercholesteremia 06/06/2012  . Idiopathic Parkinson's disease ([place]) 06/06/2012  . Detached retina 06/06/2012  . Depressive disorder 06/06/2012  . Retinal detachment 06/06/2012  . Parkinson's disease ([place]) 06/06/2012    Past Surgical History:  Procedure Laterality Date  . BACK SURGERY    . BUNIONECTOMY    . NASAL SEPTUM SURGERY    . RETINAL DETACHMENT SURGERY    . TOTAL KNEE ARTHROPLASTY Left 06/07/2019   Procedure:  TOTAL KNEE ARTHROPLASTY;  Surgeon: Lovell Sheehan, MD;  Location: [facility] ORS;  Service: Orthopedics;  Laterality: Left;    Prior to Admission medications   Medication Sig Start Date End Date Taking? Authorizing Provider  acetaminophen (TYLENOL) 650 MG CR tablet Take 650-1,300 mg by mouth in the morning and at bedtime.    [provider]  ALPRAZolam Duanne Moron) 0.25 MG tablet Take 0.25-0.5 mg by mouth daily as needed for anxiety.    [provider]  carbidopa-levodopa (SINEMET IR) 25-100 MG tablet Take 2 tablets by mouth every 2 (two) hours. (between the hours of 8AM and 8PM)    [provider]  clonazePAM (KLONOPIN) 2 MG tablet Take 1 mg by mouth 4 (four) times daily. 04/03/20   [provider]  clotrimazole-betamethasone (LOTRISONE) cream Apply 1 application topically 2 (two) times daily. Patient not taking: Reported on 09/14/2020 07/16/20   Edrick Kins, DPM  entacapone (COMTAN) 200 MG tablet Take 200 mg by mouth 6 (six) times daily. (May take 1 additional tablet (200mg ) by mouth at 8PM if needed)    [provider]  escitalopram (LEXAPRO) 10 MG tablet Take 10 mg by mouth daily.    [provider]  HYDROcodone-acetaminophen (NORCO/VICODIN) 5-325 MG tablet Take 1 tablet by mouth 3 (three) times daily as needed for moderate pain.    [provider]  pregabalin (LYRICA) 100 MG capsule Take 100 mg by mouth 3 (three) times daily. 11/18/19   [provider]  QUEtiapine (SEROQUEL) 100 MG tablet Take 1 tablet (100 mg total) by mouth at bedtime. 08/08/20   Clapacs, Madie Reno, MD  QUEtiapine (SEROQUEL) 50 MG tablet Take 1 tablet (50 mg total) by mouth 3 (three) times daily. 08/08/20   Clapacs, Madie Reno, MD  traZODone (DESYREL) 50 MG tablet Take 50 mg by mouth at bedtime.    [provider]    Allergies Percocet [oxycodone-acetaminophen], Baclofen, Codeine sulfate, Gabapentin, Terazosin, and Tramadol hcl  Family History  Problem  Relation Age of Onset  . Heart disease Mother   . Alcohol abuse Father   . Breast cancer Neg Hx   . Mental illness Neg Hx     Social History Social History   Tobacco Use  . Smoking status: Former Research scientist (life sciences)  . Smokeless tobacco: Never Used  Vaping Use  . Vaping Use: Never used  Substance Use Topics  . Alcohol use: Yes    Alcohol/week: 0.0 standard drinks  . Drug use: No    Review of Systems -level 5 caveat secondary to confusion, dementia  ____________________________________________   PHYSICAL EXAM:  VITAL SIGNS: ED Triage Vitals  Enc Vitals Group     BP 09/24/20 2348 (!) 153/84     Pulse Rate 09/24/20 2348 60     Resp 09/24/20 2348 20     Temp 09/24/20 2348 97.6 F (36.4 C)     Temp Source 09/24/20 2348 Oral     SpO2 09/24/20 2348 95 %     Weight 09/24/20 2349 147 lb  11.3 oz (67 kg)     Height 09/24/20 2349 5\' 7"  (1.702 m)     Head Circumference --      Peak Flow --      Pain Score 09/24/20 2349 0     Pain Loc --      Pain Edu? --      Excl. in GC? --    CONSTITUTIONAL: Alert.  Unable to answer questions or follow commands.  Slightly agitated.  Elderly HEAD: Normocephalic, appears atraumatic, has old bruising noted around both eyes and nose EYES: Conjunctivae clear, pupils appear equal, EOM appear intact ENT: normal nose; moist mucous membranes NECK: Supple, normal ROM, no midline step-off or deformity CARD: RRR; S1 and S2 appreciated; no murmurs, no clicks, no rubs, no gallops RESP: Normal chest excursion without splinting or tachypnea; breath sounds clear and equal bilaterally; no wheezes, no rhonchi, no rales, no hypoxia or respiratory distress, speaking full sentences ABD/GI: Normal bowel sounds; non-distended; soft, non-tender, no rebound, no guarding, no peritoneal signs, no hepatosplenomegaly BACK: The back appears normal EXT: Normal ROM in all joints; no deformity noted, no edema; no cyanosis SKIN: Normal color for age and race; warm; no rash on exposed  skin NEURO: Moves all extremities equally, no facial asymmetry, speech is difficult to understand PSYCH: Agitated.  ____________________________________________   LABS (all labs ordered are listed, but only abnormal results are displayed)  Labs Reviewed  CBC WITH DIFFERENTIAL/PLATELET - Abnormal; Notable for the following components:      Result Value   Platelets 148 (*)    All other components within normal limits  COMPREHENSIVE METABOLIC PANEL - Abnormal; Notable for the following components:   Glucose, Bld 108 (*)    Calcium 8.8 (*)    Total Protein 6.1 (*)    All other components within normal limits  URINALYSIS, ROUTINE W REFLEX MICROSCOPIC - Abnormal; Notable for the following components:   Color, Urine AMBER (*)    APPearance CLEAR (*)    Ketones, ur 20 (*)    All other components within normal limits   ____________________________________________  EKG   Date: 09/25/2020 23:48   Rate: 61  Rhythm: normal sinus rhythm  QRS Axis: normal  Intervals: normal  ST/T Wave abnormalities: normal  Conduction Disutrbances: none  Narrative Interpretation: unremarkable     ____________________________________________  RADIOLOGY I, Amahd Morino, personally viewed and evaluated these images (plain radiographs) as part of my medical decision making, as well as reviewing the written report by the radiologist.  ED MD interpretation: Head CT shows no acute intracranial abnormality.  Official radiology report(s): CT Head Wo Contrast  Result Date: 09/25/2020 CLINICAL DATA:  Altered mental status. EXAM: CT HEAD WITHOUT CONTRAST TECHNIQUE: Contiguous axial images were obtained from the base of the skull through the vertex without intravenous contrast. COMPARISON:  September 20, 2020 FINDINGS: Brain: There is mild cerebral atrophy with widening of the extra-axial spaces and ventricular dilatation. There are areas of decreased attenuation within the white matter tracts of the  supratentorial brain, consistent with microvascular disease changes. Vascular: No hyperdense vessel or unexpected calcification. Skull: Normal. Negative for fracture or focal lesion. Sinuses/Orbits: There is mild sphenoid sinus mucosal thickening. Evidence of prior scleral banding is seen on the left. Other: None. IMPRESSION: 1. No acute intracranial abnormality. 2. Mild sphenoid sinus disease. Electronically Signed   By: Virgina Norfolk M.D.   On: 09/25/2020 00:53    ____________________________________________   PROCEDURES  Procedure(s) performed (including Critical Care):  Procedures  ____________________________________________  INITIAL IMPRESSION / ASSESSMENT AND PLAN / ED COURSE  As part of my medical decision making, I reviewed the following data within the [facility] History obtained from family, Nursing notes reviewed and incorporated, Labs reviewed, EKG interpreted NSR, Old chart reviewed and Notes from prior ED visits         Patient here with agitated behavior.  She is currently calm and redirectable.  Vital signs within normal limits.  EKG shows no ischemia.  She is afebrile.  She appears to be moving all extremities equally without facial asymmetry.  Her speech is difficult to understand but has been reports this is normal for her during these episodes.  Will give Ativan as needed for agitation but currently she is calm.  She does appear dry on exam with dry mucous membranes.  Will give IV fluids.  Will obtain labs, urine, head CT to evaluate for any possible other causes for her increased agitation tonight including electrolyte derangement, anemia, UTI, intracranial hemorrhage, stroke.    12:57 AM  Pt's labs show no significant abnormality.  No leukocytosis.  Normal hemoglobin.  Normal electrolytes.  Normal glucose.  Urine shows no sign of infection.  Does show small ketones and she has received IV fluids here.  Head CT shows no acute abnormality.  She is  resting comfortably, calm.  She has not received any medication for agitation here.  She is at her baseline per husband.  Will discharge back to her nursing facility.   At this time, I do not feel there is any life-threatening condition present. I have reviewed, interpreted and discussed all results (EKG, imaging, lab, urine as appropriate) and exam findings with patient/family. I have reviewed nursing notes and appropriate previous records.  I feel the patient is safe to be discharged home without further emergent workup and can continue workup as an outpatient as needed. Discussed usual and customary return precautions. Patient/family verbalize understanding and are comfortable with this plan.  Outpatient follow-up has been provided as needed. All questions have been answered.  ____________________________________________   FINAL CLINICAL IMPRESSION(S) / ED DIAGNOSES  Final diagnoses:  Agitation     ED Discharge Orders    None      *Please note:  Sadako A Reiger was evaluated in Emergency Department on 09/25/2020 for the symptoms described in the history of present illness. She was evaluated in the context of the global COVID-19 pandemic, which necessitated consideration that the patient might be at risk for infection with the SARS-CoV-2 virus that causes COVID-19. Institutional protocols and algorithms that pertain to the evaluation of patients at risk for COVID-19 are in a state of rapid change based on information released by regulatory bodies including the CDC and federal and state organizations. These policies and algorithms were followed during the patient's care in the ED.  Some ED evaluations and interventions may be delayed as a result of limited staffing during and the pandemic.*   Note:  This document was prepared using Dragon voice recognition software and may include unintentional dictation errors.   Riley Hallum, Delice Bison, DO 09/25/20 [phone]

## 2020-09-25 ENCOUNTER — Other Ambulatory Visit: Payer: Self-pay | Admitting: Psychiatry

## 2020-09-25 ENCOUNTER — Emergency Department: Payer: Medicare Other

## 2020-09-25 DIAGNOSIS — R451 Restlessness and agitation: Secondary | ICD-10-CM | POA: Diagnosis not present

## 2020-09-25 LAB — COMPREHENSIVE METABOLIC PANEL
ALT: <5 U/L (ref 0–44)
AST: 16 U/L (ref 15–41)
Albumin: 3.8 g/dL (ref 3.5–5.0)
Alkaline Phosphatase: 99 U/L (ref 38–126)
Anion gap: 11 (ref 5–15)
BUN: 18 mg/dL (ref 8–23)
CO2: 23 mmol/L (ref 22–32)
Calcium: 8.8 mg/dL — ABNORMAL LOW (ref 8.9–10.3)
Chloride: 107 mmol/L (ref 98–111)
Creatinine, Ser: 0.44 mg/dL (ref 0.44–1.00)
GFR, Estimated: >60 mL/min (ref >60)
Glucose, Bld: 108 mg/dL — ABNORMAL HIGH (ref 70–99)
Potassium: 4.1 mmol/L (ref 3.5–5.1)
Sodium: 141 mmol/L (ref 135–145)
Total Bilirubin: 1 mg/dL (ref 0.3–1.2)
Total Protein: 6.1 g/dL — ABNORMAL LOW (ref 6.5–8.1)

## 2020-09-25 LAB — CBC WITH DIFFERENTIAL/PLATELET
Abs Immature Granulocytes: 0.02 10*3/uL (ref 0.00–0.07)
Basophils Absolute: 0 10*3/uL (ref 0.0–0.1)
Basophils Relative: 0 %
Eosinophils Absolute: 0.1 10*3/uL (ref 0.0–0.5)
Eosinophils Relative: 3 %
HCT: 40.3 % (ref 36.0–46.0)
Hemoglobin: 12.3 g/dL (ref 12.0–15.0)
Immature Granulocytes: 0 %
Lymphocytes Relative: 29 %
Lymphs Abs: 1.4 10*3/uL (ref 0.7–4.0)
MCH: 27.9 pg (ref 26.0–34.0)
MCHC: 30.5 g/dL (ref 30.0–36.0)
MCV: 91.4 fL (ref 80.0–100.0)
Monocytes Absolute: 0.4 10*3/uL (ref 0.1–1.0)
Monocytes Relative: 9 %
Neutro Abs: 2.8 10*3/uL (ref 1.7–7.7)
Neutrophils Relative %: 59 %
Platelets: 148 10*3/uL — ABNORMAL LOW (ref 150–400)
RBC: 4.41 MIL/uL (ref 3.87–5.11)
RDW: 14.5 % (ref 11.5–15.5)
WBC: 4.7 10*3/uL (ref 4.0–10.5)
nRBC: 0 % (ref 0.0–0.2)

## 2020-09-25 LAB — URINALYSIS, ROUTINE W REFLEX MICROSCOPIC
Bilirubin Urine: NEGATIVE
Glucose, UA: NEGATIVE mg/dL
Hgb urine dipstick: NEGATIVE
Ketones, ur: 20 mg/dL — AB
Leukocytes,Ua: NEGATIVE
Nitrite: NEGATIVE
Protein, ur: NEGATIVE mg/dL
Specific Gravity, Urine: 1.03 (ref 1.005–1.030)
pH: 5 (ref 5.0–8.0)

## 2020-09-25 LAB — CULTURE, BLOOD (SINGLE): Culture: NO GROWTH

## 2020-09-25 NOTE — ED Notes (Signed)
Report called to [facility], awaiting EMs transport back.

## 2020-09-25 NOTE — Discharge Instructions (Signed)
Patient's labs, urine, head CT, EKG showed no acute abnormalities.  Her intermittent agitation is her baseline per her husband.  She has been calm, cooperative in the emergency department.

## 2020-09-26 LAB — CULTURE, BLOOD (SINGLE)
Culture: NO GROWTH
Special Requests: ADEQUATE

## 2020-09-27 ENCOUNTER — Other Ambulatory Visit: Payer: Medicare Other | Admitting: Nurse Practitioner

## 2020-09-27 ENCOUNTER — Other Ambulatory Visit: Payer: Self-pay

## 2020-10-02 ENCOUNTER — Emergency Department
Admission: EM | Admit: 2020-10-02 | Discharge: 2020-10-03 | Disposition: A | Discharge status: Home / Self Care | Payer: Medicare Other | Attending: Emergency Medicine | Admitting: Emergency Medicine

## 2020-10-02 ENCOUNTER — Emergency Department: Payer: Medicare Other

## 2020-10-02 ENCOUNTER — Other Ambulatory Visit: Payer: Self-pay

## 2020-10-02 DIAGNOSIS — I10 Essential (primary) hypertension: Secondary | ICD-10-CM | POA: Diagnosis not present

## 2020-10-02 DIAGNOSIS — Z96652 Presence of left artificial knee joint: Secondary | ICD-10-CM | POA: Diagnosis not present

## 2020-10-02 DIAGNOSIS — Z87891 Personal history of nicotine dependence: Secondary | ICD-10-CM | POA: Insufficient documentation

## 2020-10-02 DIAGNOSIS — F0280 Dementia in other diseases classified elsewhere without behavioral disturbance: Secondary | ICD-10-CM | POA: Diagnosis present

## 2020-10-02 DIAGNOSIS — F0281 Dementia in other diseases classified elsewhere with behavioral disturbance: Secondary | ICD-10-CM | POA: Diagnosis not present

## 2020-10-02 DIAGNOSIS — Z20822 Contact with and (suspected) exposure to covid-19: Secondary | ICD-10-CM | POA: Insufficient documentation

## 2020-10-02 DIAGNOSIS — G20 Parkinson's disease: Secondary | ICD-10-CM | POA: Diagnosis present

## 2020-10-02 DIAGNOSIS — Z046 Encounter for general psychiatric examination, requested by authority: Secondary | ICD-10-CM | POA: Diagnosis present

## 2020-10-02 DIAGNOSIS — G3183 Dementia with Lewy bodies: Secondary | ICD-10-CM | POA: Diagnosis not present

## 2020-10-02 DIAGNOSIS — F419 Anxiety disorder, unspecified: Secondary | ICD-10-CM | POA: Diagnosis present

## 2020-10-02 DIAGNOSIS — Z8572 Personal history of non-Hodgkin lymphomas: Secondary | ICD-10-CM | POA: Insufficient documentation

## 2020-10-02 DIAGNOSIS — R4182 Altered mental status, unspecified: Secondary | ICD-10-CM | POA: Diagnosis present

## 2020-10-02 LAB — CBC
HCT: 37.7 % (ref 36.0–46.0)
Hemoglobin: 11.8 g/dL — ABNORMAL LOW (ref 12.0–15.0)
MCH: 27.5 pg (ref 26.0–34.0)
MCHC: 31.3 g/dL (ref 30.0–36.0)
MCV: 87.9 fL (ref 80.0–100.0)
Platelets: 182 10*3/uL (ref 150–400)
RBC: 4.29 MIL/uL (ref 3.87–5.11)
RDW: 14.7 % (ref 11.5–15.5)
WBC: 6.8 10*3/uL (ref 4.0–10.5)
nRBC: 0 % (ref 0.0–0.2)

## 2020-10-02 LAB — URINALYSIS, COMPLETE (UACMP) WITH MICROSCOPIC
Bacteria, UA: NONE SEEN
Bilirubin Urine: NEGATIVE
Glucose, UA: NEGATIVE mg/dL
Hgb urine dipstick: NEGATIVE
Ketones, ur: 20 mg/dL — AB
Leukocytes,Ua: NEGATIVE
Nitrite: NEGATIVE
Protein, ur: NEGATIVE mg/dL
Specific Gravity, Urine: 1.031 — ABNORMAL HIGH (ref 1.005–1.030)
pH: 5 (ref 5.0–8.0)

## 2020-10-02 LAB — RESP PANEL BY RT-PCR (FLU A&B, COVID) ARPGX2
Influenza A by PCR: NEGATIVE
Influenza B by PCR: NEGATIVE
SARS Coronavirus 2 by RT PCR: NEGATIVE

## 2020-10-02 LAB — COMPREHENSIVE METABOLIC PANEL
ALT: <5 U/L (ref 0–44)
AST: 16 U/L (ref 15–41)
Albumin: 3.9 g/dL (ref 3.5–5.0)
Alkaline Phosphatase: 97 U/L (ref 38–126)
Anion gap: 11 (ref 5–15)
BUN: 24 mg/dL — ABNORMAL HIGH (ref 8–23)
CO2: 22 mmol/L (ref 22–32)
Calcium: 8.7 mg/dL — ABNORMAL LOW (ref 8.9–10.3)
Chloride: 107 mmol/L (ref 98–111)
Creatinine, Ser: 0.51 mg/dL (ref 0.44–1.00)
GFR, Estimated: >60 mL/min (ref >60)
Glucose, Bld: 99 mg/dL (ref 70–99)
Potassium: 3.6 mmol/L (ref 3.5–5.1)
Sodium: 140 mmol/L (ref 135–145)
Total Bilirubin: 0.8 mg/dL (ref 0.3–1.2)
Total Protein: 6.5 g/dL (ref 6.5–8.1)

## 2020-10-02 LAB — LACTIC ACID, PLASMA: Lactic Acid, Venous: 1.8 mmol/L (ref 0.5–1.9)

## 2020-10-02 MED ORDER — LACTATED RINGERS IV BOLUS
1000.0000 mL | Freq: Once | INTRAVENOUS | Status: AC
  Administered 2020-10-02: 1000 mL via INTRAVENOUS

## 2020-10-02 MED ORDER — LACTATED RINGERS IV BOLUS
500.0000 mL | Freq: Once | INTRAVENOUS | Status: AC
  Administered 2020-10-02: 500 mL via INTRAVENOUS

## 2020-10-02 NOTE — ED Provider Notes (Signed)
[facility] Emergency Department Provider Note   ____________________________________________   Event Date/Time   First MD Initiated Contact with Patient 10/02/20 2228     (approximate)  I have reviewed the triage vital signs and the nursing notes.   HISTORY  Chief Complaint Altered Mental Status  EM caveat: Somnolence, dementia  HPI Sarah Donaldson is a 70 y.o. female here for evaluation of agitated behavior  Patient evidently wandering because at her facility, was breaking items per EMS, was getting given a Xanax by the facility and EMS found her agitated medicating with two and half milligrams of Haldol  Patient is calm now, fairly sedate.  She denies any pain or concerns.  She recognizes her name and that she is at a hospital but cannot give me any further information and does not seem to recall an incident of behavioral outburst.  She denies chest pain or trouble breathing no headache.  She feels okay just tired.   Past Medical History:  Diagnosis Date  . Cancer ([place])    Non-hodgkin's lymphoma (in remission)  . GERD (gastroesophageal reflux disease)    well-controlled w/ omeprazole  . Hyperlipidemia   . Knee joint cyst, left 02/07/2018  . Parkinson disease ([place])   . Parkinson disease [facility])     Patient Active Problem List   Diagnosis Date Noted  . Essential hypertension 06/02/2020  . Need for influenza vaccination 06/02/2020  . Encounter for annual health check of caregiver 04/08/2020  . Altered mental status   . Dementia due to Parkinson's disease with behavioral disturbance ([place])   . SDH (subdural hematoma) ([place]) 12/11/2019  . Falls frequently 12/11/2019  . S/P total knee replacement, left 06/07/2019  . Osteoarthritis of knee 04/27/2019  . Panic attacks 04/07/2019  . Psychosis ([place]) 04/04/2019  . Numbness 03/09/2018  . Lumbar spondylosis 03/07/2018  . Chronic pain of left knee 03/07/2018  . Chronic upper back pain 12/08/2017  .  Benign neoplasm of hand 10/20/2017  . Chronic pain syndrome 10/20/2017  . Chronic pain of right lower extremity 10/20/2017  . Chronic myofascial pain 10/20/2017  . Numbness and tingling 07/12/2017  . Sleep difficulties 07/12/2017  . Low back pain 01/18/2017  . Sleep behavior disorder, REM 01/18/2017  . Lumbar radiculopathy 11/01/2016  . Follicular lymphoma of extranodal and solid organ sites [facility]) 05/01/2016  . Lymphoma ([place]) 04/30/2016  . Hallux limitus 07/27/2015  . Low back pain with sciatica 02/13/2015  . Bilateral low back pain with sciatica 02/13/2015  . H/O adenomatous polyp of colon 04/06/2014  . Hx of adenomatous colonic polyps 04/06/2014  . Back ache 01/25/2014  . Adiposity 01/25/2014  . REM sleep behavior disorder 01/25/2014  . Disordered sleep 01/25/2014  . Has a tremor 01/25/2014  . Obesity, unspecified 01/25/2014  . Sleep disorder 01/25/2014  . Backache 01/25/2014  . Tremor 01/25/2014  . Obesity 01/25/2014  . Difficulty hearing 06/08/2012  . Hearing loss 06/08/2012  . Anxiety disorder 06/06/2012  . Colon polyp 06/06/2012  . Clinical depression 06/06/2012  . Hypercholesteremia 06/06/2012  . Idiopathic Parkinson's disease ([place]) 06/06/2012  . Detached retina 06/06/2012  . Depressive disorder 06/06/2012  . Retinal detachment 06/06/2012  . Parkinson's disease ([place]) 06/06/2012    Past Surgical History:  Procedure Laterality Date  . BACK SURGERY    . BUNIONECTOMY    . NASAL SEPTUM SURGERY    . RETINAL DETACHMENT SURGERY    . TOTAL KNEE ARTHROPLASTY Left 06/07/2019   Procedure: TOTAL KNEE ARTHROPLASTY;  Surgeon: Lovell Sheehan, MD;  Location: [facility] ORS;  Service: Orthopedics;  Laterality: Left;    Prior to Admission medications   Medication Sig Start Date End Date Taking? Authorizing Provider  acetaminophen (TYLENOL) 650 MG CR tablet Take 650-1,300 mg by mouth in the morning and at bedtime.    [provider]  ALPRAZolam Duanne Moron) 0.25 MG tablet Take  0.25-0.5 mg by mouth daily as needed for anxiety.    [provider]  carbidopa-levodopa (SINEMET IR) 25-100 MG tablet Take 2 tablets by mouth every 2 (two) hours. (between the hours of 8AM and 8PM)    [provider]  clonazePAM (KLONOPIN) 2 MG tablet Take 1 mg by mouth 4 (four) times daily. 04/03/20   [provider]  clotrimazole-betamethasone (LOTRISONE) cream Apply 1 application topically 2 (two) times daily. Patient not taking: Reported on 09/14/2020 07/16/20   Edrick Kins, DPM  entacapone (COMTAN) 200 MG tablet Take 200 mg by mouth 6 (six) times daily. (May take 1 additional tablet (200mg ) by mouth at 8PM if needed)    [provider]  escitalopram (LEXAPRO) 10 MG tablet Take 10 mg by mouth daily.    [provider]  HYDROcodone-acetaminophen (NORCO/VICODIN) 5-325 MG tablet Take 1 tablet by mouth 3 (three) times daily as needed for moderate pain.    [provider]  pregabalin (LYRICA) 100 MG capsule Take 100 mg by mouth 3 (three) times daily. 11/18/19   [provider]  QUEtiapine (SEROQUEL) 100 MG tablet Take 1 tablet (100 mg total) by mouth at bedtime. 08/08/20   Clapacs, Madie Reno, MD  QUEtiapine (SEROQUEL) 50 MG tablet Take 1 tablet (50 mg total) by mouth 3 (three) times daily. 08/08/20   Clapacs, Madie Reno, MD  traZODone (DESYREL) 50 MG tablet Take 50 mg by mouth at bedtime.    [provider]    Allergies Percocet [oxycodone-acetaminophen], Baclofen, Codeine sulfate, Gabapentin, Terazosin, and Tramadol hcl  Family History  Problem Relation Age of Onset  . Heart disease Mother   . Alcohol abuse Father   . Breast cancer Neg Hx   . Mental illness Neg Hx     Social History Social History   Tobacco Use  . Smoking status: Former Research scientist (life sciences)  . Smokeless tobacco: Never Used  Vaping Use  . Vaping Use: Never used  Substance Use Topics  . Alcohol use: Yes    Alcohol/week: 0.0 standard drinks  . Drug use: No     Review of Systems -some caveat has review of systems felt somewhat unreliable given the patient's dementia      ____________________________________________   PHYSICAL EXAM:  VITAL SIGNS: ED Triage Vitals [10/02/20 2205]  Enc Vitals Group     BP (!) 95/58     Pulse Rate 86     Resp 20     Temp 98.1 F (36.7 C)     Temp Source Oral     SpO2 95 %     Weight      Height      Head Circumference      Peak Flow      Pain Score      Pain Loc      Pain Edu?      Excl. in [place]?     Constitutional: Alert and oriented to self and being in a hospital but not to year or situation preceding.  Somewhat fatigued slightly somnolent and frail in appearance. Eyes: Conjunctivae are normal. Head: Atraumatic. Nose: No  congestion/rhinnorhea. Mouth/Throat: Mucous membranes are moderately dry. Neck: No stridor.  Cardiovascular: Normal rate, regular rhythm. Grossly normal heart sounds.  Good peripheral circulation. Respiratory: Normal respiratory effort.  No retractions. Lungs CTAB. Gastrointestinal: Soft and nontender. No distention. Musculoskeletal: No lower extremity tenderness nor edema.  She has generalized about 4-5 strength in all extremities.  Slow muscular movements. Neurologic:  Normal language but soft-spoken voice. No gross focal neurologic deficits are appreciated.  Parkinsonian-like face, but even movements and no obvious deficits.  Moves all extremities to command. Skin:  Skin is warm, dry and intact. No rash noted. Psychiatric: Mood and affect are calm, slightly sedate but able to maintain alertness for discussion and interaction. ____________________________________________   LABS (all labs ordered are listed, but only abnormal results are displayed)  Labs Reviewed  CBC - Abnormal; Notable for the following components:      Result Value   Hemoglobin 11.8 (*)    All other components within normal limits  COMPREHENSIVE METABOLIC PANEL - Abnormal; Notable for the  following components:   BUN 24 (*)    Calcium 8.7 (*)    All other components within normal limits  RESP PANEL BY RT-PCR (FLU A&B, COVID) ARPGX2  LACTIC ACID, PLASMA  URINALYSIS, COMPLETE (UACMP) WITH MICROSCOPIC   ____________________________________________  EKG  Reviewed entered by me at 2240 Heart rate 75 QRs 100 QTc 450 Normal sinus no evidence of ischemia ____________________________________________  RADIOLOGY  DG Chest Portable 1 View  Result Date: 10/02/2020 CLINICAL DATA:  Altered mental status. EXAM: PORTABLE CHEST 1 VIEW COMPARISON:  September 20, 2020 FINDINGS: Mild, chronic appearing increased lung markings are seen with mild linear scarring and/or atelectasis is seen within the left lung base. There is no evidence of a pleural effusion or pneumothorax. The heart size and mediastinal contours are within normal limits. Degenerative changes seen throughout the thoracic spine. Bilateral radiopaque pedicle screws are seen within the visualized portion of the lumbar spine the. IMPRESSION: Mild left basilar linear scarring and/or atelectasis. Electronically Signed   By: Virgina Norfolk M.D.   On: 10/02/2020 22:47    chest x-ray negative for acute process.  Probable scarring or atelectasis left base  I do not see evidence for acute neurologic or head CT type imaging, patient has a known history of behavioral disturbances this presentation seems fairly consistent with same.  There is no evidence of trauma or report of injury.  She has no focal neurologic deficits   ____________________________________________   PROCEDURES  Procedure(s) performed: None  Procedures  Critical Care performed: No  ____________________________________________   INITIAL IMPRESSION / ASSESSMENT AND PLAN / ED COURSE  Pertinent labs & imaging results that were available during my care of the patient were reviewed by me and considered in my medical decision making (see chart for details).    Patient has calmed considerably, and she is compliant with history taking and staff now.  She has a history of agitation and combative type behaviors likely driven by her Parkinson's and some dementia.  She does not recall the event earlier, and was given Xanax and Haldol prior to arrival here  Presently she is calm, noted to have some mild to moderate hypotension, she does appear dry by exam but labs do not demonstrate AKI or elevated creatinine.  Will hydrate, observe closely, also check urinalysis chest x-ray evaluate for evidence of possible infection or acute laboratory abnormalities that may be driving her presentation Most suspect likely ongoing issues related to her Parkinson's and dementia.  Ongoing care signed to Dr. Tamala Julian.  The patient is not under IVC, she does not appear to be significant risk to herself or others at this time.  She is calm compliant and does not seem to have any recollection of even becoming agitated the facility earlier.  Follow-up on blood pressures, response of fluids, urinalysis pending.  Psych consult pending      ____________________________________________   FINAL CLINICAL IMPRESSION(S) / ED DIAGNOSES  Final diagnoses:  Dementia due to Parkinson's disease with behavioral disturbance ([place])        Note:  This document was prepared using Dragon voice recognition software and may include unintentional dictation errors       Delman Kitten, MD 10/02/20 2338

## 2020-10-02 NOTE — ED Triage Notes (Signed)
Pt arrives from [facility] [place] re: acute onset confusion with aggressive behavior --pt reportedly had been wandering halls at facility breaking items to include lamps-- pt unresponsive to  Xanax given at facility -- enroute ems administer 2.5 mg IM Haldol; ems reports pt now much calmer than on scene - h/o dementia at baseline

## 2020-10-03 DIAGNOSIS — G20 Parkinson's disease: Secondary | ICD-10-CM

## 2020-10-03 DIAGNOSIS — F0281 Dementia in other diseases classified elsewhere with behavioral disturbance: Secondary | ICD-10-CM

## 2020-10-03 DIAGNOSIS — G3183 Dementia with Lewy bodies: Secondary | ICD-10-CM | POA: Diagnosis not present

## 2020-10-03 MED ORDER — LACTATED RINGERS IV BOLUS
500.0000 mL | Freq: Once | INTRAVENOUS | Status: AC
  Administered 2020-10-03: 500 mL via INTRAVENOUS

## 2020-10-03 NOTE — Discharge Instructions (Addendum)
For now, continue Klonopin but DO NOT continue to give additional Xanax. This is likely contributing to Ms. Ledet's behavioral issues.   Follow-up with facility MD in a week  Return to ER as needed

## 2020-10-03 NOTE — ED Notes (Signed)
Dr Charlsie Quest notified of cardiac rhythm changes from SR 60s to SB 40s-50s;verbal order for EKG

## 2020-10-03 NOTE — ED Notes (Signed)
Dr.Clapacs at bedside  

## 2020-10-03 NOTE — ED Notes (Signed)
Pt continues to rest with eyes closed; RR even and unlabored on RA with symmetrical rise and fall of chest- no acute distress noted.  Pt remains on continuous cardiac and pulse ox monitoring-- no new runs of V-Tach noted; Vitals stable.  Will continue to monitor for acute changes and maintain plan of care.

## 2020-10-03 NOTE — ED Notes (Addendum)
Pt now awake and alert. Pt alert to self and time and disoriented to situation. MD made aware that pt is awake. Pt is calm and cooperative at this time. VSS

## 2020-10-03 NOTE — ED Provider Notes (Signed)
Emergency Medicine Observation Re-evaluation Note  Sarah Donaldson is a 70 y.o. female, seen on rounds today.  Pt initially presented to the ED for complaints of Altered Mental Status Currently, the patient is sleeping comfortably.  Physical Exam  BP 121/65   Pulse (!) 51   Temp 98.1 F (36.7 C) (Oral)   Resp 10   SpO2 98%  Physical Exam Constitutional:      Appearance: She is not ill-appearing or toxic-appearing.  HENT:     Head: Atraumatic.  Cardiovascular:     Comments: Well perfused Pulmonary:     Effort: Pulmonary effort is normal.  Abdominal:     General: There is no distension.  Musculoskeletal:        General: No deformity.  Skin:    Findings: No rash.  Neurological:     General: No focal deficit present.     Cranial Nerves: No cranial nerve deficit.      ED Course / MDM  EKG:    I have reviewed the labs performed to date as well as medications administered while in observation.  Recent changes in the last 24 hours include Soft blood pressures resolved with IV fluids.  Attempted psychiatric NP evaluation, but patient was too sleepy to participate fully.  A single 4 beat run of V. tach noted.  Plan  Current plan is for repeat psychiatric evaluation this morning. Patient is not under full IVC at this time.   Vladimir Crofts, MD 10/03/20 [phone]

## 2020-10-03 NOTE — ED Notes (Signed)
Dr Charlsie Quest notified of ongoing hypotension -- current bp 87/48 (MAP 60) -- provider to bedside to assess pt who continues to rest with eyes closed-- pt aroused with tactile stimulation but is lethargic upon arousal and unable to maintain arousal for extended period -- provider repositioned bp cuff and upon recheck of bp patient is now normative at 102/53 (MAP 68)  -- verbal order to administer additional 500cc LR to keep MAP >65 from Dr Charlsie Quest at this time and continue to monitor for acute changes

## 2020-10-03 NOTE — BH Assessment (Signed)
Writer was unable to complete consult. Patient was unable to participate in the interview.

## 2020-10-03 NOTE — ED Provider Notes (Signed)
Patient cleared by psychiatry. Will hold Xanax PRN at the facility as this may be contributing. Safe to return per Psychiatry. Medically stable.   Duffy Bruce, MD 10/03/20 [phone]

## 2020-10-03 NOTE — ED Notes (Signed)
Report given to the [facility]. Facility made aware pt's husband will be taking her back to facility upon discharge.

## 2020-10-03 NOTE — Consult Note (Signed)
[facility] Consult   Reason for Consult: Consult for 70 year old woman brought here from assisted living facility because of agitated behavior Referring Physician: Joni Donaldson Patient Identification: Sarah Donaldson MRN:  [id number] Principal Diagnosis: Dementia due to Parkinson's disease with behavioral disturbance (Sarah Donaldson) Diagnosis:  Principal Problem:   Dementia due to Parkinson's disease with behavioral disturbance (Sarah Donaldson) Active Problems:   Anxiety disorder   Altered mental status   Total Time spent with patient: 1 hour  Subjective:   Sarah Donaldson is a 70 y.o. female patient admitted with "I do not remember".  HPI: Patient seen chart reviewed.  70 year old woman with a history of Parkinson's disease and dementia related to Parkinson's disease who is currently at an assisted living facility.  Brought here voluntarily with reports that she had become agitated at the group home or assisted living facility.  Not clear that there was any specific allegation of violence or aggression.  Difficult to however reportedly to redirect and became more agitated and did not respond to attempts to calm her with Xanax.  Patient had been sleeping for many hours in the emergency room before I was able to evaluate her.  When she did wake up she was initially confused.  Did not know where she was.  Did not know the year or month.  Ultimately she was able to remember that people have been concerned about her being agitated but she remembers no details about it.  She denied any suicidal or homicidal thought or aggression.  Denied that she was having any hallucinations.  Patient was calm and cooperative with procedure here in the emergency room.  Did not seem to be actively psychotic certainly not violent or aggressive.  Medically appears to be stable with no clear new medical problems that required hospital level treatment  Past Psychiatric History: History of agitation with probable dementia  related to Parkinson's disease which had been improved with Seroquel.  Also longer history of chronic anxiety on clonazepam  Risk to Self:   Risk to Others:   Prior Inpatient Therapy:   Prior Outpatient Therapy:    Past Medical History:  Past Medical History:  Diagnosis Date  . Cancer ([place])    Non-hodgkin's lymphoma (in remission)  . GERD (gastroesophageal reflux disease)    well-controlled w/ omeprazole  . Hyperlipidemia   . Knee joint cyst, left 02/07/2018  . Parkinson disease ([place])   . Parkinson disease [facility])     Past Surgical History:  Procedure Laterality Date  . BACK SURGERY    . BUNIONECTOMY    . NASAL SEPTUM SURGERY    . RETINAL DETACHMENT SURGERY    . TOTAL KNEE ARTHROPLASTY Left 06/07/2019   Procedure: TOTAL KNEE ARTHROPLASTY;  Surgeon: Lovell Sheehan, MD;  Location: [facility] ORS;  Service: Orthopedics;  Laterality: Left;   Family History:  Family History  Problem Relation Age of Onset  . Heart disease Mother   . Alcohol abuse Father   . Breast cancer Neg Hx   . Mental illness Neg Hx    Family Psychiatric  History: See previous Social History:  Social History   Substance and Sexual Activity  Alcohol Use Yes  . Alcohol/week: 0.0 standard drinks     Social History   Substance and Sexual Activity  Drug Use No    Social History   Socioeconomic History  . Marital status: Married    Spouse name: Sarah Donaldson  . Number of children: 4  . Years of education: Not on  file  . Highest education level: Not on file  Occupational History    Comment: retired  Tobacco Use  . Smoking status: Former Research scientist (life sciences)  . Smokeless tobacco: Never Used  Vaping Use  . Vaping Use: Never used  Substance and Sexual Activity  . Alcohol use: Yes    Alcohol/week: 0.0 standard drinks  . Drug use: No  . Sexual activity: Not Currently  Other Topics Concern  . Not on file  Social History Narrative  . Not on file   Social Determinants of Health   Financial Resource Strain: Not on  file  Food Insecurity: Not on file  Transportation Needs: Not on file  Physical Activity: Not on file  Stress: Not on file  Social Connections: Not on file   Additional Social History:    Allergies:   Allergies  Allergen Reactions  . Percocet [Oxycodone-Acetaminophen]     Hallucination  . Baclofen     Caused numbness in mouth   . Codeine Sulfate Nausea And Vomiting  . Gabapentin Swelling    Mouth swelling   . Terazosin   . Tramadol Hcl     Interferes with Serotonin levels which affects her Parkinsons    Labs:  Results for orders placed or performed during the hospital encounter of 10/02/20 (from the past 48 hour(s))  CBC     Status: Abnormal   Collection Time: 10/02/20 10:14 PM  Result Value Ref Range   WBC 6.8 4.0 - 10.5 K/uL   RBC 4.29 3.87 - 5.11 MIL/uL   Hemoglobin 11.8 (L) 12.0 - 15.0 g/dL   HCT 37.7 36.0 - 46.0 %   MCV 87.9 80.0 - 100.0 fL   MCH 27.5 26.0 - 34.0 pg   MCHC 31.3 30.0 - 36.0 g/dL   RDW 14.7 11.5 - 15.5 %   Platelets 182 150 - 400 K/uL   nRBC 0.0 0.0 - 0.2 %    Comment: Performed at [facility], [address] [place], [place] [address]  Comprehensive metabolic panel     Status: Abnormal   Collection Time: 10/02/20 10:14 PM  Result Value Ref Range   Sodium 140 135 - 145 mmol/L   Potassium 3.6 3.5 - 5.1 mmol/L   Chloride 107 98 - 111 mmol/L   CO2 22 22 - 32 mmol/L   Glucose, Bld 99 70 - 99 mg/dL    Comment: Glucose reference range applies only to samples taken after fasting for at least 8 hours.   BUN 24 (H) 8 - 23 mg/dL   Creatinine, Ser 0.51 0.44 - 1.00 mg/dL   Calcium 8.7 (L) 8.9 - 10.3 mg/dL   Total Protein 6.5 6.5 - 8.1 g/dL   Albumin 3.9 3.5 - 5.0 g/dL   AST 16 15 - 41 U/L   ALT <5 0 - 44 U/L   Alkaline Phosphatase 97 38 - 126 U/L   Total Bilirubin 0.8 0.3 - 1.2 mg/dL   GFR, Estimated >60 >60 mL/min    Comment: (NOTE) Calculated using the CKD-EPI Creatinine Equation (2021)    Anion gap 11 5 - 15    Comment: Performed  at [facility], [address] [place], [place] [address]  Lactic acid, plasma     Status: None   Collection Time: 10/02/20 10:44 PM  Result Value Ref Range   Lactic Acid, Venous 1.8 0.5 - 1.9 mmol/L    Comment: Performed at [facility], [address] [place],  [address]  Urinalysis, Complete w  Microscopic Urine, Catheterized     Status: Abnormal   Collection Time: 10/02/20 10:44 PM  Result Value Ref Range   Color, Urine AMBER (A) YELLOW    Comment: BIOCHEMICALS MAY BE AFFECTED BY COLOR   APPearance HAZY (A) CLEAR   Specific Gravity, Urine 1.031 (H) 1.005 - 1.030   pH 5.0 5.0 - 8.0   Glucose, UA NEGATIVE NEGATIVE mg/dL   Hgb urine dipstick NEGATIVE NEGATIVE   Bilirubin Urine NEGATIVE NEGATIVE   Ketones, ur 20 (A) NEGATIVE mg/dL   Protein, ur NEGATIVE NEGATIVE mg/dL   Nitrite NEGATIVE NEGATIVE   Leukocytes,Ua NEGATIVE NEGATIVE   RBC / HPF 0-5 0 - 5 RBC/hpf   WBC, UA 0-5 0 - 5 WBC/hpf   Bacteria, UA NONE SEEN NONE SEEN   Squamous Epithelial / LPF 0-5 0 - 5   Mucus PRESENT    Ca Oxalate Crys, UA PRESENT     Comment: Performed at [facility], [address], [place], [place] [address]  Resp Panel by RT-PCR (Flu A&B, Covid) Nasopharyngeal Swab     Status: None   Collection Time: 10/02/20 10:44 PM   Specimen: Nasopharyngeal Swab; Nasopharyngeal(NP) swabs in vial transport medium  Result Value Ref Range   SARS Coronavirus 2 by RT PCR NEGATIVE NEGATIVE    Comment: (NOTE) SARS-CoV-2 target nucleic acids are NOT DETECTED.  The SARS-CoV-2 RNA is generally detectable in upper respiratory specimens during the acute phase of infection. The lowest concentration of SARS-CoV-2 viral copies this assay can detect is 138 copies/mL. A negative result does not preclude SARS-Cov-2 infection and should not be used as the sole basis for treatment or other patient management decisions. A negative result may occur with  improper specimen collection/handling,  submission of specimen other than nasopharyngeal swab, presence of viral mutation(s) within the areas targeted by this assay, and inadequate number of viral copies(<138 copies/mL). A negative result must be combined with clinical observations, patient history, and epidemiological information. The expected result is Negative.  Fact Sheet for Patients:  EntrepreneurPulse.com.au  Fact Sheet for Healthcare Providers:  IncredibleEmployment.be  This test is no t yet approved or cleared by the [place] FDA and  has been authorized for detection and/or diagnosis of SARS-CoV-2 by FDA under an Emergency Use Authorization (EUA). This EUA will remain  in effect (meaning this test can be used) for the duration of the COVID-19 declaration under Section 564(b)(1) of the Act, 21 U.S.C.section 360bbb-3(b)(1), unless the authorization is terminated  or revoked sooner.       Influenza A by PCR NEGATIVE NEGATIVE   Influenza B by PCR NEGATIVE NEGATIVE    Comment: (NOTE) The Xpert Xpress SARS-CoV-2/FLU/RSV plus assay is intended as an aid in the diagnosis of influenza from Nasopharyngeal swab specimens and should not be used as a sole basis for treatment. Nasal washings and aspirates are unacceptable for Xpert Xpress SARS-CoV-2/FLU/RSV testing.  Fact Sheet for Patients: EntrepreneurPulse.com.au  Fact Sheet for Healthcare Providers: IncredibleEmployment.be  This test is not yet approved or cleared by the [place] FDA and has been authorized for detection and/or diagnosis of SARS-CoV-2 by FDA under an Emergency Use Authorization (EUA). This EUA will remain in effect (meaning this test can be used) for the duration of the COVID-19 declaration under Section 564(b)(1) of the Act, 21 U.S.C. section 360bbb-3(b)(1), unless the authorization is terminated or revoked.  Performed at [facility], [address], [place],  [address]     No current facility-administered medications  for this encounter.   Current Outpatient Medications  Medication Sig Dispense Refill  . carbidopa-levodopa (SINEMET IR) 25-100 MG tablet Take 2 tablets by mouth every 2 (two) hours. (between the hours of 8AM and 8PM)    . clonazePAM (KLONOPIN) 2 MG tablet Take 1 mg by mouth 4 (four) times daily.    . entacapone (COMTAN) 200 MG tablet Take 200 mg by mouth 4 (four) times daily.    Marland Kitchen escitalopram (LEXAPRO) 10 MG tablet Take 10 mg by mouth daily.    Marland Kitchen HYDROcodone-acetaminophen (NORCO/VICODIN) 5-325 MG tablet Take 1 tablet by mouth 2 (two) times daily. (take at lunch and bedtime)    . omeprazole (PRILOSEC) 10 MG capsule Take 10 mg by mouth daily.    . potassium chloride SA (KLOR-CON) 20 MEQ tablet Take 20 mEq by mouth daily.    . prednisoLONE acetate (PRED FORTE) 1 % ophthalmic suspension Place 1 drop into the left eye daily.    . pregabalin (LYRICA) 100 MG capsule Take 100 mg by mouth 3 (three) times daily.    . QUEtiapine (SEROQUEL) 100 MG tablet Take 1 tablet (100 mg total) by mouth at bedtime. 30 tablet 1  . QUEtiapine (SEROQUEL) 50 MG tablet Take 1 tablet (50 mg total) by mouth 3 (three) times daily. 90 tablet 1  . traZODone (DESYREL) 50 MG tablet Take 50 mg by mouth at bedtime.      Musculoskeletal: Strength & Muscle Tone: decreased Gait & Station: unsteady Patient leans: N/A  Psychiatric Specialty Exam: Physical Exam Vitals and nursing note reviewed.  Constitutional:      Appearance: She is well-developed and well-nourished.  HENT:     Head: Normocephalic and atraumatic.  Eyes:     Conjunctiva/sclera: Conjunctivae normal.     Pupils: Pupils are equal, round, and reactive to light.  Cardiovascular:     Heart sounds: Normal heart sounds.  Pulmonary:     Effort: Pulmonary effort is normal.  Abdominal:     Palpations: Abdomen is soft.  Musculoskeletal:        General: Normal range of motion.      Cervical back: Normal range of motion.  Skin:    General: Skin is warm and dry.  Neurological:     General: No focal deficit present.     Mental Status: She is alert.  Psychiatric:        Attention and Perception: She is inattentive.        Mood and Affect: Mood normal.        Speech: Speech is delayed.        Behavior: Behavior is slowed.        Thought Content: Thought content is not paranoid or delusional. Thought content does not include homicidal or suicidal ideation.        Cognition and Memory: Cognition is impaired. Memory is impaired.        Judgment: Judgment normal.     Review of Systems  Constitutional: Negative.   HENT: Negative.   Eyes: Negative.   Respiratory: Negative.   Cardiovascular: Negative.   Gastrointestinal: Negative.   Musculoskeletal: Negative.   Skin: Negative.   Neurological: Negative.   Psychiatric/Behavioral: Negative.     Blood pressure (!) 147/77, pulse 64, temperature 98.1 F (36.7 C), temperature source Oral, resp. rate 18, SpO2 99 %.There is no height or weight on file to calculate BMI.  General Appearance: Casual  Eye Contact:  Fair  Speech:  Slow  Volume:  Decreased  Mood:  Euthymic  Affect:  Constricted  Thought Process:  Coherent  Orientation:  Negative  Thought Content:  Negative  Suicidal Thoughts:  No  Homicidal Thoughts:  No  Memory:  Immediate;   Fair Recent;   Poor Remote;   Fair  Judgement:  Impaired  Insight:  Shallow  Psychomotor Activity:  Decreased  Concentration:  Concentration: Poor  Recall:  Poor  Fund of Knowledge:  Fair  Language:  Fair  Akathisia:  No  Handed:  Right  AIMS (if indicated):     Assets:  Desire for Improvement Housing Resilience Social Support  ADL's:  Impaired  Cognition:  Impaired,  Mild and Moderate  Sleep:        Treatment Plan Summary: Plan Patient with a long history of chronic anxiety now with dementia and some agitation.  Medical exam is unremarkable.  No sign of infection or  urinary tract infection.  Patient is currently calm without any threats or aggressive behavior.  Does not meet criteria for hospital level treatment.  I suspect that the addition of alprazolam to her baseline clonazepam was not a good choice for managing agitation and would recommend they reconsider possibly adding more Seroquel in the future.  No new prescriptions written no change to current treatment plan.  Patient can be discharged back to her assisted living facility Case reviewed with emergency room physician  Disposition: Patient does not meet criteria for psychiatric inpatient admission. Supportive therapy provided about ongoing stressors.  Alethia Berthold, MD 10/03/2020 7:01 PM

## 2020-10-03 NOTE — ED Notes (Addendum)
Dr Charlsie Quest notified of patient 5 beat run V-Tach; no new orders at this time - pt continues to rest with eyes closed; RR even and unlabored on RA  - continuous cardiac and pulse ox monitoring maintained

## 2020-10-03 NOTE — ED Notes (Signed)
Dr Charlsie Quest has reviewed EKG -- pt remains SB; no new orders at this time per provider as pt is asymptomatic - continues to rest with eyes closed; RR even and unlabored on RA

## 2020-10-03 NOTE — BH Assessment (Signed)
TTS and Kennyth Lose, NP attempted to wake patient for assessment. Patient could not be aroused. Patient will be interviewed at a later time.

## 2020-10-08 ENCOUNTER — Emergency Department
Admission: EM | Admit: 2020-10-08 | Discharge: 2020-10-08 | Disposition: A | Discharge status: Home / Self Care | Payer: Medicare Other | Attending: Emergency Medicine | Admitting: Emergency Medicine

## 2020-10-08 ENCOUNTER — Emergency Department: Payer: Medicare Other

## 2020-10-08 ENCOUNTER — Other Ambulatory Visit: Payer: Self-pay

## 2020-10-08 DIAGNOSIS — Z79899 Other long term (current) drug therapy: Secondary | ICD-10-CM | POA: Diagnosis not present

## 2020-10-08 DIAGNOSIS — Z046 Encounter for general psychiatric examination, requested by authority: Secondary | ICD-10-CM | POA: Diagnosis present

## 2020-10-08 DIAGNOSIS — G20 Parkinson's disease: Secondary | ICD-10-CM | POA: Diagnosis not present

## 2020-10-08 DIAGNOSIS — Z87891 Personal history of nicotine dependence: Secondary | ICD-10-CM | POA: Insufficient documentation

## 2020-10-08 DIAGNOSIS — I10 Essential (primary) hypertension: Secondary | ICD-10-CM | POA: Diagnosis not present

## 2020-10-08 DIAGNOSIS — Z8572 Personal history of non-Hodgkin lymphomas: Secondary | ICD-10-CM | POA: Diagnosis not present

## 2020-10-08 DIAGNOSIS — F29 Unspecified psychosis not due to a substance or known physiological condition: Secondary | ICD-10-CM | POA: Diagnosis not present

## 2020-10-08 DIAGNOSIS — F0390 Unspecified dementia without behavioral disturbance: Secondary | ICD-10-CM | POA: Diagnosis not present

## 2020-10-08 DIAGNOSIS — Z20822 Contact with and (suspected) exposure to covid-19: Secondary | ICD-10-CM | POA: Diagnosis not present

## 2020-10-08 LAB — URINALYSIS, COMPLETE (UACMP) WITH MICROSCOPIC
Bilirubin Urine: NEGATIVE
Glucose, UA: NEGATIVE mg/dL
Hgb urine dipstick: NEGATIVE
Ketones, ur: 20 mg/dL — AB
Nitrite: NEGATIVE
Protein, ur: NEGATIVE mg/dL
Specific Gravity, Urine: 1.03 (ref 1.005–1.030)
Squamous Epithelial / HPF: NONE SEEN (ref 0–5)
pH: 5 (ref 5.0–8.0)

## 2020-10-08 LAB — COMPREHENSIVE METABOLIC PANEL
ALT: <5 U/L (ref 0–44)
AST: 15 U/L (ref 15–41)
Albumin: 3.9 g/dL (ref 3.5–5.0)
Alkaline Phosphatase: 85 U/L (ref 38–126)
Anion gap: 6 (ref 5–15)
BUN: 23 mg/dL (ref 8–23)
CO2: 25 mmol/L (ref 22–32)
Calcium: 8.8 mg/dL — ABNORMAL LOW (ref 8.9–10.3)
Chloride: 109 mmol/L (ref 98–111)
Creatinine, Ser: 0.52 mg/dL (ref 0.44–1.00)
GFR, Estimated: >60 mL/min (ref >60)
Glucose, Bld: 116 mg/dL — ABNORMAL HIGH (ref 70–99)
Potassium: 3.7 mmol/L (ref 3.5–5.1)
Sodium: 140 mmol/L (ref 135–145)
Total Bilirubin: 0.9 mg/dL (ref 0.3–1.2)
Total Protein: 6 g/dL — ABNORMAL LOW (ref 6.5–8.1)

## 2020-10-08 LAB — CBC WITH DIFFERENTIAL/PLATELET
Abs Immature Granulocytes: 0.02 10*3/uL (ref 0.00–0.07)
Basophils Absolute: 0 10*3/uL (ref 0.0–0.1)
Basophils Relative: 0 %
Eosinophils Absolute: 0.2 10*3/uL (ref 0.0–0.5)
Eosinophils Relative: 3 %
HCT: 37.3 % (ref 36.0–46.0)
Hemoglobin: 11.8 g/dL — ABNORMAL LOW (ref 12.0–15.0)
Immature Granulocytes: 0 %
Lymphocytes Relative: 19 %
Lymphs Abs: 1.1 10*3/uL (ref 0.7–4.0)
MCH: 27.5 pg (ref 26.0–34.0)
MCHC: 31.6 g/dL (ref 30.0–36.0)
MCV: 86.9 fL (ref 80.0–100.0)
Monocytes Absolute: 0.5 10*3/uL (ref 0.1–1.0)
Monocytes Relative: 9 %
Neutro Abs: 4 10*3/uL (ref 1.7–7.7)
Neutrophils Relative %: 69 %
Platelets: 180 10*3/uL (ref 150–400)
RBC: 4.29 MIL/uL (ref 3.87–5.11)
RDW: 14.6 % (ref 11.5–15.5)
WBC: 5.8 10*3/uL (ref 4.0–10.5)
nRBC: 0 % (ref 0.0–0.2)

## 2020-10-08 LAB — TSH: TSH: 1.569 u[IU]/mL (ref 0.350–4.500)

## 2020-10-08 LAB — RESP PANEL BY RT-PCR (FLU A&B, COVID) ARPGX2
Influenza A by PCR: NEGATIVE
Influenza B by PCR: NEGATIVE
SARS Coronavirus 2 by RT PCR: NEGATIVE

## 2020-10-08 LAB — AMMONIA: Ammonia: 24 umol/L (ref 9–35)

## 2020-10-08 MED ORDER — MIDAZOLAM HCL 2 MG/2ML IJ SOLN
2.0000 mg | Freq: Once | INTRAMUSCULAR | Status: AC
  Administered 2020-10-08: 2 mg via INTRAMUSCULAR

## 2020-10-08 MED ORDER — QUETIAPINE FUMARATE 25 MG PO TABS: 100.0000 mg | ORAL_TABLET | Freq: Every day | ORAL | Status: DC

## 2020-10-08 MED ORDER — ZIPRASIDONE MESYLATE 20 MG IM SOLR
10.0000 mg | Freq: Once | INTRAMUSCULAR | Status: AC
  Administered 2020-10-08: 10 mg via INTRAMUSCULAR
  Filled 2020-10-08: qty 20

## 2020-10-08 NOTE — ED Notes (Signed)
Patient discharged back to [facility] via EMS in [place]. Patient is stable in NAD. Report given to [place]. Patient belongings given to EMS. No issues.

## 2020-10-08 NOTE — ED Notes (Signed)
Hourly rounding reveals patient in room. No complaints, stable, in no acute distress. Q15 minute rounds and monitoring via Rover and Officer to continue.   

## 2020-10-08 NOTE — ED Notes (Signed)
Patient is screaming and crying out loud "Sarah Donaldson where is my husband". "I want to go home". EDP aware.

## 2020-10-08 NOTE — ED Provider Notes (Signed)
[facility] Emergency Department Provider Note  ____________________________________________   Event Date/Time   First MD Initiated Contact with Patient 10/08/20 1902     (approximate)  I have reviewed the triage vital signs and the nursing notes.   HISTORY  Chief Complaint Psychiatric Evaluation   HPI Sarah Donaldson is a 70 y.o. female with past medical history of HDL, GERD, non-Hodgkin's lymphoma in remission and Parkinson's disease with psychosis currently treated with olanzapine and benzos and nursing facility presents EMS for assessment of concern for acute on chronic confusion and some aggressive behavior.  Per EMS patient is not oriented at baseline secondary to her Parkinson's but did became altered and aggressive today trying to hit staff.  Patient did receive 2.5 mg of Haldol in route with EMS.  Patient is unable provide any history and arrival secondary to altered mental status.         Past Medical History:  Diagnosis Date  . Cancer ([place])    Non-hodgkin's lymphoma (in remission)  . GERD (gastroesophageal reflux disease)    well-controlled w/ omeprazole  . Hyperlipidemia   . Knee joint cyst, left 02/07/2018  . Parkinson disease ([place])   . Parkinson disease [facility])     Patient Active Problem List   Diagnosis Date Noted  . Essential hypertension 06/02/2020  . Need for influenza vaccination 06/02/2020  . Encounter for annual health check of caregiver 04/08/2020  . Altered mental status   . Dementia due to Parkinson's disease with behavioral disturbance ([place])   . SDH (subdural hematoma) ([place]) 12/11/2019  . Falls frequently 12/11/2019  . S/P total knee replacement, left 06/07/2019  . Osteoarthritis of knee 04/27/2019  . Panic attacks 04/07/2019  . Psychosis ([place]) 04/04/2019  . Numbness 03/09/2018  . Lumbar spondylosis 03/07/2018  . Chronic pain of left knee 03/07/2018  . Chronic upper back pain 12/08/2017  . Benign neoplasm of hand  10/20/2017  . Chronic pain syndrome 10/20/2017  . Chronic pain of right lower extremity 10/20/2017  . Chronic myofascial pain 10/20/2017  . Numbness and tingling 07/12/2017  . Sleep difficulties 07/12/2017  . Low back pain 01/18/2017  . Sleep behavior disorder, REM 01/18/2017  . Lumbar radiculopathy 11/01/2016  . Follicular lymphoma of extranodal and solid organ sites [facility]) 05/01/2016  . Lymphoma ([place]) 04/30/2016  . Hallux limitus 07/27/2015  . Low back pain with sciatica 02/13/2015  . Bilateral low back pain with sciatica 02/13/2015  . H/O adenomatous polyp of colon 04/06/2014  . Hx of adenomatous colonic polyps 04/06/2014  . Back ache 01/25/2014  . Adiposity 01/25/2014  . REM sleep behavior disorder 01/25/2014  . Disordered sleep 01/25/2014  . Has a tremor 01/25/2014  . Obesity, unspecified 01/25/2014  . Sleep disorder 01/25/2014  . Backache 01/25/2014  . Tremor 01/25/2014  . Obesity 01/25/2014  . Difficulty hearing 06/08/2012  . Hearing loss 06/08/2012  . Anxiety disorder 06/06/2012  . Colon polyp 06/06/2012  . Clinical depression 06/06/2012  . Hypercholesteremia 06/06/2012  . Idiopathic Parkinson's disease ([place]) 06/06/2012  . Detached retina 06/06/2012  . Depressive disorder 06/06/2012  . Retinal detachment 06/06/2012  . Parkinson's disease ([place]) 06/06/2012    Past Surgical History:  Procedure Laterality Date  . BACK SURGERY    . BUNIONECTOMY    . NASAL SEPTUM SURGERY    . RETINAL DETACHMENT SURGERY    . TOTAL KNEE ARTHROPLASTY Left 06/07/2019   Procedure: TOTAL KNEE ARTHROPLASTY;  Surgeon: Lovell Sheehan, MD;  Location: [facility] ORS;  Service: Orthopedics;  Laterality: Left;    Prior to Admission medications   Medication Sig Start Date End Date Taking? Authorizing Provider  carbidopa-levodopa (SINEMET IR) 25-100 MG tablet Take 2 tablets by mouth every 2 (two) hours. (between the hours of 8AM and 8PM)    [provider]  clonazePAM (KLONOPIN) 2 MG tablet  Take 1 mg by mouth 4 (four) times daily. 04/03/20   [provider]  entacapone (COMTAN) 200 MG tablet Take 200 mg by mouth 4 (four) times daily.    [provider]  escitalopram (LEXAPRO) 10 MG tablet Take 10 mg by mouth daily.    [provider]  HYDROcodone-acetaminophen (NORCO/VICODIN) 5-325 MG tablet Take 1 tablet by mouth 2 (two) times daily. (take at lunch and bedtime)    [provider]  omeprazole (PRILOSEC) 10 MG capsule Take 10 mg by mouth daily.    [provider]  potassium chloride SA (KLOR-CON) 20 MEQ tablet Take 20 mEq by mouth daily.    [provider]  prednisoLONE acetate (PRED FORTE) 1 % ophthalmic suspension Place 1 drop into the left eye daily.    [provider]  pregabalin (LYRICA) 100 MG capsule Take 100 mg by mouth 3 (three) times daily. 11/18/19   [provider]  QUEtiapine (SEROQUEL) 100 MG tablet Take 1 tablet (100 mg total) by mouth at bedtime. 08/08/20   Clapacs, Madie Reno, MD  QUEtiapine (SEROQUEL) 50 MG tablet Take 1 tablet (50 mg total) by mouth 3 (three) times daily. 08/08/20   Clapacs, Madie Reno, MD  traZODone (DESYREL) 50 MG tablet Take 50 mg by mouth at bedtime.    [provider]    Allergies Percocet [oxycodone-acetaminophen], Baclofen, Codeine sulfate, Gabapentin, Terazosin, and Tramadol hcl  Family History  Problem Relation Age of Onset  . Heart disease Mother   . Alcohol abuse Father   . Breast cancer Neg Hx   . Mental illness Neg Hx     Social History Social History   Tobacco Use  . Smoking status: Former Research scientist (life sciences)  . Smokeless tobacco: Never Used  Vaping Use  . Vaping Use: Never used  Substance Use Topics  . Alcohol use: Yes    Alcohol/week: 0.0 standard drinks  . Drug use: No    Review of Systems  Review of Systems  Unable to perform ROS: Mental status change      ____________________________________________   PHYSICAL EXAM:  VITAL SIGNS: ED Triage  Vitals  Enc Vitals Group     BP      Pulse      Resp      Temp      Temp src      SpO2      Weight      Height      Head Circumference      Peak Flow      Pain Score      Pain Loc      Pain Edu?      Excl. in [place]?    Vitals:   10/08/20 2013  BP: (!) 102/59  Pulse: 77  Resp: 18  SpO2: 90%   Physical Exam Vitals and nursing note reviewed.  HENT:     Head: Normocephalic and atraumatic.     Right Ear: External ear normal.     Left Ear: External ear normal.     Nose: Nose normal.     Mouth/Throat:     Mouth: Mucous membranes are dry.  Eyes:     Extraocular Movements: Extraocular movements intact.     Conjunctiva/sclera: Conjunctivae normal.     Pupils: Pupils are equal, round, and reactive to light.  Cardiovascular:     Rate and Rhythm: Normal rate and regular rhythm.  Pulmonary:     Effort: Pulmonary effort is normal. No respiratory distress.     Breath sounds: No wheezing or rales.  Abdominal:     General: There is no distension.     Tenderness: There is no abdominal tenderness.  Musculoskeletal:     Right lower leg: No edema.     Left lower leg: No edema.  Skin:    General: Skin is dry.     Capillary Refill: Capillary refill takes more than 3 seconds.  Neurological:     General: No focal deficit present.     Mental Status: She is alert. She is disoriented and confused.  Psychiatric:        Behavior: Behavior is agitated, hyperactive and combative.      ____________________________________________   LABS (all labs ordered are listed, but only abnormal results are displayed)  Labs Reviewed  CBC WITH DIFFERENTIAL/PLATELET - Abnormal; Notable for the following components:      Result Value   Hemoglobin 11.8 (*)    All other components within normal limits  COMPREHENSIVE METABOLIC PANEL - Abnormal; Notable for the following components:   Glucose, Bld 116 (*)    Calcium 8.8 (*)    Total Protein 6.0 (*)    All other components within normal limits   URINALYSIS, COMPLETE (UACMP) WITH MICROSCOPIC - Abnormal; Notable for the following components:   Color, Urine AMBER (*)    APPearance CLEAR (*)    Ketones, ur 20 (*)    Leukocytes,Ua TRACE (*)    Bacteria, UA RARE (*)    All other components within normal limits  RESP PANEL BY RT-PCR (FLU A&B, COVID) ARPGX2  TSH  AMMONIA   ____________________________________________  EKG  ____________________________________________  RADIOLOGY  ED MD interpretation: CT head shows atrophy and small vessel ischemic changes with no acute findings.  No evidence of SAH or subacute CVA.  Chest x-ray shows no pneumothorax, effusion, edema, focal consolidation or other acute intrathoracic process.  Official radiology report(s): DG Chest 1 View  Result Date: 10/08/2020 CLINICAL DATA:  Hypoxia, agitated EXAM: CHEST  1 VIEW COMPARISON:  10/02/2020 FINDINGS: 2 frontal views of the chest demonstrate an unremarkable cardiac silhouette. No airspace disease, effusion, or pneumothorax. No acute bony abnormalities. IMPRESSION: 1. No acute intrathoracic process. Electronically Signed   By: Randa Ngo M.D.   On: 10/08/2020 21:05   CT Head Wo Contrast  Result Date: 10/08/2020 CLINICAL DATA:  Mental status change agitated EXAM: CT HEAD WITHOUT CONTRAST TECHNIQUE: Contiguous axial images were obtained from the base of the skull through the vertex without intravenous contrast. COMPARISON:  CT brain 09/25/2020, 09/20/2020 FINDINGS: Brain: No acute territorial infarction, hemorrhage, or intracranial mass. Mild atrophy. Minimal white matter hypodensity consistent with chronic small vessel ischemic change. Stable ventricle size. Vascular: No hyperdense vessels.  No unexpected calcification Skull: Normal. Negative for fracture or focal lesion. Sinuses/Orbits: No acute finding. Other: None IMPRESSION: 1. No CT evidence for acute intracranial abnormality. 2. Atrophy and minimal small vessel ischemic changes of the white matter.  Electronically Signed   By: Donavan Foil M.D.   On: 10/08/2020 20:18    ____________________________________________   PROCEDURES  Procedure(s) performed (including Critical Care):  Procedures   ____________________________________________  INITIAL IMPRESSION / ASSESSMENT AND PLAN / ED COURSE      Patient presents via EMS from the [facility] for assessment of some agitation at facility.  This in the setting of fairly advanced Parkinson's dementia.  Of note patient was seen for a similar concerns on 2/24 and discharged back to facility with recommendations to consider possibly increasing Seroquel but no other medication management for medical work-up was unremarkable.  On arrival today patient is afebrile and hemodynamically stable.  She is screaming appears to be delirious versus undergoing Parkinson psychosis.  No exam findings to suggest acute traumatic injury.  Very low suspicion for toxic ingestion.  We will plan to assess for UTI, metabolic derangements and acute intracranial abnormalities.  If this work-up is reassuring I suspect this is likely Parkinson's associated psychosis.  CT head shows no evidence of acute intracranial normality.  CBC shows no leukocytosis and hemoglobin at baseline CMP shows no significant electrolyte or metabolic derangements.  TSH is unremarkable and ammonia is within normal limits and not consistent with liver failure.  UA has no evidence of infection.  Patient treated with below noted calming agents on arrival on reassessment she stated she felt a little better and was much calmer.  While initially her SPO2 did decrease to 90% immediately after receiving calming agents on reassessment increase back up to 93%.  Given otherwise reassuring cardiac exam and chest x-ray suspect this is likely secondary to calling medications and do not believe this requires further work-up at this time.  Impression is likely psychosis.  Given she is now calm and there  is no clear acute contributing organic process I believe she is safe for discharge back to her nursing facility for continued care and management of her Parkinson's disease.  Discharged in stable condition.       ____________________________________________   FINAL CLINICAL IMPRESSION(S) / ED DIAGNOSES  Final diagnoses:  Psychosis due to Parkinson's disease ([place])    Medications  QUEtiapine (SEROQUEL) tablet 100 mg (100 mg Oral Not Given 10/08/20 2119)  midazolam (VERSED) injection 2 mg (2 mg Intramuscular Given 10/08/20 1902)  ziprasidone (GEODON) injection 10 mg (10 mg Intramuscular Given 10/08/20 1924)     ED Discharge Orders    None       Note:  This document was prepared using Dragon voice recognition software and may include unintentional dictation errors.   Lucrezia Starch, MD 10/08/20 2152

## 2020-10-08 NOTE — ED Notes (Signed)
Pt given IM Versed for agitation (screaming).  Pt is not physically aggressive.

## 2020-10-08 NOTE — ED Notes (Signed)
Report to include Situation, Background, Assessment, and Recommendations received from Amy RN. Patient disoriented, warm and dry, in no acute distress.UTA SI, HI, AVH and pain due to Advanced dementia. Patient made aware of Q15 minute rounds and Engineer, drilling presence for their safety.

## 2020-10-08 NOTE — ED Notes (Signed)
[facility] to transport to the [facility]

## 2020-10-08 NOTE — ED Notes (Signed)
Pt dressed out by this Probation officer and NT.  Black purse - glassed placed in purse Watch (red band) Tan slippers Gray sweat pants [employer]

## 2020-10-08 NOTE — ED Triage Notes (Signed)
Pt came with EMS from [facility] [place].  Per report, the patient has been agitated and the facility would like her to have a behavioral evaluation.  Pt screaming upon arrival.

## 2020-10-14 ENCOUNTER — Encounter: Payer: Self-pay | Admitting: Emergency Medicine

## 2020-10-14 ENCOUNTER — Emergency Department
Admission: EM | Admit: 2020-10-14 | Discharge: 2020-10-14 | Disposition: A | Discharge status: Home / Self Care | Payer: Medicare Other | Attending: Emergency Medicine | Admitting: Emergency Medicine

## 2020-10-14 ENCOUNTER — Other Ambulatory Visit: Payer: Self-pay

## 2020-10-14 DIAGNOSIS — Z79899 Other long term (current) drug therapy: Secondary | ICD-10-CM | POA: Insufficient documentation

## 2020-10-14 DIAGNOSIS — G20 Parkinson's disease: Secondary | ICD-10-CM | POA: Insufficient documentation

## 2020-10-14 DIAGNOSIS — M542 Cervicalgia: Secondary | ICD-10-CM | POA: Diagnosis not present

## 2020-10-14 DIAGNOSIS — I10 Essential (primary) hypertension: Secondary | ICD-10-CM | POA: Insufficient documentation

## 2020-10-14 DIAGNOSIS — R451 Restlessness and agitation: Secondary | ICD-10-CM | POA: Diagnosis not present

## 2020-10-14 DIAGNOSIS — F03911 Unspecified dementia, unspecified severity, with agitation: Secondary | ICD-10-CM

## 2020-10-14 DIAGNOSIS — Z8572 Personal history of non-Hodgkin lymphomas: Secondary | ICD-10-CM | POA: Diagnosis not present

## 2020-10-14 DIAGNOSIS — Z96652 Presence of left artificial knee joint: Secondary | ICD-10-CM | POA: Insufficient documentation

## 2020-10-14 DIAGNOSIS — Z87891 Personal history of nicotine dependence: Secondary | ICD-10-CM | POA: Insufficient documentation

## 2020-10-14 DIAGNOSIS — M549 Dorsalgia, unspecified: Secondary | ICD-10-CM | POA: Insufficient documentation

## 2020-10-14 DIAGNOSIS — F0390 Unspecified dementia without behavioral disturbance: Secondary | ICD-10-CM | POA: Insufficient documentation

## 2020-10-14 DIAGNOSIS — F0391 Unspecified dementia with behavioral disturbance: Secondary | ICD-10-CM

## 2020-10-14 MED ORDER — HALOPERIDOL LACTATE 5 MG/ML IJ SOLN
5.0000 mg | Freq: Once | INTRAMUSCULAR | Status: AC
  Administered 2020-10-14: 5 mg via INTRAMUSCULAR

## 2020-10-14 MED ORDER — HALOPERIDOL LACTATE 5 MG/ML IJ SOLN
INTRAMUSCULAR | Status: AC
  Filled 2020-10-14: qty 1

## 2020-10-14 MED ORDER — DIPHENHYDRAMINE HCL 50 MG/ML IJ SOLN
INTRAMUSCULAR | Status: AC
  Filled 2020-10-14: qty 1

## 2020-10-14 MED ORDER — LORAZEPAM 2 MG/ML IJ SOLN
2.0000 mg | Freq: Once | INTRAMUSCULAR | Status: AC
  Administered 2020-10-14: 2 mg via INTRAMUSCULAR

## 2020-10-14 MED ORDER — DIPHENHYDRAMINE HCL 50 MG/ML IJ SOLN
50.0000 mg | Freq: Once | INTRAMUSCULAR | Status: AC
  Administered 2020-10-14: 50 mg via INTRAMUSCULAR

## 2020-10-14 MED ORDER — LORAZEPAM 2 MG/ML IJ SOLN
INTRAMUSCULAR | Status: AC
  Filled 2020-10-14: qty 1

## 2020-10-14 NOTE — ED Triage Notes (Signed)
Pt to ED via EMS form the [facility] at [place]. Per EMS, pt fell out of a wheelchair and is complaining of back and neck pain to the [place] at [employer]. Pt has not expressed any pain to EMS. EMS was unable to get pt vitals. Pt is agitated and aggressive on arrival.

## 2020-10-14 NOTE — ED Notes (Signed)
Pt is very agitated and aggressive. Liane Comber RN and Raquel Sarna, RN at bedside to prevent pt from falling out of bed.

## 2020-10-14 NOTE — ED Notes (Signed)
Attempt to call report to the [facility] with no answer.

## 2020-10-14 NOTE — ED Notes (Signed)
Report given to [facility] [place].

## 2020-10-14 NOTE — ED Notes (Signed)
Call 911 to have [facility] transport patient back to [facility].

## 2020-10-14 NOTE — ED Provider Notes (Signed)
[facility] Emergency Department Provider Note   ____________________________________________   Event Date/Time   First MD Initiated Contact with Patient 10/14/20 2019     (approximate)  I have reviewed the triage vital signs and the nursing notes.   HISTORY  Chief Complaint Fall    HPI Sarah Donaldson is a 70 y.o. female with a past medical history of Parkinson's dementia who presents from her long-term care facility via EMS due to significant agitation.  Upon report, patient became agitated and threw herself out of her wheelchair.  Staff states that patient had been complaining of back and neck pain however she has voiced none of this to EMS or our hospital staff.  Patient is actively agitated, yelling, kicking, and punching at staff.  Further history and review of systems are unable to be obtained at this time         Past Medical History:  Diagnosis Date  . Cancer ([place])    Non-hodgkin's lymphoma (in remission)  . GERD (gastroesophageal reflux disease)    well-controlled w/ omeprazole  . Hyperlipidemia   . Knee joint cyst, left 02/07/2018  . Parkinson disease ([place])   . Parkinson disease [facility])     Patient Active Problem List   Diagnosis Date Noted  . Essential hypertension 06/02/2020  . Need for influenza vaccination 06/02/2020  . Encounter for annual health check of caregiver 04/08/2020  . Altered mental status   . Dementia due to Parkinson's disease with behavioral disturbance ([place])   . SDH (subdural hematoma) ([place]) 12/11/2019  . Falls frequently 12/11/2019  . S/P total knee replacement, left 06/07/2019  . Osteoarthritis of knee 04/27/2019  . Panic attacks 04/07/2019  . Psychosis ([place]) 04/04/2019  . Numbness 03/09/2018  . Lumbar spondylosis 03/07/2018  . Chronic pain of left knee 03/07/2018  . Chronic upper back pain 12/08/2017  . Benign neoplasm of hand 10/20/2017  . Chronic pain syndrome 10/20/2017  . Chronic pain of right  lower extremity 10/20/2017  . Chronic myofascial pain 10/20/2017  . Numbness and tingling 07/12/2017  . Sleep difficulties 07/12/2017  . Low back pain 01/18/2017  . Sleep behavior disorder, REM 01/18/2017  . Lumbar radiculopathy 11/01/2016  . Follicular lymphoma of extranodal and solid organ sites [facility]) 05/01/2016  . Lymphoma ([place]) 04/30/2016  . Hallux limitus 07/27/2015  . Low back pain with sciatica 02/13/2015  . Bilateral low back pain with sciatica 02/13/2015  . H/O adenomatous polyp of colon 04/06/2014  . Hx of adenomatous colonic polyps 04/06/2014  . Back ache 01/25/2014  . Adiposity 01/25/2014  . REM sleep behavior disorder 01/25/2014  . Disordered sleep 01/25/2014  . Has a tremor 01/25/2014  . Obesity, unspecified 01/25/2014  . Sleep disorder 01/25/2014  . Backache 01/25/2014  . Tremor 01/25/2014  . Obesity 01/25/2014  . Difficulty hearing 06/08/2012  . Hearing loss 06/08/2012  . Anxiety disorder 06/06/2012  . Colon polyp 06/06/2012  . Clinical depression 06/06/2012  . Hypercholesteremia 06/06/2012  . Idiopathic Parkinson's disease ([place]) 06/06/2012  . Detached retina 06/06/2012  . Depressive disorder 06/06/2012  . Retinal detachment 06/06/2012  . Parkinson's disease ([place]) 06/06/2012    Past Surgical History:  Procedure Laterality Date  . BACK SURGERY    . BUNIONECTOMY    . NASAL SEPTUM SURGERY    . RETINAL DETACHMENT SURGERY    . TOTAL KNEE ARTHROPLASTY Left 06/07/2019   Procedure: TOTAL KNEE ARTHROPLASTY;  Surgeon: Lovell Sheehan, MD;  Location: [facility] ORS;  Service: Orthopedics;  Laterality: Left;    Prior to Admission medications   Medication Sig Start Date End Date Taking? Authorizing Provider  carbidopa-levodopa (SINEMET IR) 25-100 MG tablet Take 2 tablets by mouth every 2 (two) hours. (between the hours of 8AM and 8PM)    [provider]  clonazePAM (KLONOPIN) 2 MG tablet Take 1 mg by mouth 4 (four) times daily. 04/03/20   [provider]  entacapone (COMTAN) 200 MG tablet Take 200 mg by mouth 4 (four) times daily.    [provider]  escitalopram (LEXAPRO) 10 MG tablet Take 10 mg by mouth daily.    [provider]  HYDROcodone-acetaminophen (NORCO/VICODIN) 5-325 MG tablet Take 1 tablet by mouth 2 (two) times daily. (take at lunch and bedtime)    [provider]  omeprazole (PRILOSEC) 10 MG capsule Take 10 mg by mouth daily.    [provider]  potassium chloride SA (KLOR-CON) 20 MEQ tablet Take 20 mEq by mouth daily.    [provider]  prednisoLONE acetate (PRED FORTE) 1 % ophthalmic suspension Place 1 drop into the left eye daily.    [provider]  pregabalin (LYRICA) 100 MG capsule Take 100 mg by mouth 3 (three) times daily. 11/18/19   [provider]  QUEtiapine (SEROQUEL) 100 MG tablet Take 1 tablet (100 mg total) by mouth at bedtime. 08/08/20   Clapacs, Madie Reno, MD  QUEtiapine (SEROQUEL) 50 MG tablet Take 1 tablet (50 mg total) by mouth 3 (three) times daily. 08/08/20   Clapacs, Madie Reno, MD  traZODone (DESYREL) 50 MG tablet Take 50 mg by mouth at bedtime.    [provider]    Allergies Percocet [oxycodone-acetaminophen], Baclofen, Codeine sulfate, Gabapentin, Terazosin, and Tramadol hcl  Family History  Problem Relation Age of Onset  . Heart disease Mother   . Alcohol abuse Father   . Breast cancer Neg Hx   . Mental illness Neg Hx     Social History Social History   Tobacco Use  . Smoking status: Former Research scientist (life sciences)  . Smokeless tobacco: Never Used  Vaping Use  . Vaping Use: Never used  Substance Use Topics  . Alcohol use: Yes    Alcohol/week: 0.0 standard drinks  . Drug use: No    Review of Systems Unable to assess ____________________________________________   PHYSICAL EXAM:  VITAL SIGNS: ED Triage Vitals  Enc Vitals Group     BP 10/14/20 2020 (!) 150/63     Pulse Rate 10/14/20 2020 96     Resp 10/14/20 2020 20     Temp  10/14/20 2020 98.3 F (36.8 C)     Temp Source 10/14/20 2020 Oral     SpO2 10/14/20 2020 98 %     Weight 10/14/20 2014 142 lb 10.2 oz (64.7 kg)     Height 10/14/20 2014 5\' 7"  (1.702 m)     Head Circumference --      Peak Flow --      Pain Score --      Pain Loc --      Pain Edu? --      Excl. in [place]? --    Constitutional: Acutely agitated.  Healthy-appearing elderly Caucasian female actively yelling. Eyes: Conjunctivae are normal. PERRL. Head: Atraumatic. Nose: No congestion/rhinnorhea. Mouth/Throat: Mucous membranes are moist. Neck: No stridor Cardiovascular: Grossly normal heart sounds.  Good peripheral circulation. Respiratory: Normal respiratory effort.  No retractions. Gastrointestinal: Soft and nontender. No distention. Musculoskeletal: No obvious deformities Neurologic: GCS 11.  Moves all extremities spontaneously Skin:  Skin is warm and dry. No rash noted. Psychiatric: Agitated.  Uncooperative  ____________________________________________   LABS (all labs ordered are listed, but only abnormal results are displayed)  Labs Reviewed - No data to display  PROCEDURES  Procedure(s) performed (including Critical Care):  Procedures   ____________________________________________   INITIAL IMPRESSION / ASSESSMENT AND PLAN / ED COURSE  As part of my medical decision making, I reviewed the following data within the [place] notes reviewed and incorporated, Old chart reviewed, and Notes from prior ED visits reviewed and incorporated      Patient grossly agitated. Given History and Exam I have low suspicion for toxic ingestion, anemia, hypothyroidism, infection, or ICH as a cause for this presentation. Interventions: Lorazepam 2mg  IM + Haldol 5mg  IM + Benadryl 50mg  IM  Reassess:  Patient calmly sleeping in bed upon reassessment.  No tenderness to palpation over neck, head, or back and therefore deferred imaging Disposition (Discharge): Plan  DC to long-term care facility      ____________________________________________   FINAL CLINICAL IMPRESSION(S) / ED DIAGNOSES  Final diagnoses:  Agitation due to dementia [facility])     ED Discharge Orders    None       Note:  This document was prepared using Dragon voice recognition software and may include unintentional dictation errors.   Naaman Plummer, MD 10/14/20 2125

## 2020-10-17 ENCOUNTER — Inpatient Hospital Stay
Admission: EM | Admit: 2020-10-17 | Discharge: 2020-10-20 | DRG: 071 | Disposition: A | Discharge status: Skilled Nursing Facility | Payer: Medicare Other | Source: Skilled Nursing Facility | Attending: Internal Medicine | Admitting: Internal Medicine

## 2020-10-17 ENCOUNTER — Other Ambulatory Visit: Payer: Self-pay

## 2020-10-17 ENCOUNTER — Encounter: Payer: Self-pay | Admitting: Emergency Medicine

## 2020-10-17 DIAGNOSIS — Z87891 Personal history of nicotine dependence: Secondary | ICD-10-CM

## 2020-10-17 DIAGNOSIS — Z888 Allergy status to other drugs, medicaments and biological substances status: Secondary | ICD-10-CM

## 2020-10-17 DIAGNOSIS — I10 Essential (primary) hypertension: Secondary | ICD-10-CM | POA: Diagnosis present

## 2020-10-17 DIAGNOSIS — F0280 Dementia in other diseases classified elsewhere without behavioral disturbance: Secondary | ICD-10-CM | POA: Diagnosis present

## 2020-10-17 DIAGNOSIS — F419 Anxiety disorder, unspecified: Secondary | ICD-10-CM | POA: Diagnosis present

## 2020-10-17 DIAGNOSIS — R410 Disorientation, unspecified: Secondary | ICD-10-CM | POA: Diagnosis not present

## 2020-10-17 DIAGNOSIS — G20 Parkinson's disease: Secondary | ICD-10-CM | POA: Diagnosis not present

## 2020-10-17 DIAGNOSIS — Z885 Allergy status to narcotic agent status: Secondary | ICD-10-CM

## 2020-10-17 DIAGNOSIS — R4182 Altered mental status, unspecified: Secondary | ICD-10-CM | POA: Diagnosis present

## 2020-10-17 DIAGNOSIS — G9341 Metabolic encephalopathy: Secondary | ICD-10-CM | POA: Diagnosis not present

## 2020-10-17 DIAGNOSIS — G20A1 Parkinson's disease without dyskinesia, without mention of fluctuations: Secondary | ICD-10-CM | POA: Diagnosis present

## 2020-10-17 DIAGNOSIS — Z20822 Contact with and (suspected) exposure to covid-19: Secondary | ICD-10-CM | POA: Diagnosis present

## 2020-10-17 DIAGNOSIS — L8990 Pressure ulcer of unspecified site, unspecified stage: Secondary | ICD-10-CM | POA: Insufficient documentation

## 2020-10-17 DIAGNOSIS — F29 Unspecified psychosis not due to a substance or known physiological condition: Secondary | ICD-10-CM | POA: Diagnosis present

## 2020-10-17 DIAGNOSIS — F32A Depression, unspecified: Secondary | ICD-10-CM | POA: Diagnosis present

## 2020-10-17 DIAGNOSIS — E6609 Other obesity due to excess calories: Secondary | ICD-10-CM

## 2020-10-17 DIAGNOSIS — L89152 Pressure ulcer of sacral region, stage 2: Secondary | ICD-10-CM | POA: Diagnosis present

## 2020-10-17 DIAGNOSIS — Z79899 Other long term (current) drug therapy: Secondary | ICD-10-CM

## 2020-10-17 DIAGNOSIS — F0281 Dementia in other diseases classified elsewhere with behavioral disturbance: Secondary | ICD-10-CM | POA: Diagnosis present

## 2020-10-17 DIAGNOSIS — Z8249 Family history of ischemic heart disease and other diseases of the circulatory system: Secondary | ICD-10-CM

## 2020-10-17 DIAGNOSIS — C8590 Non-Hodgkin lymphoma, unspecified, unspecified site: Secondary | ICD-10-CM | POA: Diagnosis present

## 2020-10-17 DIAGNOSIS — N3000 Acute cystitis without hematuria: Secondary | ICD-10-CM | POA: Diagnosis not present

## 2020-10-17 DIAGNOSIS — G4752 REM sleep behavior disorder: Secondary | ICD-10-CM | POA: Diagnosis present

## 2020-10-17 DIAGNOSIS — B964 Proteus (mirabilis) (morganii) as the cause of diseases classified elsewhere: Secondary | ICD-10-CM | POA: Diagnosis present

## 2020-10-17 DIAGNOSIS — E669 Obesity, unspecified: Secondary | ICD-10-CM | POA: Diagnosis present

## 2020-10-17 DIAGNOSIS — E785 Hyperlipidemia, unspecified: Secondary | ICD-10-CM | POA: Diagnosis present

## 2020-10-17 DIAGNOSIS — Z96652 Presence of left artificial knee joint: Secondary | ICD-10-CM | POA: Diagnosis present

## 2020-10-17 DIAGNOSIS — Z66 Do not resuscitate: Secondary | ICD-10-CM | POA: Diagnosis present

## 2020-10-17 DIAGNOSIS — F05 Delirium due to known physiological condition: Secondary | ICD-10-CM | POA: Diagnosis present

## 2020-10-17 DIAGNOSIS — N390 Urinary tract infection, site not specified: Secondary | ICD-10-CM | POA: Diagnosis present

## 2020-10-17 DIAGNOSIS — K219 Gastro-esophageal reflux disease without esophagitis: Secondary | ICD-10-CM | POA: Diagnosis present

## 2020-10-17 HISTORY — DX: Unspecified dementia, unspecified severity, without behavioral disturbance, psychotic disturbance, mood disturbance, and anxiety: F03.90

## 2020-10-17 LAB — BASIC METABOLIC PANEL
Anion gap: 6 (ref 5–15)
BUN: 19 mg/dL (ref 8–23)
CO2: 26 mmol/L (ref 22–32)
Calcium: 8.6 mg/dL — ABNORMAL LOW (ref 8.9–10.3)
Chloride: 108 mmol/L (ref 98–111)
Creatinine, Ser: 0.46 mg/dL (ref 0.44–1.00)
GFR, Estimated: >60 mL/min (ref >60)
Glucose, Bld: 92 mg/dL (ref 70–99)
Potassium: 3.9 mmol/L (ref 3.5–5.1)
Sodium: 140 mmol/L (ref 135–145)

## 2020-10-17 LAB — URINALYSIS, ROUTINE W REFLEX MICROSCOPIC
Bacteria, UA: NONE SEEN
Bilirubin Urine: NEGATIVE
Glucose, UA: NEGATIVE mg/dL
Hgb urine dipstick: NEGATIVE
Ketones, ur: 20 mg/dL — AB
Nitrite: NEGATIVE
Protein, ur: NEGATIVE mg/dL
Specific Gravity, Urine: 1.028 (ref 1.005–1.030)
pH: 5 (ref 5.0–8.0)

## 2020-10-17 LAB — CBC WITH DIFFERENTIAL/PLATELET
Abs Immature Granulocytes: 0.01 10*3/uL (ref 0.00–0.07)
Basophils Absolute: 0 10*3/uL (ref 0.0–0.1)
Basophils Relative: 0 %
Eosinophils Absolute: 0.1 10*3/uL (ref 0.0–0.5)
Eosinophils Relative: 3 %
HCT: 38.9 % (ref 36.0–46.0)
Hemoglobin: 12.2 g/dL (ref 12.0–15.0)
Immature Granulocytes: 0 %
Lymphocytes Relative: 28 %
Lymphs Abs: 1.5 10*3/uL (ref 0.7–4.0)
MCH: 27.8 pg (ref 26.0–34.0)
MCHC: 31.4 g/dL (ref 30.0–36.0)
MCV: 88.6 fL (ref 80.0–100.0)
Monocytes Absolute: 0.5 10*3/uL (ref 0.1–1.0)
Monocytes Relative: 9 %
Neutro Abs: 3.2 10*3/uL (ref 1.7–7.7)
Neutrophils Relative %: 60 %
Platelets: 132 10*3/uL — ABNORMAL LOW (ref 150–400)
RBC: 4.39 MIL/uL (ref 3.87–5.11)
RDW: 14.6 % (ref 11.5–15.5)
WBC: 5.4 10*3/uL (ref 4.0–10.5)
nRBC: 0 % (ref 0.0–0.2)

## 2020-10-17 MED ORDER — ACETAMINOPHEN 325 MG PO TABS: 325.0000 mg | ORAL_TABLET | Freq: Four times a day (QID) | ORAL | Status: DC | PRN

## 2020-10-17 MED ORDER — HALOPERIDOL LACTATE 2 MG/ML PO CONC: 5.0000 mg | Freq: Four times a day (QID) | ORAL | Status: DC | PRN

## 2020-10-17 MED ORDER — PANTOPRAZOLE SODIUM 40 MG PO TBEC
40.0000 mg | DELAYED_RELEASE_TABLET | Freq: Every day | ORAL | Status: DC
  Administered 2020-10-19 – 2020-10-20 (×2): 40 mg via ORAL
  Filled 2020-10-17 (×4): qty 1

## 2020-10-17 MED ORDER — TRAZODONE HCL 50 MG PO TABS
50.0000 mg | ORAL_TABLET | Freq: Every day | ORAL | Status: DC
  Administered 2020-10-17 – 2020-10-19 (×3): 50 mg via ORAL
  Filled 2020-10-17 (×3): qty 1

## 2020-10-17 MED ORDER — LORAZEPAM 2 MG/ML IJ SOLN
1.0000 mg | Freq: Four times a day (QID) | INTRAMUSCULAR | Status: DC | PRN
  Administered 2020-10-17: 1 mg via INTRAVENOUS
  Filled 2020-10-17: qty 1

## 2020-10-17 MED ORDER — CLONAZEPAM 1 MG PO TABS
1.0000 mg | ORAL_TABLET | Freq: Four times a day (QID) | ORAL | Status: DC
  Administered 2020-10-17 – 2020-10-20 (×10): 1 mg via ORAL
  Filled 2020-10-17 (×5): qty 1
  Filled 2020-10-17: qty 2
  Filled 2020-10-17 (×3): qty 1
  Filled 2020-10-17 (×2): qty 2

## 2020-10-17 MED ORDER — HALOPERIDOL LACTATE 5 MG/ML IJ SOLN
INTRAMUSCULAR | Status: AC
  Administered 2020-10-17: 5 mg via INTRAVENOUS
  Filled 2020-10-17: qty 1

## 2020-10-17 MED ORDER — CARBIDOPA-LEVODOPA 25-100 MG PO TABS
2.0000 | ORAL_TABLET | ORAL | Status: DC
  Administered 2020-10-18 – 2020-10-20 (×16): 2 via ORAL
  Filled 2020-10-17 (×22): qty 2

## 2020-10-17 MED ORDER — HALOPERIDOL LACTATE 5 MG/ML IJ SOLN
5.0000 mg | Freq: Four times a day (QID) | INTRAMUSCULAR | Status: DC | PRN
Start: 2020-10-17 — End: 2020-10-20
  Filled 2020-10-17: qty 1

## 2020-10-17 MED ORDER — SODIUM CHLORIDE 0.9 % IV SOLN
1.0000 g | INTRAVENOUS | Status: DC
  Administered 2020-10-18 – 2020-10-19 (×2): 1 g via INTRAVENOUS
  Filled 2020-10-17: qty 1
  Filled 2020-10-17: qty 10
  Filled 2020-10-17: qty 1

## 2020-10-17 MED ORDER — ACETAMINOPHEN 650 MG RE SUPP: 325.0000 mg | Freq: Four times a day (QID) | RECTAL | Status: DC | PRN

## 2020-10-17 MED ORDER — QUETIAPINE FUMARATE 25 MG PO TABS
100.0000 mg | ORAL_TABLET | Freq: Every day | ORAL | Status: DC
  Administered 2020-10-18 – 2020-10-19 (×2): 100 mg via ORAL
  Filled 2020-10-17 (×2): qty 4

## 2020-10-17 MED ORDER — ENOXAPARIN SODIUM 40 MG/0.4ML ~~LOC~~ SOLN
40.0000 mg | SUBCUTANEOUS | Status: DC
  Administered 2020-10-17 – 2020-10-19 (×3): 40 mg via SUBCUTANEOUS
  Filled 2020-10-17 (×3): qty 0.4

## 2020-10-17 MED ORDER — ONDANSETRON HCL 4 MG/2ML IJ SOLN: 4.0000 mg | Freq: Four times a day (QID) | INTRAMUSCULAR | Status: DC | PRN

## 2020-10-17 MED ORDER — QUETIAPINE FUMARATE 25 MG PO TABS: 50.0000 mg | ORAL_TABLET | Freq: Every day | ORAL | Status: DC

## 2020-10-17 MED ORDER — ESCITALOPRAM OXALATE 10 MG PO TABS
10.0000 mg | ORAL_TABLET | Freq: Every day | ORAL | Status: DC
Start: 2020-10-18 — End: 2020-10-20
  Administered 2020-10-19 – 2020-10-20 (×2): 10 mg via ORAL
  Filled 2020-10-17 (×4): qty 1

## 2020-10-17 MED ORDER — CLONAZEPAM 0.5 MG PO TABS
2.0000 mg | ORAL_TABLET | ORAL | Status: AC
  Administered 2020-10-17: 2 mg via ORAL
  Filled 2020-10-17: qty 4

## 2020-10-17 MED ORDER — PREGABALIN 50 MG PO CAPS
100.0000 mg | ORAL_CAPSULE | Freq: Three times a day (TID) | ORAL | Status: DC
  Administered 2020-10-17 – 2020-10-20 (×8): 100 mg via ORAL
  Filled 2020-10-17 (×8): qty 2

## 2020-10-17 MED ORDER — ENTACAPONE 200 MG PO TABS
200.0000 mg | ORAL_TABLET | ORAL | Status: DC
  Administered 2020-10-18 – 2020-10-20 (×16): 200 mg via ORAL
  Filled 2020-10-17 (×26): qty 1

## 2020-10-17 MED ORDER — LORAZEPAM 2 MG/ML IJ SOLN
1.0000 mg | Freq: Once | INTRAMUSCULAR | Status: AC
  Administered 2020-10-17: 1 mg via INTRAVENOUS
  Filled 2020-10-17: qty 1

## 2020-10-17 MED ORDER — SODIUM CHLORIDE 0.9 % IV SOLN
1.0000 g | Freq: Once | INTRAVENOUS | Status: AC
  Administered 2020-10-17: 1 g via INTRAVENOUS
  Filled 2020-10-17: qty 10

## 2020-10-17 MED ORDER — QUETIAPINE FUMARATE 25 MG PO TABS
50.0000 mg | ORAL_TABLET | ORAL | Status: AC
  Administered 2020-10-17: 50 mg via ORAL
  Filled 2020-10-17: qty 2

## 2020-10-17 MED ORDER — ONDANSETRON HCL 4 MG PO TABS: 4.0000 mg | ORAL_TABLET | Freq: Four times a day (QID) | ORAL | Status: DC | PRN

## 2020-10-17 MED ORDER — QUETIAPINE FUMARATE 25 MG PO TABS: 50.0000 mg | ORAL_TABLET | ORAL | Status: DC

## 2020-10-17 NOTE — ED Notes (Signed)
Hospitalist at bedside 

## 2020-10-17 NOTE — ED Notes (Signed)
Sent MD. Cox regarding pt being extremely anxious

## 2020-10-17 NOTE — ED Notes (Signed)
Evaluation do to fall and dark urine per [facility] [place]

## 2020-10-17 NOTE — ED Notes (Addendum)
Spoke with Charge , Occupational hygienist

## 2020-10-17 NOTE — ED Provider Notes (Signed)
[facility] Emergency Department Provider Note   ____________________________________________   Event Date/Time   First MD Initiated Contact with Patient 10/17/20 1744     (approximate)  I have reviewed the triage vital signs and the nursing notes.   HISTORY  Chief Complaint Fall  EM caveat: Dementia  HPI Sarah Donaldson is a 70 y.o. female Per the patient's husband he was called by staff due to the patient having behavioral outburst, but also was noted to have slid out of her wheelchair onto the floor.  He does not think there was actually any significant fall involved.  Also concerned though that she may have a urinary tract infection with changes in her urine noticed over the last couple of days and escalating behavioral issues at her facility.  Husband reports he had to go out twice today because of increasing levels of agitation  She has been back and forth to the ER several times mostly due to agitation, seems to be worsening and husband reports it seems to be escalating beyond the abilities of her current care setting   Past Medical History:  Diagnosis Date  . Cancer ([place])    Non-hodgkin's lymphoma (in remission)  . Dementia ([place])   . GERD (gastroesophageal reflux disease)    well-controlled w/ omeprazole  . Hyperlipidemia   . Knee joint cyst, left 02/07/2018  . Parkinson disease ([place])   . Parkinson disease [facility])     Patient Active Problem List   Diagnosis Date Noted  . UTI (urinary tract infection) 10/17/2020  . Essential hypertension 06/02/2020  . Need for influenza vaccination 06/02/2020  . Encounter for annual health check of caregiver 04/08/2020  . Altered mental status   . Dementia due to Parkinson's disease with behavioral disturbance ([place])   . SDH (subdural hematoma) ([place]) 12/11/2019  . Falls frequently 12/11/2019  . S/P total knee replacement, left 06/07/2019  . Osteoarthritis of knee 04/27/2019  . Panic attacks 04/07/2019   . Psychosis ([place]) 04/04/2019  . Numbness 03/09/2018  . Lumbar spondylosis 03/07/2018  . Chronic pain of left knee 03/07/2018  . Chronic upper back pain 12/08/2017  . Benign neoplasm of hand 10/20/2017  . Chronic pain syndrome 10/20/2017  . Chronic pain of right lower extremity 10/20/2017  . Chronic myofascial pain 10/20/2017  . Numbness and tingling 07/12/2017  . Sleep difficulties 07/12/2017  . Low back pain 01/18/2017  . Sleep behavior disorder, REM 01/18/2017  . Lumbar radiculopathy 11/01/2016  . Follicular lymphoma of extranodal and solid organ sites [facility]) 05/01/2016  . Lymphoma ([place]) 04/30/2016  . Hallux limitus 07/27/2015  . Low back pain with sciatica 02/13/2015  . Bilateral low back pain with sciatica 02/13/2015  . H/O adenomatous polyp of colon 04/06/2014  . Hx of adenomatous colonic polyps 04/06/2014  . Back ache 01/25/2014  . Adiposity 01/25/2014  . REM sleep behavior disorder 01/25/2014  . Disordered sleep 01/25/2014  . Has a tremor 01/25/2014  . Obesity, unspecified 01/25/2014  . Sleep disorder 01/25/2014  . Backache 01/25/2014  . Tremor 01/25/2014  . Obesity 01/25/2014  . Difficulty hearing 06/08/2012  . Hearing loss 06/08/2012  . Anxiety disorder 06/06/2012  . Colon polyp 06/06/2012  . Clinical depression 06/06/2012  . Hypercholesteremia 06/06/2012  . Idiopathic Parkinson's disease ([place]) 06/06/2012  . Detached retina 06/06/2012  . Depressive disorder 06/06/2012  . Retinal detachment 06/06/2012  . Parkinson's disease ([place]) 06/06/2012    Past Surgical History:  Procedure Laterality Date  . BACK SURGERY    .  BUNIONECTOMY    . NASAL SEPTUM SURGERY    . RETINAL DETACHMENT SURGERY    . TOTAL KNEE ARTHROPLASTY Left 06/07/2019   Procedure: TOTAL KNEE ARTHROPLASTY;  Surgeon: Lovell Sheehan, MD;  Location: [facility] ORS;  Service: Orthopedics;  Laterality: Left;    Prior to Admission medications   Medication Sig Start Date End Date Taking? Authorizing  Provider  acetaminophen (TYLENOL) 325 MG tablet Take 650 mg by mouth every 4 (four) hours as needed for fever or mild pain.   Yes [provider]  ALPRAZolam (XANAX) 0.25 MG tablet Take 0.25 mg by mouth 2 (two) times daily as needed for sleep.   Yes [provider]  carbidopa-levodopa (SINEMET IR) 25-100 MG tablet Take 2 tablets by mouth every 2 (two) hours. (between the hours of 8AM and 8PM)   Yes [provider]  clonazePAM (KLONOPIN) 1 MG tablet Take 1 mg by mouth 4 (four) times daily. 04/03/20  Yes [provider]  entacapone (COMTAN) 200 MG tablet Take 200 mg by mouth 6 (six) times daily.   Yes [provider]  escitalopram (LEXAPRO) 10 MG tablet Take 10 mg by mouth daily.   Yes [provider]  HYDROcodone-acetaminophen (NORCO/VICODIN) 5-325 MG tablet Take 1 tablet by mouth 2 (two) times daily. (take at lunch and bedtime)   Yes [provider]  omeprazole (PRILOSEC) 10 MG capsule Take 10 mg by mouth daily.   Yes [provider]  potassium chloride (KLOR-CON) 10 MEQ tablet Take 10 mEq by mouth daily.   Yes [provider]  prednisoLONE acetate (PRED FORTE) 1 % ophthalmic suspension Place 1 drop into the left eye daily.   Yes [provider]  pregabalin (LYRICA) 100 MG capsule Take 100 mg by mouth 3 (three) times daily. 11/18/19  Yes [provider]  QUEtiapine (SEROQUEL) 100 MG tablet Take 1 tablet (100 mg total) by mouth at bedtime. Patient taking differently: Take 50 mg by mouth every morning. 08/08/20  Yes Clapacs, Madie Reno, MD  QUEtiapine (SEROQUEL) 50 MG tablet Take 1 tablet (50 mg total) by mouth 3 (three) times daily. Patient taking differently: Take 100 mg by mouth 3 (three) times daily. (1200,1600,2000) 08/08/20  Yes Clapacs, Madie Reno, MD  traZODone (DESYREL) 50 MG tablet Take 50 mg by mouth at bedtime.   Yes [provider]    Allergies Percocet [oxycodone-acetaminophen], Baclofen,  Codeine sulfate, Gabapentin, Terazosin, and Tramadol hcl  Family History  Problem Relation Age of Onset  . Heart disease Mother   . Alcohol abuse Father   . Breast cancer Neg Hx   . Mental illness Neg Hx     Social History Social History   Tobacco Use  . Smoking status: Former Research scientist (life sciences)  . Smokeless tobacco: Never Used  Vaping Use  . Vaping Use: Never used  Substance Use Topics  . Alcohol use: Yes    Alcohol/week: 0.0 standard drinks  . Drug use: No    Review of Systems  EM caveat.  Husband reports she does seem to be a little more on edge than typical.  Concerns for possible UTI.  Husband reports she does have a small bruise under her eye but this is from a few weeks ago when she fell  ____________________________________________   PHYSICAL EXAM:  VITAL SIGNS: ED Triage Vitals  Enc Vitals Group     BP 10/17/20 1746 109/71     Pulse Rate 10/17/20 1746 98     Resp 10/17/20 1746 20  Temp 10/17/20 1746 (!) 97.5 F (36.4 C)     Temp Source 10/17/20 1746 Oral     SpO2 10/17/20 1746 97 %     Weight 10/17/20 1752 142 lb 10.2 oz (64.7 kg)     Height 10/17/20 1752 5\' 7"  (1.702 m)     Head Circumference --      Peak Flow --      Pain Score 10/17/20 1751 0     Pain Loc --      Pain Edu? --      Excl. in [place]? --     Constitutional: Alert and oriented to self and recognizes her husband but not to year or place.  Occasionally will get agitated pulling at things yelling out at staff.  Overall though she does not appear acutely ill. Eyes: Conjunctivae are normal. Head: Atraumatic except for a small old contusion under her orbit which has been verifies not new. Nose: No congestion/rhinnorhea. Mouth/Throat: Mucous membranes are moist. Neck: No stridor.  Cardiovascular: Normal rate, regular rhythm. Grossly normal heart sounds.  Good peripheral circulation. Respiratory: Normal respiratory effort.  No retractions. Lungs CTAB. Gastrointestinal: Soft and nontender. No  distention. Musculoskeletal: No lower extremity tenderness nor edema.  No obvious injuries to the extremities.  Use all extremities well. Neurologic: Generally pressured speech, somewhat impatient, agitated at times.  Husband reports that his symptoms have been seen many at times, but seem to be escalating in the last day or 2 Skin:  Skin is warm, dry and intact. No rash noted. Psychiatric: Mood and affect are elevated, not violent, but easily agitated  ____________________________________________   LABS (all labs ordered are listed, but only abnormal results are displayed)  Labs Reviewed  URINALYSIS, ROUTINE W REFLEX MICROSCOPIC - Abnormal; Notable for the following components:      Result Value   Color, Urine AMBER (*)    APPearance CLEAR (*)    Ketones, ur 20 (*)    Leukocytes,Ua LARGE (*)    All other components within normal limits  CBC WITH DIFFERENTIAL/PLATELET - Abnormal; Notable for the following components:   Platelets 132 (*)    All other components within normal limits  BASIC METABOLIC PANEL - Abnormal; Notable for the following components:   Calcium 8.6 (*)    All other components within normal limits  URINE CULTURE  BASIC METABOLIC PANEL  CBC  TSH   ____________________________________________  EKG  Reviewed inter by me at 1755 Heart rate 70 QRS 100 QTc 450 Normal sinus rhythm, no evidence of acute ischemia ____________________________________________  RADIOLOGY   ____________________________________________   PROCEDURES  Procedure(s) performed: None  Procedures  Critical Care performed: No  ____________________________________________   INITIAL IMPRESSION / ASSESSMENT AND PLAN / ED COURSE  Pertinent labs & imaging results that were available during my care of the patient were reviewed by me and considered in my medical decision making (see chart for details).   Patient presents for evaluation after escalating behavioral concerns with  possible concern for UTI.  Labs today and evaluation do not indicate sepsis but she does have what appears to be a UTI, will order Rocephin.  Additionally her behavior seems to be escalating possibly beyond the abilities of her care center, and she definitely shows agitation here that is requiring sedating medication for calming.  I suspect that the patient likely suffering some escalation of agitation possible delirium and associated with urinary tract infection.  I did discuss obtaining further imaging such as CT of the head with  the husband, but based on the patient's care goals this with shared medical decision making will be avoided at this point.  I think we have a reasonable explanation for her escalating behavior and treating the UTI, there is no overt sign of head injury and the reported she slid out of her wheelchair without notable head injury known.  No acute cardiac or respiratory symptoms noted.  Reassuring EKG.  Clinical Course as of 10/17/20 2020  Thu Oct 17, 2020  1755 Patient is very elevated in speech, motions and activities.  Seems fairly uninhibited.  Is noted to take Klonopin and Seroquel outpatient, will provide doses here for calming in the ER. [MQ]    Clinical Course User Index [MQ] Delman Kitten, MD    Admission discussed with hospitalist Dr. Tobie Poet.  Will admit for further care due to UTI with associated delirium altered mental status agitation  ----------------------------------------- 8:19 PM on 10/17/2020 -----------------------------------------  Patient is now calling, occasionally yells out but for the most part calm resting in bed without distress.  Husband at bedside. ____________________________________________   FINAL CLINICAL IMPRESSION(S) / ED DIAGNOSES  Final diagnoses:  Lower urinary tract infection, acute  Delirium        Note:  This document was prepared using Dragon voice recognition software and may include unintentional dictation  errors       Delman Kitten, MD 10/17/20 2020

## 2020-10-17 NOTE — H&P (Signed)
History and Physical   Sarah Donaldson AVW:[id number] DOB: 06/30/1951 DOA: 10/17/2020  PCP: Sarah Athens, MD  Outpatient Specialists: Dr. Melrose Donaldson, neurology Patient coming from: [place] at [facility]  I have personally briefly reviewed patient's old medical records in [place].  Chief Concern: Altered mentation, worsening delirium, abnormal urine color  HPI: Sarah Donaldson is a 70 y.o. female with medical history significant for Parkinson's, dementia, Parkinson's related delirium, sundowning, depression/anxiety, presents to the emergency department for chief concerns of dark-colored urine and worsening delirium.  Per spouse at bedside, facility contacted Sarah Donaldson stating that she has been abnormal since 8 PM on 10/16/2020.  At bedside, patient was able to tell me her first and last name.  She was not able to tell me her age, once stating that she is 36 and then 32.  She was not able to tell me the current year nor her current location.  She turned and ask her husband if her husband is seeing another woman.  At bedside, she did wince with suprapubic palpation.  Multiple times during my evaluation, patient yelled at her husband and was very confused.  Vaccination: Moderna 10/02/19 and 10/30/19  ROS: Unable to complete due to delirium  ED Course: Discussed with ED provider, patient requiring hospitalization due to delirium secondary to urinary tract infection.  Vitals in the ED was stable with temperature of 97.5, respiration rate of 22, heart rate of 72, blood pressure 94/78, satting at 95% on room air.  UA in the emergency department was remarkable for large amounts of leukocytes.  She is status post ceftriaxone IV 1 mg, Ativan 1 mg IV, clonazepam 2 mg p.o., quetiapine 50 mg p.o. per ED provider  Assessment/Plan  Active Problems:   Clinical depression   Parkinson's disease ([place])   Sleep behavior disorder, REM   Obesity   Psychosis ([place])   Altered mental  status   Dementia due to Parkinson's disease with behavioral disturbance ([place])   Essential hypertension   UTI (urinary tract infection)   # Pyuria-with worsening dementia/delirium -UA was positive for large number of leukocytes -Suspect UTI -Status post ceftriaxone 1 g IV one-time in the ED, we will continue this -Urine culture in process  # Delirium/worsening psychosis -A.m. team to consult psychiatry for medication readjustment -One-to-one sitter at bedside  # Parkinson's with Parkinson's psychosis # Dementia-resume carbidopa-levodopa 25-100 mg immediate release, take 2 every 2 hours and take extra 1 to 2 tablets at night resumed  # Low-grade non-Hodgkin's lymphoma-outpatient follow-up  # Goals of care-patient husband Sarah Donaldson at bedside reports that he believes patient needs a different facility that has around-the-clock nursing staff that can handle Sarah Donaldson's agitation, sundowning, delirium -TOC consulted  Chart reviewed.   DVT prophylaxis: Enoxaparin Code Status: DNR, spouse brought DNR documents signed Diet: Heart healthy Family Communication: Updated spouse at bedside Disposition Plan: Pending clinical course Consults called: TOC Admission status: Observation with telemetry  Past Medical History:  Diagnosis Date  . Cancer ([place])    Non-hodgkin's lymphoma (in remission)  . Dementia ([place])   . GERD (gastroesophageal reflux disease)    well-controlled w/ omeprazole  . Hyperlipidemia   . Knee joint cyst, left 02/07/2018  . Parkinson disease ([place])   . Parkinson disease [facility])    Past Surgical History:  Procedure Laterality Date  . BACK SURGERY    . BUNIONECTOMY    . NASAL SEPTUM SURGERY    . RETINAL DETACHMENT SURGERY    . TOTAL  KNEE ARTHROPLASTY Left 06/07/2019   Procedure: TOTAL KNEE ARTHROPLASTY;  Surgeon: Sarah Sheehan, MD;  Location: [facility] ORS;  Service: Orthopedics;  Laterality: Left;   Social History:  reports that she has quit smoking. She has never  used smokeless tobacco. She reports current alcohol use. She reports that she does not use drugs.  Allergies  Allergen Reactions  . Percocet [Oxycodone-Acetaminophen]     Hallucination  . Baclofen     Caused numbness in mouth   . Codeine Sulfate Nausea And Vomiting  . Gabapentin Swelling    Mouth swelling   . Terazosin   . Tramadol Hcl     Interferes with Serotonin levels which affects her Parkinsons   Family History  Problem Relation Age of Onset  . Heart disease Mother   . Alcohol abuse Father   . Breast cancer Neg Hx   . Mental illness Neg Hx    Family history: Family history reviewed and not pertinent  Prior to Admission medications   Medication Sig Start Date End Date Taking? Authorizing Provider  acetaminophen (TYLENOL) 325 MG tablet Take 650 mg by mouth every 4 (four) hours as needed for fever or mild pain.   Yes [provider]  ALPRAZolam (XANAX) 0.25 MG tablet Take 0.25 mg by mouth 2 (two) times daily as needed for sleep.   Yes [provider]  carbidopa-levodopa (SINEMET IR) 25-100 MG tablet Take 2 tablets by mouth every 2 (two) hours. (between the hours of 8AM and 8PM)   Yes [provider]  clonazePAM (KLONOPIN) 1 MG tablet Take 1 mg by mouth 4 (four) times daily. 04/03/20  Yes [provider]  entacapone (COMTAN) 200 MG tablet Take 200 mg by mouth 6 (six) times daily.   Yes [provider]  escitalopram (LEXAPRO) 10 MG tablet Take 10 mg by mouth daily.   Yes [provider]  HYDROcodone-acetaminophen (NORCO/VICODIN) 5-325 MG tablet Take 1 tablet by mouth 2 (two) times daily. (take at lunch and bedtime)   Yes [provider]  omeprazole (PRILOSEC) 10 MG capsule Take 10 mg by mouth daily.   Yes [provider]  potassium chloride (KLOR-CON) 10 MEQ tablet Take 10 mEq by mouth daily.   Yes [provider]  prednisoLONE acetate (PRED FORTE) 1 % ophthalmic suspension Place 1 drop into the left  eye daily.   Yes [provider]  pregabalin (LYRICA) 100 MG capsule Take 100 mg by mouth 3 (three) times daily. 11/18/19  Yes [provider]  QUEtiapine (SEROQUEL) 100 MG tablet Take 1 tablet (100 mg total) by mouth at bedtime. Patient taking differently: Take 50 mg by mouth every morning. 08/08/20  Yes Clapacs, Madie Reno, MD  QUEtiapine (SEROQUEL) 50 MG tablet Take 1 tablet (50 mg total) by mouth 3 (three) times daily. Patient taking differently: Take 100 mg by mouth 3 (three) times daily. (1200,1600,2000) 08/08/20  Yes Clapacs, Madie Reno, MD  traZODone (DESYREL) 50 MG tablet Take 50 mg by mouth at bedtime.   Yes [provider]   Physical Exam: Vitals:   10/17/20 1830 10/17/20 1900 10/17/20 1930 10/17/20 2000  BP: 132/68 105/69 94/78 112/69  Pulse: 73 69 72 71  Resp: (!) 23 16 18 17   Temp:      TempSrc:      SpO2: 97% 99% 99% 93%  Weight:      Height:       Constitutional: appears older than chronological age, confused, NAD, calm, comfortable Eyes: PERRL,  lids and conjunctivae normal ENMT: Mucous membranes are moist. Posterior pharynx clear of any exudate or lesions. Age-appropriate dentition. Hearing appropriate Neck: normal, supple, no masses, no thyromegaly Respiratory: clear to auscultation bilaterally, no wheezing, no crackles. Normal respiratory effort. No accessory muscle use.  Cardiovascular: Regular rate and rhythm, no murmurs / rubs / gallops. No extremity edema. 2+ pedal pulses. No carotid bruits.  Abdomen: Suprapubic tenderness, no masses palpated, no hepatosplenomegaly. Bowel sounds positive.  Musculoskeletal: no clubbing / cyanosis. No joint deformity upper and lower extremities. Good ROM, no contractures, no atrophy. Normal muscle tone.  Skin: no rashes, lesions, ulcers. No induration Neurologic: Sensation intact. Strength 5/5 in all 4.  Psychiatric: Confused, agitated, alert to self.  Was not able to tell me age, current year, current  location.  EKG: independently reviewed, showing sinus rhythm with rate of 73, QTc 449  Chest x-ray on Admission: Not indicated  Labs on Admission: I have personally reviewed following labs  CBC: Recent Labs  Lab 10/17/20 1812  WBC 5.4  NEUTROABS 3.2  HGB 12.2  HCT 38.9  MCV 88.6  PLT 110*   Basic Metabolic Panel: Recent Labs  Lab 10/17/20 1812  NA 140  K 3.9  CL 108  CO2 26  GLUCOSE 92  BUN 19  CREATININE 0.46  CALCIUM 8.6*   GFR: Estimated Creatinine Clearance: 62.7 mL/min (by C-G formula based on SCr of 0.46 mg/dL).  Urine analysis:    Component Value Date/Time   COLORURINE AMBER (A) 10/17/2020 1802   APPEARANCEUR CLEAR (A) 10/17/2020 1802   LABSPEC 1.028 10/17/2020 1802   PHURINE 5.0 10/17/2020 1802   GLUCOSEU NEGATIVE 10/17/2020 1802   HGBUR NEGATIVE 10/17/2020 1802   BILIRUBINUR NEGATIVE 10/17/2020 1802   BILIRUBINUR 1mg  01/05/2020 0933   KETONESUR 20 (A) 10/17/2020 1802   PROTEINUR NEGATIVE 10/17/2020 1802   UROBILINOGEN 4.0 (A) 01/05/2020 0933   NITRITE NEGATIVE 10/17/2020 1802   LEUKOCYTESUR LARGE (A) 10/17/2020 1802   Rocky Gladden N Maryagnes Carrasco D.O. [facility]  If 7PM-7AM, please contact overnight-coverage provider If 7AM-7PM, please contact day coverage provider [email or web address]  10/17/2020, 8:32 PM

## 2020-10-17 NOTE — ED Notes (Signed)
ED Provider at bedside. 

## 2020-10-17 NOTE — ED Notes (Signed)
Husband left home ,

## 2020-10-17 NOTE — ED Notes (Signed)
Pt 1:1 with nurse tech sitting at bedside d/t agitation.

## 2020-10-17 NOTE — ED Notes (Signed)
Husband at bedside.  

## 2020-10-18 DIAGNOSIS — F32A Depression, unspecified: Secondary | ICD-10-CM | POA: Diagnosis present

## 2020-10-18 DIAGNOSIS — Z20822 Contact with and (suspected) exposure to covid-19: Secondary | ICD-10-CM | POA: Diagnosis present

## 2020-10-18 DIAGNOSIS — Z888 Allergy status to other drugs, medicaments and biological substances status: Secondary | ICD-10-CM | POA: Diagnosis not present

## 2020-10-18 DIAGNOSIS — N390 Urinary tract infection, site not specified: Secondary | ICD-10-CM

## 2020-10-18 DIAGNOSIS — B964 Proteus (mirabilis) (morganii) as the cause of diseases classified elsewhere: Secondary | ICD-10-CM | POA: Diagnosis present

## 2020-10-18 DIAGNOSIS — F419 Anxiety disorder, unspecified: Secondary | ICD-10-CM | POA: Diagnosis present

## 2020-10-18 DIAGNOSIS — Z885 Allergy status to narcotic agent status: Secondary | ICD-10-CM | POA: Diagnosis not present

## 2020-10-18 DIAGNOSIS — Z87891 Personal history of nicotine dependence: Secondary | ICD-10-CM | POA: Diagnosis not present

## 2020-10-18 DIAGNOSIS — G20 Parkinson's disease: Secondary | ICD-10-CM | POA: Diagnosis present

## 2020-10-18 DIAGNOSIS — C8590 Non-Hodgkin lymphoma, unspecified, unspecified site: Secondary | ICD-10-CM | POA: Diagnosis present

## 2020-10-18 DIAGNOSIS — Z79899 Other long term (current) drug therapy: Secondary | ICD-10-CM | POA: Diagnosis not present

## 2020-10-18 DIAGNOSIS — Z96652 Presence of left artificial knee joint: Secondary | ICD-10-CM | POA: Diagnosis present

## 2020-10-18 DIAGNOSIS — Z8249 Family history of ischemic heart disease and other diseases of the circulatory system: Secondary | ICD-10-CM | POA: Diagnosis not present

## 2020-10-18 DIAGNOSIS — G9341 Metabolic encephalopathy: Principal | ICD-10-CM

## 2020-10-18 DIAGNOSIS — F0281 Dementia in other diseases classified elsewhere with behavioral disturbance: Secondary | ICD-10-CM | POA: Diagnosis present

## 2020-10-18 DIAGNOSIS — R410 Disorientation, unspecified: Secondary | ICD-10-CM | POA: Diagnosis present

## 2020-10-18 DIAGNOSIS — F05 Delirium due to known physiological condition: Secondary | ICD-10-CM | POA: Diagnosis present

## 2020-10-18 DIAGNOSIS — E669 Obesity, unspecified: Secondary | ICD-10-CM | POA: Diagnosis present

## 2020-10-18 DIAGNOSIS — N3000 Acute cystitis without hematuria: Secondary | ICD-10-CM | POA: Diagnosis not present

## 2020-10-18 DIAGNOSIS — Z66 Do not resuscitate: Secondary | ICD-10-CM | POA: Diagnosis present

## 2020-10-18 DIAGNOSIS — L89152 Pressure ulcer of sacral region, stage 2: Secondary | ICD-10-CM | POA: Diagnosis present

## 2020-10-18 DIAGNOSIS — I10 Essential (primary) hypertension: Secondary | ICD-10-CM | POA: Diagnosis present

## 2020-10-18 DIAGNOSIS — E785 Hyperlipidemia, unspecified: Secondary | ICD-10-CM | POA: Diagnosis present

## 2020-10-18 DIAGNOSIS — K219 Gastro-esophageal reflux disease without esophagitis: Secondary | ICD-10-CM | POA: Diagnosis present

## 2020-10-18 LAB — CBC
HCT: 40.4 % (ref 36.0–46.0)
Hemoglobin: 12.8 g/dL (ref 12.0–15.0)
MCH: 27.6 pg (ref 26.0–34.0)
MCHC: 31.7 g/dL (ref 30.0–36.0)
MCV: 87.3 fL (ref 80.0–100.0)
Platelets: 140 10*3/uL — ABNORMAL LOW (ref 150–400)
RBC: 4.63 MIL/uL (ref 3.87–5.11)
RDW: 14.6 % (ref 11.5–15.5)
WBC: 4.4 10*3/uL (ref 4.0–10.5)
nRBC: 0 % (ref 0.0–0.2)

## 2020-10-18 LAB — BASIC METABOLIC PANEL
Anion gap: 6 (ref 5–15)
BUN: 14 mg/dL (ref 8–23)
CO2: 27 mmol/L (ref 22–32)
Calcium: 8.8 mg/dL — ABNORMAL LOW (ref 8.9–10.3)
Chloride: 109 mmol/L (ref 98–111)
Creatinine, Ser: 0.4 mg/dL — ABNORMAL LOW (ref 0.44–1.00)
GFR, Estimated: >60 mL/min (ref >60)
Glucose, Bld: 86 mg/dL (ref 70–99)
Potassium: 3.9 mmol/L (ref 3.5–5.1)
Sodium: 142 mmol/L (ref 135–145)

## 2020-10-18 LAB — SARS CORONAVIRUS 2 (TAT 6-24 HRS): SARS Coronavirus 2: NEGATIVE

## 2020-10-18 LAB — TSH: TSH: 3.154 u[IU]/mL (ref 0.350–4.500)

## 2020-10-18 NOTE — ED Notes (Signed)
Rachael Fee, NP notified of pt's HR, no new orders at this time. Will continue to monitor.

## 2020-10-18 NOTE — Progress Notes (Signed)
TOC consult for SNF placement. TOC to follow up after PT/OT evals.  Sarah Donaldson, [place]

## 2020-10-18 NOTE — ED Notes (Signed)
Pt placed in yellow socks, fall mat on bed and alarm on. Fall bracelet in place and floor mats placed around pts bed.

## 2020-10-18 NOTE — ED Notes (Addendum)
Discussed with charge RN, pt no longer 1:1 at this time d/t sleeping since haldol administration. Pt continues to be on the monitors in a room near the nurse's station with high fall risk bundle implemented. DNR and fall risk armband placed on left wrist.

## 2020-10-18 NOTE — Plan of Care (Signed)
  Problem: Education: Goal: Knowledge of General Education information will improve Description Including pain rating scale, medication(s)/side effects and non-pharmacologic comfort measures Outcome: Progressing   

## 2020-10-18 NOTE — Plan of Care (Signed)
  Problem: Education: Goal: Knowledge of General Education information will improve Description: Including pain rating scale, medication(s)/side effects and non-pharmacologic comfort measures 10/18/2020 2356 by Frazier Butt, RN Outcome: Progressing 10/18/2020 2352 by Frazier Butt, RN Outcome: Progressing

## 2020-10-18 NOTE — ED Notes (Signed)
Received report from night shift RN at this time. Patient currently lying comfortably, but very drowsy. Will attempt to arose to give medications. Will continue to monitor and assess.

## 2020-10-18 NOTE — Progress Notes (Signed)
PROGRESS NOTE    Sarah Donaldson  AYT:[id number] DOB: Oct 15, 1950 DOA: 10/17/2020 PCP: Cletis Athens, MD   Chief complaint.  Altered mental status. Brief Narrative:  Sarah Donaldson is a 70 y.o. female with medical history significant for Parkinson's, dementia, Parkinson's related delirium, sundowning, depression/anxiety, presents to the emergency department for chief concerns of dark-colored urine and worsening delirium.  He is diagnosed with acute UTI, was placed on Rocephin.   Assessment & Plan:   Active Problems:   Clinical depression   Parkinson's disease ([place])   Sleep behavior disorder, REM   Obesity   Psychosis ([place])   Altered mental status   Dementia due to Parkinson's disease with behavioral disturbance ([place])   Essential hypertension   UTI (urinary tract infection)   SunDown syndrome   Acute metabolic encephalopathy  #1.  Acute metabolic encephalopathy. Parkinson dementia with psychosis. Acute urinary tract infection. Patient had a worsening mental status at time of admission, she is treated with Rocephin.  Currently mental status is better. Urine culture still pending.  Continue Rocephin.  2.  Non-Hodgkin lymphoma. Outpatient follow-up.  #3.  Essential hypertension. Continue home medicines.    DVT prophylaxis: Lovenox Code Status: DNR Family Communication:  Disposition Plan:  .   Status is: Inpatient  Remains inpatient appropriate because:Inpatient level of care appropriate due to severity of illness   Dispo: The patient is from: SNF              Anticipated d/c is to: SNF              Patient currently is not medically stable to d/c.   Difficult to place patient No        I/O last 3 completed shifts: In: 100 [IV Piggyback:100] Out: -  No intake/output data recorded.     Consultants:   None  Procedures: None  Antimicrobials:   Rocephin. Subjective: Patient seemed to have some confusion today, but no agitation. No fever  chills. No abdominal pain or nausea vomiting. No short of breath or cough.  Objective: Vitals:   10/18/20 0930 10/18/20 1030 10/18/20 1200 10/18/20 1402  BP: (!) 152/81 126/74 136/82 127/78  Pulse: 60 (!) 54 (!) 56 71  Resp: 12 10 12 16   Temp:    98.1 F (36.7 C)  TempSrc:      SpO2: 100% 100% 100% 97%  Weight:      Height:        Intake/Output Summary (Last 24 hours) at 10/18/2020 1522 Last data filed at 10/17/2020 1922 Gross per 24 hour  Intake 100 ml  Output --  Net 100 ml   Filed Weights   10/17/20 1752  Weight: 64.7 kg    Examination:  General exam: Appears calm and comfortable  Respiratory system: Clear to auscultation. Respiratory effort normal. Cardiovascular system: S1 & S2 heard, RRR. No JVD, murmurs, rubs, gallops or clicks. No pedal edema. Gastrointestinal system: Abdomen is nondistended, soft and nontender. No organomegaly or masses felt. Normal bowel sounds heard. Central nervous system: Alert and oriented x1,  No focal neurological deficits. Extremities: Symmetric 5 x 5 power. Skin: No rashes, lesions or ulcers     Data Reviewed: I have personally reviewed following labs and imaging studies  CBC: Recent Labs  Lab 10/17/20 1812 10/18/20 0626  WBC 5.4 4.4  NEUTROABS 3.2  --   HGB 12.2 12.8  HCT 38.9 40.4  MCV 88.6 87.3  PLT 132* 355*   Basic Metabolic Panel: Recent Labs  Lab 10/17/20 1812 10/18/20 0626  NA 140 142  K 3.9 3.9  CL 108 109  CO2 26 27  GLUCOSE 92 86  BUN 19 14  CREATININE 0.46 0.40*  CALCIUM 8.6* 8.8*   GFR: Estimated Creatinine Clearance: 62.7 mL/min (A) (by C-G formula based on SCr of 0.4 mg/dL (L)). Liver Function Tests: No results for input(s): AST, ALT, ALKPHOS, BILITOT, PROT, ALBUMIN in the last 168 hours. No results for input(s): LIPASE, AMYLASE in the last 168 hours. No results for input(s): AMMONIA in the last 168 hours. Coagulation Profile: No results for input(s): INR, PROTIME in the last 168  hours. Cardiac Enzymes: No results for input(s): CKTOTAL, CKMB, CKMBINDEX, TROPONINI in the last 168 hours. BNP (last 3 results) No results for input(s): PROBNP in the last 8760 hours. HbA1C: No results for input(s): HGBA1C in the last 72 hours. CBG: No results for input(s): GLUCAP in the last 168 hours. Lipid Profile: No results for input(s): CHOL, HDL, LDLCALC, TRIG, CHOLHDL, LDLDIRECT in the last 72 hours. Thyroid Function Tests: Recent Labs    10/18/20 0626  TSH 3.154   Anemia Panel: No results for input(s): VITAMINB12, FOLATE, FERRITIN, TIBC, IRON, RETICCTPCT in the last 72 hours. Sepsis Labs: No results for input(s): PROCALCITON, LATICACIDVEN in the last 168 hours.  Recent Results (from the past 240 hour(s))  Resp Panel by RT-PCR (Flu A&B, Covid) Nasopharyngeal Swab     Status: None   Collection Time: 10/08/20  7:13 PM   Specimen: Nasopharyngeal Swab; Nasopharyngeal(NP) swabs in vial transport medium  Result Value Ref Range Status   SARS Coronavirus 2 by RT PCR NEGATIVE NEGATIVE Final    Comment: (NOTE) SARS-CoV-2 target nucleic acids are NOT DETECTED.  The SARS-CoV-2 RNA is generally detectable in upper respiratory specimens during the acute phase of infection. The lowest concentration of SARS-CoV-2 viral copies this assay can detect is 138 copies/mL. A negative result does not preclude SARS-Cov-2 infection and should not be used as the sole basis for treatment or other patient management decisions. A negative result may occur with  improper specimen collection/handling, submission of specimen other than nasopharyngeal swab, presence of viral mutation(s) within the areas targeted by this assay, and inadequate number of viral copies(<138 copies/mL). A negative result must be combined with clinical observations, patient history, and epidemiological information. The expected result is Negative.  Fact Sheet for Patients:   EntrepreneurPulse.com.au  Fact Sheet for Healthcare Providers:  IncredibleEmployment.be  This test is no t yet approved or cleared by the [place] FDA and  has been authorized for detection and/or diagnosis of SARS-CoV-2 by FDA under an Emergency Use Authorization (EUA). This EUA will remain  in effect (meaning this test can be used) for the duration of the COVID-19 declaration under Section 564(b)(1) of the Act, 21 U.S.C.section 360bbb-3(b)(1), unless the authorization is terminated  or revoked sooner.       Influenza A by PCR NEGATIVE NEGATIVE Final   Influenza B by PCR NEGATIVE NEGATIVE Final    Comment: (NOTE) The Xpert Xpress SARS-CoV-2/FLU/RSV plus assay is intended as an aid in the diagnosis of influenza from Nasopharyngeal swab specimens and should not be used as a sole basis for treatment. Nasal washings and aspirates are unacceptable for Xpert Xpress SARS-CoV-2/FLU/RSV testing.  Fact Sheet for Patients: EntrepreneurPulse.com.au  Fact Sheet for Healthcare Providers: IncredibleEmployment.be  This test is not yet approved or cleared by the [place] FDA and has been authorized for detection and/or diagnosis of SARS-CoV-2 by FDA under  an Emergency Use Authorization (EUA). This EUA will remain in effect (meaning this test can be used) for the duration of the COVID-19 declaration under Section 564(b)(1) of the Act, 21 U.S.C. section 360bbb-3(b)(1), unless the authorization is terminated or revoked.  Performed at [facility], [place], [place] [address]   SARS CORONAVIRUS 2 (TAT 6-24 HRS) Nasopharyngeal Nasopharyngeal Swab     Status: None   Collection Time: 10/17/20  8:57 PM   Specimen: Nasopharyngeal Swab  Result Value Ref Range Status   SARS Coronavirus 2 NEGATIVE NEGATIVE Final    Comment: (NOTE) SARS-CoV-2 target nucleic acids are NOT DETECTED.  The  SARS-CoV-2 RNA is generally detectable in upper and lower respiratory specimens during the acute phase of infection. Negative results do not preclude SARS-CoV-2 infection, do not rule out co-infections with other pathogens, and should not be used as the sole basis for treatment or other patient management decisions. Negative results must be combined with clinical observations, patient history, and epidemiological information. The expected result is Negative.  Fact Sheet for Patients: SugarRoll.be  Fact Sheet for Healthcare Providers: https://www.woods-mathews.com/  This test is not yet approved or cleared by the [place] FDA and  has been authorized for detection and/or diagnosis of SARS-CoV-2 by FDA under an Emergency Use Authorization (EUA). This EUA will remain  in effect (meaning this test can be used) for the duration of the COVID-19 declaration under Se ction 564(b)(1) of the Act, 21 U.S.C. section 360bbb-3(b)(1), unless the authorization is terminated or revoked sooner.  Performed at [facility], [place] [address] [place], [place] [address]          Radiology Studies: No results found.      Scheduled Meds: . carbidopa-levodopa  2 tablet Oral 7 times per day  . clonazePAM  1 mg Oral QID  . enoxaparin (LOVENOX) injection  40 mg Subcutaneous Q24H  . entacapone  200 mg Oral 7 times per day  . escitalopram  10 mg Oral Daily  . pantoprazole  40 mg Oral Daily  . pregabalin  100 mg Oral TID  . QUEtiapine  100 mg Oral QHS  . traZODone  50 mg Oral QHS   Continuous Infusions: . cefTRIAXone (ROCEPHIN)  IV       LOS: 0 days    Time spent: 27 minutes    Sharen Hones, MD [facility]   To contact the attending provider between 7A-7P or the covering provider during after hours 7P-7A, please log into the web site [email or web address] and access using universal [place] password for that web site. If you do not  have the password, please call the hospital operator.  10/18/2020, 3:22 PM

## 2020-10-18 NOTE — ED Notes (Addendum)
Family came to bedside and then reported they had to go to appointments today. Best contact for updates is emergency contact Lanae Crumbly) cell number

## 2020-10-19 DIAGNOSIS — N3000 Acute cystitis without hematuria: Secondary | ICD-10-CM

## 2020-10-19 DIAGNOSIS — L8990 Pressure ulcer of unspecified site, unspecified stage: Secondary | ICD-10-CM | POA: Insufficient documentation

## 2020-10-19 DIAGNOSIS — G20 Parkinson's disease: Secondary | ICD-10-CM | POA: Diagnosis not present

## 2020-10-19 DIAGNOSIS — G9341 Metabolic encephalopathy: Secondary | ICD-10-CM | POA: Diagnosis not present

## 2020-10-19 DIAGNOSIS — F0281 Dementia in other diseases classified elsewhere with behavioral disturbance: Secondary | ICD-10-CM

## 2020-10-19 LAB — CBC WITH DIFFERENTIAL/PLATELET
Abs Immature Granulocytes: 0.01 10*3/uL (ref 0.00–0.07)
Basophils Absolute: 0 10*3/uL (ref 0.0–0.1)
Basophils Relative: 0 %
Eosinophils Absolute: 0.1 10*3/uL (ref 0.0–0.5)
Eosinophils Relative: 2 %
HCT: 40.1 % (ref 36.0–46.0)
Hemoglobin: 13 g/dL (ref 12.0–15.0)
Immature Granulocytes: 0 %
Lymphocytes Relative: 33 %
Lymphs Abs: 1.5 10*3/uL (ref 0.7–4.0)
MCH: 28.1 pg (ref 26.0–34.0)
MCHC: 32.4 g/dL (ref 30.0–36.0)
MCV: 86.6 fL (ref 80.0–100.0)
Monocytes Absolute: 0.5 10*3/uL (ref 0.1–1.0)
Monocytes Relative: 10 %
Neutro Abs: 2.5 10*3/uL (ref 1.7–7.7)
Neutrophils Relative %: 55 %
Platelets: 149 10*3/uL — ABNORMAL LOW (ref 150–400)
RBC: 4.63 MIL/uL (ref 3.87–5.11)
RDW: 14.9 % (ref 11.5–15.5)
WBC: 4.7 10*3/uL (ref 4.0–10.5)
nRBC: 0 % (ref 0.0–0.2)

## 2020-10-19 LAB — BASIC METABOLIC PANEL
Anion gap: 7 (ref 5–15)
BUN: 17 mg/dL (ref 8–23)
CO2: 25 mmol/L (ref 22–32)
Calcium: 8.6 mg/dL — ABNORMAL LOW (ref 8.9–10.3)
Chloride: 108 mmol/L (ref 98–111)
Creatinine, Ser: 0.41 mg/dL — ABNORMAL LOW (ref 0.44–1.00)
GFR, Estimated: >60 mL/min (ref >60)
Glucose, Bld: 88 mg/dL (ref 70–99)
Potassium: 3.7 mmol/L (ref 3.5–5.1)
Sodium: 140 mmol/L (ref 135–145)

## 2020-10-19 LAB — MRSA PCR SCREENING: MRSA by PCR: NEGATIVE

## 2020-10-19 LAB — MAGNESIUM: Magnesium: 2 mg/dL (ref 1.7–2.4)

## 2020-10-19 NOTE — Progress Notes (Signed)
PROGRESS NOTE    Sarah Donaldson  QIH:[id number] DOB: 04-07-1951 DOA: 10/17/2020 PCP: Cletis Athens, MD   Chief complaint.  Altered mental status. Brief Narrative:  Sarah Donaldson a 70 y.o.femalewith medical history significant forParkinson's, dementia, Parkinson's related delirium, sundowning, depression/anxiety, presents to the emergency department for chief concerns of dark-colored urine and worsening delirium.  He is diagnosed with acute UTI, was placed on Rocephin.   Assessment & Plan:   Active Problems:   Clinical depression   Parkinson's disease ([place])   Sleep behavior disorder, REM   Obesity   Psychosis ([place])   Altered mental status   Dementia due to Parkinson's disease with behavioral disturbance ([place])   Essential hypertension   UTI (urinary tract infection)   SunDown syndrome   Acute metabolic encephalopathy   Pressure injury of skin  #1.  Acute metabolic cephalopathy Parkinson dementia with psychosis. Acute urinary tract infection. Patient still confused, but no agitation.  Urine culture still pending, continue Rocephin.  2.  Non-Hodgkin lymphoma. Patient be followed by oncology as outpatient.  3.  Essential hypertension.  Continue home medicines.    DVT prophylaxis: Lovenox Code Status: DNR Family Communication: husband updated Disposition Plan:  .   Status is: Inpatient  Remains inpatient appropriate because:Inpatient level of care appropriate due to severity of illness   Dispo: The patient is from: SNF              Anticipated d/c is to: SNF              Patient currently is not medically stable to d/c.   Difficult to place patient No        I/O last 3 completed shifts: In: 38 [P.O.:360; IV Piggyback:100] Out: -  No intake/output data recorded.     Consultants:   none  Procedures: None  Antimicrobials: Rocephin  Subjective: Patient still very confused, but no agitation. She does not seem team pain.  She has no nausea  vomiting.  She has no fever or chills.  Objective: Vitals:   10/18/20 2110 10/19/20 0115 10/19/20 0454 10/19/20 0750  BP: 103/67 95/65 102/72 113/68  Pulse: 73 70 72 (!) 55  Resp: 16 16 16 18   Temp: 98.2 F (36.8 C) 98.2 F (36.8 C) 97.7 F (36.5 C) 97.6 F (36.4 C)  TempSrc: Oral Oral Oral Oral  SpO2: 94% 94% 94% 95%  Weight:      Height:        Intake/Output Summary (Last 24 hours) at 10/19/2020 0907 Last data filed at 10/19/2020 0100 Gross per 24 hour  Intake 360 ml  Output --  Net 360 ml   Filed Weights   10/17/20 1752  Weight: 64.7 kg    Examination:  General exam: Appears calm and comfortable  Respiratory system: Clear to auscultation. Respiratory effort normal. Cardiovascular system: S1 & S2 heard, RRR. No JVD, murmurs, rubs, gallops or clicks. No pedal edema. Gastrointestinal system: Abdomen is nondistended, soft and nontender. No organomegaly or masses felt. Normal bowel sounds heard. Central nervous system: Alert and oriented x1. No focal neurological deficits. Extremities: Symmetric 5 x 5 power. Skin: No rashes, lesions or ulcers     Data Reviewed: I have personally reviewed following labs and imaging studies  CBC: Recent Labs  Lab 10/17/20 1812 10/18/20 0626 10/19/20 0505  WBC 5.4 4.4 4.7  NEUTROABS 3.2  --  2.5  HGB 12.2 12.8 13.0  HCT 38.9 40.4 40.1  MCV 88.6 87.3 86.6  PLT 132* 140*  263*   Basic Metabolic Panel: Recent Labs  Lab 10/17/20 1812 10/18/20 0626 10/19/20 0505  NA 140 142 140  K 3.9 3.9 3.7  CL 108 109 108  CO2 26 27 25   GLUCOSE 92 86 88  BUN 19 14 17   CREATININE 0.46 0.40* 0.41*  CALCIUM 8.6* 8.8* 8.6*  MG  --   --  2.0   GFR: Estimated Creatinine Clearance: 62.7 mL/min (A) (by C-G formula based on SCr of 0.41 mg/dL (L)). Liver Function Tests: No results for input(s): AST, ALT, ALKPHOS, BILITOT, PROT, ALBUMIN in the last 168 hours. No results for input(s): LIPASE, AMYLASE in the last 168 hours. No results for  input(s): AMMONIA in the last 168 hours. Coagulation Profile: No results for input(s): INR, PROTIME in the last 168 hours. Cardiac Enzymes: No results for input(s): CKTOTAL, CKMB, CKMBINDEX, TROPONINI in the last 168 hours. BNP (last 3 results) No results for input(s): PROBNP in the last 8760 hours. HbA1C: No results for input(s): HGBA1C in the last 72 hours. CBG: No results for input(s): GLUCAP in the last 168 hours. Lipid Profile: No results for input(s): CHOL, HDL, LDLCALC, TRIG, CHOLHDL, LDLDIRECT in the last 72 hours. Thyroid Function Tests: Recent Labs    10/18/20 0626  TSH 3.154   Anemia Panel: No results for input(s): VITAMINB12, FOLATE, FERRITIN, TIBC, IRON, RETICCTPCT in the last 72 hours. Sepsis Labs: No results for input(s): PROCALCITON, LATICACIDVEN in the last 168 hours.  Recent Results (from the past 240 hour(s))  SARS CORONAVIRUS 2 (TAT 6-24 HRS) Nasopharyngeal Nasopharyngeal Swab     Status: None   Collection Time: 10/17/20  8:57 PM   Specimen: Nasopharyngeal Swab  Result Value Ref Range Status   SARS Coronavirus 2 NEGATIVE NEGATIVE Final    Comment: (NOTE) SARS-CoV-2 target nucleic acids are NOT DETECTED.  The SARS-CoV-2 RNA is generally detectable in upper and lower respiratory specimens during the acute phase of infection. Negative results do not preclude SARS-CoV-2 infection, do not rule out co-infections with other pathogens, and should not be used as the sole basis for treatment or other patient management decisions. Negative results must be combined with clinical observations, patient history, and epidemiological information. The expected result is Negative.  Fact Sheet for Patients: SugarRoll.be  Fact Sheet for Healthcare Providers: https://www.woods-mathews.com/  This test is not yet approved or cleared by the [place] FDA and  has been authorized for detection and/or diagnosis of SARS-CoV-2  by FDA under an Emergency Use Authorization (EUA). This EUA will remain  in effect (meaning this test can be used) for the duration of the COVID-19 declaration under Se ction 564(b)(1) of the Act, 21 U.S.C. section 360bbb-3(b)(1), unless the authorization is terminated or revoked sooner.  Performed at [facility], [place] [address], [place], [place] [address]          Radiology Studies: No results found.      Scheduled Meds: . carbidopa-levodopa  2 tablet Oral 7 times per day  . clonazePAM  1 mg Oral QID  . enoxaparin (LOVENOX) injection  40 mg Subcutaneous Q24H  . entacapone  200 mg Oral 7 times per day  . escitalopram  10 mg Oral Daily  . pantoprazole  40 mg Oral Daily  . pregabalin  100 mg Oral TID  . QUEtiapine  100 mg Oral QHS  . traZODone  50 mg Oral QHS   Continuous Infusions: . cefTRIAXone (ROCEPHIN)  IV 1 g (10/18/20 1636)     LOS: 1  day    Time spent: 27 minutes    Sharen Hones, MD [facility]   To contact the attending provider between 7A-7P or the covering provider during after hours 7P-7A, please log into the web site [email or web address] and access using universal [place] password for that web site. If you do not have the password, please call the hospital operator.  10/19/2020, 9:07 AM

## 2020-10-20 DIAGNOSIS — N3000 Acute cystitis without hematuria: Secondary | ICD-10-CM | POA: Diagnosis not present

## 2020-10-20 DIAGNOSIS — G9341 Metabolic encephalopathy: Secondary | ICD-10-CM | POA: Diagnosis not present

## 2020-10-20 DIAGNOSIS — G20 Parkinson's disease: Secondary | ICD-10-CM | POA: Diagnosis not present

## 2020-10-20 LAB — URINE CULTURE: Culture: >=100000 — AB

## 2020-10-20 MED ORDER — HALOPERIDOL 5 MG PO TABS
5.0000 mg | ORAL_TABLET | Freq: Once | ORAL | Status: AC
  Administered 2020-10-20: 5 mg via ORAL
  Filled 2020-10-20: qty 1

## 2020-10-20 MED ORDER — CEFDINIR 300 MG PO CAPS: 300.0000 mg | ORAL_CAPSULE | Freq: Two times a day (BID) | ORAL | 0 refills | Status: AC

## 2020-10-20 MED ORDER — HYDROCODONE-ACETAMINOPHEN 5-325 MG PO TABS: 1.0000 | ORAL_TABLET | Freq: Two times a day (BID) | ORAL | 0 refills | Status: AC | PRN

## 2020-10-20 MED ORDER — CLONAZEPAM 1 MG PO TABS: 0.5000 mg | ORAL_TABLET | Freq: Three times a day (TID) | ORAL | 0 refills | Status: DC

## 2020-10-20 MED ORDER — SENNOSIDES-DOCUSATE SODIUM 8.6-50 MG PO TABS: 2.0000 | ORAL_TABLET | Freq: Two times a day (BID) | ORAL | 0 refills | Status: AC

## 2020-10-20 MED ORDER — SENNOSIDES-DOCUSATE SODIUM 8.6-50 MG PO TABS
2.0000 | ORAL_TABLET | Freq: Two times a day (BID) | ORAL | Status: DC
  Administered 2020-10-20: 09:00:00 2 via ORAL
  Filled 2020-10-20: qty 2

## 2020-10-20 NOTE — Progress Notes (Signed)
Spouse is here to pick up patient, but patient is agitated and not cooperative. He is concerned that [facility] will not accept her agitated. Currently cannot give her prn med for agitation, IV cath already removed.  Notified Dr. Roosevelt Locks of the above.  Per MD to order Haldol 5mg  PO tab once and to discharge once she calm down.

## 2020-10-20 NOTE — Progress Notes (Signed)
Patient has been discharged. AVS, Rx and DNR given and explained to spouse, verbalized understanding.

## 2020-10-20 NOTE — Discharge Summary (Addendum)
Physician Discharge Summary  Patient ID: Sarah Donaldson MRN: [id number] DOB/AGE: 12-Jan-1951 70 y.o.  Admit date: 10/17/2020 Discharge date: 10/20/2020  Admission Diagnoses:  Discharge Diagnoses:  Active Problems:   Clinical depression   Parkinson's disease (Sarah Donaldson)   Sleep behavior disorder, REM   Obesity   Psychosis (Sarah Donaldson)   Altered mental status   Dementia due to Parkinson's disease with behavioral disturbance (Sarah Donaldson)   Essential hypertension   UTI (urinary tract infection)   Sarah Donaldson   Acute metabolic encephalopathy   Pressure injury of skin Sepsis is ruled out.  Discharged Condition: fair  Hospital Course:  Sarah Koffler Murrayis a 70 y.o.femalewith medical history significant forParkinson's, dementia, Parkinson's related delirium, sundowning, depression/anxiety, presents to the emergency department for chief concerns of dark-colored urine and worsening delirium.He is diagnosed with acute UTI, was placed on Rocephin.   #1.  Acute metabolic cephalopathy Parkinson dementia with psychosis. Acute urinary tract infection secondary to Proteus mirabilis. Patient still confused, but no agitation.    Urine culture growing Proteus mirabilis, susceptible to Rocephin, will continue cefdinir for another 5 days.  2.  Non-Hodgkin lymphoma. Patient be followed by oncology as outpatient.  3.  Essential hypertension.  Continue home medicines.  4. Pressure ulcers POA. RN follow in SNF.   Pressure Injury 10/18/20 Sacrum Left;Medial Stage 2 -  Partial thickness loss of dermis presenting as a shallow open injury with a red, pink wound bed without slough. two small stage 2 pressure injuries on sacrum with surrounding erythema (Active)  10/18/20 1709  Location: Sacrum  Location Orientation: Left;Medial  Staging: Stage 2 -  Partial thickness loss of dermis presenting as a shallow open injury with a red, pink wound bed without slough.  Wound Description (Comments): two small stage 2  pressure injuries on sacrum with surrounding erythema  Present on Admission: Yes       Patient was deemed to be high risk for taking benzodiazepine and pain medicine due to advanced age and multiple conditions.  I decreased the pain medicine as well as the diazepam dose.   Consults: None  Significant Diagnostic Studies:   Treatments: Rocephin  Discharge Exam: Blood pressure 115/83, pulse 72, temperature 98 F (36.7 C), resp. rate 17, height 5\' 7"  (1.702 m), weight 64.7 kg, SpO2 93 %. General appearance: alert, cooperative and confused Resp: clear to auscultation bilaterally Cardio: regular rate and rhythm, S1, S2 normal, no murmur, click, rub or gallop GI: soft, non-tender; bowel sounds normal; no masses,  no organomegaly Extremities: extremities normal, atraumatic, no cyanosis or edema  Disposition: Discharge disposition: 03-Skilled Nursing Facility       Discharge Instructions    Diet - low sodium heart healthy   Complete by: As directed    Discharge wound care:   Complete by: As directed    RN dressing change and manage   Increase activity slowly   Complete by: As directed      Allergies as of 10/20/2020      Reactions   Percocet [oxycodone-acetaminophen]    Hallucination   Baclofen    Caused numbness in mouth    Codeine Sulfate Nausea And Vomiting   Gabapentin Swelling   Mouth swelling    Terazosin    Tramadol Hcl    Interferes with Serotonin levels which affects her Parkinsons      Medication List    STOP taking these medications   ALPRAZolam 0.25 MG tablet Commonly known as: XANAX     TAKE these medications  acetaminophen 325 MG tablet Commonly known as: TYLENOL Take 650 mg by mouth every 4 (four) hours as needed for fever or mild pain.   carbidopa-levodopa 25-100 MG tablet Commonly known as: SINEMET IR Take 2 tablets by mouth every 2 (two) hours. (between the hours of 8AM and 8PM)   cefdinir 300 MG capsule Commonly known as:  OMNICEF Take 1 capsule (300 mg total) by mouth 2 (two) times daily for 5 days.   clonazePAM 1 MG tablet Commonly known as: KLONOPIN Take 0.5 tablets (0.5 mg total) by mouth in the morning, at noon, and at bedtime. What changed:   how much to take  when to take this   entacapone 200 MG tablet Commonly known as: COMTAN Take 200 mg by mouth 6 (six) times daily.   escitalopram 10 MG tablet Commonly known as: LEXAPRO Take 10 mg by mouth daily.   HYDROcodone-acetaminophen 5-325 MG tablet Commonly known as: NORCO/VICODIN Take 1 tablet by mouth 2 (two) times daily as needed for moderate pain. (take at lunch and bedtime) What changed:   when to take this  reasons to take this   omeprazole 10 MG capsule Commonly known as: PRILOSEC Take 10 mg by mouth daily.   potassium chloride 10 MEQ tablet Commonly known as: KLOR-CON Take 10 mEq by mouth daily.   prednisoLONE acetate 1 % ophthalmic suspension Commonly known as: PRED FORTE Place 1 drop into the left eye daily.   pregabalin 100 MG capsule Commonly known as: LYRICA Take 100 mg by mouth 3 (three) times daily.   QUEtiapine 100 MG tablet Commonly known as: SEROQUEL Take 1 tablet (100 mg total) by mouth at bedtime. What changed:   how much to take  when to take this   QUEtiapine 50 MG tablet Commonly known as: SEROQUEL Take 1 tablet (50 mg total) by mouth 3 (three) times daily. What changed:   how much to take  additional instructions   senna-docusate 8.6-50 MG tablet Commonly known as: Senokot-S Take 2 tablets by mouth 2 (two) times daily. Hold if loose stoold   traZODone 50 MG tablet Commonly known as: DESYREL Take 50 mg by mouth at bedtime.            Discharge Care Instructions  (From admission, onward)         Start     Ordered   10/20/20 0000  Discharge wound care:       Comments: RN dressing change and manage   10/20/20 0853          Follow-up Information    Cletis Athens, MD Follow up  in 1 week(s).   Specialty: Cardiology Contact information: [facility] [place] [address] [phone]               Signed: Sharen Hones 10/20/2020, 8:53 AM

## 2020-10-20 NOTE — Plan of Care (Signed)
  Problem: Education: Goal: Knowledge of General Education information will improve Description Including pain rating scale, medication(s)/side effects and non-pharmacologic comfort measures Outcome: Progressing   

## 2020-10-20 NOTE — TOC Transition Note (Signed)
Transition of Care [facility]) - CM/SW Discharge Note   Patient Details  Name: DAREEN GUTZWILLER MRN: [id number] Date of Birth: 10-17-1950  Transition of Care [facility]) CM/SW Contact:  Truitt Merle, LCSW Phone Number: 10/20/2020, 10:12 AM   Clinical Narrative:   Patient medically ready for discharge back to [facility] (ALF) today. Confirmed discharge plan with patient's spouse-[name] Manahan [phone]). Confirmed patient's returned with Althia Forts at [facility]. Patient returning to room 212 and report to be called to [phone]. Husband providing transportation for patient at discharge. If new Rx needed and ALF unable to pick up, husband willing to pick up from [facility] near [place]. Updated Dr. Roosevelt Locks and RN [facility] via secure chat. No additional TOC needs at this time. LCSW signing off.    Final next level of care: Assisted Living Barriers to Discharge: No Barriers Identified   Patient Goals and CMS Choice     Choice offered to / list presented to : NA  Discharge Placement                       Discharge Plan and Services In-house Referral: Clinical Social Work Discharge Planning Services: CM Consult Post Acute Care Choice: NA          DME Arranged: N/A DME Agency: NA       HH Arranged: NA HH Agency: NA        Social Determinants of Health (SDOH) Interventions     Readmission Risk Interventions No flowsheet data found.

## 2020-10-20 NOTE — Progress Notes (Signed)
Patient is being discharged to [facility].  Report given to [facility], [employer].  AVS, Rx and DNR placed in envelope for receiving facility.  Awaiting Spouse for transportation.

## 2020-10-24 ENCOUNTER — Emergency Department: Payer: Medicare Other

## 2020-10-24 ENCOUNTER — Emergency Department
Admission: EM | Admit: 2020-10-24 | Discharge: 2020-10-24 | Disposition: A | Discharge status: Home / Self Care | Payer: Medicare Other | Attending: Emergency Medicine | Admitting: Emergency Medicine

## 2020-10-24 DIAGNOSIS — G20 Parkinson's disease: Secondary | ICD-10-CM | POA: Diagnosis not present

## 2020-10-24 DIAGNOSIS — Z859 Personal history of malignant neoplasm, unspecified: Secondary | ICD-10-CM | POA: Insufficient documentation

## 2020-10-24 DIAGNOSIS — Z87891 Personal history of nicotine dependence: Secondary | ICD-10-CM | POA: Diagnosis not present

## 2020-10-24 DIAGNOSIS — I10 Essential (primary) hypertension: Secondary | ICD-10-CM | POA: Diagnosis not present

## 2020-10-24 DIAGNOSIS — R456 Violent behavior: Secondary | ICD-10-CM | POA: Insufficient documentation

## 2020-10-24 DIAGNOSIS — F0280 Dementia in other diseases classified elsewhere without behavioral disturbance: Secondary | ICD-10-CM | POA: Insufficient documentation

## 2020-10-24 DIAGNOSIS — R4689 Other symptoms and signs involving appearance and behavior: Secondary | ICD-10-CM

## 2020-10-24 DIAGNOSIS — Z96652 Presence of left artificial knee joint: Secondary | ICD-10-CM | POA: Insufficient documentation

## 2020-10-24 DIAGNOSIS — F0391 Unspecified dementia with behavioral disturbance: Secondary | ICD-10-CM | POA: Diagnosis not present

## 2020-10-24 LAB — COMPREHENSIVE METABOLIC PANEL
ALT: <5 U/L (ref 0–44)
AST: 11 U/L — ABNORMAL LOW (ref 15–41)
Albumin: 3.8 g/dL (ref 3.5–5.0)
Alkaline Phosphatase: 95 U/L (ref 38–126)
Anion gap: 7 (ref 5–15)
BUN: 19 mg/dL (ref 8–23)
CO2: 25 mmol/L (ref 22–32)
Calcium: 8.8 mg/dL — ABNORMAL LOW (ref 8.9–10.3)
Chloride: 109 mmol/L (ref 98–111)
Creatinine, Ser: 0.48 mg/dL (ref 0.44–1.00)
GFR, Estimated: >60 mL/min (ref >60)
Glucose, Bld: 121 mg/dL — ABNORMAL HIGH (ref 70–99)
Potassium: 3.4 mmol/L — ABNORMAL LOW (ref 3.5–5.1)
Sodium: 141 mmol/L (ref 135–145)
Total Bilirubin: 0.7 mg/dL (ref 0.3–1.2)
Total Protein: 6.1 g/dL — ABNORMAL LOW (ref 6.5–8.1)

## 2020-10-24 LAB — CBC WITH DIFFERENTIAL/PLATELET
Abs Immature Granulocytes: 0.02 10*3/uL (ref 0.00–0.07)
Basophils Absolute: 0 10*3/uL (ref 0.0–0.1)
Basophils Relative: 0 %
Eosinophils Absolute: 0.1 10*3/uL (ref 0.0–0.5)
Eosinophils Relative: 3 %
HCT: 38.5 % (ref 36.0–46.0)
Hemoglobin: 12.2 g/dL (ref 12.0–15.0)
Immature Granulocytes: 0 %
Lymphocytes Relative: 24 %
Lymphs Abs: 1.3 10*3/uL (ref 0.7–4.0)
MCH: 27.7 pg (ref 26.0–34.0)
MCHC: 31.7 g/dL (ref 30.0–36.0)
MCV: 87.3 fL (ref 80.0–100.0)
Monocytes Absolute: 0.5 10*3/uL (ref 0.1–1.0)
Monocytes Relative: 10 %
Neutro Abs: 3.4 10*3/uL (ref 1.7–7.7)
Neutrophils Relative %: 63 %
Platelets: 147 10*3/uL — ABNORMAL LOW (ref 150–400)
RBC: 4.41 MIL/uL (ref 3.87–5.11)
RDW: 14.5 % (ref 11.5–15.5)
WBC: 5.3 10*3/uL (ref 4.0–10.5)
nRBC: 0 % (ref 0.0–0.2)

## 2020-10-24 LAB — URINALYSIS, COMPLETE (UACMP) WITH MICROSCOPIC
Bacteria, UA: NONE SEEN
Bilirubin Urine: NEGATIVE
Glucose, UA: NEGATIVE mg/dL
Hgb urine dipstick: NEGATIVE
Ketones, ur: 20 mg/dL — AB
Leukocytes,Ua: NEGATIVE
Nitrite: NEGATIVE
Protein, ur: NEGATIVE mg/dL
Specific Gravity, Urine: 1.032 — ABNORMAL HIGH (ref 1.005–1.030)
pH: 5 (ref 5.0–8.0)

## 2020-10-24 NOTE — ED Provider Notes (Signed)
[facility] Emergency Department Provider Note ____________________________________________   Event Date/Time   First MD Initiated Contact with Patient 10/24/20 2123     (approximate)  I have reviewed the triage vital signs and the nursing notes.  HISTORY  Chief Complaint Aggressive Behavior (Pt coming from [place] [facility] [place] with complaints of aggressive behavior, pt combative towards staff. )   HPI Sarah Donaldson is a 70 y.o. femalewho presents to the ED for evaluation of combativeness.  Chart review indicates history of dementia and Parkinson's disease. Patient was recently admitted from 3/10-3/13 after a similar presentation to the ED for combativeness, attributed to a acute metabolic encephalopathy due to acute cystitis.  Patient presents to the ED today from her SNF due to couple hours of combativeness.  She was reportedly swatting away for medications and nurses at her facility, so they sent her to the ED for evaluation of another UTI.  Here in the ED, patient is unable to provide any relevant history due to her altered mentation and disorientation.  She denies pain anywhere.  Past Medical History:  Diagnosis Date  . Cancer ([place])    Non-hodgkin's lymphoma (in remission)  . Dementia ([place])   . GERD (gastroesophageal reflux disease)    well-controlled w/ omeprazole  . Hyperlipidemia   . Knee joint cyst, left 02/07/2018  . Parkinson disease ([place])   . Parkinson disease [facility])     Patient Active Problem List   Diagnosis Date Noted  . Pressure injury of skin 10/19/2020  . Acute metabolic encephalopathy 97/98/9211  . UTI (urinary tract infection) 10/17/2020  . SunDown syndrome 10/17/2020  . Essential hypertension 06/02/2020  . Need for influenza vaccination 06/02/2020  . Encounter for annual health check of caregiver 04/08/2020  . Altered mental status   . Dementia due to Parkinson's disease with behavioral disturbance ([place])   . SDH (subdural  hematoma) ([place]) 12/11/2019  . Falls frequently 12/11/2019  . S/P total knee replacement, left 06/07/2019  . Osteoarthritis of knee 04/27/2019  . Panic attacks 04/07/2019  . Psychosis ([place]) 04/04/2019  . Numbness 03/09/2018  . Lumbar spondylosis 03/07/2018  . Chronic pain of left knee 03/07/2018  . Chronic upper back pain 12/08/2017  . Benign neoplasm of hand 10/20/2017  . Chronic pain syndrome 10/20/2017  . Chronic pain of right lower extremity 10/20/2017  . Chronic myofascial pain 10/20/2017  . Numbness and tingling 07/12/2017  . Sleep difficulties 07/12/2017  . Low back pain 01/18/2017  . Sleep behavior disorder, REM 01/18/2017  . Lumbar radiculopathy 11/01/2016  . Follicular lymphoma of extranodal and solid organ sites [facility]) 05/01/2016  . Lymphoma ([place]) 04/30/2016  . Hallux limitus 07/27/2015  . Low back pain with sciatica 02/13/2015  . Bilateral low back pain with sciatica 02/13/2015  . H/O adenomatous polyp of colon 04/06/2014  . Hx of adenomatous colonic polyps 04/06/2014  . Back ache 01/25/2014  . Adiposity 01/25/2014  . REM sleep behavior disorder 01/25/2014  . Disordered sleep 01/25/2014  . Has a tremor 01/25/2014  . Obesity, unspecified 01/25/2014  . Sleep disorder 01/25/2014  . Backache 01/25/2014  . Tremor 01/25/2014  . Obesity 01/25/2014  . Difficulty hearing 06/08/2012  . Hearing loss 06/08/2012  . Anxiety disorder 06/06/2012  . Colon polyp 06/06/2012  . Clinical depression 06/06/2012  . Hypercholesteremia 06/06/2012  . Idiopathic Parkinson's disease ([place]) 06/06/2012  . Detached retina 06/06/2012  . Depressive disorder 06/06/2012  . Retinal detachment 06/06/2012  . Parkinson's disease ([place]) 06/06/2012  Past Surgical History:  Procedure Laterality Date  . BACK SURGERY    . BUNIONECTOMY    . NASAL SEPTUM SURGERY    . RETINAL DETACHMENT SURGERY    . TOTAL KNEE ARTHROPLASTY Left 06/07/2019   Procedure: TOTAL KNEE ARTHROPLASTY;  Surgeon: Lovell Sheehan, MD;  Location: [facility] ORS;  Service: Orthopedics;  Laterality: Left;    Prior to Admission medications   Medication Sig Start Date End Date Taking? Authorizing Provider  acetaminophen (TYLENOL) 325 MG tablet Take 650 mg by mouth every 4 (four) hours as needed for fever or mild pain.    [provider]  carbidopa-levodopa (SINEMET IR) 25-100 MG tablet Take 2 tablets by mouth every 2 (two) hours. (between the hours of 8AM and 8PM)    [provider]  cefdinir (OMNICEF) 300 MG capsule Take 1 capsule (300 mg total) by mouth 2 (two) times daily for 5 days. 10/20/20 10/25/20  Sharen Hones, MD  clonazePAM (KLONOPIN) 1 MG tablet Take 0.5 tablets (0.5 mg total) by mouth in the morning, at noon, and at bedtime. 10/20/20   Sharen Hones, MD  entacapone (COMTAN) 200 MG tablet Take 200 mg by mouth 6 (six) times daily.    [provider]  escitalopram (LEXAPRO) 10 MG tablet Take 10 mg by mouth daily.    [provider]  HYDROcodone-acetaminophen (NORCO/VICODIN) 5-325 MG tablet Take 1 tablet by mouth 2 (two) times daily as needed for moderate pain. (take at lunch and bedtime) 10/20/20   Sharen Hones, MD  omeprazole (PRILOSEC) 10 MG capsule Take 10 mg by mouth daily.    [provider]  potassium chloride (KLOR-CON) 10 MEQ tablet Take 10 mEq by mouth daily.    [provider]  prednisoLONE acetate (PRED FORTE) 1 % ophthalmic suspension Place 1 drop into the left eye daily.    [provider]  pregabalin (LYRICA) 100 MG capsule Take 100 mg by mouth 3 (three) times daily. 11/18/19   [provider]  QUEtiapine (SEROQUEL) 100 MG tablet Take 1 tablet (100 mg total) by mouth at bedtime. Patient taking differently: Take 50 mg by mouth every morning. 08/08/20   Clapacs, Madie Reno, MD  QUEtiapine (SEROQUEL) 50 MG tablet Take 1 tablet (50 mg total) by mouth 3 (three) times daily. Patient taking differently: Take 100 mg by mouth 3 (three) times daily.  (1200,1600,2000) 08/08/20   Clapacs, Madie Reno, MD  senna-docusate (SENOKOT-S) 8.6-50 MG tablet Take 2 tablets by mouth 2 (two) times daily. Hold if loose stoold 10/20/20   Sharen Hones, MD  traZODone (DESYREL) 50 MG tablet Take 50 mg by mouth at bedtime.    [provider]    Allergies Percocet [oxycodone-acetaminophen], Baclofen, Codeine sulfate, Gabapentin, Terazosin, and Tramadol hcl  Family History  Problem Relation Age of Onset  . Heart disease Mother   . Alcohol abuse Father   . Breast cancer Neg Hx   . Mental illness Neg Hx     Social History Social History   Tobacco Use  . Smoking status: Former Research scientist (life sciences)  . Smokeless tobacco: Never Used  Vaping Use  . Vaping Use: Never used  Substance Use Topics  . Alcohol use: Yes    Alcohol/week: 0.0 standard drinks  . Drug use: No    Review of Systems  Unable to be accurately assessed due to patient's disoriented status ____________________________________________   PHYSICAL EXAM:  VITAL SIGNS: Vitals:   10/24/20 2126 10/24/20 2200  BP: (!) 115/99 105/73  Pulse:  80 76  Resp: 16 16  Temp: 98.2 F (36.8 C)   SpO2: 96% 93%     Constitutional: Alert and pleasantly disoriented. Well appearing and in no acute distress. Eyes: Conjunctivae are normal. PERRL. EOMI. Head: Atraumatic. Nose: No congestion/rhinnorhea. Mouth/Throat: Mucous membranes are moist.  Oropharynx non-erythematous. Neck: No stridor. No cervical spine tenderness to palpation. Cardiovascular: Normal rate, regular rhythm. Grossly normal heart sounds.  Good peripheral circulation. Respiratory: Normal respiratory effort.  No retractions. Lungs CTAB. Gastrointestinal: Soft , nondistended, nontender to palpation. No CVA tenderness. Musculoskeletal: No lower extremity tenderness nor edema.  No joint effusions. No signs of acute trauma. Neurologic:  Normal speech and language. No gross focal neurologic deficits are appreciated.  Skin:  Skin is warm, dry and  intact. No rash noted. Psychiatric: Mood and affect are normal. Speech and behavior are normal.  ____________________________________________   LABS (all labs ordered are listed, but only abnormal results are displayed)  Labs Reviewed  CBC WITH DIFFERENTIAL/PLATELET - Abnormal; Notable for the following components:      Result Value   Platelets 147 (*)    All other components within normal limits  COMPREHENSIVE METABOLIC PANEL - Abnormal; Notable for the following components:   Potassium 3.4 (*)    Glucose, Bld 121 (*)    Calcium 8.8 (*)    Total Protein 6.1 (*)    AST 11 (*)    All other components within normal limits  URINALYSIS, COMPLETE (UACMP) WITH MICROSCOPIC - Abnormal; Notable for the following components:   Color, Urine AMBER (*)    APPearance CLEAR (*)    Specific Gravity, Urine 1.032 (*)    Ketones, ur 20 (*)    All other components within normal limits     ____________________________________________  RADIOLOGY  ED MD interpretation: 1 view CXR reviewed by me without evidence of acute cardiopulmonary pathology.  Official radiology report(s): DG Chest Portable 1 View  Result Date: 10/24/2020 CLINICAL DATA:  Behavior change. EXAM: PORTABLE CHEST 1 VIEW COMPARISON:  October 08, 2020 FINDINGS: Low lung volumes are seen. There is no evidence of acute infiltrate, pleural effusion or pneumothorax. The heart size and mediastinal contours are within normal limits. The visualized skeletal structures are unremarkable. IMPRESSION: No active disease. Electronically Signed   By: Virgina Norfolk M.D.   On: 10/24/2020 21:53    ____________________________________________   PROCEDURES and INTERVENTIONS  Procedure(s) performed (including Critical Care):  .1-3 Lead EKG Interpretation Performed by: Vladimir Crofts, MD Authorized by: Vladimir Crofts, MD     Interpretation: normal     ECG rate:  70   ECG rate assessment: normal     Rhythm: sinus rhythm     Ectopy: none      Conduction: normal      Medications - No data to display  ____________________________________________   MDM / ED COURSE   70 year old woman with severe dementia presents to the ED after some aggressive and combative behavior at her SNF, without evidence of medical pathology, and amenable to return to facility.  Normal vitals on room air.  Exam without any distress, agitation or aggressive behavior here in the ED.  She is pleasantly disoriented without neurovascular deficits.  Blood work is unremarkable and Urine is without infectious features.  CXR demonstrates no infiltrates.  She was observed in the ED for greater than an hour without any incidents and I see no barriers to return to facility and continued outpatient management.     ____________________________________________   FINAL CLINICAL IMPRESSION(S) /  ED DIAGNOSES  Final diagnoses:  Combative behavior     ED Discharge Orders    None       Jerman Tinnon Tamala Julian   Note:  This document was prepared using Dragon voice recognition software and may include unintentional dictation errors.   Vladimir Crofts, MD 10/24/20 2217

## 2020-10-24 NOTE — Discharge Instructions (Addendum)
No signs of UTI and her blood work was unremarkable.  Continue normal care and frequent redirection.

## 2020-10-24 NOTE — ED Notes (Signed)
Called [facility] to transport patient back to the [facility] @ 10:29pm

## 2020-10-24 NOTE — ED Notes (Signed)
Report called to [place], Therapist, sports at [facility].

## 2020-10-25 ENCOUNTER — Emergency Department
Admission: EM | Admit: 2020-10-25 | Discharge: 2020-10-28 | Disposition: A | Discharge status: Home / Self Care | Payer: Medicare Other | Attending: Emergency Medicine | Admitting: Emergency Medicine

## 2020-10-25 ENCOUNTER — Other Ambulatory Visit: Payer: Self-pay

## 2020-10-25 ENCOUNTER — Encounter: Payer: Self-pay | Admitting: Emergency Medicine

## 2020-10-25 DIAGNOSIS — Z87891 Personal history of nicotine dependence: Secondary | ICD-10-CM | POA: Insufficient documentation

## 2020-10-25 DIAGNOSIS — Z85831 Personal history of malignant neoplasm of soft tissue: Secondary | ICD-10-CM | POA: Diagnosis not present

## 2020-10-25 DIAGNOSIS — I10 Essential (primary) hypertension: Secondary | ICD-10-CM | POA: Diagnosis not present

## 2020-10-25 DIAGNOSIS — Z20822 Contact with and (suspected) exposure to covid-19: Secondary | ICD-10-CM | POA: Diagnosis not present

## 2020-10-25 DIAGNOSIS — Z79899 Other long term (current) drug therapy: Secondary | ICD-10-CM | POA: Insufficient documentation

## 2020-10-25 DIAGNOSIS — F0391 Unspecified dementia with behavioral disturbance: Secondary | ICD-10-CM

## 2020-10-25 DIAGNOSIS — F0281 Dementia in other diseases classified elsewhere with behavioral disturbance: Secondary | ICD-10-CM | POA: Insufficient documentation

## 2020-10-25 DIAGNOSIS — F331 Major depressive disorder, recurrent, moderate: Secondary | ICD-10-CM | POA: Insufficient documentation

## 2020-10-25 DIAGNOSIS — F0280 Dementia in other diseases classified elsewhere without behavioral disturbance: Secondary | ICD-10-CM

## 2020-10-25 DIAGNOSIS — R451 Restlessness and agitation: Secondary | ICD-10-CM | POA: Diagnosis not present

## 2020-10-25 DIAGNOSIS — Z96652 Presence of left artificial knee joint: Secondary | ICD-10-CM | POA: Insufficient documentation

## 2020-10-25 DIAGNOSIS — G20 Parkinson's disease: Secondary | ICD-10-CM | POA: Insufficient documentation

## 2020-10-25 DIAGNOSIS — F03911 Unspecified dementia, unspecified severity, with agitation: Secondary | ICD-10-CM

## 2020-10-25 LAB — RESP PANEL BY RT-PCR (FLU A&B, COVID) ARPGX2
Influenza A by PCR: NEGATIVE
Influenza B by PCR: NEGATIVE
SARS Coronavirus 2 by RT PCR: NEGATIVE

## 2020-10-25 MED ORDER — POTASSIUM CHLORIDE CRYS ER 10 MEQ PO TBCR
10.0000 meq | EXTENDED_RELEASE_TABLET | Freq: Every day | ORAL | Status: DC
  Administered 2020-10-25 – 2020-10-27 (×3): 10 meq via ORAL
  Filled 2020-10-25 (×4): qty 1

## 2020-10-25 MED ORDER — PREDNISOLONE ACETATE 1 % OP SUSP
1.0000 [drp] | Freq: Every day | OPHTHALMIC | Status: DC
  Administered 2020-10-26 – 2020-10-28 (×3): 1 [drp] via OPHTHALMIC
  Filled 2020-10-25 (×4): qty 5

## 2020-10-25 MED ORDER — ESCITALOPRAM OXALATE 10 MG PO TABS
10.0000 mg | ORAL_TABLET | Freq: Every day | ORAL | Status: DC
  Administered 2020-10-26 – 2020-10-28 (×3): 10 mg via ORAL
  Filled 2020-10-25 (×3): qty 1

## 2020-10-25 MED ORDER — PREGABALIN 50 MG PO CAPS
100.0000 mg | ORAL_CAPSULE | Freq: Three times a day (TID) | ORAL | Status: DC
  Administered 2020-10-25 – 2020-10-28 (×8): 100 mg via ORAL
  Filled 2020-10-25 (×8): qty 2

## 2020-10-25 MED ORDER — QUETIAPINE FUMARATE 25 MG PO TABS
100.0000 mg | ORAL_TABLET | Freq: Every day | ORAL | Status: DC
  Administered 2020-10-25 – 2020-10-27 (×3): 100 mg via ORAL
  Filled 2020-10-25 (×3): qty 4

## 2020-10-25 MED ORDER — CLONAZEPAM 0.5 MG PO TABS
0.5000 mg | ORAL_TABLET | Freq: Three times a day (TID) | ORAL | Status: DC
  Administered 2020-10-25 – 2020-10-28 (×8): 0.5 mg via ORAL
  Filled 2020-10-25 (×8): qty 1

## 2020-10-25 MED ORDER — ACETAMINOPHEN 325 MG PO TABS
650.0000 mg | ORAL_TABLET | ORAL | Status: DC | PRN
  Administered 2020-10-25 – 2020-10-26 (×2): 650 mg via ORAL
  Filled 2020-10-25 (×2): qty 2

## 2020-10-25 MED ORDER — CARBIDOPA-LEVODOPA 25-100 MG PO TABS
2.0000 | ORAL_TABLET | ORAL | Status: DC
  Administered 2020-10-25 – 2020-10-28 (×16): 2 via ORAL
  Filled 2020-10-25 (×28): qty 2

## 2020-10-25 MED ORDER — OLANZAPINE 5 MG PO TABS
5.0000 mg | ORAL_TABLET | Freq: Every day | ORAL | Status: DC | PRN
  Administered 2020-10-25: 5 mg via ORAL
  Filled 2020-10-25: qty 1

## 2020-10-25 MED ORDER — PANTOPRAZOLE SODIUM 40 MG PO TBEC
40.0000 mg | DELAYED_RELEASE_TABLET | Freq: Every day | ORAL | Status: DC
  Administered 2020-10-26 – 2020-10-28 (×3): 40 mg via ORAL
  Filled 2020-10-25 (×3): qty 1

## 2020-10-25 MED ORDER — ENTACAPONE 200 MG PO TABS
200.0000 mg | ORAL_TABLET | ORAL | Status: DC
  Administered 2020-10-25 – 2020-10-28 (×14): 200 mg via ORAL
  Filled 2020-10-25 (×25): qty 1

## 2020-10-25 MED ORDER — QUETIAPINE FUMARATE 25 MG PO TABS
50.0000 mg | ORAL_TABLET | Freq: Three times a day (TID) | ORAL | Status: DC
  Administered 2020-10-26 – 2020-10-28 (×6): 50 mg via ORAL
  Filled 2020-10-25 (×6): qty 2

## 2020-10-25 NOTE — ED Triage Notes (Signed)
Pt to ED via [facility] from [facility]. Per EMS pt sent for eval by staff and at the request of her husband. Per ems Pt's husband is requesting patient be placed at facility where patient can have 24/7 sitter with her and the [facility] is unable to provide for patient. Per EMS pt became upset after a visit from her husband and refused to take a medication. EMS reports pt's husband is currently attempting to take out IVC papers to keep patient at the hospital for 72 hrs. Pt with hx of parkinsons and dementia. Pt calm and cooperative with staff on arrival to ED.

## 2020-10-25 NOTE — ED Notes (Signed)
Call to lab for orders added

## 2020-10-25 NOTE — ED Notes (Signed)
Husband leaving bedside att, given pass code for updates and sheet for visitation and phone call, contacting [facility] for [facility]

## 2020-10-25 NOTE — ED Notes (Signed)
Husband at bedside, call to [facility], Librarian, academic at [place], pt attacking staff and spitting,

## 2020-10-25 NOTE — ED Notes (Signed)
Psych and TTS at bedside, husband at bedside still
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
## 2020-10-25 NOTE — ED Provider Notes (Signed)
[facility] Emergency Department Provider Note  ____________________________________________   Event Date/Time   First MD Initiated Contact with Patient 10/25/20 1855     (approximate)  I have reviewed the triage vital signs and the nursing notes.   HISTORY  Chief Complaint Psychiatric Evaluation    HPI Sarah Donaldson is a 70 y.o. female with parkingsons and dementia coming from [facility].  Husband is at bedside who provides the history.  According to him patient resides at [facility] which is a assisted living facility.  However she requires more care.  She has become very verbally aggressive and continuously attempted out of bed.  He was hoping that they can adjust her medications in order to help him calm down.  He also is working on getting her into a memory care unit unfortunately however there all year long wait has been having difficulty doing it.  On review of records, patient had a admission from 3 10-3 13 after similar presentation to the ED for combativeness attributed to acute metabolic encephalopathy due to acute cystitis.  She was seen here yesterday for continued agitation and had a urine that was negative and appeared to be at her baseline so patient was sent back to her facility.  Patient has baseline dementia therefore I am unable to get full HPI from patient    Past Medical History:  Diagnosis Date  . Cancer ([place])    Non-hodgkin's lymphoma (in remission)  . Dementia ([place])   . GERD (gastroesophageal reflux disease)    well-controlled w/ omeprazole  . Hyperlipidemia   . Knee joint cyst, left 02/07/2018  . Parkinson disease ([place])   . Parkinson disease [facility])     Patient Active Problem List   Diagnosis Date Noted  . Pressure injury of skin 10/19/2020  . Acute metabolic encephalopathy 09/32/3557  . UTI (urinary tract infection) 10/17/2020  . SunDown syndrome 10/17/2020  . Essential hypertension 06/02/2020  . Need for influenza  vaccination 06/02/2020  . Encounter for annual health check of caregiver 04/08/2020  . Altered mental status   . Dementia due to Parkinson's disease with behavioral disturbance ([place])   . SDH (subdural hematoma) ([place]) 12/11/2019  . Falls frequently 12/11/2019  . S/P total knee replacement, left 06/07/2019  . Osteoarthritis of knee 04/27/2019  . Panic attacks 04/07/2019  . Psychosis ([place]) 04/04/2019  . Numbness 03/09/2018  . Lumbar spondylosis 03/07/2018  . Chronic pain of left knee 03/07/2018  . Chronic upper back pain 12/08/2017  . Benign neoplasm of hand 10/20/2017  . Chronic pain syndrome 10/20/2017  . Chronic pain of right lower extremity 10/20/2017  . Chronic myofascial pain 10/20/2017  . Numbness and tingling 07/12/2017  . Sleep difficulties 07/12/2017  . Low back pain 01/18/2017  . Sleep behavior disorder, REM 01/18/2017  . Lumbar radiculopathy 11/01/2016  . Follicular lymphoma of extranodal and solid organ sites [facility]) 05/01/2016  . Lymphoma ([place]) 04/30/2016  . Hallux limitus 07/27/2015  . Low back pain with sciatica 02/13/2015  . Bilateral low back pain with sciatica 02/13/2015  . H/O adenomatous polyp of colon 04/06/2014  . Hx of adenomatous colonic polyps 04/06/2014  . Back ache 01/25/2014  . Adiposity 01/25/2014  . REM sleep behavior disorder 01/25/2014  . Disordered sleep 01/25/2014  . Has a tremor 01/25/2014  . Obesity, unspecified 01/25/2014  . Sleep disorder 01/25/2014  . Backache 01/25/2014  . Tremor 01/25/2014  . Obesity 01/25/2014  . Difficulty hearing 06/08/2012  . Hearing loss  06/08/2012  . Anxiety disorder 06/06/2012  . Colon polyp 06/06/2012  . Clinical depression 06/06/2012  . Hypercholesteremia 06/06/2012  . Idiopathic Parkinson's disease ([place]) 06/06/2012  . Detached retina 06/06/2012  . Depressive disorder 06/06/2012  . Retinal detachment 06/06/2012  . Parkinson's disease ([place]) 06/06/2012    Past Surgical History:  Procedure Laterality  Date  . BACK SURGERY    . BUNIONECTOMY    . NASAL SEPTUM SURGERY    . RETINAL DETACHMENT SURGERY    . TOTAL KNEE ARTHROPLASTY Left 06/07/2019   Procedure: TOTAL KNEE ARTHROPLASTY;  Surgeon: Lovell Sheehan, MD;  Location: [facility] ORS;  Service: Orthopedics;  Laterality: Left;    Prior to Admission medications   Medication Sig Start Date End Date Taking? Authorizing Provider  acetaminophen (TYLENOL) 325 MG tablet Take 650 mg by mouth every 4 (four) hours as needed for fever or mild pain.    [provider]  carbidopa-levodopa (SINEMET IR) 25-100 MG tablet Take 2 tablets by mouth every 2 (two) hours. (between the hours of 8AM and 8PM)    [provider]  cefdinir (OMNICEF) 300 MG capsule Take 1 capsule (300 mg total) by mouth 2 (two) times daily for 5 days. 10/20/20 10/25/20  Sharen Hones, MD  clonazePAM (KLONOPIN) 1 MG tablet Take 0.5 tablets (0.5 mg total) by mouth in the morning, at noon, and at bedtime. 10/20/20   Sharen Hones, MD  entacapone (COMTAN) 200 MG tablet Take 200 mg by mouth 6 (six) times daily.    [provider]  escitalopram (LEXAPRO) 10 MG tablet Take 10 mg by mouth daily.    [provider]  HYDROcodone-acetaminophen (NORCO/VICODIN) 5-325 MG tablet Take 1 tablet by mouth 2 (two) times daily as needed for moderate pain. (take at lunch and bedtime) 10/20/20   Sharen Hones, MD  omeprazole (PRILOSEC) 10 MG capsule Take 10 mg by mouth daily.    [provider]  potassium chloride (KLOR-CON) 10 MEQ tablet Take 10 mEq by mouth daily.    [provider]  prednisoLONE acetate (PRED FORTE) 1 % ophthalmic suspension Place 1 drop into the left eye daily.    [provider]  pregabalin (LYRICA) 100 MG capsule Take 100 mg by mouth 3 (three) times daily. 11/18/19   [provider]  QUEtiapine (SEROQUEL) 100 MG tablet Take 1 tablet (100 mg total) by mouth at bedtime. Patient taking differently: Take 50 mg by mouth every morning.  08/08/20   Clapacs, Madie Reno, MD  QUEtiapine (SEROQUEL) 50 MG tablet Take 1 tablet (50 mg total) by mouth 3 (three) times daily. Patient taking differently: Take 100 mg by mouth 3 (three) times daily. (1200,1600,2000) 08/08/20   Clapacs, Madie Reno, MD  senna-docusate (SENOKOT-S) 8.6-50 MG tablet Take 2 tablets by mouth 2 (two) times daily. Hold if loose stoold 10/20/20   Sharen Hones, MD  traZODone (DESYREL) 50 MG tablet Take 50 mg by mouth at bedtime.    [provider]    Allergies Percocet [oxycodone-acetaminophen], Baclofen, Codeine sulfate, Gabapentin, Terazosin, and Tramadol hcl  Family History  Problem Relation Age of Onset  . Heart disease Mother   . Alcohol abuse Father   . Breast cancer Neg Hx   . Mental illness Neg Hx     Social History Social History   Tobacco Use  . Smoking status: Former Research scientist (life sciences)  . Smokeless tobacco: Never Used  Vaping Use  . Vaping Use: Never used  Substance Use Topics  . Alcohol use: Yes  Alcohol/week: 0.0 standard drinks  . Drug use: No      Review of Systems Unable to get full review of system from patient due to his baseline dementia ____________________________________________   PHYSICAL EXAM:  VITAL SIGNS: ED Triage Vitals [10/25/20 1853]  Enc Vitals Group     BP      Pulse      Resp      Temp      Temp src      SpO2      Weight 140 lb (63.5 kg)     Height 5\' 7"  (1.702 m)     Head Circumference      Peak Flow      Pain Score      Pain Loc      Pain Edu?      Excl. in [place]?     Constitutional: Disheveled, looking around, mumbling Eyes: Conjunctivae are normal. No swelling around eyes Head: Atraumatic. Nose: No congestion/rhinnorhea. Mouth/Throat: Mucous membranes are moist.   Neck: No stridor. Trachea Midline. FROM Cardiovascular: Normal rate, no swelling noted Respiratory: No increased wob, no stridor Gastrointestinal: Soft and nontender. No distention. No abdominal bruits.  Musculoskeletal: No lower  extremity tenderness nor edema.  No joint effusions.  Moving all extremities. Neurologic: Difficult to understand patient but seems to be at baseline according to family.  Some contractures noted.  However moving all extremities. Skin:  Skin is warm, dry and intact. No rash noted. Psychiatric: Unable to fully assess due to baseline dementia GU: Deferred   ____________________________________________   LABS (all labs ordered are listed, but only abnormal results are displayed)  Labs Reviewed  RESP PANEL BY RT-PCR (FLU A&B, COVID) [id number]   ____________________________________________  RADIOLOGY Robert Bellow, personally viewed and evaluated these images (plain radiographs) as part of my medical decision making, as well as reviewing the written report by the radiologist.  ED MD interpretation: No pneumonia  Official radiology report(s): DG Chest Portable 1 View  Result Date: 10/24/2020 CLINICAL DATA:  Behavior change. EXAM: PORTABLE CHEST 1 VIEW COMPARISON:  October 08, 2020 FINDINGS: Low lung volumes are seen. There is no evidence of acute infiltrate, pleural effusion or pneumothorax. The heart size and mediastinal contours are within normal limits. The visualized skeletal structures are unremarkable. IMPRESSION: No active disease. Electronically Signed   By: Virgina Norfolk M.D.   On: 10/24/2020 21:53    ____________________________________________   PROCEDURES  Procedure(s) performed (including Critical Care):  Procedures   ____________________________________________   INITIAL IMPRESSION / ASSESSMENT AND PLAN / ED COURSE  Sarah Donaldson was evaluated in Emergency Department on 10/25/2020 for the symptoms described in the history of present illness. She was evaluated in the context of the global COVID-19 pandemic, which necessitated consideration that the patient might be at risk for infection with the SARS-CoV-2 virus that causes COVID-19. Institutional protocols and  algorithms that pertain to the evaluation of patients at risk for COVID-19 are in a state of rapid change based on information released by regulatory bodies including the CDC and federal and state organizations. These policies and algorithms were followed during the patient's care in the ED.    Patient is a 70 year old who comes in for increasing agitations related to her Parkinson's and dementia.  Patient already had work-up done yesterday with normal labs so no evidence of infection, UTI, anemia.  No trauma suggest intracranial hemorrhage.  Unfortunately patient has many ER visits for similar and is typically given some sedating medicine  and sent back to facility.  She had one admission for UTI but no evidence of that this time.  However unfortunately at this time the facility is not willing to take patient back and husband does not want patient to go back.  He has been trying to find other placement but is having difficulties.  We will consult psychiatry to see if they have any other recommendations for adjustment of her medications since she has been on Seroquel and looks that this was prescribed by Dr. Weber Cooks.  Psychiatry saw patient and will try some Zyprexa.  I restarted all of her home medications.  I consulted social work.  ____________________________________________   FINAL CLINICAL IMPRESSION(S) / ED DIAGNOSES   Final diagnoses:  Parkinson's disease ([place])  Agitation      MEDICATIONS GIVEN DURING THIS VISIT:  Medications  OLANZapine (ZYPREXA) tablet 5 mg (5 mg Oral Given 10/25/20 2207)  acetaminophen (TYLENOL) tablet 650 mg (650 mg Oral Given 10/25/20 2206)  carbidopa-levodopa (SINEMET IR) 25-100 MG per tablet immediate release 2 tablet (2 tablets Oral Given 10/25/20 2207)  clonazePAM (KLONOPIN) tablet 0.5 mg (0.5 mg Oral Given 10/25/20 2227)  entacapone (COMTAN) tablet 200 mg (200 mg Oral Given 10/25/20 2228)  escitalopram (LEXAPRO) tablet 10 mg (10 mg Oral Not Given 10/25/20  2226)  pantoprazole (PROTONIX) EC tablet 40 mg (40 mg Oral Not Given 10/25/20 2226)  potassium chloride (KLOR-CON) CR tablet 10 mEq (10 mEq Oral Given 10/25/20 2228)  prednisoLONE acetate (PRED FORTE) 1 % ophthalmic suspension 1 drop (1 drop Left Eye Not Given 10/25/20 2227)  pregabalin (LYRICA) capsule 100 mg (100 mg Oral Given 10/25/20 2206)  QUEtiapine (SEROQUEL) tablet 50 mg (50 mg Oral Not Given 10/25/20 2225)  QUEtiapine (SEROQUEL) tablet 100 mg (100 mg Oral Given 10/25/20 2205)     ED Discharge Orders    None       Note:  This document was prepared using Dragon voice recognition software and may include unintentional dictation errors.   Vanessa [place], MD 10/25/20 2356

## 2020-10-25 NOTE — ED Notes (Signed)
Call to Karmyn Lowman, husband, [phone], call to bring back to talk to EDP and access dynamic with pt

## 2020-10-26 DIAGNOSIS — G20 Parkinson's disease: Secondary | ICD-10-CM | POA: Diagnosis not present

## 2020-10-26 MED ORDER — DROPERIDOL 2.5 MG/ML IJ SOLN
5.0000 mg | Freq: Once | INTRAMUSCULAR | Status: AC | PRN
  Administered 2020-10-26: 5 mg via INTRAMUSCULAR
  Filled 2020-10-26: qty 2

## 2020-10-26 NOTE — ED Notes (Signed)
Pt appears with eyes closed, even and unlabored respirations, pt pants off, three blankets, eye glasses on

## 2020-10-26 NOTE — ED Notes (Signed)
Pt's husband at bedside reports he came to see his wife, reports since wife is resting and sleeping he is going to go home. This RN informed pt's wife that pt took her medications at 4pm , and was able to move side to side and used bed pan with no difficulty. Pt's husband left pt's room
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
## 2020-10-26 NOTE — TOC Progression Note (Signed)
Transition of Care [facility]) - Progression Note    Patient Details  Name: Sarah Donaldson MRN: [id number] Date of Birth: 06/29/1951  Transition of Care [facility]) CM/SW [place], LCSW Phone Number: 10/26/2020, 11:15 AM  Clinical Narrative:   Social worker attempted to call the patient's husband, Sarah Donaldson, [phone]. Left message on voice mail.    Expected Discharge Plan: Memory Care Barriers to Discharge: Other (comment)  Expected Discharge Plan and Services Expected Discharge Plan: Memory Care    Social Determinants of Health (SDOH) Interventions    Readmission Risk Interventions No flowsheet data found.

## 2020-10-26 NOTE — ED Notes (Signed)
Pt medications crushed and placed in applesauce- pt able to swallow applesauce with no problems

## 2020-10-26 NOTE — ED Notes (Signed)
Pt very agitated screaming loudly

## 2020-10-26 NOTE — ED Provider Notes (Signed)
-----------------------------------------   6:14 AM on 10/26/2020 -----------------------------------------  The patient had an episode of severe agitation and aggression earlier during my shift.  She was screaming and fighting with staff, trying to throw herself out of her bed, and was representing an immediate danger to herself and others.  She was completely nonredirectable and seems to be primarily the results of dementia with behavioral disturbance.  She required a brief physical hold and I authorize administration of droperidol 5 mg intramuscular.  This eventually worked as a Scientist, physiological and she has been sleeping since that time.  I briefly spoke with Rashaun with behavioral medicine in person about the situation.  She feels that the patient does not qualify for geropsych admission because of her dementia and that she will need social work assistance to be placed in a dementia unit.  Transition of care consult is pending.   Hinda Kehr, MD 10/26/20 [phone]

## 2020-10-26 NOTE — ED Notes (Signed)
Pt complaining of back pain- will give PRN tylenol

## 2020-10-26 NOTE — ED Notes (Addendum)
Pt continues to scream loudly, mostly unintelligible, unable to calm pt  Meagan, RN, was able to administer IM medication without holding pt - EDP placed restraint order in case of necessity

## 2020-10-26 NOTE — ED Notes (Signed)
Pt too drowsy to take medications- will try again later

## 2020-10-26 NOTE — ED Notes (Signed)
Pt used bedpan able to turn side to side urine output 289ml

## 2020-10-26 NOTE — ED Notes (Signed)
HOB reclined, pillow adjusted, fluids consumed, pt offered night snack, toilet or further intervention -refused

## 2020-10-26 NOTE — ED Notes (Signed)
Update given to husband.

## 2020-10-26 NOTE — ED Notes (Signed)
Repositioned pt in bed, pt  Using bedpan wanted a few minutes to use it.

## 2020-10-26 NOTE — ED Notes (Signed)
Pt and sheets saturated with urine- pt cleaned, changed into clean scrubs, and placed in brief, linens changed

## 2020-10-26 NOTE — ED Notes (Signed)
Repositioned pt in bed and elevated head of bed, proved pt with food tray and cranberry juice. Pt feeding herself no other needs at this time

## 2020-10-27 DIAGNOSIS — G20 Parkinson's disease: Secondary | ICD-10-CM | POA: Diagnosis not present

## 2020-10-27 NOTE — ED Notes (Signed)
Pharmacy called about missing meds.  

## 2020-10-27 NOTE — ED Notes (Signed)
Pt remains dry at this time. Denies further needs.

## 2020-10-27 NOTE — ED Notes (Signed)
Pt assisted to bedside commode with x2 assist for BM.

## 2020-10-27 NOTE — NC FL2 (Signed)
[place] LEVEL OF CARE SCREENING TOOL     IDENTIFICATION  Patient Name: Sarah Donaldson Birthdate: 03-08-1951 Sex: female Admission Date (Current Location): 10/25/2020  [place] and [place] Number:  Engineering geologist and Address:  [facility], [address] [place], [place] [address]      Provider Number: [id number]  Attending Physician Name and Address:  No att. providers found  Relative Name and Phone Number:  Teleah Villamar, spouse, [phone]    Current Level of Care: Domiciliary (Rest home) (ALF [facility]) Recommended Level of Care: Memory Care Prior Approval Number:    Date Approved/Denied:   PASRR Number: [id number]  Discharge Plan: Domiciliary (Rest home) [facility])    Current Diagnoses: Patient Active Problem List   Diagnosis Date Noted  . Agitation due to dementia ([place]) 10/25/2020  . Pressure injury of skin 10/19/2020  . Acute metabolic encephalopathy 15/40/0867  . UTI (urinary tract infection) 10/17/2020  . SunDown syndrome 10/17/2020  . Essential hypertension 06/02/2020  . Need for influenza vaccination 06/02/2020  . Encounter for annual health check of caregiver 04/08/2020  . Altered mental status   . 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[place])   . SDH (subdural hematoma) ([place]) 12/11/2019  . Falls frequently 12/11/2019  . S/P total knee replacement, left 06/07/2019  . Osteoarthritis of knee 04/27/2019  . Panic attacks 04/07/2019  . Psychosis ([place]) 04/04/2019  . Numbness 03/09/2018  . Lumbar spondylosis 03/07/2018  . Chronic pain of left knee 03/07/2018  . Chronic upper back pain 12/08/2017  . Benign neoplasm of hand 10/20/2017  . Chronic pain syndrome 10/20/2017  . Chronic pain of right lower extremity 10/20/2017  . Chronic myofascial pain 10/20/2017  . Numbness and tingling 07/12/2017  . Sleep difficulties 07/12/2017  . Low back pain 01/18/2017  . Sleep behavior disorder, REM 01/18/2017  .  Lumbar radiculopathy 11/01/2016  . Follicular lymphoma of extranodal and solid organ sites [facility]) 05/01/2016  . Lymphoma ([place]) 04/30/2016  . Hallux limitus 07/27/2015  . Low back pain with sciatica 02/13/2015  . Bilateral low back pain with sciatica 02/13/2015  . H/O adenomatous polyp of colon 04/06/2014  . Hx of adenomatous colonic polyps 04/06/2014  . Back ache 01/25/2014  . Adiposity 01/25/2014  . REM sleep behavior disorder 01/25/2014  . Disordered sleep 01/25/2014  . Has a tremor 01/25/2014  . Obesity, unspecified 01/25/2014  . Sleep disorder 01/25/2014  . Backache 01/25/2014  . Tremor 01/25/2014  . Obesity 01/25/2014  . Difficulty hearing 06/08/2012  . Hearing loss 06/08/2012  . Anxiety disorder 06/06/2012  . Colon polyp 06/06/2012  . Clinical depression 06/06/2012  . Hypercholesteremia 06/06/2012  . Idiopathic Parkinson's disease ([place]) 06/06/2012  . Detached retina 06/06/2012  . Depressive disorder 06/06/2012  . Retinal detachment 06/06/2012  . Parkinson's disease ([place]) 06/06/2012    Orientation RESPIRATION BLADDER Height & Weight     Self  Normal Continent Weight: 140 lb (63.5 kg) Height:  5\' 7"  (170.2 cm)  BEHAVIORAL SYMPTOMS/MOOD NEUROLOGICAL BOWEL NUTRITION STATUS  Other (Comment) (calm)   Continent Diet (Room service)  AMBULATORY STATUS COMMUNICATION OF NEEDS Skin   Independent Verbally Normal                       Personal Care Assistance Level of Assistance  Bathing,Feeding,Dressing Bathing Assistance: Independent Feeding assistance: Independent Dressing Assistance: Independent     Functional Limitations Info  Sight,Hearing,Speech Sight Info: Adequate Hearing Info: Adequate Speech Info: Adequate  SPECIAL CARE FACTORS FREQUENCY                       Contractures      Additional Factors Info  Code Status,Allergies,Psychotropic Code Status Info: DNR Allergies Info: Percocet (Oxycodone-acetaminophen), Baclofen, Codeine Sulfate,  Gabapentin, Terazosin, Tramadol Hcl Psychotropic Info: clonazePAM (KLONOPIN) tablet 0.5 mg, OLANZapine (ZYPREXA) tablet 5 mg, QUEtiapine (SEROQUEL) tablet 100 mg, QUEtiapine (SEROQUEL) tablet 50 mg, pregabalin (LYRICA) capsule 100 mg  PRN: OLANZapine (ZYPREXA) tablet 5 mg,         Current Medications (10/27/2020):  This is the current hospital active medication list Current Facility-Administered Medications  Medication Dose Route Frequency Provider Last Rate Last Admin  . acetaminophen (TYLENOL) tablet 650 mg  650 mg Oral Q4H PRN Vanessa [place], MD   650 mg at 10/26/20 1447  . carbidopa-levodopa (SINEMET IR) 25-100 MG per tablet immediate release 2 tablet  2 tablet Oral 7 times per day Vanessa [place], MD   2 tablet at 10/27/20 0800  . clonazePAM (KLONOPIN) tablet 0.5 mg  0.5 mg Oral TID Vanessa [place], MD   0.5 mg at 10/27/20 0759  . entacapone (COMTAN) tablet 200 mg  200 mg Oral 6 times per day Vanessa [place], MD   200 mg at 10/27/20 0800  . escitalopram (LEXAPRO) tablet 10 mg  10 mg Oral Daily Vanessa [place], MD   10 mg at 10/26/20 1241  . OLANZapine (ZYPREXA) tablet 5 mg  5 mg Oral Daily PRN Deloria Lair, NP   5 mg at 10/25/20 2207  . pantoprazole (PROTONIX) EC tablet 40 mg  40 mg Oral Daily Vanessa [place], MD   40 mg at 10/26/20 1241  . potassium chloride (KLOR-CON) CR tablet 10 mEq  10 mEq Oral Daily Vanessa Brenas, MD   10 mEq at 10/26/20 1234  . prednisoLONE acetate (PRED FORTE) 1 % ophthalmic suspension 1 drop  1 drop Left Eye Daily Vanessa [place], MD   1 drop at 10/26/20 1443  . pregabalin (LYRICA) capsule 100 mg  100 mg Oral TID Vanessa [place], MD   100 mg at 10/26/20 2152  . QUEtiapine (SEROQUEL) tablet 100 mg  100 mg Oral QHS Vanessa [place], MD   100 mg at 10/26/20 2152  . QUEtiapine (SEROQUEL) tablet 50 mg  50 mg Oral TID Vanessa [place], MD   50 mg at 10/26/20 1626   Current Outpatient Medications  Medication Sig Dispense Refill  . acetaminophen (TYLENOL) 325 MG tablet Take 650 mg by  mouth every 4 (four) hours as needed for fever or mild pain.    . carbidopa-levodopa (SINEMET IR) 25-100 MG tablet Take 2 tablets by mouth every 2 (two) hours. (between the hours of 8AM and 8PM)    . clonazePAM (KLONOPIN) 1 MG tablet Take 0.5 tablets (0.5 mg total) by mouth in the morning, at noon, and at bedtime. 30 tablet 0  . entacapone (COMTAN) 200 MG tablet Take 200 mg by mouth 6 (six) times daily.    Marland Kitchen escitalopram (LEXAPRO) 10 MG tablet Take 10 mg by mouth daily.    Marland Kitchen HYDROcodone-acetaminophen (NORCO/VICODIN) 5-325 MG tablet Take 1 tablet by mouth 2 (two) times daily as needed for moderate pain. (take at lunch and bedtime) 8 tablet 0  . omeprazole (PRILOSEC) 10 MG capsule Take 10 mg by mouth daily.    . potassium chloride (KLOR-CON) 10 MEQ tablet Take 10 mEq by mouth daily.    Marland Kitchen  prednisoLONE acetate (PRED FORTE) 1 % ophthalmic suspension Place 1 drop into the left eye daily.    . pregabalin (LYRICA) 100 MG capsule Take 100 mg by mouth 3 (three) times daily.    . QUEtiapine (SEROQUEL) 100 MG tablet Take 1 tablet (100 mg total) by mouth at bedtime. (Patient taking differently: Take 50 mg by mouth every morning.) 30 tablet 1  . QUEtiapine (SEROQUEL) 50 MG tablet Take 1 tablet (50 mg total) by mouth 3 (three) times daily. (Patient taking differently: Take 100 mg by mouth 3 (three) times daily. (1200,1600,2000)) 90 tablet 1  . senna-docusate (SENOKOT-S) 8.6-50 MG tablet Take 2 tablets by mouth 2 (two) times daily. Hold if loose stoold 60 tablet 0  . traZODone (DESYREL) 50 MG tablet Take 50 mg by mouth at bedtime.       Discharge Medications: Please see discharge summary for a list of discharge medications.  Relevant Imaging Results:  Relevant Lab Results:   Additional Information SS# [id number]  Berenice Bouton, LCSW

## 2020-10-27 NOTE — ED Notes (Signed)
Pt sat up in bed by this tech and Anda Kraft, Therapist, sports. Lunch tray given.

## 2020-10-27 NOTE — ED Notes (Signed)
Pt soiled in urine, changed by this tech and Janett Billow, Therapist, sports. Breakfast tray given, pt sat up for breakfast.

## 2020-10-27 NOTE — ED Notes (Signed)
Doristine Bosworth and husband at bedside

## 2020-10-27 NOTE — ED Notes (Signed)
Husband at bedside.  

## 2020-10-27 NOTE — ED Notes (Signed)
When getting up from commode to bed, pt hit head on siderail. Neurologically intact. States it hurt "a little". Dr. Jacqualine Code notified.

## 2020-10-27 NOTE — ED Notes (Signed)
Pt's RN called me and asked me to put pt on a bedpan as she refused to let him do it. I tried to put pt on bedpan but she insisted she did not need to have a bowel movement, just urinate and didn't want to use the bedpan. I told pt she could just urinate in her brief and I would change her afterwards. She was happy with that plan of action. Pt urinated, it was a very bright yellow, like your urine looks if you take B vitamins. I changed pt. Pt is resting at this time with no other needs. I also told RN everything I am including in this note.

## 2020-10-27 NOTE — ED Notes (Signed)
Patient cleansed, new depends in place, and repositioned for comfort.
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
## 2020-10-27 NOTE — ED Notes (Signed)
Pt given chips and water and chocolate ice cream for snack as requested

## 2020-10-27 NOTE — ED Notes (Signed)
Pt given meal tray.

## 2020-10-27 NOTE — ED Notes (Signed)
Pt pulled up in bed, pt finished snacks and chocolate ice cream as requested, lights dimmed on request

## 2020-10-27 NOTE — ED Notes (Signed)
VOL/CSW Placement 

## 2020-10-28 DIAGNOSIS — G20 Parkinson's disease: Secondary | ICD-10-CM | POA: Diagnosis not present

## 2020-10-28 NOTE — ED Notes (Signed)
E-signature not working at this time. Pt husband and nursing home RN verbalized understanding of D/C instructions, prescriptions and follow up care with no further questions at this time. Pt in NAD and wheeled to patient's husband car at time of D/C. This RN and Mel, NT assisted patient into car. Pt changed into personal clothing before departure.

## 2020-10-28 NOTE — ED Provider Notes (Signed)
-----------------------------------------   7:26 AM on 10/28/2020 -----------------------------------------   Blood pressure 124/80, pulse 92, temperature 98.2 F (36.8 C), resp. rate 16, height 1.702 m (5\' 7" ), weight 63.5 kg, SpO2 93 %.  The patient is calm and cooperative at this time.  There have been no acute events since the last update.  Awaiting disposition plan from Social Work team(s).   Hinda Kehr, MD 10/28/20 [phone]

## 2020-10-28 NOTE — TOC Transition Note (Addendum)
Transition of Care [name]) - CM/SW Discharge Note   Patient Details  Name: Sarah Donaldson MRN: [id number] Date of Birth: 01-07-1951  [facility]) CM/SW Contact:  Ova Freshwater Phone Number: [phone] 10/28/2020, 11:37 AM   Clinical Narrative:    Patient will d/c back to [facility], room #212, report# [phone].  Patient's spouse Sarah Donaldson, Sarah Donaldson (Spouse) [phone] will pick her up at 1:00PM. He requested the patient have a sandwich before she leaves, when she arrives at the facility they will have finished lunch.  EDP/ ED staff updated.   Final next level of care: Memory Care Barriers to Discharge: Other (comment)   Patient Goals and CMS Choice   CMS Medicare.gov Compare Post Acute Care list provided to:: Patient Represenative (must comment) Choice offered to / list presented to : Spouse  Discharge Placement                       Discharge Plan and Services In-house Referral: Clinical Social Work   Post Acute Care Choice: Nursing Home                               Social Determinants of Health (SDOH) Interventions     Readmission Risk Interventions No flowsheet data found.

## 2020-10-28 NOTE — ED Notes (Signed)
Patient cleansed and depends changed.

## 2020-10-28 NOTE — ED Notes (Signed)
Pt yelling out "nurse". When this RN when to assess patient, patient stating she needed to be changed. Pt brief changed by this RN. Pt able to roll from side to side with assistance from RN.

## 2020-11-04 ENCOUNTER — Emergency Department
Admission: EM | Admit: 2020-11-04 | Discharge: 2020-11-05 | Disposition: A | Discharge status: Home / Self Care | Payer: Medicare Other | Attending: Emergency Medicine | Admitting: Emergency Medicine

## 2020-11-04 ENCOUNTER — Encounter: Payer: Self-pay | Admitting: *Deleted

## 2020-11-04 ENCOUNTER — Other Ambulatory Visit: Payer: Self-pay

## 2020-11-04 ENCOUNTER — Emergency Department: Payer: Medicare Other

## 2020-11-04 DIAGNOSIS — F0280 Dementia in other diseases classified elsewhere without behavioral disturbance: Secondary | ICD-10-CM | POA: Diagnosis present

## 2020-11-04 DIAGNOSIS — I10 Essential (primary) hypertension: Secondary | ICD-10-CM | POA: Insufficient documentation

## 2020-11-04 DIAGNOSIS — F0281 Dementia in other diseases classified elsewhere with behavioral disturbance: Secondary | ICD-10-CM | POA: Diagnosis not present

## 2020-11-04 DIAGNOSIS — F03911 Unspecified dementia, unspecified severity, with agitation: Secondary | ICD-10-CM | POA: Diagnosis present

## 2020-11-04 DIAGNOSIS — Z79899 Other long term (current) drug therapy: Secondary | ICD-10-CM | POA: Diagnosis not present

## 2020-11-04 DIAGNOSIS — Z87891 Personal history of nicotine dependence: Secondary | ICD-10-CM | POA: Diagnosis not present

## 2020-11-04 DIAGNOSIS — G20A1 Parkinson's disease without dyskinesia, without mention of fluctuations: Secondary | ICD-10-CM | POA: Diagnosis present

## 2020-11-04 DIAGNOSIS — Z85828 Personal history of other malignant neoplasm of skin: Secondary | ICD-10-CM | POA: Insufficient documentation

## 2020-11-04 DIAGNOSIS — Z96652 Presence of left artificial knee joint: Secondary | ICD-10-CM | POA: Insufficient documentation

## 2020-11-04 DIAGNOSIS — Z20822 Contact with and (suspected) exposure to covid-19: Secondary | ICD-10-CM | POA: Diagnosis not present

## 2020-11-04 DIAGNOSIS — F0391 Unspecified dementia with behavioral disturbance: Secondary | ICD-10-CM | POA: Diagnosis not present

## 2020-11-04 DIAGNOSIS — R451 Restlessness and agitation: Secondary | ICD-10-CM | POA: Diagnosis not present

## 2020-11-04 DIAGNOSIS — G20 Parkinson's disease: Secondary | ICD-10-CM | POA: Insufficient documentation

## 2020-11-04 DIAGNOSIS — R456 Violent behavior: Secondary | ICD-10-CM | POA: Diagnosis present

## 2020-11-04 LAB — CBC WITH DIFFERENTIAL/PLATELET
Abs Immature Granulocytes: 0.02 10*3/uL (ref 0.00–0.07)
Basophils Absolute: 0 10*3/uL (ref 0.0–0.1)
Basophils Relative: 0 %
Eosinophils Absolute: 0.1 10*3/uL (ref 0.0–0.5)
Eosinophils Relative: 2 %
HCT: 36.7 % (ref 36.0–46.0)
Hemoglobin: 11.5 g/dL — ABNORMAL LOW (ref 12.0–15.0)
Immature Granulocytes: 0 %
Lymphocytes Relative: 23 %
Lymphs Abs: 1.5 10*3/uL (ref 0.7–4.0)
MCH: 27.7 pg (ref 26.0–34.0)
MCHC: 31.3 g/dL (ref 30.0–36.0)
MCV: 88.4 fL (ref 80.0–100.0)
Monocytes Absolute: 0.6 10*3/uL (ref 0.1–1.0)
Monocytes Relative: 10 %
Neutro Abs: 4.3 10*3/uL (ref 1.7–7.7)
Neutrophils Relative %: 65 %
Platelets: 177 10*3/uL (ref 150–400)
RBC: 4.15 MIL/uL (ref 3.87–5.11)
RDW: 14.6 % (ref 11.5–15.5)
WBC: 6.5 10*3/uL (ref 4.0–10.5)
nRBC: 0 % (ref 0.0–0.2)

## 2020-11-04 LAB — COMPREHENSIVE METABOLIC PANEL
ALT: <5 U/L (ref 0–44)
AST: 10 U/L — ABNORMAL LOW (ref 15–41)
Albumin: 3.7 g/dL (ref 3.5–5.0)
Alkaline Phosphatase: 94 U/L (ref 38–126)
Anion gap: 7 (ref 5–15)
BUN: 22 mg/dL (ref 8–23)
CO2: 25 mmol/L (ref 22–32)
Calcium: 8.7 mg/dL — ABNORMAL LOW (ref 8.9–10.3)
Chloride: 109 mmol/L (ref 98–111)
Creatinine, Ser: 0.5 mg/dL (ref 0.44–1.00)
GFR, Estimated: >60 mL/min (ref >60)
Glucose, Bld: 103 mg/dL — ABNORMAL HIGH (ref 70–99)
Potassium: 3.4 mmol/L — ABNORMAL LOW (ref 3.5–5.1)
Sodium: 141 mmol/L (ref 135–145)
Total Bilirubin: 0.9 mg/dL (ref 0.3–1.2)
Total Protein: 5.7 g/dL — ABNORMAL LOW (ref 6.5–8.1)

## 2020-11-04 LAB — ETHANOL: Alcohol, Ethyl (B): <10 mg/dL (ref <10)

## 2020-11-04 NOTE — ED Notes (Signed)
Pt brought in via ems with agitation at nursing home.  Pt has dementia and parkinson's   Pt drowsy on arrival with slurred speech.  md at bedside.  Labs sent
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
## 2020-11-05 ENCOUNTER — Emergency Department: Payer: Medicare Other

## 2020-11-05 DIAGNOSIS — F0391 Unspecified dementia with behavioral disturbance: Secondary | ICD-10-CM | POA: Diagnosis not present

## 2020-11-05 DIAGNOSIS — G20 Parkinson's disease: Secondary | ICD-10-CM | POA: Diagnosis not present

## 2020-11-05 LAB — URINE DRUG SCREEN, QUALITATIVE (ARMC ONLY)
Amphetamines, Ur Screen: NOT DETECTED
Barbiturates, Ur Screen: NOT DETECTED
Benzodiazepine, Ur Scrn: POSITIVE — AB
Cannabinoid 50 Ng, Ur ~~LOC~~: NOT DETECTED
Cocaine Metabolite,Ur ~~LOC~~: NOT DETECTED
MDMA (Ecstasy)Ur Screen: NOT DETECTED
Methadone Scn, Ur: NOT DETECTED
Opiate, Ur Screen: NOT DETECTED
Phencyclidine (PCP) Ur S: NOT DETECTED
Tricyclic, Ur Screen: POSITIVE — AB

## 2020-11-05 LAB — URINALYSIS, ROUTINE W REFLEX MICROSCOPIC
Bacteria, UA: NONE SEEN
Glucose, UA: NEGATIVE mg/dL
Hgb urine dipstick: NEGATIVE
Ketones, ur: 20 mg/dL — AB
Nitrite: NEGATIVE
Protein, ur: NEGATIVE mg/dL
Specific Gravity, Urine: 1.033 — ABNORMAL HIGH (ref 1.005–1.030)
pH: 5 (ref 5.0–8.0)

## 2020-11-05 LAB — RESP PANEL BY RT-PCR (FLU A&B, COVID) ARPGX2
Influenza A by PCR: NEGATIVE
Influenza B by PCR: NEGATIVE
SARS Coronavirus 2 by RT PCR: NEGATIVE

## 2020-11-05 NOTE — ED Notes (Signed)
Pt walked with assistance to bathroom. Pt brief changed.  Pt received and ate breakfast tray.

## 2020-11-05 NOTE — ED Notes (Signed)
Called the [facility], spoke to [place] and made him aware that pt was being discharged. Rachel Bo said to call husband for transport.

## 2020-11-05 NOTE — ED Notes (Signed)
Pt lying in bed talking, waiting on ct scan.
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
## 2020-11-05 NOTE — ED Notes (Signed)
Called pts husband, Sarah Donaldson, to let him know that pt was being discharged. [name] said he would be here in 30-45 minutes.

## 2020-11-05 NOTE — ED Notes (Signed)
IVC pending consult   
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
## 2020-11-05 NOTE — ED Notes (Signed)
Pt given breakfast tray
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
## 2020-11-05 NOTE — ED Notes (Signed)
Pt oob to br with 2 person assist.

## 2020-11-05 NOTE — BH Assessment (Signed)
Attempted to complete an assessment with Lynder Parents., NP; however pt was unable to participate. Psych team to follow up when pt arouses.

## 2020-11-05 NOTE — Progress Notes (Signed)
Sarah Donaldson is a 70 y.o. female with aggressive behavior, the patient is coming from [place] at [facility] with complaints of that behavior, she is combative towards staff. She presents to the ED for evaluation of combativeness. The psychiatry team attempted to assess the patient; she would not open her or answer to her name. The patient will need to be assessed when she can participate in the assessment process.

## 2020-11-08 ENCOUNTER — Non-Acute Institutional Stay: Payer: Medicare Other | Admitting: Adult Health Nurse Practitioner

## 2020-11-08 ENCOUNTER — Other Ambulatory Visit: Payer: Self-pay

## 2020-11-08 DIAGNOSIS — G20 Parkinson's disease: Secondary | ICD-10-CM

## 2020-11-08 DIAGNOSIS — Z515 Encounter for palliative care: Secondary | ICD-10-CM

## 2020-11-08 DIAGNOSIS — F0280 Dementia in other diseases classified elsewhere without behavioral disturbance: Secondary | ICD-10-CM

## 2020-11-08 DIAGNOSIS — F0391 Unspecified dementia with behavioral disturbance: Secondary | ICD-10-CM

## 2020-11-08 DIAGNOSIS — F03911 Unspecified dementia, unspecified severity, with agitation: Secondary | ICD-10-CM

## 2020-11-08 NOTE — Progress Notes (Signed)
[facility] Consult Note Telephone: [phone]  Fax: [phone]  PATIENT NAME: Sarah Donaldson DOB: [birth year] MRN: [id number]  PRIMARY CARE PROVIDER:   Cletis Athens, MD  REFERRING PROVIDER:  Cletis Athens, MD [address],  [place] [address]  RESPONSIBLE PARTY:   Alison Kubicki, husband [phone] or [phone]  Chief complaint: Follow-up palliative visit/combativeness    RECOMMENDATIONS and PLAN:  1.  Advanced care planning.  Patient is DNR.  DNR and MOST form in epic.  Spoke with husband via phone to update on today's visit  2.  Parkinson's/dementia.  Patient is being seen by neurology.  Functional status unchanged.  Continue follow-up and recommendations by neurology  3.  Agitation related to dementia.  Staff and husband both endorse that she is working with psychiatry and that they are working on weaning her off of the Seroquel and increasing the risperidone.  Husband feels as if she is calmer.  Continue follow-up and recommendations by psychiatry  4.  Support.  Husband states that he has been looking into other facilities especially with the locked memory care unit and to transition her to.  States that there are waiting lists for facilities he has looked into in [facility] and is now looking into [facility].  Has agreed for social work referral to help with this process.  Put in social work referral today.  Palliative will continue to monitor for symptom management/decline and make recommendations as needed.  Follow-up in 8 to 10 weeks.  Husband encouraged to call with any questions or concerns   HISTORY OF PRESENT ILLNESS:  Sarah Donaldson is a 70 y.o. year old female with multiple medical problems including Parkinson's, dementia secondary to Parkinson's disease with behavioral disturbances, Non-Hodgkins Lymphoma, subdural hematoma, gerd, hyperlipidemia, obesity, panic attacks, lumbar spondylosis, back  surgery, retinal detachment surgery, total knee arthroplasty. Palliative Care was asked to help address goals of care.  Patient was living at home and is now at the [facility].  Reviewed patient's EMR including most recent labs and imaging. Over the past 2 months patient has had 8 ER visits related to agitation and combativeness patient will hit and kick staff.  Patient is usually given Haldol and Versed in the ambulance on the way to the hospital.  Labs and imaging do not show any acute findings.  Patient did have hospital stay on 3/10 through 10/20/2020 for UTI.  Patient does not contribute much to HPI/ROS secondary to dementia.  Spoke with director at facility and has concerns that she belongs more in skilled nursing facility.  She is able to ambulate with a walker and does require standby assistance with ADLs.  She does have numerous falls.  Her appetite is good with no reports of weight loss.  Current weight is 145 pounds with BMI of 23.4.  She is working with facility psychiatry for adjustments of her meds for her behaviors.  CODE STATUS: DNR  PPS: 40% HOSPICE ELIGIBILITY/DIAGNOSIS: TBD  PHYSICAL EXAM:  HR 72 O2 98% on room air General: NAD, frail appearing Eyes: Sclera anicteric and noninjected with no discharge noted ENMT: Moist mucous membranes Cardiovascular: regular rate and rhythm Pulmonary: Lung sounds clear; normal respiratory effort Abdomen: soft, nontender, + bowel sounds Extremities: no edema, no joint deformities Skin: no rashes on exposed skin Neurological: Weakness; alert and oriented to person and place.   PAST MEDICAL HISTORY:  Past Medical History:  Diagnosis Date  . Cancer ([facility])  Non-hodgkin's lymphoma (in remission)  . Dementia ([facility])   . GERD (gastroesophageal reflux disease)    well-controlled w/ omeprazole  . Hyperlipidemia   . Knee joint cyst, left 02/07/2018  . Parkinson disease ([facility])   . Parkinson disease ([place])     SOCIAL HX:  Social History    Tobacco Use  . Smoking status: Former Research scientist (life sciences)  . Smokeless tobacco: Never Used  Substance Use Topics  . Alcohol use: Yes    Alcohol/week: 0.0 standard drinks    ALLERGIES:  Allergies  Allergen Reactions  . Percocet [Oxycodone-Acetaminophen]     Hallucination  . Baclofen     Caused numbness in mouth   . Codeine Sulfate Nausea And Vomiting  . Gabapentin Swelling    Mouth swelling   . Terazosin   . Tramadol Hcl     Interferes with Serotonin levels which affects her Parkinsons     PERTINENT MEDICATIONS:  Outpatient Encounter Medications as of 11/08/2020  Medication Sig  . acetaminophen (TYLENOL) 325 MG tablet Take 650 mg by mouth every 4 (four) hours as needed for fever or mild pain.  . carbidopa-levodopa (SINEMET IR) 25-100 MG tablet Take 2 tablets by mouth every 2 (two) hours. (between the hours of 8AM and 8PM)  . clonazePAM (KLONOPIN) 1 MG tablet Take 0.5 tablets (0.5 mg total) by mouth in the morning, at noon, and at bedtime.  . entacapone (COMTAN) 200 MG tablet Take 200 mg by mouth 6 (six) times daily.  Marland Kitchen escitalopram (LEXAPRO) 10 MG tablet Take 10 mg by mouth daily.  Marland Kitchen HYDROcodone-acetaminophen (NORCO/VICODIN) 5-325 MG tablet Take 1 tablet by mouth 2 (two) times daily as needed for moderate pain. (take at lunch and bedtime)  . omeprazole (PRILOSEC) 10 MG capsule Take 10 mg by mouth daily.  . potassium chloride (KLOR-CON) 10 MEQ tablet Take 10 mEq by mouth daily.  . prednisoLONE acetate (PRED FORTE) 1 % ophthalmic suspension Place 1 drop into the left eye daily.  . pregabalin (LYRICA) 100 MG capsule Take 100 mg by mouth 3 (three) times daily.  . QUEtiapine (SEROQUEL) 100 MG tablet Take 1 tablet (100 mg total) by mouth at bedtime. (Patient taking differently: Take 50 mg by mouth every morning.)  . QUEtiapine (SEROQUEL) 50 MG tablet Take 1 tablet (50 mg total) by mouth 3 (three) times daily. (Patient taking differently: Take 100 mg by mouth 3 (three) times daily. (1200,1600,2000))   . senna-docusate (SENOKOT-S) 8.6-50 MG tablet Take 2 tablets by mouth 2 (two) times daily. Hold if loose stoold  . traZODone (DESYREL) 50 MG tablet Take 50 mg by mouth at bedtime.   No facility-administered encounter medications on file as of 11/08/2020.     Layth Cerezo Jenetta Downer, NP

## 2020-11-11 ENCOUNTER — Telehealth: Payer: Self-pay

## 2020-11-11 NOTE — Telephone Encounter (Signed)
11/11/20 @11am : Palliative SW outreached patients spouse, Jenny Reichmann, per palliative care NP request.   Spouse shared that patient is currently a resident of the [facility]. Patient suffers from [place] Alzheimer's and exhibits sun downing behaviors. Spouse shared that the ALF has sent patient to the hospital a few times due to her sun downing behaviors and hospital has stated to spouse that he should possibly look into different placement options that can provide more care than patients current setting. Spouse shared that [facility] has given spouse warnings of a possible 30 day discharge should patients behaviors persist. Spouse shared that he [facility] and the staff is nice, but he is unsure if they can provide the level of care patient needs.   Spouse has outreached [facility] and was told that patient would have to be private pay for 18 months prior to transitioning to Medicaid. Spouse has also outreached 2 other ALF's in [place], that were both private pay. Spouse is interested in a list of other facilities to consider for patient.   SW encouraged spouse to meet with staff at [facility] to ensure there are not other interventions or medication adjustments they can consider prior to moving patient to another facility. SW Product/process development scientist of ALF facilities in [place] and SNF facilities in [place] and [place]. Spouse had no other concerns at this time and has SW contact if needed.

## 2020-11-21 ENCOUNTER — Other Ambulatory Visit: Payer: Self-pay | Admitting: *Deleted

## 2020-12-17 ENCOUNTER — Other Ambulatory Visit: Payer: Self-pay

## 2020-12-17 ENCOUNTER — Ambulatory Visit (INDEPENDENT_AMBULATORY_CARE_PROVIDER_SITE_OTHER): Payer: Medicare Other | Admitting: Podiatry

## 2020-12-17 DIAGNOSIS — L989 Disorder of the skin and subcutaneous tissue, unspecified: Secondary | ICD-10-CM

## 2020-12-17 DIAGNOSIS — B351 Tinea unguium: Secondary | ICD-10-CM | POA: Diagnosis not present

## 2020-12-17 DIAGNOSIS — M79675 Pain in left toe(s): Secondary | ICD-10-CM

## 2020-12-17 DIAGNOSIS — M79674 Pain in right toe(s): Secondary | ICD-10-CM

## 2020-12-17 NOTE — Progress Notes (Signed)
   SUBJECTIVE Patient presents to office today complaining of elongated, thickened nails that cause pain while ambulating in shoes.  She is unable to trim her own nails. Patient is here for further evaluation and treatment.  Patient presents today with her husband  Past Medical History:  Diagnosis Date  . Cancer ([facility])    Non-hodgkin's lymphoma (in remission)  . Dementia ([facility])   . GERD (gastroesophageal reflux disease)    well-controlled w/ omeprazole  . Hyperlipidemia   . Knee joint cyst, left 02/07/2018  . Parkinson disease ([facility])   . Parkinson disease ([place])     OBJECTIVE General Patient is awake, alert, and oriented x 3 and in no acute distress. Derm Skin is dry and supple bilateral. Negative open lesions or macerations. Remaining integument unremarkable. Nails are tender, long, thickened and dystrophic with subungual debris, consistent with onychomycosis, 1-5 bilateral. No signs of infection noted.  There is also some hyperkeratotic preulcerative callus tissue noted to the bilateral feet Vasc  DP and PT pedal pulses palpable bilaterally. Temperature gradient within normal limits.  Neuro Epicritic and protective threshold sensation grossly intact bilaterally.  Musculoskeletal Exam No symptomatic pedal deformities noted bilateral. Muscular strength within normal limits.  ASSESSMENT 1. Onychodystrophic nails 1-5 bilateral with hyperkeratosis of nails.  2. Onychomycosis of nail due to dermatophyte bilateral 3. Pain in foot bilateral 4.  Preulcerative callus tissue bilateral feet  PLAN OF CARE 1. Patient evaluated today.  2. Instructed to maintain good pedal hygiene and foot care.  3. Mechanical debridement of nails 1-5 bilaterally performed using a nail nipper. Filed with dremel without incident.  4.  Excisional debridement of the hyperkeratotic preulcerative callus tissue was performed using a tissue nipper without incident or bleeding  5.  Return to clinic in 3 mos.    Edrick Kins, DPM [facility]  Dr. Edrick Kins, [address]                                        [place], [place] [address]                Office [phone]  Fax [phone]

## 2021-03-25 ENCOUNTER — Ambulatory Visit: Payer: No Typology Code available for payment source | Admitting: Podiatry

## 2021-03-25 ENCOUNTER — Other Ambulatory Visit: Payer: Self-pay

## 2021-03-25 DIAGNOSIS — B351 Tinea unguium: Secondary | ICD-10-CM

## 2021-03-25 DIAGNOSIS — M79674 Pain in right toe(s): Secondary | ICD-10-CM

## 2021-03-25 DIAGNOSIS — L989 Disorder of the skin and subcutaneous tissue, unspecified: Secondary | ICD-10-CM

## 2021-03-25 DIAGNOSIS — M79675 Pain in left toe(s): Secondary | ICD-10-CM | POA: Diagnosis not present

## 2021-03-25 NOTE — Progress Notes (Signed)
   SUBJECTIVE Patient presents to office today wheelchair-bound nonambulatory complaining of elongated, thickened nails that cause pain while ambulating in shoes.  Patient is unable to trim their own nails. Patient is here for further evaluation and treatment.  Past Medical History:  Diagnosis Date   Cancer ([facility])    Non-hodgkin's lymphoma (in remission)   Dementia ([facility])    GERD (gastroesophageal reflux disease)    well-controlled w/ omeprazole   Hyperlipidemia    Knee joint cyst, left 02/07/2018   Parkinson disease ([facility])    Parkinson disease ([place])     OBJECTIVE General Patient is awake, alert, and oriented x 3 and in no acute distress. Derm Skin is dry and supple bilateral. Negative open lesions or macerations. Remaining integument unremarkable. Nails are tender, long, thickened and dystrophic with subungual debris, consistent with onychomycosis, 1-5 bilateral. No signs of infection noted. Vasc  DP and PT pedal pulses palpable bilaterally. Temperature gradient within normal limits.  Neuro Epicritic and protective threshold sensation grossly intact bilaterally.  Musculoskeletal Exam wheelchair-bound and nonambulatory secondary to Parkinson's disease  ASSESSMENT 1.  Pain due to onychomycosis of toenails both  PLAN OF CARE 1. Patient evaluated today.  2. Instructed to maintain good pedal hygiene and foot care.  3. Mechanical debridement of nails 1-5 bilaterally performed using a nail nipper. Filed with dremel without incident.  4. Return to clinic in 3 mos.    Edrick Kins, DPM [facility]  Dr. Edrick Kins, DPM    [address], [place] [address]                Office [phone]  Fax [phone]

## 2021-04-21 ENCOUNTER — Other Ambulatory Visit: Payer: Self-pay

## 2021-04-21 ENCOUNTER — Emergency Department (HOSPITAL_COMMUNITY)

## 2021-04-21 ENCOUNTER — Inpatient Hospital Stay (HOSPITAL_COMMUNITY)
Admission: EM | Admit: 2021-04-21 | Discharge: 2021-04-23 | DRG: 057 | Disposition: A | Discharge status: Nursing Home (Includes ICF) | Source: Skilled Nursing Facility | Attending: Internal Medicine | Admitting: Internal Medicine

## 2021-04-21 ENCOUNTER — Encounter (HOSPITAL_COMMUNITY): Payer: Self-pay | Admitting: Emergency Medicine

## 2021-04-21 DIAGNOSIS — R4182 Altered mental status, unspecified: Secondary | ICD-10-CM

## 2021-04-21 DIAGNOSIS — F0280 Dementia in other diseases classified elsewhere without behavioral disturbance: Secondary | ICD-10-CM | POA: Diagnosis present

## 2021-04-21 DIAGNOSIS — F03911 Unspecified dementia, unspecified severity, with agitation: Secondary | ICD-10-CM | POA: Diagnosis present

## 2021-04-21 DIAGNOSIS — Z79899 Other long term (current) drug therapy: Secondary | ICD-10-CM

## 2021-04-21 DIAGNOSIS — F0391 Unspecified dementia with behavioral disturbance: Secondary | ICD-10-CM | POA: Diagnosis present

## 2021-04-21 DIAGNOSIS — Z885 Allergy status to narcotic agent status: Secondary | ICD-10-CM

## 2021-04-21 DIAGNOSIS — Z20822 Contact with and (suspected) exposure to covid-19: Secondary | ICD-10-CM | POA: Diagnosis present

## 2021-04-21 DIAGNOSIS — Z888 Allergy status to other drugs, medicaments and biological substances status: Secondary | ICD-10-CM

## 2021-04-21 DIAGNOSIS — Z8249 Family history of ischemic heart disease and other diseases of the circulatory system: Secondary | ICD-10-CM

## 2021-04-21 DIAGNOSIS — K219 Gastro-esophageal reflux disease without esophagitis: Secondary | ICD-10-CM | POA: Diagnosis present

## 2021-04-21 DIAGNOSIS — C8590 Non-Hodgkin lymphoma, unspecified, unspecified site: Secondary | ICD-10-CM | POA: Diagnosis present

## 2021-04-21 DIAGNOSIS — G20 Parkinson's disease: Secondary | ICD-10-CM | POA: Diagnosis not present

## 2021-04-21 DIAGNOSIS — E869 Volume depletion, unspecified: Secondary | ICD-10-CM | POA: Diagnosis present

## 2021-04-21 DIAGNOSIS — I10 Essential (primary) hypertension: Secondary | ICD-10-CM | POA: Diagnosis present

## 2021-04-21 DIAGNOSIS — R451 Restlessness and agitation: Secondary | ICD-10-CM | POA: Diagnosis present

## 2021-04-21 DIAGNOSIS — E785 Hyperlipidemia, unspecified: Secondary | ICD-10-CM | POA: Diagnosis present

## 2021-04-21 DIAGNOSIS — F05 Delirium due to known physiological condition: Secondary | ICD-10-CM | POA: Diagnosis present

## 2021-04-21 DIAGNOSIS — Z66 Do not resuscitate: Secondary | ICD-10-CM | POA: Diagnosis present

## 2021-04-21 DIAGNOSIS — Z96652 Presence of left artificial knee joint: Secondary | ICD-10-CM | POA: Diagnosis present

## 2021-04-21 DIAGNOSIS — Z87891 Personal history of nicotine dependence: Secondary | ICD-10-CM

## 2021-04-21 DIAGNOSIS — L89152 Pressure ulcer of sacral region, stage 2: Secondary | ICD-10-CM | POA: Diagnosis present

## 2021-04-21 LAB — URINALYSIS, ROUTINE W REFLEX MICROSCOPIC
Bilirubin Urine: NEGATIVE
Glucose, UA: NEGATIVE mg/dL
Hgb urine dipstick: NEGATIVE
Ketones, ur: 15 mg/dL — AB
Leukocytes,Ua: NEGATIVE
Nitrite: NEGATIVE
Protein, ur: NEGATIVE mg/dL
Specific Gravity, Urine: >1.03 — ABNORMAL HIGH (ref 1.005–1.030)
pH: 5.5 (ref 5.0–8.0)

## 2021-04-21 LAB — CBC WITH DIFFERENTIAL/PLATELET
Abs Immature Granulocytes: 0.03 10*3/uL (ref 0.00–0.07)
Basophils Absolute: 0 10*3/uL (ref 0.0–0.1)
Basophils Relative: 0 %
Eosinophils Absolute: 0.1 10*3/uL (ref 0.0–0.5)
Eosinophils Relative: 2 %
HCT: 36.8 % (ref 36.0–46.0)
Hemoglobin: 11.5 g/dL — ABNORMAL LOW (ref 12.0–15.0)
Immature Granulocytes: 1 %
Lymphocytes Relative: 22 %
Lymphs Abs: 1.5 10*3/uL (ref 0.7–4.0)
MCH: 28.7 pg (ref 26.0–34.0)
MCHC: 31.3 g/dL (ref 30.0–36.0)
MCV: 91.8 fL (ref 80.0–100.0)
Monocytes Absolute: 0.6 10*3/uL (ref 0.1–1.0)
Monocytes Relative: 9 %
Neutro Abs: 4.3 10*3/uL (ref 1.7–7.7)
Neutrophils Relative %: 66 %
Platelets: 156 10*3/uL (ref 150–400)
RBC: 4.01 MIL/uL (ref 3.87–5.11)
RDW: 14.2 % (ref 11.5–15.5)
WBC: 6.5 10*3/uL (ref 4.0–10.5)
nRBC: 0 % (ref 0.0–0.2)

## 2021-04-21 LAB — COMPREHENSIVE METABOLIC PANEL
ALT: <5 U/L (ref 0–44)
AST: 11 U/L — ABNORMAL LOW (ref 15–41)
Albumin: 3.7 g/dL (ref 3.5–5.0)
Alkaline Phosphatase: 62 U/L (ref 38–126)
Anion gap: 7 (ref 5–15)
BUN: 28 mg/dL — ABNORMAL HIGH (ref 8–23)
CO2: 27 mmol/L (ref 22–32)
Calcium: 9 mg/dL (ref 8.9–10.3)
Chloride: 111 mmol/L (ref 98–111)
Creatinine, Ser: 0.6 mg/dL (ref 0.44–1.00)
GFR, Estimated: >60 mL/min (ref >60)
Glucose, Bld: 130 mg/dL — ABNORMAL HIGH (ref 70–99)
Potassium: 3.6 mmol/L (ref 3.5–5.1)
Sodium: 145 mmol/L (ref 135–145)
Total Bilirubin: 0.6 mg/dL (ref 0.3–1.2)
Total Protein: 5.7 g/dL — ABNORMAL LOW (ref 6.5–8.1)

## 2021-04-21 LAB — CBG MONITORING, ED: Glucose-Capillary: 151 mg/dL — ABNORMAL HIGH (ref 70–99)

## 2021-04-21 LAB — RAPID URINE DRUG SCREEN, HOSP PERFORMED
Amphetamines: NOT DETECTED
Barbiturates: NOT DETECTED
Benzodiazepines: POSITIVE — AB
Cocaine: NOT DETECTED
Opiates: POSITIVE — AB
Tetrahydrocannabinol: NOT DETECTED

## 2021-04-21 LAB — RESP PANEL BY RT-PCR (FLU A&B, COVID) ARPGX2
Influenza A by PCR: NEGATIVE
Influenza B by PCR: NEGATIVE
SARS Coronavirus 2 by RT PCR: NEGATIVE

## 2021-04-21 LAB — ETHANOL: Alcohol, Ethyl (B): <10 mg/dL (ref <10)

## 2021-04-21 MED ORDER — SODIUM CHLORIDE 0.9 % IV BOLUS
1000.0000 mL | Freq: Once | INTRAVENOUS | Status: AC
  Administered 2021-04-21: 1000 mL via INTRAVENOUS

## 2021-04-21 NOTE — ED Provider Notes (Signed)
[facility] DEPT Provider Note   CSN: [id number] Arrival date & time: 04/21/21  2117  LEVEL 5 CAVEAT - ALTERED MENTAL STATUS   History Chief Complaint  Patient presents with   Altered Mental Status    LANDEN GOETZMAN is a 70 y.o. female.  HPI 70 year old female brought in by EMS from [facility] after a fall.  The patient has been weaker and confused throughout the day today.  She was try to clean the dining room which is not normal.  The patient was found in the bathroom floor and stated she had just fallen and hit her head.  She then was assisted back to her chair (was able to walk with assistance) and then she fell out of the chair.  She was then agitated and combative.  She would get off the floor.  EMS found her to be agitated and combative and gave her 2.5 mg IM Haldol and 2.5 mg IM Versed.  Thus currently she is very sleepy and unable to give much history.  Normally she can walk with a walker.  The nurse from the facility also states that she just started on morphine within the last couple days.  However she has not been sick otherwise including no fevers.   Past Medical History:  Diagnosis Date   Cancer ([place])    Non-hodgkin's lymphoma (in remission)   Dementia ([place])    GERD (gastroesophageal reflux disease)    well-controlled w/ omeprazole   Hyperlipidemia    Knee joint cyst, left 02/07/2018   Parkinson disease ([place])    Parkinson disease ([place])     Patient Active Problem List   Diagnosis Date Noted   Agitation due to dementia ([place]) 10/25/2020   Pressure injury of skin [id number]   Acute metabolic encephalopathy [id number]   UTI (urinary tract infection) 10/17/2020   SunDown syndrome 10/17/2020   Essential hypertension 06/02/2020   Need for influenza vaccination 06/02/2020   Encounter for annual health check of caregiver 04/08/2020   Altered mental status    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[place])    SDH (subdural  hematoma) ([place]) 12/11/2019   Falls frequently 12/11/2019   S/P total knee replacement, left 06/07/2019   Osteoarthritis of knee 04/27/2019   Panic attacks 04/07/2019   Psychosis ([place]) 04/04/2019   Numbness 03/09/2018   Lumbar spondylosis 03/07/2018   Chronic pain of left knee 03/07/2018   Chronic upper back pain 12/08/2017   Benign neoplasm of hand 10/20/2017   Chronic pain syndrome 10/20/2017   Chronic pain of right lower extremity 10/20/2017   Chronic myofascial pain 10/20/2017   Numbness and tingling 07/12/2017   Sleep difficulties 07/12/2017   Low back pain 01/18/2017   Sleep behavior disorder, REM 01/18/2017   Lumbar radiculopathy 0000000   Follicular lymphoma of extranodal and solid organ sites ([place]) 05/01/2016   Lymphoma ([place]) 04/30/2016   Hallux limitus 07/27/2015   Low back pain with sciatica 02/13/2015   Bilateral low back pain with sciatica 02/13/2015   H/O adenomatous polyp of colon 04/06/2014   Hx of adenomatous colonic polyps 04/06/2014   Back ache 01/25/2014   Adiposity 01/25/2014   REM sleep behavior disorder 01/25/2014   Disordered sleep 01/25/2014   Has a tremor 01/25/2014   Obesity, unspecified 01/25/2014   Sleep disorder 01/25/2014   Backache 01/25/2014   Tremor 01/25/2014   Obesity 01/25/2014   Difficulty hearing 06/08/2012   Hearing loss 06/08/2012   Anxiety disorder 06/06/2012   Colon polyp  06/06/2012   Clinical depression 06/06/2012   Hypercholesteremia 06/06/2012   Idiopathic Parkinson's disease ([place]) 06/06/2012   Detached retina 06/06/2012   Depressive disorder 06/06/2012   Retinal detachment 06/06/2012   Parkinson's disease ([place]) 06/06/2012    Past Surgical History:  Procedure Laterality Date   BACK SURGERY     BUNIONECTOMY     NASAL SEPTUM SURGERY     RETINAL DETACHMENT SURGERY     TOTAL KNEE ARTHROPLASTY Left 06/07/2019   Procedure: TOTAL KNEE ARTHROPLASTY;  Surgeon: Lovell Sheehan, MD;  Location: [facility] ORS;  Service:  Orthopedics;  Laterality: Left;     OB History   No obstetric history on file.     Family History  Problem Relation Age of Onset   Heart disease Mother    Alcohol abuse Father    Breast cancer Neg Hx    Mental illness Neg Hx     Social History   Tobacco Use   Smoking status: Former   Smokeless tobacco: Never  Vaping Use   Vaping Use: Never used  Substance Use Topics   Alcohol use: Yes    Alcohol/week: 0.0 standard drinks   Drug use: No    Home Medications Prior to Admission medications   Medication Sig Start Date End Date Taking? Authorizing Provider  acetaminophen (TYLENOL) 325 MG tablet Take 650 mg by mouth every 4 (four) hours as needed for fever or mild pain.    [provider]  carbidopa-levodopa (SINEMET IR) 25-100 MG tablet Take 2 tablets by mouth every 2 (two) hours. (between the hours of 8AM and 8PM)    [provider]  clonazePAM (KLONOPIN) 1 MG tablet Take 0.5 tablets (0.5 mg total) by mouth in the morning, at noon, and at bedtime. 10/20/20   Sharen Hones, MD  entacapone (COMTAN) 200 MG tablet Take 200 mg by mouth 6 (six) times daily.    [provider]  escitalopram (LEXAPRO) 10 MG tablet Take 10 mg by mouth daily.    [provider]  HYDROcodone-acetaminophen (NORCO/VICODIN) 5-325 MG tablet Take 1 tablet by mouth 2 (two) times daily as needed for moderate pain. (take at lunch and bedtime) 10/20/20   Sharen Hones, MD  omeprazole (PRILOSEC) 10 MG capsule Take 10 mg by mouth daily.    [provider]  potassium chloride (KLOR-CON) 10 MEQ tablet Take 10 mEq by mouth daily.    [provider]  prednisoLONE acetate (PRED FORTE) 1 % ophthalmic suspension Place 1 drop into the left eye daily.    [provider]  pregabalin (LYRICA) 100 MG capsule Take 100 mg by mouth 3 (three) times daily. 11/18/19   [provider]  QUEtiapine (SEROQUEL) 100 MG tablet Take 1 tablet (100 mg total) by mouth at  bedtime. Patient taking differently: Take 50 mg by mouth every morning. 08/08/20   Clapacs, Madie Reno, MD  QUEtiapine (SEROQUEL) 50 MG tablet Take 1 tablet (50 mg total) by mouth 3 (three) times daily. Patient taking differently: Take 100 mg by mouth 3 (three) times daily. (1200,1600,2000) 08/08/20   Clapacs, Madie Reno, MD  senna-docusate (SENOKOT-S) 8.6-50 MG tablet Take 2 tablets by mouth 2 (two) times daily. Hold if loose stoold 10/20/20   Sharen Hones, MD  traZODone (DESYREL) 50 MG tablet Take 50 mg by mouth at bedtime.    [provider]    Allergies    Percocet [oxycodone-acetaminophen], Baclofen, Codeine sulfate, Gabapentin, Terazosin, and Tramadol hcl  Review of Systems   Review of  Systems  Unable to perform ROS: Mental status change   Physical Exam Updated Vital Signs BP 108/66   Pulse 66   Temp 97.6 F (36.4 C) (Oral)   Resp 13   SpO2 97%   Physical Exam Vitals and nursing note reviewed.  Constitutional:      Appearance: She is well-developed.  HENT:     Head: Normocephalic and atraumatic.     Right Ear: External ear normal.     Left Ear: External ear normal.     Nose: Nose normal.  Eyes:     General:        Right eye: No discharge.        Left eye: No discharge.     Pupils: Pupils are equal, round, and reactive to light.  Cardiovascular:     Rate and Rhythm: Normal rate and regular rhythm.     Heart sounds: Normal heart sounds.  Pulmonary:     Effort: Pulmonary effort is normal.     Breath sounds: Normal breath sounds.  Abdominal:     Palpations: Abdomen is soft.     Tenderness: There is no abdominal tenderness.  Musculoskeletal:     Comments: No hip or knee tenderness bilaterally.  Normal passive range of motion.  Skin:    General: Skin is warm and dry.  Neurological:     Mental Status: She is disoriented.     Comments: Patient is somnolent but will arouse to voice.  Equal/normal strength in both upper extremities but her lower extremities seem weak.   She can barely move them on the stretcher but it is symmetric.  Psychiatric:        Mood and Affect: Mood is not anxious.    ED Results / Procedures / Treatments   Labs (all labs ordered are listed, but only abnormal results are displayed) Labs Reviewed  COMPREHENSIVE METABOLIC PANEL - Abnormal; Notable for the following components:      Result Value   Glucose, Bld 130 (*)    BUN 28 (*)    Total Protein 5.7 (*)    AST 11 (*)    All other components within normal limits  CBC WITH DIFFERENTIAL/PLATELET - Abnormal; Notable for the following components:   Hemoglobin 11.5 (*)    All other components within normal limits  CBG MONITORING, ED - Abnormal; Notable for the following components:   Glucose-Capillary 151 (*)    All other components within normal limits  RESP PANEL BY RT-PCR (FLU A&B, COVID) [id number]  ETHANOL  URINALYSIS, ROUTINE W REFLEX MICROSCOPIC  RAPID URINE DRUG SCREEN, HOSP PERFORMED    EKG EKG Interpretation  Date/Time:  Monday April 21 2021 22:39:33 EDT Ventricular Rate:  63 PR Interval:  165 QRS Duration: 104 QT Interval:  430 QTC Calculation: 441 R Axis:   59 Text Interpretation: Sinus rhythm Borderline T abnormalities, inferior leads Confirmed by Sherwood Gambler [phone]) on 04/21/2021 11:06:23 PM  Radiology CT HEAD WO CONTRAST (5MM)  Result Date: 04/21/2021 CLINICAL DATA:  Altered.  Found on floor. EXAM: CT HEAD WITHOUT CONTRAST CT CERVICAL SPINE WITHOUT CONTRAST TECHNIQUE: Multidetector CT imaging of the head and cervical spine was performed following the standard protocol without intravenous contrast. Multiplanar CT image reconstructions of the cervical spine were also generated. COMPARISON:  None. FINDINGS: CT HEAD FINDINGS Brain: No evidence of large-territorial acute infarction. No parenchymal hemorrhage. No mass lesion. No extra-axial collection. No mass effect or midline shift. No hydrocephalus. Basilar cisterns are patent. Vascular: No hyperdense  vessel. Skull: No acute fracture or focal lesion. Sinuses/Orbits: Paranasal sinuses and mastoid air cells are clear. Left lens replacement and scleral buckle surgery. Otherwise orbits are unremarkable. Other: None. CT CERVICAL SPINE FINDINGS Alignment: Grade 1 anterolisthesis of C7 on T1. Skull base and vertebrae: Multilevel degenerative changes of the spine. Associated at least moderate multilevel osseous neural foraminal stenosis. No severe osseous central canal stenosis. No acute fracture. No aggressive appearing focal osseous lesion or focal pathologic process. Soft tissues and spinal canal: No prevertebral fluid or swelling. No visible canal hematoma. Upper chest: Right apical pulmonary micronodule. Other: None. IMPRESSION: 1. No acute intracranial abnormality. 2. No acute displaced fracture or traumatic listhesis of the cervical spine. Electronically Signed   By: Iven Finn M.D.   On: 04/21/2021 22:34   CT Cervical Spine Wo Contrast  Result Date: 04/21/2021 CLINICAL DATA:  Altered.  Found on floor. EXAM: CT HEAD WITHOUT CONTRAST CT CERVICAL SPINE WITHOUT CONTRAST TECHNIQUE: Multidetector CT imaging of the head and cervical spine was performed following the standard protocol without intravenous contrast. Multiplanar CT image reconstructions of the cervical spine were also generated. COMPARISON:  None. FINDINGS: CT HEAD FINDINGS Brain: No evidence of large-territorial acute infarction. No parenchymal hemorrhage. No mass lesion. No extra-axial collection. No mass effect or midline shift. No hydrocephalus. Basilar cisterns are patent. Vascular: No hyperdense vessel. Skull: No acute fracture or focal lesion. Sinuses/Orbits: Paranasal sinuses and mastoid air cells are clear. Left lens replacement and scleral buckle surgery. Otherwise orbits are unremarkable. Other: None. CT CERVICAL SPINE FINDINGS Alignment: Grade 1 anterolisthesis of C7 on T1. Skull base and vertebrae: Multilevel degenerative changes of  the spine. Associated at least moderate multilevel osseous neural foraminal stenosis. No severe osseous central canal stenosis. No acute fracture. No aggressive appearing focal osseous lesion or focal pathologic process. Soft tissues and spinal canal: No prevertebral fluid or swelling. No visible canal hematoma. Upper chest: Right apical pulmonary micronodule. Other: None. IMPRESSION: 1. No acute intracranial abnormality. 2. No acute displaced fracture or traumatic listhesis of the cervical spine. Electronically Signed   By: Iven Finn M.D.   On: 04/21/2021 22:34   DG Chest Portable 1 View  Result Date: 04/21/2021 CLINICAL DATA:  Altered mental status. EXAM: PORTABLE CHEST 1 VIEW COMPARISON:  Chest x-ray 10/24/2020. FINDINGS: The heart size and mediastinal contours are within normal limits. Both lungs are clear. The visualized skeletal structures are unremarkable. IMPRESSION: No active disease. Electronically Signed   By: Ronney Asters M.D.   On: 04/21/2021 22:38    Procedures Procedures   Medications Ordered in ED Medications  sodium chloride 0.9 % bolus 1,000 mL (1,000 mLs Intravenous New Bag/Given 04/21/21 2250)    ED Course  I have reviewed the triage vital signs and the nursing notes.  Pertinent labs & imaging results that were available during my care of the patient were reviewed by me and considered in my medical decision making (see chart for details).    MDM Rules/Calculators/A&P                           Initial work-up is so far fairly unremarkable.  Urinalysis is still pending.  Is unclear because of the sedation given by EMS but I suspect this was probably a recurrent agitation episode for which she has been seen at [facility] many times before.  However she will need reassessment when she has had her meds wear off. Care to Dr. Randal Buba. Final Clinical  Impression(s) / ED Diagnoses Final diagnoses:  None    Rx / DC Orders ED Discharge Orders     None         Sherwood Gambler, MD 04/21/21 2344

## 2021-04-21 NOTE — ED Triage Notes (Signed)
Patient is from [facility] staff is saying patient is altered. They found her on the floor. Then she was trying to hit and bite staff.  EMS gave 2.5 versed  2.5 haldol  Hx: parkinson  125/ 72- 76- 90% 2L-  cbg:135

## 2021-04-22 ENCOUNTER — Encounter (HOSPITAL_COMMUNITY): Payer: Self-pay | Admitting: Emergency Medicine

## 2021-04-22 DIAGNOSIS — F0391 Unspecified dementia with behavioral disturbance: Secondary | ICD-10-CM

## 2021-04-22 DIAGNOSIS — I10 Essential (primary) hypertension: Secondary | ICD-10-CM

## 2021-04-22 DIAGNOSIS — K219 Gastro-esophageal reflux disease without esophagitis: Secondary | ICD-10-CM | POA: Diagnosis present

## 2021-04-22 DIAGNOSIS — Z79899 Other long term (current) drug therapy: Secondary | ICD-10-CM | POA: Diagnosis not present

## 2021-04-22 DIAGNOSIS — E785 Hyperlipidemia, unspecified: Secondary | ICD-10-CM | POA: Diagnosis present

## 2021-04-22 DIAGNOSIS — R4182 Altered mental status, unspecified: Secondary | ICD-10-CM | POA: Diagnosis present

## 2021-04-22 DIAGNOSIS — R410 Disorientation, unspecified: Secondary | ICD-10-CM

## 2021-04-22 DIAGNOSIS — Z8249 Family history of ischemic heart disease and other diseases of the circulatory system: Secondary | ICD-10-CM | POA: Diagnosis not present

## 2021-04-22 DIAGNOSIS — F0280 Dementia in other diseases classified elsewhere without behavioral disturbance: Secondary | ICD-10-CM | POA: Diagnosis present

## 2021-04-22 DIAGNOSIS — E869 Volume depletion, unspecified: Secondary | ICD-10-CM | POA: Diagnosis present

## 2021-04-22 DIAGNOSIS — F05 Delirium due to known physiological condition: Secondary | ICD-10-CM | POA: Diagnosis present

## 2021-04-22 DIAGNOSIS — Z96652 Presence of left artificial knee joint: Secondary | ICD-10-CM | POA: Diagnosis present

## 2021-04-22 DIAGNOSIS — G20 Parkinson's disease: Secondary | ICD-10-CM | POA: Diagnosis present

## 2021-04-22 DIAGNOSIS — L89152 Pressure ulcer of sacral region, stage 2: Secondary | ICD-10-CM | POA: Diagnosis present

## 2021-04-22 DIAGNOSIS — Z888 Allergy status to other drugs, medicaments and biological substances status: Secondary | ICD-10-CM | POA: Diagnosis not present

## 2021-04-22 DIAGNOSIS — Z885 Allergy status to narcotic agent status: Secondary | ICD-10-CM | POA: Diagnosis not present

## 2021-04-22 DIAGNOSIS — Z20822 Contact with and (suspected) exposure to covid-19: Secondary | ICD-10-CM | POA: Diagnosis present

## 2021-04-22 DIAGNOSIS — C8590 Non-Hodgkin lymphoma, unspecified, unspecified site: Secondary | ICD-10-CM | POA: Diagnosis present

## 2021-04-22 DIAGNOSIS — R451 Restlessness and agitation: Secondary | ICD-10-CM | POA: Diagnosis present

## 2021-04-22 DIAGNOSIS — Z66 Do not resuscitate: Secondary | ICD-10-CM | POA: Diagnosis present

## 2021-04-22 DIAGNOSIS — Z87891 Personal history of nicotine dependence: Secondary | ICD-10-CM | POA: Diagnosis not present

## 2021-04-22 LAB — CBC WITH DIFFERENTIAL/PLATELET
Abs Immature Granulocytes: 0.01 10*3/uL (ref 0.00–0.07)
Basophils Absolute: 0 10*3/uL (ref 0.0–0.1)
Basophils Relative: 0 %
Eosinophils Absolute: 0.1 10*3/uL (ref 0.0–0.5)
Eosinophils Relative: 3 %
HCT: 35.3 % — ABNORMAL LOW (ref 36.0–46.0)
Hemoglobin: 10.9 g/dL — ABNORMAL LOW (ref 12.0–15.0)
Immature Granulocytes: 0 %
Lymphocytes Relative: 32 %
Lymphs Abs: 1.4 10*3/uL (ref 0.7–4.0)
MCH: 28.7 pg (ref 26.0–34.0)
MCHC: 30.9 g/dL (ref 30.0–36.0)
MCV: 92.9 fL (ref 80.0–100.0)
Monocytes Absolute: 0.5 10*3/uL (ref 0.1–1.0)
Monocytes Relative: 11 %
Neutro Abs: 2.4 10*3/uL (ref 1.7–7.7)
Neutrophils Relative %: 54 %
Platelets: 130 10*3/uL — ABNORMAL LOW (ref 150–400)
RBC: 3.8 MIL/uL — ABNORMAL LOW (ref 3.87–5.11)
RDW: 14.2 % (ref 11.5–15.5)
WBC: 4.4 10*3/uL (ref 4.0–10.5)
nRBC: 0 % (ref 0.0–0.2)

## 2021-04-22 LAB — COMPREHENSIVE METABOLIC PANEL
ALT: 5 U/L (ref 0–44)
AST: 8 U/L — ABNORMAL LOW (ref 15–41)
Albumin: 3.3 g/dL — ABNORMAL LOW (ref 3.5–5.0)
Alkaline Phosphatase: 65 U/L (ref 38–126)
Anion gap: 4 — ABNORMAL LOW (ref 5–15)
BUN: 23 mg/dL (ref 8–23)
CO2: 27 mmol/L (ref 22–32)
Calcium: 8.7 mg/dL — ABNORMAL LOW (ref 8.9–10.3)
Chloride: 114 mmol/L — ABNORMAL HIGH (ref 98–111)
Creatinine, Ser: 0.4 mg/dL — ABNORMAL LOW (ref 0.44–1.00)
GFR, Estimated: >60 mL/min (ref >60)
Glucose, Bld: 84 mg/dL (ref 70–99)
Potassium: 3.8 mmol/L (ref 3.5–5.1)
Sodium: 145 mmol/L (ref 135–145)
Total Bilirubin: 0.7 mg/dL (ref 0.3–1.2)
Total Protein: 5.4 g/dL — ABNORMAL LOW (ref 6.5–8.1)

## 2021-04-22 LAB — AMMONIA: Ammonia: 29 umol/L (ref 9–35)

## 2021-04-22 LAB — VITAMIN B12: Vitamin B-12: 284 pg/mL (ref 180–914)

## 2021-04-22 LAB — FOLATE: Folate: 8.3 ng/mL (ref >5.9)

## 2021-04-22 LAB — C-REACTIVE PROTEIN: CRP: 0.6 mg/dL (ref <1.0)

## 2021-04-22 LAB — TSH: TSH: 3.179 u[IU]/mL (ref 0.350–4.500)

## 2021-04-22 LAB — MAGNESIUM: Magnesium: 2 mg/dL (ref 1.7–2.4)

## 2021-04-22 MED ORDER — PREDNISOLONE ACETATE 1 % OP SUSP
1.0000 [drp] | Freq: Every day | OPHTHALMIC | Status: DC
  Administered 2021-04-23: 1 [drp] via OPHTHALMIC
  Filled 2021-04-22: qty 5

## 2021-04-22 MED ORDER — ONDANSETRON HCL 4 MG PO TABS: 4.0000 mg | ORAL_TABLET | Freq: Four times a day (QID) | ORAL | Status: DC | PRN

## 2021-04-22 MED ORDER — SENNOSIDES-DOCUSATE SODIUM 8.6-50 MG PO TABS: 1.0000 | ORAL_TABLET | Freq: Every evening | ORAL | Status: DC | PRN

## 2021-04-22 MED ORDER — PREGABALIN 50 MG PO CAPS
100.0000 mg | ORAL_CAPSULE | Freq: Three times a day (TID) | ORAL | Status: DC
  Administered 2021-04-22 – 2021-04-23 (×4): 100 mg via ORAL
  Filled 2021-04-22: qty 4
  Filled 2021-04-22: qty 2
  Filled 2021-04-22: qty 4
  Filled 2021-04-22: qty 2

## 2021-04-22 MED ORDER — MELATONIN 3 MG PO TABS
3.0000 mg | ORAL_TABLET | Freq: Every evening | ORAL | Status: DC | PRN
  Filled 2021-04-22: qty 1

## 2021-04-22 MED ORDER — CARBIDOPA-LEVODOPA 25-100 MG PO TABS: 1.0000 | ORAL_TABLET | ORAL | Status: DC

## 2021-04-22 MED ORDER — ENTACAPONE 200 MG PO TABS
200.0000 mg | ORAL_TABLET | ORAL | Status: DC
  Administered 2021-04-22 – 2021-04-23 (×8): 200 mg via ORAL
  Filled 2021-04-22 (×13): qty 1

## 2021-04-22 MED ORDER — CARBIDOPA-LEVODOPA 25-100 MG PO TABS
2.0000 | ORAL_TABLET | ORAL | Status: DC
  Administered 2021-04-22 – 2021-04-23 (×6): 2 via ORAL
  Filled 2021-04-22 (×7): qty 2

## 2021-04-22 MED ORDER — CARBIDOPA-LEVODOPA 25-100 MG PO TABS
3.0000 | ORAL_TABLET | ORAL | Status: DC
  Administered 2021-04-22 (×3): 3 via ORAL
  Filled 2021-04-22 (×7): qty 3

## 2021-04-22 MED ORDER — HYDROCODONE-ACETAMINOPHEN 5-325 MG PO TABS
1.0000 | ORAL_TABLET | Freq: Two times a day (BID) | ORAL | Status: DC | PRN
  Administered 2021-04-22: 1 via ORAL
  Filled 2021-04-22: qty 1

## 2021-04-22 MED ORDER — ONDANSETRON HCL 4 MG/2ML IJ SOLN: 4.0000 mg | Freq: Four times a day (QID) | INTRAMUSCULAR | Status: DC | PRN

## 2021-04-22 MED ORDER — ACETAMINOPHEN 650 MG RE SUPP: 650.0000 mg | Freq: Four times a day (QID) | RECTAL | Status: DC | PRN

## 2021-04-22 MED ORDER — LACTATED RINGERS IV SOLN: INTRAVENOUS | Status: AC

## 2021-04-22 MED ORDER — HALOPERIDOL LACTATE 5 MG/ML IJ SOLN: 2.0000 mg | Freq: Four times a day (QID) | INTRAMUSCULAR | Status: AC | PRN

## 2021-04-22 MED ORDER — CLONAZEPAM 0.5 MG PO TABS
0.5000 mg | ORAL_TABLET | Freq: Four times a day (QID) | ORAL | Status: DC
  Administered 2021-04-22 – 2021-04-23 (×5): 0.5 mg via ORAL
  Filled 2021-04-22 (×5): qty 1

## 2021-04-22 MED ORDER — TRAZODONE HCL 50 MG PO TABS: 50.0000 mg | ORAL_TABLET | Freq: Every evening | ORAL | Status: DC | PRN

## 2021-04-22 MED ORDER — ENOXAPARIN SODIUM 40 MG/0.4ML IJ SOSY
40.0000 mg | PREFILLED_SYRINGE | INTRAMUSCULAR | Status: DC
  Administered 2021-04-23: 40 mg via SUBCUTANEOUS
  Filled 2021-04-22: qty 0.4

## 2021-04-22 MED ORDER — ACETAMINOPHEN 325 MG PO TABS: 650.0000 mg | ORAL_TABLET | Freq: Four times a day (QID) | ORAL | Status: DC | PRN

## 2021-04-22 MED ORDER — ARIPIPRAZOLE 5 MG PO TABS
5.0000 mg | ORAL_TABLET | Freq: Every day | ORAL | Status: DC
  Administered 2021-04-22 – 2021-04-23 (×2): 5 mg via ORAL
  Filled 2021-04-22 (×2): qty 1

## 2021-04-22 MED ORDER — RISPERIDONE 0.25 MG PO TABS
0.5000 mg | ORAL_TABLET | Freq: Two times a day (BID) | ORAL | Status: DC
  Administered 2021-04-22 – 2021-04-23 (×3): 0.5 mg via ORAL
  Filled 2021-04-22: qty 1
  Filled 2021-04-22: qty 2
  Filled 2021-04-22: qty 1
  Filled 2021-04-22: qty 2

## 2021-04-22 MED ORDER — ESCITALOPRAM OXALATE 20 MG PO TABS
20.0000 mg | ORAL_TABLET | Freq: Every day | ORAL | Status: DC
  Administered 2021-04-22 – 2021-04-23 (×2): 20 mg via ORAL
  Filled 2021-04-22: qty 2
  Filled 2021-04-22: qty 1

## 2021-04-22 MED ORDER — SODIUM FLUORIDE 1.1 % DT GEL: Freq: Four times a day (QID) | DENTAL | Status: DC

## 2021-04-22 MED ORDER — PANTOPRAZOLE SODIUM 40 MG PO TBEC
40.0000 mg | DELAYED_RELEASE_TABLET | Freq: Every day | ORAL | Status: DC
  Administered 2021-04-22 – 2021-04-23 (×2): 40 mg via ORAL
  Filled 2021-04-22 (×2): qty 1

## 2021-04-22 MED ORDER — POLYETHYLENE GLYCOL 3350 17 G PO PACK: 17.0000 g | PACK | Freq: Every day | ORAL | Status: DC | PRN

## 2021-04-22 NOTE — H&P (Addendum)
History and Physical    Sarah Donaldson [id number] DOB: Feb 17, 1951 DOA: 04/21/2021  PCP: Cletis Athens, MD  Patient coming from: [place]   Chief Complaint:  Chief Complaint  Patient presents with   Altered Mental Status     HPI:    70 year old female with past medical history of Parkinson disease with dementia, hypertension, low-grade non-Hodgkin lymphoma, gastroesophageal reflux disease who presents to [facility] emergency department via EMS after experiencing an a bout of extreme agitation and combativeness with staff.  Of note, patient is an extremely poor historian due to known history of dementia as well as experiencing a bout of altered mental status during the events that precipitated her presentation.  Majority the history is been obtained from review of triage notes, EMS notes, ED notes and discussion with the husband via phone conversation.  Patient is noted to have a been acting more confused yesterday afternoon evening.  This resulted in eventual fall without any observed head injury or seizure activity.  Patient was agitated and combative with both facility staff and EMS was contacted.  Per my discussion with husband via phone conversation, patient has not been exhibiting any behaviors or activity out of the ordinary as of late.  Patient has not had any change in diet or any complaints, pain or otherwise.  Upon EMS arrival patient continued to be extremely agitated and combative requiring EMS to administer 2.5 mg of IM Versed followed by 2.5 mg of IM Haldol.  Patient was then transported to [facility] Emergency Department for evaluation.  Upon evaluation in the emergency department patient continues to exhibit lethargy and confusion.  Initial encephalopathy work-up has been unremarkable including unremarkable CT imaging of the head, negative COVID-19 testing, unremarkable urinalysis.  The hospitalist group is now been  called to assess the patient for admission to the hospital.  Review of Systems:   Review of Systems  Unable to perform ROS: Mental status change  Neurological:  Positive for weakness.   Past Medical History:  Diagnosis Date   Cancer ([place])    Non-hodgkin's lymphoma (in remission)   Dementia ([place])    GERD (gastroesophageal reflux disease)    well-controlled w/ omeprazole   Hyperlipidemia    Knee joint cyst, left 02/07/2018   Parkinson disease ([place])    Parkinson disease ([place])     Past Surgical History:  Procedure Laterality Date   BACK SURGERY     BUNIONECTOMY     NASAL SEPTUM SURGERY     RETINAL DETACHMENT SURGERY     TOTAL KNEE ARTHROPLASTY Left 06/07/2019   Procedure: TOTAL KNEE ARTHROPLASTY;  Surgeon: Lovell Sheehan, MD;  Location: [facility] ORS;  Service: Orthopedics;  Laterality: Left;     reports that she has quit smoking. She has never used smokeless tobacco. She reports current alcohol use. She reports that she does not use drugs.  Allergies  Allergen Reactions   Percocet [Oxycodone-Acetaminophen]     Hallucination   Baclofen     Caused numbness in mouth    Codeine Sulfate Nausea And Vomiting   Gabapentin Swelling    Mouth swelling    Terazosin    Tramadol Hcl     Interferes with Serotonin levels which affects her Parkinsons    Family History  Problem Relation Age of Onset   Heart disease Mother    Alcohol abuse Father    Breast cancer Neg Hx    Mental illness Neg Hx  Prior to Admission medications   Medication Sig Start Date End Date Taking? Authorizing Provider  ARIPiprazole (ABILIFY) 5 MG tablet Take 5 mg by mouth daily. 04/15/21  Yes [provider]  carbidopa-levodopa (SINEMET IR) 25-100 MG tablet Take by mouth. Take 3 tablets by mouth, at 2 pm, 4 pm, and 6 pm.  And 2 tablets by mouth at  8pm.   Yes [provider]  carbidopa-levodopa (SINEMET IR) 25-100 MG tablet Take 2 tablets by mouth See admin instructions. Take 2 tablets by  mouth between the hours of 8am and 12pm. (8am, 10am, 12pm)   Yes [provider]  clonazePAM (KLONOPIN) 1 MG tablet Take 0.5 tablets (0.5 mg total) by mouth in the morning, at noon, and at bedtime. Patient taking differently: Take 0.5 mg by mouth in the morning, at noon, in the evening, and at bedtime. 8 am, 12 pm, 4 pm, and 8 pm 10/20/20  Yes Zhang, Danford Bad, MD  DENTAGEL 1.1 % GEL dental gel Take by mouth. Apply 1 drop per teeth into fluoride tray an wear nightly 04/09/21  Yes [provider]  entacapone (COMTAN) 200 MG tablet Take 200 mg by mouth See admin instructions. Take 1 tablet by mouth 7 times daily (8 am, 10 am, 12 pm, 2 pm, 4 pm, 6 pm, and 8 pm).   Yes [provider]  escitalopram (LEXAPRO) 20 MG tablet Take 20 mg by mouth daily.   Yes [provider]  HYDROcodone-acetaminophen (NORCO/VICODIN) 5-325 MG tablet Take 1 tablet by mouth 2 (two) times daily as needed for moderate pain. (take at lunch and bedtime) 10/20/20  Yes Sharen Hones, MD  melatonin 3 MG TABS tablet Take 3 mg by mouth at bedtime.   Yes [provider]  morphine (MS CONTIN) 15 MG 12 hr tablet Take 15 mg by mouth every 12 (twelve) hours.   Yes [provider]  omeprazole (PRILOSEC) 10 MG capsule Take 10 mg by mouth daily.   Yes [provider]  potassium chloride (KLOR-CON) 10 MEQ tablet Take 10 mEq by mouth daily.   Yes [provider]  prednisoLONE acetate (PRED FORTE) 1 % ophthalmic suspension Place 1 drop into the left eye daily.   Yes [provider]  pregabalin (LYRICA) 100 MG capsule Take 100 mg by mouth 3 (three) times daily. 11/18/19  Yes [provider]  risperiDONE (RISPERDAL) 0.5 MG tablet Take 0.5 mg by mouth 2 (two) times daily. 03/28/21  Yes [provider]  QUEtiapine (SEROQUEL) 100 MG tablet Take 1 tablet (100 mg total) by mouth at bedtime. Patient not taking: Reported on 04/22/2021 08/08/20   Clapacs, Madie Reno, MD   QUEtiapine (SEROQUEL) 50 MG tablet Take 1 tablet (50 mg total) by mouth 3 (three) times daily. Patient not taking: Reported on 04/22/2021 08/08/20   Clapacs, Madie Reno, MD  senna-docusate (SENOKOT-S) 8.6-50 MG tablet Take 2 tablets by mouth 2 (two) times daily. Hold if loose stoold Patient not taking: Reported on 04/22/2021 10/20/20   Sharen Hones, MD  traZODone (DESYREL) 50 MG tablet Take 50 mg by mouth at bedtime. Patient not taking: Reported on 04/22/2021    [provider]    Physical Exam: Vitals:   04/21/21 2231 04/22/21 0005 04/22/21 0030 04/22/21 0100  BP:  118/68 114/70 100/67  Pulse:  65 63 61  Resp:  (!) '9 12 11  '$ Temp: 97.6 F (36.4 C)     TempSrc: Oral     SpO2:  98% 98%  96%    Constitutional: Patient is lethargic arousable and only oriented x1, no associated distress.   Skin: no rashes, no lesions, poor skin turgor noted. Eyes: Pupils are equally reactive to light.  No evidence of scleral icterus or conjunctival pallor.  ENMT: Dry mucous membranes noted.  Posterior pharynx clear of any exudate or lesions.   Neck: normal, supple, no masses, no thyromegaly.  No evidence of jugular venous distension.   Respiratory: clear to auscultation bilaterally, no wheezing, no crackles. Normal respiratory effort. No accessory muscle use.  Cardiovascular: Regular rate and rhythm, no murmurs / rubs / gallops. No extremity edema. 2+ pedal pulses. No carotid bruits.  Chest:   Nontender without crepitus or deformity.   Back:   Nontender without crepitus or deformity. Abdomen: Abdomen is soft and nontender.  No evidence of intra-abdominal masses.  Positive bowel sounds noted in all quadrants.   Musculoskeletal: No joint deformity upper and lower extremities. Good ROM, no contractures. Normal muscle tone.  Neurologic: Patient is extremely lethargic but arousable and disoriented.  Sensation intact.  Patient moving all 4 extremities spontaneously.  Patient is only intermittently following  commands.  Patient is responsive to verbal stimuli.   Psychiatric: Unable to assess due to significant lethargy and confusion.  Patient currently does not seem to possess insight as to her current situation.  Labs on Admission: I have personally reviewed following labs and imaging studies -   CBC: Recent Labs  Lab 04/21/21 2207  WBC 6.5  NEUTROABS 4.3  HGB 11.5*  HCT 36.8  MCV 91.8  PLT [id number]   Basic Metabolic Panel: Recent Labs  Lab 04/21/21 2207  NA 145  K 3.6  CL 111  CO2 27  GLUCOSE 130*  BUN 28*  CREATININE 0.60  CALCIUM 9.0   GFR: CrCl cannot be calculated (Unknown ideal weight.). Liver Function Tests: Recent Labs  Lab 04/21/21 2207  AST 11*  ALT <5  ALKPHOS 62  BILITOT 0.6  PROT 5.7*  ALBUMIN 3.7   No results for input(s): LIPASE, AMYLASE in the last 168 hours. No results for input(s): AMMONIA in the last 168 hours. Coagulation Profile: No results for input(s): INR, PROTIME in the last 168 hours. Cardiac Enzymes: No results for input(s): CKTOTAL, CKMB, CKMBINDEX, TROPONINI in the last 168 hours. BNP (last 3 results) No results for input(s): PROBNP in the last 8760 hours. HbA1C: No results for input(s): HGBA1C in the last 72 hours. CBG: Recent Labs  Lab 04/21/21 2237  GLUCAP 151*   Lipid Profile: No results for input(s): CHOL, HDL, LDLCALC, TRIG, CHOLHDL, LDLDIRECT in the last 72 hours. Thyroid Function Tests: No results for input(s): TSH, T4TOTAL, FREET4, T3FREE, THYROIDAB in the last 72 hours. Anemia Panel: No results for input(s): VITAMINB12, FOLATE, FERRITIN, TIBC, IRON, RETICCTPCT in the last 72 hours. Urine analysis:    Component Value Date/Time   COLORURINE ORANGE (A) 04/21/2021 2300   APPEARANCEUR CLEAR 04/21/2021 2300   LABSPEC >1.030 (H) 04/21/2021 2300   PHURINE 5.5 04/21/2021 2300   GLUCOSEU NEGATIVE 04/21/2021 2300   HGBUR NEGATIVE 04/21/2021 2300   BILIRUBINUR NEGATIVE 04/21/2021 2300   BILIRUBINUR '1mg'$  01/05/2020 0933    KETONESUR 15 (A) 04/21/2021 2300   PROTEINUR NEGATIVE 04/21/2021 2300   UROBILINOGEN 4.0 (A) 01/05/2020 0933   NITRITE NEGATIVE 04/21/2021 2300   LEUKOCYTESUR NEGATIVE 04/21/2021 2300    Radiological Exams on Admission - Personally Reviewed: CT HEAD WO CONTRAST (5MM)  Result Date: 04/21/2021 CLINICAL DATA:  Altered.  Found on floor.  EXAM: CT HEAD WITHOUT CONTRAST CT CERVICAL SPINE WITHOUT CONTRAST TECHNIQUE: Multidetector CT imaging of the head and cervical spine was performed following the standard protocol without intravenous contrast. Multiplanar CT image reconstructions of the cervical spine were also generated. COMPARISON:  None. FINDINGS: CT HEAD FINDINGS Brain: No evidence of large-territorial acute infarction. No parenchymal hemorrhage. No mass lesion. No extra-axial collection. No mass effect or midline shift. No hydrocephalus. Basilar cisterns are patent. Vascular: No hyperdense vessel. Skull: No acute fracture or focal lesion. Sinuses/Orbits: Paranasal sinuses and mastoid air cells are clear. Left lens replacement and scleral buckle surgery. Otherwise orbits are unremarkable. Other: None. CT CERVICAL SPINE FINDINGS Alignment: Grade 1 anterolisthesis of C7 on T1. Skull base and vertebrae: Multilevel degenerative changes of the spine. Associated at least moderate multilevel osseous neural foraminal stenosis. No severe osseous central canal stenosis. No acute fracture. No aggressive appearing focal osseous lesion or focal pathologic process. Soft tissues and spinal canal: No prevertebral fluid or swelling. No visible canal hematoma. Upper chest: Right apical pulmonary micronodule. Other: None. IMPRESSION: 1. No acute intracranial abnormality. 2. No acute displaced fracture or traumatic listhesis of the cervical spine. Electronically Signed   By: Iven Finn M.D.   On: 04/21/2021 22:34   CT Cervical Spine Wo Contrast  Result Date: 04/21/2021 CLINICAL DATA:  Altered.  Found on floor. EXAM: CT  HEAD WITHOUT CONTRAST CT CERVICAL SPINE WITHOUT CONTRAST TECHNIQUE: Multidetector CT imaging of the head and cervical spine was performed following the standard protocol without intravenous contrast. Multiplanar CT image reconstructions of the cervical spine were also generated. COMPARISON:  None. FINDINGS: CT HEAD FINDINGS Brain: No evidence of large-territorial acute infarction. No parenchymal hemorrhage. No mass lesion. No extra-axial collection. No mass effect or midline shift. No hydrocephalus. Basilar cisterns are patent. Vascular: No hyperdense vessel. Skull: No acute fracture or focal lesion. Sinuses/Orbits: Paranasal sinuses and mastoid air cells are clear. Left lens replacement and scleral buckle surgery. Otherwise orbits are unremarkable. Other: None. CT CERVICAL SPINE FINDINGS Alignment: Grade 1 anterolisthesis of C7 on T1. Skull base and vertebrae: Multilevel degenerative changes of the spine. Associated at least moderate multilevel osseous neural foraminal stenosis. No severe osseous central canal stenosis. No acute fracture. No aggressive appearing focal osseous lesion or focal pathologic process. Soft tissues and spinal canal: No prevertebral fluid or swelling. No visible canal hematoma. Upper chest: Right apical pulmonary micronodule. Other: None. IMPRESSION: 1. No acute intracranial abnormality. 2. No acute displaced fracture or traumatic listhesis of the cervical spine. Electronically Signed   By: Iven Finn M.D.   On: 04/21/2021 22:34   DG Chest Portable 1 View  Result Date: 04/21/2021 CLINICAL DATA:  Altered mental status. EXAM: PORTABLE CHEST 1 VIEW COMPARISON:  Chest x-ray 10/24/2020. FINDINGS: The heart size and mediastinal contours are within normal limits. Both lungs are clear. The visualized skeletal structures are unremarkable. IMPRESSION: No active disease. Electronically Signed   By: Ronney Asters M.D.   On: 04/21/2021 22:38    EKG: Personally reviewed.  Rhythm is normal  sinus rhythm with heart rate of 63 bpm.  No dynamic ST segment changes appreciated.  Assessment/Plan Principal Problem: Delirium secondary to dementia  Patient exhibited a several hour period of increased confusion with a witnessed fall as well as bouts of agitation and combativeness with both facility staff and EMS staff. Patient had to be administered doses of Haldol and Versed for the protection of staff Patient is undergoing a thorough evaluation to evaluate for any evidence of  encephalopathy including CT imaging of the head, urine toxicology screen, urinalysis, ammonia level, hepatic function panel, If work-up is negative, presentation is likely multifactorial secondary to polypharmacy patient is on numerous agents in an effort to maintain her mood in addition to exacerbation by presence of volume depletion.   Otherwise, based on my discussion with husband it seems the patient has had a gradual progression of her known dementia.  It is likely in a an episode of transient delirium related to advanced dementia.    Active Problems:  Parkinson disease  Continue home regimen of Sinemet    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[place])  Please see assessment and plan above, patient has longstanding dementia related to her Parkinson disease, current presentation may be simply related to progressive dementia. Patient's dementia seemingly severe with husband reporting ongoing constant efforts to get her to take p.o. intake to maintain her weight and hydration as well as ongoing efforts to avoid bouts of agitation. Minimizing uncomfortable stimuli as much as possible Encouraging family to remain at bedside is much as Frequent nursing rounds with frequent redirection of patient    GERD without esophagitis  Continue home regimen of daily PPI   Code Status:  DNR  code status decision has been confirmed with: husband who is decision maker Family Communication: Spoke with husband via phone  conversation who has been updated on plan of care.  Status is: Observation  The patient remains OBS appropriate and will d/c before 2 midnights.  Dispo: The patient is from: ALF              Anticipated d/c is to: ALF              Patient currently is not medically stable to d/c.   Difficult to place patient Yes        Vernelle Emerald MD [facility] Pager [phone]  If 7PM-7AM, please contact night-coverage [email or web address] Use universal [place] password for that web site. If you do not have the password, please call the hospital operator.  04/22/2021, 2:36 AM

## 2021-04-22 NOTE — Progress Notes (Signed)
Pt arrived at unit at 1530pm. Pt is alert and oriented to situation and place. Pt vitals were stable . Pt called husband updated on room. Pt husband at bed side. Will continue monitor the pt.

## 2021-04-22 NOTE — Progress Notes (Signed)
PROGRESS NOTE    Sarah Donaldson  [id number] DOB: 03-28-1951 DOA: 04/21/2021 PCP: Cletis Athens, MD   Brief Narrative:  70 year old with history of Parkinson's disease, dementia, HTN, low-grade non-Hodgkin's lymphoma, GERD was brought to the hospital from her assisted living facility [facility] for evaluation of combative behavior and agitation.  Upon admission her routine blood work and vital signs were unremarkable.  No evidence of infection was noted, likely behavioral disruption was secondary to delirium.   Assessment & Plan:   Principal Problem:   Altered mental status Active Problems:   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[place])   Essential hypertension   GERD without esophagitis  Delirium with underlying dementia - Off-and-on patient become slightly combative therefore requiring as needed Haldol.  CT head, UA, ammonia and rest of the work-up has been negative thus far.  We will closely monitor her in the hospital  History of Parkinson's disease - Continue home medication including Sinemet  GERD - PPI   DVT prophylaxis: enoxaparin (LOVENOX) injection 40 mg Start: 04/22/21 1000  Code Status: DNR Family Communication: Spoke with her husband  Patient still needs to be in the hospital due to off-and-on combative behavior requiring as needed medications.  If she remains stable over next 24 hours, she may be able to go back to her assisted living tomorrow  Dispo: The patient is from: ALF              Anticipated d/c is to: ALF              Patient currently is not medically stable to d/c.   Difficult to place patient No       Nutritional status           There is no height or weight on file to calculate BMI.  Pressure Injury 10/18/20 Sacrum Left;Medial Stage 2 -  Partial thickness loss of dermis presenting as a shallow open injury with a red, pink wound bed without slough. two small stage 2 pressure injuries on sacrum with surrounding erythema  (Active)  10/18/20 1709  Location: Sacrum  Location Orientation: Left;Medial  Staging: Stage 2 -  Partial thickness loss of dermis presenting as a shallow open injury with a red, pink wound bed without slough.  Wound Description (Comments): two small stage 2 pressure injuries on sacrum with surrounding erythema  Present on Admission: Yes          Subjective: Patient seen and examined at bedside.  She is only alert to her name.  She is attempting to eat her breakfast but does not have good appetite.  Denies any complaints.  Review of Systems Otherwise negative except as per HPI, including: General: Denies fever, chills, night sweats or unintended weight loss. Resp: Denies cough, wheezing, shortness of breath. Cardiac: Denies chest pain, palpitations, orthopnea, paroxysmal nocturnal dyspnea. GI: Denies abdominal pain, nausea, vomiting, diarrhea or constipation GU: Denies dysuria, frequency, hesitancy or incontinence MS: Denies muscle aches, joint pain or swelling Neuro: Denies headache, neurologic deficits (focal weakness, numbness, tingling), abnormal gait Psych: Denies anxiety, depression, SI/HI/AVH Skin: Denies new rashes or lesions ID: Denies sick contacts, exotic exposures, travel  Examination:  General exam: Appears calm and comfortable  Respiratory system: Clear to auscultation. Respiratory effort normal. Cardiovascular system: S1 & S2 heard, RRR. No JVD, murmurs, rubs, gallops or clicks. No pedal edema. Gastrointestinal system: Abdomen is nondistended, soft and nontender. No organomegaly or masses felt. Normal bowel sounds heard. Central nervous system:  No focal neurological deficits.  Extremities: Symmetric 5 x 5 power. Skin: No rashes, lesions or ulcers Psychiatry: Poor judgment and insight.  Alert to her name only.     Objective: Vitals:   04/22/21 0100 04/22/21 0531 04/22/21 0900 04/22/21 1130  BP: 100/67 103/68 135/73 116/78  Pulse: 61 (!) 51 64 65  Resp: '11  10 12 16  '$ Temp:      TempSrc:      SpO2: 96% 100% 98% 99%   No intake or output data in the 24 hours ending 04/22/21 1202 There were no vitals filed for this visit.   Data Reviewed:   CBC: Recent Labs  Lab 04/21/21 2207 04/22/21 0500  WBC 6.5 4.4  NEUTROABS 4.3 2.4  HGB 11.5* 10.9*  HCT 36.8 35.3*  MCV 91.8 92.9  PLT 156 [id number]*   Basic Metabolic Panel: Recent Labs  Lab 04/21/21 2207 04/22/21 0500  NA 145 145  K 3.6 3.8  CL 111 114*  CO2 27 27  GLUCOSE 130* 84  BUN 28* 23  CREATININE 0.60 0.40*  CALCIUM 9.0 8.7*  MG  --  2.0   GFR: CrCl cannot be calculated (Unknown ideal weight.). Liver Function Tests: Recent Labs  Lab 04/21/21 2207 04/22/21 0500  AST 11* 8*  ALT <5 5  ALKPHOS 62 65  BILITOT 0.6 0.7  PROT 5.7* 5.4*  ALBUMIN 3.7 3.3*   No results for input(s): LIPASE, AMYLASE in the last 168 hours. Recent Labs  Lab 04/22/21 0500  AMMONIA 29   Coagulation Profile: No results for input(s): INR, PROTIME in the last 168 hours. Cardiac Enzymes: No results for input(s): CKTOTAL, CKMB, CKMBINDEX, TROPONINI in the last 168 hours. BNP (last 3 results) No results for input(s): PROBNP in the last 8760 hours. HbA1C: No results for input(s): HGBA1C in the last 72 hours. CBG: Recent Labs  Lab 04/21/21 2237  GLUCAP 151*   Lipid Profile: No results for input(s): CHOL, HDL, LDLCALC, TRIG, CHOLHDL, LDLDIRECT in the last 72 hours. Thyroid Function Tests: Recent Labs    04/22/21 0500  TSH 3.179   Anemia Panel: Recent Labs    04/22/21 0500  VITAMINB12 284  FOLATE 8.3   Sepsis Labs: No results for input(s): PROCALCITON, LATICACIDVEN in the last 168 hours.  Recent Results (from the past 240 hour(s))  Resp Panel by RT-PCR (Flu A&B, Covid) Nasopharyngeal Swab     Status: None   Collection Time: 04/21/21 10:22 PM   Specimen: Nasopharyngeal Swab; Nasopharyngeal(NP) swabs in vial transport medium  Result Value Ref Range Status   SARS Coronavirus 2 by RT  PCR NEGATIVE NEGATIVE Final    Comment: (NOTE) SARS-CoV-2 target nucleic acids are NOT DETECTED.  The SARS-CoV-2 RNA is generally detectable in upper respiratory specimens during the acute phase of infection. The lowest concentration of SARS-CoV-2 viral copies this assay can detect is 138 copies/mL. A negative result does not preclude SARS-Cov-2 infection and should not be used as the sole basis for treatment or other patient management decisions. A negative result may occur with  improper specimen collection/handling, submission of specimen other than nasopharyngeal swab, presence of viral mutation(s) within the areas targeted by this assay, and inadequate number of viral copies(<138 copies/mL). A negative result must be combined with clinical observations, patient history, and epidemiological information. The expected result is Negative.  Fact Sheet for Patients:  EntrepreneurPulse.com.au  Fact Sheet for Healthcare Providers:  IncredibleEmployment.be  This test is no t yet approved or cleared by the [place] FDA and  has been  authorized for detection and/or diagnosis of SARS-CoV-2 by FDA under an Emergency Use Authorization (EUA). This EUA will remain  in effect (meaning this test can be used) for the duration of the COVID-19 declaration under Section 564(b)(1) of the Act, 21 U.S.C.section 360bbb-3(b)(1), unless the authorization is terminated  or revoked sooner.       Influenza A by PCR NEGATIVE NEGATIVE Final   Influenza B by PCR NEGATIVE NEGATIVE Final    Comment: (NOTE) The Xpert Xpress SARS-CoV-2/FLU/RSV plus assay is intended as an aid in the diagnosis of influenza from Nasopharyngeal swab specimens and should not be used as a sole basis for treatment. Nasal washings and aspirates are unacceptable for Xpert Xpress SARS-CoV-2/FLU/RSV testing.  Fact Sheet for Patients: EntrepreneurPulse.com.au  Fact Sheet for  Healthcare Providers: IncredibleEmployment.be  This test is not yet approved or cleared by the [place] FDA and has been authorized for detection and/or diagnosis of SARS-CoV-2 by FDA under an Emergency Use Authorization (EUA). This EUA will remain in effect (meaning this test can be used) for the duration of the COVID-19 declaration under Section 564(b)(1) of the Act, 21 U.S.C. section 360bbb-3(b)(1), unless the authorization is terminated or revoked.  Performed at [facility], [place] [address] [place], [place] [address]          Radiology Studies: CT HEAD WO CONTRAST (5MM)  Result Date: 04/21/2021 CLINICAL DATA:  Altered.  Found on floor. EXAM: CT HEAD WITHOUT CONTRAST CT CERVICAL SPINE WITHOUT CONTRAST TECHNIQUE: Multidetector CT imaging of the head and cervical spine was performed following the standard protocol without intravenous contrast. Multiplanar CT image reconstructions of the cervical spine were also generated. COMPARISON:  None. FINDINGS: CT HEAD FINDINGS Brain: No evidence of large-territorial acute infarction. No parenchymal hemorrhage. No mass lesion. No extra-axial collection. No mass effect or midline shift. No hydrocephalus. Basilar cisterns are patent. Vascular: No hyperdense vessel. Skull: No acute fracture or focal lesion. Sinuses/Orbits: Paranasal sinuses and mastoid air cells are clear. Left lens replacement and scleral buckle surgery. Otherwise orbits are unremarkable. Other: None. CT CERVICAL SPINE FINDINGS Alignment: Grade 1 anterolisthesis of C7 on T1. Skull base and vertebrae: Multilevel degenerative changes of the spine. Associated at least moderate multilevel osseous neural foraminal stenosis. No severe osseous central canal stenosis. No acute fracture. No aggressive appearing focal osseous lesion or focal pathologic process. Soft tissues and spinal canal: No prevertebral fluid or swelling. No visible canal hematoma.  Upper chest: Right apical pulmonary micronodule. Other: None. IMPRESSION: 1. No acute intracranial abnormality. 2. No acute displaced fracture or traumatic listhesis of the cervical spine. Electronically Signed   By: Iven Finn M.D.   On: 04/21/2021 22:34   CT Cervical Spine Wo Contrast  Result Date: 04/21/2021 CLINICAL DATA:  Altered.  Found on floor. EXAM: CT HEAD WITHOUT CONTRAST CT CERVICAL SPINE WITHOUT CONTRAST TECHNIQUE: Multidetector CT imaging of the head and cervical spine was performed following the standard protocol without intravenous contrast. Multiplanar CT image reconstructions of the cervical spine were also generated. COMPARISON:  None. FINDINGS: CT HEAD FINDINGS Brain: No evidence of large-territorial acute infarction. No parenchymal hemorrhage. No mass lesion. No extra-axial collection. No mass effect or midline shift. No hydrocephalus. Basilar cisterns are patent. Vascular: No hyperdense vessel. Skull: No acute fracture or focal lesion. Sinuses/Orbits: Paranasal sinuses and mastoid air cells are clear. Left lens replacement and scleral buckle surgery. Otherwise orbits are unremarkable. Other: None. CT CERVICAL SPINE FINDINGS Alignment: Grade 1 anterolisthesis of C7 on T1. Skull base  and vertebrae: Multilevel degenerative changes of the spine. Associated at least moderate multilevel osseous neural foraminal stenosis. No severe osseous central canal stenosis. No acute fracture. No aggressive appearing focal osseous lesion or focal pathologic process. Soft tissues and spinal canal: No prevertebral fluid or swelling. No visible canal hematoma. Upper chest: Right apical pulmonary micronodule. Other: None. IMPRESSION: 1. No acute intracranial abnormality. 2. No acute displaced fracture or traumatic listhesis of the cervical spine. Electronically Signed   By: Iven Finn M.D.   On: 04/21/2021 22:34   DG Chest Portable 1 View  Result Date: 04/21/2021 CLINICAL DATA:  Altered mental  status. EXAM: PORTABLE CHEST 1 VIEW COMPARISON:  Chest x-ray 10/24/2020. FINDINGS: The heart size and mediastinal contours are within normal limits. Both lungs are clear. The visualized skeletal structures are unremarkable. IMPRESSION: No active disease. Electronically Signed   By: Ronney Asters M.D.   On: 04/21/2021 22:38        Scheduled Meds:  ARIPiprazole  5 mg Oral Daily   carbidopa-levodopa  2 tablet Oral 4 times per day   carbidopa-levodopa  3 tablet Oral 3 times per day   clonazePAM  0.5 mg Oral QID   enoxaparin (LOVENOX) injection  40 mg Subcutaneous Q24H   entacapone  200 mg Oral 7 times per day   escitalopram  20 mg Oral Daily   pantoprazole  40 mg Oral Daily   prednisoLONE acetate  1 drop Left Eye Daily   pregabalin  100 mg Oral TID   risperiDONE  0.5 mg Oral BID   Continuous Infusions:  lactated ringers 75 mL/hr at 04/22/21 0823     LOS: 0 days   Time spent= 35 mins    Jovannie Ulibarri Arsenio Loader, MD [facility]  If 7PM-7AM, please contact night-coverage  04/22/2021, 12:02 PM

## 2021-04-22 NOTE — Plan of Care (Signed)

## 2021-04-23 DIAGNOSIS — F0280 Dementia in other diseases classified elsewhere without behavioral disturbance: Secondary | ICD-10-CM

## 2021-04-23 DIAGNOSIS — G20 Parkinson's disease: Principal | ICD-10-CM

## 2021-04-23 LAB — CBC
HCT: 39.8 % (ref 36.0–46.0)
Hemoglobin: 12.4 g/dL (ref 12.0–15.0)
MCH: 28.1 pg (ref 26.0–34.0)
MCHC: 31.2 g/dL (ref 30.0–36.0)
MCV: 90.2 fL (ref 80.0–100.0)
Platelets: 153 10*3/uL (ref 150–400)
RBC: 4.41 MIL/uL (ref 3.87–5.11)
RDW: 13.8 % (ref 11.5–15.5)
WBC: 6.1 10*3/uL (ref 4.0–10.5)
nRBC: 0 % (ref 0.0–0.2)

## 2021-04-23 LAB — BASIC METABOLIC PANEL
Anion gap: 6 (ref 5–15)
BUN: 12 mg/dL (ref 8–23)
CO2: 30 mmol/L (ref 22–32)
Calcium: 8.8 mg/dL — ABNORMAL LOW (ref 8.9–10.3)
Chloride: 107 mmol/L (ref 98–111)
Creatinine, Ser: 0.52 mg/dL (ref 0.44–1.00)
GFR, Estimated: >60 mL/min (ref >60)
Glucose, Bld: 97 mg/dL (ref 70–99)
Potassium: 3.7 mmol/L (ref 3.5–5.1)
Sodium: 143 mmol/L (ref 135–145)

## 2021-04-23 LAB — MAGNESIUM: Magnesium: 2 mg/dL (ref 1.7–2.4)

## 2021-04-23 NOTE — Discharge Summary (Signed)
Physician Discharge Summary  Sarah Donaldson [id number] DOB: 11/04/1950 DOA: 04/21/2021  PCP: Cletis Athens, MD  Admit date: 04/21/2021 Discharge date: 04/23/2021  Admitted From: [facility] assisted [facility] Disposition: Assisted living  Recommendations for Outpatient Follow-up:  Follow up with PCP in 1-2 weeks Please obtain BMP/CBC in one week your next doctors visit.    Discharge Condition: Stable CODE STATUS: DNR Diet recommendation: Regular  Brief/Interim Summary: 70 year old with history of Parkinson's disease, dementia, HTN, low-grade non-Hodgkin's lymphoma, GERD was brought to the hospital from her assisted living facility [facility] for evaluation of combative behavior and agitation.  Upon admission her routine blood work and vital signs were unremarkable.  No evidence of infection was noted, likely behavioral disruption was secondary to delirium. Work-up in the hospital was negative for any infection or any other pathology.  Her altered mental status was deemed to be secondary to delirium. Patient is stable for discharge today.    Delirium with underlying dementia, delirium has resolved - Patient's delirium has resolved.  In-hospital work-up including any infection is negative.  History of Parkinson's disease - Continue home medication including Sinemet  GERD - PPI    There is no height or weight on file to calculate BMI.  Pressure Injury 10/18/20 Sacrum Left;Medial Stage 2 -  Partial thickness loss of dermis presenting as a shallow open injury with a red, pink wound bed without slough. two small stage 2 pressure injuries on sacrum with surrounding erythema (Active)  10/18/20 1709  Location: Sacrum  Location Orientation: Left;Medial  Staging: Stage 2 -  Partial thickness loss of dermis presenting as a shallow open injury with a red, pink wound bed without slough.  Wound Description (Comments): two small stage 2 pressure injuries on sacrum with surrounding  erythema  Present on Admission: Yes        Discharge Diagnoses:  Principal Problem:   Altered mental status Active Problems:   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[place])   Essential hypertension   GERD without esophagitis   AMS (altered mental status)    Subjective: No complaints this morning, no acute events overnight  Discharge Exam: Vitals:   04/23/21 0509 04/23/21 1219  BP: 120/70 106/66  Pulse: 64 74  Resp: 18 20  Temp: 97.6 F (36.4 C)   SpO2: 95% 96%   Vitals:   04/22/21 2045 04/23/21 0059 04/23/21 0509 04/23/21 1219  BP: 119/69 119/72 120/70 106/66  Pulse: 72 68 64 74  Resp:  '18 18 20  '$ Temp: 97.7 F (36.5 C) 98 F (36.7 C) 97.6 F (36.4 C)   TempSrc: Axillary Axillary Axillary   SpO2: 95% 97% 95% 96%    General: Pt is alert, awake, not in acute distress Cardiovascular: RRR, S1/S2 +, no rubs, no gallops Respiratory: CTA bilaterally, no wheezing, no rhonchi Abdominal: Soft, NT, ND, bowel sounds + Extremities: no edema, no cyanosis Alert to name and place-Baseline  Discharge Instructions   Allergies as of 04/23/2021       Reactions   Percocet [oxycodone-acetaminophen]    Hallucination   Baclofen    Caused numbness in mouth    Codeine Sulfate Nausea And Vomiting   Gabapentin Swelling   Mouth swelling    Terazosin    Tramadol Hcl    Interferes with Serotonin levels which affects her Parkinsons        Medication List     TAKE these medications    ARIPiprazole 5 MG tablet Commonly known as: ABILIFY Take 5 mg by mouth  daily.   carbidopa-levodopa 25-100 MG tablet Commonly known as: SINEMET IR Take 2-3 tablets by mouth See admin instructions. 2 tablets at 8am, 10am, and 12pm, then 3 tablets at 2pm, 4pm, and 6pm, then 2 tablets at 8pm.   clonazePAM 2 MG tablet Commonly known as: KLONOPIN Take 1 mg by mouth See admin instructions. '1mg'$  by mouth at 8am, 12pm, 4pm, and 8pm scheduled.   DentaGel 1.1 % Gel dental gel Generic  drug: sodium fluoride Take by mouth. Apply 1 drop per teeth into fluoride tray an wear nightly   entacapone 200 MG tablet Commonly known as: COMTAN Take 200 mg by mouth See admin instructions. Take 1 tablet by mouth 7 times daily (8 am, 10 am, 12 pm, 2 pm, 4 pm, 6 pm, and 8 pm).   escitalopram 20 MG tablet Commonly known as: LEXAPRO Take 20 mg by mouth daily.   HYDROcodone-acetaminophen 5-325 MG tablet Commonly known as: NORCO/VICODIN Take 1 tablet by mouth 2 (two) times daily as needed for moderate pain. (take at lunch and bedtime)   melatonin 3 MG Tabs tablet Take 3 mg by mouth at bedtime.   morphine 15 MG 12 hr tablet Commonly known as: MS CONTIN Take 15 mg by mouth every 12 (twelve) hours.   omeprazole 10 MG capsule Commonly known as: PRILOSEC Take 10 mg by mouth daily.   potassium chloride 10 MEQ tablet Commonly known as: KLOR-CON Take 10 mEq by mouth daily.   prednisoLONE acetate 1 % ophthalmic suspension Commonly known as: PRED FORTE Place 1 drop into the left eye daily.   pregabalin 100 MG capsule Commonly known as: LYRICA Take 100 mg by mouth 3 (three) times daily.   QUEtiapine 100 MG tablet Commonly known as: SEROQUEL Take 1 tablet (100 mg total) by mouth at bedtime.   QUEtiapine 50 MG tablet Commonly known as: SEROQUEL Take 1 tablet (50 mg total) by mouth 3 (three) times daily.   risperiDONE 0.5 MG tablet Commonly known as: RISPERDAL Take 0.5 mg by mouth 2 (two) times daily.   senna-docusate 8.6-50 MG tablet Commonly known as: Senokot-S Take 2 tablets by mouth 2 (two) times daily. Hold if loose stoold   traZODone 50 MG tablet Commonly known as: DESYREL Take 50 mg by mouth at bedtime.        Follow-up Information     Cletis Athens, MD Follow up in 1 week(s).   Specialties: Internal Medicine, Cardiology Contact information: [address] [place] [address] [phone]                Allergies  Allergen Reactions   Percocet  [Oxycodone-Acetaminophen]     Hallucination   Baclofen     Caused numbness in mouth    Codeine Sulfate Nausea And Vomiting   Gabapentin Swelling    Mouth swelling    Terazosin    Tramadol Hcl     Interferes with Serotonin levels which affects her Parkinsons    You were cared for by a hospitalist during your hospital stay. If you have any questions about your discharge medications or the care you received while you were in the hospital after you are discharged, you can call the unit and asked to speak with the hospitalist on call if the hospitalist that took care of you is not available. Once you are discharged, your primary care physician will handle any further medical issues. Please note that no refills for any discharge medications will be authorized once you are discharged, as it  is imperative that you return to your primary care physician (or establish a relationship with a primary care physician if you do not have one) for your aftercare needs so that they can reassess your need for medications and monitor your lab values.   Procedures/Studies: CT HEAD WO CONTRAST (5MM)  Result Date: 04/21/2021 CLINICAL DATA:  Altered.  Found on floor. EXAM: CT HEAD WITHOUT CONTRAST CT CERVICAL SPINE WITHOUT CONTRAST TECHNIQUE: Multidetector CT imaging of the head and cervical spine was performed following the standard protocol without intravenous contrast. Multiplanar CT image reconstructions of the cervical spine were also generated. COMPARISON:  None. FINDINGS: CT HEAD FINDINGS Brain: No evidence of large-territorial acute infarction. No parenchymal hemorrhage. No mass lesion. No extra-axial collection. No mass effect or midline shift. No hydrocephalus. Basilar cisterns are patent. Vascular: No hyperdense vessel. Skull: No acute fracture or focal lesion. Sinuses/Orbits: Paranasal sinuses and mastoid air cells are clear. Left lens replacement and scleral buckle surgery. Otherwise orbits are unremarkable.  Other: None. CT CERVICAL SPINE FINDINGS Alignment: Grade 1 anterolisthesis of C7 on T1. Skull base and vertebrae: Multilevel degenerative changes of the spine. Associated at least moderate multilevel osseous neural foraminal stenosis. No severe osseous central canal stenosis. No acute fracture. No aggressive appearing focal osseous lesion or focal pathologic process. Soft tissues and spinal canal: No prevertebral fluid or swelling. No visible canal hematoma. Upper chest: Right apical pulmonary micronodule. Other: None. IMPRESSION: 1. No acute intracranial abnormality. 2. No acute displaced fracture or traumatic listhesis of the cervical spine. Electronically Signed   By: Iven Finn M.D.   On: 04/21/2021 22:34   CT Cervical Spine Wo Contrast  Result Date: 04/21/2021 CLINICAL DATA:  Altered.  Found on floor. EXAM: CT HEAD WITHOUT CONTRAST CT CERVICAL SPINE WITHOUT CONTRAST TECHNIQUE: Multidetector CT imaging of the head and cervical spine was performed following the standard protocol without intravenous contrast. Multiplanar CT image reconstructions of the cervical spine were also generated. COMPARISON:  None. FINDINGS: CT HEAD FINDINGS Brain: No evidence of large-territorial acute infarction. No parenchymal hemorrhage. No mass lesion. No extra-axial collection. No mass effect or midline shift. No hydrocephalus. Basilar cisterns are patent. Vascular: No hyperdense vessel. Skull: No acute fracture or focal lesion. Sinuses/Orbits: Paranasal sinuses and mastoid air cells are clear. Left lens replacement and scleral buckle surgery. Otherwise orbits are unremarkable. Other: None. CT CERVICAL SPINE FINDINGS Alignment: Grade 1 anterolisthesis of C7 on T1. Skull base and vertebrae: Multilevel degenerative changes of the spine. Associated at least moderate multilevel osseous neural foraminal stenosis. No severe osseous central canal stenosis. No acute fracture. No aggressive appearing focal osseous lesion or focal  pathologic process. Soft tissues and spinal canal: No prevertebral fluid or swelling. No visible canal hematoma. Upper chest: Right apical pulmonary micronodule. Other: None. IMPRESSION: 1. No acute intracranial abnormality. 2. No acute displaced fracture or traumatic listhesis of the cervical spine. Electronically Signed   By: Iven Finn M.D.   On: 04/21/2021 22:34   DG Chest Portable 1 View  Result Date: 04/21/2021 CLINICAL DATA:  Altered mental status. EXAM: PORTABLE CHEST 1 VIEW COMPARISON:  Chest x-ray 10/24/2020. FINDINGS: The heart size and mediastinal contours are within normal limits. Both lungs are clear. The visualized skeletal structures are unremarkable. IMPRESSION: No active disease. Electronically Signed   By: Ronney Asters M.D.   On: 04/21/2021 22:38     The results of significant diagnostics from this hospitalization (including imaging, microbiology, ancillary and laboratory) are listed below for reference.  Microbiology: Recent Results (from the past 240 hour(s))  Resp Panel by RT-PCR (Flu A&B, Covid) Nasopharyngeal Swab     Status: None   Collection Time: 04/21/21 10:22 PM   Specimen: Nasopharyngeal Swab; Nasopharyngeal(NP) swabs in vial transport medium  Result Value Ref Range Status   SARS Coronavirus 2 by RT PCR NEGATIVE NEGATIVE Final    Comment: (NOTE) SARS-CoV-2 target nucleic acids are NOT DETECTED.  The SARS-CoV-2 RNA is generally detectable in upper respiratory specimens during the acute phase of infection. The lowest concentration of SARS-CoV-2 viral copies this assay can detect is 138 copies/mL. A negative result does not preclude SARS-Cov-2 infection and should not be used as the sole basis for treatment or other patient management decisions. A negative result may occur with  improper specimen collection/handling, submission of specimen other than nasopharyngeal swab, presence of viral mutation(s) within the areas targeted by this assay, and  inadequate number of viral copies(<138 copies/mL). A negative result must be combined with clinical observations, patient history, and epidemiological information. The expected result is Negative.  Fact Sheet for Patients:  EntrepreneurPulse.com.au  Fact Sheet for Healthcare Providers:  IncredibleEmployment.be  This test is no t yet approved or cleared by the [place] FDA and  has been authorized for detection and/or diagnosis of SARS-CoV-2 by FDA under an Emergency Use Authorization (EUA). This EUA will remain  in effect (meaning this test can be used) for the duration of the COVID-19 declaration under Section 564(b)(1) of the Act, 21 U.S.C.section 360bbb-3(b)(1), unless the authorization is terminated  or revoked sooner.       Influenza A by PCR NEGATIVE NEGATIVE Final   Influenza B by PCR NEGATIVE NEGATIVE Final    Comment: (NOTE) The Xpert Xpress SARS-CoV-2/FLU/RSV plus assay is intended as an aid in the diagnosis of influenza from Nasopharyngeal swab specimens and should not be used as a sole basis for treatment. Nasal washings and aspirates are unacceptable for Xpert Xpress SARS-CoV-2/FLU/RSV testing.  Fact Sheet for Patients: EntrepreneurPulse.com.au  Fact Sheet for Healthcare Providers: IncredibleEmployment.be  This test is not yet approved or cleared by the [place] FDA and has been authorized for detection and/or diagnosis of SARS-CoV-2 by FDA under an Emergency Use Authorization (EUA). This EUA will remain in effect (meaning this test can be used) for the duration of the COVID-19 declaration under Section 564(b)(1) of the Act, 21 U.S.C. section 360bbb-3(b)(1), unless the authorization is terminated or revoked.  Performed at [facility], [place] [address] [place],  [address]      Labs: BNP (last 3 results) No results for input(s): BNP in the last 8760  hours. Basic Metabolic Panel: Recent Labs  Lab 04/21/21 2207 04/22/21 0500 04/23/21 0444  NA 145 145 143  K 3.6 3.8 3.7  CL 111 114* 107  CO2 '27 27 30  '$ GLUCOSE 130* 84 97  BUN 28* 23 12  CREATININE 0.60 0.40* 0.52  CALCIUM 9.0 8.7* 8.8*  MG  --  2.0 2.0   Liver Function Tests: Recent Labs  Lab 04/21/21 2207 04/22/21 0500  AST 11* 8*  ALT <5 5  ALKPHOS 62 65  BILITOT 0.6 0.7  PROT 5.7* 5.4*  ALBUMIN 3.7 3.3*   No results for input(s): LIPASE, AMYLASE in the last 168 hours. Recent Labs  Lab 04/22/21 0500  AMMONIA 29   CBC: Recent Labs  Lab 04/21/21 2207 04/22/21 0500 04/23/21 0444  WBC 6.5 4.4 6.1  NEUTROABS 4.3 2.4  --   HGB 11.5*  10.9* 12.4  HCT 36.8 35.3* 39.8  MCV 91.8 92.9 90.2  PLT 156 130* 153   Cardiac Enzymes: No results for input(s): CKTOTAL, CKMB, CKMBINDEX, TROPONINI in the last 168 hours. BNP: Invalid input(s): POCBNP CBG: Recent Labs  Lab 04/21/21 2237  GLUCAP 151*   D-Dimer No results for input(s): DDIMER in the last 72 hours. Hgb A1c No results for input(s): HGBA1C in the last 72 hours. Lipid Profile No results for input(s): CHOL, HDL, LDLCALC, TRIG, CHOLHDL, LDLDIRECT in the last 72 hours. Thyroid function studies Recent Labs    04/22/21 0500  TSH 3.179   Anemia work up Recent Labs    04/22/21 0500  VITAMINB12 284  FOLATE 8.3   Urinalysis    Component Value Date/Time   COLORURINE ORANGE (A) 04/21/2021 2300   APPEARANCEUR CLEAR 04/21/2021 2300   LABSPEC >1.030 (H) 04/21/2021 2300   PHURINE 5.5 04/21/2021 2300   GLUCOSEU NEGATIVE 04/21/2021 2300   HGBUR NEGATIVE 04/21/2021 2300   BILIRUBINUR NEGATIVE 04/21/2021 2300   BILIRUBINUR '1mg'$  01/05/2020 0933   KETONESUR 15 (A) 04/21/2021 2300   PROTEINUR NEGATIVE 04/21/2021 2300   UROBILINOGEN 4.0 (A) 01/05/2020 0933   NITRITE NEGATIVE 04/21/2021 2300   LEUKOCYTESUR NEGATIVE 04/21/2021 2300   Sepsis Labs Invalid input(s): PROCALCITONIN,  WBC,   LACTICIDVEN Microbiology Recent Results (from the past 240 hour(s))  Resp Panel by RT-PCR (Flu A&B, Covid) Nasopharyngeal Swab     Status: None   Collection Time: 04/21/21 10:22 PM   Specimen: Nasopharyngeal Swab; Nasopharyngeal(NP) swabs in vial transport medium  Result Value Ref Range Status   SARS Coronavirus 2 by RT PCR NEGATIVE NEGATIVE Final    Comment: (NOTE) SARS-CoV-2 target nucleic acids are NOT DETECTED.  The SARS-CoV-2 RNA is generally detectable in upper respiratory specimens during the acute phase of infection. The lowest concentration of SARS-CoV-2 viral copies this assay can detect is 138 copies/mL. A negative result does not preclude SARS-Cov-2 infection and should not be used as the sole basis for treatment or other patient management decisions. A negative result may occur with  improper specimen collection/handling, submission of specimen other than nasopharyngeal swab, presence of viral mutation(s) within the areas targeted by this assay, and inadequate number of viral copies(<138 copies/mL). A negative result must be combined with clinical observations, patient history, and epidemiological information. The expected result is Negative.  Fact Sheet for Patients:  EntrepreneurPulse.com.au  Fact Sheet for Healthcare Providers:  IncredibleEmployment.be  This test is no t yet approved or cleared by the [place] FDA and  has been authorized for detection and/or diagnosis of SARS-CoV-2 by FDA under an Emergency Use Authorization (EUA). This EUA will remain  in effect (meaning this test can be used) for the duration of the COVID-19 declaration under Section 564(b)(1) of the Act, 21 U.S.C.section 360bbb-3(b)(1), unless the authorization is terminated  or revoked sooner.       Influenza A by PCR NEGATIVE NEGATIVE Final   Influenza B by PCR NEGATIVE NEGATIVE Final    Comment: (NOTE) The Xpert Xpress SARS-CoV-2/FLU/RSV plus  assay is intended as an aid in the diagnosis of influenza from Nasopharyngeal swab specimens and should not be used as a sole basis for treatment. Nasal washings and aspirates are unacceptable for Xpert Xpress SARS-CoV-2/FLU/RSV testing.  Fact Sheet for Patients: EntrepreneurPulse.com.au  Fact Sheet for Healthcare Providers: IncredibleEmployment.be  This test is not yet approved or cleared by the [place] FDA and has been authorized for detection and/or diagnosis of SARS-CoV-2 by FDA  under an Emergency Use Authorization (EUA). This EUA will remain in effect (meaning this test can be used) for the duration of the COVID-19 declaration under Section 564(b)(1) of the Act, 21 U.S.C. section 360bbb-3(b)(1), unless the authorization is terminated or revoked.  Performed at [facility], [place] [address], [place], [place] [address]      Time coordinating discharge:  I have spent 35 minutes face to face with the patient and on the ward discussing the patients care, assessment, plan and disposition with other care givers. >50% of the time was devoted counseling the patient about the risks and benefits of treatment/Discharge disposition and coordinating care.   SIGNED:   Damita Lack, MD  [facility] 04/23/2021, 1:36 PM   If 7PM-7AM, please contact night-coverage

## 2021-04-23 NOTE — TOC Transition Note (Signed)
Transition of Care [facility]) - CM/SW Discharge Note   Patient Details  Name: Sarah Donaldson MRN: [id number] Date of Birth: [birth year]  [facility]) CM/SW Contact:  Leeroy Cha, RN Phone Number: 04/23/2021, 12:06 PM   Clinical Narrative:    [facility]ct-[facility]/ ok to send back. Floor notified.   Final next level of care: Assisted Living Barriers to Discharge: No Barriers Identified   Patient Goals and CMS Choice        Discharge Placement                       Discharge Plan and Services                                     Social Determinants of Health (SDOH) Interventions     Readmission Risk Interventions No flowsheet data found.

## 2021-04-23 NOTE — TOC Transition Note (Signed)
Transition of Care [facility]) - CM/SW Discharge Note   Patient Details  Name: Sarah Donaldson MRN: [id number] Date of Birth: [birth year]  Transition [facility] Care [facility]) CM/SW Contact:  Leeroy Cha, RN Phone Number: 04/23/2021, 1:56 PM   Clinical Narrative:    Patient transported back to [facility] via [facility]tar.   Final next level of care: Assisted Living Barriers to Discharge: No Barriers Identified   Patient Goals and CMS Choice   CMS Medicare.gov Compare Post Acute Care list provided to:: Patient Choice offered to / list presented to : Patient  Discharge Placement                       Discharge Plan and Services     Post Acute Care Choice:  (assisted living)                               Social Determinants of Health (SDOH) Interventions     Readmission Risk Interventions No flowsheet data found.

## 2021-05-23 ENCOUNTER — Encounter (HOSPITAL_COMMUNITY): Payer: Self-pay | Admitting: Emergency Medicine

## 2021-05-23 ENCOUNTER — Other Ambulatory Visit: Payer: Self-pay

## 2021-05-23 ENCOUNTER — Emergency Department (HOSPITAL_COMMUNITY)
Admission: EM | Admit: 2021-05-23 | Discharge: 2021-05-24 | Disposition: A | Discharge status: Home / Self Care | Payer: Medicare Other | Attending: Emergency Medicine | Admitting: Emergency Medicine

## 2021-05-23 ENCOUNTER — Emergency Department (HOSPITAL_COMMUNITY): Payer: Medicare Other

## 2021-05-23 DIAGNOSIS — M25561 Pain in right knee: Secondary | ICD-10-CM | POA: Insufficient documentation

## 2021-05-23 DIAGNOSIS — F0390 Unspecified dementia without behavioral disturbance: Secondary | ICD-10-CM | POA: Insufficient documentation

## 2021-05-23 DIAGNOSIS — M25569 Pain in unspecified knee: Secondary | ICD-10-CM

## 2021-05-23 DIAGNOSIS — R400 Somnolence: Secondary | ICD-10-CM | POA: Insufficient documentation

## 2021-05-23 DIAGNOSIS — R296 Repeated falls: Secondary | ICD-10-CM | POA: Insufficient documentation

## 2021-05-23 DIAGNOSIS — Z87891 Personal history of nicotine dependence: Secondary | ICD-10-CM | POA: Insufficient documentation

## 2021-05-23 DIAGNOSIS — Y92129 Unspecified place in nursing home as the place of occurrence of the external cause: Secondary | ICD-10-CM | POA: Diagnosis not present

## 2021-05-23 DIAGNOSIS — M25562 Pain in left knee: Secondary | ICD-10-CM | POA: Diagnosis not present

## 2021-05-23 DIAGNOSIS — G20 Parkinson's disease: Secondary | ICD-10-CM | POA: Insufficient documentation

## 2021-05-23 DIAGNOSIS — Z8572 Personal history of non-Hodgkin lymphomas: Secondary | ICD-10-CM | POA: Diagnosis not present

## 2021-05-23 DIAGNOSIS — W19XXXA Unspecified fall, initial encounter: Secondary | ICD-10-CM

## 2021-05-23 DIAGNOSIS — Z96652 Presence of left artificial knee joint: Secondary | ICD-10-CM | POA: Insufficient documentation

## 2021-05-23 DIAGNOSIS — W050XXA Fall from non-moving wheelchair, initial encounter: Secondary | ICD-10-CM | POA: Insufficient documentation

## 2021-05-23 NOTE — ED Provider Notes (Signed)
[facility] Emergency Department Provider Note MRN:  [id number]  Arrival date & time: 05/24/21     Chief Complaint   Fall   History of Present Illness   Sarah Donaldson is a 70 y.o. year-old female with a history of dementia, Parkinson's disease presenting to the ED with chief complaint of fall.  History obtained from EMS report and patient's husband over the phone.  Fall at facility, fell out of wheelchair, fell forward onto her knees.  No reported head trauma.  Multiple falls over the past week because patient has been transitioned from walker to wheelchair.  Her Parkinson's has progressed and taken away her ability to walk on her own.  She is having trouble excepting this new restriction and keeps trying to walk on her own.  Has been found on the ground multiple times this week.  Husband denies any recent fever or any other complaints or injuries.  Patient is overall not participatory, does not communicate at exam.  Per husband she can talk but does not always.  I was unable to obtain an accurate HPI, PMH, or ROS due to the patient's dementia.  Level 5 caveat. Review of Systems  Positive for fall, knee pain.  Patient's Health History    Past Medical History:  Diagnosis Date   Cancer ([place])    Non-hodgkin's lymphoma (in remission)   Dementia (HCC)    GERD (gastroesophageal reflux disease)    well-controlled w/ omeprazole   Hyperlipidemia    Knee joint cyst, left 02/07/2018   Parkinson disease ([place])    Parkinson disease ([place])     Past Surgical History:  Procedure Laterality Date   BACK SURGERY     BUNIONECTOMY     NASAL SEPTUM SURGERY     RETINAL DETACHMENT SURGERY     TOTAL KNEE ARTHROPLASTY Left 06/07/2019   Procedure: TOTAL KNEE ARTHROPLASTY;  Surgeon: Lovell Sheehan, MD;  Location: [facility] ORS;  Service: Orthopedics;  Laterality: Left;    Family History  Problem Relation Age of Onset   Heart disease Mother    Alcohol abuse Father    Breast  cancer Neg Hx    Mental illness Neg Hx     Social History   Socioeconomic History   Marital status: Married    Spouse name: Sarah Donaldson   Number of children: 4   Years of education: Not on file   Highest education level: Not on file  Occupational History    Comment: retired  Tobacco Use   Smoking status: Former   Smokeless tobacco: Never  Scientific laboratory technician Use: Never used  Substance and Sexual Activity   Alcohol use: Yes    Alcohol/week: 0.0 standard drinks   Drug use: No   Sexual activity: Not Currently  Other Topics Concern   Not on file  Social History Narrative   Not on file   Social Determinants of Health   Financial Resource Strain: Not on file  Food Insecurity: Not on file  Transportation Needs: Not on file  Physical Activity: Not on file  Stress: Not on file  Social Connections: Not on file  Intimate Partner Violence: Not on file     Physical Exam   Vitals:   05/23/21 2250  BP: 110/72  Pulse: 66  Resp: 16  Temp: 98 F (36.7 C)  SpO2: 93%    CONSTITUTIONAL: Well-appearing, NAD NEURO: Somnolent, wakes to voice but does not communicate, moves all extremities EYES:  eyes equal and  reactive ENT/NECK:  no LAD, no JVD CARDIO: Regular rate, well-perfused, normal S1 and S2 PULM:  CTAB no wheezing or rhonchi GI/GU:  normal bowel sounds, non-distended, non-tender MSK/SPINE:  No gross deformities, no edema, mild pain elicited with range of motion of the lateral malleolus SKIN:  no rash, atraumatic PSYCH:  Appropriate speech and behavior  *Additional and/or pertinent findings included in MDM below  Diagnostic and Interventional Summary    EKG Interpretation  Date/Time:    Ventricular Rate:    PR Interval:    QRS Duration:   QT Interval:    QTC Calculation:   R Axis:     Text Interpretation:         Labs Reviewed - No data to display  DG Knee Complete 4 Views Left  Final Result    DG Knee Complete 4 Views Right    (Results Pending)  CT  HEAD WO CONTRAST (5MM)    (Results Pending)    Medications - No data to display   Procedures  /  Critical Care Procedures  ED Course and Medical Decision Making  I have reviewed the triage vital signs, the nursing notes, and pertinent available records from the EMR.  Listed above are laboratory and imaging tests that I personally ordered, reviewed, and interpreted and then considered in my medical decision making (see below for details).  [place] [address] out of wheelchair today, multiple falls this week.  Seems to be mechanical falls in the setting of Parkinson's and worsening functional abilities.  Vital signs are normal, no fever, no abdominal tenderness, lungs clear, will obtain x-rays of the knees given the pain elicited with range of motion.  Patient is not alert or interactive on my exam, could be her baseline based on my discussion with patient's husband but given the multiple falls were obtain CT head to exclude intracranial bleeding.     Imaging reassuring, appropriate for discharge.  Barth Kirks. [place] Small, [place] [email or web address]  Final Clinical Impressions(s) / ED Diagnoses     ICD-10-CM   1. Fall, initial encounter  W19.XXXA     2. Acute knee pain, unspecified laterality  M25.569       ED Discharge Orders     None        Discharge Instructions Discussed with and Provided to Patient:     Discharge Instructions      You were evaluated in the Emergency Department and after careful evaluation, we did not find any emergent condition requiring admission or further testing in the hospital.  Your exam/testing today was overall reassuring.  Imaging did not show any broken bones or emergencies.  Please return to the Emergency Department if you experience any worsening of your condition.  Thank you for allowing [place] to be a part of your care.         Maudie Flakes, MD 05/24/21 [phone]

## 2021-05-23 NOTE — ED Triage Notes (Signed)
Pt BIB [facility] from [facility]. [place] [address] forward out of chair. Pt complaining of L knee pain. Pt has dementia and Parkinson's and is at baseline.

## 2021-05-24 ENCOUNTER — Other Ambulatory Visit (HOSPITAL_COMMUNITY)

## 2021-05-24 ENCOUNTER — Emergency Department (HOSPITAL_COMMUNITY): Payer: Medicare Other

## 2021-05-24 DIAGNOSIS — M25561 Pain in right knee: Secondary | ICD-10-CM | POA: Diagnosis not present

## 2021-05-24 NOTE — ED Notes (Signed)
Transport contacted for non emergency transfer back to [facility]. Patient is being discharged

## 2021-05-24 NOTE — Discharge Instructions (Signed)
You were evaluated in the Emergency Department and after careful evaluation, we did not find any emergent condition requiring admission or further testing in the hospital.  Your exam/testing today was overall reassuring.  Imaging did not show any broken bones or emergencies.  Please return to the Emergency Department if you experience any worsening of your condition.  Thank you for allowing [place] to be a part of your care.

## 2021-05-24 NOTE — ED Notes (Signed)
Spoke with a Med Tech at [facility] to inform them patient is discharged and will be transported back by [facility].

## 2021-05-26 ENCOUNTER — Emergency Department (HOSPITAL_COMMUNITY): Payer: Medicare Other

## 2021-05-26 ENCOUNTER — Emergency Department (HOSPITAL_COMMUNITY)
Admission: EM | Admit: 2021-05-26 | Discharge: 2021-05-27 | Disposition: A | Discharge status: Skilled Nursing Facility | Payer: Medicare Other | Attending: Emergency Medicine | Admitting: Emergency Medicine

## 2021-05-26 ENCOUNTER — Other Ambulatory Visit: Payer: Self-pay

## 2021-05-26 ENCOUNTER — Encounter (HOSPITAL_COMMUNITY): Payer: Self-pay

## 2021-05-26 DIAGNOSIS — M25559 Pain in unspecified hip: Secondary | ICD-10-CM | POA: Diagnosis not present

## 2021-05-26 DIAGNOSIS — I10 Essential (primary) hypertension: Secondary | ICD-10-CM | POA: Diagnosis not present

## 2021-05-26 DIAGNOSIS — Z87891 Personal history of nicotine dependence: Secondary | ICD-10-CM | POA: Diagnosis not present

## 2021-05-26 DIAGNOSIS — Z96652 Presence of left artificial knee joint: Secondary | ICD-10-CM | POA: Insufficient documentation

## 2021-05-26 DIAGNOSIS — G20 Parkinson's disease: Secondary | ICD-10-CM | POA: Diagnosis not present

## 2021-05-26 DIAGNOSIS — Z8572 Personal history of non-Hodgkin lymphomas: Secondary | ICD-10-CM | POA: Insufficient documentation

## 2021-05-26 DIAGNOSIS — F0390 Unspecified dementia without behavioral disturbance: Secondary | ICD-10-CM | POA: Insufficient documentation

## 2021-05-26 DIAGNOSIS — W19XXXA Unspecified fall, initial encounter: Secondary | ICD-10-CM | POA: Insufficient documentation

## 2021-05-26 DIAGNOSIS — R52 Pain, unspecified: Secondary | ICD-10-CM

## 2021-05-26 NOTE — ED Notes (Signed)
Patients husband would like a call back with an update: Thao Bauza [phone]

## 2021-05-26 NOTE — ED Notes (Signed)
Pt unable to stand straight up tall. PT requires two staff assist with walker

## 2021-05-26 NOTE — ED Provider Notes (Signed)
[facility] Provider Triage Evaluation Note  Sarah Donaldson , a 70 y.o. female  was evaluated in triage.  Patient has been sent from her living community with their concern for hip pain.  Patient fell and fractured her right hip.  When you ask her she says that she does not have any pain.  Review of Systems  Positive:  Negative: Hip or chest pain.  No difficulty breathing.  Physical Exam  There were no vitals taken for this visit. Gen:   Awake, no distress   Resp:  Normal effort  MSK:   Moves extremities without difficulty  Other:    Medical Decision Making  Medically screening exam initiated at 9:23 PM.  Appropriate orders placed.  Sarah Donaldson was informed that the remainder of the evaluation will be completed by another provider, this initial triage assessment does not replace that evaluation, and the importance of remaining in the ED until their evaluation is complete.     Darliss Ridgel 05/26/21 2133    Sherwood Gambler, MD 05/28/21 [phone]

## 2021-05-26 NOTE — Discharge Instructions (Signed)
There is no fracture of either hip. If you develop new or worsening pain, return to the ER.

## 2021-05-26 NOTE — ED Triage Notes (Signed)
Patient coming from [facility] complaining of hip pain. Had a fall a week ago, fractured her hip. They want her to get checked out. When EMS asked, she said she was not hurting anywhere but staff wanted her sent out to be evaluated again. Patient has dementia and Parkinsons.

## 2021-05-26 NOTE — ED Provider Notes (Signed)
[facility] DEPT Provider Note   CSN: [id number] Arrival date & time: 05/26/21  2030  LEVEL 5 CAVEAT - DEMENTIA   History Chief Complaint  Patient presents with   Hip Pain    Sarah Donaldson is a 70 y.o. female.  HPI 70 year old female was sent to the emergency department [facility] for [facility] of her hip.  Patient reportedly fell a couple days ago and was seen here.  Has had more difficulty walking since.  Unfortunately, the patient has dementia which limits the history.  I spoke to the med tech it was the only one who knew why she was sent to the hospital tonight.  However she was unclear except that they were concerned she might have hip injury so she was sent.  There have been no new falls since the fall few nights ago.  Patient denies any complaints while sitting in a wheelchair besides general "arthritis pain".  Past Medical History:  Diagnosis Date   Cancer ([place])    Non-hodgkin's lymphoma (in remission)   Dementia ([place])    GERD (gastroesophageal reflux disease)    well-controlled w/ omeprazole   Hyperlipidemia    Knee joint cyst, left 02/07/2018   Parkinson disease ([place])    Parkinson disease ([place])     Patient Active Problem List   Diagnosis Date Noted   GERD without esophagitis 04/22/2021   AMS (altered mental status) 04/22/2021   Pressure injury of skin 10/19/2020   UTI (urinary tract infection) 10/17/2020   SunDown syndrome 10/17/2020   Essential hypertension 06/02/2020   Need for influenza vaccination 06/02/2020   Encounter for annual health check of caregiver 04/08/2020   Altered mental status    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[place])    SDH (subdural hematoma) 12/11/2019   Falls frequently 12/11/2019   S/P total knee replacement, left 06/07/2019   Osteoarthritis of knee 04/27/2019   Panic attacks 04/07/2019   Psychosis ([place]) 04/04/2019   Numbness 03/09/2018   Lumbar spondylosis 03/07/2018   Chronic pain  of left knee 03/07/2018   Chronic upper back pain 12/08/2017   Benign neoplasm of hand 10/20/2017   Chronic pain syndrome 10/20/2017   Chronic pain of right lower extremity 10/20/2017   Chronic myofascial pain 10/20/2017   Numbness and tingling 07/12/2017   Sleep difficulties 07/12/2017   Low back pain 01/18/2017   Sleep behavior disorder, REM 01/18/2017   Lumbar radiculopathy 87/56/4332   Follicular lymphoma of extranodal and solid organ sites ([place]) 05/01/2016   Lymphoma ([place]) 04/30/2016   Hallux limitus 07/27/2015   Low back pain with sciatica 02/13/2015   Bilateral low back pain with sciatica 02/13/2015   H/O adenomatous polyp of colon 04/06/2014   Hx of adenomatous colonic polyps 04/06/2014   Back ache 01/25/2014   Adiposity 01/25/2014   REM sleep behavior disorder 01/25/2014   Disordered sleep 01/25/2014   Has a tremor 01/25/2014   Obesity, unspecified 01/25/2014   Sleep disorder 01/25/2014   Backache 01/25/2014   Tremor 01/25/2014   Obesity 01/25/2014   Difficulty hearing 06/08/2012   Hearing loss 06/08/2012   Anxiety disorder 06/06/2012   Colon polyp 06/06/2012   Clinical depression 06/06/2012   Hypercholesteremia 06/06/2012   Idiopathic Parkinson's disease ([place]) 06/06/2012   Detached retina 06/06/2012   Depressive disorder 06/06/2012   Retinal detachment 06/06/2012   Parkinson's disease ([place]) 06/06/2012    Past Surgical History:  Procedure Laterality Date   BACK SURGERY     BUNIONECTOMY  NASAL SEPTUM SURGERY     RETINAL DETACHMENT SURGERY     TOTAL KNEE ARTHROPLASTY Left 06/07/2019   Procedure: TOTAL KNEE ARTHROPLASTY;  Surgeon: Lovell Sheehan, MD;  Location: [facility] ORS;  Service: Orthopedics;  Laterality: Left;     OB History   No obstetric history on file.     Family History  Problem Relation Age of Onset   Heart disease Mother    Alcohol abuse Father    Breast cancer Neg Hx    Mental illness Neg Hx     Social History   Tobacco Use    Smoking status: Former   Smokeless tobacco: Never  Vaping Use   Vaping Use: Never used  Substance Use Topics   Alcohol use: Yes    Alcohol/week: 0.0 standard drinks   Drug use: No    Home Medications Prior to Admission medications   Medication Sig Start Date End Date Taking? Authorizing Provider  ARIPiprazole (ABILIFY) 5 MG tablet Take 5 mg by mouth daily. 04/15/21   [provider]  carbidopa-levodopa (SINEMET IR) 25-100 MG tablet Take 2-3 tablets by mouth See admin instructions. 2 tablets at 8am, 10am, and 12pm, then 3 tablets at 2pm, 4pm, and 6pm, then 2 tablets at 8pm.    [provider]  clonazePAM (KLONOPIN) 2 MG tablet Take 1 mg by mouth See admin instructions. 1mg  by mouth at 8am, 12pm, 4pm, and 8pm scheduled.    [provider]  DENTAGEL 1.1 % GEL dental gel Take by mouth. Apply 1 drop per teeth into fluoride tray an wear nightly 04/09/21   [provider]  entacapone (COMTAN) 200 MG tablet Take 200 mg by mouth See admin instructions. Take 1 tablet by mouth 7 times daily (8 am, 10 am, 12 pm, 2 pm, 4 pm, 6 pm, and 8 pm).    [provider]  escitalopram (LEXAPRO) 20 MG tablet Take 20 mg by mouth daily.    [provider]  HYDROcodone-acetaminophen (NORCO/VICODIN) 5-325 MG tablet Take 1 tablet by mouth 2 (two) times daily as needed for moderate pain. (take at lunch and bedtime) 10/20/20   Sharen Hones, MD  melatonin 3 MG TABS tablet Take 3 mg by mouth at bedtime.    [provider]  morphine (MS CONTIN) 15 MG 12 hr tablet Take 15 mg by mouth every 12 (twelve) hours.    [provider]  omeprazole (PRILOSEC) 10 MG capsule Take 10 mg by mouth daily.    [provider]  potassium chloride (KLOR-CON) 10 MEQ tablet Take 10 mEq by mouth daily.    [provider]  prednisoLONE acetate (PRED FORTE) 1 % ophthalmic suspension Place 1 drop into the left eye daily.    [provider]  pregabalin  (LYRICA) 100 MG capsule Take 100 mg by mouth 3 (three) times daily. 11/18/19   [provider]  QUEtiapine (SEROQUEL) 100 MG tablet Take 1 tablet (100 mg total) by mouth at bedtime. Patient not taking: Reported on 04/22/2021 08/08/20   Clapacs, Madie Reno, MD  QUEtiapine (SEROQUEL) 50 MG tablet Take 1 tablet (50 mg total) by mouth 3 (three) times daily. Patient not taking: Reported on 04/22/2021 08/08/20   Clapacs, Madie Reno, MD  risperiDONE (RISPERDAL) 0.5 MG tablet Take 0.5 mg by mouth 2 (two) times daily. 03/28/21   [provider]  senna-docusate (SENOKOT-S) 8.6-50 MG tablet Take 2 tablets by mouth 2 (two) times daily. Hold if loose stoold Patient not taking: Reported on  04/22/2021 10/20/20   Sharen Hones, MD  traZODone (DESYREL) 50 MG tablet Take 50 mg by mouth at bedtime. Patient not taking: Reported on 04/22/2021    [provider]    Allergies    Percocet [oxycodone-acetaminophen], Baclofen, Codeine sulfate, Gabapentin, Terazosin, and Tramadol hcl  Review of Systems   Review of Systems  Unable to perform ROS: Dementia   Physical Exam Updated Vital Signs BP 102/69   Pulse 65   Temp 97.7 F (36.5 C)   Resp 16   SpO2 98%   Physical Exam Vitals and nursing note reviewed.  Constitutional:      Appearance: She is well-developed.  HENT:     Head: Normocephalic and atraumatic.     Right Ear: External ear normal.     Left Ear: External ear normal.     Nose: Nose normal.  Eyes:     General:        Right eye: No discharge.        Left eye: No discharge.  Cardiovascular:     Rate and Rhythm: Normal rate and regular rhythm.     Pulses:          Dorsalis pedis pulses are 2+ on the right side and 2+ on the left side.  Pulmonary:     Effort: Pulmonary effort is normal.  Abdominal:     General: There is no distension.  Musculoskeletal:     Right hip: No tenderness. Normal range of motion.     Left hip: No tenderness. Normal range of motion.     Right knee: No  tenderness.     Left knee: No tenderness.  Skin:    General: Skin is warm and dry.  Neurological:     Mental Status: She is alert.  Psychiatric:        Mood and Affect: Mood is not anxious.    ED Results / Procedures / Treatments   Labs (all labs ordered are listed, but only abnormal results are displayed) Labs Reviewed - No data to display  EKG None  Radiology DG HIPS BILAT WITH PELVIS MIN 5 VIEWS  Result Date: 05/26/2021 CLINICAL DATA:  Fall, hip pain EXAM: DG HIP (WITH OR WITHOUT PELVIS) 5+V BILAT COMPARISON:  None. FINDINGS: No fracture or dislocation is seen. Mild degenerative changes of the bilateral hips. Visualized bony pelvis appears intact. Degenerative changes of the lower lumbar spine. Lumbar spine fixation hardware, incompletely visualized. IMPRESSION: No fracture or dislocation is seen. Degenerative changes of the lumbar spine and bilateral hips. Electronically Signed   By: Julian Hy M.D.   On: 05/26/2021 23:15    Procedures Procedures   Medications Ordered in ED Medications - No data to display  ED Course  I have reviewed the triage vital signs and the nursing notes.  Pertinent labs & imaging results that were available during my care of the patient were reviewed by me and considered in my medical decision making (see chart for details).    MDM Rules/Calculators/A&P                           Patient has no complaints, and no reproducible tenderness on exam.  X-rays were obtained of both hips and are negative.  She is able to stand and while she can walk very well she seems to be able to bear weight.  I discussed with the husband who states that the facility took multiple x-rays of her hips  and diagnosed her with bilateral femur fractures.  This seems unlikely, especially with the negative x-rays today and her ability to stand and bear weight.  She been having trouble with walking due to Parkinson's for over a month.  I do not think further imaging such  as CT or MRI are warranted at this time and at this point I think she can go back to her facility.  Vitals are normal. Final Clinical Impression(s) / ED Diagnoses Final diagnoses:  Hip pain    Rx / DC Orders ED Discharge Orders     None        Sherwood Gambler, MD 05/27/21 0002

## 2021-05-27 NOTE — ED Notes (Signed)
[facility] called for transport.  

## 2021-06-10 DEATH — deceased

## 2021-06-24 ENCOUNTER — Ambulatory Visit: Payer: No Typology Code available for payment source | Admitting: Podiatry

## 2021-09-10 ENCOUNTER — Other Ambulatory Visit: Payer: Medicare Other

## 2021-09-10 ENCOUNTER — Ambulatory Visit: Payer: Medicare Other | Admitting: Internal Medicine

## 2022-03-17 IMAGING — CT CT HEAD W/O CM
3 series · 15 of 47 positions shown, 18 images · non-contrast
Comparison: Head CT dated 12/27/2019.

CLINICAL DATA: 69-year-old female with head trauma.

EXAM:
CT HEAD WITHOUT CONTRAST
CT CERVICAL SPINE WITHOUT CONTRAST
TECHNIQUE: Multidetector CT imaging of the head and cervical spine was
performed following the standard protocol without intravenous
contrast. Multiplanar CT image reconstructions of the cervical spine
were also generated.

[Series 3: head wo · axial · 0.44mm/px · z∈[+296,+426]mm · 9 of 32 slices shown, 12 images]
[im 3/32  brain]
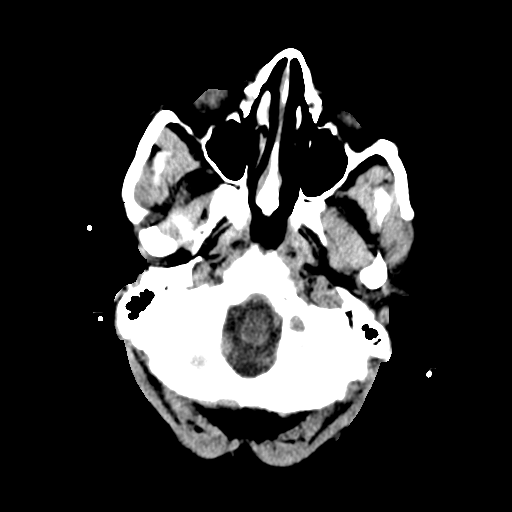
[im 3/32  bone]
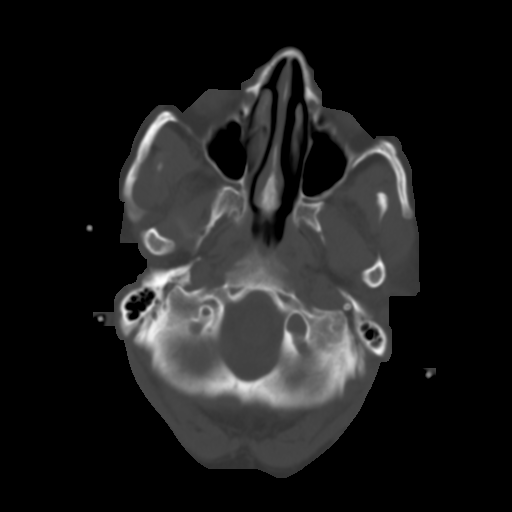
[im 6/32  brain]
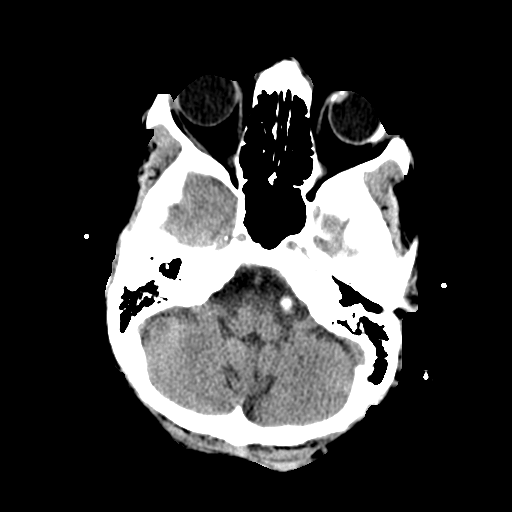
[im 9/32  brain]
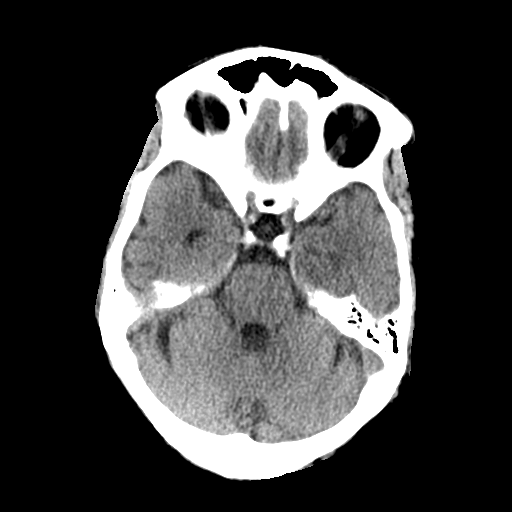
[im 12/32  brain]
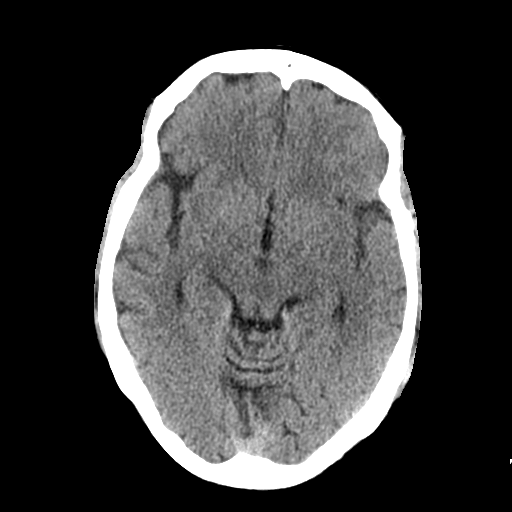
[im 17/32  brain]
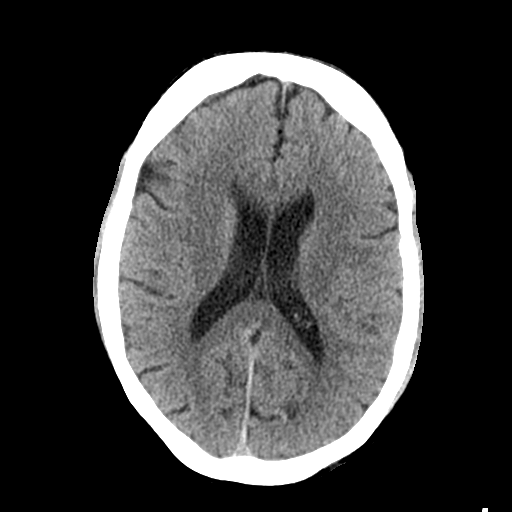
[im 17/32  bone]
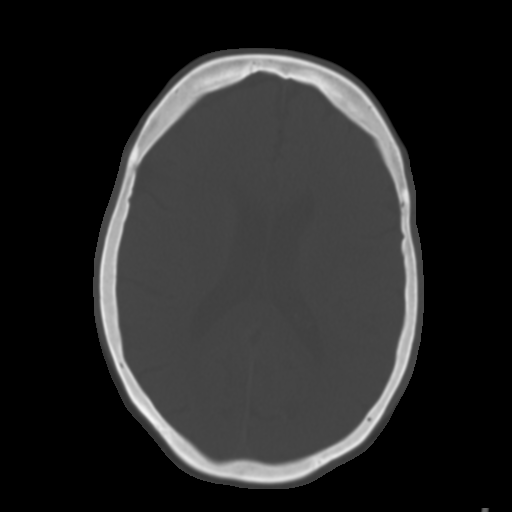
[im 20/32  brain]
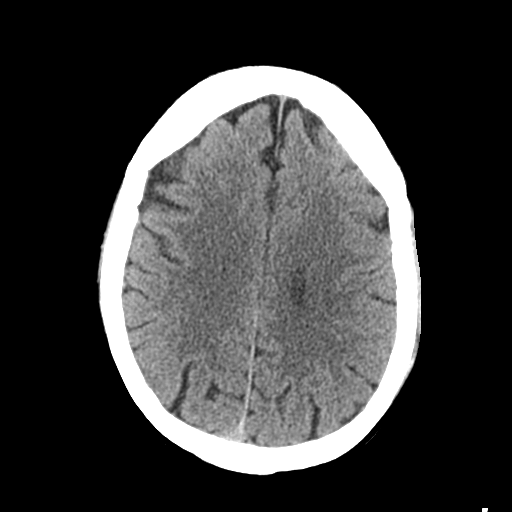
[im 23/32  brain]
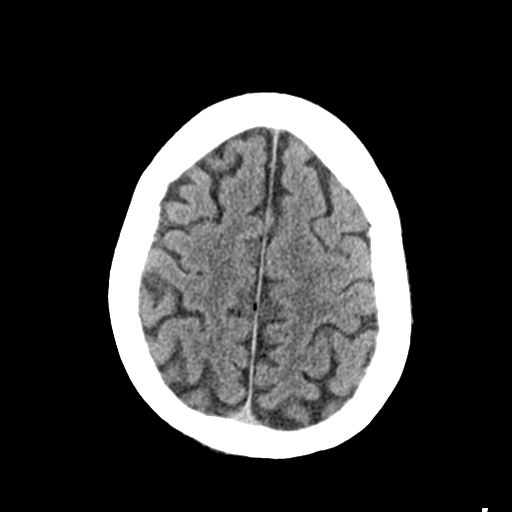
[im 26/32  brain]
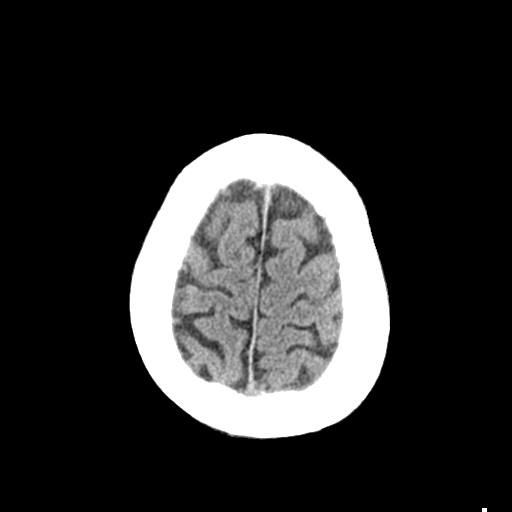
[im 29/32  brain]
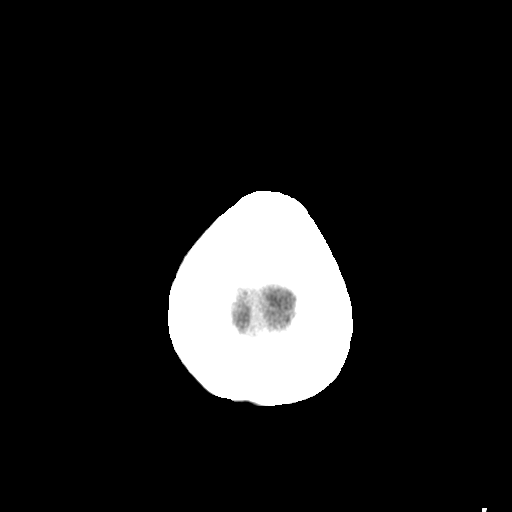
[im 29/32  bone]
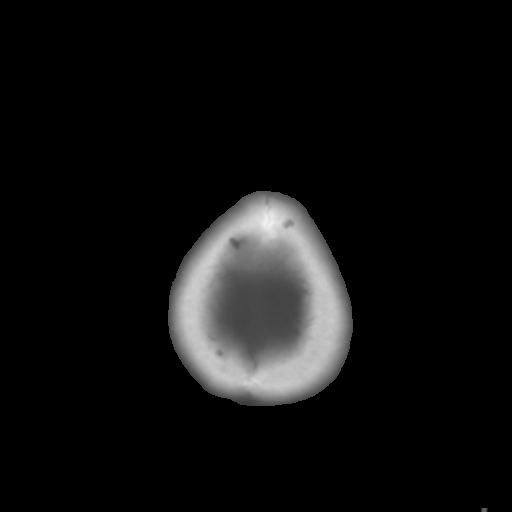

[Series 4: coronal soft tissue · coronal · 0.33mm/px · 3 of 67 slices shown]
[im 23/67  brain]
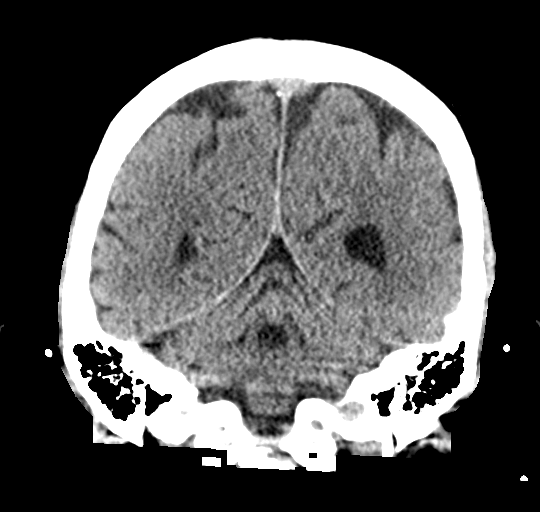
[im 30/67  brain]
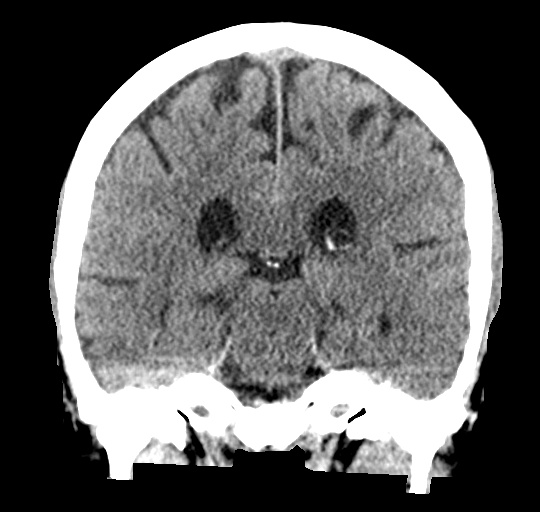
[im 37/67  brain]
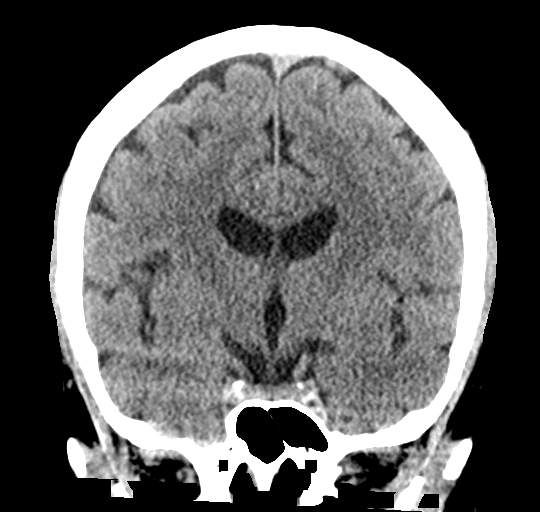

[Series 5: sagittal soft tissue · sagittal · 0.33mm/px · 3 of 54 slices shown]
[im 18/54  brain]
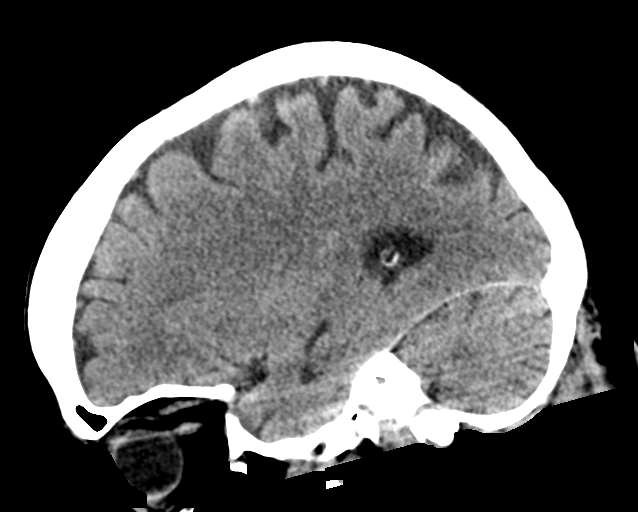
[im 27/54  brain]
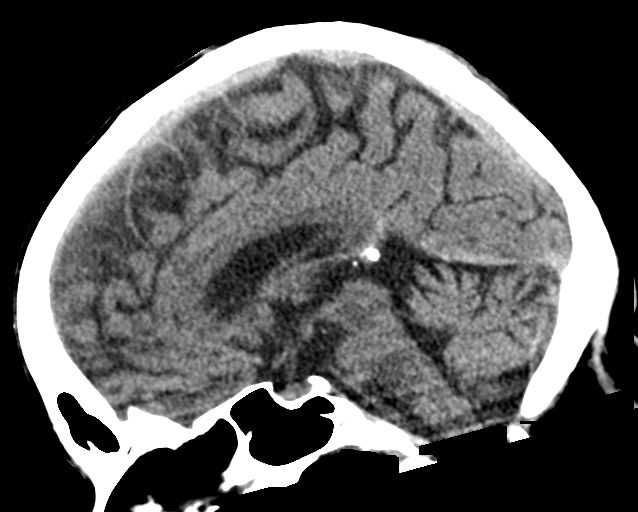
[im 36/54  brain]
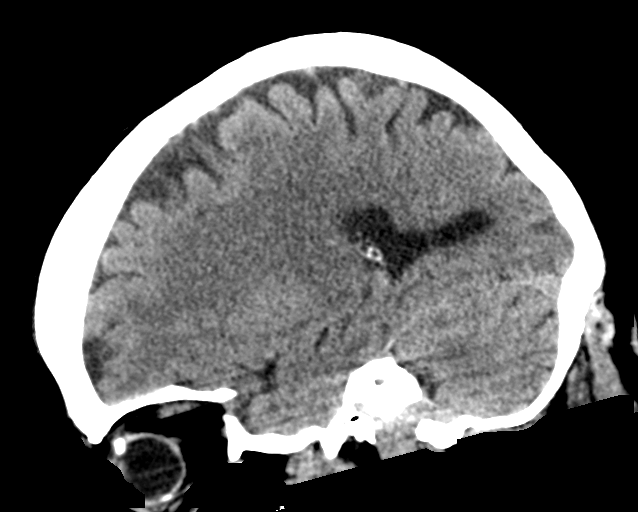

[15 of 47 positions shown; findings below may reference images not displayed]

FINDINGS: CT HEAD FINDINGS

Brain: The ventricles and sulci appropriate size for patient's age.
The gray-white matter discrimination is preserved. There is no acute
intracranial hemorrhage. No mass effect or midline shift no
extra-axial fluid collection.

Vascular: No hyperdense vessel or unexpected calcification.

Skull: Normal. Negative for fracture or focal lesion.

Sinuses/Orbits: No acute finding. Left scleral band.

Other: None

CT CERVICAL SPINE FINDINGS

Alignment: No acute subluxation.

Skull base and vertebrae: No acute fracture

Soft tissues and spinal canal: No prevertebral fluid or swelling. No
visible canal hematoma.

Disc levels:  Multilevel degenerative changes.

Upper chest: Negative.

Other: Heterogeneous thyroid with left nodule. Stability for greater
than 5 years implies benignity; no biopsy or followup indicated
(ref: [HOSPITAL]. [DATE]): 143-50).The reported
stability is based on thyroid ultrasound report of 07/27/2019.
Clinical correlation is recommended.
IMPRESSION: 1. No acute intracranial pathology.
2. No acute/traumatic cervical spine pathology.

## 2022-03-17 IMAGING — CT CT CERVICAL SPINE W/O CM
2 series · 11 of 27 positions shown, 14 images · non-contrast
Comparison: Head CT dated 12/27/2019.

CLINICAL DATA: 69-year-old female with head trauma.

EXAM:
CT HEAD WITHOUT CONTRAST
CT CERVICAL SPINE WITHOUT CONTRAST
TECHNIQUE: Multidetector CT imaging of the head and cervical spine was
performed following the standard protocol without intravenous
contrast. Multiplanar CT image reconstructions of the cervical spine
were also generated.

[Series 3: c spine soft · axial · 0.40mm/px · z∈[+187,+283]mm · 6 of 63 slices shown, 8 images]
[im 10/63  soft-tissue]
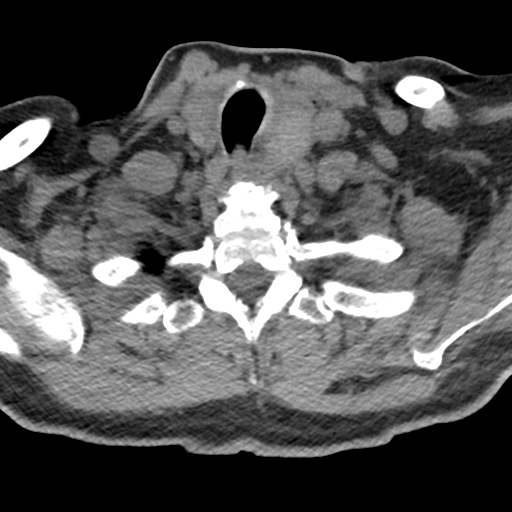
[im 10/63  bone]
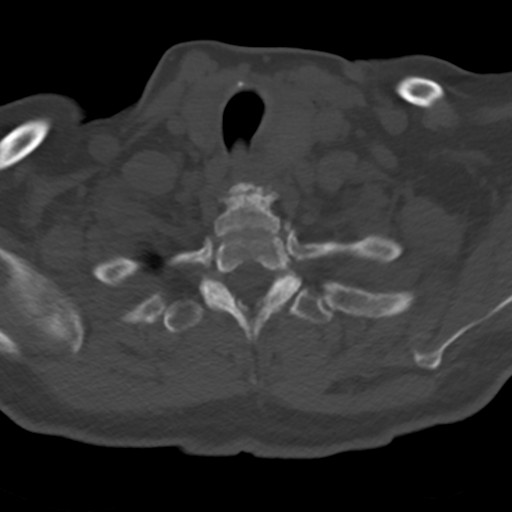
[im 20/63  bone]
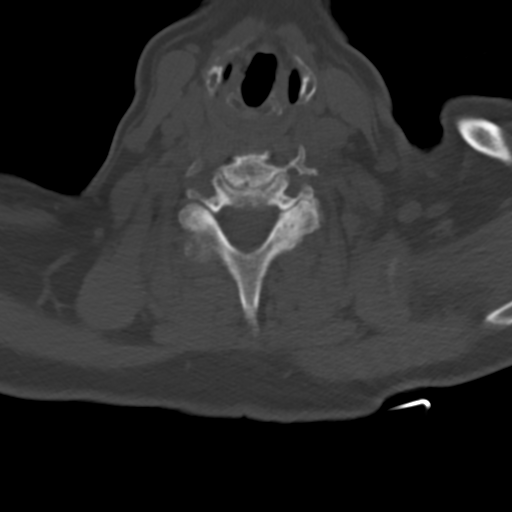
[im 29/63  bone]
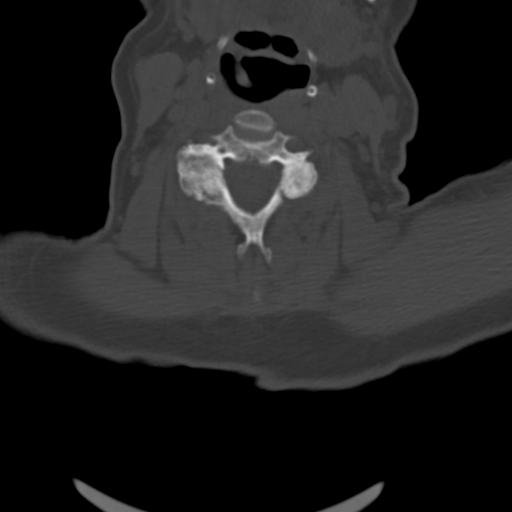
[im 39/63  bone]
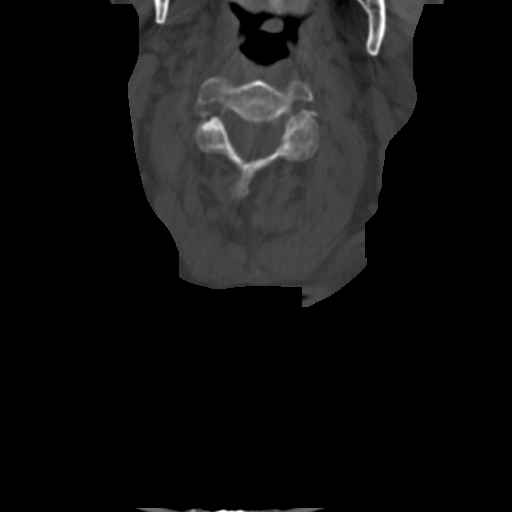
[im 48/63  soft-tissue]
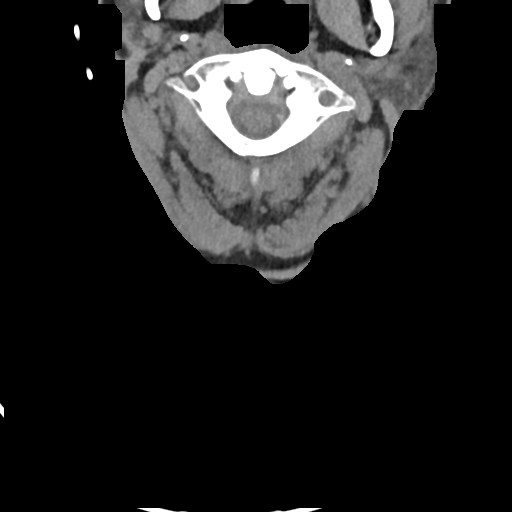
[im 48/63  bone]
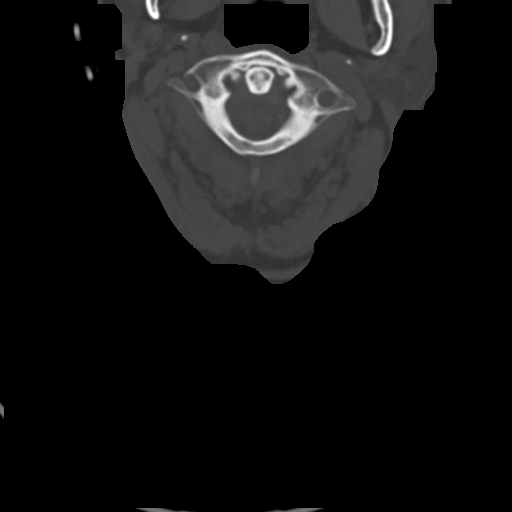
[im 58/63  bone]
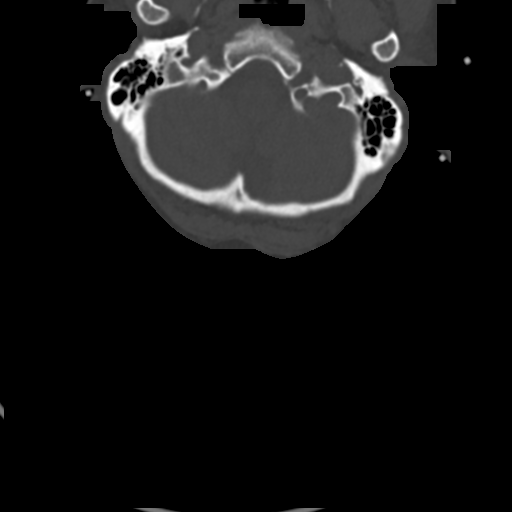

[Series 4: sagittal bone · sagittal · 0.18mm/px · 5 of 58 slices shown, 6 images]
[im 20/58  bone]
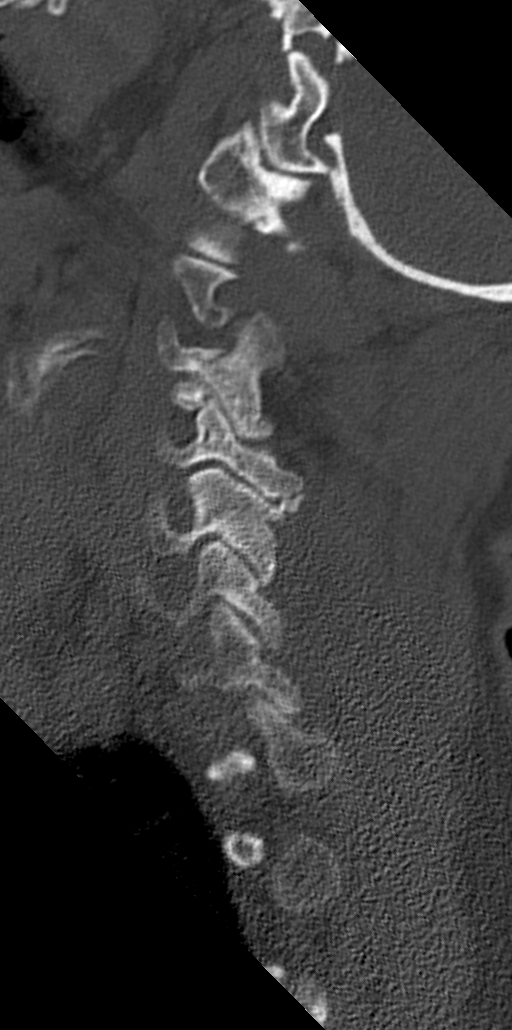
[im 24/58  bone]
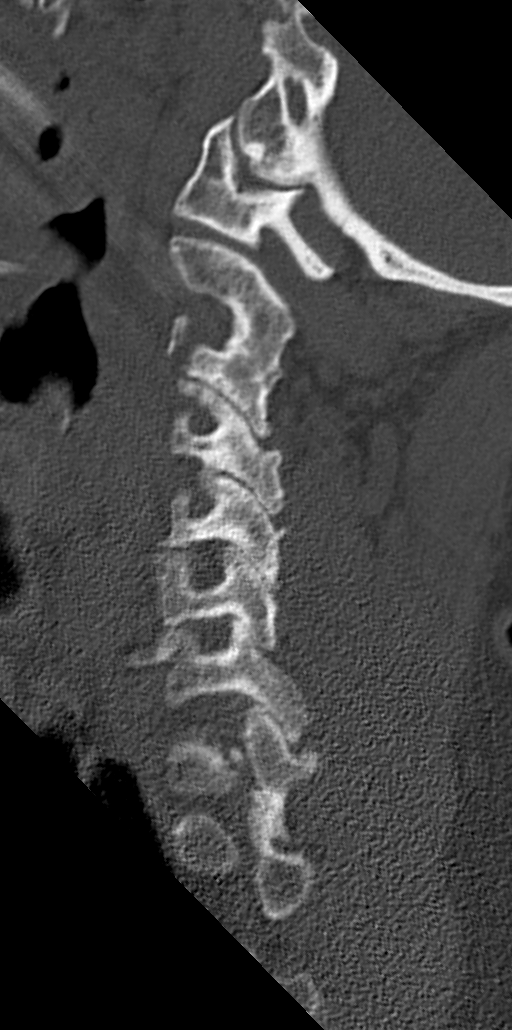
[im 29/58  soft-tissue]
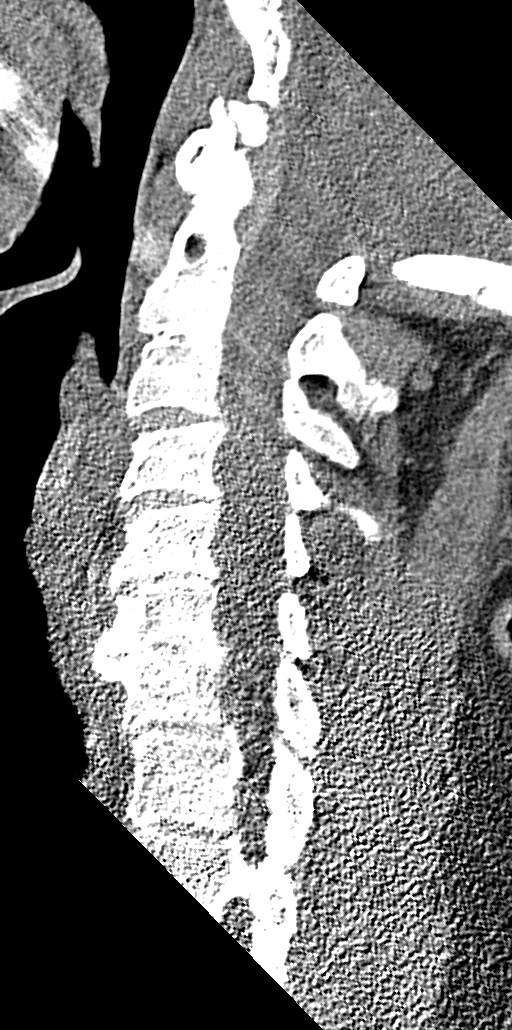
[im 29/58  bone]
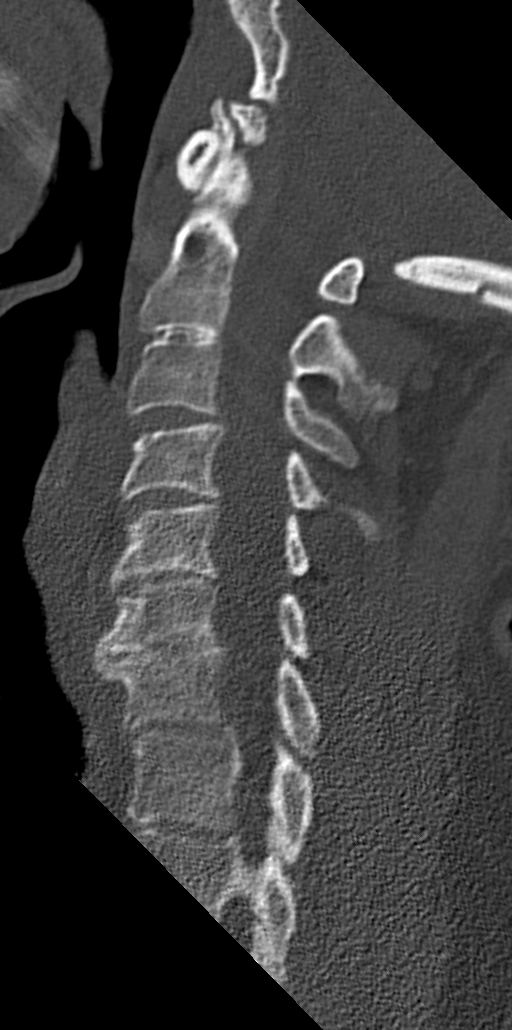
[im 34/58  bone]
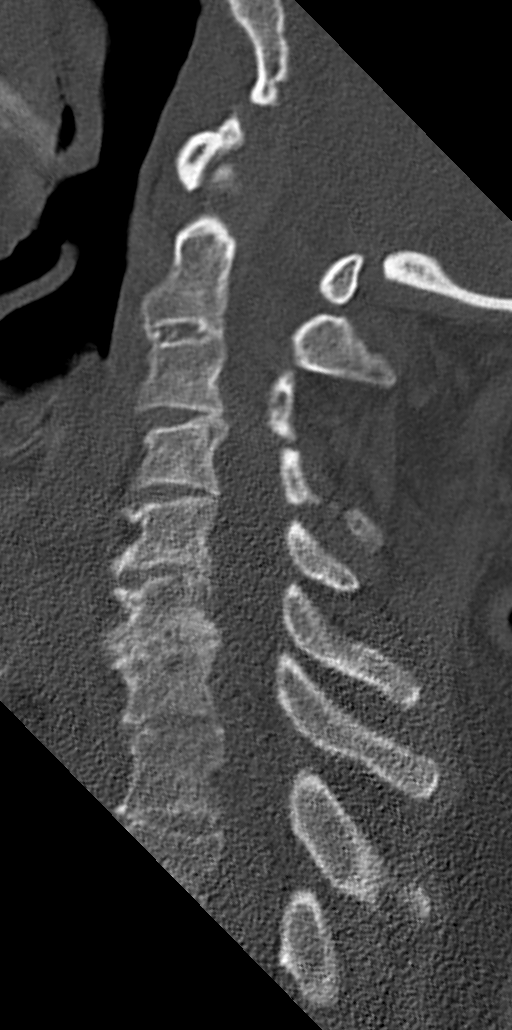
[im 39/58  bone]
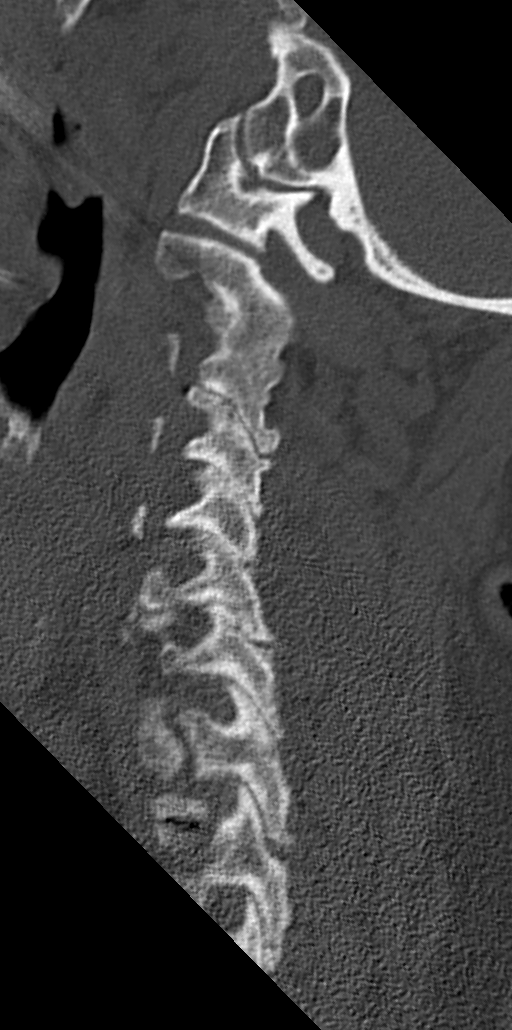

[11 of 27 positions shown; findings below may reference images not displayed]

FINDINGS: CT HEAD FINDINGS

Brain: The ventricles and sulci appropriate size for patient's age.
The gray-white matter discrimination is preserved. There is no acute
intracranial hemorrhage. No mass effect or midline shift no
extra-axial fluid collection.

Vascular: No hyperdense vessel or unexpected calcification.

Skull: Normal. Negative for fracture or focal lesion.

Sinuses/Orbits: No acute finding. Left scleral band.

Other: None

CT CERVICAL SPINE FINDINGS

Alignment: No acute subluxation.

Skull base and vertebrae: No acute fracture

Soft tissues and spinal canal: No prevertebral fluid or swelling. No
visible canal hematoma.

Disc levels:  Multilevel degenerative changes.

Upper chest: Negative.

Other: Heterogeneous thyroid with left nodule. Stability for greater
than 5 years implies benignity; no biopsy or followup indicated
(ref: [HOSPITAL]. [DATE]): 143-50).The reported
stability is based on thyroid ultrasound report of 07/27/2019.
Clinical correlation is recommended.
IMPRESSION: 1. No acute intracranial pathology.
2. No acute/traumatic cervical spine pathology.
